# Patient Record
Sex: Female | Born: 1955 | Race: White | Hispanic: No | Smoking: Current every day smoker
Health system: Southern US, Community
[De-identification: ages and names within clinical notes are randomized; demographics above are authoritative.]

## PROBLEM LIST (undated history)

## (undated) DIAGNOSIS — R112 Nausea with vomiting, unspecified: Secondary | ICD-10-CM

## (undated) DIAGNOSIS — G2581 Restless legs syndrome: Secondary | ICD-10-CM

## (undated) DIAGNOSIS — I639 Cerebral infarction, unspecified: Secondary | ICD-10-CM

## (undated) DIAGNOSIS — I10 Essential (primary) hypertension: Secondary | ICD-10-CM

## (undated) DIAGNOSIS — Z992 Dependence on renal dialysis: Secondary | ICD-10-CM

## (undated) DIAGNOSIS — J9611 Chronic respiratory failure with hypoxia: Secondary | ICD-10-CM

## (undated) DIAGNOSIS — Z9889 Other specified postprocedural states: Secondary | ICD-10-CM

## (undated) DIAGNOSIS — D649 Anemia, unspecified: Secondary | ICD-10-CM

## (undated) DIAGNOSIS — E119 Type 2 diabetes mellitus without complications: Secondary | ICD-10-CM

## (undated) DIAGNOSIS — I739 Peripheral vascular disease, unspecified: Secondary | ICD-10-CM

## (undated) DIAGNOSIS — N189 Chronic kidney disease, unspecified: Secondary | ICD-10-CM

## (undated) DIAGNOSIS — I2510 Atherosclerotic heart disease of native coronary artery without angina pectoris: Secondary | ICD-10-CM

## (undated) DIAGNOSIS — N186 End stage renal disease: Secondary | ICD-10-CM

## (undated) DIAGNOSIS — E785 Hyperlipidemia, unspecified: Secondary | ICD-10-CM

## (undated) HISTORY — PX: WRIST SURGERY: SHX841

## (undated) HISTORY — PX: TONSILLECTOMY: SUR1361

## (undated) HISTORY — DX: Chronic kidney disease, unspecified: N18.9

## (undated) HISTORY — PX: UPPER GI ENDOSCOPY: SHX6162

---

## 1997-11-04 ENCOUNTER — Other Ambulatory Visit: Admission: RE | Admit: 1997-11-04 | Discharge: 1997-11-04 | Payer: Self-pay | Admitting: Obstetrics and Gynecology

## 1997-11-10 ENCOUNTER — Ambulatory Visit (HOSPITAL_COMMUNITY): Admission: RE | Admit: 1997-11-10 | Discharge: 1997-11-10 | Payer: Self-pay | Admitting: Obstetrics and Gynecology

## 2001-11-07 ENCOUNTER — Encounter: Payer: Self-pay | Admitting: Family Medicine

## 2001-11-07 ENCOUNTER — Ambulatory Visit (HOSPITAL_COMMUNITY): Admission: RE | Admit: 2001-11-07 | Discharge: 2001-11-07 | Payer: Self-pay | Admitting: Family Medicine

## 2009-04-28 ENCOUNTER — Emergency Department (HOSPITAL_COMMUNITY): Admission: EM | Admit: 2009-04-28 | Discharge: 2009-04-29 | Payer: Self-pay | Admitting: Emergency Medicine

## 2009-04-29 ENCOUNTER — Encounter (INDEPENDENT_AMBULATORY_CARE_PROVIDER_SITE_OTHER): Payer: Self-pay | Admitting: Emergency Medicine

## 2009-04-29 ENCOUNTER — Ambulatory Visit: Payer: Self-pay | Admitting: Vascular Surgery

## 2009-04-29 ENCOUNTER — Ambulatory Visit (HOSPITAL_COMMUNITY): Admission: RE | Admit: 2009-04-29 | Discharge: 2009-04-29 | Payer: Self-pay | Admitting: Emergency Medicine

## 2010-04-21 ENCOUNTER — Emergency Department (HOSPITAL_COMMUNITY): Admission: EM | Admit: 2010-04-21 | Discharge: 2010-04-21 | Payer: Self-pay | Admitting: Emergency Medicine

## 2010-05-25 ENCOUNTER — Ambulatory Visit (HOSPITAL_COMMUNITY): Admission: RE | Admit: 2010-05-25 | Discharge: 2010-05-26 | Payer: Self-pay | Admitting: Otolaryngology

## 2010-05-25 ENCOUNTER — Encounter (INDEPENDENT_AMBULATORY_CARE_PROVIDER_SITE_OTHER): Payer: Self-pay | Admitting: Otolaryngology

## 2010-11-22 LAB — COMPREHENSIVE METABOLIC PANEL WITH GFR
ALT: 17 U/L (ref 0–35)
AST: 19 U/L (ref 0–37)
Albumin: 3.8 g/dL (ref 3.5–5.2)
Alkaline Phosphatase: 125 U/L — ABNORMAL HIGH (ref 39–117)
BUN: 15 mg/dL (ref 6–23)
CO2: 26 meq/L (ref 19–32)
Calcium: 9.1 mg/dL (ref 8.4–10.5)
Chloride: 100 meq/L (ref 96–112)
Creatinine, Ser: 0.85 mg/dL (ref 0.4–1.2)
GFR calc non Af Amer: >60 mL/min
Glucose, Bld: 195 mg/dL — ABNORMAL HIGH (ref 70–99)
Potassium: 3.6 meq/L (ref 3.5–5.1)
Sodium: 135 meq/L (ref 135–145)
Total Bilirubin: 0.4 mg/dL (ref 0.3–1.2)
Total Protein: 7.1 g/dL (ref 6.0–8.3)

## 2010-11-22 LAB — URINALYSIS, ROUTINE W REFLEX MICROSCOPIC
Bilirubin Urine: NEGATIVE
Glucose, UA: NEGATIVE mg/dL
Ketones, ur: NEGATIVE mg/dL
Nitrite: NEGATIVE
Protein, ur: NEGATIVE mg/dL
Specific Gravity, Urine: 1.025 (ref 1.005–1.030)
Urobilinogen, UA: 0.2 mg/dL (ref 0.0–1.0)
pH: 5.5 (ref 5.0–8.0)

## 2010-11-22 LAB — CBC
HCT: 42.8 % (ref 36.0–46.0)
Hemoglobin: 14.7 g/dL (ref 12.0–15.0)
MCH: 29.4 pg (ref 26.0–34.0)
MCHC: 34.3 g/dL (ref 30.0–36.0)
MCV: 85.6 fL (ref 78.0–100.0)
Platelets: 262 10*3/uL (ref 150–400)
RBC: 5 MIL/uL (ref 3.87–5.11)
RDW: 14.1 % (ref 11.5–15.5)
WBC: 14.3 10*3/uL — ABNORMAL HIGH (ref 4.0–10.5)

## 2010-11-22 LAB — SURGICAL PCR SCREEN
MRSA, PCR: NEGATIVE
Staphylococcus aureus: NEGATIVE

## 2010-11-22 LAB — URINE MICROSCOPIC-ADD ON

## 2010-12-15 LAB — BASIC METABOLIC PANEL
GFR calc Af Amer: >60 mL/min (ref >60)
GFR calc non Af Amer: >60 mL/min (ref >60)
Glucose, Bld: 180 mg/dL — ABNORMAL HIGH (ref 70–99)
Potassium: 3.7 mEq/L (ref 3.5–5.1)
Sodium: 137 mEq/L (ref 135–145)

## 2010-12-15 LAB — POCT CARDIAC MARKERS
Myoglobin, poc: 38.5 ng/mL (ref 12–200)
Troponin i, poc: <0.05 ng/mL (ref 0.00–0.09)

## 2010-12-15 LAB — DIFFERENTIAL
Eosinophils Relative: 2 % (ref 0–5)
Lymphocytes Relative: 22 % (ref 12–46)
Lymphs Abs: 2.4 10*3/uL (ref 0.7–4.0)
Monocytes Absolute: 0.6 10*3/uL (ref 0.1–1.0)

## 2010-12-15 LAB — CBC
HCT: 39.2 % (ref 36.0–46.0)
Hemoglobin: 13.3 g/dL (ref 12.0–15.0)
RBC: 4.47 MIL/uL (ref 3.87–5.11)
WBC: 11.2 10*3/uL — ABNORMAL HIGH (ref 4.0–10.5)

## 2010-12-15 LAB — APTT: aPTT: 26 seconds (ref 24–37)

## 2010-12-19 ENCOUNTER — Inpatient Hospital Stay (INDEPENDENT_AMBULATORY_CARE_PROVIDER_SITE_OTHER)
Admission: RE | Admit: 2010-12-19 | Discharge: 2010-12-19 | Disposition: A | Discharge status: Home / Self Care | Payer: Self-pay | Source: Ambulatory Visit | Attending: Emergency Medicine | Admitting: Emergency Medicine

## 2010-12-19 DIAGNOSIS — I10 Essential (primary) hypertension: Secondary | ICD-10-CM

## 2013-10-22 ENCOUNTER — Encounter (HOSPITAL_COMMUNITY): Payer: Self-pay | Admitting: Emergency Medicine

## 2013-10-22 ENCOUNTER — Inpatient Hospital Stay (HOSPITAL_COMMUNITY)
Admission: EM | Admit: 2013-10-22 | Discharge: 2013-10-25 | DRG: 062 | Disposition: A | Discharge status: Home / Self Care | Payer: Self-pay | Attending: Neurology | Admitting: Neurology

## 2013-10-22 ENCOUNTER — Emergency Department (HOSPITAL_COMMUNITY): Payer: Self-pay

## 2013-10-22 ENCOUNTER — Inpatient Hospital Stay (HOSPITAL_COMMUNITY): Payer: Self-pay

## 2013-10-22 DIAGNOSIS — I6789 Other cerebrovascular disease: Secondary | ICD-10-CM | POA: Diagnosis present

## 2013-10-22 DIAGNOSIS — E119 Type 2 diabetes mellitus without complications: Secondary | ICD-10-CM | POA: Diagnosis present

## 2013-10-22 DIAGNOSIS — R29810 Facial weakness: Secondary | ICD-10-CM | POA: Diagnosis present

## 2013-10-22 DIAGNOSIS — Z8249 Family history of ischemic heart disease and other diseases of the circulatory system: Secondary | ICD-10-CM

## 2013-10-22 DIAGNOSIS — I6350 Cerebral infarction due to unspecified occlusion or stenosis of unspecified cerebral artery: Secondary | ICD-10-CM

## 2013-10-22 DIAGNOSIS — Z823 Family history of stroke: Secondary | ICD-10-CM

## 2013-10-22 DIAGNOSIS — G8190 Hemiplegia, unspecified affecting unspecified side: Secondary | ICD-10-CM | POA: Diagnosis present

## 2013-10-22 DIAGNOSIS — R739 Hyperglycemia, unspecified: Secondary | ICD-10-CM

## 2013-10-22 DIAGNOSIS — E785 Hyperlipidemia, unspecified: Secondary | ICD-10-CM | POA: Diagnosis present

## 2013-10-22 DIAGNOSIS — I6330 Cerebral infarction due to thrombosis of unspecified cerebral artery: Principal | ICD-10-CM | POA: Diagnosis present

## 2013-10-22 DIAGNOSIS — Z8673 Personal history of transient ischemic attack (TIA), and cerebral infarction without residual deficits: Secondary | ICD-10-CM | POA: Diagnosis present

## 2013-10-22 DIAGNOSIS — R471 Dysarthria and anarthria: Secondary | ICD-10-CM | POA: Diagnosis present

## 2013-10-22 DIAGNOSIS — I639 Cerebral infarction, unspecified: Secondary | ICD-10-CM

## 2013-10-22 DIAGNOSIS — F17200 Nicotine dependence, unspecified, uncomplicated: Secondary | ICD-10-CM | POA: Diagnosis present

## 2013-10-22 DIAGNOSIS — I10 Essential (primary) hypertension: Secondary | ICD-10-CM

## 2013-10-22 HISTORY — DX: Essential (primary) hypertension: I10

## 2013-10-22 LAB — COMPREHENSIVE METABOLIC PANEL
ALBUMIN: 3.6 g/dL (ref 3.5–5.2)
ALK PHOS: 133 U/L — AB (ref 39–117)
ALT: 13 U/L (ref 0–35)
AST: 14 U/L (ref 0–37)
BUN: 17 mg/dL (ref 6–23)
CHLORIDE: 98 meq/L (ref 96–112)
CO2: 22 mEq/L (ref 19–32)
Calcium: 9.3 mg/dL (ref 8.4–10.5)
Creatinine, Ser: 0.95 mg/dL (ref 0.50–1.10)
GFR calc Af Amer: 76 mL/min — ABNORMAL LOW (ref >90)
GFR calc non Af Amer: 66 mL/min — ABNORMAL LOW (ref >90)
GLUCOSE: 216 mg/dL — AB (ref 70–99)
POTASSIUM: 3.9 meq/L (ref 3.7–5.3)
SODIUM: 138 meq/L (ref 137–147)
TOTAL PROTEIN: 7.8 g/dL (ref 6.0–8.3)
Total Bilirubin: 0.3 mg/dL (ref 0.3–1.2)

## 2013-10-22 LAB — URINALYSIS, ROUTINE W REFLEX MICROSCOPIC
BILIRUBIN URINE: NEGATIVE
Glucose, UA: NEGATIVE mg/dL
KETONES UR: NEGATIVE mg/dL
NITRITE: NEGATIVE
Protein, ur: 100 mg/dL — AB
SPECIFIC GRAVITY, URINE: 1.013 (ref 1.005–1.030)
UROBILINOGEN UA: 1 mg/dL (ref 0.0–1.0)
pH: 7 (ref 5.0–8.0)

## 2013-10-22 LAB — ETHANOL: Alcohol, Ethyl (B): <11 mg/dL (ref 0–11)

## 2013-10-22 LAB — CBC
HCT: 41.6 % (ref 36.0–46.0)
Hemoglobin: 14.5 g/dL (ref 12.0–15.0)
MCH: 29.7 pg (ref 26.0–34.0)
MCHC: 34.9 g/dL (ref 30.0–36.0)
MCV: 85.1 fL (ref 78.0–100.0)
PLATELETS: 280 10*3/uL (ref 150–400)
RBC: 4.89 MIL/uL (ref 3.87–5.11)
RDW: 13.3 % (ref 11.5–15.5)
WBC: 13.1 10*3/uL — ABNORMAL HIGH (ref 4.0–10.5)

## 2013-10-22 LAB — GLUCOSE, CAPILLARY: Glucose-Capillary: 213 mg/dL — ABNORMAL HIGH (ref 70–99)

## 2013-10-22 LAB — DIFFERENTIAL
BASOS PCT: 0 % (ref 0–1)
Basophils Absolute: 0 10*3/uL (ref 0.0–0.1)
EOS ABS: 0.1 10*3/uL (ref 0.0–0.7)
Eosinophils Relative: 1 % (ref 0–5)
LYMPHS ABS: 2.4 10*3/uL (ref 0.7–4.0)
Lymphocytes Relative: 18 % (ref 12–46)
Monocytes Absolute: 0.7 10*3/uL (ref 0.1–1.0)
Monocytes Relative: 5 % (ref 3–12)
NEUTROS ABS: 9.8 10*3/uL — AB (ref 1.7–7.7)
NEUTROS PCT: 75 % (ref 43–77)

## 2013-10-22 LAB — POCT I-STAT TROPONIN I: Troponin i, poc: 0 ng/mL (ref 0.00–0.08)

## 2013-10-22 LAB — POCT I-STAT, CHEM 8
BUN: 20 mg/dL (ref 6–23)
CREATININE: 1 mg/dL (ref 0.50–1.10)
Calcium, Ion: 1.13 mmol/L (ref 1.12–1.23)
Chloride: 102 mEq/L (ref 96–112)
GLUCOSE: 214 mg/dL — AB (ref 70–99)
HCT: 44 % (ref 36.0–46.0)
HEMOGLOBIN: 15 g/dL (ref 12.0–15.0)
Potassium: 3.8 mEq/L (ref 3.7–5.3)
SODIUM: 140 meq/L (ref 137–147)
TCO2: 26 mmol/L (ref 0–100)

## 2013-10-22 LAB — URINE MICROSCOPIC-ADD ON

## 2013-10-22 LAB — PROTIME-INR
INR: 0.92 (ref 0.00–1.49)
PROTHROMBIN TIME: 12.2 s (ref 11.6–15.2)

## 2013-10-22 LAB — RAPID URINE DRUG SCREEN, HOSP PERFORMED
Amphetamines: NOT DETECTED
Barbiturates: NOT DETECTED
Benzodiazepines: NOT DETECTED
COCAINE: NOT DETECTED
Opiates: NOT DETECTED
Tetrahydrocannabinol: POSITIVE — AB

## 2013-10-22 LAB — MRSA PCR SCREENING: MRSA BY PCR: NEGATIVE

## 2013-10-22 LAB — APTT: APTT: 29 s (ref 24–37)

## 2013-10-22 LAB — TROPONIN I: Troponin I: <0.3 ng/mL (ref <0.30)

## 2013-10-22 MED ORDER — LABETALOL HCL 5 MG/ML IV SOLN
10.0000 mg | INTRAVENOUS | Status: DC | PRN
  Administered 2013-10-22 – 2013-10-25 (×5): 10 mg via INTRAVENOUS
  Filled 2013-10-22 (×5): qty 4

## 2013-10-22 MED ORDER — PANTOPRAZOLE SODIUM 40 MG IV SOLR
40.0000 mg | Freq: Every day | INTRAVENOUS | Status: DC
Start: 2013-10-22 — End: 2013-10-24
  Administered 2013-10-22 – 2013-10-23 (×2): 40 mg via INTRAVENOUS
  Filled 2013-10-22 (×3): qty 40

## 2013-10-22 MED ORDER — ACETAMINOPHEN 650 MG RE SUPP: 650.0000 mg | RECTAL | Status: DC | PRN

## 2013-10-22 MED ORDER — ACETAMINOPHEN 325 MG PO TABS
650.0000 mg | ORAL_TABLET | ORAL | Status: DC | PRN
  Administered 2013-10-23: 650 mg via ORAL
  Filled 2013-10-22: qty 2

## 2013-10-22 MED ORDER — ALTEPLASE (STROKE) FULL DOSE INFUSION
90.0000 mg | Freq: Once | INTRAVENOUS | Status: AC
  Administered 2013-10-22: 90 mg via INTRAVENOUS
  Filled 2013-10-22: qty 90

## 2013-10-22 MED ORDER — SODIUM CHLORIDE 0.9 % IV SOLN
INTRAVENOUS | Status: DC
  Administered 2013-10-22 – 2013-10-23 (×2): via INTRAVENOUS

## 2013-10-22 MED ORDER — DIPHENHYDRAMINE HCL 50 MG/ML IJ SOLN: 12.5000 mg | Freq: Four times a day (QID) | INTRAMUSCULAR | Status: DC | PRN

## 2013-10-22 MED ORDER — SENNOSIDES-DOCUSATE SODIUM 8.6-50 MG PO TABS
1.0000 | ORAL_TABLET | Freq: Every evening | ORAL | Status: DC | PRN
  Filled 2013-10-22: qty 1

## 2013-10-22 MED ORDER — PNEUMOCOCCAL VAC POLYVALENT 25 MCG/0.5ML IJ INJ
0.5000 mL | INJECTION | INTRAMUSCULAR | Status: AC
  Administered 2013-10-23: 0.5 mL via INTRAMUSCULAR
  Filled 2013-10-22: qty 0.5

## 2013-10-22 MED ORDER — LABETALOL HCL 5 MG/ML IV SOLN
20.0000 mg | Freq: Once | INTRAVENOUS | Status: AC
  Administered 2013-10-22: 20 mg via INTRAVENOUS
  Filled 2013-10-22: qty 4

## 2013-10-22 MED ORDER — DIPHENHYDRAMINE HCL 50 MG/ML IJ SOLN
12.5000 mg | Freq: Four times a day (QID) | INTRAMUSCULAR | Status: DC | PRN
  Administered 2013-10-22: 12.5 mg via INTRAVENOUS
  Filled 2013-10-22: qty 1

## 2013-10-22 MED ORDER — NICARDIPINE HCL IN NACL 20-0.86 MG/200ML-% IV SOLN
3.0000 mg/h | INTRAVENOUS | Status: DC
  Administered 2013-10-23: 3 mg/h via INTRAVENOUS
  Administered 2013-10-23: 5 mg/h via INTRAVENOUS
  Administered 2013-10-24: 2 mg/h via INTRAVENOUS
  Filled 2013-10-22 (×3): qty 200

## 2013-10-22 NOTE — ED Notes (Signed)
Per ems, pt BP was 240/120. No HX of stroke or TIA

## 2013-10-22 NOTE — H&P (Signed)
Referring Physician: Ward    Chief Complaint: Code Stroke  HPI:                                                                                                                                         Tina Gomez is an 58 y.o. female who has not seen a medical MD for over one year.  Today she was sitting in her living room when she tries to write something down. She noted her writing was scribbled and then noted a fullness sensation in her right face, blurred vision and abnormal speech.  Family members arrived at the house and noted her speech was abnormal and she had a facial droop.  EMS was called and code stroke was called.  En route her BP was 240/119, BG 210.  On arrival BP remained elevated at 214/109, she continued to have dysarthria, right arm drift , decreased sensation in right arm and face.   Date last known well: Date: 10/22/2013 Time last known well: Time: 13:00 tPA Given: Yes NIHSS 6  Past Medical History  Diagnosis Date  . Hypertension     Past Surgical History  Procedure Laterality Date  . Cesarean section      Family History  Problem Relation Age of Onset  . Stroke Mother   . Hypertension Mother   . Hypertension Father   . Hyperlipidemia Mother   . Hyperlipidemia Father    Social History:  reports that she has been smoking Cigarettes.  She has a 10 pack-year smoking history. She does not have any smokeless tobacco history on file. She reports that she does not drink alcohol. Her drug history is not on file.  Allergies:  Allergies  Allergen Reactions  . Codeine     Medications:                                                                                                                          none  ROS:  History obtained from the patient  General ROS: negative for - chills, fatigue, fever, night sweats, weight gain or  weight loss Psychological ROS: negative for - behavioral disorder, hallucinations, memory difficulties, mood swings or suicidal ideation Ophthalmic ROS: negative for - blurry vision, double vision, eye pain or loss of vision ENT ROS: negative for - epistaxis, nasal discharge, oral lesions, sore throat, tinnitus or vertigo Allergy and Immunology ROS: negative for - hives or itchy/watery eyes Hematological and Lymphatic ROS: negative for - bleeding problems, bruising or swollen lymph nodes Endocrine ROS: negative for - galactorrhea, hair pattern changes, polydipsia/polyuria or temperature intolerance Respiratory ROS: negative for - cough, hemoptysis, shortness of breath or wheezing Cardiovascular ROS: negative for - chest pain, dyspnea on exertion, edema or irregular heartbeat Gastrointestinal ROS: negative for - abdominal pain, diarrhea, hematemesis, nausea/vomiting or stool incontinence Genito-Urinary ROS: negative for - dysuria, hematuria, incontinence or urinary frequency/urgency Musculoskeletal ROS: negative for - joint swelling or muscular weakness Neurological ROS: as noted in HPI Dermatological ROS: negative for rash and skin lesion changes  General Exam: CV-RRR S1,S2 Skin: WDI ABD: BSX4 quadrants Lungs: CTA  Neurologic Examination:                                                                                                      Blood pressure 177/86, pulse 68, resp. rate 17, height 5\' 3"  (1.6 m), weight 106.595 kg (235 lb), SpO2 94.00%.   Mental Status: Alert, oriented, thought content appropriate.  Speech dysarthric without evidence of aphasia.  Able to follow 3 step commands without difficulty. Cranial Nerves: II: Discs flat bilaterally; Visual fields grossly normal, pupils equal, round, reactive to light and accommodation III,IV, VI: ptosis present in right eye, extra-ocular motions intact but not conjugate V,VII: mild right facial droop, facial light touch sensation  decreased on the right VIII: hearing normal bilaterally IX,X: gag reflex present XI: bilateral shoulder shrug XII: midline tongue extension without atrophy or fasciculations Motor: Right : Upper extremity   4/5    Left:     Upper extremity   5/5  Lower extremity   4/5     Lower extremity   5/5 --drift in right arm and leg Tone and bulk:normal tone throughout; no atrophy noted Sensory:  Decreased in right leg and arm Deep Tendon Reflexes:  Right: Upper Extremity   Left: Upper extremity   biceps (C-5 to C-6) 2/4   biceps (C-5 to C-6) 2/4 tricep (C7) 2/4    triceps (C7) 2/4 Brachioradialis (C6) 2/4  Brachioradialis (C6) 2/4  Lower Extremity Lower Extremity  quadriceps (L-2 to L-4) 2/4   quadriceps (L-2 to L-4) 2/4 Achilles (S1) 1/4   Achilles (S1) 1/4 Plantars: Right: downgoing   Left: downgoing Cerebellar: normal finger-to-nose,  normal heel-to-shin test Gait: not tested CV: pulses palpable throughout    Lab Results: Basic Metabolic Panel:  Recent Labs Lab 10/22/13 1614  NA 140  K 3.8  CL 102  GLUCOSE 214*  BUN 20  CREATININE 1.00    Liver Function Tests: No results found for this basename: AST, ALT, ALKPHOS,  BILITOT, PROT, ALBUMIN,  in the last 168 hours No results found for this basename: LIPASE, AMYLASE,  in the last 168 hours No results found for this basename: AMMONIA,  in the last 168 hours  CBC:  Recent Labs Lab 10/22/13 1554 10/22/13 1614  WBC 13.1*  --   NEUTROABS 9.8*  --   HGB 14.5 15.0  HCT 41.6 44.0  MCV 85.1  --   PLT 280  --     Cardiac Enzymes: No results found for this basename: CKTOTAL, CKMB, CKMBINDEX, TROPONINI,  in the last 168 hours  Lipid Panel: No results found for this basename: CHOL, TRIG, HDL, CHOLHDL, VLDL, LDLCALC,  in the last 168 hours  CBG:  Recent Labs Lab 10/22/13 [place]*    Microbiology: Results for orders placed during the hospital encounter of 05/25/10  SURGICAL PCR SCREEN     Status: None    Collection Time    05/23/10  3:30 PM      Result Value Ref Range Status   MRSA, PCR NEGATIVE  NEGATIVE Final   Staphylococcus aureus    NEGATIVE Final   Value: NEGATIVE            The Xpert SA Assay (FDA     approved for NASAL specimens     only), is one component of     a comprehensive surveillance     program.  It is not intended     to diagnose infection nor to     guide or monitor treatment.    Coagulation Studies:  Recent Labs  10/22/13 1554  LABPROT 12.2  INR 0.92    Imaging: Ct Head Wo Contrast  10/22/2013   CLINICAL DATA:  Code stroke, sudden onset of right-sided facial droop and arm dressed  EXAM: CT HEAD WITHOUT CONTRAST  TECHNIQUE: Contiguous axial images were obtained from the base of the skull through the vertex without intravenous contrast.  COMPARISON:  None.  FINDINGS: Scattered very minimal periventricular hypodensities suggestive of microvascular ischemic disease. The gray-white differentiation is otherwise well maintained without CT evidence of acute large territory infarct. No intraparenchymal or extra-axial mass or hemorrhage. Normal size and configuration of the ventricles and basilar cisterns. No midline shift. Minimal intracranial atherosclerosis. Limited visualization of the paranasal sinuses and mastoid air cells are normal. Regional soft tissues are normal. No displaced sternal fracture.  IMPRESSION: Minimal microvascular ischemic disease without acute intracranial process.  Critical Value/emergent results were called by telephone at the time of interpretation on 10/22/2013 at 3:46 PM to Dr. Doy Mince, who verbally acknowledged these results.   Electronically Signed   By: Sandi Mariscal M.D.   On: 10/22/2013 15:53    Etta Quill PA-C [facility] [phone]  10/22/2013, 4:44 PM   Patient seen and examined.  Clinical course and management discussed.  Necessary edits performed.  I agree with the above.  Assessment and plan of care developed and  discussed below.     Assessment: 58 y.o. female presenting with complaints of slurred speech and right hemiparesis.  Patient with a history of hypertension on no antihypertensives.  BP elevated on presentation.  Head CT reviewed and shows no evidence of acute changes.  Patient with an NIHSS of 6.  Symptoms affect right and patient is right handed.  Option of tPA discussed along with possible risks and benefits.  Verbal consent obtained.  tPA administered after control of BP.    Stroke Risk Factors - hypertension and smoking  1. HgbA1c,  fasting lipid panel 2. MRI, MRA  of the brain without contrast 3. PT consult, OT consult, Speech consult 4. Echocardiogram 5. Carotid dopplers 6. Prophylactic therapy-None 7. BP control 8. Telemetry monitoring 9. Frequent neuro checks 10. Repeat head CT in 24 hours 11. Admission to 15M 12. Smoking cessation counseling  This patient is critically ill and at significant risk of neurological worsening, death and care requires constant monitoring of vital signs, hemodynamics,respiratory and cardiac monitoring, neurological assessment, discussion with family, other specialists and medical decision making of high complexity. I spent 75 minutes of neurocritical care time  in the care of  this patient.  Alexis Goodell, MD [facility] [phone] 10/22/2013  4:51 PM    Alexis Goodell, MD [facility] [phone]  10/22/2013  4:45 PM

## 2013-10-22 NOTE — ED Notes (Signed)
Pt has reddened rash all over left hand. Pt denies pain or itchiness to area.

## 2013-10-22 NOTE — Consult Note (Deleted)
Referring Physician: Ward    Chief Complaint: Code Stroke  HPI:                                                                                                                                         Tina Gomez is an 58 y.o. female who has not seen a medical MD for over one year.  Today she was sitting in her living room when she tries to write something down. She noted her writing was scribbled and then noted a fullness sensation in her right face, blurred vision and abnormal speech.  Family members arrived at the house and noted her speech was abnormal and she had a facial droop.  EMS was called and code stroke was called.  En route her BP was 240/119, BG 210.  On arrival BP remained elevated at 214/109, she continued to have dysarthria, right arm drift , decreased sensation in right arm and face.   Date last known well: Date: 10/22/2013 Time last known well: Time: 13:00 tPA Given: Yes NIHSS 6  Past Medical History  Diagnosis Date  . Hypertension     Past Surgical History  Procedure Laterality Date  . Cesarean section      Family History  Problem Relation Age of Onset  . Stroke Mother   . Hypertension Mother   . Hypertension Father   . Hyperlipidemia Mother   . Hyperlipidemia Father    Social History:  reports that she has been smoking Cigarettes.  She has a 10 pack-year smoking history. She does not have any smokeless tobacco history on file. She reports that she does not drink alcohol. Her drug history is not on file.  Allergies:  Allergies  Allergen Reactions  . Codeine     Medications:                                                                                                                          none  ROS:  History obtained from the patient  General ROS: negative for - chills, fatigue, fever, night sweats, weight gain or  weight loss Psychological ROS: negative for - behavioral disorder, hallucinations, memory difficulties, mood swings or suicidal ideation Ophthalmic ROS: negative for - blurry vision, double vision, eye pain or loss of vision ENT ROS: negative for - epistaxis, nasal discharge, oral lesions, sore throat, tinnitus or vertigo Allergy and Immunology ROS: negative for - hives or itchy/watery eyes Hematological and Lymphatic ROS: negative for - bleeding problems, bruising or swollen lymph nodes Endocrine ROS: negative for - galactorrhea, hair pattern changes, polydipsia/polyuria or temperature intolerance Respiratory ROS: negative for - cough, hemoptysis, shortness of breath or wheezing Cardiovascular ROS: negative for - chest pain, dyspnea on exertion, edema or irregular heartbeat Gastrointestinal ROS: negative for - abdominal pain, diarrhea, hematemesis, nausea/vomiting or stool incontinence Genito-Urinary ROS: negative for - dysuria, hematuria, incontinence or urinary frequency/urgency Musculoskeletal ROS: negative for - joint swelling or muscular weakness Neurological ROS: as noted in HPI Dermatological ROS: negative for rash and skin lesion changes  Neurologic Examination:                                                                                                      Blood pressure 214/115, pulse 70, resp. rate 18, height 5\' 3"  (1.6 m), weight 106.595 kg (235 lb), SpO2 100.00%.   Mental Status: Alert, oriented, thought content appropriate.  Speech dysarthric without evidence of aphasia.  Able to follow 3 step commands without difficulty. Cranial Nerves: II: Discs flat bilaterally; Visual fields grossly normal, pupils equal, round, reactive to light and accommodation III,IV, VI: ptosis present in right eye, extra-ocular motions intact but not conjugate V,VII: smile symmetric, facial light touch sensation normal bilaterally VIII: hearing normal bilaterally IX,X: gag reflex present XI:  bilateral shoulder shrug XII: midline tongue extension without atrophy or fasciculations  Motor: Right : Upper extremity   4/5    Left:     Upper extremity   5/5  Lower extremity   4/5     Lower extremity   5/5 --drift in right arm and leg Tone and bulk:normal tone throughout; no atrophy noted Sensory:  Decreased in right leg and arm Deep Tendon Reflexes:  Right: Upper Extremity   Left: Upper extremity   biceps (C-5 to C-6) 2/4   biceps (C-5 to C-6) 2/4 tricep (C7) 2/4    triceps (C7) 2/4 Brachioradialis (C6) 2/4  Brachioradialis (C6) 2/4  Lower Extremity Lower Extremity  quadriceps (L-2 to L-4) 2/4   quadriceps (L-2 to L-4) 2/4 Achilles (S1) 1/4   Achilles (S1) 1/4  Plantars: Right: downgoing   Left: downgoing Cerebellar: normal finger-to-nose,  normal heel-to-shin test Gait: not tested CV: pulses palpable throughout    Lab Results: Basic Metabolic Panel: No results found for this basename: NA, K, CL, CO2, GLUCOSE, BUN, CREATININE, CALCIUM, MG, PHOS,  in the last 168 hours  Liver Function Tests: No results found for this basename: AST, ALT, ALKPHOS, BILITOT, PROT, ALBUMIN,  in the last 168 hours No results found for this basename: LIPASE, AMYLASE,  in the last 168 hours No results found for this basename: AMMONIA,  in the last 168 hours  CBC:  Recent Labs Lab 10/22/13 1554  WBC 13.1*  NEUTROABS 9.8*  HGB 14.5  HCT 41.6  MCV 85.1  PLT 280    Cardiac Enzymes: No results found for this basename: CKTOTAL, CKMB, CKMBINDEX, TROPONINI,  in the last 168 hours  Lipid Panel: No results found for this basename: CHOL, TRIG, HDL, CHOLHDL, VLDL, LDLCALC,  in the last 168 hours  CBG: No results found for this basename: GLUCAP,  in the last 168 hours  Microbiology: Results for orders placed during the hospital encounter of 05/25/10  SURGICAL PCR SCREEN     Status: None   Collection Time    05/23/10  3:30 PM      Result Value Ref Range Status   MRSA, PCR NEGATIVE   NEGATIVE Final   Staphylococcus aureus    NEGATIVE Final   Value: NEGATIVE            The Xpert SA Assay (FDA     approved for NASAL specimens     only), is one component of     a comprehensive surveillance     program.  It is not intended     to diagnose infection nor to     guide or monitor treatment.    Coagulation Studies: No results found for this basename: LABPROT, INR,  in the last 72 hours  Imaging: Ct Head Wo Contrast  10/22/2013   CLINICAL DATA:  Code stroke, sudden onset of right-sided facial droop and arm dressed  EXAM: CT HEAD WITHOUT CONTRAST  TECHNIQUE: Contiguous axial images were obtained from the base of the skull through the vertex without intravenous contrast.  COMPARISON:  None.  FINDINGS: Scattered very minimal periventricular hypodensities suggestive of microvascular ischemic disease. The gray-white differentiation is otherwise well maintained without CT evidence of acute large territory infarct. No intraparenchymal or extra-axial mass or hemorrhage. Normal size and configuration of the ventricles and basilar cisterns. No midline shift. Minimal intracranial atherosclerosis. Limited visualization of the paranasal sinuses and mastoid air cells are normal. Regional soft tissues are normal. No displaced sternal fracture.  IMPRESSION: Minimal microvascular ischemic disease without acute intracranial process.  Critical Value/emergent results were called by telephone at the time of interpretation on 10/22/2013 at 3:46 PM to Dr. Doy Mince, who verbally acknowledged these results.   Electronically Signed   By: Sandi Mariscal M.D.   On: 10/22/2013 15:53       Assessment and plan discussed with with attending physician and they are in agreement.    Etta Quill PA-C [facility] [phone]  10/22/2013, 4:12 PM   Assessment: 58 y.o. female   Stroke Risk Factors - hypertension and smoking

## 2013-10-22 NOTE — ED Notes (Signed)
Pt. Getting ready to leave floor

## 2013-10-22 NOTE — ED Notes (Signed)
Per ems, pt was at home at 1300 LSN, started to feel dizzy, slurred speech, pt states she couldn't talk, couldn't write. Upon EMS arrival, pt was ambulatory, had right arm drift, right facial droop. Enroute, speech resolved and droop resolved.

## 2013-10-22 NOTE — Code Documentation (Signed)
58yo female arriving to [facility] via [place].  EMS reports that the patient had acute onset dizziness at 1300, then developed difficulty speaking and writing.  Patient took baby Aspirin x4.  Patient notified family of her symptoms who activated EMS.  Code stroke called by EMS for right facial droop, right arm drift and slurred speech.  Speech started to improve en route per EMS.  Patient has a h/o hypertension and is currently taking no medications.  Patient taken to CT on arrival.  NIHSS 6 on arrival, see documentation for details.  Patient with continued right facial droop, right arm and leg drift, right face and arm decreased sensation, and dysarthria.  Patient hypertensive on arrival requiring Labetalol IVP.  Patient treated with IV tPA which was started at 1618.  See code stroke log for documentation and times.  Patient frequently assessed, see documentation for details.  Report given to [place], RN on [place].   Patient developed new left hand petechial rash, but denies itching or pain.  Patient reports that she feels "sleepy."  Dr. Doy Mince made aware and to bedside to assess.  Order for Benadryl IVP.  Site marked.  Patient transported to 3M01.  Patient reassessed at bedside, BP 187/75.  RN to give Labetalol IVP per order.  Dr. Doy Mince aware.  Bedside handoff with Myriam Jacobson, RN on .

## 2013-10-22 NOTE — ED Provider Notes (Signed)
TIME SEEN: 3:45 PM  CHIEF COMPLAINT: Code Stroke  HPI: Patient is a 58 year-old female with a prior history of hypertension and diabetes, tobacco abuse who presents emergency department with onset of dizziness that started at 1 PM. Patient did notice that she was having right upper extremity weakness and was unable to write. She began having aphasia and dysarthria. Patient took 4 aspirin and a heart 3:00 called EMS. EMS reports her blood pressure was 240/120 and her glucose was 210. Patient reports she has been off medications for blood pressure for the past 3 years. She's not sure her blood pressure normally runs. Her PCP is Dr. Radford Pax with [facility].  She denies a prior history of stroke or TIA. She denies any chest pain or shortness of breath. She is not on anticoagulation.  ROS: See HPI Constitutional: no fever  Eyes: no drainage  ENT: no runny nose   Cardiovascular:  no chest pain  Resp: no SOB  GI: no vomiting GU: no dysuria Integumentary: no rash  Allergy: no hives  Musculoskeletal: no leg swelling  Neurological: She denies headache. She is having mild blurry vision. + slurred speech and right-sided weakness. ROS otherwise negative  PAST MEDICAL HISTORY/PAST SURGICAL HISTORY:  No past medical history on file.  MEDICATIONS:  Prior to Admission medications   Not on File    ALLERGIES:  Allergies not on file  SOCIAL HISTORY:  History  Substance Use Topics  . Smoking status: Not on file  . Smokeless tobacco: Not on file  . Alcohol Use: Not on file    FAMILY HISTORY: No family history on file.  EXAM: CONSTITUTIONAL: Alert and oriented and responds appropriately to questions. Well-appearing; well-nourished HEAD: Normocephalic EYES: Conjunctivae clear, PERRL ENT: normal nose; no rhinorrhea; moist mucous membranes; pharynx without lesions noted NECK: Supple, no meningismus, no LAD  CARD: RRR; S1 and S2 appreciated; no murmurs, no clicks, no rubs, no  gallops RESP: Normal chest excursion without splinting or tachypnea; breath sounds clear and equal bilaterally; no wheezes, no rhonchi, no rales,  ABD/GI: Normal bowel sounds; non-distended; soft, non-tender, no rebound, no guarding BACK:  The back appears normal and is non-tender to palpation, there is no CVA tenderness EXT: Normal ROM in all joints; non-tender to palpation; no edema; normal capillary refill; no cyanosis    SKIN: Normal color for age and race; warm NEURO: Patient has mild right-sided facial droop and right upper and lower extremity pronator   drift, sensation to light touch intact diffusely, no dysmetria finger-nose testing, normal heel-to-shin testing bilaterally, otherwise considers 2 through 12 intact; NIH stroke scale between 3 and 4 mild dysarthria but no aphasia PSYCH: The patient's mood and manner are appropriate. Grooming and personal hygiene are appropriate.  MEDICAL DECISION MAKING: patient here with possible stroke. Stroke workup pending. CT head shows no intracranial abnormality. Neurology at bedside. Patient is extremely hypertensive. Will give IV labetalol to control her blood pressure because at this time she is not a TPA candidate with her hypertension.  ED PROGRESS: Patient's blood pressure has improved with labetalol. She will receive TPA. Consented by neurology. Neurology to admit.   Patient's labs show leukocytosis which I feel is reactive. Chemistry within normal limits other than mild hyperglycemia. Troponin negative. Patient is stable.   Patient continues to be stable with mild improvement of her symptoms. Vitals stable.    CRITICAL CARE Performed by: Nyra Jabs   Total critical care time: 30 minutes  Critical care time was  exclusive of separately billable procedures and treating other patients.  Critical care was necessary to treat or prevent imminent or life-threatening deterioration.  Critical care was time spent personally by me on the  following activities: development of treatment plan with patient and/or surrogate as well as nursing, discussions with consultants, evaluation of patient's response to treatment, examination of patient, obtaining history from patient or surrogate, ordering and performing treatments and interventions, ordering and review of laboratory studies, ordering and review of radiographic studies, pulse oximetry and re-evaluation of patient's condition.   EKG Interpretation    Date/Time:  Friday October 22 2013 16:01:35 EST Ventricular Rate:  72 PR Interval:  247 QRS Duration: 97 QT Interval:  428 QTC Calculation: 468 R Axis:   54 Text Interpretation:  Sinus rhythm Prolonged PR interval Probable left atrial enlargement Confirmed by WARD  DO, KRISTEN [id number]) on 10/22/2013 4:04:56 PM             Gilliam, DO 10/22/13 1745

## 2013-10-22 NOTE — ED Notes (Signed)
Per Neurologist, pt BP must be under 180/105.

## 2013-10-22 NOTE — ED Notes (Signed)
TPA started, unable to scan barcode.

## 2013-10-23 ENCOUNTER — Inpatient Hospital Stay (HOSPITAL_COMMUNITY): Payer: Self-pay

## 2013-10-23 DIAGNOSIS — I517 Cardiomegaly: Secondary | ICD-10-CM

## 2013-10-23 LAB — LIPID PANEL
CHOLESTEROL: 204 mg/dL — AB (ref 0–200)
HDL: 36 mg/dL — ABNORMAL LOW (ref >39)
LDL Cholesterol: 126 mg/dL — ABNORMAL HIGH (ref 0–99)
Total CHOL/HDL Ratio: 5.7 RATIO
Triglycerides: 208 mg/dL — ABNORMAL HIGH (ref <150)
VLDL: 42 mg/dL — AB (ref 0–40)

## 2013-10-23 LAB — GLUCOSE, CAPILLARY
GLUCOSE-CAPILLARY: 219 mg/dL — AB (ref 70–99)
Glucose-Capillary: 128 mg/dL — ABNORMAL HIGH (ref 70–99)

## 2013-10-23 LAB — HEMOGLOBIN A1C
Hgb A1c MFr Bld: 9.4 % — ABNORMAL HIGH (ref <5.7)
MEAN PLASMA GLUCOSE: 223 mg/dL — AB (ref <117)

## 2013-10-23 MED ORDER — INSULIN ASPART 100 UNIT/ML ~~LOC~~ SOLN
4.0000 [IU] | Freq: Three times a day (TID) | SUBCUTANEOUS | Status: DC
  Administered 2013-10-23 – 2013-10-25 (×6): 4 [IU] via SUBCUTANEOUS

## 2013-10-23 MED ORDER — HYDRALAZINE HCL 20 MG/ML IJ SOLN
5.0000 mg | Freq: Four times a day (QID) | INTRAMUSCULAR | Status: DC | PRN
  Administered 2013-10-23 – 2013-10-24 (×3): 5 mg via INTRAVENOUS
  Filled 2013-10-23 (×3): qty 1

## 2013-10-23 MED ORDER — ASPIRIN 325 MG PO TABS
325.0000 mg | ORAL_TABLET | Freq: Every day | ORAL | Status: DC
  Administered 2013-10-23 – 2013-10-25 (×3): 325 mg via ORAL
  Filled 2013-10-23 (×3): qty 1

## 2013-10-23 MED ORDER — INSULIN ASPART 100 UNIT/ML ~~LOC~~ SOLN
0.0000 [IU] | Freq: Three times a day (TID) | SUBCUTANEOUS | Status: DC
  Administered 2013-10-23 – 2013-10-24 (×2): 5 [IU] via SUBCUTANEOUS
  Administered 2013-10-24: 2 [IU] via SUBCUTANEOUS
  Administered 2013-10-24 – 2013-10-25 (×3): 3 [IU] via SUBCUTANEOUS

## 2013-10-23 NOTE — Progress Notes (Signed)
PT Cancellation Note  Patient Details Name: Tina Gomez MRN: [id number] DOB: Oct 16, 1955   Cancelled Treatment:    Reason Eval/Treat Not Completed: Patient not medically ready;Medical issues which prohibited therapy. Pt on bedrest till 4pm. Will attempt to see this afternoon as time allows.   Elie Confer [place], [place] 10/23/2013, 8:45 AM

## 2013-10-23 NOTE — Progress Notes (Signed)
*  PRELIMINARY RESULTS* Vascular Ultrasound Carotid Duplex (Doppler) has been completed.  Preliminary findings: Bilateral:  1-39% ICA stenosis.  Vertebral artery flow is antegrade.      Landry Mellow, RDMS, RVT  10/23/2013, 12:28 PM

## 2013-10-23 NOTE — Progress Notes (Signed)
Dwaine Gale, PA-C Physician Assistant Signed Neurology Progress Notes Service date: 10/23/2013 7:51 AM  Stroke Team Progress Note    HISTORY Tina Gomez is a 58 y.o. female who has not seen a medical MD for over one year. On 10/22/2013 she was sitting in her living room when she tried to write something down. She noted her writing was scribbled and then noted a fullness sensation in her right face, blurred vision and abnormal speech. Family members arrived at the house and noted her speech was abnormal and she had a facial droop. EMS was called and code stroke was called. En route her BP was 240/119, BG 210. On arrival BP remained elevated at 214/109, she continued to have dysarthria, right arm drift , decreased sensation in right arm and face.    Date last known well: Date: 10/22/2013   Time last known well: Time: 13:00   tPA Given: Yes   NIHSS 6   SUBJECTIVE  She reports having some choking this morning. This apparently has been a long-standing issue even before her stroke. She has had evaluation by Dr. Erik Obey for this problem. 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 She continues to have left upper extremity weakness and difficulty writing.   OBJECTIVE Most recent Vital Signs: Filed Vitals:     10/23/13 0400  10/23/13 0500  10/23/13 0600  10/23/13 0700   BP:  192/75  176/69  156/55  156/61   Pulse:  59  62  47  55   Temp:           TempSrc:           Resp:  14  15  14  12    Height:           Weight:           SpO2:  91%  95%  94%  94%    CBG (last 3)   Recent Labs   10/22/13 1611   GLUCAP  213*      IV Fluid Intake:    .  sodium chloride  75 mL/hr at 10/23/13 0452   .  niCARDipine  Stopped (10/22/13 2000)      MEDICATIONS  .  pantoprazole (PROTONIX) IV   40 mg  Intravenous  QHS   .  pneumococcal 23 valent vaccine   0.5 mL  Intramuscular  Tomorrow-1000    PRN:  acetaminophen,  acetaminophen, diphenhydrAMINE, labetalol, senna-docusate  Diet:  Cardiac thin liquids Activity:  Bedrest DVT Prophylaxis:  SCDs   CLINICALLY SIGNIFICANT STUDIES Basic Metabolic Panel:  Recent Labs Lab  10/22/13 1554  10/22/13 1614   NA  138  140   K  3.9  3.8   CL  98  102   CO2  22   --    GLUCOSE  216*  214*   BUN  17  20   CREATININE  0.95  1.00   CALCIUM  9.3   --     Liver Function Tests:  Recent Labs Lab  10/22/13 1554   AST  14   ALT  13   ALKPHOS  133*   BILITOT  0.3   PROT  7.8   ALBUMIN  3.6    CBC:  Recent Labs Lab  10/22/13 1554  10/22/13 1614   WBC  13.1*   --    NEUTROABS  9.8*   --  HGB  14.5  15.0   HCT  41.6  44.0   MCV  85.1   --    PLT  280   --     Coagulation:  Recent Labs Lab  10/22/13 1554   LABPROT  12.2   INR  0.92    Cardiac Enzymes:  Recent Labs Lab  10/22/13 1554   TROPONINI  <0.30    Urinalysis:  Recent Labs Lab  10/22/13 1626   COLORURINE  YELLOW   LABSPEC  1.013   PHURINE  7.0   GLUCOSEU  NEGATIVE   HGBUR  LARGE*   BILIRUBINUR  NEGATIVE   KETONESUR  NEGATIVE   PROTEINUR  100*   UROBILINOGEN  1.0   NITRITE  NEGATIVE   LEUKOCYTESUR  MODERATE*    Lipid Panel    Component  Value  Date/Time     CHOL  204*  10/23/2013 0250     TRIG  208*  10/23/2013 0250     HDL  36*  10/23/2013 0250     CHOLHDL  5.7  10/23/2013 0250     VLDL  42*  10/23/2013 0250     LDLCALC  126*  10/23/2013 0250    HgbA1C   No results found for this basename: HGBA1C       Urine Drug Screen:     Component  Value  Date/Time     LABOPIA  NONE DETECTED  10/22/2013 1626     COCAINSCRNUR  NONE DETECTED  10/22/2013 1626     LABBENZ  NONE DETECTED  10/22/2013 1626     AMPHETMU  NONE DETECTED  10/22/2013 1626     THCU  POSITIVE*  10/22/2013 1626     LABBARB  NONE DETECTED  10/22/2013 1626      Alcohol Level:  Recent Labs Lab  10/22/13 1554   ETH  <11      Ct Head Wo Contrast 10/22/2013     Minimal microvascular ischemic disease  without acute intracranial process.         Dg Chest Port 1 View 10/22/2013     COPD without acute disease.        MRI of the brain  pending   MRA of the brain  pending   2D Echocardiogram  pending   Carotid Doppler  pending     EKG  SR rate 65 BPM. For complete results please see formal report.    Therapy Recommendations pending   Physical Exam     Mental Status:   Alert, oriented, thought content appropriate. Speech dysarthric without evidence of aphasia. Able to follow 3 step commands without difficulty.   Cranial Nerves:   II: Discs flat bilaterally; Visual fields grossly normal, pupils equal, round, reactive to light and accommodation   III,IV, VI: ptosis present in right eye, extra-ocular motions intact but not conjugate   V,VII: mild right facial droop, facial light touch sensation decreased on the right   VIII: hearing normal bilaterally   IX,X: gag reflex present   XI: bilateral shoulder shrug   XII: midline tongue extension without atrophy or fasciculations   Motor:   Right : Upper extremity 4-/5 R; Left: Upper extremity 5/5   Lower extremity R4+/5; Lower extremity 5/5   --drift in right arm and leg   Tone and bulk:normal tone throughout; no atrophy noted   Sensory: Decreased in right leg and arm   Deep Tendon Reflexes:   Right: Upper Extremity Left: Upper extremity   biceps (  C-5 to C-6) 2/4 biceps (C-5 to C-6) 2/4   tricep (C7) 2/4 triceps (C7) 2/4   Brachioradialis (C6) 2/4 Brachioradialis (C6) 2/4   Lower Extremity Lower Extremity   quadriceps (L-2 to L-4) 2/4 quadriceps (L-2 to L-4) 2/4   Achilles (S1) 1/4 Achilles (S1) 1/4   Plantars:   Right: downgoing Left: downgoing   Cerebellar:   normal finger-to-nose, normal heel-to-shin test   Gait: not tested   CV: pulses palpable throughout       ASSESSMENT Tina Gomez is a 58 y.o. female presenting with dysarthria and RUE weakness.. Status post IV t-PA 10/22/2013 at 4:18 pm. CT showed  minimal microvascular ischemic disease without acute intracranial process.  MRI pending.  She relates to me that she was not taking any type of antiplatelet agents for admission. Now on no antiplatlet therapy due to TPA administration for now but need later for secondary stroke prevention. Patient with resultant dysarthria and RUE weakness.  Work up underway.    Chol 204 LDL126 Htn Leukocytosis UDS [facility]     Hospital day # 1   TREATMENT/PLAN Continue aspirin 325 mg orally every day for secondary stroke prevention after 24 hrs. Therapy evals. pending Await MRI / MRA Await Echo and doppler Treat htn. Immediate goal is to keep the blood pressure less than 180/95 status post TPA.   Use IV meds prn  Repeat head CT scan status post TPA to evaluate for hemorrhage.   Mikey Bussing PA-C [facility] Pager [phone] 10/23/2013, 7:52 AM   I have personally obtained a history, examined the patient, evaluated imaging results, and formulated the assessment and plan of care. I agree with the above.

## 2013-10-23 NOTE — Progress Notes (Deleted)
Stroke Team Progress Note  HISTORY Tina Gomez is a 58 y.o. female who has not seen a medical MD for over one year. On 10/22/2013 she was sitting in her living room when she tried to write something down. She noted her writing was scribbled and then noted a fullness sensation in her right face, blurred vision and abnormal speech. Family members arrived at the house and noted her speech was abnormal and she had a facial droop. EMS was called and code stroke was called. En route her BP was 240/119, BG 210. On arrival BP remained elevated at 214/109, she continued to have dysarthria, right arm drift , decreased sensation in right arm and face.   Date last known well: Date: 10/22/2013  Time last known well: Time: 13:00  tPA Given: Yes  NIHSS 6    SUBJECTIVE   OBJECTIVE Most recent Vital Signs: Filed Vitals:   10/23/13 0400 10/23/13 0500 10/23/13 0600 10/23/13 0700  BP: 192/75 176/69 156/55 156/61  Pulse: 59 62 47 55  Temp:      TempSrc:      Resp: 14 15 14 12   Height:      Weight:      SpO2: 91% 95% 94% 94%   CBG (last 3)   Recent Labs  10/22/13 1611  GLUCAP 213*    IV Fluid Intake:   . sodium chloride 75 mL/hr at 10/23/13 0452  . niCARDipine Stopped (10/22/13 2000)    MEDICATIONS  . pantoprazole (PROTONIX) IV  40 mg Intravenous QHS  . pneumococcal 23 valent vaccine  0.5 mL Intramuscular Tomorrow-1000   PRN:  acetaminophen, acetaminophen, diphenhydrAMINE, labetalol, senna-docusate  Diet:  Cardiac thin liquids Activity:  Bedrest DVT Prophylaxis:  SCDs  CLINICALLY SIGNIFICANT STUDIES Basic Metabolic Panel:   Recent Labs Lab 10/22/13 1554 10/22/13 1614  NA 138 140  K 3.9 3.8  CL 98 102  CO2 22  --   GLUCOSE 216* 214*  BUN 17 20  CREATININE 0.95 1.00  CALCIUM 9.3  --    Liver Function Tests:   Recent Labs Lab 10/22/13 1554  AST 14  ALT 13  ALKPHOS 133*  BILITOT 0.3  PROT 7.8  ALBUMIN 3.6   CBC:   Recent Labs Lab 10/22/13 1554 10/22/13 1614   WBC 13.1*  --   NEUTROABS 9.8*  --   HGB 14.5 15.0  HCT 41.6 44.0  MCV 85.1  --   PLT 280  --    Coagulation:   Recent Labs Lab 10/22/13 1554  LABPROT 12.2  INR 0.92   Cardiac Enzymes:   Recent Labs Lab 10/22/13 1554  TROPONINI <0.30   Urinalysis:   Recent Labs Lab 10/22/13 1626  COLORURINE YELLOW  LABSPEC 1.013  PHURINE 7.0  GLUCOSEU NEGATIVE  HGBUR LARGE*  BILIRUBINUR NEGATIVE  KETONESUR NEGATIVE  PROTEINUR 100*  UROBILINOGEN 1.0  NITRITE NEGATIVE  LEUKOCYTESUR MODERATE*   Lipid Panel    Component Value Date/Time   CHOL 204* 10/23/2013 0250   TRIG 208* 10/23/2013 0250   HDL 36* 10/23/2013 0250   CHOLHDL 5.7 10/23/2013 0250   VLDL 42* 10/23/2013 0250   LDLCALC 126* 10/23/2013 0250   HgbA1C  No results found for this basename: HGBA1C    Urine Drug Screen:     Component Value Date/Time   LABOPIA NONE DETECTED 10/22/2013 1626   COCAINSCRNUR NONE DETECTED 10/22/2013 1626   LABBENZ NONE DETECTED 10/22/2013 1626   AMPHETMU NONE DETECTED 10/22/2013 1626   THCU POSITIVE* 10/22/2013 1626  LABBARB NONE DETECTED 10/22/2013 1626    Alcohol Level:   Recent Labs Lab 10/22/13 1554  ETH <11    Ct Head Wo Contrast 10/22/2013    Minimal microvascular ischemic disease without acute intracranial process.       Dg Chest Port 1 View 10/22/2013    COPD without acute disease.      MRI of the brain  pending  MRA of the brain  pending  2D Echocardiogram  pending  Carotid Doppler  pending   EKG  SR rate 65 BPM. For complete results please see formal report.   Therapy Recommendations pending  Physical Exam    Mental Status:  Alert, oriented, thought content appropriate. Speech dysarthric without evidence of aphasia. Able to follow 3 step commands without difficulty.  Cranial Nerves:  II: Discs flat bilaterally; Visual fields grossly normal, pupils equal, round, reactive to light and accommodation  III,IV, VI: ptosis present in right eye, extra-ocular  motions intact but not conjugate  V,VII: mild right facial droop, facial light touch sensation decreased on the right  VIII: hearing normal bilaterally  IX,X: gag reflex present  XI: bilateral shoulder shrug  XII: midline tongue extension without atrophy or fasciculations  Motor:  Right : Upper extremity 4/5 Left: Upper extremity 5/5  Lower extremity 4/5 Lower extremity 5/5  --drift in right arm and leg  Tone and bulk:normal tone throughout; no atrophy noted  Sensory: Decreased in right leg and arm  Deep Tendon Reflexes:  Right: Upper Extremity Left: Upper extremity  biceps (C-5 to C-6) 2/4 biceps (C-5 to C-6) 2/4  tricep (C7) 2/4 triceps (C7) 2/4  Brachioradialis (C6) 2/4 Brachioradialis (C6) 2/4  Lower Extremity Lower Extremity  quadriceps (L-2 to L-4) 2/4 quadriceps (L-2 to L-4) 2/4  Achilles (S1) 1/4 Achilles (S1) 1/4  Plantars:  Right: downgoing Left: downgoing  Cerebellar:  normal finger-to-nose, normal heel-to-shin test  Gait: not tested  CV: pulses palpable throughout    ASSESSMENT [name] is a 58 y.o. female presenting with dysarthria and RUE weakness.. Status post IV t-PA 10/22/2013 at 4:18 pm. CT showed minimal microvascular ischemic disease without acute intracranial process.  MRI pending.  On aspirin 81 mg orally every day prior to admission. Now on no antiplatlet therapy due to TPA administration. for secondary stroke prevention. Patient with resultant dysarthria and RUE weakness.  Work up underway.   Chol 204 LDL126  Htn  Leukocytosis  UDS [facility]   Hospital day # 1  TREATMENT/PLAN  Continue aspirin 325 mg orally every day for secondary stroke prevention after 24 hrs.  Therapy evals. pending  Await MRI / MRA  Await Echo and doppler  Treat htn  Mikey Bussing PA-C [facility] Pager [phone] 10/23/2013, 7:52 AM  I have personally obtained a history, examined the patient, evaluated imaging results, and formulated the  assessment and plan of care. I agree with the above.

## 2013-10-23 NOTE — Progress Notes (Signed)
  Echocardiogram 2D Echocardiogram has been performed.  Tina Gomez 10/23/2013, 11:16 AM

## 2013-10-23 NOTE — Progress Notes (Signed)
PT Cancellation Note  Patient Details Name: Tina Gomez MRN: [id number] DOB: 16-Jun-1956   Cancelled Treatment:    Reason Eval/Treat Not Completed: Patient at procedure or test/unavailable. Pt off floor at MRI at this time. Will re-attempt evaluation at next available time.    Elie Confer [place], [place] 10/23/2013, 4:35 PM

## 2013-10-24 LAB — GLUCOSE, CAPILLARY
GLUCOSE-CAPILLARY: 212 mg/dL — AB (ref 70–99)
GLUCOSE-CAPILLARY: 162 mg/dL — AB (ref 70–99)
Glucose-Capillary: 147 mg/dL — ABNORMAL HIGH (ref 70–99)
Glucose-Capillary: 183 mg/dL — ABNORMAL HIGH (ref 70–99)

## 2013-10-24 MED ORDER — PANTOPRAZOLE SODIUM 40 MG PO TBEC
40.0000 mg | DELAYED_RELEASE_TABLET | Freq: Every day | ORAL | Status: DC
  Administered 2013-10-25: 40 mg via ORAL
  Filled 2013-10-24: qty 1

## 2013-10-24 MED ORDER — LISINOPRIL 10 MG PO TABS
10.0000 mg | ORAL_TABLET | Freq: Every day | ORAL | Status: DC
  Administered 2013-10-24 – 2013-10-25 (×2): 10 mg via ORAL
  Filled 2013-10-24 (×2): qty 1

## 2013-10-24 MED ORDER — ATORVASTATIN CALCIUM 20 MG PO TABS
20.0000 mg | ORAL_TABLET | Freq: Every day | ORAL | Status: DC
  Filled 2013-10-24 (×2): qty 1

## 2013-10-24 MED ORDER — METFORMIN HCL 500 MG PO TABS
500.0000 mg | ORAL_TABLET | Freq: Two times a day (BID) | ORAL | Status: DC
  Administered 2013-10-24 – 2013-10-25 (×2): 500 mg via ORAL
  Filled 2013-10-24 (×4): qty 1

## 2013-10-24 NOTE — Evaluation (Signed)
Physical Therapy Evaluation Patient Details Name: Tina Gomez MRN: [id number] DOB: Mar 26, 1956 Today's Date: 10/24/2013 Time: [id number] PT Time Calculation (min): 14 min  PT Assessment / Plan / Recommendation History of Present Illness  Tina Gomez is a 58 y.o. female who has not seen a medical MD for over one year. On 10/22/2013 she was sitting in her living room when she tried to write something down. She noted her writing was scribbled and then noted a fullness sensation in her right face, blurred vision and abnormal speech. Family members arrived at the house and noted her speech was abnormal and she had a facial droop. EMS was called and code stroke was called. MRI revealed posterior   Clinical Impression  Pt adm due to the above. Pt ambulating at baseline level at this time. No focal weakness in LEs noted. Pt to benefit from OT evaluation to assess Rt hand fine motor deficits. Pt educated on signs and symptoms of stroke and given handout to increase awareness. Pt safe from mobility standpoint to D/C at this time. Encouraged pt to ambulate as tolerated 2-3x's daily. Will sign off.     PT Assessment  Patent does not need any further PT services    Follow Up Recommendations  No PT follow up;Supervision - Intermittent    Does the patient have the potential to tolerate intense rehabilitation      Barriers to Discharge        Equipment Recommendations  None recommended by PT    Recommendations for Other Services OT consult   Frequency      Precautions / Restrictions Precautions Precautions: None Restrictions Weight Bearing Restrictions: No   Pertinent Vitals/Pain C/o Rt hip pain; pt had hip pain PTA. Did not rate.       Mobility  Bed Mobility General bed mobility comments: pt sitting in chair; reported no problems with getting in/out of bed Transfers Overall transfer level: Modified independent Equipment used: None General transfer comment: uses handrails of chair; no  sway noted Ambulation/Gait Ambulation/Gait assistance: Supervision Ambulation Distance (Feet): 400 Feet Assistive device: None Gait Pattern/deviations: WFL(Within Functional Limits) Gait velocity: guarded; able to increase with cues Gait velocity interpretation: at or above normal speed for age/gender General Gait Details: challenged with high level balance activites; no LOB noted; pt at supervision level only for safey, progressing to mod I Stairs: Yes Stairs assistance: Supervision Stair Management: One rail Right;Step to pattern;Forwards Number of Stairs: 3 General stair comments: pt with Rt hip pain, which she has had prior to admission; requires incr time on steps but no LOB noted; min cues for sequencing  Modified Rankin (Stroke Patients Only) Pre-Morbid Rankin Score: No symptoms Modified Rankin: No significant disability    Exercises Other Exercises Other Exercises: pt given handout on stroke education; reviewed signs and symptoms thoroughly with pt and daughter; verbalized understanding    PT Diagnosis:    PT Problem List:   PT Treatment Interventions:       PT Goals(Current goals can be found in the care plan section) Acute Rehab PT Goals Patient Stated Goal: to get my hand working right PT Goal Formulation: No goals set, d/c therapy  Visit Information  Last PT Received On: 10/24/13 Assistance Needed: +1 History of Present Illness: Tina Gomez is a 58 y.o. female who has not seen a medical MD for over one year. On 10/22/2013 she was sitting in her living room when she tried to write something down. She noted her writing was  scribbled and then noted a fullness sensation in her right face, blurred vision and abnormal speech. Family members arrived at the house and noted her speech was abnormal and she had a facial droop. EMS was called and code stroke was called. MRI revealed posterior        Prior [place] expects to be discharged to::  Private residence Living Arrangements: Spouse/significant other;Children Available Help at Discharge: Family;Available 24 hours/day Type of Home: House Home Access: Stairs to enter [employer] of Steps: 3 Entrance Stairs-Rails: Can reach both;Left;Right Home Layout: One level Home Equipment: None Prior Function Level of Independence: Independent Comments: pt keeps grandchildren; still drives Communication Communication: No difficulties Dominant Hand: Right    Cognition  Cognition Arousal/Alertness: Awake/alert Behavior During Therapy: WFL for tasks assessed/performed Overall Cognitive Status: Within Functional Limits for tasks assessed    Extremity/Trunk Assessment Upper Extremity Assessment Upper Extremity Assessment: Defer to OT evaluation Lower Extremity Assessment Lower Extremity Assessment: Overall WFL for tasks assessed Cervical / Trunk Assessment Cervical / Trunk Assessment: Normal   Balance Balance Overall balance assessment: Modified Independent High level balance activites: Direction changes;Backward walking;Sudden stops;Head turns High Level Balance Comments: able to pick items up off ground; no LOB or sway noted  End of Session PT - End of Session Equipment Utilized During Treatment: Gait belt Activity Tolerance: Patient tolerated treatment well Patient left: in chair;with call bell/phone within reach;with family/visitor present Nurse Communication: Mobility status;Precautions  GP     Gustavus Bryant , [place]  10/24/2013, 10:54 AM

## 2013-10-24 NOTE — Progress Notes (Signed)
Stroke Team Progress Note  HISTORY HISTORY  Tina Gomez is a 58 y.o. female who has not seen a medical MD for over one year. On 10/22/2013 she was sitting in her living room when she tried to write something down. She noted her writing was scribbled and then noted a fullness sensation in her right face, blurred vision and abnormal speech. Family members arrived at the house and noted her speech was abnormal and she had a facial droop. EMS was called and code stroke was called. En route her BP was 240/119, BG 210. On arrival BP remained elevated at 214/109, she continued to have dysarthria, right arm drift , decreased sensation in right arm and face.   Date last known well: Date: 10/22/2013  Time last known well: Time: 13:00  tPA Given: Yes  NIHSS 6  SUBJECTIVE  She reports feeling better today. The weakness of the right upper extremity seems better. She reports that she can write better today compared to after she had her stroke. Hand writing reviewed and confirms the patient's improved handwriting.The particular hemorrhage which she complained about on yesterday involving the left hand is also better.  OBJECTIVE Most recent Vital Signs: Filed Vitals:   10/24/13 0400 10/24/13 0600 10/24/13 0700 10/24/13 0800  BP: 151/47 168/66 167/60 170/60  Pulse:      Temp: 97.5 F (36.4 C)     TempSrc: Oral     Resp: 15 16 17 17   Height:      Weight:      SpO2:       CBG (last 3)   Recent Labs  10/23/13 1712 10/23/13 2141 10/24/13 0854  GLUCAP 219* 128* 212*    IV Fluid Intake:   . sodium chloride Stopped (10/23/13 2000)  . niCARDipine 3 mg/hr (10/24/13 0800)    MEDICATIONS  . aspirin  325 mg Oral Daily  . insulin aspart  0-15 Units Subcutaneous TID WC  . insulin aspart  4 Units Subcutaneous TID WC  . pantoprazole (PROTONIX) IV  40 mg Intravenous QHS   PRN:  acetaminophen, acetaminophen, diphenhydrAMINE, hydrALAZINE, labetalol, senna-docusate  Diet:  Carb Control thin  liquids Activity:  Up with assistance DVT Prophylaxis:  SCDs  CLINICALLY SIGNIFICANT STUDIES Basic Metabolic Panel:  Recent Labs Lab 10/22/13 1554 10/22/13 1614  NA 138 140  K 3.9 3.8  CL 98 102  CO2 22  --   GLUCOSE 216* 214*  BUN 17 20  CREATININE 0.95 1.00  CALCIUM 9.3  --    Liver Function Tests:  Recent Labs Lab 10/22/13 1554  AST 14  ALT 13  ALKPHOS 133*  BILITOT 0.3  PROT 7.8  ALBUMIN 3.6   CBC:  Recent Labs Lab 10/22/13 1554 10/22/13 1614  WBC 13.1*  --   NEUTROABS 9.8*  --   HGB 14.5 15.0  HCT 41.6 44.0  MCV 85.1  --   PLT 280  --    Coagulation:  Recent Labs Lab 10/22/13 1554  LABPROT 12.2  INR 0.92   Cardiac Enzymes:  Recent Labs Lab 10/22/13 1554  TROPONINI <0.30   Urinalysis:  Recent Labs Lab 10/22/13 1626  COLORURINE YELLOW  LABSPEC 1.013  PHURINE 7.0  GLUCOSEU NEGATIVE  HGBUR LARGE*  BILIRUBINUR NEGATIVE  KETONESUR NEGATIVE  PROTEINUR 100*  UROBILINOGEN 1.0  NITRITE NEGATIVE  LEUKOCYTESUR MODERATE*   Lipid Panel    Component Value Date/Time   CHOL 204* 10/23/2013 0250   TRIG 208* 10/23/2013 0250   HDL 36* 10/23/2013 0250  CHOLHDL 5.7 10/23/2013 0250   VLDL 42* 10/23/2013 0250   LDLCALC 126* 10/23/2013 0250   HgbA1C  Lab Results  Component Value Date   HGBA1C 9.4* 10/23/2013    Urine Drug Screen:     Component Value Date/Time   LABOPIA NONE DETECTED 10/22/2013 1626   COCAINSCRNUR NONE DETECTED 10/22/2013 1626   LABBENZ NONE DETECTED 10/22/2013 1626   AMPHETMU NONE DETECTED 10/22/2013 1626   THCU POSITIVE* 10/22/2013 1626   LABBARB NONE DETECTED 10/22/2013 1626    Alcohol Level:  Recent Labs Lab 10/22/13 1554  ETH <11    Ct Head Wo Contrast 10/22/2013    Minimal microvascular ischemic disease without acute intracranial process.     Mr Brain Wo Contrast 10/23/2013     Acute 1 cm sized area of acute infarction affecting the posterior limb internal capsule on the left without hemorrhage. This is not visible on  prior CT.  Mild chronic microvascular ischemic change.  Motion degraded MRA examination demonstrating minor bilateral cavernous atheromatous change in the internal carotid arteries but no flow reducing lesion.      Dg Chest Port 1 View 10/22/2013    COPD without acute disease.       2D Echocardiogram  ejection fraction 60-65%. No cardiac source of emboli identified  Carotid Doppler   Preliminary findings: Bilateral: 1-39% ICA stenosis. Vertebral artery flow is antegrade.   EKG sinus rhythm rate 65 beats per minute  For complete results please see formal report.   Therapy Recommendations pending  Physical Exam    Mental Status:  Alert, oriented, thought content appropriate. Speech dysarthric without evidence of aphasia. Able to follow 3 step commands without difficulty.  Cranial Nerves:  II: Discs flat bilaterally; Visual fields grossly normal, pupils equal, round, reactive to light and accommodation  III,IV, VI: ptosis present in right eye, extra-ocular motions intact but not conjugate  V,VII: mild right facial droop, facial light touch sensation decreased on the right  VIII: hearing normal bilaterally  IX,X: gag reflex present  XI: bilateral shoulder shrug  XII: midline tongue extension without atrophy or fasciculations  Motor:  Right : Upper extremity 4- to 5/5 R, She does have a mild right pronator drift, fine finger movements are mildly impaired; Left: Upper extremity 5/5  Lower extremity R4+/5; Lower extremity 5/5  --drift in right arm and leg  Tone and bulk:normal tone throughout; no atrophy noted  Sensory: Decreased in right leg and arm  Deep Tendon Reflexes:  Right: Upper Extremity Left: Upper extremity  biceps (C-5 to C-6) 2/4 biceps (C-5 to C-6) 2/4  tricep (C7) 2/4 triceps (C7) 2/4  Brachioradialis (C6) 2/4 Brachioradialis (C6) 2/4  Lower Extremity Lower Extremity  quadriceps (L-2 to L-4) 2/4 quadriceps (L-2 to L-4) 2/4  Achilles (S1) 1/4 Achilles (S1) 1/4   Plantars:  Right: downgoing Left: downgoing  Cerebellar:  normal finger-to-nose, normal heel-to-shin test  Gait: not tested  CV: pulses palpable throughout    ASSESSMENT [name] is a 58 y.o. female presenting with dysarthria and right upper extremity weakness. Status post IV t-PA 10/22/2013 at 4:18 PM. CT scan showed minimal microvascular ischemic disease without acute intracranial process. MRI - acute 1 cm sized area of acute infarction affecting the posterior limb internal capsule on the left without hemorrhage.  Infarct felt to be thrombotic. On no antithrombotics prior to admission. Now on aspirin 325 mg orally every day for secondary stroke prevention. Patient with resultant dysarthria and right upper extremity weakness. Work up  underway.   Hyperlipidemia - cholesterol 204 LDL 126  Hypertension  New-onset diabetes hemoglobin A1c 9.4  Urine drug screen - [facility] day # 2  TREATMENT/PLAN  Continue aspirin 325 mg orally every day for secondary stroke prevention.  Await therapy evaluations  Start statin for hyperlipidemia - Lipitor 20 mg daily added  Treat diabetes - start metformin  Treat htn. Immediate goal is to keep the blood pressure less than 180/95 status post TPA. Use IV meds prn - head lisinopril  Repeat head CT scan status post TPA to evaluate for hemorrhage.  Transfer out of the unit tomorrow.   Mikey Bussing PA-C [facility] Pager [phone] 10/24/2013, 9:17 AM   I have personally obtained a history, examined the patient, evaluated imaging results, and formulated the assessment and plan of care. I agree with the above.

## 2013-10-25 LAB — GLUCOSE, CAPILLARY
GLUCOSE-CAPILLARY: 155 mg/dL — AB (ref 70–99)
Glucose-Capillary: 182 mg/dL — ABNORMAL HIGH (ref 70–99)
Glucose-Capillary: 149 mg/dL — ABNORMAL HIGH (ref 70–99)

## 2013-10-25 MED ORDER — METFORMIN HCL 500 MG PO TABS: 500.0000 mg | ORAL_TABLET | Freq: Two times a day (BID) | ORAL | Status: DC

## 2013-10-25 MED ORDER — ASPIRIN 325 MG PO TABS: 325.0000 mg | ORAL_TABLET | Freq: Every day | ORAL | Status: DC

## 2013-10-25 MED ORDER — ATORVASTATIN CALCIUM 20 MG PO TABS: 20.0000 mg | ORAL_TABLET | Freq: Every day | ORAL | Status: DC

## 2013-10-25 MED ORDER — LISINOPRIL 10 MG PO TABS: 10.0000 mg | ORAL_TABLET | Freq: Every day | ORAL | Status: DC

## 2013-10-25 MED ORDER — ATORVASTATIN CALCIUM 20 MG PO TABS
20.0000 mg | ORAL_TABLET | Freq: Every day | ORAL | Status: DC
Start: 2013-10-25 — End: 2013-10-25

## 2013-10-25 NOTE — Progress Notes (Signed)
Stroke Team Progress Note  HISTORY Tina Gomez is a 58 y.o. female who has not seen a medical MD for over one year. On 10/22/2013 she was sitting in her living room when she tried to write something down. She noted her writing was scribbled and then noted a fullness sensation in her right face, blurred vision and abnormal speech. Family members arrived at the house and noted her speech was abnormal and she had a facial droop. EMS was called and code stroke was called. En route her BP was 240/119, BG 210. On arrival BP remained elevated at 214/109, she continued to have dysarthria, right arm drift , decreased sensation in right arm and face.   Date last known well: Date: 10/22/2013  Time last known well: Time: 13:00  tPA Given: Yes  NIHSS 6  SUBJECTIVE Her daughter is at the bedside.  OBJECTIVE Most recent Vital Signs: Filed Vitals:   10/24/13 2300 10/25/13 0148 10/25/13 0945 10/25/13 1057  BP: 202/71 162/64 192/76 179/77  Pulse: 62 63 62   Temp: 97.7 F (36.5 C) 98.9 F (37.2 C) 97.8 F (36.6 C)   TempSrc: Oral Oral Oral   Resp: 16 18 18    Height: 5\' 3"  (1.6 m)     Weight: 82.4 kg (181 lb 10.5 oz)     SpO2: 98% 93% 95%    CBG (last 3)   Recent Labs  10/24/13 2156 10/25/13 0655 10/25/13 1146  GLUCAP 183* 182* 155*    IV Fluid Intake:      MEDICATIONS  . aspirin  325 mg Oral Daily  . atorvastatin  20 mg Oral q1800  . insulin aspart  0-15 Units Subcutaneous TID WC  . insulin aspart  4 Units Subcutaneous TID WC  . lisinopril  10 mg Oral Daily  . metFORMIN  500 mg Oral BID WC  . pantoprazole  40 mg Oral QHS   PRN:  acetaminophen, acetaminophen, diphenhydrAMINE, hydrALAZINE, labetalol, senna-docusate  Diet:  Carb Control thin liquids Activity:  Up with assistance DVT Prophylaxis:  SCDs  CLINICALLY SIGNIFICANT STUDIES Basic Metabolic Panel:   Recent Labs Lab 10/22/13 1554 10/22/13 1614  NA 138 140  K 3.9 3.8  CL 98 102  CO2 22  --   GLUCOSE 216* 214*  BUN  17 20  CREATININE 0.95 1.00  CALCIUM 9.3  --    Liver Function Tests:   Recent Labs Lab 10/22/13 1554  AST 14  ALT 13  ALKPHOS 133*  BILITOT 0.3  PROT 7.8  ALBUMIN 3.6   CBC:   Recent Labs Lab 10/22/13 1554 10/22/13 1614  WBC 13.1*  --   NEUTROABS 9.8*  --   HGB 14.5 15.0  HCT 41.6 44.0  MCV 85.1  --   PLT 280  --    Coagulation:   Recent Labs Lab 10/22/13 1554  LABPROT 12.2  INR 0.92   Cardiac Enzymes:   Recent Labs Lab 10/22/13 1554  TROPONINI <0.30   Urinalysis:   Recent Labs Lab 10/22/13 1626  COLORURINE YELLOW  LABSPEC 1.013  PHURINE 7.0  GLUCOSEU NEGATIVE  HGBUR LARGE*  BILIRUBINUR NEGATIVE  KETONESUR NEGATIVE  PROTEINUR 100*  UROBILINOGEN 1.0  NITRITE NEGATIVE  LEUKOCYTESUR MODERATE*   Lipid Panel    Component Value Date/Time   CHOL 204* 10/23/2013 0250   TRIG 208* 10/23/2013 0250   HDL 36* 10/23/2013 0250   CHOLHDL 5.7 10/23/2013 0250   VLDL 42* 10/23/2013 0250   LDLCALC 126* 10/23/2013 0250  HgbA1C  Lab Results  Component Value Date   HGBA1C 9.4* 10/23/2013    Urine Drug Screen:     Component Value Date/Time   LABOPIA NONE DETECTED 10/22/2013 1626   COCAINSCRNUR NONE DETECTED 10/22/2013 1626   LABBENZ NONE DETECTED 10/22/2013 1626   AMPHETMU NONE DETECTED 10/22/2013 1626   THCU POSITIVE* 10/22/2013 1626   LABBARB NONE DETECTED 10/22/2013 1626    Alcohol Level:   Recent Labs Lab 10/22/13 1554  ETH <11    Ct Head Wo Contrast 10/22/2013    Minimal microvascular ischemic disease without acute intracranial process.    MRI/MRA Brain Wo Contrast 10/23/2013     Acute 1 cm sized area of acute infarction affecting the posterior limb internal capsule on the left without hemorrhage. This is not visible on prior CT.  Mild chronic microvascular ischemic change.  Motion degraded MRA examination demonstrating minor bilateral cavernous atheromatous change in the internal carotid arteries but no flow reducing lesion.     Dg Chest Port 1  View 10/22/2013    COPD without acute disease.      2D Echocardiogram  ejection fraction 60-65%. No cardiac source of emboli identified  Carotid Doppler   Preliminary findings: Bilateral: 1-39% ICA stenosis. Vertebral artery flow is antegrade.   EKG sinus rhythm rate 65 beats per minute  For complete results please see formal report.   Therapy Recommendations no PT or OT  Physical Exam   Mental Status:  Alert, oriented, thought content appropriate. Speech dysarthric without evidence of aphasia. Able to follow 3 step commands without difficulty.  Cranial Nerves:  II: Discs flat bilaterally; Visual fields grossly normal, pupils equal, round, reactive to light and accommodation  III,IV, VI: ptosis present in right eye, extra-ocular motions intact but not conjugate  V,VII: mild right facial droop, facial light touch sensation decreased on the right  VIII: hearing normal bilaterally  IX,X: gag reflex present  XI: bilateral shoulder shrug  XII: midline tongue extension without atrophy or fasciculations  Motor:  Right : Upper extremity 4- to 5/5 R, She does have a mild right pronator drift, fine finger movements are mildly impaired; Left: Upper extremity 5/5  Lower extremity R4+/5; Lower extremity 5/5  --drift in right arm and leg  Tone and bulk:normal tone throughout; no atrophy noted  Sensory: Decreased in right leg and arm  Deep Tendon Reflexes:  Right: Upper Extremity Left: Upper extremity  biceps (C-5 to C-6) 2/4 biceps (C-5 to C-6) 2/4  tricep (C7) 2/4 triceps (C7) 2/4  Brachioradialis (C6) 2/4 Brachioradialis (C6) 2/4  Lower Extremity Lower Extremity  quadriceps (L-2 to L-4) 2/4 quadriceps (L-2 to L-4) 2/4  Achilles (S1) 1/4 Achilles (S1) 1/4  Plantars:  Right: downgoing Left: downgoing  Cerebellar:  normal finger-to-nose, normal heel-to-shin test  Gait: not tested  CV: pulses palpable throughout    ASSESSMENT Tina Gomez is a 58 y.o. female presenting with  dysarthria and right upper extremity weakness. Status post IV t-PA 10/22/2013 at 4:18 PM. CT scan showed minimal microvascular ischemic disease without acute intracranial process. MRI - acute 1 cm sized area of acute infarction affecting the posterior limb internal capsule on the left without hemorrhage.  Infarct felt to be thrombotic secondary to small vessel disease. On no antithrombotics prior to admission. Now on aspirin 325 mg orally every day for secondary stroke prevention. Patient with resultant dysarthria and right upper extremity weakness. Work up completed.   Hyperlipidemia - cholesterol 204 LDL 126, started on lipitor 20  mg daily  Hypertension  New-onset diabetes hemoglobin A1c 9.4, metformin added  Urine drug screen - [facility] positive   Hospital day # 3  TREATMENT/PLAN  Continue aspirin 325 mg orally every day for secondary stroke prevention.  No driving for a week or two, Will reassess at time of follow-up No further stroke workup indicated. Patient has a 10-15% risk of having another stroke over the next year, the highest risk is within 2 weeks of the most recent stroke/TIA (risk of having a stroke following a stroke or TIA is the same). Ongoing risk factor control by Primary Care Physician Discharge home Follow up with Dr. Leonie Man, [facility], in 2 months.  Burnetta Sabin, MSN, RN, ANVP-BC, ANP-BC, [facility] Pager: [phone] 10/25/2013 2:59 PM  I have personally obtained a history, examined the patient, evaluated imaging results, and formulated the assessment and plan of care. I agree with the above. Antony Contras, MD

## 2013-10-25 NOTE — Evaluation (Signed)
Occupational Therapy Evaluation Patient Details Name: Tina Gomez MRN: [id number] DOB: [birth year] Today's Date: 10/25/2013 Time: [id number] OT Time Calculation (min): 25 min  OT Assessment / Plan / Recommendation History of present illness Tina Gomez is a 58 y.o. female who has not seen a medical MD for over one year. On 10/22/2013 she was sitting in her living room when she tried to write something down. She noted her writing was scribbled and then noted a fullness sensation in her right face, blurred vision and abnormal speech. Family members arrived at the house and noted her speech was abnormal and she had a facial droop. EMS was called and code stroke was called. MRI revealed posterior    Clinical Impression   Pt moving well during session. Pt with RUE deficits-OT educated on activities pt can be doing and had her perform some during session. Told pt to ask MD about driving.     OT Assessment  Patient does not need any further OT services    Follow Up Recommendations  No OT follow up;Supervision - Intermittent    Barriers to Discharge      Equipment Recommendations  3 in 1 bedside comode    Recommendations for Other Services    Frequency       Precautions / Restrictions Precautions Precautions: None Restrictions Weight Bearing Restrictions: No   Pertinent Vitals/Pain No pain reported.     ADL  Lower Body Dressing: Supervision/safety Where Assessed - Lower Body Dressing: Unsupported sit to stand Toilet Transfer: Modified independent Toilet Transfer Method: Sit to stand Toilet Transfer Equipment: Regular height toilet Tub/Shower Transfer: Associate Professor Method: Therapist, art: Other (comment);Walk in shower (practiced stepping over) Equipment Used: Gait belt;Other (comment) (theraputty) Transfers/Ambulation Related to ADLs: Supervision for ambulatiion and Min guard for tub transfer and supervision for  shower transfer. ADL Comments: Pt feels like balance is slightly off compared to normal. Recommended sitting on chair for bathing and having someone with her for transfer. Recommended sitting for dressing. Encouraged pt to be using right hand during activities. Gave pt handout and theraputty and reviewed exercises/activities for her to be doing with right hand. Also, spoke briefly about gross motor coordination activities. Told pt to ask MD about driving. Educated on signs/symptoms of stroke and importance of getting help right away. Advised pt to quit smoking and to avoid canned foods as it increases chance for stroke. Recommended avoiding hot surfaces and sharp objects due to decreased sensation in RUE.   OT Diagnosis:    OT Problem List:   OT Treatment Interventions:     OT Goals(Current goals can be found in the care plan section)    Visit Information  Last OT Received On: 10/25/13 Assistance Needed: +1 History of Present Illness: Tina Gomez is a 58 y.o. female who has not seen a medical MD for over one year. On 10/22/2013 she was sitting in her living room when she tried to write something down. She noted her writing was scribbled and then noted a fullness sensation in her right face, blurred vision and abnormal speech. Family members arrived at the house and noted her speech was abnormal and she had a facial droop. EMS was called and code stroke was called. MRI revealed posterior        Prior [place] expects to be discharged to:: Private residence Living Arrangements: Spouse/significant other;Children Available Help at Discharge: Family;Available 24 hours/day Type of Home: [place]  Access: Stairs to enter [employer] of Steps: 3 Entrance Stairs-Rails: Can reach both;Left;Right Home Layout: One level Home Equipment: None (squeeze ball) Prior Function Level of Independence: Independent Comments: pt keeps grandchildren; still  drives Communication Communication: No difficulties Dominant Hand: Right         Vision/Perception Vision - History Baseline Vision: Wears glasses only for reading Visual History:  (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) Patient Visual Report: Other (comment) (blurry in right eye; increased drooping of  right eyelid) Vision - Assessment Vision Assessment: Vision tested Tracking/Visual Pursuits: Other (comment) (difficulty tracking on left side at times/pt would lose it) Visual Fields: No apparent deficits   Cognition  Cognition Arousal/Alertness: Awake/alert Behavior During Therapy: WFL for tasks assessed/performed Overall Cognitive Status: Within Functional Limits for tasks assessed    Extremity/Trunk Assessment Upper Extremity Assessment Upper Extremity Assessment: RUE deficits/detail RUE Deficits / Details: RUE weaker when compared to left; grip strength and finger strength weaker RUE Sensation: decreased light touch RUE Coordination: decreased fine motor;decreased gross motor     Mobility Transfers Overall transfer level: Modified independent Equipment used: None     Exercise     Balance     End of Session OT - End of Session Equipment Utilized During Treatment: Gait belt Activity Tolerance: Patient tolerated treatment well Patient left: in bed;with bed alarm set;with call bell/phone within reach  [employer] OTR/L [id number] 10/25/2013, 10:18 AM

## 2013-10-25 NOTE — Progress Notes (Signed)
Talked to patient about follow up hospital care; patient does not have a pcp; patient is agreeable to go to the [facility]- talked to [facility] at the Clinic and they will call the patient at home with an apt date and time for follow up care/ also CM gave the clinic the daughter's name and number [name] [phone]); Patient goes to [place] to get her prescriptions filled; Pt stated that she does not have any problems getting her medications; Aneta Mins [phone]

## 2013-10-25 NOTE — Progress Notes (Signed)
Patient is discharged from room 4N18 at this time. Alert and in stable condition. IV site d/c'd as well as tele. instructions read to patient and understanding verbalized. Left unit via wheelchair.

## 2013-10-25 NOTE — Evaluation (Signed)
Speech Language Pathology Evaluation Patient Details Name: Tina Gomez MRN: [id number] DOB: 1956-03-09 Today's Date: 10/25/2013 Time: [id number] SLP Time Calculation (min): 15 min  Problem List:  Patient Active Problem List   Diagnosis Date Noted  . CVA (cerebral infarction) 10/22/2013  . 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 Hypertension    Past Surgical History:  Past Surgical History  Procedure Laterality Date  . Cesarean section     HPI:  58 y.o. female who has not seen a medical MD for over one year. Today she was sitting in her living room when she tries to write something down. She noted her writing was scribbled and then noted a fullness sensation in her right face, blurred vision and abnormal speech. Family members arrived at the house and noted her speech was abnormal and she had a facial droop.   MRI Acute 1 cm sized area of acute infarction affecting the posterior limb internal capsule on the left without hemorrhage.   Assessment / Plan / Recommendation Clinical Impression  Pt. exhibits intermittent minimal dysarthria during increased rate of speech and decreased labial/lingual ROM.  Speech is intelligible, however noisy settings or using phone may decrease pt.'s intelligibility.  SLP educated pt. and daughter on strategies to implement to increase clarity in these environments.  Cognition and language (verbal/written) are WFL's.  No ST recommended.        SLP Assessment  Patient does not need any further Speech Lanaguage Pathology Services    Follow Up Recommendations  None    Frequency and Duration        Pertinent Vitals/Pain WDL   SLP Goals     SLP Evaluation Prior Functioning  Cognitive/Linguistic Baseline: Within functional limits Type of Home: House  Lives With: Alone ("cottage behind daughter) Available Help at Discharge: Family;Available 24 hours/day Vocation: Unemployed    Cognition  Overall Cognitive Status: Within Functional Limits for tasks assessed Orientation Level: Oriented X4    Comprehension  Auditory Comprehension Overall Auditory Comprehension: Appears within functional limits for tasks assessed Visual Recognition/Discrimination Discrimination: Not tested Reading Comprehension Reading Status: Within funtional limits    Expression Expression Primary Mode of Expression: Verbal Verbal Expression Overall Verbal Expression: Appears within functional limits for tasks assessed Written Expression Dominant Hand: Right Written Expression: Within Functional Limits   Oral / Motor Oral Motor/Sensory Function Overall Oral Motor/Sensory Function: Impaired Labial ROM: Reduced right Labial Symmetry: Abnormal symmetry right Labial Strength: Reduced Labial Sensation: Reduced Lingual ROM: Reduced right Lingual Symmetry: Abnormal symmetry right Lingual Strength: Reduced Facial ROM: Reduced right Facial Symmetry: Right droop Mandible: Within Functional Limits Motor Speech Overall Motor Speech: Impaired (minimal dysarthria) Respiration: Within functional limits Phonation: Normal Resonance: Within functional limits Articulation: Within functional limitis Intelligibility: Intelligible Motor Planning: Witnin functional limits   GO     Orbie Pyo [employer] [phone]  10/25/2013

## 2013-10-25 NOTE — Discharge Summary (Signed)
Stroke Discharge Summary  Patient ID: Tina Gomez   MRN: [id number]      DOB: [birth year]  Date of Admission: 10/22/2013 Date of Discharge: 10/25/2013  Attending Physician:  Suzzanne Cloud, MD, Stroke MD  Patient's PCP:  No primary provider on file.  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  Past Medical History  Diagnosis Date  . Hypertension    Past Surgical History  Procedure Laterality Date  . Cesarean section        Medication List    STOP taking these medications       aspirin EC 81 MG tablet  Replaced by:  aspirin 325 MG tablet      TAKE these medications       aspirin 325 MG tablet  Take 1 tablet (325 mg total) by mouth daily.     atorvastatin 20 MG tablet  Commonly known as:  LIPITOR  Take 1 tablet (20 mg total) by mouth daily at 6 PM.     lisinopril 10 MG tablet  Commonly known as:  PRINIVIL,ZESTRIL  Take 1 tablet (10 mg total) by mouth daily.     metFORMIN 500 MG tablet  Commonly known as:  GLUCOPHAGE  Take 1 tablet (500 mg total) by mouth 2 (two) times daily with a meal.        LABORATORY STUDIES CBC    Component Value Date/Time   WBC 13.1* 10/22/2013 1554   RBC 4.89 10/22/2013 1554   HGB 15.0 10/22/2013 1614   HCT 44.0 10/22/2013 1614   PLT 280 10/22/2013 1554   MCV 85.1 10/22/2013 1554   MCH 29.7 10/22/2013 1554   MCHC 34.9 10/22/2013 1554   RDW 13.3 10/22/2013 1554   LYMPHSABS 2.4 10/22/2013 1554   MONOABS 0.7 10/22/2013 1554   EOSABS 0.1 10/22/2013 1554   BASOSABS 0.0 10/22/2013 1554   CMP    Component Value Date/Time   NA 140 10/22/2013 1614   K 3.8 10/22/2013 1614   CL 102 10/22/2013 1614   CO2 22 10/22/2013 1554   GLUCOSE 214* 10/22/2013 1614   BUN 20 10/22/2013 1614   CREATININE 1.00 10/22/2013 1614   CALCIUM 9.3 10/22/2013 1554   PROT 7.8  10/22/2013 1554   ALBUMIN 3.6 10/22/2013 1554   AST 14 10/22/2013 1554   ALT 13 10/22/2013 1554   ALKPHOS 133* 10/22/2013 1554   BILITOT 0.3 10/22/2013 1554   GFRNONAA 66* 10/22/2013 1554   GFRAA 76* 10/22/2013 1554   COAGS Lab Results  Component Value Date   INR 0.92 10/22/2013   INR 0.9 04/29/2009   Lipid Panel    Component Value Date/Time   CHOL 204* 10/23/2013 0250   TRIG 208* 10/23/2013 0250   HDL 36* 10/23/2013 0250   CHOLHDL 5.7 10/23/2013 0250   VLDL 42* 10/23/2013 0250   LDLCALC 126* 10/23/2013 0250   HgbA1C  Lab Results  Component Value Date   HGBA1C 9.4* 10/23/2013   Cardiac Panel (last 3 results) No results found for this basename: CKTOTAL, CKMB, TROPONINI, RELINDX,  in the last 72 hours Urinalysis    Component Value Date/Time   COLORURINE YELLOW 10/22/2013 1626   APPEARANCEUR HAZY* 10/22/2013 1626   LABSPEC 1.013 10/22/2013 1626   PHURINE 7.0 10/22/2013 1626   GLUCOSEU  NEGATIVE 10/22/2013 1626   HGBUR LARGE* 10/22/2013 1626   BILIRUBINUR NEGATIVE 10/22/2013 1626   KETONESUR NEGATIVE 10/22/2013 1626   PROTEINUR 100* 10/22/2013 1626   UROBILINOGEN 1.0 10/22/2013 1626   NITRITE NEGATIVE 10/22/2013 1626   LEUKOCYTESUR MODERATE* 10/22/2013 1626   Urine Drug Screen     Component Value Date/Time   LABOPIA NONE DETECTED 10/22/2013 1626   COCAINSCRNUR NONE DETECTED 10/22/2013 1626   LABBENZ NONE DETECTED 10/22/2013 1626   AMPHETMU NONE DETECTED 10/22/2013 1626   THCU POSITIVE* 10/22/2013 1626   LABBARB NONE DETECTED 10/22/2013 1626    Alcohol Level    Component Value Date/Time   ETH <11 10/22/2013 1554     SIGNIFICANT DIAGNOSTIC STUDIES Ct Head Wo Contrast 10/22/2013 Minimal microvascular ischemic disease without acute intracranial process.  MRI/MRA Brain Wo Contrast 10/23/2013 Acute 1 cm sized area of acute infarction affecting the posterior limb internal capsule on the left without hemorrhage. This is not visible on prior CT. Mild chronic microvascular ischemic change. Motion  degraded MRA examination demonstrating minor bilateral cavernous atheromatous change in the internal carotid arteries but no flow reducing lesion.  Dg Chest Port 1 View 10/22/2013 COPD without acute disease.  2D Echocardiogram ejection fraction 60-65%. No cardiac source of emboli identified  Carotid Doppler Preliminary findings: Bilateral: 1-39% ICA stenosis. Vertebral artery flow is antegrade.  EKG sinus rhythm rate 65 beats per minute For complete results please see formal report.      History of Present Illness   Tina Gomez is a 58 y.o. female who has not seen a medical MD for over one year. On 10/22/2013 she was sitting in her living room when she tried to write something down. She noted her writing was scribbled and then noted a fullness sensation in her right face, blurred vision and abnormal speech. Family members arrived at the house and noted her speech was abnormal and she had a facial droop. EMS was called and code stroke was called. En route her BP was 240/119, BG 210. On arrival BP remained elevated at 214/109, she continued to have dysarthria, right arm drift , decreased sensation in right arm and face.  Date last known well: Date: 10/22/2013  Time last known well: Time: 13:00  tPA Given: Yes  NIHSS 6   Hospital Course Patient tolerated tPA without significant complication. Of note, she did have some tiny hand/arm petechial hemorrhages that resolved. Imaging at 24 hours shows no hemorrhage. MRI confirmed ischemic infarct in the acute infarction affecting the posterior limb internal capsule on the left without hemorrhage. . Stroke was found to be thrombotic secondary to small vessel disease. She was on no antithrombotics prior to admission. She was started on/changed to aspirin 325 mg orally every day for secondary stroke prevention. Patient with resultant dysarthria and right upper extremity weakness, which improved/resolved. Physical therapy, occupational therapy and speech therapy  evaluated patient. They recommend no follow up therapies.  Patient with vascular risk factors of:  Hyperlipidemia - cholesterol 204 LDL 126, started on lipitor 20 mg daily  Hypertension  New-onset diabetes hemoglobin A1c 9.4, metformin added  Urine drug screen - THC positive  Discharge Exam  Blood pressure 138/61, pulse 79, temperature 98.1 F (36.7 C), temperature source Oral, resp. rate 18, height 5\' 3"  (1.6 m), weight 82.4 kg (181 lb 10.5 oz), SpO2 97.00%.  Physical Exam  Mental Status:  Alert, oriented, thought content appropriate. Speech dysarthric without evidence of aphasia. Able to follow 3 step commands without difficulty.  Cranial  Nerves:  II: Discs flat bilaterally; Visual fields grossly normal, pupils equal, round, reactive to light and accommodation  III,IV, VI: ptosis present in right eye, extra-ocular motions intact but not conjugate  V,VII: mild right facial droop, facial light touch sensation decreased on the right  VIII: hearing normal bilaterally  IX,X: gag reflex present  XI: bilateral shoulder shrug  XII: midline tongue extension without atrophy or fasciculations  Motor:  Right : Upper extremity 4- to 5/5 R, She does have a mild right pronator drift, fine finger movements are mildly impaired; Left: Upper extremity 5/5  Lower extremity R4+/5; Lower extremity 5/5  --drift in right arm and leg  Tone and bulk:normal tone throughout; no atrophy noted  Sensory: Decreased in right leg and arm  Deep Tendon Reflexes:  Right: Upper Extremity Left: Upper extremity  biceps (C-5 to C-6) 2/4 biceps (C-5 to C-6) 2/4  tricep (C7) 2/4 triceps (C7) 2/4  Brachioradialis (C6) 2/4 Brachioradialis (C6) 2/4  Lower Extremity Lower Extremity  quadriceps (L-2 to L-4) 2/4 quadriceps (L-2 to L-4) 2/4  Achilles (S1) 1/4 Achilles (S1) 1/4  Plantars:  Right: downgoing Left: downgoing  Cerebellar:  normal finger-to-nose, normal heel-to-shin test  Gait: not tested  CV: pulses palpable  throughout    Discharge Diet   Carb Control thin liquids  Discharge Plan    Disposition:  Home with family   aspirin 325 mg orally every day for secondary stroke prevention.  No driving for a week or two, Will reassess at time of follow-up  Stop marijuana use  Ongoing risk factor control by Primary Care Physician.  Follow-up primary care MD in 1 month. Get one if you do not have.  Follow-up with Dr. Antony Contras, Stroke Clinic in 2 months.  30 minutes were spent preparing discharge.  Signed Burnetta Sabin, AVNP-BC, ANP-BC, [facility] Nurse Practitioner 10/25/2013, 3:58 PM  I have personally examined this patient, reviewed pertinent data and developed the plan of care. I agree with above. Antony Contras, MD

## 2013-10-30 ENCOUNTER — Ambulatory Visit: Payer: Self-pay | Attending: Internal Medicine | Admitting: Internal Medicine

## 2013-10-30 ENCOUNTER — Emergency Department (HOSPITAL_COMMUNITY): Payer: Self-pay

## 2013-10-30 ENCOUNTER — Emergency Department (HOSPITAL_COMMUNITY)
Admission: EM | Admit: 2013-10-30 | Discharge: 2013-10-30 | Disposition: A | Discharge status: Home / Self Care | Payer: Self-pay | Attending: Emergency Medicine | Admitting: Emergency Medicine

## 2013-10-30 ENCOUNTER — Encounter (HOSPITAL_COMMUNITY): Payer: Self-pay | Admitting: Emergency Medicine

## 2013-10-30 ENCOUNTER — Encounter: Payer: Self-pay | Admitting: Internal Medicine

## 2013-10-30 VITALS — BP 204/84 | HR 66 | Temp 98.1°F | Wt 213.8 lb

## 2013-10-30 DIAGNOSIS — F17200 Nicotine dependence, unspecified, uncomplicated: Secondary | ICD-10-CM | POA: Insufficient documentation

## 2013-10-30 DIAGNOSIS — Z79899 Other long term (current) drug therapy: Secondary | ICD-10-CM | POA: Insufficient documentation

## 2013-10-30 DIAGNOSIS — Z8673 Personal history of transient ischemic attack (TIA), and cerebral infarction without residual deficits: Secondary | ICD-10-CM | POA: Insufficient documentation

## 2013-10-30 DIAGNOSIS — R5383 Other fatigue: Secondary | ICD-10-CM

## 2013-10-30 DIAGNOSIS — Z7982 Long term (current) use of aspirin: Secondary | ICD-10-CM | POA: Insufficient documentation

## 2013-10-30 DIAGNOSIS — I10 Essential (primary) hypertension: Secondary | ICD-10-CM | POA: Insufficient documentation

## 2013-10-30 DIAGNOSIS — M25559 Pain in unspecified hip: Secondary | ICD-10-CM | POA: Insufficient documentation

## 2013-10-30 DIAGNOSIS — R5381 Other malaise: Secondary | ICD-10-CM | POA: Insufficient documentation

## 2013-10-30 DIAGNOSIS — M25551 Pain in right hip: Secondary | ICD-10-CM

## 2013-10-30 DIAGNOSIS — I6350 Cerebral infarction due to unspecified occlusion or stenosis of unspecified cerebral artery: Secondary | ICD-10-CM

## 2013-10-30 DIAGNOSIS — I639 Cerebral infarction, unspecified: Secondary | ICD-10-CM

## 2013-10-30 HISTORY — DX: Cerebral infarction, unspecified: I63.9

## 2013-10-30 LAB — BASIC METABOLIC PANEL
BUN: 28 mg/dL — ABNORMAL HIGH (ref 6–23)
CO2: 25 mEq/L (ref 19–32)
CREATININE: 0.77 mg/dL (ref 0.50–1.10)
Calcium: 9.3 mg/dL (ref 8.4–10.5)
Chloride: 102 mEq/L (ref 96–112)
Glucose, Bld: 190 mg/dL — ABNORMAL HIGH (ref 70–99)
Potassium: 4.1 mEq/L (ref 3.7–5.3)
Sodium: 140 mEq/L (ref 137–147)

## 2013-10-30 LAB — CBC WITH DIFFERENTIAL/PLATELET
BASOS ABS: 0 10*3/uL (ref 0.0–0.1)
Basophils Relative: 0 % (ref 0–1)
EOS ABS: 0.1 10*3/uL (ref 0.0–0.7)
Eosinophils Relative: 1 % (ref 0–5)
HCT: 38.4 % (ref 36.0–46.0)
HEMOGLOBIN: 13.1 g/dL (ref 12.0–15.0)
LYMPHS PCT: 20 % (ref 12–46)
Lymphs Abs: 1.8 10*3/uL (ref 0.7–4.0)
MCH: 29.3 pg (ref 26.0–34.0)
MCHC: 34.1 g/dL (ref 30.0–36.0)
MCV: 85.9 fL (ref 78.0–100.0)
MONOS PCT: 9 % (ref 3–12)
Monocytes Absolute: 0.8 10*3/uL (ref 0.1–1.0)
NEUTROS PCT: 70 % (ref 43–77)
Neutro Abs: 6.1 10*3/uL (ref 1.7–7.7)
Platelets: 250 10*3/uL (ref 150–400)
RBC: 4.47 MIL/uL (ref 3.87–5.11)
RDW: 13.3 % (ref 11.5–15.5)
WBC: 8.8 10*3/uL (ref 4.0–10.5)

## 2013-10-30 MED ORDER — ATORVASTATIN CALCIUM 20 MG PO TABS: 20.0000 mg | ORAL_TABLET | Freq: Every day | ORAL | Status: DC

## 2013-10-30 MED ORDER — LISINOPRIL 20 MG PO TABS
30.0000 mg | ORAL_TABLET | Freq: Once | ORAL | Status: AC
  Administered 2013-10-30: 30 mg via ORAL
  Filled 2013-10-30 (×2): qty 1

## 2013-10-30 MED ORDER — HYDROCHLOROTHIAZIDE 25 MG PO TABS: 25.0000 mg | ORAL_TABLET | Freq: Every day | ORAL | Status: DC

## 2013-10-30 MED ORDER — METFORMIN HCL 500 MG PO TABS: 500.0000 mg | ORAL_TABLET | Freq: Two times a day (BID) | ORAL | Status: DC

## 2013-10-30 MED ORDER — CYCLOBENZAPRINE HCL 5 MG PO TABS: 5.0000 mg | ORAL_TABLET | Freq: Three times a day (TID) | ORAL | Status: DC | PRN

## 2013-10-30 MED ORDER — LISINOPRIL 40 MG PO TABS: 40.0000 mg | ORAL_TABLET | Freq: Every day | ORAL | Status: DC

## 2013-10-30 MED ORDER — HYDROCHLOROTHIAZIDE 25 MG PO TABS
25.0000 mg | ORAL_TABLET | Freq: Every day | ORAL | Status: DC
  Administered 2013-10-30: 25 mg via ORAL
  Filled 2013-10-30: qty 1

## 2013-10-30 NOTE — Addendum Note (Signed)
Addended by: Allyson Sabal MD, Ascencion Dike on: 10/30/2013 11:05 AM   Modules accepted: Orders

## 2013-10-30 NOTE — ED Notes (Signed)
10/22/2013 pt suffered a CVA.  Pt went to [facility] this morning for a check up.  Pt sent here for further eval of hypertension.  210/90.

## 2013-10-30 NOTE — ED Provider Notes (Signed)
CSN: [id number]     Arrival date & time 10/30/13  1119 History   First MD Initiated Contact with Patient 10/30/13 1126     Chief Complaint  Patient presents with  . Hypertension     (Consider location/radiation/quality/duration/timing/severity/associated sxs/prior Treatment) HPI Comments: Tina Gomez is a 58 y.o. Female with a prior history of hypertension, new onset diabetes and is now 8 days out from an ischemic stroke who was treated with tpa and has had progression of improvement in her right upper extremity weakness and writing skills.  She was seen at her clinic this am and her blood pressure was found to be 210/90.  She also related worsening of not only the pain in her right hip (which started while hospitalized in the ICU) but also weakness in the extremity,  Describing having to drag the right foot with ambulation for the past 2 days.  She denies injury or falls and does not have a history of injury in the hip joint.  She denied having headache, chest pain, shortness of breath, nausea, vomiting, fevers or any other weakness.  Her pcp increased her lisinopril to 40 mg (from 10 mg which was taken this am) and also added hctz 25 mg PO daily today, writing her scripts for these medicines just prior to arrival.  She was given clonidine 0.3 mg po prior to transport here.     The history is provided by the patient and a relative.    Past Medical History  Diagnosis Date  . Hypertension   . Stroke    Past Surgical History  Procedure Laterality Date  . Cesarean section     Family History  Problem Relation Age of Onset  . Stroke Mother   . Hypertension Mother   . Hypertension Father   . Hyperlipidemia Mother   . Hyperlipidemia Father    History  Substance Use Topics  . Smoking status: Current Every Day Smoker -- 1.00 packs/day for 10 years    Types: Cigarettes  . Smokeless tobacco: Not on file  . Alcohol Use: No   OB History   Grav Para Term Preterm Abortions TAB SAB Ect  Mult Living                 Review of Systems  Constitutional: Negative for fever.  HENT: Negative for congestion and sore throat.   Eyes: Negative.   Respiratory: Negative for chest tightness and shortness of breath.   Cardiovascular: Negative for chest pain.  Gastrointestinal: Negative for nausea and abdominal pain.  Genitourinary: Negative.   Musculoskeletal: Positive for arthralgias. Negative for joint swelling and neck pain.  Skin: Negative.  Negative for rash and wound.  Neurological: Positive for weakness. Negative for dizziness, light-headedness, numbness and headaches.  Psychiatric/Behavioral: Negative.       Allergies  Codeine  Home Medications   Current Outpatient Rx  Name  Route  Sig  Dispense  Refill  . aspirin 325 MG tablet   Oral   Take 1 tablet (325 mg total) by mouth daily.   30 tablet   2   . atorvastatin (LIPITOR) 20 MG tablet   Oral   Take 1 tablet (20 mg total) by mouth daily at 6 PM.   30 tablet   2   . esomeprazole (NEXIUM) 20 MG capsule   Oral   Take 20 mg by mouth daily at 12 noon.         . hydrochlorothiazide (HYDRODIURIL) 25 MG tablet   Oral  Take 1 tablet (25 mg total) by mouth daily.   90 tablet   3   . lisinopril (PRINIVIL,ZESTRIL) 40 MG tablet   Oral   Take 1 tablet (40 mg total) by mouth daily.   30 tablet   2   . metFORMIN (GLUCOPHAGE) 500 MG tablet   Oral   Take 1 tablet (500 mg total) by mouth 2 (two) times daily with a meal.   30 tablet   2   . cyclobenzaprine (FLEXERIL) 5 MG tablet   Oral   Take 1 tablet (5 mg total) by mouth 3 (three) times daily as needed for muscle spasms.   15 tablet   0    BP 169/64  Pulse 62  Temp(Src) 97.8 F (36.6 C) (Oral)  Resp 12  SpO2 93% Physical Exam  Nursing note and vitals reviewed. Constitutional: She is oriented to person, place, and time. She appears well-developed and well-nourished.  HENT:  Head: Normocephalic and atraumatic.  Mouth/Throat: Oropharynx is clear  and moist.  Eyes: EOM are normal. Pupils are equal, round, and reactive to light.  Neck: Normal range of motion. Neck supple.  Cardiovascular: Normal rate and normal heart sounds.   Pulmonary/Chest: Effort normal.  Abdominal: Soft. There is no tenderness.  Musculoskeletal: Normal range of motion. She exhibits tenderness.  Pain in right groin with passive external rotation of the right hip.  Lymphadenopathy:    She has no cervical adenopathy.  Neurological: She is alert and oriented to person, place, and time. She has normal strength. No cranial nerve deficit or sensory deficit. Gait normal. GCS eye subscore is 4. GCS verbal subscore is 5. GCS motor subscore is 6.  Right eyelid droop (improving from recent cva)  Cranial nerves III-XII otherwise intact. intact.  No pronator drift.  Equal grip strength.  Weakness in her right hip flexors.  Pt unable to lift the right leg without using her hands.  Planter flexion equal, 5/5 ankle flexors and extensors.   Skin: Skin is warm and dry. No rash noted.  Psychiatric: She has a normal mood and affect. Her speech is normal and behavior is normal. Thought content normal. Cognition and memory are normal.    ED Course  Procedures (including critical care time) Labs Review Labs Reviewed  BASIC METABOLIC PANEL - Abnormal; Notable for the following:    Glucose, Bld 190 (*)    BUN 28 (*)    All other components within normal limits  CBC WITH DIFFERENTIAL   Imaging Review Ct Abdomen Pelvis Wo Contrast  10/30/2013   CLINICAL DATA:  Right groin pain  EXAM: CT ABDOMEN AND PELVIS WITHOUT CONTRAST  TECHNIQUE: Multidetector CT imaging of the abdomen and pelvis was performed following the standard protocol without IV contrast.  COMPARISON:  None.  FINDINGS: No hydronephrosis. Vascular calcifications within the left renal sinus. No definite renal calculi.  Bladder is decompressed.  The liver, gallbladder, spleen, pancreas, adrenal glands are within normal limits.   Normal appendix. Diverticulosis of the descending and sigmoid colon. Mild stranding in the fat adjacent to the sigmoid colon and haziness of the wall of the colon. See images 67-73. No abscess. No extraluminal bowel gas.  No acute bony deformity. L5-S1 facet arthropathy. Shallow L4-5 disc protrusion.  Atherosclerotic changes of the aorta and iliac arteries.  Prominent degenerative changes of the right hip joint are present. Small joint effusion is noted as well.  IMPRESSION: Findings are worrisome for mild acute sigmoid diverticulitis. Alternatively, this may be a chronic  finding. Prior studies are not available for comparison. Correlate clinically.  No evidence of urinary obstruction.  Degenerative change over the right hip joint with a small joint effusion   Electronically Signed   By: Maryclare Bean M.D.   On: 10/30/2013 14:57   Dg Hip Complete Right  10/30/2013   CLINICAL DATA:  Right hip pain  EXAM: RIGHT HIP - COMPLETE 2+ VIEW  COMPARISON:  None.  FINDINGS: Degenerative osteoarthritis of the right hip. No acute fracture or malalignment. Hips appear symmetric. Bony pelvis intact. No diastases. Mild SI joint arthropathy bilaterally.  IMPRESSION: Left hip osteoarthritis.  No acute osseous finding   Electronically Signed   By: Daryll Brod M.D.   On: 10/30/2013 13:31    EKG Interpretation   None       MDM   Final diagnoses:  Hip pain, right  Hypertension    Patients labs and/or radiological studies were viewed and considered during the medical decision making and disposition process. Pt was seen by Dr Ashok Cordia, results discussed.  Advised pt of f/u with pcp, neurology as previously scheduled.  She was advised of CT findings and referral given to ortho to f/u this week.  She has had no fevers, no injury or falls, the hip can be internally rotated without pain, so doubt septic joint.  She prefers non narcotic tx,  States took a flexeril of her daughters which helped her sleep last night,  Prescribed  same,  Also suggested tylenol and heating pad.  Continue with new htn meds (dg has already picked these up).    Evalee Jefferson, PA-C 10/30/13 [phone]

## 2013-10-30 NOTE — Progress Notes (Signed)
Patient ID: Tina Gomez, female   DOB: 13-Feb-1956, 58 y.o.   MRN: [id number]   CC:  HPI: 58 year old female here to establish care.On 10/22/2013 she was sitting in her living room when she tried to write something down. She noted her writing was scribbled and then noted a fullness sensation in her right face, blurred vision and abnormal speech. Family members arrived at the house and noted her speech was abnormal and she had a facial droop. EMS was called and code stroke was called. En route her BP was 240/119, BG 210. On arrival BP remained elevated at 214/109, she continued to have dysarthria, right arm drift , decreased sensation in right arm and face.  MRI confirmed ischemic infarct in the acute infarction affecting the posterior limb internal capsule on the left without hemorrhage. She received TPA, and was placed in the ICU on IV drips to lower her blood pressure. Started on aspirin 325 mg a day for secondary stroke prevention. Apparently the patient's dysarthria and right upper and right lower extremity weakness did improve during the hospitalization however since discharge the patient has not had any physical therapy. She feels that her right lower extremity weakness has progressed. Her blood pressure has been running in the 180s to 190s today her blood pressure is 0000000 systolic. She was also diagnosed with diabetes with an A1c of 9.4. LDL was 126 Urine drug screen positive for marijuana   Social history she quit smoking after the stroke, does not drink     Allergies  Allergen Reactions  . Codeine Nausea And Vomiting   Past Medical History  Diagnosis Date  . Hypertension    Current Outpatient Prescriptions on File Prior to Visit  Medication Sig Dispense Refill  . aspirin 325 MG tablet Take 1 tablet (325 mg total) by mouth daily.  30 tablet  2  . metFORMIN (GLUCOPHAGE) 500 MG tablet Take 1 tablet (500 mg total) by mouth 2 (two) times daily with a meal.  30 tablet  2   No current  facility-administered medications on file prior to visit.   Family History  Problem Relation Age of Onset  . Stroke Mother   . Hypertension Mother   . Hypertension Father   . Hyperlipidemia Mother   . Hyperlipidemia Father    History   Social History  . Marital Status: Divorced    Spouse Name: N/A    Number of Children: N/A  . Years of Education: N/A   Occupational History  . Not on file.   Social History Main Topics  . Smoking status: Current Every Day Smoker -- 1.00 packs/day for 10 years    Types: Cigarettes  . Smokeless tobacco: Not on file  . Alcohol Use: No  . Drug Use: Not on file  . Sexual Activity: Not on file   Other Topics Concern  . Not on file   Social History Narrative  . No narrative on file    Review of Systems  Constitutional: As in history of present illness  HENT: Negative for ear pain, nosebleeds, congestion, facial swelling, rhinorrhea, neck pain, neck stiffness and ear discharge.   Eyes: Negative for pain, discharge, redness, itching and visual disturbance.  Respiratory: Negative for cough, choking, chest tightness, shortness of breath, wheezing and stridor.   Cardiovascular: Negative for chest pain, palpitations and leg swelling.  Gastrointestinal: Negative for abdominal distention.  Genitourinary: Negative for dysuria, urgency, frequency, hematuria, flank pain, decreased urine volume, difficulty urinating and dyspareunia.  Musculoskeletal: Negative for  back pain, joint swelling, arthralgias and gait problem.  Neurological: As in history of present illness Hematological: Negative for adenopathy. Does not bruise/bleed easily.  Psychiatric/Behavioral: Negative for hallucinations, behavioral problems, confusion, dysphoric mood, decreased concentration and agitation.    Objective:   Filed Vitals:   10/30/13 1033  BP: 204/84  Pulse: 66  Temp: 98.1 F (36.7 C)    Physical Exam  Constitutional: Appears well-developed and well-nourished. No  distress.  HENT: Normocephalic. External right and left ear normal. Oropharynx is clear and moist.  Eyes: Conjunctivae and EOM are normal. PERRLA, no scleral icterus.  Neck: Normal ROM. Neck supple. No JVD. No tracheal deviation. No thyromegaly.  CVS: RRR, S1/S2 +, no murmurs, no gallops, no carotid bruit.  Pulmonary: Effort and breath sounds normal, no stridor, rhonchi, wheezes, rales.  Abdominal: Soft. BS +,  no distension, tenderness, rebound or guarding.  Musculoskeletal: Normal range of motion. No edema and no tenderness.  Lymphadenopathy: No lymphadenopathy noted, cervical, inguinal. Neuro: Upper extremity 4- to 5/5 R, She does have a mild right pronator drift, fine finger movements are mildly impaired; Left: Upper extremity 5/5  Lower extremity R4+/5; Lower extremity 5/5   Skin: Skin is warm and dry. No rash noted. Not diaphoretic. No erythema. No pallor.  Psychiatric: Normal mood and affect. Behavior, judgment, thought content normal.   Lab Results  Component Value Date   WBC 13.1* 10/22/2013   HGB 15.0 10/22/2013   HCT 44.0 10/22/2013   MCV 85.1 10/22/2013   PLT 280 10/22/2013   Lab Results  Component Value Date   CREATININE 1.00 10/22/2013   BUN 20 10/22/2013   NA 140 10/22/2013   K 3.8 10/22/2013   CL 102 10/22/2013   CO2 22 10/22/2013    Lab Results  Component Value Date   HGBA1C 9.4* 10/23/2013   Lipid Panel     Component Value Date/Time   CHOL 204* 10/23/2013 0250   TRIG 208* 10/23/2013 0250   HDL 36* 10/23/2013 0250   CHOLHDL 5.7 10/23/2013 0250   VLDL 42* 10/23/2013 0250   LDLCALC 126* 10/23/2013 0250       Assessment and plan:   Patient Active Problem List   Diagnosis Date Noted  . CVA (cerebral infarction) 10/22/2013  . Malignant hypertension 10/22/2013   posterior limb internal capsule on the left s/p IV tPA Right eye visual field defect Ophthalmology consultation referral will be provided  Concerned about progressive right leg weakness Concerned about  progression of her stroke in the setting of uncontrolled blood pressure which is 204 today She needs a stat repeat CT of the head Possible neurology consultation   Accelerated hypertension She was started on lisinopril 10 mg Increase it to 40 mg a day 25 mg Hydrochlorothiazide will also be added   Diabetes A1c of 9.4 Continue metformin  Patient will be followed closely in one to 2 weeks        The patient was given clear instructions to go to ER or return to medical center if symptoms don't improve, worsen or new problems develop. The patient verbalized understanding. The patient was told to call to get any lab results if not heard anything in the next week.

## 2013-10-30 NOTE — ED Notes (Addendum)
Pt states she was given BP meds at [facility].  I cannot find any documentation of this, but her BP is coming down.  Pt states she thinks they gave her 0.3 or 0.5 mg of Clonidine.

## 2013-10-30 NOTE — Discharge Instructions (Signed)
Hip Pain The hips join the upper legs to the lower pelvis. The bones, cartilage, tendons, and muscles of the hip joint perform a lot of work each day holding your body weight and allowing you to move around. Hip pain is a common symptom. It can range from a minor ache to severe pain on 1 or both hips. Pain may be felt on the inside of the hip joint near the groin, or the outside near the buttocks and upper thigh. There may be swelling or stiffness as well. It occurs more often when a person walks or performs activity. There are many reasons hip pain can develop. CAUSES  It is important to work with your caregiver to identify the cause since many conditions can impact the bones, cartilage, muscles, and tendons of the hips. Causes for hip pain include:  Broken (fractured) bones.  Separation of the thighbone from the hip socket (dislocation).  Torn cartilage of the hip joint.  Swelling (inflammation) of a tendon (tendonitis), the sac within the hip joint (bursitis), or a joint.  A weakening in the abdominal wall (hernia), affecting the nerves to the hip.  Arthritis in the hip joint or lining of the hip joint.  Pinched nerves in the back, hip, or upper thigh.  A bulging disc in the spine (herniated disc).  Rarely, bone infection or cancer. DIAGNOSIS  The location of your hip pain will help your caregiver understand what may be causing the pain. A diagnosis is based on your medical history, your symptoms, results from your physical exam, and results from diagnostic tests. Diagnostic tests may include X-ray exams, a computerized magnetic scan (magnetic resonance imaging, MRI), or bone scan. TREATMENT  Treatment will depend on the cause of your hip pain. Treatment may include:  Limiting activities and resting until symptoms improve.  Crutches or other walking supports (a cane or brace).  Ice, elevation, and compression.  Physical therapy or home exercises.  Shoe inserts or special  shoes.  Losing weight.  Medications to reduce pain.  Undergoing surgery. HOME CARE INSTRUCTIONS   Only take over-the-counter or prescription medicines for pain, discomfort, or fever as directed by your caregiver.  Put ice on the injured area:  Put ice in a plastic bag.  Place a towel between your skin and the bag.  Leave the ice on for 15-20 minutes at a time, 03-04 times a day.  Keep your leg raised (elevated) when possible to lessen swelling.  Avoid activities that cause pain.  Follow specific exercises as directed by your caregiver.  Sleep with a pillow between your legs on your most comfortable side.  Record how often you have hip pain, the location of the pain, and what it feels like. This information may be helpful to you and your caregiver.  Ask your caregiver about returning to work or sports and whether you should drive.  Follow up with your caregiver for further exams, therapy, or testing as directed. SEEK MEDICAL CARE IF:   Your pain or swelling continues or worsens after 1 week.  You are feeling unwell or have chills.  You have increasing difficulty with walking.  You have a loss of sensation or other new symptoms.  You have questions or concerns. SEEK IMMEDIATE MEDICAL CARE IF:   You cannot put weight on the affected hip.  You have fallen.  You have a sudden increase in pain and swelling in your hip.  You have a fever. MAKE SURE YOU:   Understand these instructions.  Will watch your condition.  Will get help right away if you are not doing well or get worse. Document Released: 02/13/2010 Document Revised: 11/18/2011 Document Reviewed: 02/13/2010 [facility] Patient Information 2014 [place].   Continue taking your increased doses of your blood pressure medicines as prescribed by your doctor this morning.  Use the Flexeril prescribed for your hip pain, in addition I suggest a trial of Tylenol for pain relief.  Call Dr. Ninfa Linden for  recheck of your hip this week.  In the interim, return here for any worsened symptoms.

## 2013-10-31 NOTE — ED Provider Notes (Signed)
Medical screening examination/treatment/procedure(s) were conducted as a shared visit with non-physician practitioner(s) and myself.  I personally evaluated the patient during the encounter.  EKG Interpretation   None       Pt with recent admission for cva.  Had her first, routine follow up at [facility] today.  During that visit was found to be hypertensive, and was sent to ED.  Pt states her dysarthria and RUE/RLE weakness has improved since recent cva admission, but states the right hip pain she developed while in hospital has persisted.  States hip pain is worse w standing/wt bearing. Denies back or radicular pain. No new numbness/weakness. No fever or chills. No swelling or redness to area.  On exam, good passive rom right hip and knee without pain, pt afeb, no findings on exam c/w septic joint.  Spine nt. abd soft nt. Distal pulses palp. Pt ambulatory, although favors right hip. r deltoid, tricep, bicep, wrist flex/ext 5/5.  Right hip flex limited due to pain. Flex/ext at knee, dorsi/plant flex at ankle and great toe 5/5. Speech clear/fluent.   Hip xr w deg changes. Given symptoms starting when in hospital, had recd tpa, will get ct pelvis r/o retroperitoneal hem, iliopsoas hem, etc.   Mirna Mires, MD 10/31/13 [phone]

## 2013-11-01 ENCOUNTER — Other Ambulatory Visit: Payer: Self-pay | Admitting: Emergency Medicine

## 2013-11-01 MED ORDER — GLUCOSE BLOOD VI STRP: ORAL_STRIP | Status: DC

## 2013-11-01 MED ORDER — FREESTYLE LANCETS MISC: Status: DC

## 2013-11-01 MED ORDER — BLOOD GLUCOSE METER KIT: PACK | Status: DC

## 2013-11-18 ENCOUNTER — Encounter: Payer: Self-pay | Admitting: Internal Medicine

## 2013-11-18 ENCOUNTER — Ambulatory Visit: Payer: Self-pay | Attending: Internal Medicine | Admitting: Internal Medicine

## 2013-11-18 VITALS — BP 161/80 | HR 71 | Temp 97.8°F | Resp 16 | Ht 63.0 in | Wt 205.0 lb

## 2013-11-18 DIAGNOSIS — Z09 Encounter for follow-up examination after completed treatment for conditions other than malignant neoplasm: Secondary | ICD-10-CM | POA: Insufficient documentation

## 2013-11-18 DIAGNOSIS — E119 Type 2 diabetes mellitus without complications: Secondary | ICD-10-CM | POA: Insufficient documentation

## 2013-11-18 DIAGNOSIS — I10 Essential (primary) hypertension: Secondary | ICD-10-CM | POA: Insufficient documentation

## 2013-11-18 DIAGNOSIS — Z8673 Personal history of transient ischemic attack (TIA), and cerebral infarction without residual deficits: Secondary | ICD-10-CM | POA: Insufficient documentation

## 2013-11-18 MED ORDER — ASPIRIN 325 MG PO TABS
325.0000 mg | ORAL_TABLET | Freq: Every day | ORAL | Status: DC
Start: 2013-11-18 — End: 2018-11-25

## 2013-11-18 MED ORDER — METFORMIN HCL 500 MG PO TABS: 500.0000 mg | ORAL_TABLET | Freq: Two times a day (BID) | ORAL | Status: DC

## 2013-11-18 MED ORDER — CYCLOBENZAPRINE HCL 5 MG PO TABS: 5.0000 mg | ORAL_TABLET | Freq: Three times a day (TID) | ORAL | Status: DC | PRN

## 2013-11-18 MED ORDER — ATORVASTATIN CALCIUM 20 MG PO TABS: 20.0000 mg | ORAL_TABLET | Freq: Every day | ORAL | Status: DC

## 2013-11-18 MED ORDER — LISINOPRIL 40 MG PO TABS: 40.0000 mg | ORAL_TABLET | Freq: Every day | ORAL | Status: DC

## 2013-11-18 MED ORDER — HYDROCHLOROTHIAZIDE 25 MG PO TABS: 25.0000 mg | ORAL_TABLET | Freq: Every day | ORAL | Status: DC

## 2013-11-18 NOTE — Progress Notes (Signed)
Pt here f/u HTN crisis with bp recheck Pt is taking medications daily Denies cp or h/a Home readings 140-150's/60-70's C/o right hip pain radiating to thigh s/p stroke

## 2013-11-18 NOTE — Progress Notes (Signed)
Patient ID: Tina Gomez, female   DOB: 1956/01/28, 58 y.o.   MRN: [id number]   CC:  Followup  HPI: Patient is 58 year old female who presents to clinic for followup. She was recently hospitalized for hypertensive urgency and stroke. Patient explains she has been taking medicines for diabetes and hypertension as prescribed. She also explains that she has been checking blood pressure regularly and the numbers are usually less than 140/90. She denies chest pain or shortness of breath, no specific abdominal or urinary concerns. She still has right side weakness but explains it is getting better with physical therapy.  Allergies  Allergen Reactions  . Codeine Nausea And Vomiting   Past Medical History  Diagnosis Date  . Hypertension   . Stroke    Current Outpatient Prescriptions on File Prior to Visit  Medication Sig Dispense Refill  . esomeprazole (NEXIUM) 20 MG capsule Take 20 mg by mouth daily at 12 noon.      . Blood Glucose Monitoring Suppl (BLOOD GLUCOSE METER) kit Use as instructed  1 each  0  . glucose blood (TRUETEST TEST) test strip Use as instructed  100 each  12  . Lancets (FREESTYLE) lancets Use as instructed  100 each  12   No current facility-administered medications on file prior to visit.   Family History  Problem Relation Age of Onset  . Stroke Mother   . Hypertension Mother   . Hypertension Father   . Hyperlipidemia Mother   . Hyperlipidemia Father    History   Social History  . Marital Status: Divorced    Spouse Name: N/A    Number of Children: N/A  . Years of Education: N/A   Occupational History  . Not on file.   Social History Main Topics  . Smoking status: Current Every Day Smoker -- 1.00 packs/day for 10 years    Types: Cigarettes  . Smokeless tobacco: Not on file  . Alcohol Use: No  . Drug Use: Not on file  . Sexual Activity: Not on file   Other Topics Concern  . Not on file   Social History Narrative  . No narrative on file    Review  of Systems  Constitutional: Negative for fever, chills, diaphoresis, activity change, appetite change and fatigue.  HENT: Negative for ear pain, nosebleeds, congestion, facial swelling, rhinorrhea, neck pain, neck stiffness and ear discharge.   Eyes: Negative for pain, discharge, redness, itching and visual disturbance.  Respiratory: Negative for cough, choking, chest tightness, shortness of breath, wheezing and stridor.   Cardiovascular: Negative for chest pain, palpitations and leg swelling.  Gastrointestinal: Negative for abdominal distention.  Genitourinary: Negative for dysuria, urgency, frequency, hematuria, flank pain, decreased urine volume, difficulty urinating and dyspareunia.  Musculoskeletal: Negative for back pain, joint swelling, arthralgias and gait problem.  Neurological: Negative for dizziness, tremors, seizures, syncope, facial asymmetry, speech difficulty, weakness, light-headedness, numbness and headaches.  Hematological: Negative for adenopathy. Does not bruise/bleed easily.  Psychiatric/Behavioral: Negative for hallucinations, behavioral problems, confusion, dysphoric mood, decreased concentration and agitation.    Objective:   Filed Vitals:   11/18/13 0912  BP: 161/80  Pulse: 71  Temp: 97.8 F (36.6 C)  Resp: 16    Physical Exam  Constitutional: Appears well-developed and well-nourished. No distress.  HENT: Normocephalic. External right and left ear normal. Oropharynx is clear and moist.  Eyes: Conjunctivae and EOM are normal. PERRLA, no scleral icterus.  Neck: Normal ROM. Neck supple. No JVD. No tracheal deviation. No thyromegaly.  CVS:  RRR, S1/S2 +, no murmurs, no gallops, no carotid bruit.  Pulmonary: Effort and breath sounds normal, no stridor, rhonchi, wheezes, rales.  Abdominal: Soft. BS +,  no distension, tenderness, rebound or guarding.  Musculoskeletal: Normal range of motion. No edema and no tenderness.  Lymphadenopathy: No lymphadenopathy noted,  cervical, inguinal. Neuro: Alert. Normal reflexes, muscle tone coordination. No cranial nerve deficit. Skin: Skin is warm and dry. No rash noted. Not diaphoretic. No erythema. No pallor.  Psychiatric: Normal mood and affect. Behavior, judgment, thought content normal.   Lab Results  Component Value Date   WBC 8.8 10/30/2013   HGB 13.1 10/30/2013   HCT 38.4 10/30/2013   MCV 85.9 10/30/2013   PLT 250 10/30/2013   Lab Results  Component Value Date   CREATININE 0.77 10/30/2013   BUN 28* 10/30/2013   NA 140 10/30/2013   K 4.1 10/30/2013   CL 102 10/30/2013   CO2 25 10/30/2013    Lab Results  Component Value Date   HGBA1C 9.4* 10/23/2013   Lipid Panel     Component Value Date/Time   CHOL 204* 10/23/2013 0250   TRIG 208* 10/23/2013 0250   HDL 36* 10/23/2013 0250   CHOLHDL 5.7 10/23/2013 0250   VLDL 42* 10/23/2013 0250   LDLCALC 126* 10/23/2013 0250       Assessment and plan:   Patient Active Problem List   Diagnosis Date Noted  . CVA (cerebral infarction) 10/22/2013  . Malignant hypertension 10/22/2013   - Blood pressure better controlled and, patient advised to continue taking blood pressure medicines as prescribed and to continue monitoring blood pressure regularly - Will provide additional refills on blood pressure medications - Continue physical therapy - Diabetes diet discussed, foot care discussed in detail, patient advised to continue checking sugar levels regularly, continue metformin as originally prescribed

## 2013-11-18 NOTE — Patient Instructions (Signed)
Diabetes Meal Planning Guide The diabetes meal planning guide is a tool to help you plan your meals and snacks. It is important for people with diabetes to manage their blood glucose (sugar) levels. Choosing the right foods and the right amounts throughout your day will help control your blood glucose. Eating right can even help you improve your blood pressure and reach or maintain a healthy weight. CARBOHYDRATE COUNTING MADE EASY When you eat carbohydrates, they turn to sugar. This raises your blood glucose level. Counting carbohydrates can help you control this level so you feel better. When you plan your meals by counting carbohydrates, you can have more flexibility in what you eat and balance your medicine with your food intake. Carbohydrate counting simply means adding up the total amount of carbohydrate grams in your meals and snacks. Try to eat about the same amount at each meal. Foods with carbohydrates are listed below. Each portion below is 1 carbohydrate serving or 15 grams of carbohydrates. Ask your dietician how many grams of carbohydrates you should eat at each meal or snack. Grains and Starches  1 slice bread.   English muffin or hotdog/hamburger bun.   cup cold cereal (unsweetened).   cup cooked pasta or rice.   cup starchy vegetables (corn, potatoes, peas, beans, winter squash).  1 tortilla (6 inches).   bagel.  1 waffle or pancake (size of a CD).   cup cooked cereal.  4 to 6 small crackers. *Whole grain is recommended. Fruit  1 cup fresh unsweetened berries, melon, papaya, pineapple.  1 small fresh fruit.   banana or mango.   cup fruit juice (4 oz unsweetened).   cup canned fruit in natural juice or water.  2 tbs dried fruit.  12 to 15 grapes or cherries. Milk and Yogurt  1 cup fat-free or 1% milk.  1 cup soy milk.  6 oz light yogurt with sugar-free sweetener.  6 oz low-fat soy yogurt.  6 oz plain yogurt. Vegetables  1 cup raw or  cup  cooked is counted as 0 carbohydrates or a "free" food.  If you eat 3 or more servings at 1 meal, count them as 1 carbohydrate serving. Other Carbohydrates   oz chips or pretzels.   cup ice cream or frozen yogurt.   cup sherbet or sorbet.  2 inch square cake, no frosting.  1 tbs honey, sugar, jam, jelly, or syrup.  2 small cookies.  3 squares of graham crackers.  3 cups popcorn.  6 crackers.  1 cup broth-based soup.  Count 1 cup casserole or other mixed foods as 2 carbohydrate servings.  Foods with less than 20 calories in a serving may be counted as 0 carbohydrates or a "free" food. You may want to purchase a book or computer software that lists the carbohydrate gram counts of different foods. In addition, the nutrition facts panel on the labels of the foods you eat are a good source of this information. The label will tell you how big the serving size is and the total number of carbohydrate grams you will be eating per serving. Divide this number by 15 to obtain the number of carbohydrate servings in a portion. Remember, 1 carbohydrate serving equals 15 grams of carbohydrate. SERVING SIZES Measuring foods and serving sizes helps you make sure you are getting the right amount of food. The list below tells how big or small some common serving sizes are.  1 oz.........4 stacked dice.  3 oz.........Deck of cards.  1 tsp........Tip   of little finger.  1 tbs......Marland KitchenMarland KitchenThumb.  2 tbs.......Marland KitchenGolf ball.   cup......Marland KitchenHalf of a fist.  1 cup.......Marland KitchenA fist. SAMPLE DIABETES MEAL PLAN Below is a sample meal plan that includes foods from the grain and starches, dairy, vegetable, fruit, and meat groups. A dietician can individualize a meal plan to fit your calorie needs and tell you the number of servings needed from each food group. However, controlling the total amount of carbohydrates in your meal or snack is more important than making sure you include all of the food groups at every  meal. You may interchange carbohydrate containing foods (dairy, starches, and fruits). The meal plan below is an example of a 2000 calorie diet using carbohydrate counting. This meal plan has 17 carbohydrate servings. Breakfast  1 cup oatmeal (2 carb servings).   cup light yogurt (1 carb serving).  1 cup blueberries (1 carb serving).   cup almonds. Snack  1 large apple (2 carb servings).  1 low-fat string cheese stick. Lunch  Chicken breast salad.  1 cup spinach.   cup chopped tomatoes.  2 oz chicken breast, sliced.  2 tbs low-fat [place] dressing.  12 whole-wheat crackers (2 carb servings).  12 to 15 grapes (1 carb serving).  1 cup low-fat milk (1 carb serving). Snack  1 cup carrots.   cup hummus (1 carb serving). Dinner  3 oz broiled salmon.  1 cup brown rice (3 carb servings). Snack  1  cups steamed broccoli (1 carb serving) drizzled with 1 tsp olive oil and lemon juice.  1 cup light pudding (2 carb servings). DIABETES MEAL PLANNING WORKSHEET Your dietician can use this worksheet to help you decide how many servings of foods and what types of foods are right for you.  BREAKFAST Food Group and Servings / Carb Servings Grain/Starches __________________________________ Dairy __________________________________________ Vegetable ______________________________________ Fruit ___________________________________________ Meat __________________________________________ Fat ____________________________________________ LUNCH Food Group and Servings / Carb Servings Grain/Starches ___________________________________ Dairy ___________________________________________ Fruit ____________________________________________ Meat ___________________________________________ Fat _____________________________________________ Tina Gomez Food Group and Servings / Carb Servings Grain/Starches ___________________________________ Dairy  ___________________________________________ Fruit ____________________________________________ Meat ___________________________________________ Fat _____________________________________________ SNACKS Food Group and Servings / Carb Servings Grain/Starches ___________________________________ Dairy ___________________________________________ Vegetable _______________________________________ Fruit ____________________________________________ Meat ___________________________________________ Fat _____________________________________________ DAILY TOTALS Starches _________________________ Vegetable ________________________ Fruit ____________________________ Dairy ____________________________ Meat ____________________________ Fat ______________________________ Document Released: 05/23/2005 Document Revised: 11/18/2011 Document Reviewed: 04/03/2009 [employer] Patient Information 2014 [employer]. Diabetes and Foot Care Diabetes may cause you to have problems because of poor blood supply (circulation) to your feet and legs. This may cause the skin on your feet to become thinner, break easier, and heal more slowly. Your skin may become dry, and the skin may peel and crack. You may also have nerve damage in your legs and feet causing decreased feeling in them. You may not notice minor injuries to your feet that could lead to infections or more serious problems. Taking care of your feet is one of the most important things you can do for yourself.  HOME CARE INSTRUCTIONS  Wear shoes at all times, even in the house. Do not go barefoot. Bare feet are easily injured.  Check your feet daily for blisters, cuts, and redness. If you cannot see the bottom of your feet, use a mirror or ask someone for help.  Wash your feet with warm water (do not use hot water) and mild soap. Then pat your feet and the areas between your toes until they are completely dry. Do not soak your feet as this can dry your  skin.  Apply a moisturizing lotion or petroleum jelly (that does not contain alcohol and is unscented) to the skin on your feet and to dry, brittle toenails. Do not apply lotion between your toes.  Trim your toenails straight across. Do not dig under them or around the cuticle. File the edges of your nails with an emery board or nail file.  Do not cut corns or calluses or try to remove them with medicine.  Wear clean socks or stockings every day. Make sure they are not too tight. Do not wear knee-high stockings since they may decrease blood flow to your legs.  Wear shoes that fit properly and have enough cushioning. To break in new shoes, wear them for just a few hours a day. This prevents you from injuring your feet. Always look in your shoes before you put them on to be sure there are no objects inside.  Do not cross your legs. This may decrease the blood flow to your feet.  If you find a minor scrape, cut, or break in the skin on your feet, keep it and the skin around it clean and dry. These areas may be cleansed with mild soap and water. Do not cleanse the area with peroxide, alcohol, or iodine.  When you remove an adhesive bandage, be sure not to damage the skin around it.  If you have a wound, look at it several times a day to make sure it is healing.  Do not use heating pads or hot water bottles. They may burn your skin. If you have lost feeling in your feet or legs, you may not know it is happening until it is too late.  Make sure your health care provider performs a complete foot exam at least annually or more often if you have foot problems. Report any cuts, sores, or bruises to your health care provider immediately. SEEK MEDICAL CARE IF:   You have an injury that is not healing.  You have cuts or breaks in the skin.  You have an ingrown nail.  You notice redness on your legs or feet.  You feel burning or tingling in your legs or feet.  You have pain or cramps in your  legs and feet.  Your legs or feet are numb.  Your feet always feel cold. SEEK IMMEDIATE MEDICAL CARE IF:   There is increasing redness, swelling, or pain in or around a wound.  There is a red line that goes up your leg.  Pus is coming from a wound.  You develop a fever or as directed by your health care provider.  You notice a bad smell coming from an ulcer or wound. Document Released: 08/23/2000 Document Revised: 04/28/2013 Document Reviewed: 02/02/2013 [facility] Patient Information 2014 [place]. Diabetes and Exercise Exercising regularly is important. It is not just about losing weight. It has many health benefits, such as:  Improving your overall fitness, flexibility, and endurance.  Increasing your bone density.  Helping with weight control.  Decreasing your body fat.  Increasing your muscle strength.  Reducing stress and tension.  Improving your overall health. People with diabetes who exercise gain additional benefits because exercise:  Reduces appetite.  Improves the body's use of blood sugar (glucose).  Helps lower or control blood glucose.  Decreases blood pressure.  Helps control blood lipids (such as cholesterol and triglycerides).  Improves the body's use of the hormone insulin by:  Increasing the body's insulin sensitivity.  Reducing the body's insulin needs.  Decreases  the risk for heart disease because exercising:  Lowers cholesterol and triglycerides levels.  Increases the levels of good cholesterol (such as high-density lipoproteins [HDL]) in the body.  Lowers blood glucose levels. YOUR ACTIVITY PLAN  Choose an activity that you enjoy and set realistic goals. Your health care provider or diabetes educator can help you make an activity plan that works for you. You can break activities into 2 or 3 sessions throughout the day. Doing so is as good as one long session. Exercise ideas include:  Taking the dog for a walk.  Taking the  stairs instead of the elevator.  Dancing to your favorite song.  Doing your favorite exercise with a friend. RECOMMENDATIONS FOR EXERCISING WITH TYPE 1 OR TYPE 2 DIABETES   Check your blood glucose before exercising. If blood glucose levels are greater than 240 mg/dL, check for urine ketones. Do not exercise if ketones are present.  Avoid injecting insulin into areas of the body that are going to be exercised. For example, avoid injecting insulin into:  The arms when playing tennis.  The legs when jogging.  Keep a record of:  Food intake before and after you exercise.  Expected peak times of insulin action.  Blood glucose levels before and after you exercise.  The type and amount of exercise you have done.  Review your records with your health care provider. Your health care provider will help you to develop guidelines for adjusting food intake and insulin amounts before and after exercising.  If you take insulin or oral hypoglycemic agents, watch for signs and symptoms of hypoglycemia. They include:  Dizziness.  Shaking.  Sweating.  Chills.  Confusion.  Drink plenty of water while you exercise to prevent dehydration or heat stroke. Body water is lost during exercise and must be replaced.  Talk to your health care provider before starting an exercise program to make sure it is safe for you. Remember, almost any type of activity is better than none. Document Released: 11/16/2003 Document Revised: 04/28/2013 Document Reviewed: 02/02/2013 [facility] Patient Information 2014 [place].

## 2014-01-18 ENCOUNTER — Encounter: Payer: Self-pay | Admitting: Internal Medicine

## 2014-01-18 ENCOUNTER — Ambulatory Visit: Payer: Self-pay | Attending: Internal Medicine | Admitting: Internal Medicine

## 2014-01-18 VITALS — BP 178/77 | HR 66 | Temp 97.8°F | Resp 16 | Wt 195.6 lb

## 2014-01-18 DIAGNOSIS — K219 Gastro-esophageal reflux disease without esophagitis: Secondary | ICD-10-CM | POA: Insufficient documentation

## 2014-01-18 DIAGNOSIS — E785 Hyperlipidemia, unspecified: Secondary | ICD-10-CM | POA: Insufficient documentation

## 2014-01-18 DIAGNOSIS — E1140 Type 2 diabetes mellitus with diabetic neuropathy, unspecified: Secondary | ICD-10-CM | POA: Insufficient documentation

## 2014-01-18 DIAGNOSIS — Z1211 Encounter for screening for malignant neoplasm of colon: Secondary | ICD-10-CM

## 2014-01-18 DIAGNOSIS — Z8673 Personal history of transient ischemic attack (TIA), and cerebral infarction without residual deficits: Secondary | ICD-10-CM | POA: Insufficient documentation

## 2014-01-18 DIAGNOSIS — Z139 Encounter for screening, unspecified: Secondary | ICD-10-CM

## 2014-01-18 DIAGNOSIS — I10 Essential (primary) hypertension: Secondary | ICD-10-CM | POA: Insufficient documentation

## 2014-01-18 DIAGNOSIS — E119 Type 2 diabetes mellitus without complications: Secondary | ICD-10-CM | POA: Insufficient documentation

## 2014-01-18 LAB — GLUCOSE, POCT (MANUAL RESULT ENTRY): POC GLUCOSE: 158 mg/dL — AB (ref 70–99)

## 2014-01-18 LAB — POCT GLYCOSYLATED HEMOGLOBIN (HGB A1C): Hemoglobin A1C: 6.9

## 2014-01-18 MED ORDER — AMLODIPINE BESYLATE 2.5 MG PO TABS: 2.5000 mg | ORAL_TABLET | Freq: Every day | ORAL | Status: DC

## 2014-01-18 NOTE — Progress Notes (Signed)
MRN: [id number] Name: Tina Gomez  Sex: female Age: 58 y.o. DOB: [birth year]  Allergies: Codeine  Chief Complaint  Patient presents with  . Diabetes    HPI: Patient is 58 y.o. female who has is she of diabetes, stroke, hyperlipidemia, uncontrolled hypertension comes today for followup her today also her blood pressure is elevated she denies any dizziness headache chest pain shortness of breath, as per patient she is compliant in taking medications, she denies any hypoglycemic symptoms and is taking metformin 500 mg twice a day, her hemoglobin A1c trend  is improving, she is also following up with the neurologist and is on aspirin and statins, history of GERD taking Nexium. As per patient she never had colonoscopy done and her for several years she has not had mammogram done. Patient denies any acute symptoms.  Past Medical History  Diagnosis Date  . Hypertension   . Stroke     Past Surgical History  Procedure Laterality Date  . Cesarean section        Medication List       This list is accurate as of: 01/18/14  9:45 AM.  Always use your most recent med list.               amLODipine 2.5 MG tablet  Commonly known as:  NORVASC  Take 1 tablet (2.5 mg total) by mouth daily.     aspirin 325 MG tablet  Take 1 tablet (325 mg total) by mouth daily.     atorvastatin 20 MG tablet  Commonly known as:  LIPITOR  Take 1 tablet (20 mg total) by mouth daily at 6 PM.     Blood Glucose Meter kit  Use as instructed     cyclobenzaprine 5 MG tablet  Commonly known as:  FLEXERIL  Take 1 tablet (5 mg total) by mouth 3 (three) times daily as needed for muscle spasms.     esomeprazole 20 MG capsule  Commonly known as:  NEXIUM  Take 20 mg by mouth daily at 12 noon.     freestyle lancets  Use as instructed     glucose blood test strip  Commonly known as:  TRUETEST TEST  Use as instructed     hydrochlorothiazide 25 MG tablet  Commonly known as:  HYDRODIURIL  Take 1 tablet  (25 mg total) by mouth daily.     lisinopril 40 MG tablet  Commonly known as:  PRINIVIL,ZESTRIL  Take 1 tablet (40 mg total) by mouth daily.     metFORMIN 500 MG tablet  Commonly known as:  GLUCOPHAGE  Take 1 tablet (500 mg total) by mouth 2 (two) times daily with a meal.        Meds ordered this encounter  Medications  . amLODipine (NORVASC) 2.5 MG tablet    Sig: Take 1 tablet (2.5 mg total) by mouth daily.    Dispense:  90 tablet    Refill:  3    Immunization History  Administered Date(s) Administered  . Pneumococcal Polysaccharide-23 10/23/2013    Family History  Problem Relation Age of Onset  . Stroke Mother   . Hypertension Mother   . Hypertension Father   . Hyperlipidemia Mother   . Hyperlipidemia Father     History  Substance Use Topics  . Smoking status: Current Every Day Smoker -- 1.00 packs/day for 10 years    Types: Cigarettes  . Smokeless tobacco: Not on file  . Alcohol Use: No    Review of  Systems  HENT: Negative for congestion, nosebleeds and sinus pressure.   Respiratory: Negative for cough and chest tightness.   Neurological: Negative for dizziness, speech difficulty and headaches.     As noted in HPI  Filed Vitals:   01/18/14 0925  BP: 178/77  Pulse: 66  Temp:   Resp:     Physical Exam  Physical Exam  Constitutional:  Obese female sitting comfortably not in acute distress  Eyes: EOM are normal. Pupils are equal, round, and reactive to light.  Neck: Neck supple.  Cardiovascular: Normal rate and regular rhythm.   Pulmonary/Chest: Breath sounds normal. No respiratory distress. She has no wheezes. She has no rales.  Abdominal: Soft. There is no tenderness. There is no rebound.  Musculoskeletal: She exhibits no edema.    CBC    Component Value Date/Time   WBC 8.8 10/30/2013 1202   RBC 4.47 10/30/2013 1202   HGB 13.1 10/30/2013 1202   HCT 38.4 10/30/2013 1202   PLT 250 10/30/2013 1202   MCV 85.9 10/30/2013 1202   LYMPHSABS 1.8  10/30/2013 1202   MONOABS 0.8 10/30/2013 1202   EOSABS 0.1 10/30/2013 1202   BASOSABS 0.0 10/30/2013 1202    CMP     Component Value Date/Time   NA 140 10/30/2013 1202   K 4.1 10/30/2013 1202   CL 102 10/30/2013 1202   CO2 25 10/30/2013 1202   GLUCOSE 190* 10/30/2013 1202   BUN 28* 10/30/2013 1202   CREATININE 0.77 10/30/2013 1202   CALCIUM 9.3 10/30/2013 1202   PROT 7.8 10/22/2013 1554   ALBUMIN 3.6 10/22/2013 1554   AST 14 10/22/2013 1554   ALT 13 10/22/2013 1554   ALKPHOS 133* 10/22/2013 1554   BILITOT 0.3 10/22/2013 1554   GFRNONAA >90 10/30/2013 1202   GFRAA >90 10/30/2013 1202    Lab Results  Component Value Date/Time   CHOL 204* 10/23/2013  2:50 AM    No components found with this basename: hga1c    Lab Results  Component Value Date/Time   AST 14 10/22/2013  3:54 PM    Assessment and Plan  DM (diabetes mellitus) - Plan:  Results for orders placed in visit on 01/18/14  GLUCOSE, POCT (MANUAL RESULT ENTRY)      Result Value Ref Range   POC Glucose 158 (*) 70 - 99 mg/dl  POCT GLYCOSYLATED HEMOGLOBIN (HGB A1C)      Result Value Ref Range   Hemoglobin A1C 6.9     Diabetes is improving, hemoglobin A1c has trended down to 6.9%, continue with metformin 500 mg 3 times a day, also advise for diabetes meal planning.  Essential hypertension, benign/uncontrolled - Plan: I have advised patient for DASH diet, continue with lisinopril 40 mg, hydrochlorothiazide 25 mg, I have added low-dose amLODipine (NORVASC) 2.5 MG tablet, she'll come back in 2 weeks for blood pressure check.  Other and unspecified hyperlipidemia Continue statins, will repeat fasting lipid panel on next visit  Special screening for malignant neoplasms, colon - Plan: Ambulatory referral to Gastroenterology  Screening - Plan: MM DIGITAL SCREENING BILATERAL  GERD (gastroesophageal reflux disease) Continue with Nexium, lifestyle modification.   Health Maintenance -Colonoscopy: Referred to GI  -Mammogram: Ordered     Return in about 3 months (around 04/20/2014) for diabetes, hypertension, hyperipidemia, stroke, BP check in 2 weeks/Nurse Visit.  Lorayne Marek, MD

## 2014-01-18 NOTE — Progress Notes (Signed)
Patient here for follow up on her DM and hypertension History of stroke this past February Since stroke has lost almost forty pounds

## 2014-01-18 NOTE — Patient Instructions (Signed)
Diabetes Meal Planning Guide The diabetes meal planning guide is a tool to help you plan your meals and snacks. It is important for people with diabetes to manage their blood glucose (sugar) levels. Choosing the right foods and the right amounts throughout your day will help control your blood glucose. Eating right can even help you improve your blood pressure and reach or maintain a healthy weight. CARBOHYDRATE COUNTING MADE EASY When you eat carbohydrates, they turn to sugar. This raises your blood glucose level. Counting carbohydrates can help you control this level so you feel better. When you plan your meals by counting carbohydrates, you can have more flexibility in what you eat and balance your medicine with your food intake. Carbohydrate counting simply means adding up the total amount of carbohydrate grams in your meals and snacks. Try to eat about the same amount at each meal. Foods with carbohydrates are listed below. Each portion below is 1 carbohydrate serving or 15 grams of carbohydrates. Ask your dietician how many grams of carbohydrates you should eat at each meal or snack. Grains and Starches  1 slice bread.   English muffin or hotdog/hamburger bun.   cup cold cereal (unsweetened).   cup cooked pasta or rice.   cup starchy vegetables (corn, potatoes, peas, beans, winter squash).  1 tortilla (6 inches).   bagel.  1 waffle or pancake (size of a CD).   cup cooked cereal.  4 to 6 small crackers. *Whole grain is recommended. Fruit  1 cup fresh unsweetened berries, melon, papaya, pineapple.  1 small fresh fruit.   banana or mango.   cup fruit juice (4 oz unsweetened).   cup canned fruit in natural juice or water.  2 tbs dried fruit.  12 to 15 grapes or cherries. Milk and Yogurt  1 cup fat-free or 1% milk.  1 cup soy milk.  6 oz light yogurt with sugar-free sweetener.  6 oz low-fat soy yogurt.  6 oz plain yogurt. Vegetables  1 cup raw or  cup  cooked is counted as 0 carbohydrates or a "free" food.  If you eat 3 or more servings at 1 meal, count them as 1 carbohydrate serving. Other Carbohydrates   oz chips or pretzels.   cup ice cream or frozen yogurt.   cup sherbet or sorbet.  2 inch square cake, no frosting.  1 tbs honey, sugar, jam, jelly, or syrup.  2 small cookies.  3 squares of graham crackers.  3 cups popcorn.  6 crackers.  1 cup broth-based soup.  Count 1 cup casserole or other mixed foods as 2 carbohydrate servings.  Foods with less than 20 calories in a serving may be counted as 0 carbohydrates or a "free" food. You may want to purchase a book or computer software that lists the carbohydrate gram counts of different foods. In addition, the nutrition facts panel on the labels of the foods you eat are a good source of this information. The label will tell you how big the serving size is and the total number of carbohydrate grams you will be eating per serving. Divide this number by 15 to obtain the number of carbohydrate servings in a portion. Remember, 1 carbohydrate serving equals 15 grams of carbohydrate. SERVING SIZES Measuring foods and serving sizes helps you make sure you are getting the right amount of food. The list below tells how big or small some common serving sizes are.  1 oz.........4 stacked dice.  3 oz.........Deck of cards.  1 tsp........Tip   of little finger.  1 tbs........Thumb.  2 tbs........Golf ball.   cup.......Half of a fist.  1 cup........A fist. SAMPLE DIABETES MEAL PLAN Below is a sample meal plan that includes foods from the grain and starches, dairy, vegetable, fruit, and meat groups. A dietician can individualize a meal plan to fit your calorie needs and tell you the number of servings needed from each food group. However, controlling the total amount of carbohydrates in your meal or snack is more important than making sure you include all of the food groups at every  meal. You may interchange carbohydrate containing foods (dairy, starches, and fruits). The meal plan below is an example of a 2000 calorie diet using carbohydrate counting. This meal plan has 17 carbohydrate servings. Breakfast  1 cup oatmeal (2 carb servings).   cup light yogurt (1 carb serving).  1 cup blueberries (1 carb serving).   cup almonds. Snack  1 large apple (2 carb servings).  1 low-fat string cheese stick. Lunch  Chicken breast salad.  1 cup spinach.   cup chopped tomatoes.  2 oz chicken breast, sliced.  2 tbs low-fat Italian dressing.  12 whole-wheat crackers (2 carb servings).  12 to 15 grapes (1 carb serving).  1 cup low-fat milk (1 carb serving). Snack  1 cup carrots.   cup hummus (1 carb serving). Dinner  3 oz broiled salmon.  1 cup brown rice (3 carb servings). Snack  1  cups steamed broccoli (1 carb serving) drizzled with 1 tsp olive oil and lemon juice.  1 cup light pudding (2 carb servings). DIABETES MEAL PLANNING WORKSHEET Your dietician can use this worksheet to help you decide how many servings of foods and what types of foods are right for you.  BREAKFAST Food Group and Servings / Carb Servings Grain/Starches __________________________________ Dairy __________________________________________ Vegetable ______________________________________ Fruit ___________________________________________ Meat __________________________________________ Fat ____________________________________________ LUNCH Food Group and Servings / Carb Servings Grain/Starches ___________________________________ Dairy ___________________________________________ Fruit ____________________________________________ Meat ___________________________________________ Fat _____________________________________________ DINNER Food Group and Servings / Carb Servings Grain/Starches ___________________________________ Dairy  ___________________________________________ Fruit ____________________________________________ Meat ___________________________________________ Fat _____________________________________________ SNACKS Food Group and Servings / Carb Servings Grain/Starches ___________________________________ Dairy ___________________________________________ Vegetable _______________________________________ Fruit ____________________________________________ Meat ___________________________________________ Fat _____________________________________________ DAILY TOTALS Starches _________________________ Vegetable ________________________ Fruit ____________________________ Dairy ____________________________ Meat ____________________________ Fat ______________________________ Document Released: 05/23/2005 Document Revised: 11/18/2011 Document Reviewed: 04/03/2009 Exit[employer] Patient Information 2014 [employer]. DASH Diet The DASH diet stands for "Dietary Approaches to Stop Hypertension." It is a healthy eating plan that has been shown to reduce high blood pressure (hypertension) in as little as 14 days, while also possibly providing other significant health benefits. These other health benefits include reducing the risk of breast cancer after menopause and reducing the risk of type 2 diabetes, heart disease, colon cancer, and stroke. Health benefits also include weight loss and slowing kidney failure in patients with chronic kidney disease.  DIET GUIDELINES  Limit salt (sodium). Your diet should contain less than 1500 mg of sodium daily.  Limit refined or processed carbohydrates. Your diet should include mostly whole grains. Desserts and added sugars should be used sparingly.  Include small amounts of heart-healthy fats. These types of fats include nuts, oils, and tub margarine. Limit saturated and trans fats. These fats have been shown to be harmful in the body. CHOOSING FOODS  The following food groups  are based on a 2000 calorie diet. See your Registered Dietitian for individual calorie needs. Grains and Grain Products (6 to 8 servings daily)  Eat More Often:   Whole-wheat bread, brown rice, whole-grain or wheat pasta, quinoa, popcorn without added fat or salt (air popped).  Eat Less Often: White bread, white pasta, white rice, cornbread. Vegetables (4 to 5 servings daily)  Eat More Often: Fresh, frozen, and canned vegetables. Vegetables may be raw, steamed, roasted, or grilled with a minimal amount of fat.  Eat Less Often/Avoid: Creamed or fried vegetables. Vegetables in a cheese sauce. Fruit (4 to 5 servings daily)  Eat More Often: All fresh, canned (in natural juice), or frozen fruits. Dried fruits without added sugar. One hundred percent fruit juice ( cup [237 mL] daily).  Eat Less Often: Dried fruits with added sugar. Canned fruit in light or heavy syrup. Lean Meats, Fish, and Poultry (2 servings or less daily. One serving is 3 to 4 oz [85-114 g]).  Eat More Often: Ninety percent or leaner ground beef, tenderloin, sirloin. Round cuts of beef, chicken breast, turkey breast. All fish. Grill, bake, or broil your meat. Nothing should be fried.  Eat Less Often/Avoid: Fatty cuts of meat, turkey, or chicken leg, thigh, or wing. Fried cuts of meat or fish. Dairy (2 to 3 servings)  Eat More Often: Low-fat or fat-free milk, low-fat plain or light yogurt, reduced-fat or part-skim cheese.  Eat Less Often/Avoid: Milk (whole, 2%).Whole milk yogurt. Full-fat cheeses. Nuts, Seeds, and Legumes (4 to 5 servings per week)  Eat More Often: All without added salt.  Eat Less Often/Avoid: Salted nuts and seeds, canned beans with added salt. Fats and Sweets (limited)  Eat More Often: Vegetable oils, tub margarines without trans fats, sugar-free gelatin. Mayonnaise and salad dressings.  Eat Less Often/Avoid: Coconut oils, palm oils, butter, stick margarine, cream, half and half, cookies, candy,  pie. FOR MORE INFORMATION The Dash Diet Eating Plan: www.dashdiet.org Document Released: 08/15/2011 Document Revised: 11/18/2011 Document Reviewed: 08/15/2011 Exit[employer] Patient Information 2014 [employer].  

## 2014-02-03 ENCOUNTER — Ambulatory Visit: Payer: Self-pay | Attending: Internal Medicine | Admitting: *Deleted

## 2014-02-03 VITALS — BP 147/79 | HR 69 | Resp 16

## 2014-02-03 DIAGNOSIS — I10 Essential (primary) hypertension: Secondary | ICD-10-CM

## 2014-02-03 NOTE — Progress Notes (Signed)
Patient here for a Blood Pressure check. Patient has verbalized how she has made diet modifications by cutting out soda's which has cut out a lot of calories.

## 2014-02-21 ENCOUNTER — Encounter: Payer: Self-pay | Admitting: Internal Medicine

## 2014-02-21 ENCOUNTER — Ambulatory Visit: Payer: Self-pay | Admitting: Nurse Practitioner

## 2014-02-21 ENCOUNTER — Telehealth: Payer: Self-pay | Admitting: Nurse Practitioner

## 2014-02-21 NOTE — Telephone Encounter (Signed)
Patient called same day for cancellation.

## 2014-04-12 ENCOUNTER — Ambulatory Visit: Payer: Self-pay | Admitting: Nurse Practitioner

## 2014-04-12 ENCOUNTER — Telehealth: Payer: Self-pay | Admitting: Nurse Practitioner

## 2014-04-12 NOTE — Telephone Encounter (Signed)
Patient was no show for today's office appointment.  

## 2014-04-22 ENCOUNTER — Encounter: Payer: Self-pay | Admitting: Internal Medicine

## 2014-04-22 ENCOUNTER — Ambulatory Visit: Payer: Self-pay | Attending: Internal Medicine | Admitting: Internal Medicine

## 2014-04-22 VITALS — BP 171/84 | HR 89 | Temp 98.0°F | Resp 16 | Ht 63.0 in | Wt 189.0 lb

## 2014-04-22 DIAGNOSIS — N898 Other specified noninflammatory disorders of vagina: Secondary | ICD-10-CM

## 2014-04-22 DIAGNOSIS — L293 Anogenital pruritus, unspecified: Secondary | ICD-10-CM

## 2014-04-22 DIAGNOSIS — E785 Hyperlipidemia, unspecified: Secondary | ICD-10-CM

## 2014-04-22 DIAGNOSIS — I10 Essential (primary) hypertension: Secondary | ICD-10-CM

## 2014-04-22 DIAGNOSIS — K219 Gastro-esophageal reflux disease without esophagitis: Secondary | ICD-10-CM

## 2014-04-22 DIAGNOSIS — E119 Type 2 diabetes mellitus without complications: Secondary | ICD-10-CM

## 2014-04-22 LAB — POCT GLYCOSYLATED HEMOGLOBIN (HGB A1C): Hemoglobin A1C: 5.7

## 2014-04-22 LAB — GLUCOSE, POCT (MANUAL RESULT ENTRY): POC Glucose: 180 mg/dl — AB (ref 70–99)

## 2014-04-22 MED ORDER — AMLODIPINE BESYLATE 5 MG PO TABS: 5.0000 mg | ORAL_TABLET | Freq: Every day | ORAL | Status: DC

## 2014-04-22 MED ORDER — PANTOPRAZOLE SODIUM 40 MG PO TBEC: 40.0000 mg | DELAYED_RELEASE_TABLET | Freq: Every day | ORAL | Status: DC

## 2014-04-22 MED ORDER — AMLODIPINE BESYLATE 5 MG PO TABS: 2.5000 mg | ORAL_TABLET | Freq: Every day | ORAL | Status: DC

## 2014-04-22 MED ORDER — FLUCONAZOLE 150 MG PO TABS: 150.0000 mg | ORAL_TABLET | Freq: Once | ORAL | Status: DC

## 2014-04-22 NOTE — Patient Instructions (Signed)
DASH Eating Plan °DASH stands for "Dietary Approaches to Stop Hypertension." The DASH eating plan is a healthy eating plan that has been shown to reduce high blood pressure (hypertension). Additional health benefits may include reducing the risk of type 2 diabetes mellitus, heart disease, and stroke. The DASH eating plan may also help with weight loss. °WHAT DO I NEED TO KNOW ABOUT THE DASH EATING PLAN? °For the DASH eating plan, you will follow these general guidelines: °· Choose foods with a percent daily value for sodium of less than 5% (as listed on the food label). °· Use salt-free seasonings or herbs instead of table salt or sea salt. °· Check with your health care provider or pharmacist before using salt substitutes. °· Eat lower-sodium products, often labeled as "lower sodium" or "no salt added." °· Eat fresh foods. °· Eat more vegetables, fruits, and low-fat dairy products. °· Choose whole grains. Look for the word "whole" as the first word in the ingredient list. °· Choose fish and skinless chicken or turkey more often than red meat. Limit fish, poultry, and meat to 6 oz (170 g) each day. °· Limit sweets, desserts, sugars, and sugary drinks. °· Choose heart-healthy fats. °· Limit cheese to 1 oz (28 g) per day. °· Eat more home-cooked food and less restaurant, buffet, and fast food. °· Limit fried foods. °· Cook foods using methods other than frying. °· Limit canned vegetables. If you do use them, rinse them well to decrease the sodium. °· When eating at a restaurant, ask that your food be prepared with less salt, or no salt if possible. °WHAT FOODS CAN I EAT? °Seek help from a dietitian for individual calorie needs. °Grains °Whole grain or whole wheat bread. Brown rice. Whole grain or whole wheat pasta. Quinoa, bulgur, and whole grain cereals. Low-sodium cereals. Corn or whole wheat flour tortillas. Whole grain cornbread. Whole grain crackers. Low-sodium crackers. °Vegetables °Fresh or frozen vegetables  (raw, steamed, roasted, or grilled). Low-sodium or reduced-sodium tomato and vegetable juices. Low-sodium or reduced-sodium tomato sauce and paste. Low-sodium or reduced-sodium canned vegetables.  °Fruits °All fresh, canned (in natural juice), or frozen fruits. °Meat and Other Protein Products °Ground beef (85% or leaner), grass-fed beef, or beef trimmed of fat. Skinless chicken or turkey. Ground chicken or turkey. Pork trimmed of fat. All fish and seafood. Eggs. Dried beans, peas, or lentils. Unsalted nuts and seeds. Unsalted canned beans. °Dairy °Low-fat dairy products, such as skim or 1% milk, 2% or reduced-fat cheeses, low-fat ricotta or cottage cheese, or plain low-fat yogurt. Low-sodium or reduced-sodium cheeses. °Fats and Oils °Tub margarines without trans fats. Light or reduced-fat mayonnaise and salad dressings (reduced sodium). Avocado. Safflower, olive, or canola oils. Natural peanut or almond butter. °Other °Unsalted popcorn and pretzels. °The items listed above may not be a complete list of recommended foods or beverages. Contact your dietitian for more options. °WHAT FOODS ARE NOT RECOMMENDED? °Grains °White bread. White pasta. White rice. Refined cornbread. Bagels and croissants. Crackers that contain trans fat. °Vegetables °Creamed or fried vegetables. Vegetables in a cheese sauce. Regular canned vegetables. Regular canned tomato sauce and paste. Regular tomato and vegetable juices. °Fruits °Dried fruits. Canned fruit in light or heavy syrup. Fruit juice. °Meat and Other Protein Products °Fatty cuts of meat. Ribs, chicken wings, bacon, sausage, bologna, salami, chitterlings, fatback, hot dogs, bratwurst, and packaged luncheon meats. Salted nuts and seeds. Canned beans with salt. °Dairy °Whole or 2% milk, cream, half-and-half, and cream cheese. Whole-fat or sweetened yogurt. Full-fat   cheeses or blue cheese. Nondairy creamers and whipped toppings. Processed cheese, cheese spreads, or cheese  curds. °Condiments °Onion and garlic salt, seasoned salt, table salt, and sea salt. Canned and packaged gravies. Worcestershire sauce. Tartar sauce. Barbecue sauce. Teriyaki sauce. Soy sauce, including reduced sodium. Steak sauce. Fish sauce. Oyster sauce. Cocktail sauce. Horseradish. Ketchup and mustard. Meat flavorings and tenderizers. Bouillon cubes. Hot sauce. Tabasco sauce. Marinades. Taco seasonings. Relishes. °Fats and Oils °Butter, stick margarine, lard, shortening, ghee, and bacon fat. Coconut, palm kernel, or palm oils. Regular salad dressings. °Other °Pickles and olives. Salted popcorn and pretzels. °The items listed above may not be a complete list of foods and beverages to avoid. Contact your dietitian for more information. °WHERE CAN I FIND MORE INFORMATION? °[employer], [employer], [employer]: www.nhlbi.nih.gov/health/health-topics/topics/dash/ °Document Released: 08/15/2011 Document Revised: 01/10/2014 Document Reviewed: 06/30/2013 °Exit[employer]® Patient Information ©2015 [employer]. This information is not intended to replace advice given to you by your health care provider. Make sure you discuss any questions you have with your health care provider. ° °

## 2014-04-22 NOTE — Progress Notes (Signed)
Patient here to follow up on her hypertension and diabetes.  Her blood pressure is elevated this morning she thinks due to a near miss on the drive here.  She is also having trouble with yeast infections.  She has tried OTC but its not helping.

## 2014-04-22 NOTE — Progress Notes (Signed)
MRN: [id number] Name: Tina Gomez  Sex: female Age: 58 y.o. DOB: [birth year]  Allergies: Codeine  Chief Complaint  Patient presents with  . Diabetes  . Hypertension  . Vaginitis    HPI: Patient is 58 y.o. female who has history of diabetes, hypertension, hypokalemia comes today for followup, her blood pressure is elevated she denies any headache dizziness or shortness of breath as per patient she is taking lisinopril hydrochlorothiazide and Norvasc, her diabetes is well controlled A1c is improved her, she denies any hypoglycemic symptoms. Her patient denies smoking cigarettes. Patient reported to have vaginal itching and thinks she has yeast infection, and is requesting prescription for Diflucan which helps him with the symptoms. Patient also reported to have lot of reflux symptoms as per patient Nexium Is not helping with the symptoms she wants to try different medication.  Past Medical History  Diagnosis Date  . Hypertension   . Stroke     Past Surgical History  Procedure Laterality Date  . Cesarean section        Medication List       This list is accurate as of: 04/22/14  9:37 AM.  Always use your most recent med list.               amLODipine 5 MG tablet  Commonly known as:  NORVASC  Take 1 tablet (5 mg total) by mouth daily.     aspirin 325 MG tablet  Take 1 tablet (325 mg total) by mouth daily.     atorvastatin 20 MG tablet  Commonly known as:  LIPITOR  Take 1 tablet (20 mg total) by mouth daily at 6 PM.     blood glucose meter kit and supplies  Use as instructed     cyclobenzaprine 5 MG tablet  Commonly known as:  FLEXERIL  Take 1 tablet (5 mg total) by mouth 3 (three) times daily as needed for muscle spasms.     fluconazole 150 MG tablet  Commonly known as:  DIFLUCAN  Take 1 tablet (150 mg total) by mouth once.     freestyle lancets  Use as instructed     glucose blood test strip  Commonly known as:  TRUETEST TEST  Use as instructed     hydrochlorothiazide 25 MG tablet  Commonly known as:  HYDRODIURIL  Take 1 tablet (25 mg total) by mouth daily.     lisinopril 40 MG tablet  Commonly known as:  PRINIVIL,ZESTRIL  Take 1 tablet (40 mg total) by mouth daily.     metFORMIN 500 MG tablet  Commonly known as:  GLUCOPHAGE  Take 1 tablet (500 mg total) by mouth 2 (two) times daily with a meal.     pantoprazole 40 MG tablet  Commonly known as:  PROTONIX  Take 1 tablet (40 mg total) by mouth daily.        Meds ordered this encounter  Medications  . fluconazole (DIFLUCAN) 150 MG tablet    Sig: Take 1 tablet (150 mg total) by mouth once.    Dispense:  1 tablet    Refill:  0  . pantoprazole (PROTONIX) 40 MG tablet    Sig: Take 1 tablet (40 mg total) by mouth daily.    Dispense:  30 tablet    Refill:  3  . DISCONTD: amLODipine (NORVASC) 5 MG tablet    Sig: Take 0.5 tablets (2.5 mg total) by mouth daily.    Dispense:  90 tablet    Refill:  3  . amLODipine (NORVASC) 5 MG tablet    Sig: Take 1 tablet (5 mg total) by mouth daily.    Dispense:  90 tablet    Refill:  3    Immunization History  Administered Date(s) Administered  . Pneumococcal Polysaccharide-23 10/23/2013    Family History  Problem Relation Age of Onset  . Stroke Mother   . Hypertension Mother   . Hypertension Father   . Hyperlipidemia Mother   . Hyperlipidemia Father     History  Substance Use Topics  . Smoking status: Current Every Day Smoker -- 1.00 packs/day for 10 years    Types: Cigarettes  . Smokeless tobacco: Not on file  . Alcohol Use: No    Review of Systems   As noted in HPI  Filed Vitals:   04/22/14 0917  BP: 171/84  Pulse:   Temp:   Resp:     Physical Exam  Physical Exam  Constitutional: No distress.  Eyes: EOM are normal. Pupils are equal, round, and reactive to light.  Cardiovascular: Normal rate and regular rhythm.   Pulmonary/Chest: Breath sounds normal. No respiratory distress. She has no wheezes. She has no  rales.    CBC    Component Value Date/Time   WBC 8.8 10/30/2013 1202   RBC 4.47 10/30/2013 1202   HGB 13.1 10/30/2013 1202   HCT 38.4 10/30/2013 1202   PLT 250 10/30/2013 1202   MCV 85.9 10/30/2013 1202   LYMPHSABS 1.8 10/30/2013 1202   MONOABS 0.8 10/30/2013 1202   EOSABS 0.1 10/30/2013 1202   BASOSABS 0.0 10/30/2013 1202    CMP     Component Value Date/Time   NA 140 10/30/2013 1202   K 4.1 10/30/2013 1202   CL 102 10/30/2013 1202   CO2 25 10/30/2013 1202   GLUCOSE 190* 10/30/2013 1202   BUN 28* 10/30/2013 1202   CREATININE 0.77 10/30/2013 1202   CALCIUM 9.3 10/30/2013 1202   PROT 7.8 10/22/2013 1554   ALBUMIN 3.6 10/22/2013 1554   AST 14 10/22/2013 1554   ALT 13 10/22/2013 1554   ALKPHOS 133* 10/22/2013 1554   BILITOT 0.3 10/22/2013 1554   GFRNONAA >90 10/30/2013 1202   GFRAA >90 10/30/2013 1202    Lab Results  Component Value Date/Time   CHOL 204* 10/23/2013  2:50 AM    No components found with this basename: hga1c    Lab Results  Component Value Date/Time   AST 14 10/22/2013  3:54 PM    Assessment and Plan  Type II or unspecified type diabetes mellitus without mention of complication, not stated as uncontrolled - Plan:  Results for orders placed in visit on 04/22/14  POCT GLYCOSYLATED HEMOGLOBIN (HGB A1C)      Result Value Ref Range   Hemoglobin A1C 5.7    GLUCOSE, POCT (MANUAL RESULT ENTRY)      Result Value Ref Range   POC Glucose 180 (*) 70 - 99 mg/dl   Diabetes is well controlled A1c has trended down, currently she is on metformin, I have advised patient to keep the fingerstick log, for blood sugar drops frequently less than 80 she will reduce the dose of metformin.  Essential hypertension, benign - Plan: Blood pressure is uncontrolled, she will continue with lisinopril as well as hydrochlorothiazide, I have increased the dose of amlodipine 5 mg daily and advise patient for DASH diet her  Will repeat fasting COMPLETE METABOLIC PANEL WITH GFR, amLODipine (NORVASC) 5 MG  tablet,   Other and unspecified  hyperlipidemia - Plan: Patient is currently on Lipitor, will repeat fasting Lipid panel  Gastroesophageal reflux disease, esophagitis presence not specified - Plan: Advised for diet modification I have prescribed pantoprazole (PROTONIX) 40 MG tablet  Vaginal itching - Plan: fluconazole (DIFLUCAN) 150 MG tablet   Health Maintenance -Colonoscopy: already referred to GI : -Mammogram: already ordered    Return in about 3 months (around 07/23/2014) for hypertension, diabetes.  Lorayne Marek, MD

## 2014-04-27 ENCOUNTER — Encounter: Payer: Self-pay | Admitting: Nurse Practitioner

## 2014-08-09 ENCOUNTER — Other Ambulatory Visit: Payer: Self-pay | Admitting: Internal Medicine

## 2014-08-11 ENCOUNTER — Telehealth: Payer: Self-pay | Admitting: Internal Medicine

## 2014-08-11 NOTE — Telephone Encounter (Signed)
Pt. Came into facility to request a refill on all of her blood pressure medications, and diabetes medication. Pt. Would like enough medication to last her until her next appointment. Please f/u with pt.

## 2014-08-22 ENCOUNTER — Ambulatory Visit (HOSPITAL_BASED_OUTPATIENT_CLINIC_OR_DEPARTMENT_OTHER): Payer: Self-pay

## 2014-08-22 ENCOUNTER — Ambulatory Visit: Payer: Self-pay | Attending: Internal Medicine | Admitting: Internal Medicine

## 2014-08-22 ENCOUNTER — Encounter: Payer: Self-pay | Admitting: Internal Medicine

## 2014-08-22 VITALS — BP 170/80 | HR 84 | Temp 98.0°F | Resp 17

## 2014-08-22 DIAGNOSIS — Z23 Encounter for immunization: Secondary | ICD-10-CM

## 2014-08-22 DIAGNOSIS — Z8673 Personal history of transient ischemic attack (TIA), and cerebral infarction without residual deficits: Secondary | ICD-10-CM

## 2014-08-22 DIAGNOSIS — I10 Essential (primary) hypertension: Secondary | ICD-10-CM

## 2014-08-22 DIAGNOSIS — E139 Other specified diabetes mellitus without complications: Secondary | ICD-10-CM

## 2014-08-22 DIAGNOSIS — F17210 Nicotine dependence, cigarettes, uncomplicated: Secondary | ICD-10-CM | POA: Insufficient documentation

## 2014-08-22 DIAGNOSIS — K219 Gastro-esophageal reflux disease without esophagitis: Secondary | ICD-10-CM

## 2014-08-22 DIAGNOSIS — Z7982 Long term (current) use of aspirin: Secondary | ICD-10-CM | POA: Insufficient documentation

## 2014-08-22 DIAGNOSIS — Z79899 Other long term (current) drug therapy: Secondary | ICD-10-CM | POA: Insufficient documentation

## 2014-08-22 DIAGNOSIS — E785 Hyperlipidemia, unspecified: Secondary | ICD-10-CM

## 2014-08-22 LAB — GLUCOSE, POCT (MANUAL RESULT ENTRY): POC Glucose: 190 mg/dl — AB (ref 70–99)

## 2014-08-22 LAB — POCT GLYCOSYLATED HEMOGLOBIN (HGB A1C): HEMOGLOBIN A1C: 7.3

## 2014-08-22 MED ORDER — METFORMIN HCL 500 MG PO TABS: 500.0000 mg | ORAL_TABLET | Freq: Two times a day (BID) | ORAL | Status: DC

## 2014-08-22 MED ORDER — HYDROCHLOROTHIAZIDE 25 MG PO TABS: 25.0000 mg | ORAL_TABLET | Freq: Every day | ORAL | Status: DC

## 2014-08-22 MED ORDER — CLONIDINE HCL 0.1 MG PO TABS
0.1000 mg | ORAL_TABLET | Freq: Once | ORAL | Status: AC
  Administered 2014-08-22: 0.1 mg via ORAL

## 2014-08-22 MED ORDER — PANTOPRAZOLE SODIUM 40 MG PO TBEC: 40.0000 mg | DELAYED_RELEASE_TABLET | Freq: Every day | ORAL | Status: DC

## 2014-08-22 MED ORDER — CYCLOBENZAPRINE HCL 5 MG PO TABS: 5.0000 mg | ORAL_TABLET | Freq: Three times a day (TID) | ORAL | Status: DC | PRN

## 2014-08-22 MED ORDER — LISINOPRIL 40 MG PO TABS: 40.0000 mg | ORAL_TABLET | Freq: Every day | ORAL | Status: DC

## 2014-08-22 MED ORDER — ATORVASTATIN CALCIUM 20 MG PO TABS: 20.0000 mg | ORAL_TABLET | Freq: Every day | ORAL | Status: DC

## 2014-08-22 MED ORDER — AMLODIPINE BESYLATE 10 MG PO TABS: 10.0000 mg | ORAL_TABLET | Freq: Every day | ORAL | Status: DC

## 2014-08-22 NOTE — Progress Notes (Signed)
MRN: [id number] Name: Tina Gomez  Sex: female Age: 58 y.o. DOB: 1955/10/20  Allergies: Codeine  Chief Complaint  Patient presents with  . Follow-up    HPI: Patient is 58 y.o. female who has history of diabetes, hypertension,GERD, hyperlipidemia, stroke comes today for followup today her blood pressure is elevated her as per patient she is compliant in taking her medications but has not been compliant with low salt low carbohydrate diet, currently denies any headache dizziness chest and shortness of breath, today her hemoglobin A1c noticed to have trended up compared to last visit. Currently patient is taking metformin 500 mg 2 times a day for hypertension she has been on Norvasc 5 mg hydrochlorothiazide 25 mg, lisinopril 40 mg daily,patient is requesting refill on her medications. Patient is up-to-date with a pneumonia shot and would like to get a flu shot today.  Past Medical History  Diagnosis Date  . Hypertension   . Stroke     Past Surgical History  Procedure Laterality Date  . Cesarean section        Medication List       This list is accurate as of: 08/22/14 12:37 PM.  Always use your most recent med list.               amLODipine 10 MG tablet  Commonly known as:  NORVASC  Take 1 tablet (10 mg total) by mouth daily.     aspirin 325 MG tablet  Take 1 tablet (325 mg total) by mouth daily.     atorvastatin 20 MG tablet  Commonly known as:  LIPITOR  Take 1 tablet (20 mg total) by mouth daily at 6 PM.     blood glucose meter kit and supplies  Use as instructed     cyclobenzaprine 5 MG tablet  Commonly known as:  FLEXERIL  Take 1 tablet (5 mg total) by mouth 3 (three) times daily as needed for muscle spasms.     fluconazole 150 MG tablet  Commonly known as:  DIFLUCAN  Take 1 tablet (150 mg total) by mouth once.     freestyle lancets  Use as instructed     glucose blood test strip  Commonly known as:  TRUETEST TEST  Use as instructed     hydrochlorothiazide 25 MG tablet  Commonly known as:  HYDRODIURIL  Take 1 tablet (25 mg total) by mouth daily.     lisinopril 40 MG tablet  Commonly known as:  PRINIVIL,ZESTRIL  Take 1 tablet (40 mg total) by mouth daily.     metFORMIN 500 MG tablet  Commonly known as:  GLUCOPHAGE  Take 1 tablet (500 mg total) by mouth 2 (two) times daily with a meal.     pantoprazole 40 MG tablet  Commonly known as:  PROTONIX  Take 1 tablet (40 mg total) by mouth daily.        Meds ordered this encounter  Medications  . amLODipine (NORVASC) 10 MG tablet    Sig: Take 1 tablet (10 mg total) by mouth daily.    Dispense:  30 tablet    Refill:  3  . atorvastatin (LIPITOR) 20 MG tablet    Sig: Take 1 tablet (20 mg total) by mouth daily at 6 PM.    Dispense:  30 tablet    Refill:  7  . cyclobenzaprine (FLEXERIL) 5 MG tablet    Sig: Take 1 tablet (5 mg total) by mouth 3 (three) times daily as needed for muscle  spasms.    Dispense:  90 tablet    Refill:  5  . hydrochlorothiazide (HYDRODIURIL) 25 MG tablet    Sig: Take 1 tablet (25 mg total) by mouth daily.    Dispense:  90 tablet    Refill:  7  . lisinopril (PRINIVIL,ZESTRIL) 40 MG tablet    Sig: Take 1 tablet (40 mg total) by mouth daily.    Dispense:  30 tablet    Refill:  7  . metFORMIN (GLUCOPHAGE) 500 MG tablet    Sig: Take 1 tablet (500 mg total) by mouth 2 (two) times daily with a meal.    Dispense:  60 tablet    Refill:  7  . pantoprazole (PROTONIX) 40 MG tablet    Sig: Take 1 tablet (40 mg total) by mouth daily.    Dispense:  30 tablet    Refill:  3  . cloNIDine (CATAPRES) tablet 0.1 mg    Sig:     Immunization History  Administered Date(s) Administered  . Influenza,inj,Quad PF,36+ Mos 08/22/2014  . Pneumococcal Polysaccharide-23 10/23/2013    Family History  Problem Relation Age of Onset  . Stroke Mother   . Hypertension Mother   . Hypertension Father   . Hyperlipidemia Mother   . Hyperlipidemia Father     History   Substance Use Topics  . Smoking status: Current Every Day Smoker -- 1.00 packs/day for 10 years    Types: Cigarettes  . Smokeless tobacco: Not on file  . Alcohol Use: No    Review of Systems   As noted in HPI  Filed Vitals:   08/22/14 1227  BP: 170/80  Pulse:   Temp:   Resp:     Physical Exam  Physical Exam  Constitutional: No distress.  Eyes: EOM are normal. Pupils are equal, round, and reactive to light.  Cardiovascular: Normal rate and regular rhythm.   Pulmonary/Chest: Breath sounds normal. No respiratory distress. She has no wheezes. She has no rales.  Abdominal: Soft. There is no tenderness.  Musculoskeletal: She exhibits no edema.    CBC    Component Value Date/Time   WBC 8.8 10/30/2013 1202   RBC 4.47 10/30/2013 1202   HGB 13.1 10/30/2013 1202   HCT 38.4 10/30/2013 1202   PLT 250 10/30/2013 1202   MCV 85.9 10/30/2013 1202   LYMPHSABS 1.8 10/30/2013 1202   MONOABS 0.8 10/30/2013 1202   EOSABS 0.1 10/30/2013 1202   BASOSABS 0.0 10/30/2013 1202    CMP     Component Value Date/Time   NA 140 10/30/2013 1202   K 4.1 10/30/2013 1202   CL 102 10/30/2013 1202   CO2 25 10/30/2013 1202   GLUCOSE 190* 10/30/2013 1202   BUN 28* 10/30/2013 1202   CREATININE 0.77 10/30/2013 1202   CALCIUM 9.3 10/30/2013 1202   PROT 7.8 10/22/2013 1554   ALBUMIN 3.6 10/22/2013 1554   AST 14 10/22/2013 1554   ALT 13 10/22/2013 1554   ALKPHOS 133* 10/22/2013 1554   BILITOT 0.3 10/22/2013 1554   GFRNONAA >90 10/30/2013 1202   GFRAA >90 10/30/2013 1202    Lab Results  Component Value Date/Time   CHOL 204* 10/23/2013 02:50 AM    No components found for: HGA1C  Lab Results  Component Value Date/Time   AST 14 10/22/2013 03:54 PM    Assessment and Plan  Other specified diabetes mellitus without complications - Plan: Results for orders placed or performed in visit on 08/22/14  Glucose (CBG)  Result Value Ref  Range   POC Glucose 190 (A) 70 - 99 mg/dl  HgB A1c    Result Value Ref Range   Hemoglobin A1C 7.3    , HgB A1c Has trended up, advise patient for diabetes meal planning, continue with her metFORMIN (GLUCOPHAGE) 500 MG tablet, COMPLETE METABOLIC PANEL WITH GFR, will repeat A1c in 3 months  Essential hypertension, benign - Plan: blood pressure is still uncontrolled, have increased the dose of amlodipine to 10 mg, continue with hydrochlorothiazide, lisinopril , she was given clonidine and her repeat manual blood pressure is is improved, she'll come back in 2 weeks for BP check nurses Visit amLODipine (NORVASC) 10 MG tablet, hydrochlorothiazide (HYDRODIURIL) 25 MG tablet, lisinopril (PRINIVIL,ZESTRIL) 40 MG tablet, cloNIDine (CATAPRES) tablet 0.1 mg, COMPLETE METABOLIC PANEL WITH GFR  Gastroesophageal reflux disease, esophagitis presence not specified - Plan:Lifestyle modification, continue with pantoprazole (PROTONIX) 40 MG tablet  History of stroke Currently on aspirin, statins.  Needs flu shot Flu shot given today.  Hyperlipidemia - Plan: Currently on atorvastatin (LIPITOR) 20 MG tablet, we'll check fasting Lipid panel   Health Maintenance  -Vaccinations: Up-to-date with Pneumovax  Flu shot today   Return in about 3 months (around 11/21/2014) for diabetes, hypertension, hyperipidemia, BP check and fasting blood work in 2 weeks/Nurse Visit.  Lorayne Marek, MD

## 2014-08-22 NOTE — Patient Instructions (Signed)
Diabetes Mellitus and Food It is important for you to manage your blood sugar (glucose) level. Your blood glucose level can be greatly affected by what you eat. Eating healthier foods in the appropriate amounts throughout the day at about the same time each day will help you control your blood glucose level. It can also help slow or prevent worsening of your diabetes mellitus. Healthy eating may even help you improve the level of your blood pressure and reach or maintain a healthy weight.  HOW CAN FOOD AFFECT ME? Carbohydrates Carbohydrates affect your blood glucose level more than any other type of food. Your dietitian will help you determine how many carbohydrates to eat at each meal and teach you how to count carbohydrates. Counting carbohydrates is important to keep your blood glucose at a healthy level, especially if you are using insulin or taking certain medicines for diabetes mellitus. Alcohol Alcohol can cause sudden decreases in blood glucose (hypoglycemia), especially if you use insulin or take certain medicines for diabetes mellitus. Hypoglycemia can be a life-threatening condition. Symptoms of hypoglycemia (sleepiness, dizziness, and disorientation) are similar to symptoms of having too much alcohol.  If your health care provider has given you approval to drink alcohol, do so in moderation and use the following guidelines:  Women should not have more than one drink per day, and men should not have more than two drinks per day. One drink is equal to:  12 oz of beer.  5 oz of wine.  1 oz of hard liquor.  Do not drink on an empty stomach.  Keep yourself hydrated. Have water, diet soda, or unsweetened iced tea.  Regular soda, juice, and other mixers might contain a lot of carbohydrates and should be counted. WHAT FOODS ARE NOT RECOMMENDED? As you make food choices, it is important to remember that all foods are not the same. Some foods have fewer nutrients per serving than other  foods, even though they might have the same number of calories or carbohydrates. It is difficult to get your body what it needs when you eat foods with fewer nutrients. Examples of foods that you should avoid that are high in calories and carbohydrates but low in nutrients include:  Trans fats (most processed foods list trans fats on the Nutrition Facts label).  Regular soda.  Juice.  Candy.  Sweets, such as cake, pie, doughnuts, and cookies.  Fried foods. WHAT FOODS CAN I EAT? Have nutrient-rich foods, which will nourish your body and keep you healthy. The food you should eat also will depend on several factors, including:  The calories you need.  The medicines you take.  Your weight.  Your blood glucose level.  Your blood pressure level.  Your cholesterol level. You also should eat a variety of foods, including:  Protein, such as meat, poultry, fish, tofu, nuts, and seeds (lean animal proteins are best).  Fruits.  Vegetables.  Dairy products, such as milk, cheese, and yogurt (low fat is best).  Breads, grains, pasta, cereal, rice, and beans.  Fats such as olive oil, trans fat-free margarine, canola oil, avocado, and olives. DOES EVERYONE WITH DIABETES MELLITUS HAVE THE SAME MEAL PLAN? Because every person with diabetes mellitus is different, there is not one meal plan that works for everyone. It is very important that you meet with a dietitian who will help you create a meal plan that is just right for you. Document Released: 05/23/2005 Document Revised: 08/31/2013 Document Reviewed: 07/23/2013 Exit[employer] Patient Information 2015 [employer]. This   information is not intended to replace advice given to you by your health care provider. Make sure you discuss any questions you have with your health care provider. DASH Eating Plan DASH stands for "Dietary Approaches to Stop Hypertension." The DASH eating plan is a healthy eating plan that has been shown to reduce high  blood pressure (hypertension). Additional health benefits may include reducing the risk of type 2 diabetes mellitus, heart disease, and stroke. The DASH eating plan may also help with weight loss. WHAT DO I NEED TO KNOW ABOUT THE DASH EATING PLAN? For the DASH eating plan, you will follow these general guidelines:  Choose foods with a percent daily value for sodium of less than 5% (as listed on the food label).  Use salt-free seasonings or herbs instead of table salt or sea salt.  Check with your health care provider or pharmacist before using salt substitutes.  Eat lower-sodium products, often labeled as "lower sodium" or "no salt added."  Eat fresh foods.  Eat more vegetables, fruits, and low-fat dairy products.  Choose whole grains. Look for the word "whole" as the first word in the ingredient list.  Choose fish and skinless chicken or turkey more often than red meat. Limit fish, poultry, and meat to 6 oz (170 g) each day.  Limit sweets, desserts, sugars, and sugary drinks.  Choose heart-healthy fats.  Limit cheese to 1 oz (28 g) per day.  Eat more home-cooked food and less restaurant, buffet, and fast food.  Limit fried foods.  Cook foods using methods other than frying.  Limit canned vegetables. If you do use them, rinse them well to decrease the sodium.  When eating at a restaurant, ask that your food be prepared with less salt, or no salt if possible. WHAT FOODS CAN I EAT? Seek help from a dietitian for individual calorie needs. Grains Whole grain or whole wheat bread. Brown rice. Whole grain or whole wheat pasta. Quinoa, bulgur, and whole grain cereals. Low-sodium cereals. Corn or whole wheat flour tortillas. Whole grain cornbread. Whole grain crackers. Low-sodium crackers. Vegetables Fresh or frozen vegetables (raw, steamed, roasted, or grilled). Low-sodium or reduced-sodium tomato and vegetable juices. Low-sodium or reduced-sodium tomato sauce and paste. Low-sodium  or reduced-sodium canned vegetables.  Fruits All fresh, canned (in natural juice), or frozen fruits. Meat and Other Protein Products Ground beef (85% or leaner), grass-fed beef, or beef trimmed of fat. Skinless chicken or turkey. Ground chicken or turkey. Pork trimmed of fat. All fish and seafood. Eggs. Dried beans, peas, or lentils. Unsalted nuts and seeds. Unsalted canned beans. Dairy Low-fat dairy products, such as skim or 1% milk, 2% or reduced-fat cheeses, low-fat ricotta or cottage cheese, or plain low-fat yogurt. Low-sodium or reduced-sodium cheeses. Fats and Oils Tub margarines without trans fats. Light or reduced-fat mayonnaise and salad dressings (reduced sodium). Avocado. Safflower, olive, or canola oils. Natural peanut or almond butter. Other Unsalted popcorn and pretzels. The items listed above may not be a complete list of recommended foods or beverages. Contact your dietitian for more options. WHAT FOODS ARE NOT RECOMMENDED? Grains White bread. White pasta. White rice. Refined cornbread. Bagels and croissants. Crackers that contain trans fat. Vegetables Creamed or fried vegetables. Vegetables in a cheese sauce. Regular canned vegetables. Regular canned tomato sauce and paste. Regular tomato and vegetable juices. Fruits Dried fruits. Canned fruit in light or heavy syrup. Fruit juice. Meat and Other Protein Products Fatty cuts of meat. Ribs, chicken wings, bacon, sausage, bologna, salami, chitterlings, fatback, hot   dogs, bratwurst, and packaged luncheon meats. Salted nuts and seeds. Canned beans with salt. Dairy Whole or 2% milk, cream, half-and-half, and cream cheese. Whole-fat or sweetened yogurt. Full-fat cheeses or blue cheese. Nondairy creamers and whipped toppings. Processed cheese, cheese spreads, or cheese curds. Condiments Onion and garlic salt, seasoned salt, table salt, and sea salt. Canned and packaged gravies. Worcestershire sauce. Tartar sauce. Barbecue sauce.  Teriyaki sauce. Soy sauce, including reduced sodium. Steak sauce. Fish sauce. Oyster sauce. Cocktail sauce. Horseradish. Ketchup and mustard. Meat flavorings and tenderizers. Bouillon cubes. Hot sauce. Tabasco sauce. Marinades. Taco seasonings. Relishes. Fats and Oils Butter, stick margarine, lard, shortening, ghee, and bacon fat. Coconut, palm kernel, or palm oils. Regular salad dressings. Other Pickles and olives. Salted popcorn and pretzels. The items listed above may not be a complete list of foods and beverages to avoid. Contact your dietitian for more information. WHERE CAN I FIND MORE INFORMATION? National [employer], [employer], [employer]: www.nhlbi.nih.gov/health/health-topics/topics/dash/ Document Released: 08/15/2011 Document Revised: 01/10/2014 Document Reviewed: 06/30/2013 Exit[employer] Patient Information 2015 [employer]. This information is not intended to replace advice given to you by your health care provider. Make sure you discuss any questions you have with your health care provider.  

## 2014-08-22 NOTE — Progress Notes (Signed)
Patient here for follow oh her diabetes and medication refills

## 2014-09-06 ENCOUNTER — Ambulatory Visit: Payer: Self-pay | Attending: Internal Medicine

## 2014-09-06 DIAGNOSIS — E139 Other specified diabetes mellitus without complications: Secondary | ICD-10-CM

## 2014-09-06 DIAGNOSIS — I10 Essential (primary) hypertension: Secondary | ICD-10-CM

## 2014-09-06 DIAGNOSIS — E785 Hyperlipidemia, unspecified: Secondary | ICD-10-CM

## 2014-09-06 LAB — COMPLETE METABOLIC PANEL WITH GFR
ALT: 8 U/L (ref 0–35)
AST: 10 U/L (ref 0–37)
Albumin: 4 g/dL (ref 3.5–5.2)
Alkaline Phosphatase: 92 U/L (ref 39–117)
BUN: 38 mg/dL — AB (ref 6–23)
CHLORIDE: 104 meq/L (ref 96–112)
CO2: 26 meq/L (ref 19–32)
CREATININE: 1.33 mg/dL — AB (ref 0.50–1.10)
Calcium: 9.2 mg/dL (ref 8.4–10.5)
GFR, EST AFRICAN AMERICAN: 51 mL/min — AB
GFR, Est Non African American: 44 mL/min — ABNORMAL LOW
GLUCOSE: 148 mg/dL — AB (ref 70–99)
POTASSIUM: 4.9 meq/L (ref 3.5–5.3)
Sodium: 138 mEq/L (ref 135–145)
Total Bilirubin: 0.3 mg/dL (ref 0.2–1.2)
Total Protein: 7 g/dL (ref 6.0–8.3)

## 2014-09-06 LAB — LIPID PANEL
Cholesterol: 172 mg/dL (ref 0–200)
HDL: 37 mg/dL — AB (ref >39)
LDL CALC: 103 mg/dL — AB (ref 0–99)
Total CHOL/HDL Ratio: 4.6 Ratio
Triglycerides: 158 mg/dL — ABNORMAL HIGH (ref <150)
VLDL: 32 mg/dL (ref 0–40)

## 2014-09-13 ENCOUNTER — Telehealth: Payer: Self-pay | Admitting: *Deleted

## 2014-09-13 NOTE — Telephone Encounter (Signed)
-----   Message from Lorayne Marek, MD sent at 09/07/2014 12:59 PM EST ----- Blood work reviewed call and let the patient know that her triglycerides are improved, continue with low-fat diet. Noticed increase in BUN and creatinine has some worsening renal function, advise patient for more diabetes diet control since her A1c has trended up

## 2014-09-13 NOTE — Telephone Encounter (Signed)
Pt aware of lab results, advised to maintain a low carbohydrate, low fat diet and exercise

## 2014-10-10 ENCOUNTER — Other Ambulatory Visit: Payer: Self-pay | Admitting: Internal Medicine

## 2014-10-14 ENCOUNTER — Telehealth: Payer: Self-pay | Admitting: Emergency Medicine

## 2014-10-14 NOTE — Telephone Encounter (Signed)
Medication issues resolved

## 2015-01-09 ENCOUNTER — Other Ambulatory Visit: Payer: Self-pay | Admitting: Internal Medicine

## 2015-02-10 ENCOUNTER — Other Ambulatory Visit: Payer: Self-pay | Admitting: Internal Medicine

## 2015-02-14 ENCOUNTER — Encounter: Payer: Self-pay | Admitting: Internal Medicine

## 2015-02-14 ENCOUNTER — Ambulatory Visit: Payer: Self-pay | Attending: Internal Medicine | Admitting: Internal Medicine

## 2015-02-14 VITALS — BP 150/70 | HR 71 | Temp 98.0°F | Resp 16 | Wt 207.0 lb

## 2015-02-14 DIAGNOSIS — Z720 Tobacco use: Secondary | ICD-10-CM | POA: Insufficient documentation

## 2015-02-14 DIAGNOSIS — E139 Other specified diabetes mellitus without complications: Secondary | ICD-10-CM | POA: Insufficient documentation

## 2015-02-14 DIAGNOSIS — E785 Hyperlipidemia, unspecified: Secondary | ICD-10-CM | POA: Insufficient documentation

## 2015-02-14 DIAGNOSIS — Z8673 Personal history of transient ischemic attack (TIA), and cerebral infarction without residual deficits: Secondary | ICD-10-CM | POA: Insufficient documentation

## 2015-02-14 DIAGNOSIS — I10 Essential (primary) hypertension: Secondary | ICD-10-CM | POA: Insufficient documentation

## 2015-02-14 DIAGNOSIS — K219 Gastro-esophageal reflux disease without esophagitis: Secondary | ICD-10-CM | POA: Insufficient documentation

## 2015-02-14 LAB — POCT GLYCOSYLATED HEMOGLOBIN (HGB A1C): HEMOGLOBIN A1C: 7.6

## 2015-02-14 LAB — GLUCOSE, POCT (MANUAL RESULT ENTRY): POC Glucose: 199 mg/dl — AB (ref 70–99)

## 2015-02-14 MED ORDER — HYDROCHLOROTHIAZIDE 25 MG PO TABS: 25.0000 mg | ORAL_TABLET | Freq: Every day | ORAL | Status: DC

## 2015-02-14 MED ORDER — AMLODIPINE BESYLATE 10 MG PO TABS: 10.0000 mg | ORAL_TABLET | Freq: Every day | ORAL | Status: DC

## 2015-02-14 MED ORDER — ATORVASTATIN CALCIUM 20 MG PO TABS: 20.0000 mg | ORAL_TABLET | Freq: Every day | ORAL | Status: DC

## 2015-02-14 MED ORDER — LISINOPRIL 40 MG PO TABS: 40.0000 mg | ORAL_TABLET | Freq: Every day | ORAL | Status: DC

## 2015-02-14 MED ORDER — PANTOPRAZOLE SODIUM 40 MG PO TBEC: 40.0000 mg | DELAYED_RELEASE_TABLET | Freq: Every day | ORAL | Status: DC

## 2015-02-14 MED ORDER — METFORMIN HCL 500 MG PO TABS: 500.0000 mg | ORAL_TABLET | Freq: Two times a day (BID) | ORAL | Status: DC

## 2015-02-14 NOTE — Patient Instructions (Signed)
Diabetes Mellitus and Food It is important for you to manage your blood sugar (glucose) level. Your blood glucose level can be greatly affected by what you eat. Eating healthier foods in the appropriate amounts throughout the day at about the same time each day will help you control your blood glucose level. It can also help slow or prevent worsening of your diabetes mellitus. Healthy eating may even help you improve the level of your blood pressure and reach or maintain a healthy weight.  HOW CAN FOOD AFFECT ME? Carbohydrates Carbohydrates affect your blood glucose level more than any other type of food. Your dietitian will help you determine how many carbohydrates to eat at each meal and teach you how to count carbohydrates. Counting carbohydrates is important to keep your blood glucose at a healthy level, especially if you are using insulin or taking certain medicines for diabetes mellitus. Alcohol Alcohol can cause sudden decreases in blood glucose (hypoglycemia), especially if you use insulin or take certain medicines for diabetes mellitus. Hypoglycemia can be a life-threatening condition. Symptoms of hypoglycemia (sleepiness, dizziness, and disorientation) are similar to symptoms of having too much alcohol.  If your health care provider has given you approval to drink alcohol, do so in moderation and use the following guidelines:  Women should not have more than one drink per day, and men should not have more than two drinks per day. One drink is equal to:  12 oz of beer.  5 oz of wine.  1 oz of hard liquor.  Do not drink on an empty stomach.  Keep yourself hydrated. Have water, diet soda, or unsweetened iced tea.  Regular soda, juice, and other mixers might contain a lot of carbohydrates and should be counted. WHAT FOODS ARE NOT RECOMMENDED? As you make food choices, it is important to remember that all foods are not the same. Some foods have fewer nutrients per serving than other  foods, even though they might have the same number of calories or carbohydrates. It is difficult to get your body what it needs when you eat foods with fewer nutrients. Examples of foods that you should avoid that are high in calories and carbohydrates but low in nutrients include:  Trans fats (most processed foods list trans fats on the Nutrition Facts label).  Regular soda.  Juice.  Candy.  Sweets, such as cake, pie, doughnuts, and cookies.  Fried foods. WHAT FOODS CAN I EAT? Have nutrient-rich foods, which will nourish your body and keep you healthy. The food you should eat also will depend on several factors, including:  The calories you need.  The medicines you take.  Your weight.  Your blood glucose level.  Your blood pressure level.  Your cholesterol level. You also should eat a variety of foods, including:  Protein, such as meat, poultry, fish, tofu, nuts, and seeds (lean animal proteins are best).  Fruits.  Vegetables.  Dairy products, such as milk, cheese, and yogurt (low fat is best).  Breads, grains, pasta, cereal, rice, and beans.  Fats such as olive oil, trans fat-free margarine, canola oil, avocado, and olives. DOES EVERYONE WITH DIABETES MELLITUS HAVE THE SAME MEAL PLAN? Because every person with diabetes mellitus is different, there is not one meal plan that works for everyone. It is very important that you meet with a dietitian who will help you create a meal plan that is just right for you. Document Released: 05/23/2005 Document Revised: 08/31/2013 Document Reviewed: 07/23/2013 Exit[employer] Patient Information 2015 [employer]. This   information is not intended to replace advice given to you by your health care provider. Make sure you discuss any questions you have with your health care provider. DASH Eating Plan DASH stands for "Dietary Approaches to Stop Hypertension." The DASH eating plan is a healthy eating plan that has been shown to reduce high  blood pressure (hypertension). Additional health benefits may include reducing the risk of type 2 diabetes mellitus, heart disease, and stroke. The DASH eating plan may also help with weight loss. WHAT DO I NEED TO KNOW ABOUT THE DASH EATING PLAN? For the DASH eating plan, you will follow these general guidelines:  Choose foods with a percent daily value for sodium of less than 5% (as listed on the food label).  Use salt-free seasonings or herbs instead of table salt or sea salt.  Check with your health care provider or pharmacist before using salt substitutes.  Eat lower-sodium products, often labeled as "lower sodium" or "no salt added."  Eat fresh foods.  Eat more vegetables, fruits, and low-fat dairy products.  Choose whole grains. Look for the word "whole" as the first word in the ingredient list.  Choose fish and skinless chicken or turkey more often than red meat. Limit fish, poultry, and meat to 6 oz (170 g) each day.  Limit sweets, desserts, sugars, and sugary drinks.  Choose heart-healthy fats.  Limit cheese to 1 oz (28 g) per day.  Eat more home-cooked food and less restaurant, buffet, and fast food.  Limit fried foods.  Cook foods using methods other than frying.  Limit canned vegetables. If you do use them, rinse them well to decrease the sodium.  When eating at a restaurant, ask that your food be prepared with less salt, or no salt if possible. WHAT FOODS CAN I EAT? Seek help from a dietitian for individual calorie needs. Grains Whole grain or whole wheat bread. Brown rice. Whole grain or whole wheat pasta. Quinoa, bulgur, and whole grain cereals. Low-sodium cereals. Corn or whole wheat flour tortillas. Whole grain cornbread. Whole grain crackers. Low-sodium crackers. Vegetables Fresh or frozen vegetables (raw, steamed, roasted, or grilled). Low-sodium or reduced-sodium tomato and vegetable juices. Low-sodium or reduced-sodium tomato sauce and paste. Low-sodium  or reduced-sodium canned vegetables.  Fruits All fresh, canned (in natural juice), or frozen fruits. Meat and Other Protein Products Ground beef (85% or leaner), grass-fed beef, or beef trimmed of fat. Skinless chicken or turkey. Ground chicken or turkey. Pork trimmed of fat. All fish and seafood. Eggs. Dried beans, peas, or lentils. Unsalted nuts and seeds. Unsalted canned beans. Dairy Low-fat dairy products, such as skim or 1% milk, 2% or reduced-fat cheeses, low-fat ricotta or cottage cheese, or plain low-fat yogurt. Low-sodium or reduced-sodium cheeses. Fats and Oils Tub margarines without trans fats. Light or reduced-fat mayonnaise and salad dressings (reduced sodium). Avocado. Safflower, olive, or canola oils. Natural peanut or almond butter. Other Unsalted popcorn and pretzels. The items listed above may not be a complete list of recommended foods or beverages. Contact your dietitian for more options. WHAT FOODS ARE NOT RECOMMENDED? Grains White bread. White pasta. White rice. Refined cornbread. Bagels and croissants. Crackers that contain trans fat. Vegetables Creamed or fried vegetables. Vegetables in a cheese sauce. Regular canned vegetables. Regular canned tomato sauce and paste. Regular tomato and vegetable juices. Fruits Dried fruits. Canned fruit in light or heavy syrup. Fruit juice. Meat and Other Protein Products Fatty cuts of meat. Ribs, chicken wings, bacon, sausage, bologna, salami, chitterlings, fatback, hot   dogs, bratwurst, and packaged luncheon meats. Salted nuts and seeds. Canned beans with salt. Dairy Whole or 2% milk, cream, half-and-half, and cream cheese. Whole-fat or sweetened yogurt. Full-fat cheeses or blue cheese. Nondairy creamers and whipped toppings. Processed cheese, cheese spreads, or cheese curds. Condiments Onion and garlic salt, seasoned salt, table salt, and sea salt. Canned and packaged gravies. Worcestershire sauce. Tartar sauce. Barbecue sauce.  Teriyaki sauce. Soy sauce, including reduced sodium. Steak sauce. Fish sauce. Oyster sauce. Cocktail sauce. Horseradish. Ketchup and mustard. Meat flavorings and tenderizers. Bouillon cubes. Hot sauce. Tabasco sauce. Marinades. Taco seasonings. Relishes. Fats and Oils Butter, stick margarine, lard, shortening, ghee, and bacon fat. Coconut, palm kernel, or palm oils. Regular salad dressings. Other Pickles and olives. Salted popcorn and pretzels. The items listed above may not be a complete list of foods and beverages to avoid. Contact your dietitian for more information. WHERE CAN I FIND MORE INFORMATION? National [employer], [employer], [employer]: www.nhlbi.nih.gov/health/health-topics/topics/dash/ Document Released: 08/15/2011 Document Revised: 01/10/2014 Document Reviewed: 06/30/2013 Exit[employer] Patient Information 2015 [employer]. This information is not intended to replace advice given to you by your health care provider. Make sure you discuss any questions you have with your health care provider.  

## 2015-02-14 NOTE — Progress Notes (Signed)
MRN: [id number] Name: Tina Gomez  Sex: female Age: 59 y.o. DOB: 18-Oct-1955  Allergies: Codeine  Chief Complaint  Patient presents with  . Follow-up    HPI: Patient is 59 y.o. female who has history of hypertension, diabetes, hyperlipidemia comes today for followup as per patient she ran out of her medications for the last few days, today her blood pressure is borderline elevated, denies any headache dizziness chest and shortness of breath, she is requesting refill on her medications, also noted her hemoglobin A1c has slightly trended up.  Past Medical History  Diagnosis Date  . Hypertension   . Stroke     Past Surgical History  Procedure Laterality Date  . Cesarean section        Medication List       This list is accurate as of: 02/14/15 11:42 AM.  Always use your most recent med list.               amLODipine 10 MG tablet  Commonly known as:  NORVASC  Take 1 tablet (10 mg total) by mouth daily.     aspirin 325 MG tablet  Take 1 tablet (325 mg total) by mouth daily.     atorvastatin 20 MG tablet  Commonly known as:  LIPITOR  Take 1 tablet (20 mg total) by mouth daily at 6 PM.     blood glucose meter kit and supplies  Use as instructed     cyclobenzaprine 5 MG tablet  Commonly known as:  FLEXERIL  Take 1 tablet (5 mg total) by mouth 3 (three) times daily as needed for muscle spasms.     fluconazole 150 MG tablet  Commonly known as:  DIFLUCAN  Take 1 tablet (150 mg total) by mouth once.     freestyle lancets  Use as instructed     glucose blood test strip  Commonly known as:  TRUETEST TEST  Use as instructed     hydrochlorothiazide 25 MG tablet  Commonly known as:  HYDRODIURIL  Take 1 tablet (25 mg total) by mouth daily.     lisinopril 40 MG tablet  Commonly known as:  PRINIVIL,ZESTRIL  Take 1 tablet (40 mg total) by mouth daily.     metFORMIN 500 MG tablet  Commonly known as:  GLUCOPHAGE  Take 1 tablet (500 mg total) by mouth 2 (two)  times daily with a meal.     pantoprazole 40 MG tablet  Commonly known as:  PROTONIX  Take 1 tablet (40 mg total) by mouth daily.        Meds ordered this encounter  Medications  . amLODipine (NORVASC) 10 MG tablet    Sig: Take 1 tablet (10 mg total) by mouth daily.    Dispense:  30 tablet    Refill:  3  . atorvastatin (LIPITOR) 20 MG tablet    Sig: Take 1 tablet (20 mg total) by mouth daily at 6 PM.    Dispense:  30 tablet    Refill:  7  . hydrochlorothiazide (HYDRODIURIL) 25 MG tablet    Sig: Take 1 tablet (25 mg total) by mouth daily.    Dispense:  90 tablet    Refill:  7  . lisinopril (PRINIVIL,ZESTRIL) 40 MG tablet    Sig: Take 1 tablet (40 mg total) by mouth daily.    Dispense:  30 tablet    Refill:  7  . metFORMIN (GLUCOPHAGE) 500 MG tablet    Sig: Take 1 tablet (500  mg total) by mouth 2 (two) times daily with a meal.    Dispense:  60 tablet    Refill:  7  . pantoprazole (PROTONIX) 40 MG tablet    Sig: Take 1 tablet (40 mg total) by mouth daily.    Dispense:  30 tablet    Refill:  3    Immunization History  Administered Date(s) Administered  . Influenza,inj,Quad PF,36+ Mos 08/22/2014  . Pneumococcal Polysaccharide-23 10/23/2013    Family History  Problem Relation Age of Onset  . Stroke Mother   . Hypertension Mother   . Hypertension Father   . Hyperlipidemia Mother   . Hyperlipidemia Father     History  Substance Use Topics  . Smoking status: Current Every Day Smoker -- 1.00 packs/day for 10 years    Types: Cigarettes  . Smokeless tobacco: Not on file  . Alcohol Use: No    Review of Systems   As noted in HPI  Filed Vitals:   02/14/15 0937  BP: 150/70  Pulse:   Temp:   Resp:     Physical Exam  Physical Exam  Constitutional: No distress.  Cardiovascular: Normal rate and regular rhythm.   Pulmonary/Chest: Breath sounds normal. No respiratory distress. She has no wheezes. She has no rales.  Musculoskeletal: She exhibits no edema.     CBC    Component Value Date/Time   WBC 8.8 10/30/2013 1202   RBC 4.47 10/30/2013 1202   HGB 13.1 10/30/2013 1202   HCT 38.4 10/30/2013 1202   PLT 250 10/30/2013 1202   MCV 85.9 10/30/2013 1202   LYMPHSABS 1.8 10/30/2013 1202   MONOABS 0.8 10/30/2013 1202   EOSABS 0.1 10/30/2013 1202   BASOSABS 0.0 10/30/2013 1202    CMP     Component Value Date/Time   NA 138 09/06/2014 0858   K 4.9 09/06/2014 0858   CL 104 09/06/2014 0858   CO2 26 09/06/2014 0858   GLUCOSE 148* 09/06/2014 0858   BUN 38* 09/06/2014 0858   CREATININE 1.33* 09/06/2014 0858   CREATININE 0.77 10/30/2013 1202   CALCIUM 9.2 09/06/2014 0858   PROT 7.0 09/06/2014 0858   ALBUMIN 4.0 09/06/2014 0858   AST 10 09/06/2014 0858   ALT 8 09/06/2014 0858   ALKPHOS 92 09/06/2014 0858   BILITOT 0.3 09/06/2014 0858   GFRNONAA 44* 09/06/2014 0858   GFRNONAA >90 10/30/2013 1202   GFRAA 51* 09/06/2014 0858   GFRAA >90 10/30/2013 1202    Lab Results  Component Value Date/Time   CHOL 172 09/06/2014 08:58 AM    Lab Results  Component Value Date/Time   HGBA1C 7.60 02/14/2015 09:09 AM   HGBA1C 9.4* 10/23/2013 02:50 AM    Lab Results  Component Value Date/Time   AST 10 09/06/2014 08:58 AM    Assessment and Plan  Other specified diabetes mellitus without complications - Plan:  Results for orders placed or performed in visit on 02/14/15  Glucose (CBG)  Result Value Ref Range   POC Glucose 199.0 (A) 70 - 99 mg/dl  HgB A1c  Result Value Ref Range   Hemoglobin A1C 7.60    HgB A1c has trended up compared to last visit, patient is advised for diabetes meal planning, resume back on  metFORMIN (GLUCOPHAGE) 500 MG tablet, repeat A1c in 3 months  Essential hypertension, benign - Plan:advised patient for DASH diet, resume back on  amLODipine (NORVASC) 10 MG tablet, hydrochlorothiazide (HYDRODIURIL) 25 MG tablet, lisinopril (PRINIVIL,ZESTRIL) 40 MG tablet  Hyperlipidemia - Plan: continue  with atorvastatin (LIPITOR)  20 MG tablet, we'll check fasting lipid panel on the following visit  History of stroke Continue with aspirin and statins  Gastroesophageal reflux disease, esophagitis presence not specified - Plan: lifestyle modification, continue with pantoprazole (PROTONIX) 40 MG tablet     Return in about 3 months (around 05/17/2015), or if symptoms worsen or fail to improve.   This note has been created with Surveyor, quantity. Any transcriptional errors are unintentional.    Lorayne Marek, MD

## 2015-02-14 NOTE — Progress Notes (Signed)
Patient here for follow up on her diabetes and hypertension Patient has been out of her blood pressure medications since last Friday Requesting refills on her medications as well

## 2015-06-26 ENCOUNTER — Other Ambulatory Visit: Payer: Self-pay | Admitting: Internal Medicine

## 2015-07-06 ENCOUNTER — Other Ambulatory Visit: Payer: Self-pay

## 2015-07-06 DIAGNOSIS — I10 Essential (primary) hypertension: Secondary | ICD-10-CM

## 2015-07-06 MED ORDER — AMLODIPINE BESYLATE 10 MG PO TABS: 10.0000 mg | ORAL_TABLET | Freq: Every day | ORAL | Status: DC

## 2015-07-06 NOTE — Telephone Encounter (Signed)
Patient is checking on the status of her medication refill for amLODipine (NORVASC) 10 MG tablet. Please follow up with pt. Thank you.

## 2015-09-29 MED FILL — HYDROCHLOROTHIAZIDE 25 MG T: 25 | 30 days supply | Qty: 30 | Fill #6

## 2015-09-29 MED FILL — LISINOPRIL 40 MG TABLET: 40 | 30 days supply | Qty: 30 | Fill #2

## 2015-09-29 MED FILL — ?METFORMIN HCL 500MG TABLET: 500 | 30 days supply | Qty: 60 | Fill #1

## 2015-09-29 MED FILL — ?ATORVASTATIN 20 MG TABLET: 20 | 30 days supply | Qty: 30 | Fill #3

## 2015-09-29 MED FILL — AMLODIPINE BESYLATE 10 MG T: 10 | 30 days supply | Qty: 30 | Fill #2

## 2015-09-29 MED FILL — PANTOPRAZOLE SOD DR 40 MG T: 40 | 30 days supply | Qty: 30 | Fill #3

## 2015-11-10 ENCOUNTER — Other Ambulatory Visit: Payer: Self-pay | Admitting: Internal Medicine

## 2015-11-10 MED FILL — LISINOPRIL 40 MG TABLET: 40 | 30 days supply | Qty: 30 | Fill #3

## 2015-11-10 MED FILL — HYDROCHLOROTHIAZIDE 25 MG T: 25 | 30 days supply | Qty: 30 | Fill #7

## 2015-11-10 MED FILL — ATORVASTATIN 20 MG TABLET: 20 | 30 days supply | Qty: 30 | Fill #4

## 2015-11-10 MED FILL — ?METFORMIN HCL 500MG TABLET: 500 | 30 days supply | Qty: 60 | Fill #2

## 2015-11-10 MED FILL — AMLODIPINE BESYLATE 10 MG T: 10 | 30 days supply | Qty: 30 | Fill #3

## 2015-11-13 MED FILL — PANTOPRAZOLE SOD DR 40 MG T: 40 | 30 days supply | Qty: 30 | Fill #0

## 2015-12-27 ENCOUNTER — Other Ambulatory Visit: Payer: Self-pay | Admitting: Internal Medicine

## 2015-12-27 MED FILL — ?ATORVASTATIN 20 MG TABLET: 20 | 30 days supply | Qty: 30 | Fill #5

## 2015-12-27 MED FILL — LISINOPRIL 40 MG TABLET: 40 | 30 days supply | Qty: 30 | Fill #4

## 2015-12-27 MED FILL — ?HYDROCHLOROTHIAZIDE 25 MG: 25 MG | 30 days supply | Qty: 30 | Fill #8

## 2015-12-27 MED FILL — ?AMLODIPINE BESYLATE 10 MG: 10 | 30 days supply | Qty: 30 | Fill #0

## 2015-12-27 MED FILL — ?PANTOPRAZOLE SOD DR 40MG: 40 MG | 30 days supply | Qty: 30 | Fill #0

## 2016-01-24 ENCOUNTER — Other Ambulatory Visit: Payer: Self-pay | Admitting: Internal Medicine

## 2016-01-24 MED FILL — ?METFORMIN HCL 500MG TABLET: 500 | 30 days supply | Qty: 60 | Fill #3

## 2016-01-24 MED FILL — ?PANTOPRAZOLE SOD DR 40MG: 40 MG | 30 days supply | Qty: 30 | Fill #1

## 2016-01-24 MED FILL — ATORVASTATIN 20 MG TABLET: 20 | 30 days supply | Qty: 30 | Fill #6

## 2016-01-24 MED FILL — ?HYDROCHLOROTHIAZIDE 25 MG: 25 MG | 30 days supply | Qty: 30 | Fill #9

## 2016-01-24 MED FILL — LISINOPRIL 40 MG TABLET: 40 | 30 days supply | Qty: 30 | Fill #5

## 2016-02-14 ENCOUNTER — Ambulatory Visit: Payer: Self-pay | Attending: Family Medicine | Admitting: Family Medicine

## 2016-02-14 ENCOUNTER — Encounter: Payer: Self-pay | Admitting: Family Medicine

## 2016-02-14 VITALS — BP 163/82 | HR 70 | Temp 97.9°F | Resp 16 | Ht 62.0 in | Wt 213.8 lb

## 2016-02-14 DIAGNOSIS — E139 Other specified diabetes mellitus without complications: Secondary | ICD-10-CM

## 2016-02-14 DIAGNOSIS — I10 Essential (primary) hypertension: Secondary | ICD-10-CM | POA: Insufficient documentation

## 2016-02-14 DIAGNOSIS — Z23 Encounter for immunization: Secondary | ICD-10-CM

## 2016-02-14 DIAGNOSIS — F17210 Nicotine dependence, cigarettes, uncomplicated: Secondary | ICD-10-CM | POA: Insufficient documentation

## 2016-02-14 DIAGNOSIS — L409 Psoriasis, unspecified: Secondary | ICD-10-CM | POA: Insufficient documentation

## 2016-02-14 DIAGNOSIS — E119 Type 2 diabetes mellitus without complications: Secondary | ICD-10-CM | POA: Insufficient documentation

## 2016-02-14 DIAGNOSIS — Z79899 Other long term (current) drug therapy: Secondary | ICD-10-CM | POA: Insufficient documentation

## 2016-02-14 DIAGNOSIS — E785 Hyperlipidemia, unspecified: Secondary | ICD-10-CM | POA: Insufficient documentation

## 2016-02-14 DIAGNOSIS — Z7982 Long term (current) use of aspirin: Secondary | ICD-10-CM | POA: Insufficient documentation

## 2016-02-14 DIAGNOSIS — K219 Gastro-esophageal reflux disease without esophagitis: Secondary | ICD-10-CM | POA: Insufficient documentation

## 2016-02-14 DIAGNOSIS — Z8673 Personal history of transient ischemic attack (TIA), and cerebral infarction without residual deficits: Secondary | ICD-10-CM | POA: Insufficient documentation

## 2016-02-14 DIAGNOSIS — Z7984 Long term (current) use of oral hypoglycemic drugs: Secondary | ICD-10-CM | POA: Insufficient documentation

## 2016-02-14 LAB — GLUCOSE, POCT (MANUAL RESULT ENTRY): POC GLUCOSE: 301 mg/dL — AB (ref 70–99)

## 2016-02-14 LAB — POCT GLYCOSYLATED HEMOGLOBIN (HGB A1C): HEMOGLOBIN A1C: 10.6

## 2016-02-14 MED ORDER — LISINOPRIL 40 MG PO TABS: 40.0000 mg | ORAL_TABLET | Freq: Every day | ORAL | Status: DC

## 2016-02-14 MED ORDER — METFORMIN HCL 500 MG PO TABS: 1000.0000 mg | ORAL_TABLET | Freq: Two times a day (BID) | ORAL | Status: DC

## 2016-02-14 MED ORDER — PANTOPRAZOLE SODIUM 40 MG PO TBEC: DELAYED_RELEASE_TABLET | ORAL | Status: DC

## 2016-02-14 MED ORDER — AMLODIPINE BESYLATE 10 MG PO TABS: 10.0000 mg | ORAL_TABLET | Freq: Every day | ORAL | Status: DC

## 2016-02-14 MED ORDER — TRIAMCINOLONE ACETONIDE 0.1 % EX CREA: 1.0000 "application " | TOPICAL_CREAM | Freq: Two times a day (BID) | CUTANEOUS | Status: DC

## 2016-02-14 MED ORDER — ATORVASTATIN CALCIUM 20 MG PO TABS: 20.0000 mg | ORAL_TABLET | Freq: Every day | ORAL | Status: DC

## 2016-02-14 MED ORDER — HYDROCHLOROTHIAZIDE 25 MG PO TABS: 25.0000 mg | ORAL_TABLET | Freq: Every day | ORAL | Status: DC

## 2016-02-14 MED FILL — AMLODIPINE BESYLATE 10 MG T: 10 | 30 days supply | Qty: 30 | Fill #0

## 2016-02-14 MED FILL — ?ATORVASTATIN 20 MG TABLET: 20 | 30 days supply | Qty: 30 | Fill #0

## 2016-02-14 MED FILL — ?PANTOPRAZOLE SOD DR 40MG: 40 MG | 30 days supply | Qty: 30 | Fill #0

## 2016-02-14 MED FILL — TRIAMCINOLONE 0.1% CREAM: 0.1 | 15 days supply | Qty: 30 | Fill #0

## 2016-02-14 MED FILL — HYDROCHLOROTHIAZIDE 25 MG T: 25 | 30 days supply | Qty: 30 | Fill #0

## 2016-02-14 MED FILL — metFORMIN HCL 500 MG TABS: 500 | 30 days supply | Qty: 120 | Fill #0

## 2016-02-14 MED FILL — LISINOPRIL 40 MG TABLET: 40 | 30 days supply | Qty: 30 | Fill #0

## 2016-02-14 NOTE — Progress Notes (Signed)
Pt here for med refill and ear issue. Pt reports pain in left ear that has been present for a few weeks, but denies a fever.  Pt reports she is going to [place] soon for birth of grandchild and Dr requests everyone have booster for pertussis. Pt needs a refill on amlodipine and has been out of it for 3 weeks.  Pt is going to be gone all summer so she would like to have refills that last her on all her medications. Pt CBG is 301 and A1C is 10.6. Pt ate a ham biscuit this morning.

## 2016-02-14 NOTE — Patient Instructions (Addendum)
Diabetes Mellitus and Food It is important for you to manage your blood sugar (glucose) level. Your blood glucose level can be greatly affected by what you eat. Eating healthier foods in the appropriate amounts throughout the day at about the same time each day will help you control your blood glucose level. It can also help slow or prevent worsening of your diabetes mellitus. Healthy eating may even help you improve the level of your blood pressure and reach or maintain a healthy weight.  General recommendations for healthful eating and cooking habits include:  Eating meals and snacks regularly. Avoid going long periods of time without eating to lose weight.  Eating a diet that consists mainly of plant-based foods, such as fruits, vegetables, nuts, legumes, and whole grains.  Using low-heat cooking methods, such as baking, instead of high-heat cooking methods, such as deep frying. Work with your dietitian to make sure you understand how to use the Nutrition Facts information on food labels. HOW CAN FOOD AFFECT ME? Carbohydrates Carbohydrates affect your blood glucose level more than any other type of food. Your dietitian will help you determine how many carbohydrates to eat at each meal and teach you how to count carbohydrates. Counting carbohydrates is important to keep your blood glucose at a healthy level, especially if you are using insulin or taking certain medicines for diabetes mellitus. Alcohol Alcohol can cause sudden decreases in blood glucose (hypoglycemia), especially if you use insulin or take certain medicines for diabetes mellitus. Hypoglycemia can be a life-threatening condition. Symptoms of hypoglycemia (sleepiness, dizziness, and disorientation) are similar to symptoms of having too much alcohol.  If your health care provider has given you approval to drink alcohol, do so in moderation and use the following guidelines:  Women should not have more than one drink per day, and men  should not have more than two drinks per day. One drink is equal to:  12 oz of beer.  5 oz of wine.  1 oz of hard liquor.  Do not drink on an empty stomach.  Keep yourself hydrated. Have water, diet soda, or unsweetened iced tea.  Regular soda, juice, and other mixers might contain a lot of carbohydrates and should be counted. WHAT FOODS ARE NOT RECOMMENDED? As you make food choices, it is important to remember that all foods are not the same. Some foods have fewer nutrients per serving than other foods, even though they might have the same number of calories or carbohydrates. It is difficult to get your body what it needs when you eat foods with fewer nutrients. Examples of foods that you should avoid that are high in calories and carbohydrates but low in nutrients include:  Trans fats (most processed foods list trans fats on the Nutrition Facts label).  Regular soda.  Juice.  Candy.  Sweets, such as cake, pie, doughnuts, and cookies.  Fried foods. WHAT FOODS CAN I EAT? Eat nutrient-rich foods, which will nourish your body and keep you healthy. The food you should eat also will depend on several factors, including:  The calories you need.  The medicines you take.  Your weight.  Your blood glucose level.  Your blood pressure level.  Your cholesterol level. You should eat a variety of foods, including:  Protein.  Lean cuts of meat.  Proteins low in saturated fats, such as fish, egg whites, and beans. Avoid processed meats.  Fruits and vegetables.  Fruits and vegetables that may help control blood glucose levels, such as apples, mangoes, and   yams.  Dairy products.  Choose fat-free or low-fat dairy products, such as milk, yogurt, and cheese.  Grains, bread, pasta, and rice.  Choose whole grain products, such as multigrain bread, whole oats, and brown rice. These foods may help control blood pressure.  Fats.  Foods containing healthful fats, such as nuts,  avocado, olive oil, canola oil, and fish. DOES EVERYONE WITH DIABETES MELLITUS HAVE THE SAME MEAL PLAN? Because every person with diabetes mellitus is different, there is not one meal plan that works for everyone. It is very important that you meet with a dietitian who will help you create a meal plan that is just right for you.   This information is not intended to replace advice given to you by your health care provider. Make sure you discuss any questions you have with your health care provider.   Document Released: 05/23/2005 Document Revised: 09/16/2014 Document Reviewed: 07/23/2013 Elsevier Interactive Patient Education 2016 [employer].    Tdap Vaccine (Tetanus, Diphtheria and Pertussis): What You Need to Know 1. Why get vaccinated? Tetanus, diphtheria and pertussis are very serious diseases. Tdap vaccine can protect [place] from these diseases. And, Tdap vaccine given to pregnant women can protect newborn babies against pertussis. TETANUS (Lockjaw) is rare in the [place] today. It causes painful muscle tightening and stiffness, usually all over the body.  It can lead to tightening of muscles in the head and neck so you can't open your mouth, swallow, or sometimes even breathe. Tetanus kills about 1 out of 10 people who are infected even after receiving the best medical care. DIPHTHERIA is also rare in the [place] today. It can cause a thick coating to form in the back of the throat.  It can lead to breathing problems, heart failure, paralysis, and death. PERTUSSIS (Whooping Cough) causes severe coughing spells, which can cause difficulty breathing, vomiting and disturbed sleep.  It can also lead to weight loss, incontinence, and rib fractures. Up to 2 in 100 adolescents and 5 in 100 adults with pertussis are hospitalized or have complications, which could include pneumonia or death. These diseases are caused by bacteria. Diphtheria and pertussis are spread from person to  person through secretions from coughing or sneezing. Tetanus enters the body through cuts, scratches, or wounds. Before vaccines, as many as 200,000 cases of diphtheria, 200,000 cases of pertussis, and hundreds of cases of tetanus, were reported in the [place] each year. Since vaccination began, reports of cases for tetanus and diphtheria have dropped by about 99% and for pertussis by about 80%. 2. Tdap vaccine Tdap vaccine can protect adolescents and adults from tetanus, diphtheria, and pertussis. One dose of Tdap is routinely given at age 64 or 70. People who did not get Tdap at that age should get it as soon as possible. Tdap is especially important for healthcare professionals and anyone having close contact with a baby younger than 12 months. Pregnant women should get a dose of Tdap during every pregnancy, to protect the newborn from pertussis. Infants are most at risk for severe, life-threatening complications from pertussis. Another vaccine, called Td, protects against tetanus and diphtheria, but not pertussis. A Td booster should be given every 10 years. Tdap may be given as one of these boosters if you have never gotten Tdap before. Tdap may also be given after a severe cut or burn to prevent tetanus infection. Your doctor or the person giving you the vaccine can give you more information. Tdap may safely be given  at the same time as other vaccines. 3. Some people should not get this vaccine  A person who has ever had a life-threatening allergic reaction after a previous dose of any diphtheria, tetanus or pertussis containing vaccine, OR has a severe allergy to any part of this vaccine, should not get Tdap vaccine. Tell the person giving the vaccine about any severe allergies.  Anyone who had coma or long repeated seizures within 7 days after a childhood dose of DTP or DTaP, or a previous dose of Tdap, should not get Tdap, unless a cause other than the vaccine was found. They can still  get Td.  Talk to your doctor if you:  have seizures or another nervous system problem,  had severe pain or swelling after any vaccine containing diphtheria, tetanus or pertussis,  ever had a condition called Guillain-Barr Syndrome (GBS),  aren't feeling well on the day the shot is scheduled. 4. Risks With any medicine, including vaccines, there is a chance of side effects. These are usually mild and go away on their own. Serious reactions are also possible but are rare. Most people who get Tdap vaccine do not have any problems with it. Mild problems following Tdap (Did not interfere with activities)  Pain where the shot was given (about 3 in 4 adolescents or 2 in 3 adults)  Redness or swelling where the shot was given (about 1 person in 5)  Mild fever of at least 100.53F (up to about 1 in 25 adolescents or 1 in 100 adults)  Headache (about 3 or 4 people in 10)  Tiredness (about 1 person in 3 or 4)  Nausea, vomiting, diarrhea, stomach ache (up to 1 in 4 adolescents or 1 in 10 adults)  Chills, sore joints (about 1 person in 10)  Body aches (about 1 person in 3 or 4)  Rash, swollen glands (uncommon) Moderate problems following Tdap (Interfered with activities, but did not require medical attention)  Pain where the shot was given (up to 1 in 5 or 6)  Redness or swelling where the shot was given (up to about 1 in 16 adolescents or 1 in 12 adults)  Fever over 102F (about 1 in 100 adolescents or 1 in 250 adults)  Headache (about 1 in 7 adolescents or 1 in 10 adults)  Nausea, vomiting, diarrhea, stomach ache (up to 1 or 3 people in 100)  Swelling of the entire arm where the shot was given (up to about 1 in 500). Severe problems following Tdap (Unable to perform usual activities; required medical attention)  Swelling, severe pain, bleeding and redness in the arm where the shot was given (rare). Problems that could happen after any vaccine:  People sometimes faint after  a medical procedure, including vaccination. Sitting or lying down for about 15 minutes can help prevent fainting, and injuries caused by a fall. Tell your doctor if you feel dizzy, or have vision changes or ringing in the ears.  Some people get severe pain in the shoulder and have difficulty moving the arm where a shot was given. This happens very rarely.  Any medication can cause a severe allergic reaction. Such reactions from a vaccine are very rare, estimated at fewer than 1 in a million doses, and would happen within a few minutes to a few hours after the vaccination. As with any medicine, there is a very remote chance of a vaccine causing a serious injury or death. The safety of vaccines is always being monitored. For more information,  visit: http://www.aguilar.org/ 5. What if there is a serious problem? What should I look for?  Look for anything that concerns you, such as signs of a severe allergic reaction, very high fever, or unusual behavior.  Signs of a severe allergic reaction can include hives, swelling of the face and throat, difficulty breathing, a fast heartbeat, dizziness, and weakness. These would usually start a few minutes to a few hours after the vaccination. What should I do?  If you think it is a severe allergic reaction or other emergency that can't wait, call 9-1-1 or get the person to the nearest hospital. Otherwise, call your doctor.  Afterward, the reaction should be reported to the Vaccine Adverse Event Reporting System (VAERS). Your doctor might file this report, or you can do it yourself through the VAERS web site at www.vaers.SamedayNews.es, or by calling 828-434-5644. VAERS does not give medical advice.  6. The National Vaccine Injury Compensation Program The [employer] Vaccine Injury Compensation Program (VICP) is a federal program that was created to compensate people who may have been injured by certain vaccines. Persons who believe they may have been injured by a  vaccine can learn about the program and about filing a claim by calling 514 753 0706 or visiting the [employer] website at GoldCloset.com.ee. There is a time limit to file a claim for compensation. 7. How can I learn more?  Ask your doctor. He or she can give you the vaccine package insert or suggest other sources of information.  Call your local or state health department.  Contact the Centers for Disease Control and Prevention (CDC):  Call 604-005-2400 (1-800-CDC-INFO) or  Visit CDC's website at http://hunter.com/ CDC Tdap Vaccine VIS (11/02/13)   This information is not intended to replace advice given to you by your health care provider. Make sure you discuss any questions you have with your health care provider.   Document Released: 02/25/2012 Document Revised: 09/16/2014 Document Reviewed: 12/08/2013 Elsevier Interactive Patient Education [employer].

## 2016-02-15 ENCOUNTER — Ambulatory Visit: Payer: Self-pay | Attending: Family Medicine

## 2016-02-15 DIAGNOSIS — E119 Type 2 diabetes mellitus without complications: Secondary | ICD-10-CM | POA: Insufficient documentation

## 2016-02-15 LAB — COMPLETE METABOLIC PANEL WITH GFR
ALT: 9 U/L (ref 6–29)
AST: 10 U/L (ref 10–35)
Albumin: 3.8 g/dL (ref 3.6–5.1)
Alkaline Phosphatase: 133 U/L — ABNORMAL HIGH (ref 33–130)
BUN: 28 mg/dL — ABNORMAL HIGH (ref 7–25)
CO2: 26 mmol/L (ref 20–31)
Calcium: 8.8 mg/dL (ref 8.6–10.4)
Chloride: 101 mmol/L (ref 98–110)
Creat: 1.21 mg/dL — ABNORMAL HIGH (ref 0.50–1.05)
GFR, EST NON AFRICAN AMERICAN: 49 mL/min — AB (ref >=60)
GFR, Est African American: 57 mL/min — ABNORMAL LOW (ref >=60)
GLUCOSE: 232 mg/dL — AB (ref 65–99)
POTASSIUM: 4.3 mmol/L (ref 3.5–5.3)
SODIUM: 135 mmol/L (ref 135–146)
TOTAL PROTEIN: 7.1 g/dL (ref 6.1–8.1)
Total Bilirubin: 0.3 mg/dL (ref 0.2–1.2)

## 2016-02-15 LAB — LIPID PANEL
CHOL/HDL RATIO: 4.5 ratio (ref <=5.0)
Cholesterol: 175 mg/dL (ref 125–200)
HDL: 39 mg/dL — AB (ref >=46)
LDL CALC: 96 mg/dL (ref <130)
Triglycerides: 199 mg/dL — ABNORMAL HIGH (ref <150)
VLDL: 40 mg/dL — AB (ref <30)

## 2016-02-15 NOTE — Patient Instructions (Signed)
Patient is aware of receiving a FU regarding results.

## 2016-02-15 NOTE — Progress Notes (Signed)
Subjective:  Patient ID: Tina Gomez, female    DOB: 11/25/1955  Age: 60 y.o. MRN: [id number]  CC: Medication Refill   HPI KIRSTIN KUGLER is a 60 year old female with history of hypertension, hyperlipidemia, type 2 diabetes mellitus (A1c 10.6 from today), GERD who comes into the clinic to establish care with me.  She has been out of her medications hence elevated blood pressure. Admits to some dietary indiscretions and has been drinking a lot of soda; she has not been compliant checking her blood sugars.  Complains of pain in her left ear lobe with some peeling of the skin which is typical of her psoriatic lesions. Denies hearing loss ,discharge from the ears or tinnitus. She is expecting a new grandchild and is interested in getting a TDap shot she will be traveling to [place].  She denies chest pains, shortness of breath; reflux symptoms have been controlled on her PPI.  Past Medical History  Diagnosis Date  . Hypertension   . Stroke [facility])     Past Surgical History  Procedure Laterality Date  . Cesarean section      Social History   Social History  . Marital Status: Divorced    Spouse Name: N/A  . Number of Children: N/A  . Years of Education: N/A   Occupational History  . Not on file.   Social History Main Topics  . Smoking status: Current Every Day Smoker -- 0.25 packs/day for 10 years    Types: Cigarettes  . Smokeless tobacco: Not on file  . Alcohol Use: No  . Drug Use: Not on file  . Sexual Activity: Not on file   Other Topics Concern  . Not on file   Social History Narrative     Outpatient Prescriptions Prior to Visit  Medication Sig Dispense Refill  . aspirin 325 MG tablet Take 1 tablet (325 mg total) by mouth daily. 30 tablet 7  . Blood Glucose Monitoring Suppl (BLOOD GLUCOSE METER) kit Use as instructed 1 each 0  . glucose blood (TRUETEST TEST) test strip Use as instructed 100 each 12  . Lancets (FREESTYLE) lancets Use as instructed 100  each 12  . amLODipine (NORVASC) 10 MG tablet Take 1 tablet (10 mg total) by mouth daily. Must have office visit for refills 30 tablet 0  . atorvastatin (LIPITOR) 20 MG tablet Take 1 tablet (20 mg total) by mouth daily at 6 PM. 30 tablet 7  . cyclobenzaprine (FLEXERIL) 5 MG tablet Take 1 tablet (5 mg total) by mouth 3 (three) times daily as needed for muscle spasms. 90 tablet 5  . fluconazole (DIFLUCAN) 150 MG tablet Take 1 tablet (150 mg total) by mouth once. 1 tablet 0  . hydrochlorothiazide (HYDRODIURIL) 25 MG tablet Take 1 tablet (25 mg total) by mouth daily. 90 tablet 7  . lisinopril (PRINIVIL,ZESTRIL) 40 MG tablet Take 1 tablet (40 mg total) by mouth daily. 30 tablet 7  . metFORMIN (GLUCOPHAGE) 500 MG tablet Take 1 tablet (500 mg total) by mouth 2 (two) times daily with a meal. 60 tablet 7  . pantoprazole (PROTONIX) 40 MG tablet TAKE 1 TABLET BY MOUTH DAILY. PATIENT NEEDS OFFICE VISIT 30 tablet 0   No facility-administered medications prior to visit.    ROS Review of Systems  Constitutional: Negative for activity change, appetite change and fatigue.  HENT: Positive for ear pain. Negative for congestion, sinus pressure and sore throat.   Eyes: Negative for visual disturbance.  Respiratory: Negative for cough, chest  tightness, shortness of breath and wheezing.   Cardiovascular: Negative for chest pain and palpitations.  Gastrointestinal: Negative for abdominal pain, constipation and abdominal distention.  Endocrine: Negative for polydipsia.  Genitourinary: Negative for dysuria and frequency.  Musculoskeletal: Negative for back pain and arthralgias.  Skin: Negative for rash.  Neurological: Negative for tremors, light-headedness and numbness.  Hematological: Does not bruise/bleed easily.  Psychiatric/Behavioral: Negative for behavioral problems and agitation.    Objective:  BP 163/82 mmHg  Pulse 70  Temp(Src) 97.9 F (36.6 C) (Oral)  Resp 16  Ht 5' 2"  (1.575 m)  Wt 213 lb 12.8  oz (96.979 kg)  BMI 39.09 kg/m2  SpO2 93%  BP/Weight 02/14/2016 02/14/2015 16/06/9603  Systolic BP 540 981 191  Diastolic BP 82 70 80  Wt. (Lbs) 213.8 207 -  BMI 39.09 36.68 -      Physical Exam  Constitutional: She is oriented to person, place, and time. She appears well-developed and well-nourished.  HENT:  Right Ear: External ear normal.  Mouth/Throat: Oropharynx is clear and moist.  Left external ear with peeling of Pinna, psoriatic plaques. TM: No evidence of infection, no erythema.  Cardiovascular: Normal rate, normal heart sounds and intact distal pulses.   No murmur heard. Pulmonary/Chest: Effort normal and breath sounds normal. She has no wheezes. She has no rales. She exhibits no tenderness.  Abdominal: Soft. Bowel sounds are normal. She exhibits no distension and no mass. There is no tenderness.  Musculoskeletal: Normal range of motion.  Neurological: She is alert and oriented to person, place, and time.   Lab Results  Component Value Date   HGBA1C 10.6 02/14/2016     Assessment & Plan:   1. Essential hypertension, benign Uncontrolled due to being out of her antihypertensives. Emphasized compliance Low-sodium, DASH diet - lisinopril (PRINIVIL,ZESTRIL) 40 MG tablet; Take 1 tablet (40 mg total) by mouth daily.  Dispense: 30 tablet; Refill: 3 - hydrochlorothiazide (HYDRODIURIL) 25 MG tablet; Take 1 tablet (25 mg total) by mouth daily.  Dispense: 90 tablet; Refill: 3 - amLODipine (NORVASC) 10 MG tablet; Take 1 tablet (10 mg total) by mouth daily. Must have office visit for refills  Dispense: 30 tablet; Refill: 3  2. Type 2 diabetes mellitus without complication, without long-term current use of insulin (HCC) Uncontrolled with A1c of 10.6 Increase metformin to 1000 mg twice a day If blood sugar remains uncontrolled so I will place her on glipizide Keep blood sugar logs with fasting goals of 80-120 mg/dl, random of less than 180 and in the event of sugars less than 60  mg/dl or greater than 400 mg/dl please notify the clinic ASAP. It is recommended that you undergo annual eye exams and annual foot exams. Pneumovax is recommended every 5 years before the age of 29 and once for a lifetime at or after the age of 24. - COMPLETE METABOLIC PANEL WITH GFR; Future - Lipid panel; Future - Microalbumin / creatinine urine ratio; Future - Glucose (CBG) - HgB A1c  3. Gastroesophageal reflux disease without esophagitis Controlled - pantoprazole (PROTONIX) 40 MG tablet; TAKE 1 TABLET BY MOUTH DAILY.  Dispense: 30 tablet; Refill: 3  4. Need for Tdap vaccination - Tdap vaccine greater than or equal to 7yo IM  5. Other specified diabetes mellitus without complications ([place]) - metFORMIN (GLUCOPHAGE) 500 MG tablet; Take 2 tablets (1,000 mg total) by mouth 2 (two) times daily with a meal.  Dispense: 120 tablet; Refill: 3  6. Hyperlipidemia - atorvastatin (LIPITOR) 20 MG tablet; Take  1 tablet (20 mg total) by mouth daily at 6 PM.  Dispense: 30 tablet; Refill: 3  7. Psoriasis Placed on Triamcinolone   Meds ordered this encounter  Medications  . triamcinolone cream (KENALOG) 0.1 %    Sig: Apply 1 application topically 2 (two) times daily.    Dispense:  30 g    Refill:  0  . pantoprazole (PROTONIX) 40 MG tablet    Sig: TAKE 1 TABLET BY MOUTH DAILY.    Dispense:  30 tablet    Refill:  3  . metFORMIN (GLUCOPHAGE) 500 MG tablet    Sig: Take 2 tablets (1,000 mg total) by mouth 2 (two) times daily with a meal.    Dispense:  120 tablet    Refill:  3    Discontinue previous dose  . lisinopril (PRINIVIL,ZESTRIL) 40 MG tablet    Sig: Take 1 tablet (40 mg total) by mouth daily.    Dispense:  30 tablet    Refill:  3  . hydrochlorothiazide (HYDRODIURIL) 25 MG tablet    Sig: Take 1 tablet (25 mg total) by mouth daily.    Dispense:  90 tablet    Refill:  3  . atorvastatin (LIPITOR) 20 MG tablet    Sig: Take 1 tablet (20 mg total) by mouth daily at 6 PM.    Dispense:   30 tablet    Refill:  3  . amLODipine (NORVASC) 10 MG tablet    Sig: Take 1 tablet (10 mg total) by mouth daily. Must have office visit for refills    Dispense:  30 tablet    Refill:  3    Follow-up: Return in about 3 months (around 05/16/2016) for For follow-up of diabetes mellitus.   Arnoldo Morale MD

## 2016-02-16 LAB — MICROALBUMIN / CREATININE URINE RATIO
CREATININE, URINE: 74 mg/dL (ref 20–320)
Microalb Creat Ratio: 151 mcg/mg creat — ABNORMAL HIGH (ref <30)
Microalb, Ur: 11.2 mg/dL

## 2016-02-19 ENCOUNTER — Telehealth: Payer: Self-pay

## 2016-02-19 NOTE — Telephone Encounter (Signed)
Writer called patient to inform her of most recent blood test results per Dr. Jarold Song-  Notes Recorded by Arnoldo Morale, MD on 02/16/2016 at 9:09 AM Cholesterol is normal except for mildy elevated triglycerides which can be improved by taking OTC fish oil caps. Renal function shows a mild decline which could be secondary to uncontrolled Diabetes. Recommend compliance with ADA diet and meds.   Patient stated understanding and will start taking fish oil as well as keeping an eye on her blood sugars and staying on a regimented diet for her diabetes.

## 2016-03-15 MED FILL — ?ATORVASTATIN 20 MG TABLET: 20 | 30 days supply | Qty: 30 | Fill #1

## 2016-03-15 MED FILL — ?METFORMIN HCL 500MG TABLET: 500 | 30 days supply | Qty: 120 | Fill #1

## 2016-03-15 MED FILL — LISINOPRIL 40 MG TABLET: 40 | 30 days supply | Qty: 30 | Fill #1

## 2016-03-15 MED FILL — ?AMLODIPINE BESYLATE 10 MG: 10 | 30 days supply | Qty: 30 | Fill #1

## 2016-03-15 MED FILL — HYDROCHLOROTHIAZIDE 25 MG T: 25 | 30 days supply | Qty: 30 | Fill #1

## 2016-04-16 MED FILL — HYDROCHLOROTHIAZIDE 25 MG T: 25 | 30 days supply | Qty: 30 | Fill #2

## 2016-04-16 MED FILL — ?PANTOPRAZOLE SOD DR 40MG: 40 MG | 30 days supply | Qty: 30 | Fill #1

## 2016-04-16 MED FILL — metFORMIN HCL 500 MG TABS: 500 | 30 days supply | Qty: 120 | Fill #2

## 2016-04-16 MED FILL — ?AMLODIPINE BESYLATE 10 MG: 10 | 30 days supply | Qty: 30 | Fill #2

## 2016-04-16 MED FILL — ?ATORVASTATIN 20 MG TABLET: 20 | 30 days supply | Qty: 30 | Fill #2

## 2016-04-16 MED FILL — LISINOPRIL 40 MG TABLET: 40 | 30 days supply | Qty: 30 | Fill #2

## 2016-05-21 MED FILL — HYDROCHLOROTHIAZIDE 25 MG T: 25 | 30 days supply | Qty: 30 | Fill #3

## 2016-05-21 MED FILL — AMLODIPINE BESYLATE 10 MG T: 10 | 30 days supply | Qty: 30 | Fill #3

## 2016-05-21 MED FILL — ?PANTOPRAZOLE SOD DR 40MG: 40 MG | 30 days supply | Qty: 30 | Fill #2

## 2016-05-21 MED FILL — LISINOPRIL 40 MG TABLET: 40 | 30 days supply | Qty: 30 | Fill #3

## 2016-05-21 MED FILL — ?ATORVASTATIN 20 MG TABLET: 20 | 30 days supply | Qty: 30 | Fill #3

## 2016-06-26 ENCOUNTER — Other Ambulatory Visit: Payer: Self-pay | Admitting: Family Medicine

## 2016-06-26 DIAGNOSIS — E785 Hyperlipidemia, unspecified: Secondary | ICD-10-CM

## 2016-06-26 DIAGNOSIS — I10 Essential (primary) hypertension: Secondary | ICD-10-CM

## 2016-06-26 MED FILL — ATORVASTATIN 20 MG TABLET: 20 | 30 days supply | Qty: 30 | Fill #0

## 2016-06-26 MED FILL — LISINOPRIL 40 MG TABLET: 40 | 30 days supply | Qty: 30 | Fill #0

## 2016-06-26 MED FILL — PANTOPRAZOLE SOD DR 40 MG T: 40 | 30 days supply | Qty: 30 | Fill #3

## 2016-06-26 MED FILL — ?AMLODIPINE BESYLATE 10 MG: 10 | 30 days supply | Qty: 30 | Fill #0

## 2016-06-26 MED FILL — HYDROCHLOROTHIAZIDE 25 MG T: 25 | 30 days supply | Qty: 30 | Fill #4

## 2016-08-05 ENCOUNTER — Other Ambulatory Visit: Payer: Self-pay | Admitting: Family Medicine

## 2016-08-05 DIAGNOSIS — K219 Gastro-esophageal reflux disease without esophagitis: Secondary | ICD-10-CM

## 2016-08-05 DIAGNOSIS — I10 Essential (primary) hypertension: Secondary | ICD-10-CM

## 2016-08-05 MED FILL — HYDROCHLOROTHIAZIDE 25 MG T: 25 | 30 days supply | Qty: 30 | Fill #5

## 2016-08-05 MED FILL — ?LISINOPRIL 40 MG TABLET: 40 MG | 30 days supply | Qty: 30 | Fill #0

## 2016-08-05 MED FILL — ATORVASTATIN 20 MG TABLET: 20 | 30 days supply | Qty: 30 | Fill #1

## 2016-08-05 MED FILL — ?PANTOPRAZOLE SOD DR 40MG: 40 MG | 30 days supply | Qty: 30 | Fill #0

## 2016-08-05 MED FILL — ?AMLODIPINE BESYLATE 10 MG: 10 | 30 days supply | Qty: 30 | Fill #0

## 2016-08-05 MED FILL — ?METFORMIN HCL 500MG TABLET: 500 | 30 days supply | Qty: 120 | Fill #3

## 2016-09-17 ENCOUNTER — Other Ambulatory Visit: Payer: Self-pay | Admitting: Family Medicine

## 2016-09-17 DIAGNOSIS — I10 Essential (primary) hypertension: Secondary | ICD-10-CM

## 2016-09-17 MED FILL — HYDROCHLOROTHIAZIDE 25 MG T: 25 | 30 days supply | Qty: 30 | Fill #6

## 2016-09-17 MED FILL — ?PANTOPRAZOLE SOD DR 40MG: 40 MG | 30 days supply | Qty: 30 | Fill #1

## 2016-09-17 MED FILL — ATORVASTATIN 20 MG TABLET: 20 | 30 days supply | Qty: 30 | Fill #2

## 2016-09-20 ENCOUNTER — Other Ambulatory Visit: Payer: Self-pay | Admitting: Family Medicine

## 2016-09-20 DIAGNOSIS — I10 Essential (primary) hypertension: Secondary | ICD-10-CM

## 2016-09-20 MED FILL — ?AMLODIPINE BESYLATE 10 MG: 10 | 30 days supply | Qty: 30 | Fill #0

## 2016-09-20 MED FILL — metFORMIN HCL 500 MG TABS: 500 | 30 days supply | Qty: 120 | Fill #0

## 2016-09-20 MED FILL — LISINOPRIL 40 MG TABLET: 40 | 30 days supply | Qty: 30 | Fill #0

## 2016-11-04 ENCOUNTER — Other Ambulatory Visit: Payer: Self-pay | Admitting: Family Medicine

## 2016-11-04 DIAGNOSIS — I10 Essential (primary) hypertension: Secondary | ICD-10-CM

## 2016-11-08 ENCOUNTER — Other Ambulatory Visit: Payer: Self-pay | Admitting: Family Medicine

## 2016-11-08 DIAGNOSIS — I10 Essential (primary) hypertension: Secondary | ICD-10-CM

## 2016-11-08 NOTE — Telephone Encounter (Signed)
I have attempted to contact the patient but she has a voicemail box that is full. She must have an office visit for refills per Dr. Jarold Song.

## 2016-11-11 ENCOUNTER — Telehealth: Payer: Self-pay | Admitting: Family Medicine

## 2016-11-11 NOTE — Telephone Encounter (Signed)
I spoke with patient and scheduled her an appointment for this week to see PCP regarding her medication.

## 2016-11-11 NOTE — Telephone Encounter (Signed)
Patient called the office to request medication refill for amLODipine (NORVASC) 10 MG tablet, metFORMIN (GLUCOPHAGE) 500 MG tablet and lisinopril (PRINIVIL,ZESTRIL) 40 MG tablet.  Thank you.

## 2016-11-11 NOTE — Telephone Encounter (Signed)
I have been trying to reach patient - refill requests denied per Dr. Jarold Song. Attempted to call patient back and unable to leave a message. Patient needs office visit.

## 2016-11-13 ENCOUNTER — Encounter: Payer: Self-pay | Admitting: Family Medicine

## 2016-11-13 ENCOUNTER — Ambulatory Visit: Payer: Self-pay | Attending: Family Medicine | Admitting: Family Medicine

## 2016-11-13 VITALS — BP 181/77 | HR 71 | Temp 97.5°F | Ht 62.0 in | Wt 213.4 lb

## 2016-11-13 DIAGNOSIS — Z8673 Personal history of transient ischemic attack (TIA), and cerebral infarction without residual deficits: Secondary | ICD-10-CM | POA: Insufficient documentation

## 2016-11-13 DIAGNOSIS — E1140 Type 2 diabetes mellitus with diabetic neuropathy, unspecified: Secondary | ICD-10-CM | POA: Insufficient documentation

## 2016-11-13 DIAGNOSIS — J302 Other seasonal allergic rhinitis: Secondary | ICD-10-CM

## 2016-11-13 DIAGNOSIS — J309 Allergic rhinitis, unspecified: Secondary | ICD-10-CM | POA: Insufficient documentation

## 2016-11-13 DIAGNOSIS — L84 Corns and callosities: Secondary | ICD-10-CM

## 2016-11-13 DIAGNOSIS — Z885 Allergy status to narcotic agent status: Secondary | ICD-10-CM | POA: Insufficient documentation

## 2016-11-13 DIAGNOSIS — Z9119 Patient's noncompliance with other medical treatment and regimen: Secondary | ICD-10-CM | POA: Insufficient documentation

## 2016-11-13 DIAGNOSIS — Z23 Encounter for immunization: Secondary | ICD-10-CM

## 2016-11-13 DIAGNOSIS — K219 Gastro-esophageal reflux disease without esophagitis: Secondary | ICD-10-CM

## 2016-11-13 DIAGNOSIS — E7800 Pure hypercholesterolemia, unspecified: Secondary | ICD-10-CM

## 2016-11-13 DIAGNOSIS — I10 Essential (primary) hypertension: Secondary | ICD-10-CM

## 2016-11-13 DIAGNOSIS — Z7984 Long term (current) use of oral hypoglycemic drugs: Secondary | ICD-10-CM | POA: Insufficient documentation

## 2016-11-13 DIAGNOSIS — Z7982 Long term (current) use of aspirin: Secondary | ICD-10-CM | POA: Insufficient documentation

## 2016-11-13 DIAGNOSIS — R200 Anesthesia of skin: Secondary | ICD-10-CM | POA: Insufficient documentation

## 2016-11-13 DIAGNOSIS — Z1159 Encounter for screening for other viral diseases: Secondary | ICD-10-CM

## 2016-11-13 DIAGNOSIS — E870 Hyperosmolality and hypernatremia: Secondary | ICD-10-CM | POA: Insufficient documentation

## 2016-11-13 DIAGNOSIS — E0800 Diabetes mellitus due to underlying condition with hyperosmolarity without nonketotic hyperglycemic-hyperosmolar coma (NKHHC): Secondary | ICD-10-CM

## 2016-11-13 LAB — LIPID PANEL W/REFLEX DIRECT LDL
Cholesterol: 187 mg/dL (ref <200)
HDL: 39 mg/dL — ABNORMAL LOW (ref >50)
LDL-CHOLESTEROL: 116 mg/dL — AB
NON-HDL CHOLESTEROL (CALC): 148 mg/dL — AB (ref <130)
TRIGLYCERIDES: 207 mg/dL — AB (ref <150)
Total CHOL/HDL Ratio: 4.8 Ratio (ref <5.0)

## 2016-11-13 LAB — COMPLETE METABOLIC PANEL WITH GFR
ALT: 9 U/L (ref 6–29)
AST: 8 U/L — AB (ref 10–35)
Albumin: 3.7 g/dL (ref 3.6–5.1)
Alkaline Phosphatase: 124 U/L (ref 33–130)
BUN: 31 mg/dL — ABNORMAL HIGH (ref 7–25)
CHLORIDE: 103 mmol/L (ref 98–110)
CO2: 23 mmol/L (ref 20–31)
Calcium: 9.1 mg/dL (ref 8.6–10.4)
Creat: 1.25 mg/dL — ABNORMAL HIGH (ref 0.50–1.05)
GFR, EST AFRICAN AMERICAN: 54 mL/min — AB (ref >=60)
GFR, Est Non African American: 47 mL/min — ABNORMAL LOW (ref >=60)
GLUCOSE: 230 mg/dL — AB (ref 65–99)
Potassium: 4.6 mmol/L (ref 3.5–5.3)
SODIUM: 138 mmol/L (ref 135–146)
Total Bilirubin: 0.3 mg/dL (ref 0.2–1.2)
Total Protein: 7 g/dL (ref 6.1–8.1)

## 2016-11-13 LAB — POCT GLYCOSYLATED HEMOGLOBIN (HGB A1C): HEMOGLOBIN A1C: 10.6

## 2016-11-13 LAB — GLUCOSE, POCT (MANUAL RESULT ENTRY): POC Glucose: 244 mg/dL — AB (ref 70–99)

## 2016-11-13 MED ORDER — LISINOPRIL 40 MG PO TABS: 40.0000 mg | ORAL_TABLET | Freq: Every day | ORAL | 5 refills | Status: DC

## 2016-11-13 MED ORDER — CETIRIZINE HCL 10 MG PO TABS: 10.0000 mg | ORAL_TABLET | Freq: Every day | ORAL | 1 refills | Status: DC

## 2016-11-13 MED ORDER — GABAPENTIN 300 MG PO CAPS: 300.0000 mg | ORAL_CAPSULE | Freq: Two times a day (BID) | ORAL | 5 refills | Status: DC

## 2016-11-13 MED ORDER — ATORVASTATIN CALCIUM 20 MG PO TABS: ORAL_TABLET | ORAL | 5 refills | Status: DC

## 2016-11-13 MED ORDER — PANTOPRAZOLE SODIUM 40 MG PO TBEC: 40.0000 mg | DELAYED_RELEASE_TABLET | Freq: Every day | ORAL | 5 refills | Status: DC

## 2016-11-13 MED ORDER — AMLODIPINE BESYLATE 10 MG PO TABS: 10.0000 mg | ORAL_TABLET | Freq: Every day | ORAL | 5 refills | Status: DC

## 2016-11-13 MED ORDER — METFORMIN HCL 500 MG PO TABS: ORAL_TABLET | ORAL | 5 refills | Status: DC

## 2016-11-13 MED ORDER — HYDROCHLOROTHIAZIDE 25 MG PO TABS: 25.0000 mg | ORAL_TABLET | Freq: Every day | ORAL | 5 refills | Status: DC

## 2016-11-13 MED ORDER — CLONIDINE HCL 0.1 MG PO TABS
0.1000 mg | ORAL_TABLET | Freq: Once | ORAL | Status: AC
  Administered 2016-11-13: 0.1 mg via ORAL

## 2016-11-13 MED ORDER — OLOPATADINE HCL 0.2 % OP SOLN: 1.0000 [drp] | Freq: Every day | OPHTHALMIC | 1 refills | Status: DC

## 2016-11-13 MED ORDER — GLIPIZIDE 5 MG PO TABS: 5.0000 mg | ORAL_TABLET | Freq: Two times a day (BID) | ORAL | 5 refills | Status: DC

## 2016-11-13 MED FILL — GABAPENTIN 300 MG CAPSULE: 300 | 30 days supply | Qty: 60 | Fill #0

## 2016-11-13 MED FILL — AMLODIPINE BESYLATE 10 MG T: 10 | 30 days supply | Qty: 30 | Fill #0

## 2016-11-13 MED FILL — ?PANTOPRAZOLE SOD DR 40MG: 40 MG | 30 days supply | Qty: 30 | Fill #0

## 2016-11-13 MED FILL — HYDROCHLOROTHIAZIDE 25 MG T: 25 | 30 days supply | Qty: 30 | Fill #0

## 2016-11-13 MED FILL — glipiZIDE 5 MG TABS: 5 | 30 days supply | Qty: 60 | Fill #0

## 2016-11-13 MED FILL — ATORVASTATIN 20 MG TABLET: 20 | 30 days supply | Qty: 30 | Fill #0

## 2016-11-13 MED FILL — LISINOPRIL 40 MG TABLET: 40 | 30 days supply | Qty: 30 | Fill #0

## 2016-11-13 MED FILL — ?METFORMIN HCL 500MG TABLET: 500 | 30 days supply | Qty: 120 | Fill #0

## 2016-11-13 MED FILL — PATADAY 0.2% EYE DROPS: 0.2 | 30 days supply | Qty: 3 | Fill #0

## 2016-11-13 NOTE — Progress Notes (Signed)
Subjective:  Patient ID: Tina Gomez, female    DOB: [birth year]  Age: 61 y.o. MRN: [id number]  CC: Diabetes and Hypertension   HPI Tina Gomez is a 61 year old female with history of hypertension, hyperlipidemia, type 2 diabetes mellitus (A1c 10.6 from today), GERD who comes into the clinic to establish care with me.  She has been out of her medications hence elevated blood pressure. Admits to some dietary indiscretions and has been drinking a lot of soda; she has not been compliant checking her blood sugars. Endorses numbness in her extremities but denies hypoglycemia.  She complains of tearing of her eyes and some rhinorrhea but denies visual disturbance..  Reflux is controlled on current medication. She would like to receive his flu shot today.  Denies chest pains or shortness of breath and has no other complaints today.  Past Medical History:  Diagnosis Date  . Hypertension   . Stroke [facility])     Past Surgical History:  Procedure Laterality Date  . CESAREAN SECTION      Allergies  Allergen Reactions  . Codeine Nausea And Vomiting     Outpatient Medications Prior to Visit  Medication Sig Dispense Refill  . aspirin 325 MG tablet Take 1 tablet (325 mg total) by mouth daily. 30 tablet 7  . Blood Glucose Monitoring Suppl (BLOOD GLUCOSE METER) kit Use as instructed 1 each 0  . glucose blood (TRUETEST TEST) test strip Use as instructed 100 each 12  . Lancets (FREESTYLE) lancets Use as instructed 100 each 12  . triamcinolone cream (KENALOG) 0.1 % Apply 1 application topically 2 (two) times daily. 30 g 0  . atorvastatin (LIPITOR) 20 MG tablet TAKE 1 TABLET BY MOUTH DAILY AT 6 PM. 30 tablet 3  . pantoprazole (PROTONIX) 40 MG tablet TAKE 1 TABLET BY MOUTH DAILY. 30 tablet 3  . amLODipine (NORVASC) 10 MG tablet TAKE 1 TABLET BY MOUTH DAILY. (Patient not taking: Reported on 11/13/2016) 30 tablet 0  . hydrochlorothiazide (HYDRODIURIL) 25 MG tablet Take 1 tablet (25 mg total)  by mouth daily. (Patient not taking: Reported on 11/13/2016) 90 tablet 3  . lisinopril (PRINIVIL,ZESTRIL) 40 MG tablet TAKE 1 TABLET BY MOUTH DAILY. (Patient not taking: Reported on 11/13/2016) 30 tablet 0  . metFORMIN (GLUCOPHAGE) 500 MG tablet TAKE 2 TABLETS BY MOUTH 2 TIMES DAILY WITH A MEAL. (Patient not taking: Reported on 11/13/2016) 120 tablet 0   No facility-administered medications prior to visit.     ROS Review of Systems  Constitutional: Negative for activity change, appetite change and fatigue.  HENT: Negative for congestion, sinus pressure and sore throat.   Eyes: Negative for visual disturbance.  Respiratory: Negative for cough, chest tightness, shortness of breath and wheezing.   Cardiovascular: Negative for chest pain and palpitations.  Gastrointestinal: Negative for abdominal distention, abdominal pain and constipation.  Endocrine: Negative for polydipsia.  Genitourinary: Negative for dysuria and frequency.  Musculoskeletal: Negative for arthralgias and back pain.  Skin: Negative for rash.  Neurological: Negative for tremors, light-headedness and numbness.  Hematological: Does not bruise/bleed easily.  Psychiatric/Behavioral: Negative for agitation and behavioral problems.    Objective:  BP (!) 181/77 (BP Location: Right Arm, Patient Position: Sitting, Cuff Size: Small)   Pulse 71   Temp 97.5 F (36.4 C) (Oral)   Ht 5' 2"  (1.575 m)   Wt 213 lb 6.4 oz (96.8 kg)   SpO2 95%   BMI 39.03 kg/m   BP/Weight 11/13/2016 11/12/4654 04/09/2750  Systolic BP 700  419 622  Diastolic BP 77 82 70  Wt. (Lbs) 213.4 213.8 207  BMI 39.03 39.09 36.68      Physical Exam  Constitutional: She is oriented to person, place, and time. She appears well-developed and well-nourished.  Cardiovascular: Normal rate, normal heart sounds and intact distal pulses.   No murmur heard. Pulmonary/Chest: Effort normal and breath sounds normal. She has no wheezes. She has no rales. She exhibits no  tenderness.  Abdominal: Soft. Bowel sounds are normal. She exhibits no distension and no mass. There is no tenderness.  Musculoskeletal: Normal range of motion.  Neurological: She is alert and oriented to person, place, and time.  Skin: Skin is warm and dry.  Psychiatric: She has a normal mood and affect.     Lab Results  Component Value Date   HGBA1C 10.6 11/13/2016    CMP Latest Ref Rng & Units 11/13/2016 02/15/2016 09/06/2014  Glucose 65 - 99 mg/dL 230(H) 232(H) 148(H)  BUN 7 - 25 mg/dL 31(H) 28(H) 38(H)  Creatinine 0.50 - 1.05 mg/dL 1.25(H) 1.21(H) 1.33(H)  Sodium 135 - 146 mmol/L 138 135 138  Potassium 3.5 - 5.3 mmol/L 4.6 4.3 4.9  Chloride 98 - 110 mmol/L 103 101 104  CO2 20 - 31 mmol/L 23 26 26   Calcium 8.6 - 10.4 mg/dL 9.1 8.8 9.2  Total Protein 6.1 - 8.1 g/dL 7.0 7.1 7.0  Total Bilirubin 0.2 - 1.2 mg/dL 0.3 0.3 0.3  Alkaline Phos 33 - 130 U/L 124 133(H) 92  AST 10 - 35 U/L 8(L) 10 10  ALT 6 - 29 U/L 9 9 8      Assessment & Plan:   1. Diabetes mellitus due to underlying condition with hyperosmolarity without coma, without long-term current use of insulin (HCC) Uncontrolled with A1c of 10.6 Glipizide added to her regimen Diabetic neuropathy- Commencing gabapentin - Glucose (CBG) - HgB A1c - metFORMIN (GLUCOPHAGE) 500 MG tablet; TAKE 2 TABLETS BY MOUTH 2 TIMES DAILY WITH A MEAL.  Dispense: 120 tablet; Refill: 5 - gabapentin (NEURONTIN) 300 MG capsule; Take 1 capsule (300 mg total) by mouth 2 (two) times daily.  Dispense: 60 capsule; Refill: 5 - glipiZIDE (GLUCOTROL) 5 MG tablet; Take 1 tablet (5 mg total) by mouth 2 (two) times daily before a meal.  Dispense: 60 tablet; Refill: 5 - Ambulatory referral to Podiatry - COMPLETE METABOLIC PANEL WITH GFR - Lipid Panel w/reflex Direct LDL - Microalbumin / creatinine urine ratio  2. Essential hypertension, benign Uncontrolled due to running out of medications Clonidine 0.1 administered - lisinopril (PRINIVIL,ZESTRIL) 40 MG  tablet; Take 1 tablet (40 mg total) by mouth daily.  Dispense: 30 tablet; Refill: 5 - amLODipine (NORVASC) 10 MG tablet; Take 1 tablet (10 mg total) by mouth daily.  Dispense: 30 tablet; Refill: 5 - hydrochlorothiazide (HYDRODIURIL) 25 MG tablet; Take 1 tablet (25 mg total) by mouth daily.  Dispense: 30 tablet; Refill: 5  3. Pure hypercholesterolemia Low cholesterol diet We'll send a lipid panel Continue statin - atorvastatin (LIPITOR) 20 MG tablet; TAKE 1 TABLET BY MOUTH DAILY AT 6 PM.  Dispense: 30 tablet; Refill: 5  4. Gastroesophageal reflux disease without esophagitis Stable - pantoprazole (PROTONIX) 40 MG tablet; Take 1 tablet (40 mg total) by mouth daily.  Dispense: 30 tablet; Refill: 5  5. Corn Refer to podiatry  6. Need for hepatitis C screening test - Hepatitis C antibody, reflex  7. 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  Dispense: 2.5 mL; Refill: 1 - cetirizine (ZYRTEC) 10 MG tablet; Take 1 tablet (10 mg total) by mouth daily.  Dispense: 30 tablet; Refill: 1   Meds ordered this encounter  Medications  . lisinopril (PRINIVIL,ZESTRIL) 40 MG tablet    Sig: Take 1 tablet (40 mg total) by mouth daily.    Dispense:  30 tablet    Refill:  5  . atorvastatin (LIPITOR) 20 MG tablet    Sig: TAKE 1 TABLET BY MOUTH DAILY AT 6 PM.    Dispense:  30 tablet    Refill:  5  . metFORMIN (GLUCOPHAGE) 500 MG tablet    Sig: TAKE 2 TABLETS BY MOUTH 2 TIMES DAILY WITH A MEAL.    Dispense:  120 tablet    Refill:  5    Must have office visit for refills  . pantoprazole (PROTONIX) 40 MG tablet    Sig: Take 1 tablet (40 mg total) by mouth daily.    Dispense:  30 tablet    Refill:  5  . amLODipine (NORVASC) 10 MG tablet    Sig: Take 1 tablet (10 mg total) by mouth daily.    Dispense:  30 tablet    Refill:  5  . hydrochlorothiazide (HYDRODIURIL) 25 MG tablet    Sig: Take 1 tablet (25 mg total) by mouth  daily.    Dispense:  30 tablet    Refill:  5  . Olopatadine HCl (PATADAY) 0.2 % SOLN    Sig: Apply 1 drop to eye daily.    Dispense:  2.5 mL    Refill:  1  . cetirizine (ZYRTEC) 10 MG tablet    Sig: Take 1 tablet (10 mg total) by mouth daily.    Dispense:  30 tablet    Refill:  1  . gabapentin (NEURONTIN) 300 MG capsule    Sig: Take 1 capsule (300 mg total) by mouth 2 (two) times daily.    Dispense:  60 capsule    Refill:  5  . glipiZIDE (GLUCOTROL) 5 MG tablet    Sig: Take 1 tablet (5 mg total) by mouth 2 (two) times daily before a meal.    Dispense:  60 tablet    Refill:  5    Follow-up: Return in about 3 weeks (around 12/04/2016) for Follow-up on hypertension and diabetes.   Arnoldo Morale MD

## 2016-11-13 NOTE — Progress Notes (Signed)
Med refills- everything Has been out of BP meds for 6 days and metformin also

## 2016-11-14 LAB — MICROALBUMIN / CREATININE URINE RATIO
CREATININE, URINE: 69 mg/dL (ref 20–320)
MICROALB/CREAT RATIO: 397 ug/mg{creat} — AB (ref <30)
Microalb, Ur: 27.4 mg/dL

## 2016-11-14 LAB — HEPATITIS C ANTIBODY: HCV AB: NEGATIVE

## 2016-11-15 ENCOUNTER — Telehealth: Payer: Self-pay

## 2016-11-15 NOTE — Telephone Encounter (Signed)
Writer spoke with patient this morning and discussed her lab results.  Patient stated understandng.

## 2016-11-15 NOTE — Telephone Encounter (Signed)
-----   Message from Arnoldo Morale, MD sent at 11/14/2016 12:37 PM EST ----- Cholesterol has trended up slightly from last office visit; advised to work on weight loss, low cholesterol diet and if levels remain uncontrolled we will need to increase the dose of atorvastatin.

## 2016-12-18 MED FILL — glipiZIDE 5 MG TABS: 5 | 30 days supply | Qty: 60 | Fill #1

## 2016-12-18 MED FILL — metFORMIN HCL 500 MG TABS: 500 | 30 days supply | Qty: 120 | Fill #1

## 2016-12-18 MED FILL — HYDROCHLOROTHIAZIDE 25 MG T: 25 | 30 days supply | Qty: 30 | Fill #1

## 2016-12-18 MED FILL — PANTOPRAZOLE SOD DR 40 MG T: 40 | 30 days supply | Qty: 30 | Fill #1

## 2016-12-18 MED FILL — GABAPENTIN 300 MG CAPSULE: 300 | 30 days supply | Qty: 60 | Fill #1

## 2016-12-18 MED FILL — AMLODIPINE BESYLATE 10 MG T: 10 | 30 days supply | Qty: 30 | Fill #1

## 2016-12-18 MED FILL — PATADAY 0.2% EYE DROPS: 0.2 | 30 days supply | Qty: 3 | Fill #1

## 2016-12-18 MED FILL — ?ATORVASTATIN 20 MG TABLET: 20 | 30 days supply | Qty: 30 | Fill #1

## 2016-12-18 MED FILL — LISINOPRIL 40 MG TABLET: 40 | 30 days supply | Qty: 30 | Fill #1

## 2017-03-25 MED FILL — HYDROCHLOROTHIAZIDE 25 MG T: 25 | 30 days supply | Qty: 30 | Fill #2

## 2017-03-25 MED FILL — LISINOPRIL 40 MG TABLET: 40 | 30 days supply | Qty: 30 | Fill #2

## 2017-03-25 MED FILL — ATORVASTATIN 20 MG TABLET: 20 | 30 days supply | Qty: 30 | Fill #2

## 2017-03-25 MED FILL — glipiZIDE 5 MG TABS: 5 | 30 days supply | Qty: 60 | Fill #2

## 2017-03-25 MED FILL — ?PANTOPRAZOLE SOD DR 40MG: 40 MG | 30 days supply | Qty: 30 | Fill #2

## 2017-03-25 MED FILL — ?AMLODIPINE BESYLATE 10 MG: 10 | 30 days supply | Qty: 30 | Fill #2

## 2017-05-16 MED FILL — LISINOPRIL 40 MG TABLET: 40 | 30 days supply | Qty: 30 | Fill #3

## 2017-05-16 MED FILL — AMLODIPINE BESYLATE 10 MG T: 10 | 30 days supply | Qty: 30 | Fill #3

## 2017-07-22 MED FILL — ?ATORVASTATIN 20 MG TABLET: 20 | 30 days supply | Qty: 30 | Fill #3

## 2017-07-22 MED FILL — LISINOPRIL 40 MG TABS: 40 | 30 days supply | Qty: 30 | Fill #4

## 2017-07-22 MED FILL — ?GLIPIZIDE 5MG TABLET: 5 | 30 days supply | Qty: 60 | Fill #3

## 2017-07-22 MED FILL — ?PANTOPRAZOLE SOD DR 40MG: 40 MG | 30 days supply | Qty: 30 | Fill #3

## 2017-07-22 MED FILL — AMLODIPINE BESYLATE 10 MG T: 10 | 30 days supply | Qty: 30 | Fill #4

## 2017-09-24 MED FILL — ?ATORVASTATIN 20MG TABLET: 20 | 30 days supply | Qty: 30 | Fill #4

## 2017-09-24 MED FILL — HYDROCHLOROTHIAZIDE 25 MG T: 25 | 30 days supply | Qty: 30 | Fill #3

## 2017-09-24 MED FILL — LISINOPRIL 40 MG TAB: 40 | 30 days supply | Qty: 30 | Fill #5

## 2017-09-24 MED FILL — ?GLIPIZIDE 5MG TABLET: 5 | 30 days supply | Qty: 60 | Fill #4

## 2017-09-24 MED FILL — AMLODIPINE BESYLATE 10 MG T: 10 | 30 days supply | Qty: 30 | Fill #5

## 2017-09-24 MED FILL — ?PANTOPRAZOLE SOD DR 40MG: 40 MG | 30 days supply | Qty: 30 | Fill #4

## 2017-10-20 ENCOUNTER — Ambulatory Visit: Payer: Self-pay | Attending: Family Medicine

## 2017-10-31 ENCOUNTER — Other Ambulatory Visit: Payer: Self-pay | Admitting: Family Medicine

## 2017-10-31 DIAGNOSIS — I10 Essential (primary) hypertension: Secondary | ICD-10-CM

## 2017-10-31 MED FILL — metFORMIN HCL 500 MG TABS: 500 | 30 days supply | Qty: 120 | Fill #2

## 2017-10-31 MED FILL — ATORVASTATIN 20 MG TABLET: 20 | 30 days supply | Qty: 30 | Fill #5

## 2017-10-31 MED FILL — ?PANTOPRAZOLE SOD DR 40MG T: 40 | 30 days supply | Qty: 30 | Fill #5

## 2017-10-31 MED FILL — HYDROCHLOROTHIAZIDE 25 MG T: 25 | 30 days supply | Qty: 30 | Fill #4

## 2017-11-03 ENCOUNTER — Ambulatory Visit: Payer: Self-pay | Attending: Family Medicine | Admitting: Family Medicine

## 2017-11-03 ENCOUNTER — Encounter: Payer: Self-pay | Admitting: Family Medicine

## 2017-11-03 VITALS — BP 200/74 | HR 69 | Temp 97.9°F | Ht 62.0 in | Wt 218.0 lb

## 2017-11-03 DIAGNOSIS — I10 Essential (primary) hypertension: Secondary | ICD-10-CM | POA: Insufficient documentation

## 2017-11-03 DIAGNOSIS — Z7982 Long term (current) use of aspirin: Secondary | ICD-10-CM | POA: Insufficient documentation

## 2017-11-03 DIAGNOSIS — E1100 Type 2 diabetes mellitus with hyperosmolarity without nonketotic hyperglycemic-hyperosmolar coma (NKHHC): Secondary | ICD-10-CM | POA: Insufficient documentation

## 2017-11-03 DIAGNOSIS — R42 Dizziness and giddiness: Secondary | ICD-10-CM | POA: Insufficient documentation

## 2017-11-03 DIAGNOSIS — K219 Gastro-esophageal reflux disease without esophagitis: Secondary | ICD-10-CM | POA: Insufficient documentation

## 2017-11-03 DIAGNOSIS — E7800 Pure hypercholesterolemia, unspecified: Secondary | ICD-10-CM | POA: Insufficient documentation

## 2017-11-03 DIAGNOSIS — E1140 Type 2 diabetes mellitus with diabetic neuropathy, unspecified: Secondary | ICD-10-CM | POA: Insufficient documentation

## 2017-11-03 DIAGNOSIS — Z8673 Personal history of transient ischemic attack (TIA), and cerebral infarction without residual deficits: Secondary | ICD-10-CM | POA: Insufficient documentation

## 2017-11-03 DIAGNOSIS — B373 Candidiasis of vulva and vagina: Secondary | ICD-10-CM | POA: Insufficient documentation

## 2017-11-03 DIAGNOSIS — Z9114 Patient's other noncompliance with medication regimen: Secondary | ICD-10-CM | POA: Insufficient documentation

## 2017-11-03 DIAGNOSIS — B3731 Acute candidiasis of vulva and vagina: Secondary | ICD-10-CM

## 2017-11-03 DIAGNOSIS — Z7984 Long term (current) use of oral hypoglycemic drugs: Secondary | ICD-10-CM | POA: Insufficient documentation

## 2017-11-03 DIAGNOSIS — Z79899 Other long term (current) drug therapy: Secondary | ICD-10-CM | POA: Insufficient documentation

## 2017-11-03 DIAGNOSIS — Z885 Allergy status to narcotic agent status: Secondary | ICD-10-CM | POA: Insufficient documentation

## 2017-11-03 LAB — GLUCOSE, POCT (MANUAL RESULT ENTRY): POC Glucose: 227 mg/dl — AB (ref 70–99)

## 2017-11-03 LAB — POCT GLYCOSYLATED HEMOGLOBIN (HGB A1C): Hemoglobin A1C: 9.7

## 2017-11-03 MED ORDER — METFORMIN HCL 500 MG PO TABS: ORAL_TABLET | ORAL | 5 refills | Status: DC

## 2017-11-03 MED ORDER — GLIPIZIDE 10 MG PO TABS
10.0000 mg | ORAL_TABLET | Freq: Two times a day (BID) | ORAL | 5 refills | Status: DC
Start: 2017-11-03 — End: 2018-03-10

## 2017-11-03 MED ORDER — FLUCONAZOLE 150 MG PO TABS: 150.0000 mg | ORAL_TABLET | Freq: Once | ORAL | 0 refills | Status: AC

## 2017-11-03 MED ORDER — AMLODIPINE BESYLATE 10 MG PO TABS
10.0000 mg | ORAL_TABLET | Freq: Every day | ORAL | 5 refills | Status: DC
Start: 2017-11-03 — End: 2018-03-10

## 2017-11-03 MED ORDER — PANTOPRAZOLE SODIUM 40 MG PO TBEC: 40.0000 mg | DELAYED_RELEASE_TABLET | Freq: Every day | ORAL | 5 refills | Status: DC

## 2017-11-03 MED ORDER — ATORVASTATIN CALCIUM 20 MG PO TABS: ORAL_TABLET | ORAL | 5 refills | Status: DC

## 2017-11-03 MED ORDER — LISINOPRIL 40 MG PO TABS: 40.0000 mg | ORAL_TABLET | Freq: Every day | ORAL | 5 refills | Status: DC

## 2017-11-03 MED ORDER — HYDROCHLOROTHIAZIDE 25 MG PO TABS: 25.0000 mg | ORAL_TABLET | Freq: Every day | ORAL | 5 refills | Status: DC

## 2017-11-03 MED FILL — glipiZIDE 10 MG TABS: 10 | 30 days supply | Qty: 60 | Fill #0

## 2017-11-03 MED FILL — FLUCONAZOLE 150 MG TABLET: 150 | 1 days supply | Qty: 1 | Fill #0

## 2017-11-03 MED FILL — LISINOPRIL 40 MG TAB: 40 | 30 days supply | Qty: 30 | Fill #0

## 2017-11-03 MED FILL — AMLODIPINE BESYLATE 10 MG T: 10 | 30 days supply | Qty: 30 | Fill #0

## 2017-11-03 NOTE — Progress Notes (Signed)
Subjective:  Patient ID: Tina Gomez, female    DOB: 04-06-1956  Age: 62 y.o. MRN: [id number]  CC: Hypertension; Medication Refill; and Diabetes   HPI Tina Gomez is a 62 year old female with history of hypertension, hyperlipidemia, type 2 diabetes mellitus (A1c 9.7 from today), GERD who comes into the clinic for follow-up visit.  She has been out of her medication for 5 days but up until then had been compliant with her medications. She has not been checking her blood sugars regularly and denies hypoglycemia, blurry vision but does have neuropathy in her extremities.  States she has been unable to tolerate gabapentin because it makes her feel dizzy. Admits to noncompliance with a diabetic diet and lifestyle modifications.  Her blood pressure is severely elevated as a result of being out of her antihypertensives Her reflux symptoms  are controlled on her PPI. She does not exercise regularly.   Past Medical History:  Diagnosis Date  . Hypertension   . Stroke [facility])     Past Surgical History:  Procedure Laterality Date  . CESAREAN SECTION      Allergies  Allergen Reactions  . Codeine Nausea And Vomiting     Outpatient Medications Prior to Visit  Medication Sig Dispense Refill  . aspirin 325 MG tablet Take 1 tablet (325 mg total) by mouth daily. 30 tablet 7  . Blood Glucose Monitoring Suppl (BLOOD GLUCOSE METER) kit Use as instructed 1 each 0  . cetirizine (ZYRTEC) 10 MG tablet Take 1 tablet (10 mg total) by mouth daily. 30 tablet 1  . glucose blood (TRUETEST TEST) test strip Use as instructed 100 each 12  . Lancets (FREESTYLE) lancets Use as instructed 100 each 12  . Olopatadine HCl (PATADAY) 0.2 % SOLN Apply 1 drop to eye daily. 2.5 mL 1  . triamcinolone cream (KENALOG) 0.1 % Apply 1 application topically 2 (two) times daily. 30 g 0  . amLODipine (NORVASC) 10 MG tablet Take 1 tablet (10 mg total) by mouth daily. 30 tablet 5  . atorvastatin (LIPITOR) 20 MG tablet  TAKE 1 TABLET BY MOUTH DAILY AT 6 PM. 30 tablet 5  . gabapentin (NEURONTIN) 300 MG capsule Take 1 capsule (300 mg total) by mouth 2 (two) times daily. 60 capsule 5  . glipiZIDE (GLUCOTROL) 5 MG tablet Take 1 tablet (5 mg total) by mouth 2 (two) times daily before a meal. 60 tablet 5  . hydrochlorothiazide (HYDRODIURIL) 25 MG tablet Take 1 tablet (25 mg total) by mouth daily. 30 tablet 5  . lisinopril (PRINIVIL,ZESTRIL) 40 MG tablet Take 1 tablet (40 mg total) by mouth daily. 30 tablet 5  . metFORMIN (GLUCOPHAGE) 500 MG tablet TAKE 2 TABLETS BY MOUTH 2 TIMES DAILY WITH A MEAL. 120 tablet 5  . pantoprazole (PROTONIX) 40 MG tablet Take 1 tablet (40 mg total) by mouth daily. 30 tablet 5   No facility-administered medications prior to visit.     ROS Review of Systems  Constitutional: Negative for activity change, appetite change and fatigue.  HENT: Negative for congestion, sinus pressure and sore throat.   Eyes: Negative for visual disturbance.  Respiratory: Negative for cough, chest tightness, shortness of breath and wheezing.   Cardiovascular: Negative for chest pain and palpitations.  Gastrointestinal: Negative for abdominal distention, abdominal pain and constipation.  Endocrine: Negative for polydipsia.  Genitourinary: Negative for dysuria and frequency.  Musculoskeletal: Negative for arthralgias and back pain.  Skin: Negative for rash.  Neurological: Positive for numbness. Negative for tremors  and light-headedness.  Hematological: Does not bruise/bleed easily.  Psychiatric/Behavioral: Negative for agitation and behavioral problems.    Objective:  BP (!) 200/74   Pulse 69   Temp 97.9 F (36.6 C) (Oral)   Ht 5' 2" (1.575 m)   Wt 218 lb (98.9 kg)   SpO2 95%   BMI 39.87 kg/m   BP/Weight 11/03/2017 11/13/2016 02/14/2016  Systolic BP 200 181 163  Diastolic BP 74 77 82  Wt. (Lbs) 218 213.4 213.8  BMI 39.87 39.03 39.09      Physical Exam  Constitutional: She is oriented to  person, place, and time. She appears well-developed and well-nourished.  Cardiovascular: Normal rate, normal heart sounds and intact distal pulses.  No murmur heard. Pulmonary/Chest: Effort normal and breath sounds normal. She has no wheezes. She has no rales. She exhibits no tenderness.  Abdominal: Soft. Bowel sounds are normal. She exhibits no distension and no mass. There is no tenderness.  Musculoskeletal: Normal range of motion.  Neurological: She is alert and oriented to person, place, and time.  Skin: Skin is warm and dry.  Psychiatric: She has a normal mood and affect.     CMP Latest Ref Rng & Units 11/13/2016 02/15/2016 09/06/2014  Glucose 65 - 99 mg/dL 230(H) 232(H) 148(H)  BUN 7 - 25 mg/dL 31(H) 28(H) 38(H)  Creatinine 0.50 - 1.05 mg/dL 1.25(H) 1.21(H) 1.33(H)  Sodium 135 - 146 mmol/L 138 135 138  Potassium 3.5 - 5.3 mmol/L 4.6 4.3 4.9  Chloride 98 - 110 mmol/L 103 101 104  CO2 20 - 31 mmol/L 23 26 26  Calcium 8.6 - 10.4 mg/dL 9.1 8.8 9.2  Total Protein 6.1 - 8.1 g/dL 7.0 7.1 7.0  Total Bilirubin 0.2 - 1.2 mg/dL 0.3 0.3 0.3  Alkaline Phos 33 - 130 U/L 124 133(H) 92  AST 10 - 35 U/L 8(L) 10 10  ALT 6 - 29 U/L 9 9 8    Lipid Panel     Component Value Date/Time   CHOL 187 11/13/2016 1018   TRIG 207 (H) 11/13/2016 1018   HDL 39 (L) 11/13/2016 1018   CHOLHDL 4.8 11/13/2016 1018   VLDL 40 (H) 02/15/2016 0907   LDLCALC 96 02/15/2016 0907     Lab Results  Component Value Date   HGBA1C 9.7 11/03/2017    Assessment & Plan:   1. Type 2 diabetes mellitus with diabetic neuropathy, without long-term current use of insulin (HCC) Uncontrolled with A1c of 9.7 She has been out of medications for few days but this will not be sufficient to explain uncontrolled A1c of 9.7 Increase dose of glipizide Discontinue gabapentin due to complaints of dizziness Counseled on Diabetic diet, my plate method, 150 minutes of moderate intensity exercise/week Keep blood sugar logs with fasting  goals of 80-120 mg/dl, random of less than 180 and in the event of sugars less than 60 mg/dl or greater than 400 mg/dl please notify the clinic ASAP. It is recommended that you undergo annual eye exams and annual foot exams. Pneumonia vaccine is recommended. - POCT glucose (manual entry) - POCT glycosylated hemoglobin (Hb A1C) - CMP14+EGFR; Future - Lipid panel; Future - Microalbumin/Creatinine Ratio, Urine; Future  2. Type 2 diabetes mellitus with hyperosmolarity without coma, without long-term current use of insulin (HCC) - glipiZIDE (GLUCOTROL) 10 MG tablet; Take 1 tablet (10 mg total) by mouth 2 (two) times daily before a meal.  Dispense: 60 tablet; Refill: 5 - metFORMIN (GLUCOPHAGE) 500 MG tablet; TAKE 2 TABLETS BY MOUTH   2 TIMES DAILY WITH A MEAL.  Dispense: 120 tablet; Refill: 5  3. Essential hypertension, benign Uncontrolled due to running out of medications Unable to give clonidine due to low heart rate - amLODipine (NORVASC) 10 MG tablet; Take 1 tablet (10 mg total) by mouth daily.  Dispense: 30 tablet; Refill: 5 - hydrochlorothiazide (HYDRODIURIL) 25 MG tablet; Take 1 tablet (25 mg total) by mouth daily.  Dispense: 30 tablet; Refill: 5 - lisinopril (PRINIVIL,ZESTRIL) 40 MG tablet; Take 1 tablet (40 mg total) by mouth daily.  Dispense: 30 tablet; Refill: 5  4. Pure hypercholesterolemia Controlled Low-cholesterol diet - atorvastatin (LIPITOR) 20 MG tablet; TAKE 1 TABLET BY MOUTH DAILY AT 6 PM.  Dispense: 30 tablet; Refill: 5  5. Gastroesophageal reflux disease without esophagitis Stable - pantoprazole (PROTONIX) 40 MG tablet; Take 1 tablet (40 mg total) by mouth daily.  Dispense: 30 tablet; Refill: 5  6. Vaginal candidiasis Due to hyperglycemia she is at risk of vaginal candidiasis - fluconazole (DIFLUCAN) 150 MG tablet; Take 1 tablet (150 mg total) by mouth once for 1 dose.  Dispense: 1 tablet; Refill: 0   Meds ordered this encounter  Medications  . fluconazole (DIFLUCAN)  150 MG tablet    Sig: Take 1 tablet (150 mg total) by mouth once for 1 dose.    Dispense:  1 tablet    Refill:  0  . glipiZIDE (GLUCOTROL) 10 MG tablet    Sig: Take 1 tablet (10 mg total) by mouth 2 (two) times daily before a meal.    Dispense:  60 tablet    Refill:  5  . amLODipine (NORVASC) 10 MG tablet    Sig: Take 1 tablet (10 mg total) by mouth daily.    Dispense:  30 tablet    Refill:  5  . atorvastatin (LIPITOR) 20 MG tablet    Sig: TAKE 1 TABLET BY MOUTH DAILY AT 6 PM.    Dispense:  30 tablet    Refill:  5  . hydrochlorothiazide (HYDRODIURIL) 25 MG tablet    Sig: Take 1 tablet (25 mg total) by mouth daily.    Dispense:  30 tablet    Refill:  5  . lisinopril (PRINIVIL,ZESTRIL) 40 MG tablet    Sig: Take 1 tablet (40 mg total) by mouth daily.    Dispense:  30 tablet    Refill:  5  . metFORMIN (GLUCOPHAGE) 500 MG tablet    Sig: TAKE 2 TABLETS BY MOUTH 2 TIMES DAILY WITH A MEAL.    Dispense:  120 tablet    Refill:  5  . pantoprazole (PROTONIX) 40 MG tablet    Sig: Take 1 tablet (40 mg total) by mouth daily.    Dispense:  30 tablet    Refill:  5    Follow-up: Return in about 1 month (around 12/01/2017) for complete physical exam.   Charlott Rakes MD

## 2017-11-03 NOTE — Patient Instructions (Signed)
Diabetes Mellitus and Nutrition When you have diabetes (diabetes mellitus), it is very important to have healthy eating habits because your blood sugar (glucose) levels are greatly affected by what you eat and drink. Eating healthy foods in the appropriate amounts, at about the same times every day, can help you:  Control your blood glucose.  Lower your risk of heart disease.  Improve your blood pressure.  Reach or maintain a healthy weight.  Every person with diabetes is different, and each person has different needs for a meal plan. Your health care provider may recommend that you work with a diet and nutrition specialist (dietitian) to make a meal plan that is best for you. Your meal plan may vary depending on factors such as:  The calories you need.  The medicines you take.  Your weight.  Your blood glucose, blood pressure, and cholesterol levels.  Your activity level.  Other health conditions you have, such as heart or kidney disease.  How do carbohydrates affect me? Carbohydrates affect your blood glucose level more than any other type of food. Eating carbohydrates naturally increases the amount of glucose in your blood. Carbohydrate counting is a method for keeping track of how many carbohydrates you eat. Counting carbohydrates is important to keep your blood glucose at a healthy level, especially if you use insulin or take certain oral diabetes medicines. It is important to know how many carbohydrates you can safely have in each meal. This is different for every person. Your dietitian can help you calculate how many carbohydrates you should have at each meal and for snack. Foods that contain carbohydrates include:  Bread, cereal, rice, pasta, and crackers.  Potatoes and corn.  Peas, beans, and lentils.  Milk and yogurt.  Fruit and juice.  Desserts, such as cakes, cookies, ice cream, and candy.  How does alcohol affect me? Alcohol can cause a sudden decrease in blood  glucose (hypoglycemia), especially if you use insulin or take certain oral diabetes medicines. Hypoglycemia can be a life-threatening condition. Symptoms of hypoglycemia (sleepiness, dizziness, and confusion) are similar to symptoms of having too much alcohol. If your health care provider says that alcohol is safe for you, follow these guidelines:  Limit alcohol intake to no more than 1 drink per day for nonpregnant women and 2 drinks per day for men. One drink equals 12 oz of beer, 5 oz of wine, or 1 oz of hard liquor.  Do not drink on an empty stomach.  Keep yourself hydrated with water, diet soda, or unsweetened iced tea.  Keep in mind that regular soda, juice, and other mixers may contain a lot of sugar and must be counted as carbohydrates.  What are tips for following this plan? Reading food labels  Start by checking the serving size on the label. The amount of calories, carbohydrates, fats, and other nutrients listed on the label are based on one serving of the food. Many foods contain more than one serving per package.  Check the total grams (g) of carbohydrates in one serving. You can calculate the number of servings of carbohydrates in one serving by dividing the total carbohydrates by 15. For example, if a food has 30 g of total carbohydrates, it would be equal to 2 servings of carbohydrates.  Check the number of grams (g) of saturated and trans fats in one serving. Choose foods that have low or no amount of these fats.  Check the number of milligrams (mg) of sodium in one serving. Most people   should limit total sodium intake to less than 2,300 mg per day.  Always check the nutrition information of foods labeled as "low-fat" or "nonfat". These foods may be higher in added sugar or refined carbohydrates and should be avoided.  Talk to your dietitian to identify your daily goals for nutrients listed on the label. Shopping  Avoid buying canned, premade, or processed foods. These  foods tend to be high in fat, sodium, and added sugar.  Shop around the outside edge of the grocery store. This includes fresh fruits and vegetables, bulk grains, fresh meats, and fresh dairy. Cooking  Use low-heat cooking methods, such as baking, instead of high-heat cooking methods like deep frying.  Cook using healthy oils, such as olive, canola, or sunflower oil.  Avoid cooking with butter, cream, or high-fat meats. Meal planning  Eat meals and snacks regularly, preferably at the same times every day. Avoid going long periods of time without eating.  Eat foods high in fiber, such as fresh fruits, vegetables, beans, and whole grains. Talk to your dietitian about how many servings of carbohydrates you can eat at each meal.  Eat 4-6 ounces of lean protein each day, such as lean meat, chicken, fish, eggs, or tofu. 1 ounce is equal to 1 ounce of meat, chicken, or fish, 1 egg, or 1/4 cup of tofu.  Eat some foods each day that contain healthy fats, such as avocado, nuts, seeds, and fish. Lifestyle   Check your blood glucose regularly.  Exercise at least 30 minutes 5 or more days each week, or as told by your health care provider.  Take medicines as told by your health care provider.  Do not use any products that contain nicotine or tobacco, such as cigarettes and e-cigarettes. If you need help quitting, ask your health care provider.  Work with a counselor or diabetes educator to identify strategies to manage stress and any emotional and social challenges. What are some questions to ask my health care provider?  Do I need to meet with a diabetes educator?  Do I need to meet with a dietitian?  What number can I call if I have questions?  When are the best times to check my blood glucose? Where to find more information:  American Diabetes Association: diabetes.org/food-and-fitness/food  Academy of Nutrition and Dietetics:  www.eatright.org/resources/health/diseases-and-conditions/diabetes  National Institute of Diabetes and Digestive and Kidney Diseases (NIH): www.niddk.nih.gov/health-information/diabetes/overview/diet-eating-physical-activity Summary  A healthy meal plan will help you control your blood glucose and maintain a healthy lifestyle.  Working with a diet and nutrition specialist (dietitian) can help you make a meal plan that is best for you.  Keep in mind that carbohydrates and alcohol have immediate effects on your blood glucose levels. It is important to count carbohydrates and to use alcohol carefully. This information is not intended to replace advice given to you by your health care provider. Make sure you discuss any questions you have with your health care provider. Document Released: 05/23/2005 Document Revised: 09/30/2016 Document Reviewed: 09/30/2016 Elsevier Interactive Patient Education  2018 Elsevier Inc.  

## 2017-11-04 ENCOUNTER — Ambulatory Visit: Payer: Self-pay | Attending: Family Medicine

## 2017-11-04 DIAGNOSIS — E1140 Type 2 diabetes mellitus with diabetic neuropathy, unspecified: Secondary | ICD-10-CM | POA: Insufficient documentation

## 2017-11-04 NOTE — Progress Notes (Signed)
Patient here for lab visit only 

## 2017-11-05 ENCOUNTER — Other Ambulatory Visit: Payer: Self-pay | Admitting: Family Medicine

## 2017-11-05 LAB — MICROALBUMIN / CREATININE URINE RATIO
Creatinine, Urine: 85.4 mg/dL
Microalb/Creat Ratio: 2042.2 mg/g creat — ABNORMAL HIGH (ref 0.0–30.0)
Microalbumin, Urine: 1744 ug/mL

## 2017-11-05 LAB — CMP14+EGFR
A/G RATIO: 1.3 (ref 1.2–2.2)
ALK PHOS: 134 IU/L — AB (ref 39–117)
ALT: 12 IU/L (ref 0–32)
AST: 12 IU/L (ref 0–40)
Albumin: 4 g/dL (ref 3.6–4.8)
BILIRUBIN TOTAL: 0.2 mg/dL (ref 0.0–1.2)
BUN/Creatinine Ratio: 19 (ref 12–28)
BUN: 27 mg/dL (ref 8–27)
CHLORIDE: 99 mmol/L (ref 96–106)
CO2: 23 mmol/L (ref 20–29)
Calcium: 9.5 mg/dL (ref 8.7–10.3)
Creatinine, Ser: 1.4 mg/dL — ABNORMAL HIGH (ref 0.57–1.00)
GFR calc non Af Amer: 41 mL/min/{1.73_m2} — ABNORMAL LOW (ref >59)
GFR, EST AFRICAN AMERICAN: 47 mL/min/{1.73_m2} — AB (ref >59)
Globulin, Total: 3.1 g/dL (ref 1.5–4.5)
Glucose: 220 mg/dL — ABNORMAL HIGH (ref 65–99)
POTASSIUM: 4.9 mmol/L (ref 3.5–5.2)
Sodium: 140 mmol/L (ref 134–144)
Total Protein: 7.1 g/dL (ref 6.0–8.5)

## 2017-11-05 LAB — LIPID PANEL
Chol/HDL Ratio: 5.3 ratio — ABNORMAL HIGH (ref 0.0–4.4)
Cholesterol, Total: 221 mg/dL — ABNORMAL HIGH (ref 100–199)
HDL: 42 mg/dL (ref >39)
LDL CALC: 124 mg/dL — AB (ref 0–99)
TRIGLYCERIDES: 275 mg/dL — AB (ref 0–149)
VLDL CHOLESTEROL CAL: 55 mg/dL — AB (ref 5–40)

## 2017-11-06 ENCOUNTER — Other Ambulatory Visit: Payer: Self-pay | Admitting: Family Medicine

## 2017-11-06 DIAGNOSIS — E7800 Pure hypercholesterolemia, unspecified: Secondary | ICD-10-CM

## 2017-11-06 MED ORDER — ATORVASTATIN CALCIUM 40 MG PO TABS: ORAL_TABLET | ORAL | 3 refills | Status: DC

## 2017-11-07 ENCOUNTER — Telehealth: Payer: Self-pay

## 2017-11-07 NOTE — Telephone Encounter (Signed)
Patient was called and informed of lab results. 

## 2017-12-03 ENCOUNTER — Ambulatory Visit: Payer: Self-pay | Attending: Family Medicine | Admitting: Family Medicine

## 2017-12-03 ENCOUNTER — Encounter: Payer: Self-pay | Admitting: Family Medicine

## 2017-12-03 VITALS — BP 145/72 | HR 70 | Temp 97.4°F | Ht 62.0 in | Wt 219.0 lb

## 2017-12-03 DIAGNOSIS — Z1211 Encounter for screening for malignant neoplasm of colon: Secondary | ICD-10-CM

## 2017-12-03 DIAGNOSIS — A5901 Trichomonal vulvovaginitis: Secondary | ICD-10-CM | POA: Insufficient documentation

## 2017-12-03 DIAGNOSIS — B9689 Other specified bacterial agents as the cause of diseases classified elsewhere: Secondary | ICD-10-CM

## 2017-12-03 DIAGNOSIS — Z23 Encounter for immunization: Secondary | ICD-10-CM

## 2017-12-03 DIAGNOSIS — Z7982 Long term (current) use of aspirin: Secondary | ICD-10-CM | POA: Insufficient documentation

## 2017-12-03 DIAGNOSIS — Z1231 Encounter for screening mammogram for malignant neoplasm of breast: Secondary | ICD-10-CM

## 2017-12-03 DIAGNOSIS — N760 Acute vaginitis: Secondary | ICD-10-CM | POA: Insufficient documentation

## 2017-12-03 DIAGNOSIS — Z01419 Encounter for gynecological examination (general) (routine) without abnormal findings: Secondary | ICD-10-CM | POA: Insufficient documentation

## 2017-12-03 DIAGNOSIS — Z7984 Long term (current) use of oral hypoglycemic drugs: Secondary | ICD-10-CM | POA: Insufficient documentation

## 2017-12-03 DIAGNOSIS — Z9889 Other specified postprocedural states: Secondary | ICD-10-CM | POA: Insufficient documentation

## 2017-12-03 DIAGNOSIS — I10 Essential (primary) hypertension: Secondary | ICD-10-CM | POA: Insufficient documentation

## 2017-12-03 DIAGNOSIS — Z124 Encounter for screening for malignant neoplasm of cervix: Secondary | ICD-10-CM

## 2017-12-03 DIAGNOSIS — Z79899 Other long term (current) drug therapy: Secondary | ICD-10-CM | POA: Insufficient documentation

## 2017-12-03 DIAGNOSIS — Z885 Allergy status to narcotic agent status: Secondary | ICD-10-CM | POA: Insufficient documentation

## 2017-12-03 DIAGNOSIS — M7989 Other specified soft tissue disorders: Secondary | ICD-10-CM | POA: Insufficient documentation

## 2017-12-03 DIAGNOSIS — Z1239 Encounter for other screening for malignant neoplasm of breast: Secondary | ICD-10-CM

## 2017-12-03 DIAGNOSIS — E1140 Type 2 diabetes mellitus with diabetic neuropathy, unspecified: Secondary | ICD-10-CM | POA: Insufficient documentation

## 2017-12-03 DIAGNOSIS — Z8673 Personal history of transient ischemic attack (TIA), and cerebral infarction without residual deficits: Secondary | ICD-10-CM | POA: Insufficient documentation

## 2017-12-03 LAB — GLUCOSE, POCT (MANUAL RESULT ENTRY): POC GLUCOSE: 235 mg/dL — AB (ref 70–99)

## 2017-12-03 MED ORDER — METRONIDAZOLE 0.75 % VA GEL: 1.0000 | Freq: Every day | VAGINAL | 0 refills | Status: DC

## 2017-12-03 MED FILL — VANDAZOLE VAGINAL 0.75% GEL: 0.75 | 30 days supply | Qty: 70 | Fill #0

## 2017-12-03 NOTE — Progress Notes (Signed)
 Subjective:  Patient ID: Tina Gomez, female    DOB: 12/05/1955  Age: 61 y.o. MRN: [id number]  CC: Annual Exam and Gynecologic Exam   HPI Tina Gomez is a 61 year old woman who presents today for a well woman exam and has not had a Pap smear or mammogram in a while. She has noticed right leg swelling which increases as the day progresses but resolves when she wakes up in the morning; denies shortness of breath or chest pains.  Past Medical History:  Diagnosis Date  . Hypertension   . Stroke (HCC)     Past Surgical History:  Procedure Laterality Date  . CESAREAN SECTION      Allergies  Allergen Reactions  . Codeine Nausea And Vomiting     Outpatient Medications Prior to Visit  Medication Sig Dispense Refill  . amLODipine (NORVASC) 10 MG tablet Take 1 tablet (10 mg total) by mouth daily. 30 tablet 5  . aspirin 325 MG tablet Take 1 tablet (325 mg total) by mouth daily. 30 tablet 7  . atorvastatin (LIPITOR) 40 MG tablet TAKE 1 TABLET BY MOUTH DAILY AT 6 PM. 30 tablet 3  . Blood Glucose Monitoring Suppl (BLOOD GLUCOSE METER) kit Use as instructed 1 each 0  . cetirizine (ZYRTEC) 10 MG tablet Take 1 tablet (10 mg total) by mouth daily. 30 tablet 1  . glipiZIDE (GLUCOTROL) 10 MG tablet Take 1 tablet (10 mg total) by mouth 2 (two) times daily before a meal. 60 tablet 5  . glucose blood (TRUETEST TEST) test strip Use as instructed 100 each 12  . hydrochlorothiazide (HYDRODIURIL) 25 MG tablet Take 1 tablet (25 mg total) by mouth daily. 30 tablet 5  . Lancets (FREESTYLE) lancets Use as instructed 100 each 12  . lisinopril (PRINIVIL,ZESTRIL) 40 MG tablet Take 1 tablet (40 mg total) by mouth daily. 30 tablet 5  . metFORMIN (GLUCOPHAGE) 500 MG tablet TAKE 2 TABLETS BY MOUTH 2 TIMES DAILY WITH A MEAL. 120 tablet 5  . pantoprazole (PROTONIX) 40 MG tablet Take 1 tablet (40 mg total) by mouth daily. 30 tablet 5  . triamcinolone cream (KENALOG) 0.1 % Apply 1 application topically 2 (two)  times daily. 30 g 0  . Olopatadine HCl (PATADAY) 0.2 % SOLN Apply 1 drop to eye daily. (Patient not taking: Reported on 12/03/2017) 2.5 mL 1   No facility-administered medications prior to visit.     ROS Review of Systems  Constitutional: Negative for activity change, appetite change and fatigue.  HENT: Negative for congestion, sinus pressure and sore throat.   Eyes: Negative for visual disturbance.  Respiratory: Negative for cough, chest tightness, shortness of breath and wheezing.   Cardiovascular: Positive for leg swelling. Negative for chest pain and palpitations.  Gastrointestinal: Negative for abdominal distention, abdominal pain and constipation.  Endocrine: Negative for polydipsia.  Genitourinary: Negative for dysuria and frequency.  Musculoskeletal: Negative for arthralgias and back pain.  Skin: Negative for rash.  Neurological: Negative for tremors, light-headedness and numbness.  Hematological: Does not bruise/bleed easily.  Psychiatric/Behavioral: Negative for agitation and behavioral problems.    Objective:  BP (!) 145/72   Pulse 70   Temp (!) 97.4 F (36.3 C) (Oral)   Ht 5' 2" (1.575 m)   Wt 219 lb (99.3 kg)   SpO2 97%   BMI 40.06 kg/m   BP/Weight 12/03/2017 11/03/2017 11/13/2016  Systolic BP 145 200 181  Diastolic BP 72 74 77  Wt. (Lbs) 219 218 213.4    BMI 40.06 39.87 39.03      Physical Exam  Constitutional: She is oriented to person, place, and time. She appears well-developed and well-nourished. No distress.  HENT:  Head: Normocephalic.  Right Ear: External ear normal.  Left Ear: External ear normal.  Nose: Nose normal.  Mouth/Throat: Oropharynx is clear and moist.  Eyes: Pupils are equal, round, and reactive to light. Conjunctivae and EOM are normal.  Neck: Normal range of motion. No JVD present.  Cardiovascular: Normal rate, regular rhythm, normal heart sounds and intact distal pulses. Exam reveals no gallop.  No murmur heard. Pulmonary/Chest:  Effort normal and breath sounds normal. No respiratory distress. She has no wheezes. She has no rales. She exhibits no tenderness. Right breast exhibits no mass and no tenderness. Left breast exhibits no mass and no tenderness.  Abdominal: Soft. Bowel sounds are normal. She exhibits no distension and no mass. There is no tenderness.  Genitourinary:  Genitourinary Comments: Normal external genitalia, whitish discharge in vagina, normal cervix, normal adnexa, no CMT  Musculoskeletal: Normal range of motion. She exhibits edema (1+ R leg edema). She exhibits no tenderness.  Neurological: She is alert and oriented to person, place, and time. She has normal reflexes.  Skin: Skin is warm and dry. She is not diaphoretic.  Psychiatric: She has a normal mood and affect.    Lab Results  Component Value Date   HGBA1C 9.7 11/03/2017    Assessment & Plan:   1. Type 2 diabetes mellitus with diabetic neuropathy, without long-term current use of insulin (HCC) Uncontrolled with A1c of 9.0  Regimen had been adjusted at last visit Advised on low-sodium diet, compression stockings, elevate feet to help with leg swelling - POCT glucose (manual entry)  2. Well woman exam Counseled on 150 minutes of exercise per week, healthy eating (including decreased daily intake of saturated fats, cholesterol, added sugars, sodium), STI prevention, routine healthcare maintenance.   3. Bacterial vaginosis - metroNIDAZOLE (METROGEL VAGINAL) 0.75 % vaginal gel; Place 1 Applicatorful vaginally at bedtime.  Dispense: 70 g; Refill: 0  4. Screening for breast cancer - MM Digital Screening; Future  5. Screening for colon cancer - Ambulatory referral to Gastroenterology  6. Screening for cervical cancer - Cytology - PAP([place])   Meds ordered this encounter  Medications  . metroNIDAZOLE (METROGEL VAGINAL) 0.75 % vaginal gel    Sig: Place 1 Applicatorful vaginally at bedtime.    Dispense:  70 g    Refill:  0     Follow-up: Return in about 3 months (around 03/05/2018) for follow up of chronic medical conditions.   Enobong Newlin MD   

## 2017-12-03 NOTE — Patient Instructions (Signed)

## 2017-12-05 LAB — CYTOLOGY - PAP
Diagnosis: NEGATIVE
HPV (WINDOPATH): NOT DETECTED

## 2017-12-08 ENCOUNTER — Telehealth: Payer: Self-pay

## 2017-12-08 MED FILL — HYDROCHLOROTHIAZIDE 25 MG T: 25 | 30 days supply | Qty: 30 | Fill #0

## 2017-12-08 MED FILL — ?PANTOPRAZOLE SO DR 40MG TA: 40 | 30 days supply | Qty: 30 | Fill #0

## 2017-12-08 MED FILL — LISINOPRIL 40 MG TAB: 40 | 30 days supply | Qty: 30 | Fill #1

## 2017-12-08 MED FILL — AMLODIPINE BESYLATE 10 MG T: 10 | 30 days supply | Qty: 30 | Fill #1

## 2017-12-08 NOTE — Telephone Encounter (Signed)
Patient was called and informed of lab results. 

## 2018-02-05 MED FILL — LISINOPRIL 40 MG TABLET: 40 | 30 days supply | Qty: 30 | Fill #2

## 2018-02-05 MED FILL — ATORVASTATIN 20 MG TABLET: 20 | 30 days supply | Qty: 30 | Fill #0

## 2018-02-05 MED FILL — HYDROCHLOROTHIAZIDE 25 MG T: 25 | 30 days supply | Qty: 30 | Fill #1

## 2018-02-05 MED FILL — ?PANTOPRAZOLE SO DR 40MG TA: 40 | 30 days supply | Qty: 30 | Fill #1

## 2018-02-05 MED FILL — AMLODIPINE BESYLATE 10 MG T: 10 | 30 days supply | Qty: 30 | Fill #2

## 2018-03-05 ENCOUNTER — Ambulatory Visit: Payer: Self-pay | Admitting: Family Medicine

## 2018-03-10 ENCOUNTER — Encounter: Payer: Self-pay | Admitting: Family Medicine

## 2018-03-10 ENCOUNTER — Ambulatory Visit: Payer: Self-pay | Attending: Family Medicine | Admitting: Family Medicine

## 2018-03-10 ENCOUNTER — Encounter: Payer: Self-pay | Admitting: Pharmacist

## 2018-03-10 ENCOUNTER — Ambulatory Visit (HOSPITAL_BASED_OUTPATIENT_CLINIC_OR_DEPARTMENT_OTHER): Payer: Self-pay | Admitting: Pharmacist

## 2018-03-10 VITALS — BP 185/79 | HR 67 | Temp 97.4°F | Ht 62.0 in | Wt 221.0 lb

## 2018-03-10 DIAGNOSIS — Z7982 Long term (current) use of aspirin: Secondary | ICD-10-CM | POA: Insufficient documentation

## 2018-03-10 DIAGNOSIS — Z79899 Other long term (current) drug therapy: Secondary | ICD-10-CM | POA: Insufficient documentation

## 2018-03-10 DIAGNOSIS — Z8673 Personal history of transient ischemic attack (TIA), and cerebral infarction without residual deficits: Secondary | ICD-10-CM | POA: Insufficient documentation

## 2018-03-10 DIAGNOSIS — E1169 Type 2 diabetes mellitus with other specified complication: Secondary | ICD-10-CM | POA: Insufficient documentation

## 2018-03-10 DIAGNOSIS — E1140 Type 2 diabetes mellitus with diabetic neuropathy, unspecified: Secondary | ICD-10-CM | POA: Insufficient documentation

## 2018-03-10 DIAGNOSIS — E7800 Pure hypercholesterolemia, unspecified: Secondary | ICD-10-CM | POA: Insufficient documentation

## 2018-03-10 DIAGNOSIS — K219 Gastro-esophageal reflux disease without esophagitis: Secondary | ICD-10-CM | POA: Insufficient documentation

## 2018-03-10 DIAGNOSIS — E1129 Type 2 diabetes mellitus with other diabetic kidney complication: Secondary | ICD-10-CM

## 2018-03-10 DIAGNOSIS — Z794 Long term (current) use of insulin: Secondary | ICD-10-CM | POA: Insufficient documentation

## 2018-03-10 DIAGNOSIS — I10 Essential (primary) hypertension: Secondary | ICD-10-CM | POA: Insufficient documentation

## 2018-03-10 DIAGNOSIS — R809 Proteinuria, unspecified: Secondary | ICD-10-CM | POA: Insufficient documentation

## 2018-03-10 LAB — GLUCOSE, POCT (MANUAL RESULT ENTRY): POC Glucose: 263 mg/dl — AB (ref 70–99)

## 2018-03-10 LAB — POCT GLYCOSYLATED HEMOGLOBIN (HGB A1C): HBA1C, POC (CONTROLLED DIABETIC RANGE): 9.6 % — AB (ref 0.0–7.0)

## 2018-03-10 MED ORDER — HYDROCHLOROTHIAZIDE 25 MG PO TABS: 25.0000 mg | ORAL_TABLET | Freq: Every day | ORAL | 5 refills | Status: DC

## 2018-03-10 MED ORDER — TRUEPLUS LANCETS 28G MISC: 1.0000 | Freq: Three times a day (TID) | 12 refills | Status: DC

## 2018-03-10 MED ORDER — AMLODIPINE BESYLATE 10 MG PO TABS: 10.0000 mg | ORAL_TABLET | Freq: Every day | ORAL | 5 refills | Status: DC

## 2018-03-10 MED ORDER — LIRAGLUTIDE 18 MG/3ML ~~LOC~~ SOPN: PEN_INJECTOR | SUBCUTANEOUS | 3 refills | Status: DC

## 2018-03-10 MED ORDER — TRUE METRIX METER DEVI: 1.0000 | Freq: Three times a day (TID) | 0 refills | Status: DC

## 2018-03-10 MED ORDER — PANTOPRAZOLE SODIUM 40 MG PO TBEC: 40.0000 mg | DELAYED_RELEASE_TABLET | Freq: Every day | ORAL | 5 refills | Status: DC

## 2018-03-10 MED ORDER — GLUCOSE BLOOD VI STRP: ORAL_STRIP | 12 refills | Status: DC

## 2018-03-10 MED ORDER — METFORMIN HCL 500 MG PO TABS: ORAL_TABLET | ORAL | 5 refills | Status: DC

## 2018-03-10 MED ORDER — ATORVASTATIN CALCIUM 40 MG PO TABS: ORAL_TABLET | ORAL | 3 refills | Status: DC

## 2018-03-10 MED ORDER — GLIPIZIDE 10 MG PO TABS: 10.0000 mg | ORAL_TABLET | Freq: Two times a day (BID) | ORAL | 5 refills | Status: DC

## 2018-03-10 MED ORDER — LISINOPRIL 40 MG PO TABS: 40.0000 mg | ORAL_TABLET | Freq: Every day | ORAL | 5 refills | Status: DC

## 2018-03-10 MED FILL — AMLODIPINE BESYLATE 10 MG T: 10 | 30 days supply | Qty: 30 | Fill #0

## 2018-03-10 MED FILL — !TRUE METRIX BLOOD GLUCOSE: 365 days supply | Qty: 1 | Fill #0

## 2018-03-10 MED FILL — metFORMIN HCL 500 MG TABS: 500 | 30 days supply | Qty: 120 | Fill #0

## 2018-03-10 MED FILL — TRUEplus LANCETS 28G MISC: 30 days supply | Qty: 100 | Fill #0

## 2018-03-10 MED FILL — ?GLIPIZIDE 10 MG TABLET: 10 | 30 days supply | Qty: 60 | Fill #0

## 2018-03-10 MED FILL — TRUE METRIX TEST STRIP: 25 days supply | Qty: 100 | Fill #0

## 2018-03-10 MED FILL — ?ATORVASTATIN 40MG TABLET: 40 | 30 days supply | Qty: 30 | Fill #0

## 2018-03-10 MED FILL — ?PANTOPRAZOLE SO DR 40MG TA: 40 | 30 days supply | Qty: 30 | Fill #0

## 2018-03-10 MED FILL — HYDROCHLOROTHIAZIDE 25 MG T: 25 | 30 days supply | Qty: 30 | Fill #0

## 2018-03-10 MED FILL — LISINOPRIL 40 MG TABLET: 40 | 30 days supply | Qty: 30 | Fill #0

## 2018-03-10 MED FILL — **VICTOZA 18 MG/3 ML INJECT: 18 | 17 days supply | Qty: 3 | Fill #0

## 2018-03-10 NOTE — Patient Instructions (Signed)
Diabetes Mellitus and Nutrition When you have diabetes (diabetes mellitus), it is very important to have healthy eating habits because your blood sugar (glucose) levels are greatly affected by what you eat and drink. Eating healthy foods in the appropriate amounts, at about the same times every day, can help you:  Control your blood glucose.  Lower your risk of heart disease.  Improve your blood pressure.  Reach or maintain a healthy weight.  Every person with diabetes is different, and each person has different needs for a meal plan. Your health care provider may recommend that you work with a diet and nutrition specialist (dietitian) to make a meal plan that is best for you. Your meal plan may vary depending on factors such as:  The calories you need.  The medicines you take.  Your weight.  Your blood glucose, blood pressure, and cholesterol levels.  Your activity level.  Other health conditions you have, such as heart or kidney disease.  How do carbohydrates affect me? Carbohydrates affect your blood glucose level more than any other type of food. Eating carbohydrates naturally increases the amount of glucose in your blood. Carbohydrate counting is a method for keeping track of how many carbohydrates you eat. Counting carbohydrates is important to keep your blood glucose at a healthy level, especially if you use insulin or take certain oral diabetes medicines. It is important to know how many carbohydrates you can safely have in each meal. This is different for every person. Your dietitian can help you calculate how many carbohydrates you should have at each meal and for snack. Foods that contain carbohydrates include:  Bread, cereal, rice, pasta, and crackers.  Potatoes and corn.  Peas, beans, and lentils.  Milk and yogurt.  Fruit and juice.  Desserts, such as cakes, cookies, ice cream, and candy.  How does alcohol affect me? Alcohol can cause a sudden decrease in blood  glucose (hypoglycemia), especially if you use insulin or take certain oral diabetes medicines. Hypoglycemia can be a life-threatening condition. Symptoms of hypoglycemia (sleepiness, dizziness, and confusion) are similar to symptoms of having too much alcohol. If your health care provider says that alcohol is safe for you, follow these guidelines:  Limit alcohol intake to no more than 1 drink per day for nonpregnant women and 2 drinks per day for men. One drink equals 12 oz of beer, 5 oz of wine, or 1 oz of hard liquor.  Do not drink on an empty stomach.  Keep yourself hydrated with water, diet soda, or unsweetened iced tea.  Keep in mind that regular soda, juice, and other mixers may contain a lot of sugar and must be counted as carbohydrates.  What are tips for following this plan? Reading food labels  Start by checking the serving size on the label. The amount of calories, carbohydrates, fats, and other nutrients listed on the label are based on one serving of the food. Many foods contain more than one serving per package.  Check the total grams (g) of carbohydrates in one serving. You can calculate the number of servings of carbohydrates in one serving by dividing the total carbohydrates by 15. For example, if a food has 30 g of total carbohydrates, it would be equal to 2 servings of carbohydrates.  Check the number of grams (g) of saturated and trans fats in one serving. Choose foods that have low or no amount of these fats.  Check the number of milligrams (mg) of sodium in one serving. Most people   should limit total sodium intake to less than 2,300 mg per day.  Always check the nutrition information of foods labeled as "low-fat" or "nonfat". These foods may be higher in added sugar or refined carbohydrates and should be avoided.  Talk to your dietitian to identify your daily goals for nutrients listed on the label. Shopping  Avoid buying canned, premade, or processed foods. These  foods tend to be high in fat, sodium, and added sugar.  Shop around the outside edge of the grocery store. This includes fresh fruits and vegetables, bulk grains, fresh meats, and fresh dairy. Cooking  Use low-heat cooking methods, such as baking, instead of high-heat cooking methods like deep frying.  Cook using healthy oils, such as olive, canola, or sunflower oil.  Avoid cooking with butter, cream, or high-fat meats. Meal planning  Eat meals and snacks regularly, preferably at the same times every day. Avoid going long periods of time without eating.  Eat foods high in fiber, such as fresh fruits, vegetables, beans, and whole grains. Talk to your dietitian about how many servings of carbohydrates you can eat at each meal.  Eat 4-6 ounces of lean protein each day, such as lean meat, chicken, fish, eggs, or tofu. 1 ounce is equal to 1 ounce of meat, chicken, or fish, 1 egg, or 1/4 cup of tofu.  Eat some foods each day that contain healthy fats, such as avocado, nuts, seeds, and fish. Lifestyle   Check your blood glucose regularly.  Exercise at least 30 minutes 5 or more days each week, or as told by your health care provider.  Take medicines as told by your health care provider.  Do not use any products that contain nicotine or tobacco, such as cigarettes and e-cigarettes. If you need help quitting, ask your health care provider.  Work with a counselor or diabetes educator to identify strategies to manage stress and any emotional and social challenges. What are some questions to ask my health care provider?  Do I need to meet with a diabetes educator?  Do I need to meet with a dietitian?  What number can I call if I have questions?  When are the best times to check my blood glucose? Where to find more information:  American Diabetes Association: diabetes.org/food-and-fitness/food  Academy of Nutrition and Dietetics:  www.eatright.org/resources/health/diseases-and-conditions/diabetes  National Institute of Diabetes and Digestive and Kidney Diseases (NIH): www.niddk.nih.gov/health-information/diabetes/overview/diet-eating-physical-activity Summary  A healthy meal plan will help you control your blood glucose and maintain a healthy lifestyle.  Working with a diet and nutrition specialist (dietitian) can help you make a meal plan that is best for you.  Keep in mind that carbohydrates and alcohol have immediate effects on your blood glucose levels. It is important to count carbohydrates and to use alcohol carefully. This information is not intended to replace advice given to you by your health care provider. Make sure you discuss any questions you have with your health care provider. Document Released: 05/23/2005 Document Revised: 09/30/2016 Document Reviewed: 09/30/2016 Elsevier Interactive Patient Education  2018 Elsevier Inc.  

## 2018-03-10 NOTE — Progress Notes (Signed)
    S:    No chief complaint on file.  Patient referred by Dr. Margarita Rana to cover Victoza titration and injection technique. She was seen by Dr. Margarita Rana in the office today.  Patient states that she knows why Dr. Margarita Rana prescribed Victoza.    Patient reports adherence with medications.   Current diabetes medications include:  - glipizide 10 mg BID   - metformin 500 mg 2 tablets BID  Patient denies hypoglycemic events.  O:  No objective measures taken  A/P: Diabetes longstanding currently uncontrolled. Patient is able to verbalize appropriate hypoglycemia management plan. She is adherent with medication. Dr. Margarita Rana is adding Victoza today.  -Started Victoza (liraglutide) at 0.6 mg daily x 1 week, 1.2 mg daily x 1 week, and then 1.8 mg daily thereafter -Answered all questions concerning Victoza and role in therapy. Covered common side effects and warnings. Discussed injection technique and had patient demonstrate using teach-back.  -Extensively discussed pathophysiology of DM, recommended lifestyle interventions, dietary effects on glycemic control -Counseled on s/sx of and management of hypoglycemia -Next A1C anticipated 06/2018.   Written patient instructions provided.  Total time in face to face counseling 15 minutes.   Follow up PCP Clinic Visit in 3 months.   Benard Halsted, PharmD, [place] [phone]

## 2018-03-10 NOTE — Progress Notes (Signed)
Subjective:  Patient ID: Tina Gomez, female    DOB: Oct 19, 1955  Age: 62 y.o. MRN: [id number]  CC: Diabetes   HPI Tina Gomez is a 62 year old female with history of hypertension, hyperlipidemia, type 2 diabetes mellitus (A1c 9.6), GERD who comes into the clinic for follow-up visit. She was away in [place] visiting family and plans to return next week again. Endorses compliance with her insulin but her A1c has remained persistently elevated; she has not been compliant with exercise due to problems in her knees and previous history of stroke which causes her to take frequent breaks while walking - she has also not been compliant with a diabetic diet. Her blood pressure is elevated and she endorses not taking her morning dose of antihypertensives today as she is fasting in anticipation of labs. Doing well on his statin with no complaints of myalgias and her reflux symptoms are controlled Denies chest pain, shortness of breath.  Past Medical History:  Diagnosis Date  . Hypertension   . Stroke [facility])     Past Surgical History:  Procedure Laterality Date  . CESAREAN SECTION      Allergies  Allergen Reactions  . Codeine Nausea And Vomiting     Outpatient Medications Prior to Visit  Medication Sig Dispense Refill  . aspirin 325 MG tablet Take 1 tablet (325 mg total) by mouth daily. 30 tablet 7  . Blood Glucose Monitoring Suppl (BLOOD GLUCOSE METER) kit Use as instructed 1 each 0  . cetirizine (ZYRTEC) 10 MG tablet Take 1 tablet (10 mg total) by mouth daily. 30 tablet 1  . glucose blood (TRUETEST TEST) test strip Use as instructed 100 each 12  . Lancets (FREESTYLE) lancets Use as instructed 100 each 12  . triamcinolone cream (KENALOG) 0.1 % Apply 1 application topically 2 (two) times daily. 30 g 0  . amLODipine (NORVASC) 10 MG tablet Take 1 tablet (10 mg total) by mouth daily. 30 tablet 5  . atorvastatin (LIPITOR) 40 MG tablet TAKE 1 TABLET BY MOUTH DAILY AT 6 PM. 30 tablet 3    . glipiZIDE (GLUCOTROL) 10 MG tablet Take 1 tablet (10 mg total) by mouth 2 (two) times daily before a meal. 60 tablet 5  . hydrochlorothiazide (HYDRODIURIL) 25 MG tablet Take 1 tablet (25 mg total) by mouth daily. 30 tablet 5  . lisinopril (PRINIVIL,ZESTRIL) 40 MG tablet Take 1 tablet (40 mg total) by mouth daily. 30 tablet 5  . metFORMIN (GLUCOPHAGE) 500 MG tablet TAKE 2 TABLETS BY MOUTH 2 TIMES DAILY WITH A MEAL. 120 tablet 5  . pantoprazole (PROTONIX) 40 MG tablet Take 1 tablet (40 mg total) by mouth daily. 30 tablet 5  . metroNIDAZOLE (METROGEL VAGINAL) 0.75 % vaginal gel Place 1 Applicatorful vaginally at bedtime. (Patient not taking: Reported on 03/10/2018) 70 g 0  . Olopatadine HCl (PATADAY) 0.2 % SOLN Apply 1 drop to eye daily. (Patient not taking: Reported on 12/03/2017) 2.5 mL 1   No facility-administered medications prior to visit.     ROS Review of Systems  Constitutional: Negative for activity change, appetite change and fatigue.  HENT: Negative for congestion, sinus pressure and sore throat.   Eyes: Negative for visual disturbance.  Respiratory: Negative for cough, chest tightness, shortness of breath and wheezing.   Cardiovascular: Negative for chest pain and palpitations.  Gastrointestinal: Negative for abdominal distention, abdominal pain and constipation.  Endocrine: Negative for polydipsia.  Genitourinary: Negative for dysuria and frequency.  Musculoskeletal: Negative for arthralgias and  back pain.  Skin: Negative for rash.  Neurological: Negative for tremors, light-headedness and numbness.  Hematological: Does not bruise/bleed easily.  Psychiatric/Behavioral: Negative for agitation and behavioral problems.    Objective:  BP (!) 185/79   Pulse 67   Temp (!) 97.4 F (36.3 C) (Oral)   Ht 5' 2"  (1.575 m)   Wt 221 lb (100.2 kg)   SpO2 97%   BMI 40.42 kg/m   BP/Weight 03/10/2018 12/03/2017 0/76/8088  Systolic BP 110 315 945  Diastolic BP 79 72 74  Wt. (Lbs) 221  219 218  BMI 40.42 40.06 39.87      Physical Exam  Constitutional: She is oriented to person, place, and time. She appears well-developed and well-nourished.  Cardiovascular: Normal rate, normal heart sounds and intact distal pulses.  No murmur heard. Pulmonary/Chest: Effort normal and breath sounds normal. She has no wheezes. She has no rales. She exhibits no tenderness.  Abdominal: Soft. Bowel sounds are normal. She exhibits no distension and no mass. There is no tenderness.  Musculoskeletal: Normal range of motion.  Neurological: She is alert and oriented to person, place, and time.  Skin: Skin is warm and dry.  Psychiatric: She has a normal mood and affect.      CMP Latest Ref Rng & Units 11/04/2017 11/13/2016 02/15/2016  Glucose 65 - 99 mg/dL 220(H) 230(H) 232(H)  BUN 8 - 27 mg/dL 27 31(H) 28(H)  Creatinine 0.57 - 1.00 mg/dL 1.40(H) 1.25(H) 1.21(H)  Sodium 134 - 144 mmol/L 140 138 135  Potassium 3.5 - 5.2 mmol/L 4.9 4.6 4.3  Chloride 96 - 106 mmol/L 99 103 101  CO2 20 - 29 mmol/L 23 23 26   Calcium 8.7 - 10.3 mg/dL 9.5 9.1 8.8  Total Protein 6.0 - 8.5 g/dL 7.1 7.0 7.1  Total Bilirubin 0.0 - 1.2 mg/dL 0.2 0.3 0.3  Alkaline Phos 39 - 117 IU/L 134(H) 124 133(H)  AST 0 - 40 IU/L 12 8(L) 10  ALT 0 - 32 IU/L 12 9 9     Lipid Panel     Component Value Date/Time   CHOL 221 (H) 11/04/2017 0931   TRIG 275 (H) 11/04/2017 0931   HDL 42 11/04/2017 0931   CHOLHDL 5.3 (H) 11/04/2017 0931   CHOLHDL 4.8 11/13/2016 1018   VLDL 40 (H) 02/15/2016 0907   LDLCALC 124 (H) 11/04/2017 0931    Lab Results  Component Value Date   HGBA1C 9.6 (A) 03/10/2018    Assessment & Plan:   1. Type 2 diabetes mellitus with diabetic neuropathy, without long-term current use of insulin (HCC) Uncontrolled with A1c of 9.6 Comments Victoza Counseled on Diabetic diet, my plate method, 859 minutes of moderate intensity exercise/week Keep blood sugar logs with fasting goals of 80-120 mg/dl, random of less  than 180 and in the event of sugars less than 60 mg/dl or greater than 400 mg/dl please notify the clinic ASAP. It is recommended that you undergo annual eye exams and annual foot exams. Pneumonia vaccine is recommended. - POCT glucose (manual entry) - POCT glycosylated hemoglobin (Hb A1C) - Lipid panel - Comprehensive metabolic panel - Microalbumin/Creatinine Ratio, Urine - liraglutide (VICTOZA) 18 MG/3ML SOPN; Inject subcutaneously daily 0.6 mg for 1 week then 1.2 mg for 1 week then1.8 mg thereafter.  Dispense: 30 mL; Refill: 3  2. Essential hypertension, benign Uncontrolled due to not taking medications this morning No regimen change-compliance emphasized Counseled on blood pressure goal of less than 130/80, low-sodium, DASH diet, medication compliance, 150 minutes of  moderate intensity exercise per week. Discussed medication compliance, adverse effects. - amLODipine (NORVASC) 10 MG tablet; Take 1 tablet (10 mg total) by mouth daily.  Dispense: 30 tablet; Refill: 5 - hydrochlorothiazide (HYDRODIURIL) 25 MG tablet; Take 1 tablet (25 mg total) by mouth daily.  Dispense: 30 tablet; Refill: 5 - lisinopril (PRINIVIL,ZESTRIL) 40 MG tablet; Take 1 tablet (40 mg total) by mouth daily.  Dispense: 30 tablet; Refill: 5  3. Pure hypercholesterolemia Uncontrolled We will check lipids today Low-cholesterol diet - atorvastatin (LIPITOR) 40 MG tablet; TAKE 1 TABLET BY MOUTH DAILY AT 6 PM.  Dispense: 30 tablet; Refill: 3  4. Type 2 diabetes mellitus with microalbuminuria, with long-term current use of insulin (HCC) - glipiZIDE (GLUCOTROL) 10 MG tablet; Take 1 tablet (10 mg total) by mouth 2 (two) times daily before a meal.  Dispense: 60 tablet; Refill: 5 - metFORMIN (GLUCOPHAGE) 500 MG tablet; TAKE 2 TABLETS BY MOUTH 2 TIMES DAILY WITH A MEAL.  Dispense: 120 tablet; Refill: 5  5. Gastroesophageal reflux disease without esophagitis Stable - pantoprazole (PROTONIX) 40 MG tablet; Take 1 tablet (40 mg  total) by mouth daily.  Dispense: 30 tablet; Refill: 5   Meds ordered this encounter  Medications  . liraglutide (VICTOZA) 18 MG/3ML SOPN    Sig: Inject subcutaneously daily 0.6 mg for 1 week then 1.2 mg for 1 week then1.8 mg thereafter.    Dispense:  30 mL    Refill:  3  . amLODipine (NORVASC) 10 MG tablet    Sig: Take 1 tablet (10 mg total) by mouth daily.    Dispense:  30 tablet    Refill:  5  . atorvastatin (LIPITOR) 40 MG tablet    Sig: TAKE 1 TABLET BY MOUTH DAILY AT 6 PM.    Dispense:  30 tablet    Refill:  3    Discontinue previous dose  . glipiZIDE (GLUCOTROL) 10 MG tablet    Sig: Take 1 tablet (10 mg total) by mouth 2 (two) times daily before a meal.    Dispense:  60 tablet    Refill:  5  . hydrochlorothiazide (HYDRODIURIL) 25 MG tablet    Sig: Take 1 tablet (25 mg total) by mouth daily.    Dispense:  30 tablet    Refill:  5  . lisinopril (PRINIVIL,ZESTRIL) 40 MG tablet    Sig: Take 1 tablet (40 mg total) by mouth daily.    Dispense:  30 tablet    Refill:  5  . metFORMIN (GLUCOPHAGE) 500 MG tablet    Sig: TAKE 2 TABLETS BY MOUTH 2 TIMES DAILY WITH A MEAL.    Dispense:  120 tablet    Refill:  5  . pantoprazole (PROTONIX) 40 MG tablet    Sig: Take 1 tablet (40 mg total) by mouth daily.    Dispense:  30 tablet    Refill:  5    Follow-up: Return in about 3 months (around 06/10/2018) for Follow-up of diabetes mellitus.   Charlott Rakes MD

## 2018-03-11 LAB — COMPREHENSIVE METABOLIC PANEL
A/G RATIO: 1.4 (ref 1.2–2.2)
ALBUMIN: 3.8 g/dL (ref 3.6–4.8)
ALT: 6 IU/L (ref 0–32)
AST: 7 IU/L (ref 0–40)
Alkaline Phosphatase: 122 IU/L — ABNORMAL HIGH (ref 39–117)
BUN / CREAT RATIO: 25 (ref 12–28)
BUN: 31 mg/dL — AB (ref 8–27)
CALCIUM: 9.2 mg/dL (ref 8.7–10.3)
CHLORIDE: 103 mmol/L (ref 96–106)
CO2: 20 mmol/L (ref 20–29)
Creatinine, Ser: 1.24 mg/dL — ABNORMAL HIGH (ref 0.57–1.00)
GFR calc Af Amer: 55 mL/min/{1.73_m2} — ABNORMAL LOW (ref >59)
GFR, EST NON AFRICAN AMERICAN: 47 mL/min/{1.73_m2} — AB (ref >59)
GLUCOSE: 236 mg/dL — AB (ref 65–99)
Globulin, Total: 2.7 g/dL (ref 1.5–4.5)
Potassium: 4.7 mmol/L (ref 3.5–5.2)
Sodium: 138 mmol/L (ref 134–144)
TOTAL PROTEIN: 6.5 g/dL (ref 6.0–8.5)

## 2018-03-11 LAB — MICROALBUMIN / CREATININE URINE RATIO
Creatinine, Urine: 46.4 mg/dL
MICROALB/CREAT RATIO: 3079.7 mg/g{creat} — AB (ref 0.0–30.0)
Microalbumin, Urine: 1429 ug/mL

## 2018-03-11 LAB — LIPID PANEL
Chol/HDL Ratio: 5.1 ratio — ABNORMAL HIGH (ref 0.0–4.4)
Cholesterol, Total: 190 mg/dL (ref 100–199)
HDL: 37 mg/dL — AB (ref >39)
LDL Calculated: 88 mg/dL (ref 0–99)
TRIGLYCERIDES: 327 mg/dL — AB (ref 0–149)
VLDL Cholesterol Cal: 65 mg/dL — ABNORMAL HIGH (ref 5–40)

## 2018-03-13 ENCOUNTER — Telehealth: Payer: Self-pay

## 2018-03-13 NOTE — Telephone Encounter (Signed)
Patient was called and informed of lab results. 

## 2018-04-24 MED FILL — ?PANTOPRAZOLE SO DR 40MG TA: 40 | 30 days supply | Qty: 30 | Fill #1

## 2018-04-24 MED FILL — ?ATORVASTATIN 40MG TABLET: 40 | 30 days supply | Qty: 30 | Fill #1

## 2018-04-24 MED FILL — HYDROCHLOROTHIAZIDE 25 MG T: 25 | 30 days supply | Qty: 30 | Fill #1

## 2018-04-24 MED FILL — !VICTOZA 18MG/3ML INJECT: 18 | 28 days supply | Qty: 9 | Fill #1

## 2018-04-24 MED FILL — LISINOPRIL 40 MG TABLET: 40 | 30 days supply | Qty: 30 | Fill #1

## 2018-04-24 MED FILL — AMLODIPINE BESYLATE 10 MG T: 10 | 30 days supply | Qty: 30 | Fill #1

## 2018-04-24 MED FILL — ?GLIPIZIDE 10 MG TABLET: 10 | 30 days supply | Qty: 60 | Fill #1

## 2018-05-15 ENCOUNTER — Ambulatory Visit: Payer: Self-pay | Attending: Family Medicine

## 2018-06-11 ENCOUNTER — Ambulatory Visit: Payer: Self-pay | Admitting: Family Medicine

## 2018-06-24 MED FILL — LISINOPRIL 40 MG TABLET: 40 | 30 days supply | Qty: 30 | Fill #2

## 2018-06-24 MED FILL — AMLODIPINE BESYLATE 10 MG T: 10 | 30 days supply | Qty: 30 | Fill #2

## 2018-06-24 MED FILL — PANTOPRAZOLE SOD DR 40 MG T: 40 | 30 days supply | Qty: 30 | Fill #2

## 2018-07-27 MED FILL — AMLODIPINE BESYLATE 10 MG T: 10 | 30 days supply | Qty: 30 | Fill #3

## 2018-07-27 MED FILL — LISINOPRIL 40 MG TABLET: 40 | 30 days supply | Qty: 30 | Fill #3

## 2018-07-27 MED FILL — PANTOPRAZOLE SOD DR 40 MG T: 40 | 30 days supply | Qty: 30 | Fill #3

## 2018-08-27 MED FILL — HYDROCHLOROTHIAZIDE 25 MG T: 25 | 30 days supply | Qty: 30 | Fill #2

## 2018-09-22 MED FILL — ?PANTOPRAZOLE SO DR 40MG TA: 40 | 30 days supply | Qty: 30 | Fill #4

## 2018-09-22 MED FILL — HYDROCHLOROTHIAZIDE 25 MG T: 25 | 30 days supply | Qty: 30 | Fill #3

## 2018-09-22 MED FILL — ?ATORVASTATIN 20 MG TABLET: 20 | 30 days supply | Qty: 30 | Fill #1

## 2018-09-22 MED FILL — ?METFORMIN HCL 500MG TABLET: 500 | 30 days supply | Qty: 120 | Fill #1

## 2018-09-22 MED FILL — ?GLIPIZIDE 10 MG TABLET: 10 | 30 days supply | Qty: 60 | Fill #2

## 2018-09-22 MED FILL — LISINOPRIL 40 MG TABLET: 40 | 30 days supply | Qty: 30 | Fill #4

## 2018-09-28 MED FILL — AMLODIPINE BESYLATE 10 MG T: 10 | 30 days supply | Qty: 30 | Fill #4

## 2018-10-26 MED FILL — AMLODIPINE BESYLATE 10 MG T: 10 | 30 days supply | Qty: 30 | Fill #5

## 2018-10-26 MED FILL — ?ATORVASTATIN 20 MG TABLET: 20 | 30 days supply | Qty: 30 | Fill #2

## 2018-10-26 MED FILL — LISINOPRIL 40 MG TABLET: 40 | 30 days supply | Qty: 30 | Fill #5

## 2018-10-26 MED FILL — ?PANTOPRAZOLE SOD DR 40MG T: 40 | 30 days supply | Qty: 30 | Fill #5

## 2018-10-26 MED FILL — ?GLIPIZIDE 10 MG TABLET: 10 | 30 days supply | Qty: 60 | Fill #3

## 2018-10-26 MED FILL — HYDROCHLOROTHIAZIDE 25 MG T: 25 | 30 days supply | Qty: 30 | Fill #4

## 2018-11-17 ENCOUNTER — Other Ambulatory Visit: Payer: Self-pay | Admitting: Family Medicine

## 2018-11-17 DIAGNOSIS — I10 Essential (primary) hypertension: Secondary | ICD-10-CM

## 2018-11-17 DIAGNOSIS — K219 Gastro-esophageal reflux disease without esophagitis: Secondary | ICD-10-CM

## 2018-11-17 DIAGNOSIS — E7800 Pure hypercholesterolemia, unspecified: Secondary | ICD-10-CM

## 2018-11-19 ENCOUNTER — Other Ambulatory Visit: Payer: Self-pay | Admitting: Family Medicine

## 2018-11-19 DIAGNOSIS — E7800 Pure hypercholesterolemia, unspecified: Secondary | ICD-10-CM

## 2018-11-19 DIAGNOSIS — I10 Essential (primary) hypertension: Secondary | ICD-10-CM

## 2018-11-19 DIAGNOSIS — K219 Gastro-esophageal reflux disease without esophagitis: Secondary | ICD-10-CM

## 2018-11-25 ENCOUNTER — Other Ambulatory Visit: Payer: Self-pay

## 2018-11-25 ENCOUNTER — Ambulatory Visit: Payer: Self-pay | Attending: Family Medicine | Admitting: Physician Assistant

## 2018-11-25 VITALS — BP 163/79 | HR 75 | Temp 97.6°F | Ht 62.0 in | Wt 215.0 lb

## 2018-11-25 DIAGNOSIS — I10 Essential (primary) hypertension: Secondary | ICD-10-CM

## 2018-11-25 DIAGNOSIS — E1140 Type 2 diabetes mellitus with diabetic neuropathy, unspecified: Secondary | ICD-10-CM

## 2018-11-25 DIAGNOSIS — E7800 Pure hypercholesterolemia, unspecified: Secondary | ICD-10-CM

## 2018-11-25 DIAGNOSIS — K219 Gastro-esophageal reflux disease without esophagitis: Secondary | ICD-10-CM

## 2018-11-25 LAB — GLUCOSE, POCT (MANUAL RESULT ENTRY): POC GLUCOSE: 204 mg/dL — AB (ref 70–99)

## 2018-11-25 MED ORDER — METFORMIN HCL 500 MG PO TABS: ORAL_TABLET | ORAL | 1 refills | Status: DC

## 2018-11-25 MED ORDER — ASPIRIN 325 MG PO TABS: 325.0000 mg | ORAL_TABLET | Freq: Every day | ORAL | 1 refills | Status: DC

## 2018-11-25 MED ORDER — HYDROCHLOROTHIAZIDE 25 MG PO TABS: 25.0000 mg | ORAL_TABLET | Freq: Every day | ORAL | 1 refills | Status: DC

## 2018-11-25 MED ORDER — AMLODIPINE BESYLATE 10 MG PO TABS: 10.0000 mg | ORAL_TABLET | Freq: Every day | ORAL | 1 refills | Status: DC

## 2018-11-25 MED ORDER — PANTOPRAZOLE SODIUM 40 MG PO TBEC: 40.0000 mg | DELAYED_RELEASE_TABLET | Freq: Every day | ORAL | 1 refills | Status: DC

## 2018-11-25 MED ORDER — ATORVASTATIN CALCIUM 40 MG PO TABS: ORAL_TABLET | ORAL | 1 refills | Status: DC

## 2018-11-25 MED ORDER — GLIPIZIDE 10 MG PO TABS: 10.0000 mg | ORAL_TABLET | Freq: Two times a day (BID) | ORAL | 1 refills | Status: DC

## 2018-11-25 MED ORDER — LISINOPRIL 40 MG PO TABS: 40.0000 mg | ORAL_TABLET | Freq: Every day | ORAL | 1 refills | Status: DC

## 2018-11-25 MED FILL — AMLODIPINE BESYLATE 10 MG T: 10 | 90 days supply | Qty: 90 | Fill #0

## 2018-11-25 MED FILL — LISINOPRIL 40 MG TABLET: 40 | 90 days supply | Qty: 90 | Fill #0

## 2018-11-25 MED FILL — ?GLIPIZIDE 10 MG TABLET: 10 | 90 days supply | Qty: 180 | Fill #0

## 2018-11-25 MED FILL — ?ATORVASTATIN 40MG TABLET: 40 | 90 days supply | Qty: 90 | Fill #0

## 2018-11-25 MED FILL — PANTOPRAZOLE SOD DR 40 MG T: 40 | 90 days supply | Qty: 90 | Fill #0

## 2018-11-25 MED FILL — HYDROCHLOROTHIAZIDE 25 MG T: 25 | 90 days supply | Qty: 90 | Fill #0

## 2018-11-25 MED FILL — ?METFORMIN HCL 500MG TABL: 500 | 90 days supply | Qty: 360 | Fill #0

## 2018-11-25 NOTE — Progress Notes (Signed)
Patient ID: Tina Gomez, female   DOB: [birth year], 63 y.o.   MRN: [id number]   Anya Murphey, is a 63 y.o. female  WVP:[id number]  NIO:[id number]  DOB - [birth year]  Subjective:  Chief Complaint and HPI: Tina Gomez is a 63 y.o. female here today for med RF.  Blood sugars run from 130-250.  She has been out of all her meds for about one week.  Admits to poor compliance with 2nd dose daily of diabetes meds.  Says she only takes diabetes meds for morning dose and forgets evening dose.    Takes BP meds as directed but out for 1 week.  Denies CP/HA/Dizziness.    Wants 90 day supply of meds due to Covid concerns.    ROS:   Constitutional:  No f/c, No night sweats, No unexplained weight loss. EENT:  No vision changes, No blurry vision, No hearing changes. No mouth, throat, or ear problems.  Respiratory: No cough, No SOB Cardiac: No CP, no palpitations GI:  No abd pain, No N/V/D. GU: No Urinary s/sx Musculoskeletal: No joint pain Neuro: No headache, no dizziness, no motor weakness.  Skin: No rash Endocrine:  No polydipsia. No polyuria.  Psych: Denies SI/HI  No problems updated.  ALLERGIES: Allergies  Allergen Reactions  . Codeine Nausea And Vomiting    PAST MEDICAL HISTORY: Past Medical History:  Diagnosis Date  . Hypertension   . Stroke [facility])     MEDICATIONS AT HOME: Prior to Admission medications   Medication Sig Start Date End Date Taking? Authorizing Provider  amLODipine (NORVASC) 10 MG tablet Take 1 tablet (10 mg total) by mouth daily. 11/25/18  Yes Argentina Donovan, PA-C  aspirin 325 MG tablet Take 1 tablet (325 mg total) by mouth daily. 11/25/18  Yes Freeman Caldron M, PA-C  atorvastatin (LIPITOR) 40 MG tablet TAKE 1 TABLET BY MOUTH DAILY AT 6 PM. 11/25/18  Yes Myreon Wimer M, PA-C  Blood Glucose Monitoring Suppl (BLOOD GLUCOSE METER) kit Use as instructed 11/01/13  Yes Reyne Dumas, MD  Blood Glucose Monitoring Suppl (TRUE METRIX METER) DEVI 1 each by Does not  apply route 3 (three) times daily before meals. 03/10/18  Yes Charlott Rakes, MD  cetirizine (ZYRTEC) 10 MG tablet Take 1 tablet (10 mg total) by mouth daily. 11/13/16  Yes Charlott Rakes, MD  glipiZIDE (GLUCOTROL) 10 MG tablet Take 1 tablet (10 mg total) by mouth 2 (two) times daily before a meal. 11/25/18  Yes Gerado Nabers M, PA-C  glucose blood (TRUETEST TEST) test strip Use as instructed 03/10/18  Yes Newlin, Enobong, MD  hydrochlorothiazide (HYDRODIURIL) 25 MG tablet Take 1 tablet (25 mg total) by mouth daily. 11/25/18  Yes Freeman Caldron M, PA-C  Lancets (FREESTYLE) lancets Use as instructed 11/01/13  Yes Reyne Dumas, MD  lisinopril (PRINIVIL,ZESTRIL) 40 MG tablet Take 1 tablet (40 mg total) by mouth daily. 11/25/18  Yes Akili Corsetti M, PA-C  metFORMIN (GLUCOPHAGE) 500 MG tablet TAKE 2 TABLETS BY MOUTH 2 TIMES DAILY WITH A MEAL. 11/25/18  Yes Freeman Caldron M, PA-C  pantoprazole (PROTONIX) 40 MG tablet Take 1 tablet (40 mg total) by mouth daily. 11/25/18  Yes Natascha Edmonds, Dionne Bucy, PA-C  TRUEPLUS LANCETS 28G MISC 1 each by Does not apply route 3 (three) times daily before meals. 03/10/18  Yes Charlott Rakes, MD  Olopatadine HCl (PATADAY) 0.2 % SOLN Apply 1 drop to eye daily. Patient not taking: Reported on 12/03/2017 11/13/16   Charlott Rakes, MD  triamcinolone cream (KENALOG) 0.1 %  Apply 1 application topically 2 (two) times daily. Patient not taking: Reported on 11/25/2018 02/14/16   Charlott Rakes, MD     Objective:  EXAM:   Vitals:   11/25/18 1331  BP: (!) 163/79  Pulse: 75  Temp: 97.6 F (36.4 C)  TempSrc: Oral  SpO2: 95%  Weight: 215 lb (97.5 kg)  Height: 5' 2"  (1.575 m)    General appearance : A&OX3. NAD. Non-toxic-appearing HEENT: Atraumatic and Normocephalic.  PERRLA. EOM intact.  Neck: supple, no JVD. No cervical lymphadenopathy. No thyromegaly Chest/Lungs:  Breathing-non-labored, Good air entry bilaterally, breath sounds normal without rales, rhonchi, or wheezing  CVS: S1  S2 regular, no murmurs, gallops, rubs  Extremities: Bilateral Lower Ext shows no edema, both legs are warm to touch with = pulse throughout Neurology:  CN II-XII grossly intact, Non focal.   Psych:  TP linear. J/I WNL. Normal speech. Appropriate eye contact and affect.  Skin:  No Rash  Data Review Lab Results  Component Value Date   HGBA1C 9.6 (A) 03/10/2018   HGBA1C 9.7 11/03/2017   HGBA1C 10.6 11/13/2016     Assessment & Plan   1. Type 2 diabetes mellitus with diabetic neuropathy, without long-term current use of insulin (HCC) Uncontrolled-recommended timer/alarm/pill reminder/divider container to assist with remembering second dose.  Diet and exercise - Glucose (CBG) - metFORMIN (GLUCOPHAGE) 500 MG tablet; TAKE 2 TABLETS BY MOUTH 2 TIMES DAILY WITH A MEAL.  Dispense: 360 tablet; Refill: 1 - glipiZIDE (GLUCOTROL) 10 MG tablet; Take 1 tablet (10 mg total) by mouth 2 (two) times daily before a meal.  Dispense: 180 tablet; Refill: 1 - Hemoglobin A1c - Comprehensive metabolic panel  2. Gastroesophageal reflux disease without esophagitis stable - pantoprazole (PROTONIX) 40 MG tablet; Take 1 tablet (40 mg total) by mouth daily.  Dispense: 90 tablet; Refill: 1   3. Essential hypertension, benign Uncontrolled-resume - lisinopril (PRINIVIL,ZESTRIL) 40 MG tablet; Take 1 tablet (40 mg total) by mouth daily.  Dispense: 90 tablet; Refill: 1 - hydrochlorothiazide (HYDRODIURIL) 25 MG tablet; Take 1 tablet (25 mg total) by mouth daily.  Dispense: 90 tablet; Refill: 1 - amLODipine (NORVASC) 10 MG tablet; Take 1 tablet (10 mg total) by mouth daily.  Dispense: 90 tablet; Refill: 1 - aspirin 325 MG tablet; Take 1 tablet (325 mg total) by mouth daily.  Dispense: 90 tablet; Refill: 1 - Comprehensive metabolic panel  4. Pure hypercholesterolemia - atorvastatin (LIPITOR) 40 MG tablet; TAKE 1 TABLET BY MOUTH DAILY AT 6 PM.  Dispense: 90 tablet; Refill: 1 - Lipid panel     Patient have been  counseled extensively about nutrition and exercise  Return in about 3 months (around 02/25/2019) for Dr Johnette Abraham and htn.  The patient was given clear instructions to go to ER or return to medical center if symptoms don't improve, worsen or new problems develop. The patient verbalized understanding. The patient was told to call to get lab results if they haven't heard anything in the next week.     Freeman Caldron, PA-C [facility] [place], [place]   11/25/2018, 1:48 PM

## 2018-11-25 NOTE — Patient Instructions (Signed)
Check blood sugars fasting and at bedtime and record and bring to next visit.   Check blood pressure OOO and record and bring to next visit.  Take ALL meds as directed.

## 2018-11-26 LAB — COMPREHENSIVE METABOLIC PANEL
ALK PHOS: 140 IU/L — AB (ref 39–117)
ALT: 12 IU/L (ref 0–32)
AST: 13 IU/L (ref 0–40)
Albumin/Globulin Ratio: 1.2 (ref 1.2–2.2)
Albumin: 3.4 g/dL — ABNORMAL LOW (ref 3.8–4.8)
BUN/Creatinine Ratio: 20 (ref 12–28)
BUN: 36 mg/dL — ABNORMAL HIGH (ref 8–27)
Bilirubin Total: <0.2 mg/dL (ref 0.0–1.2)
CO2: 23 mmol/L (ref 20–29)
Calcium: 9.2 mg/dL (ref 8.7–10.3)
Chloride: 102 mmol/L (ref 96–106)
Creatinine, Ser: 1.79 mg/dL — ABNORMAL HIGH (ref 0.57–1.00)
GFR calc Af Amer: 35 mL/min/{1.73_m2} — ABNORMAL LOW (ref >59)
GFR calc non Af Amer: 30 mL/min/{1.73_m2} — ABNORMAL LOW (ref >59)
Globulin, Total: 2.9 g/dL (ref 1.5–4.5)
Glucose: 198 mg/dL — ABNORMAL HIGH (ref 65–99)
Potassium: 4.8 mmol/L (ref 3.5–5.2)
Sodium: 141 mmol/L (ref 134–144)
Total Protein: 6.3 g/dL (ref 6.0–8.5)

## 2018-11-26 LAB — LIPID PANEL
Chol/HDL Ratio: 5.7 ratio — ABNORMAL HIGH (ref 0.0–4.4)
Cholesterol, Total: 251 mg/dL — ABNORMAL HIGH (ref 100–199)
HDL: 44 mg/dL (ref >39)
LDL Calculated: 153 mg/dL — ABNORMAL HIGH (ref 0–99)
Triglycerides: 272 mg/dL — ABNORMAL HIGH (ref 0–149)
VLDL Cholesterol Cal: 54 mg/dL — ABNORMAL HIGH (ref 5–40)

## 2018-11-26 LAB — HEMOGLOBIN A1C
Est. average glucose Bld gHb Est-mCnc: 206 mg/dL
Hgb A1c MFr Bld: 8.8 % — ABNORMAL HIGH (ref 4.8–5.6)

## 2018-11-27 ENCOUNTER — Telehealth: Payer: Self-pay | Admitting: *Deleted

## 2018-11-27 NOTE — Telephone Encounter (Signed)
Medical Assistant left message on patient's home and cell voicemail. Voicemail states to give a call back to Nubia with [facility] at [phone].  

## 2018-11-27 NOTE — Telephone Encounter (Signed)
-----   Message from Argentina Donovan, [place] sent at 11/26/2018  8:25 AM EDT ----- Please call patient. Her diabetes, kidney function, and cholesterol are all out of control.  Remind her to take all meds as directed esp her evening doses so this will be better controlled.  Follow-up as planned.  Thanks, Freeman Caldron, PA-C

## 2019-03-02 ENCOUNTER — Ambulatory Visit: Payer: Self-pay | Admitting: Family Medicine

## 2019-03-09 ENCOUNTER — Encounter: Payer: Self-pay | Admitting: Family Medicine

## 2019-03-09 ENCOUNTER — Other Ambulatory Visit: Payer: Self-pay

## 2019-03-09 ENCOUNTER — Ambulatory Visit: Payer: Self-pay | Attending: Family Medicine | Admitting: Family Medicine

## 2019-03-09 DIAGNOSIS — I10 Essential (primary) hypertension: Secondary | ICD-10-CM

## 2019-03-09 DIAGNOSIS — E7800 Pure hypercholesterolemia, unspecified: Secondary | ICD-10-CM

## 2019-03-09 DIAGNOSIS — K219 Gastro-esophageal reflux disease without esophagitis: Secondary | ICD-10-CM

## 2019-03-09 DIAGNOSIS — E1140 Type 2 diabetes mellitus with diabetic neuropathy, unspecified: Secondary | ICD-10-CM

## 2019-03-09 MED ORDER — AMLODIPINE BESYLATE 10 MG PO TABS: 10.0000 mg | ORAL_TABLET | Freq: Every day | ORAL | 1 refills | Status: DC

## 2019-03-09 MED ORDER — PANTOPRAZOLE SODIUM 40 MG PO TBEC: 40.0000 mg | DELAYED_RELEASE_TABLET | Freq: Every day | ORAL | 1 refills | Status: DC

## 2019-03-09 MED ORDER — METFORMIN HCL 500 MG PO TABS: ORAL_TABLET | ORAL | 1 refills | Status: DC

## 2019-03-09 MED ORDER — ATORVASTATIN CALCIUM 40 MG PO TABS: ORAL_TABLET | ORAL | 1 refills | Status: DC

## 2019-03-09 MED ORDER — GLIPIZIDE 10 MG PO TABS: 10.0000 mg | ORAL_TABLET | Freq: Two times a day (BID) | ORAL | 1 refills | Status: DC

## 2019-03-09 MED ORDER — HYDROCHLOROTHIAZIDE 25 MG PO TABS: 25.0000 mg | ORAL_TABLET | Freq: Every day | ORAL | 1 refills | Status: DC

## 2019-03-09 MED ORDER — LISINOPRIL 40 MG PO TABS: 40.0000 mg | ORAL_TABLET | Freq: Every day | ORAL | 1 refills | Status: DC

## 2019-03-09 MED FILL — ?GLIPIZIDE 10 MG TABLET: 10 | 90 days supply | Qty: 180 | Fill #0

## 2019-03-09 MED FILL — LISINOPRIL 40 MG TAB: 40 | 90 days supply | Qty: 90 | Fill #0

## 2019-03-09 MED FILL — ?HYDROCHLOROTHIAZIDE 25MG T: 25 | 90 days supply | Qty: 90 | Fill #0

## 2019-03-09 MED FILL — ?PANTOPRAZOLE SO DR 40MG TA: 40 | 90 days supply | Qty: 90 | Fill #0

## 2019-03-09 MED FILL — ?METFORMIN HCL 500MG TABLET: 500 | 90 days supply | Qty: 360 | Fill #0

## 2019-03-09 MED FILL — ?ATORVASTATIN 40MG TABLET: 40 | 90 days supply | Qty: 90 | Fill #0

## 2019-03-09 MED FILL — ?AMLODIPINE BESYLATE 10 MG: 10 | 90 days supply | Qty: 90 | Fill #0

## 2019-03-09 NOTE — Progress Notes (Signed)
Patient has been called and DOB has been verified. Patient has been screened and transferred to PCP to start phone visit.     

## 2019-03-09 NOTE — Progress Notes (Signed)
Virtual Visit via Telephone Note  I connected with Tina Gomez, on 03/09/2019 at 2:40 PM by telephone due to the COVID-19 pandemic and verified that I am speaking with the correct person using two identifiers.   Consent: I discussed the limitations, risks, security and privacy concerns of performing an evaluation and management service by telephone and the availability of in person appointments. I also discussed with the patient that there may be a patient responsible charge related to this service. The patient expressed understanding and agreed to proceed.   Location of Patient: Home  Location of Provider: Clinic   Persons participating in Telemedicine visit: Sara J Cree Napoli Farrington-CMA Dr. Felecia Shelling     History of Present Illness: Tina Gomez is a 63 year old female with a history of hypertension, hyperlipidemia, type 2 diabetes mellitus (A1c 8.8), GERD who comes into the clinic for follow-up visit. She is eating more healthy meals as her daughter is currently working from home and cooking for her.  Her fasting sugars have ranged between 101 115 and her highest random sugar was 170.  She denies hypoglycemia, numbness in extremities or visual concerns.  She does not exercise much. Due for an eye exam but due to lack of medical coverage is yet to follow through with this.  Reflux symptoms are controlled on her PPI; she endorses compliance with her antihypertensive and her statin and has no adverse effects of her medications. With regards to healthcare maintenance she is due for mammogram and colonoscopy but would like to put this off as she is trying to avoid going to doctors offices but promises to revisit this at her next in person office visit.  Past Medical History:  Diagnosis Date  . Hypertension   . Stroke [facility])    Allergies  Allergen Reactions  . Codeine Nausea And Vomiting    Current Outpatient Medications on File Prior to Visit  Medication Sig  Dispense Refill  . amLODipine (NORVASC) 10 MG tablet Take 1 tablet (10 mg total) by mouth daily. 90 tablet 1  . aspirin 325 MG tablet Take 1 tablet (325 mg total) by mouth daily. 90 tablet 1  . atorvastatin (LIPITOR) 40 MG tablet TAKE 1 TABLET BY MOUTH DAILY AT 6 PM. 90 tablet 1  . Blood Glucose Monitoring Suppl (BLOOD GLUCOSE METER) kit Use as instructed 1 each 0  . Blood Glucose Monitoring Suppl (TRUE METRIX METER) DEVI 1 each by Does not apply route 3 (three) times daily before meals. 1 Device 0  . cetirizine (ZYRTEC) 10 MG tablet Take 1 tablet (10 mg total) by mouth daily. 30 tablet 1  . glipiZIDE (GLUCOTROL) 10 MG tablet Take 1 tablet (10 mg total) by mouth 2 (two) times daily before a meal. 180 tablet 1  . glucose blood (TRUETEST TEST) test strip Use as instructed 100 each 12  . hydrochlorothiazide (HYDRODIURIL) 25 MG tablet Take 1 tablet (25 mg total) by mouth daily. 90 tablet 1  . Lancets (FREESTYLE) lancets Use as instructed 100 each 12  . lisinopril (PRINIVIL,ZESTRIL) 40 MG tablet Take 1 tablet (40 mg total) by mouth daily. 90 tablet 1  . metFORMIN (GLUCOPHAGE) 500 MG tablet TAKE 2 TABLETS BY MOUTH 2 TIMES DAILY WITH A MEAL. 360 tablet 1  . pantoprazole (PROTONIX) 40 MG tablet Take 1 tablet (40 mg total) by mouth daily. 90 tablet 1  . TRUEPLUS LANCETS 28G MISC 1 each by Does not apply route 3 (three) times daily before meals. 100 each 12  .  Olopatadine HCl (PATADAY) 0.2 % SOLN Apply 1 drop to eye daily. (Patient not taking: Reported on 12/03/2017) 2.5 mL 1  . triamcinolone cream (KENALOG) 0.1 % Apply 1 application topically 2 (two) times daily. (Patient not taking: Reported on 11/25/2018) 30 g 0   No current facility-administered medications on file prior to visit.     Observations/Objective: Awake, alert, oriented x3 Not in acute distress  CMP Latest Ref Rng & Units 11/25/2018 03/10/2018 11/04/2017  Glucose 65 - 99 mg/dL 198(H) 236(H) 220(H)  BUN 8 - 27 mg/dL 36(H) 31(H) 27   Creatinine 0.57 - 1.00 mg/dL 1.79(H) 1.24(H) 1.40(H)  Sodium 134 - 144 mmol/L 141 138 140  Potassium 3.5 - 5.2 mmol/L 4.8 4.7 4.9  Chloride 96 - 106 mmol/L 102 103 99  CO2 20 - 29 mmol/L 23 20 23   Calcium 8.7 - 10.3 mg/dL 9.2 9.2 9.5  Total Protein 6.0 - 8.5 g/dL 6.3 6.5 7.1  Total Bilirubin 0.0 - 1.2 mg/dL <0.2 <0.2 0.2  Alkaline Phos 39 - 117 IU/L 140(H) 122(H) 134(H)  AST 0 - 40 IU/L 13 7 12   ALT 0 - 32 IU/L 12 6 12     Lipid Panel     Component Value Date/Time   CHOL 251 (H) 11/25/2018 1506   TRIG 272 (H) 11/25/2018 1506   HDL 44 11/25/2018 1506   CHOLHDL 5.7 (H) 11/25/2018 1506   CHOLHDL 4.8 11/13/2016 1018   VLDL 40 (H) 02/15/2016 0907   LDLCALC 153 (H) 11/25/2018 1506     Lab Results  Component Value Date   HGBA1C 8.8 (H) 11/25/2018    Assessment and Plan: 1. Pure hypercholesterolemia Uncontrolled  Low-cholesterol diet - atorvastatin (LIPITOR) 40 MG tablet; TAKE 1 TABLET BY MOUTH DAILY AT 6 PM.  Dispense: 90 tablet; Refill: 1  2. Type 2 diabetes mellitus with diabetic neuropathy, without long-term current use of insulin (HCC) Uncontrolled with A1c of 8.8 Recent blood sugar logs reveal improvement A1c due at next visit - metFORMIN (GLUCOPHAGE) 500 MG tablet; TAKE 2 TABLETS BY MOUTH 2 TIMES DAILY WITH A MEAL.  Dispense: 360 tablet; Refill: 1 - glipiZIDE (GLUCOTROL) 10 MG tablet; Take 1 tablet (10 mg total) by mouth 2 (two) times daily before a meal.  Dispense: 180 tablet; Refill: 1  3. Essential hypertension, benign Stable Counseled on blood pressure goal of less than 130/80, low-sodium, DASH diet, medication compliance, 150 minutes of moderate intensity exercise per week. Discussed medication compliance, adverse effects. - amLODipine (NORVASC) 10 MG tablet; Take 1 tablet (10 mg total) by mouth daily.  Dispense: 90 tablet; Refill: 1 - hydrochlorothiazide (HYDRODIURIL) 25 MG tablet; Take 1 tablet (25 mg total) by mouth daily.  Dispense: 90 tablet; Refill: 1 -  lisinopril (ZESTRIL) 40 MG tablet; Take 1 tablet (40 mg total) by mouth daily.  Dispense: 90 tablet; Refill: 1  4. Gastroesophageal reflux disease without esophagitis Stable - pantoprazole (PROTONIX) 40 MG tablet; Take 1 tablet (40 mg total) by mouth daily.  Dispense: 90 tablet; Refill: 1   Follow Up Instructions: 3 months   I discussed the assessment and treatment plan with the patient. The patient was provided an opportunity to ask questions and all were answered. The patient agreed with the plan and demonstrated an understanding of the instructions.   The patient was advised to call back or seek an in-person evaluation if the symptoms worsen or if the condition fails to improve as anticipated.     I provided 25 minutes total of non-face-to-face  time during this encounter including median intraservice time, reviewing previous notes, labs, imaging, medications, management and patient verbalized understanding.     Charlott Rakes, MD, FAAFP. [facility] and [facility], [place]   03/09/2019, 2:40 PM

## 2019-05-11 DIAGNOSIS — J449 Chronic obstructive pulmonary disease, unspecified: Secondary | ICD-10-CM

## 2019-05-11 HISTORY — DX: Chronic obstructive pulmonary disease, unspecified: J44.9

## 2019-06-15 ENCOUNTER — Other Ambulatory Visit: Payer: Self-pay

## 2019-06-15 ENCOUNTER — Ambulatory Visit: Payer: Self-pay | Attending: Family Medicine | Admitting: Family Medicine

## 2019-06-15 DIAGNOSIS — E1140 Type 2 diabetes mellitus with diabetic neuropathy, unspecified: Secondary | ICD-10-CM

## 2019-06-15 DIAGNOSIS — I10 Essential (primary) hypertension: Secondary | ICD-10-CM

## 2019-06-15 DIAGNOSIS — E7800 Pure hypercholesterolemia, unspecified: Secondary | ICD-10-CM

## 2019-06-15 MED ORDER — DULOXETINE HCL 60 MG PO CPEP: 60.0000 mg | ORAL_CAPSULE | Freq: Every day | ORAL | 1 refills | Status: DC

## 2019-06-15 NOTE — Progress Notes (Signed)
Virtual Visit via Telephone Note  I connected with Tina Gomez, on 06/15/2019 at 2:31 PM by telephone due to the COVID-19 pandemic and verified that I am speaking with the correct person using two identifiers.   Consent: I discussed the limitations, risks, security and privacy concerns of performing an evaluation and management service by telephone and the availability of in person appointments. I also discussed with the patient that there may be a patient responsible charge related to this service. The patient expressed understanding and agreed to proceed.   Location of Patient: Home  Location of Provider: Clinic   Persons participating in Telemedicine visit: Jailee J Ethyl Vila Farrington-CMA Dr. Felecia Shelling     History of Present Illness: Tina Gomez is a 63 year old female with a history of hypertension, hyperlipidemia, type 2 diabetes mellitus (A1c 8.8), GERD seen for follow-up visit. Fasting sugar was 121 today and she denies hypoglycemia.She has numbness in her feet and in her left fifth finger but it is not worsening. Previously on Gabapentin which she was unable to tolerate as it made her feel weird. Blood pressure was 161/79 today but this was taken a few minutes after she took her antihypertensive. Compliant with her antihypertensive and compliant with her statin.  She does not get much exercise as she cares for her adult child who is autistic. Denies chest pain, dyspnea, paroxysmal nocturnal dyspnea.  Past Medical History:  Diagnosis Date  . Hypertension   . Stroke [facility])    Allergies  Allergen Reactions  . Codeine Nausea And Vomiting    Current Outpatient Medications on File Prior to Visit  Medication Sig Dispense Refill  . amLODipine (NORVASC) 10 MG tablet Take 1 tablet (10 mg total) by mouth daily. 90 tablet 1  . aspirin 325 MG tablet Take 1 tablet (325 mg total) by mouth daily. 90 tablet 1  . atorvastatin (LIPITOR) 40 MG tablet TAKE 1 TABLET BY MOUTH  DAILY AT 6 PM. 90 tablet 1  . Blood Glucose Monitoring Suppl (BLOOD GLUCOSE METER) kit Use as instructed 1 each 0  . Blood Glucose Monitoring Suppl (TRUE METRIX METER) DEVI 1 each by Does not apply route 3 (three) times daily before meals. 1 Device 0  . cetirizine (ZYRTEC) 10 MG tablet Take 1 tablet (10 mg total) by mouth daily. 30 tablet 1  . glipiZIDE (GLUCOTROL) 10 MG tablet Take 1 tablet (10 mg total) by mouth 2 (two) times daily before a meal. 180 tablet 1  . glucose blood (TRUETEST TEST) test strip Use as instructed 100 each 12  . hydrochlorothiazide (HYDRODIURIL) 25 MG tablet Take 1 tablet (25 mg total) by mouth daily. 90 tablet 1  . Lancets (FREESTYLE) lancets Use as instructed 100 each 12  . lisinopril (ZESTRIL) 40 MG tablet Take 1 tablet (40 mg total) by mouth daily. 90 tablet 1  . metFORMIN (GLUCOPHAGE) 500 MG tablet TAKE 2 TABLETS BY MOUTH 2 TIMES DAILY WITH A MEAL. 360 tablet 1  . pantoprazole (PROTONIX) 40 MG tablet Take 1 tablet (40 mg total) by mouth daily. 90 tablet 1  . TRUEPLUS LANCETS 28G MISC 1 each by Does not apply route 3 (three) times daily before meals. 100 each 12  . Olopatadine HCl (PATADAY) 0.2 % SOLN Apply 1 drop to eye daily. (Patient not taking: Reported on 12/03/2017) 2.5 mL 1  . triamcinolone cream (KENALOG) 0.1 % Apply 1 application topically 2 (two) times daily. (Patient not taking: Reported on 11/25/2018) 30 g 0   No  current facility-administered medications on file prior to visit.     Observations/Objective: Awake, alert, oriented x3 Not in acute distress  CMP Latest Ref Rng & Units 11/25/2018 03/10/2018 11/04/2017  Glucose 65 - 99 mg/dL 198(H) 236(H) 220(H)  BUN 8 - 27 mg/dL 36(H) 31(H) 27  Creatinine 0.57 - 1.00 mg/dL 1.79(H) 1.24(H) 1.40(H)  Sodium 134 - 144 mmol/L 141 138 140  Potassium 3.5 - 5.2 mmol/L 4.8 4.7 4.9  Chloride 96 - 106 mmol/L 102 103 99  CO2 20 - 29 mmol/L 23 20 23   Calcium 8.7 - 10.3 mg/dL 9.2 9.2 9.5  Total Protein 6.0 - 8.5 g/dL 6.3  6.5 7.1  Total Bilirubin 0.0 - 1.2 mg/dL <0.2 <0.2 0.2  Alkaline Phos 39 - 117 IU/L 140(H) 122(H) 134(H)  AST 0 - 40 IU/L 13 7 12   ALT 0 - 32 IU/L 12 6 12     Lipid Panel     Component Value Date/Time   CHOL 251 (H) 11/25/2018 1506   TRIG 272 (H) 11/25/2018 1506   HDL 44 11/25/2018 1506   CHOLHDL 5.7 (H) 11/25/2018 1506   CHOLHDL 4.8 11/13/2016 1018   VLDL 40 (H) 02/15/2016 0907   LDLCALC 153 (H) 11/25/2018 1506   LABVLDL 54 (H) 11/25/2018 1506    Lab Results  Component Value Date   HGBA1C 8.8 (H) 11/25/2018    Assessment and Plan: 1. Type 2 diabetes mellitus with diabetic neuropathy, without long-term current use of insulin (HCC) Uncontrolled with A1c of 8.8 She will need an A1c and this has been ordered and regimen will be adjusted accordingly Blood sugars reveal improvement Previously unable to tolerate gabapentin for neuropathy Commenced Cymbalta Counseled on Diabetic diet, my plate method, 614 minutes of moderate intensity exercise/week Blood sugar logs with fasting goals of 80-120 mg/dl, random of less than 180 and in the event of sugars less than 60 mg/dl or greater than 400 mg/dl encouraged to notify the clinic. Advised on the need for annual eye exams, annual foot exams, Pneumonia vaccine. - CMP14+EGFR; Future - Lipid panel; Future - Microalbumin / creatinine urine ratio; Future - Hemoglobin A1c; Future - DULoxetine (CYMBALTA) 60 MG capsule; Take 1 capsule (60 mg total) by mouth daily.  Dispense: 90 capsule; Refill: 1  2. Essential hypertension, benign Uncontrolled She insists blood pressure has been controlled previously We will repeat blood pressure in person visit meanwhile continue current regimen Counseled on blood pressure goal of less than 130/80, low-sodium, DASH diet, medication compliance, 150 minutes of moderate intensity exercise per week. Discussed medication compliance, adverse effects.   3. Pure hypercholesterolemia Uncontrolled Lipid panel  ordered and will adjust regimen accordingly Continue atorvastatin Low-cholesterol diet   Follow Up Instructions: 3 months   I discussed the assessment and treatment plan with the patient. The patient was provided an opportunity to ask questions and all were answered. The patient agreed with the plan and demonstrated an understanding of the instructions.   The patient was advised to call back or seek an in-person evaluation if the symptoms worsen or if the condition fails to improve as anticipated.     I provided 15 minutes total of non-face-to-face time during this encounter including median intraservice time, reviewing previous notes, labs, imaging, medications, management and patient verbalized understanding.     Charlott Rakes, MD, FAAFP. [facility] and [place], [place]   06/15/2019, 2:31 PM

## 2019-06-15 NOTE — Progress Notes (Signed)
Patient has been called and DOB has been verified. Patient has been screened and transferred to PCP to start phone visit.     

## 2019-06-22 ENCOUNTER — Other Ambulatory Visit: Payer: Self-pay

## 2019-06-22 MED FILL — PANTOPRAZOLE SOD DR 40 MG T: 40 | 30 days supply | Qty: 30 | Fill #1

## 2019-06-22 MED FILL — AMLODIPINE BESYLATE 10 MG T: 10 | 30 days supply | Qty: 30 | Fill #1

## 2019-06-22 MED FILL — HYDROCHLOROTHIAZIDE 25 MG T: 25 | 30 days supply | Qty: 30 | Fill #1

## 2019-06-22 MED FILL — metFORMIN HCL 500 MG TABS: 500 | 30 days supply | Qty: 120 | Fill #1

## 2019-06-22 MED FILL — ATORVASTATIN CALCIUM 40 MG: 40 | 30 days supply | Qty: 30 | Fill #1

## 2019-06-22 MED FILL — glipiZIDE 10 MG TABS: 10 | 30 days supply | Qty: 60 | Fill #1

## 2019-06-22 MED FILL — LISINOPRIL 40 MG TABLET: 40 | 30 days supply | Qty: 30 | Fill #1

## 2019-11-08 MED FILL — LISINOPRIL 40 MG TABLET: 40 | 30 days supply | Qty: 30 | Fill #2

## 2019-11-08 MED FILL — PANTOPRAZOLE SOD DR 40 MG T: 40 | 30 days supply | Qty: 30 | Fill #2

## 2019-11-08 MED FILL — HYDROCHLOROTHIAZIDE 25 MG T: 25 | 30 days supply | Qty: 30 | Fill #2

## 2019-11-08 MED FILL — AMLODIPINE BESYLATE 10 MG T: 10 | 30 days supply | Qty: 30 | Fill #2

## 2020-01-05 MED FILL — PANTOPRAZOLE SOD DR 40 MG T: 40 | 30 days supply | Qty: 30 | Fill #3

## 2020-01-05 MED FILL — AMLODIPINE BESYLATE 10 MG T: 10 | 30 days supply | Qty: 30 | Fill #3

## 2020-01-05 MED FILL — HYDROCHLOROTHIAZIDE 25 MG T: 25 | 30 days supply | Qty: 30 | Fill #3

## 2020-01-05 MED FILL — LISINOPRIL 40 MG TABLET: 40 | 30 days supply | Qty: 30 | Fill #3

## 2020-01-05 MED FILL — ATORVASTATIN CALCIUM 40 MG: 40 | 30 days supply | Qty: 30 | Fill #2

## 2020-05-23 ENCOUNTER — Encounter: Payer: Self-pay | Admitting: Family Medicine

## 2020-05-23 ENCOUNTER — Other Ambulatory Visit: Payer: Self-pay

## 2020-05-23 ENCOUNTER — Ambulatory Visit: Payer: Medicaid Other | Attending: Family Medicine | Admitting: Family Medicine

## 2020-05-23 VITALS — BP 200/71 | HR 82 | Ht 62.0 in | Wt 192.0 lb

## 2020-05-23 DIAGNOSIS — Z1231 Encounter for screening mammogram for malignant neoplasm of breast: Secondary | ICD-10-CM

## 2020-05-23 DIAGNOSIS — E7800 Pure hypercholesterolemia, unspecified: Secondary | ICD-10-CM

## 2020-05-23 DIAGNOSIS — R112 Nausea with vomiting, unspecified: Secondary | ICD-10-CM | POA: Diagnosis not present

## 2020-05-23 DIAGNOSIS — E1140 Type 2 diabetes mellitus with diabetic neuropathy, unspecified: Secondary | ICD-10-CM

## 2020-05-23 DIAGNOSIS — Z1211 Encounter for screening for malignant neoplasm of colon: Secondary | ICD-10-CM

## 2020-05-23 DIAGNOSIS — I10 Essential (primary) hypertension: Secondary | ICD-10-CM | POA: Diagnosis not present

## 2020-05-23 DIAGNOSIS — Z1159 Encounter for screening for other viral diseases: Secondary | ICD-10-CM

## 2020-05-23 LAB — POCT GLYCOSYLATED HEMOGLOBIN (HGB A1C): HbA1c, POC (controlled diabetic range): 7.6 % — AB (ref 0.0–7.0)

## 2020-05-23 LAB — GLUCOSE, POCT (MANUAL RESULT ENTRY): POC Glucose: 196 mg/dL — AB (ref 70–99)

## 2020-05-23 MED ORDER — DULOXETINE HCL 60 MG PO CPEP: 60.0000 mg | ORAL_CAPSULE | Freq: Every day | ORAL | 1 refills | Status: DC

## 2020-05-23 MED ORDER — METFORMIN HCL 500 MG PO TABS: ORAL_TABLET | ORAL | 1 refills | Status: DC

## 2020-05-23 MED ORDER — CARVEDILOL 3.125 MG PO TABS: 3.1250 mg | ORAL_TABLET | Freq: Two times a day (BID) | ORAL | 1 refills | Status: DC

## 2020-05-23 MED ORDER — LISINOPRIL-HYDROCHLOROTHIAZIDE 20-12.5 MG PO TABS: 2.0000 | ORAL_TABLET | Freq: Every day | ORAL | 1 refills | Status: DC

## 2020-05-23 MED ORDER — PROMETHAZINE HCL 25 MG RE SUPP: 25.0000 mg | Freq: Three times a day (TID) | RECTAL | 0 refills | Status: DC | PRN

## 2020-05-23 MED ORDER — AMLODIPINE BESYLATE 10 MG PO TABS: 10.0000 mg | ORAL_TABLET | Freq: Every day | ORAL | 1 refills | Status: DC

## 2020-05-23 MED ORDER — GLIPIZIDE 10 MG PO TABS: 10.0000 mg | ORAL_TABLET | Freq: Two times a day (BID) | ORAL | 1 refills | Status: DC

## 2020-05-23 MED ORDER — ATORVASTATIN CALCIUM 40 MG PO TABS: ORAL_TABLET | ORAL | 1 refills | Status: DC

## 2020-05-23 MED FILL — PROMETHAZINE HCL 25 MG SUPP: 25 | 6 days supply | Qty: 20 | Fill #0

## 2020-05-23 MED FILL — AMLODIPINE BESYLATE 10 MG T: 10 | 30 days supply | Qty: 30 | Fill #0

## 2020-05-23 MED FILL — glipiZIDE 10 MG TABS: 10 | 30 days supply | Qty: 60 | Fill #0

## 2020-05-23 MED FILL — ATORVASTATIN CALCIUM 40 MG: 40 | 30 days supply | Qty: 30 | Fill #0

## 2020-05-23 MED FILL — CARVEDILOL 3.125 MG TABLET: 3.125 | 30 days supply | Qty: 60 | Fill #0

## 2020-05-23 MED FILL — LISINOPRIL-HCTZ 20-12.5 MG: 20-12.5 | 30 days supply | Qty: 60 | Fill #0

## 2020-05-23 MED FILL — DULoxetine HCL 60 MG CPEP: 60 | 30 days supply | Qty: 30 | Fill #0

## 2020-05-23 MED FILL — METFORMIN HCL 500 MG TABS: 500 | 30 days supply | Qty: 120 | Fill #0

## 2020-05-23 NOTE — Progress Notes (Signed)
Patient states that she has been having nausea and throwing up.  She is not able to keep her medications down.

## 2020-05-23 NOTE — Progress Notes (Signed)
Subjective:  Patient ID: Tina Gomez, female    DOB: 07-May-1956  Age: 64 y.o. MRN: [id number]  CC: Diabetes   HPI Tina Gomez is a 64 year-old female withahistory of hypertension, hyperlipidemia, type 2 diabetes mellitus (A1c7.6), GERD seen for follow-up visit. Her last visit was a telehealth visit in 06/2019 but she endorses compliance with her medications.  She has been nauseous every morning and throws up within 15 minutes of waking up. She has bile in her vomit. Has been taking Protonix for excessive belching but discontinued it after she read about the long-term side effects of Protonix. She feels awful in the mornings due to her symptoms and she sometimes throws her medications back up. She usually cannot eat till about 2pm. Sometimes she wakes up in the middle of the night and throws up. This has been present for the last 3 to 4 months but she denies presence of headache, blurry vision, neck pain or stiffness She has no abdominal pain, no constipation, no excessive flatulence or burping.  Her blood pressure is significantly elevated and she endorses compliance with anxiety but again has been throwing up.  Unable to administer clonidine she will be driving back home and this could make her sedated. Diabetes been stable with no complaints of headaches or visual concerns  Past Medical History:  Diagnosis Date  . Hypertension   . Stroke [facility])     Past Surgical History:  Procedure Laterality Date  . CESAREAN SECTION      Family History  Problem Relation Age of Onset  . Stroke Mother   . Hypertension Mother   . Hyperlipidemia Mother   . Hypertension Father   . Hyperlipidemia Father     Allergies  Allergen Reactions  . Codeine Nausea And Vomiting    Outpatient Medications Prior to Visit  Medication Sig Dispense Refill  . aspirin 325 MG tablet Take 1 tablet (325 mg total) by mouth daily. 90 tablet 1  . Blood Glucose Monitoring Suppl (BLOOD GLUCOSE METER) kit Use  as instructed 1 each 0  . Blood Glucose Monitoring Suppl (TRUE METRIX METER) DEVI 1 each by Does not apply route 3 (three) times daily before meals. 1 Device 0  . cetirizine (ZYRTEC) 10 MG tablet Take 1 tablet (10 mg total) by mouth daily. 30 tablet 1  . glucose blood (TRUETEST TEST) test strip Use as instructed 100 each 12  . Lancets (FREESTYLE) lancets Use as instructed 100 each 12  . Olopatadine HCl (PATADAY) 0.2 % SOLN Apply 1 drop to eye daily. (Patient not taking: Reported on 12/03/2017) 2.5 mL 1  . pantoprazole (PROTONIX) 40 MG tablet Take 1 tablet (40 mg total) by mouth daily. 90 tablet 1  . triamcinolone cream (KENALOG) 0.1 % Apply 1 application topically 2 (two) times daily. (Patient not taking: Reported on 11/25/2018) 30 g 0  . TRUEPLUS LANCETS 28G MISC 1 each by Does not apply route 3 (three) times daily before meals. 100 each 12  . amLODipine (NORVASC) 10 MG tablet Take 1 tablet (10 mg total) by mouth daily. 90 tablet 1  . atorvastatin (LIPITOR) 40 MG tablet TAKE 1 TABLET BY MOUTH DAILY AT 6 PM. 90 tablet 1  . DULoxetine (CYMBALTA) 60 MG capsule Take 1 capsule (60 mg total) by mouth daily. 90 capsule 1  . glipiZIDE (GLUCOTROL) 10 MG tablet Take 1 tablet (10 mg total) by mouth 2 (two) times daily before a meal. 180 tablet 1  . hydrochlorothiazide (HYDRODIURIL) 25  MG tablet Take 1 tablet (25 mg total) by mouth daily. 90 tablet 1  . lisinopril (ZESTRIL) 40 MG tablet Take 1 tablet (40 mg total) by mouth daily. 90 tablet 1  . metFORMIN (GLUCOPHAGE) 500 MG tablet TAKE 2 TABLETS BY MOUTH 2 TIMES DAILY WITH A MEAL. 360 tablet 1   No facility-administered medications prior to visit.     ROS Review of Systems  Constitutional: Negative for activity change, appetite change and fatigue.  HENT: Negative for congestion, sinus pressure and sore throat.   Eyes: Negative for visual disturbance.  Respiratory: Negative for cough, chest tightness, shortness of breath and wheezing.   Cardiovascular:  Negative for chest pain and palpitations.  Gastrointestinal: Positive for nausea and vomiting. Negative for abdominal distention, abdominal pain and constipation.  Endocrine: Negative for polydipsia.  Genitourinary: Negative for dysuria and frequency.  Musculoskeletal: Negative for arthralgias and back pain.  Skin: Negative for rash.  Neurological: Negative for tremors, light-headedness and numbness.  Hematological: Does not bruise/bleed easily.  Psychiatric/Behavioral: Negative for agitation and behavioral problems.    Objective:  BP (!) 200/71   Pulse 82   Ht 5' 2"  (1.575 m)   Wt 192 lb (87.1 kg)   SpO2 97%   BMI 35.12 kg/m   BP/Weight 05/23/2020 4/82/5003 7/0/4888  Systolic BP 916 945 038  Diastolic BP 71 79 79  Wt. (Lbs) 192 215 221  BMI 35.12 39.32 40.42      Physical Exam Constitutional:      Appearance: She is well-developed.  Neck:     Vascular: No JVD.  Cardiovascular:     Rate and Rhythm: Normal rate.     Heart sounds: Normal heart sounds. No murmur heard.   Pulmonary:     Effort: Pulmonary effort is normal.     Breath sounds: Normal breath sounds. No wheezing or rales.  Chest:     Chest wall: No tenderness.  Abdominal:     General: Bowel sounds are normal. There is no distension.     Palpations: Abdomen is soft. There is no mass.     Tenderness: There is no abdominal tenderness.  Musculoskeletal:        General: Normal range of motion.     Right lower leg: No edema.     Left lower leg: No edema.  Neurological:     Mental Status: She is alert and oriented to person, place, and time.  Psychiatric:        Mood and Affect: Mood normal.     CMP Latest Ref Rng & Units 11/25/2018 03/10/2018 11/04/2017  Glucose 65 - 99 mg/dL 198(H) 236(H) 220(H)  BUN 8 - 27 mg/dL 36(H) 31(H) 27  Creatinine 0.57 - 1.00 mg/dL 1.79(H) 1.24(H) 1.40(H)  Sodium 134 - 144 mmol/L 141 138 140  Potassium 3.5 - 5.2 mmol/L 4.8 4.7 4.9  Chloride 96 - 106 mmol/L 102 103 99  CO2 20 -  29 mmol/L 23 20 23   Calcium 8.7 - 10.3 mg/dL 9.2 9.2 9.5  Total Protein 6.0 - 8.5 g/dL 6.3 6.5 7.1  Total Bilirubin 0.0 - 1.2 mg/dL <0.2 <0.2 0.2  Alkaline Phos 39 - 117 IU/L 140(H) 122(H) 134(H)  AST 0 - 40 IU/L 13 7 12   ALT 0 - 32 IU/L 12 6 12     Lipid Panel     Component Value Date/Time   CHOL 251 (H) 11/25/2018 1506   TRIG 272 (H) 11/25/2018 1506   HDL 44 11/25/2018 1506   CHOLHDL 5.7 (  H) 11/25/2018 1506   CHOLHDL 4.8 11/13/2016 1018   VLDL 40 (H) 02/15/2016 0907   LDLCALC 153 (H) 11/25/2018 1506    CBC    Component Value Date/Time   WBC 8.8 10/30/2013 1202   RBC 4.47 10/30/2013 1202   HGB 13.1 10/30/2013 1202   HCT 38.4 10/30/2013 1202   PLT 250 10/30/2013 1202   MCV 85.9 10/30/2013 1202   MCH 29.3 10/30/2013 1202   MCHC 34.1 10/30/2013 1202   RDW 13.3 10/30/2013 1202   LYMPHSABS 1.8 10/30/2013 1202   MONOABS 0.8 10/30/2013 1202   EOSABS 0.1 10/30/2013 1202   BASOSABS 0.0 10/30/2013 1202    Lab Results  Component Value Date   HGBA1C 7.6 (A) 05/23/2020    Assessment & Plan:  1. Type 2 diabetes mellitus with diabetic neuropathy, without long-term current use of insulin (HCC) A1c of 7.6., goal is <7.0 No regimen change today Counseled on Diabetic diet, my plate method, 808 minutes of moderate intensity exercise/week Blood sugar logs with fasting goals of 80-120 mg/dl, random of less than 180 and in the event of sugars less than 60 mg/dl or greater than 400 mg/dl encouraged to notify the clinic. Advised on the need for annual eye exams, annual foot exams, Pneumonia vaccine. - POCT glucose (manual entry) - POCT glycosylated hemoglobin (Hb A1C) - Microalbumin / creatinine urine ratio - DULoxetine (CYMBALTA) 60 MG capsule; Take 1 capsule (60 mg total) by mouth daily.  Dispense: 90 capsule; Refill: 1 - glipiZIDE (GLUCOTROL) 10 MG tablet; Take 1 tablet (10 mg total) by mouth 2 (two) times daily before a meal.  Dispense: 180 tablet; Refill: 1 - metFORMIN  (GLUCOPHAGE) 500 MG tablet; TAKE 2 TABLETS BY MOUTH 2 TIMES DAILY WITH A MEAL.  Dispense: 360 tablet; Refill: 1  2. Screening for colon cancer  - Fecal occult blood, imunochemical([facility])  3. Intractable vomiting with nausea, unspecified vomiting type Evaluate for Helicobacter Pylori If negative she will need an upper endoscopy;  will also need to exclude gastric outlet obstruction - H. pylori breath test - promethazine (PHENERGAN) 25 MG suppository; Place 1 suppository (25 mg total) rectally every 8 (eight) hours as needed for nausea or vomiting.  Dispense: 20 each; Refill: 0 - Ambulatory referral to Gastroenterology  4. Essential hypertension, benign Uncontrolled Hold off on administering clonidine due to the fact that she will be driving home and clonidine can be sedating. Carvedilol added to regimen, consider increasing dose if blood pressure remains located at next visit Counseled on blood pressure goal of less than 130/80, low-sodium, DASH diet, medication compliance, 150 minutes of moderate intensity exercise per week. Discussed medication compliance, adverse effects. - lisinopril-hydrochlorothiazide (ZESTORETIC) 20-12.5 MG tablet; Take 2 tablets by mouth daily.  Dispense: 180 tablet; Refill: 1 - amLODipine (NORVASC) 10 MG tablet; Take 1 tablet (10 mg total) by mouth daily.  Dispense: 90 tablet; Refill: 1 - CMP14+EGFR - carvedilol (COREG) 3.125 MG tablet; Take 1 tablet (3.125 mg total) by mouth 2 (two) times daily with a meal.  Dispense: 180 tablet; Refill: 1  5. Pure hypercholesterolemia Uncontrolled She is due for lipid panel which I will order at her next visit - atorvastatin (LIPITOR) 40 MG tablet; TAKE 1 TABLET BY MOUTH DAILY AT 6 PM.  Dispense: 90 tablet; Refill: 1  6. Screening for viral disease - HIV Antibody (routine testing w rflx)  7. Encounter for screening mammogram for malignant neoplasm of breast - MM 3D SCREEN BREAST BILATERAL; Future   Health Care  Maintenance: Flu vaccine administered today Meds ordered this encounter  Medications  . promethazine (PHENERGAN) 25 MG suppository    Sig: Place 1 suppository (25 mg total) rectally every 8 (eight) hours as needed for nausea or vomiting.    Dispense:  20 each    Refill:  0  . lisinopril-hydrochlorothiazide (ZESTORETIC) 20-12.5 MG tablet    Sig: Take 2 tablets by mouth daily.    Dispense:  180 tablet    Refill:  1    Discontinue lisinopril, discontinue the HCTZ  . amLODipine (NORVASC) 10 MG tablet    Sig: Take 1 tablet (10 mg total) by mouth daily.    Dispense:  90 tablet    Refill:  1  . atorvastatin (LIPITOR) 40 MG tablet    Sig: TAKE 1 TABLET BY MOUTH DAILY AT 6 PM.    Dispense:  90 tablet    Refill:  1    Discontinue previous dose  . DULoxetine (CYMBALTA) 60 MG capsule    Sig: Take 1 capsule (60 mg total) by mouth daily.    Dispense:  90 capsule    Refill:  1  . glipiZIDE (GLUCOTROL) 10 MG tablet    Sig: Take 1 tablet (10 mg total) by mouth 2 (two) times daily before a meal.    Dispense:  180 tablet    Refill:  1  . metFORMIN (GLUCOPHAGE) 500 MG tablet    Sig: TAKE 2 TABLETS BY MOUTH 2 TIMES DAILY WITH A MEAL.    Dispense:  360 tablet    Refill:  1  . carvedilol (COREG) 3.125 MG tablet    Sig: Take 1 tablet (3.125 mg total) by mouth 2 (two) times daily with a meal.    Dispense:  180 tablet    Refill:  1    Follow-up: Return in about 1 month (around 06/22/2020) for for GI symptoms.       Charlott Rakes, MD, FAAFP. [facility] and [place], [place]   05/23/2020, 5:00 PM

## 2020-05-24 ENCOUNTER — Inpatient Hospital Stay (HOSPITAL_COMMUNITY): Payer: Medicaid Other

## 2020-05-24 ENCOUNTER — Emergency Department (HOSPITAL_COMMUNITY): Payer: Medicaid Other

## 2020-05-24 ENCOUNTER — Inpatient Hospital Stay (HOSPITAL_COMMUNITY)
Admission: EM | Admit: 2020-05-24 | Discharge: 2020-06-01 | DRG: 391 | Disposition: A | Discharge status: Home / Self Care | Payer: Medicaid Other | Attending: Internal Medicine | Admitting: Internal Medicine

## 2020-05-24 ENCOUNTER — Other Ambulatory Visit: Payer: Self-pay

## 2020-05-24 DIAGNOSIS — R933 Abnormal findings on diagnostic imaging of other parts of digestive tract: Secondary | ICD-10-CM | POA: Diagnosis not present

## 2020-05-24 DIAGNOSIS — Z83438 Family history of other disorder of lipoprotein metabolism and other lipidemia: Secondary | ICD-10-CM | POA: Diagnosis not present

## 2020-05-24 DIAGNOSIS — N179 Acute kidney failure, unspecified: Secondary | ICD-10-CM | POA: Diagnosis present

## 2020-05-24 DIAGNOSIS — E860 Dehydration: Secondary | ICD-10-CM | POA: Diagnosis present

## 2020-05-24 DIAGNOSIS — K5732 Diverticulitis of large intestine without perforation or abscess without bleeding: Secondary | ICD-10-CM | POA: Diagnosis present

## 2020-05-24 DIAGNOSIS — I129 Hypertensive chronic kidney disease with stage 1 through stage 4 chronic kidney disease, or unspecified chronic kidney disease: Secondary | ICD-10-CM | POA: Diagnosis present

## 2020-05-24 DIAGNOSIS — E43 Unspecified severe protein-calorie malnutrition: Secondary | ICD-10-CM | POA: Diagnosis present

## 2020-05-24 DIAGNOSIS — I69351 Hemiplegia and hemiparesis following cerebral infarction affecting right dominant side: Secondary | ICD-10-CM | POA: Diagnosis not present

## 2020-05-24 DIAGNOSIS — D509 Iron deficiency anemia, unspecified: Secondary | ICD-10-CM | POA: Diagnosis present

## 2020-05-24 DIAGNOSIS — R7989 Other specified abnormal findings of blood chemistry: Secondary | ICD-10-CM | POA: Diagnosis not present

## 2020-05-24 DIAGNOSIS — A048 Other specified bacterial intestinal infections: Secondary | ICD-10-CM | POA: Diagnosis not present

## 2020-05-24 DIAGNOSIS — E1165 Type 2 diabetes mellitus with hyperglycemia: Secondary | ICD-10-CM | POA: Diagnosis not present

## 2020-05-24 DIAGNOSIS — R160 Hepatomegaly, not elsewhere classified: Secondary | ICD-10-CM | POA: Diagnosis present

## 2020-05-24 DIAGNOSIS — F1021 Alcohol dependence, in remission: Secondary | ICD-10-CM | POA: Diagnosis present

## 2020-05-24 DIAGNOSIS — R197 Diarrhea, unspecified: Secondary | ICD-10-CM | POA: Diagnosis not present

## 2020-05-24 DIAGNOSIS — N170 Acute kidney failure with tubular necrosis: Secondary | ICD-10-CM | POA: Diagnosis not present

## 2020-05-24 DIAGNOSIS — Z7984 Long term (current) use of oral hypoglycemic drugs: Secondary | ICD-10-CM

## 2020-05-24 DIAGNOSIS — E876 Hypokalemia: Secondary | ICD-10-CM | POA: Diagnosis present

## 2020-05-24 DIAGNOSIS — E785 Hyperlipidemia, unspecified: Secondary | ICD-10-CM | POA: Diagnosis not present

## 2020-05-24 DIAGNOSIS — K5792 Diverticulitis of intestine, part unspecified, without perforation or abscess without bleeding: Secondary | ICD-10-CM | POA: Diagnosis not present

## 2020-05-24 DIAGNOSIS — K219 Gastro-esophageal reflux disease without esophagitis: Secondary | ICD-10-CM | POA: Diagnosis present

## 2020-05-24 DIAGNOSIS — R112 Nausea with vomiting, unspecified: Secondary | ICD-10-CM | POA: Diagnosis present

## 2020-05-24 DIAGNOSIS — Z20822 Contact with and (suspected) exposure to covid-19: Secondary | ICD-10-CM | POA: Diagnosis present

## 2020-05-24 DIAGNOSIS — E1140 Type 2 diabetes mellitus with diabetic neuropathy, unspecified: Secondary | ICD-10-CM

## 2020-05-24 DIAGNOSIS — Z823 Family history of stroke: Secondary | ICD-10-CM

## 2020-05-24 DIAGNOSIS — D649 Anemia, unspecified: Secondary | ICD-10-CM | POA: Diagnosis present

## 2020-05-24 DIAGNOSIS — Z91048 Other nonmedicinal substance allergy status: Secondary | ICD-10-CM | POA: Diagnosis not present

## 2020-05-24 DIAGNOSIS — R1114 Bilious vomiting: Secondary | ICD-10-CM | POA: Diagnosis present

## 2020-05-24 DIAGNOSIS — B9681 Helicobacter pylori [H. pylori] as the cause of diseases classified elsewhere: Secondary | ICD-10-CM | POA: Diagnosis present

## 2020-05-24 DIAGNOSIS — Z801 Family history of malignant neoplasm of trachea, bronchus and lung: Secondary | ICD-10-CM

## 2020-05-24 DIAGNOSIS — Z885 Allergy status to narcotic agent status: Secondary | ICD-10-CM | POA: Diagnosis not present

## 2020-05-24 DIAGNOSIS — N184 Chronic kidney disease, stage 4 (severe): Secondary | ICD-10-CM | POA: Diagnosis present

## 2020-05-24 DIAGNOSIS — F17210 Nicotine dependence, cigarettes, uncomplicated: Secondary | ICD-10-CM | POA: Diagnosis present

## 2020-05-24 DIAGNOSIS — K3184 Gastroparesis: Secondary | ICD-10-CM | POA: Diagnosis not present

## 2020-05-24 DIAGNOSIS — J439 Emphysema, unspecified: Secondary | ICD-10-CM | POA: Diagnosis present

## 2020-05-24 DIAGNOSIS — Z79899 Other long term (current) drug therapy: Secondary | ICD-10-CM

## 2020-05-24 DIAGNOSIS — Z8673 Personal history of transient ischemic attack (TIA), and cerebral infarction without residual deficits: Secondary | ICD-10-CM | POA: Diagnosis not present

## 2020-05-24 DIAGNOSIS — Z720 Tobacco use: Secondary | ICD-10-CM | POA: Diagnosis not present

## 2020-05-24 DIAGNOSIS — K222 Esophageal obstruction: Secondary | ICD-10-CM | POA: Diagnosis present

## 2020-05-24 DIAGNOSIS — E1143 Type 2 diabetes mellitus with diabetic autonomic (poly)neuropathy: Secondary | ICD-10-CM | POA: Diagnosis not present

## 2020-05-24 DIAGNOSIS — I10 Essential (primary) hypertension: Secondary | ICD-10-CM | POA: Diagnosis present

## 2020-05-24 DIAGNOSIS — L409 Psoriasis, unspecified: Secondary | ICD-10-CM | POA: Diagnosis present

## 2020-05-24 DIAGNOSIS — K449 Diaphragmatic hernia without obstruction or gangrene: Secondary | ICD-10-CM | POA: Diagnosis present

## 2020-05-24 DIAGNOSIS — R1110 Vomiting, unspecified: Secondary | ICD-10-CM | POA: Diagnosis present

## 2020-05-24 DIAGNOSIS — Z7982 Long term (current) use of aspirin: Secondary | ICD-10-CM | POA: Diagnosis not present

## 2020-05-24 DIAGNOSIS — R609 Edema, unspecified: Secondary | ICD-10-CM | POA: Diagnosis not present

## 2020-05-24 DIAGNOSIS — Z8249 Family history of ischemic heart disease and other diseases of the circulatory system: Secondary | ICD-10-CM

## 2020-05-24 LAB — URINALYSIS, ROUTINE W REFLEX MICROSCOPIC
Bilirubin Urine: NEGATIVE
Glucose, UA: 50 mg/dL — AB
Hgb urine dipstick: NEGATIVE
Ketones, ur: NEGATIVE mg/dL
Leukocytes,Ua: NEGATIVE
Nitrite: NEGATIVE
Protein, ur: >=300 mg/dL — AB
Specific Gravity, Urine: 1.012 (ref 1.005–1.030)
pH: 6 (ref 5.0–8.0)

## 2020-05-24 LAB — CMP14+EGFR
ALT: 4 IU/L (ref 0–32)
AST: 4 IU/L (ref 0–40)
Albumin/Globulin Ratio: 1.1 — ABNORMAL LOW (ref 1.2–2.2)
Albumin: 3 g/dL — ABNORMAL LOW (ref 3.8–4.8)
Alkaline Phosphatase: 118 IU/L (ref 44–121)
BUN/Creatinine Ratio: 13 (ref 12–28)
BUN: 53 mg/dL — ABNORMAL HIGH (ref 8–27)
Bilirubin Total: <0.2 mg/dL (ref 0.0–1.2)
CO2: 25 mmol/L (ref 20–29)
Calcium: 7 mg/dL — ABNORMAL LOW (ref 8.7–10.3)
Chloride: 102 mmol/L (ref 96–106)
Creatinine, Ser: 4.01 mg/dL — ABNORMAL HIGH (ref 0.57–1.00)
GFR calc Af Amer: 13 mL/min/{1.73_m2} — ABNORMAL LOW (ref >59)
GFR calc non Af Amer: 11 mL/min/{1.73_m2} — ABNORMAL LOW (ref >59)
Globulin, Total: 2.8 g/dL (ref 1.5–4.5)
Glucose: 179 mg/dL — ABNORMAL HIGH (ref 65–99)
Potassium: 2.6 mmol/L — ABNORMAL LOW (ref 3.5–5.2)
Sodium: 144 mmol/L (ref 134–144)
Total Protein: 5.8 g/dL — ABNORMAL LOW (ref 6.0–8.5)

## 2020-05-24 LAB — CBC
HCT: 32.8 % — ABNORMAL LOW (ref 36.0–46.0)
Hemoglobin: 10.4 g/dL — ABNORMAL LOW (ref 12.0–15.0)
MCH: 27.5 pg (ref 26.0–34.0)
MCHC: 31.7 g/dL (ref 30.0–36.0)
MCV: 86.8 fL (ref 80.0–100.0)
Platelets: 440 10*3/uL — ABNORMAL HIGH (ref 150–400)
RBC: 3.78 MIL/uL — ABNORMAL LOW (ref 3.87–5.11)
RDW: 13.9 % (ref 11.5–15.5)
WBC: 16.5 10*3/uL — ABNORMAL HIGH (ref 4.0–10.5)
nRBC: 0 % (ref 0.0–0.2)

## 2020-05-24 LAB — COMPREHENSIVE METABOLIC PANEL
ALT: 8 U/L (ref 0–44)
AST: 11 U/L — ABNORMAL LOW (ref 15–41)
Albumin: 2.2 g/dL — ABNORMAL LOW (ref 3.5–5.0)
Alkaline Phosphatase: 91 U/L (ref 38–126)
Anion gap: 18 — ABNORMAL HIGH (ref 5–15)
BUN: 51 mg/dL — ABNORMAL HIGH (ref 8–23)
CO2: 22 mmol/L (ref 22–32)
Calcium: 6.7 mg/dL — ABNORMAL LOW (ref 8.9–10.3)
Chloride: 100 mmol/L (ref 98–111)
Creatinine, Ser: 4.2 mg/dL — ABNORMAL HIGH (ref 0.44–1.00)
GFR calc Af Amer: 12 mL/min — ABNORMAL LOW (ref >60)
GFR calc non Af Amer: 11 mL/min — ABNORMAL LOW (ref >60)
Glucose, Bld: 280 mg/dL — ABNORMAL HIGH (ref 70–99)
Potassium: 2.5 mmol/L — CL (ref 3.5–5.1)
Sodium: 140 mmol/L (ref 135–145)
Total Bilirubin: 0.4 mg/dL (ref 0.3–1.2)
Total Protein: 6.1 g/dL — ABNORMAL LOW (ref 6.5–8.1)

## 2020-05-24 LAB — CBG MONITORING, ED
Glucose-Capillary: 115 mg/dL — ABNORMAL HIGH (ref 70–99)
Glucose-Capillary: 130 mg/dL — ABNORMAL HIGH (ref 70–99)

## 2020-05-24 LAB — SODIUM, URINE, RANDOM: Sodium, Ur: 89 mmol/L

## 2020-05-24 LAB — SARS CORONAVIRUS 2 BY RT PCR (HOSPITAL ORDER, PERFORMED IN ~~LOC~~ HOSPITAL LAB): SARS Coronavirus 2: NEGATIVE

## 2020-05-24 LAB — LIPASE, BLOOD: Lipase: 26 U/L (ref 11–51)

## 2020-05-24 LAB — CREATININE, URINE, RANDOM: Creatinine, Urine: 23.86 mg/dL

## 2020-05-24 LAB — MAGNESIUM: Magnesium: 1.4 mg/dL — ABNORMAL LOW (ref 1.7–2.4)

## 2020-05-24 LAB — HIV ANTIBODY (ROUTINE TESTING W REFLEX): HIV Screen 4th Generation wRfx: NONREACTIVE

## 2020-05-24 MED ORDER — ATORVASTATIN CALCIUM 40 MG PO TABS
40.0000 mg | ORAL_TABLET | Freq: Every evening | ORAL | Status: DC
  Administered 2020-05-24 – 2020-05-31 (×7): 40 mg via ORAL
  Filled 2020-05-24 (×7): qty 1

## 2020-05-24 MED ORDER — METRONIDAZOLE IN NACL 5-0.79 MG/ML-% IV SOLN
500.0000 mg | Freq: Three times a day (TID) | INTRAVENOUS | Status: DC
  Administered 2020-05-25 – 2020-05-27 (×7): 500 mg via INTRAVENOUS
  Filled 2020-05-24 (×7): qty 100

## 2020-05-24 MED ORDER — INSULIN ASPART 100 UNIT/ML ~~LOC~~ SOLN
0.0000 [IU] | SUBCUTANEOUS | Status: DC
  Administered 2020-05-24: 1 [IU] via SUBCUTANEOUS
  Administered 2020-05-25: 2 [IU] via SUBCUTANEOUS
  Administered 2020-05-25 (×2): 1 [IU] via SUBCUTANEOUS
  Administered 2020-05-25 (×2): 2 [IU] via SUBCUTANEOUS
  Administered 2020-05-25 – 2020-05-26 (×3): 1 [IU] via SUBCUTANEOUS
  Administered 2020-05-26: 2 [IU] via SUBCUTANEOUS
  Administered 2020-05-27 (×2): 1 [IU] via SUBCUTANEOUS
  Administered 2020-05-27 – 2020-05-28 (×2): 2 [IU] via SUBCUTANEOUS
  Administered 2020-05-28: 1 [IU] via SUBCUTANEOUS
  Administered 2020-05-28: 2 [IU] via SUBCUTANEOUS
  Administered 2020-05-29 – 2020-06-01 (×5): 1 [IU] via SUBCUTANEOUS

## 2020-05-24 MED ORDER — DOCUSATE SODIUM 100 MG PO CAPS
100.0000 mg | ORAL_CAPSULE | Freq: Two times a day (BID) | ORAL | Status: DC
  Filled 2020-05-24: qty 1

## 2020-05-24 MED ORDER — METRONIDAZOLE IN NACL 5-0.79 MG/ML-% IV SOLN
500.0000 mg | Freq: Once | INTRAVENOUS | Status: AC
  Administered 2020-05-24: 500 mg via INTRAVENOUS
  Filled 2020-05-24: qty 100

## 2020-05-24 MED ORDER — DULOXETINE HCL 60 MG PO CPEP
60.0000 mg | ORAL_CAPSULE | Freq: Every day | ORAL | Status: DC
  Administered 2020-05-24 – 2020-06-01 (×9): 60 mg via ORAL
  Filled 2020-05-24 (×10): qty 1

## 2020-05-24 MED ORDER — MAGNESIUM SULFATE 2 GM/50ML IV SOLN
2.0000 g | Freq: Once | INTRAVENOUS | Status: AC
  Administered 2020-05-24: 2 g via INTRAVENOUS
  Filled 2020-05-24: qty 50

## 2020-05-24 MED ORDER — SODIUM CHLORIDE 0.9 % IV SOLN
2.0000 g | INTRAVENOUS | Status: DC
  Administered 2020-05-25 – 2020-05-28 (×4): 2 g via INTRAVENOUS
  Filled 2020-05-24 (×4): qty 20

## 2020-05-24 MED ORDER — SODIUM CHLORIDE 0.9% FLUSH
3.0000 mL | Freq: Two times a day (BID) | INTRAVENOUS | Status: DC
  Administered 2020-05-25 – 2020-05-31 (×13): 3 mL via INTRAVENOUS

## 2020-05-24 MED ORDER — ACETAMINOPHEN 650 MG RE SUPP: 650.0000 mg | Freq: Four times a day (QID) | RECTAL | Status: DC | PRN

## 2020-05-24 MED ORDER — POTASSIUM CHLORIDE 10 MEQ/100ML IV SOLN
10.0000 meq | INTRAVENOUS | Status: AC
  Administered 2020-05-24 – 2020-05-25 (×3): 10 meq via INTRAVENOUS
  Filled 2020-05-24 (×3): qty 100

## 2020-05-24 MED ORDER — ONDANSETRON HCL 4 MG PO TABS
4.0000 mg | ORAL_TABLET | Freq: Four times a day (QID) | ORAL | Status: DC | PRN
  Administered 2020-05-27: 4 mg via ORAL
  Filled 2020-05-24: qty 1

## 2020-05-24 MED ORDER — SODIUM CHLORIDE 0.9 % IV BOLUS
1000.0000 mL | Freq: Once | INTRAVENOUS | Status: AC
  Administered 2020-05-24: 1000 mL via INTRAVENOUS

## 2020-05-24 MED ORDER — CARVEDILOL 3.125 MG PO TABS
3.1250 mg | ORAL_TABLET | Freq: Two times a day (BID) | ORAL | Status: DC
  Administered 2020-05-25 – 2020-06-01 (×14): 3.125 mg via ORAL
  Filled 2020-05-24 (×16): qty 1

## 2020-05-24 MED ORDER — SODIUM CHLORIDE 0.9 % IV SOLN
2.0000 g | Freq: Once | INTRAVENOUS | Status: AC
  Administered 2020-05-24: 2 g via INTRAVENOUS
  Filled 2020-05-24: qty 20

## 2020-05-24 MED ORDER — ONDANSETRON HCL 4 MG/2ML IJ SOLN
4.0000 mg | Freq: Once | INTRAMUSCULAR | Status: AC
  Administered 2020-05-24: 4 mg via INTRAVENOUS
  Filled 2020-05-24: qty 2

## 2020-05-24 MED ORDER — SODIUM CHLORIDE 0.9 % IV SOLN: INTRAVENOUS | Status: AC

## 2020-05-24 MED ORDER — AMLODIPINE BESYLATE 10 MG PO TABS
10.0000 mg | ORAL_TABLET | Freq: Every day | ORAL | Status: DC
  Administered 2020-05-24 – 2020-06-01 (×9): 10 mg via ORAL
  Filled 2020-05-24 (×5): qty 1
  Filled 2020-05-24 (×2): qty 2
  Filled 2020-05-24 (×2): qty 1

## 2020-05-24 MED ORDER — SODIUM CHLORIDE 0.9 % IV SOLN: Freq: Once | INTRAVENOUS | Status: AC

## 2020-05-24 MED ORDER — ACETAMINOPHEN 325 MG PO TABS: 650.0000 mg | ORAL_TABLET | Freq: Four times a day (QID) | ORAL | Status: DC | PRN

## 2020-05-24 MED ORDER — HYDROMORPHONE HCL 1 MG/ML IJ SOLN: 0.5000 mg | INTRAMUSCULAR | Status: DC | PRN

## 2020-05-24 MED ORDER — ONDANSETRON HCL 4 MG/2ML IJ SOLN
4.0000 mg | Freq: Four times a day (QID) | INTRAMUSCULAR | Status: DC | PRN
  Administered 2020-05-25 – 2020-05-26 (×4): 4 mg via INTRAVENOUS
  Filled 2020-05-24 (×4): qty 2

## 2020-05-24 NOTE — ED Triage Notes (Signed)
Pt here from PCP, sent for further workup and tx of acute kidney injury and potassium 2.6 found in blood work yesterday. Pt has been vomiting x 3 months, worsening over the last month.

## 2020-05-24 NOTE — ED Provider Notes (Signed)
[place] EMERGENCY DEPARTMENT Provider Note   CSN: [id number] Arrival date & time: 05/24/20  1316     History Chief Complaint  Patient presents with  . Abnormal Lab  . Emesis    Jalaina COILA WARDELL is a 64 y.o. female with past medical history significant for hypertension, CVA who presents for evaluation of abnormal labs.  Patient states she has had emesis over the last 3 months.  Patient states in the beginning this started out with approximately 1 episode of emesis in the morning.  Patient states she thought this was due to the reflux.  Patient states over the last 3 months this has increased in severity and she has been unable to keep down little foods, p.o. liquids or medication.  Throws up as soon as 15 minutes of waking up.  Has been taking Protonix for excessive belching however discontinued it.  States she cannot normally eat until 2:58 PM in the afternoon due to emesis.  States she will wake up in the middle the night and throw up.  Occasionally has bilious emesis.  Will occasionally have abdominal pain however none currently.  Denies headache, lightheadedness, dizziness, chest pain, shortness of breath, dysuria, rashes, lesions.  No melena or bright red blood per rectum in stool.  Does state that her bowel movements have changed in consistency and they are "loose."  Does state that she sometimes throws up mucus.  Will intermittently have mucus in her stool. She has noted a 30-40 lbs weight loss over the last 3-4 months.  Patient had PCP visit yesterday.  They noted abnormalities in her lab work and she was sent here for further evaluation.  History obtained from patient, family in room and past medical records.  No interpreter is used.   On Lisinopril and Metformin at home.  HPI     Past Medical History:  Diagnosis Date  . Hypertension   . Stroke [facility])     Patient Active Problem List   Diagnosis Date Noted  . Psoriasis 02/14/2016  . Type 2 diabetes  mellitus with diabetic neuropathy, unspecified ([place]) 01/18/2014  . Essential hypertension, benign 01/18/2014  . GERD (gastroesophageal reflux disease) 01/18/2014  . Other and unspecified hyperlipidemia 01/18/2014  . CVA (cerebral infarction) 10/22/2013  . 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 CESAREAN SECTION       OB History   No obstetric history on file.     Family History  Problem Relation Age of Onset  . Stroke Mother   . Hypertension Mother   . Hyperlipidemia Mother   . Hypertension Father   . Hyperlipidemia Father     Social History   Tobacco Use  . Smoking status: Current Every Day Smoker    Packs/day: 0.25    Years: 10.00    Pack years: 2.50    Types: Cigarettes  . Smokeless tobacco: Never Used  . Tobacco comment: 5 cigs daily  Substance Use Topics  . Alcohol use: No  . Drug use: No    Home Medications Prior to Admission medications   Medication Sig Start Date End Date Taking? Authorizing Provider  amLODipine (NORVASC) 10 MG tablet Take 1 tablet (10 mg total) by mouth daily. 05/23/20  Yes Charlott Rakes, MD  aspirin 325 MG tablet Take 1 tablet (325 mg total) by mouth daily. 11/25/18  Yes McClung, Angela M, PA-C  atorvastatin (LIPITOR) 40 MG tablet TAKE 1 TABLET BY MOUTH DAILY AT 6  PM. Patient taking differently: Take 40 mg by mouth every evening.  05/23/20  Yes Charlott Rakes, MD  cetirizine (ZYRTEC) 10 MG tablet Take 1 tablet (10 mg total) by mouth daily. Patient taking differently: Take 10 mg by mouth daily as needed for allergies or rhinitis.  11/13/16  Yes Newlin, Charlane Ferretti, MD  metFORMIN (GLUCOPHAGE) 500 MG tablet TAKE 2 TABLETS BY MOUTH 2 TIMES DAILY WITH A MEAL. Patient taking differently: Take 1,000 mg by mouth 2 (two) times daily with a meal.  05/23/20  Yes Newlin, Enobong, MD  Blood Glucose Monitoring Suppl (BLOOD GLUCOSE METER) kit Use as instructed 11/01/13   Reyne Dumas, MD  Blood Glucose  Monitoring Suppl (TRUE METRIX METER) DEVI 1 each by Does not apply route 3 (three) times daily before meals. 03/10/18   Charlott Rakes, MD  carvedilol (COREG) 3.125 MG tablet Take 1 tablet (3.125 mg total) by mouth 2 (two) times daily with a meal. 05/23/20   Charlott Rakes, MD  DULoxetine (CYMBALTA) 60 MG capsule Take 1 capsule (60 mg total) by mouth daily. 05/23/20   Charlott Rakes, MD  glipiZIDE (GLUCOTROL) 10 MG tablet Take 1 tablet (10 mg total) by mouth 2 (two) times daily before a meal. 05/23/20   Charlott Rakes, MD  glucose blood (TRUETEST TEST) test strip Use as instructed 03/10/18   Charlott Rakes, MD  Lancets (FREESTYLE) lancets Use as instructed 11/01/13   Reyne Dumas, MD  lisinopril-hydrochlorothiazide (ZESTORETIC) 20-12.5 MG tablet Take 2 tablets by mouth daily. 05/23/20   Charlott Rakes, MD  Olopatadine HCl (PATADAY) 0.2 % SOLN Apply 1 drop to eye daily. Patient not taking: Reported on 05/24/2020 11/13/16   Charlott Rakes, MD  pantoprazole (PROTONIX) 40 MG tablet Take 1 tablet (40 mg total) by mouth daily. Patient not taking: Reported on 05/24/2020 03/09/19   Charlott Rakes, MD  promethazine (PHENERGAN) 25 MG suppository Place 1 suppository (25 mg total) rectally every 8 (eight) hours as needed for nausea or vomiting. 05/23/20   Charlott Rakes, MD  triamcinolone cream (KENALOG) 0.1 % Apply 1 application topically 2 (two) times daily. Patient not taking: Reported on 05/24/2020 02/14/16   Charlott Rakes, MD  TRUEPLUS LANCETS 28G MISC 1 each by Does not apply route 3 (three) times daily before meals. 03/10/18   Charlott Rakes, MD    Allergies    Codeine and Adhesive [tape]  Review of Systems   Review of Systems  Constitutional: Negative.   HENT: Negative.   Respiratory: Negative.   Cardiovascular: Negative.   Gastrointestinal: Positive for abdominal pain (Intermittent abd pain), nausea and vomiting. Negative for abdominal distention, anal bleeding, blood in stool, constipation, diarrhea  and rectal pain.  Genitourinary: Negative.   Musculoskeletal: Negative.   Skin: Negative.   Neurological: Negative.   All other systems reviewed and are negative.   Physical Exam Updated Vital Signs BP (!) 188/74   Pulse 73   Temp 99.6 F (37.6 C) (Oral)   Resp 17   Ht _0  (1.575 m)   SpO2 100%   BMI 35.12 kg/m   Physical Exam Vitals and nursing note reviewed.  Constitutional:      General: She is not in acute distress.    Appearance: She is well-developed. She is not ill-appearing, toxic-appearing or diaphoretic.  HENT:     Head: Normocephalic and atraumatic.     Nose: Nose normal.     Mouth/Throat:     Mouth: Mucous membranes are moist.  Eyes:     Pupils: Pupils are  equal, round, and reactive to light.  Cardiovascular:     Rate and Rhythm: Normal rate.     Pulses: Normal pulses.     Heart sounds: Normal heart sounds.  Pulmonary:     Effort: No respiratory distress.     Breath sounds: Normal breath sounds.  Abdominal:     General: Bowel sounds are normal. There is no distension.     Palpations: There is no mass.     Tenderness: There is no abdominal tenderness. There is no right CVA tenderness, left CVA tenderness, guarding or rebound.     Hernia: No hernia is present.  Musculoskeletal:        General: No swelling, tenderness, deformity or signs of injury. Normal range of motion.     Cervical back: Normal range of motion.     Comments: Trace pitting edema to mid shin.  Compartments soft.  No bony tenderness.  Moves all 4 extremities at difficulty.  Skin:    General: Skin is warm and dry.     Capillary Refill: Capillary refill takes less than 2 seconds.     Comments: No edema, erythema or warmth.  No fluctuance induration.  Neurological:     General: No focal deficit present.     Mental Status: She is alert and oriented to person, place, and time.     Comments: Cranial nerves II through XII grossly intact.  Ambulatory that ataxic gait.  Negative Romberg,  heel-to-shin, finger-nose     ED Results / Procedures / Treatments   Labs (all labs ordered are listed, but only abnormal results are displayed) Labs Reviewed  COMPREHENSIVE METABOLIC PANEL - Abnormal; Notable for the following components:      Result Value   Potassium 2.5 (*)    Glucose, Bld 280 (*)    BUN 51 (*)    Creatinine, Ser 4.20 (*)    Calcium 6.7 (*)    Total Protein 6.1 (*)    Albumin 2.2 (*)    AST 11 (*)    GFR calc non Af Amer 11 (*)    GFR calc Af Amer 12 (*)    Anion gap 18 (*)    All other components within normal limits  CBC - Abnormal; Notable for the following components:   WBC 16.5 (*)    RBC 3.78 (*)    Hemoglobin 10.4 (*)    HCT 32.8 (*)    Platelets 440 (*)    All other components within normal limits  URINALYSIS, ROUTINE W REFLEX MICROSCOPIC - Abnormal; Notable for the following components:   APPearance HAZY (*)    Glucose, UA 50 (*)    Protein, ur >=300 (*)    Bacteria, UA RARE (*)    All other components within normal limits  MAGNESIUM - Abnormal; Notable for the following components:   Magnesium 1.4 (*)    All other components within normal limits  CBG MONITORING, ED - Abnormal; Notable for the following components:   Glucose-Capillary 115 (*)    All other components within normal limits  SARS CORONAVIRUS 2 BY RT PCR (HOSPITAL ORDER, [place] [facility])  GASTROINTESTINAL PANEL BY PCR, STOOL (REPLACES STOOL CULTURE)  C DIFFICILE QUICK SCREEN W PCR REFLEX  LIPASE, BLOOD  BLOOD GAS, VENOUS  PHOSPHORUS  CK    EKG EKG Interpretation  Date/Time:  Wednesday May 24 2020 18:38:28 EDT Ventricular Rate:  66 PR Interval:    QRS Duration: 103 QT Interval:  454 QTC Calculation:  476 R Axis:   56 Text Interpretation: Sinus rhythm Prolonged PR interval Borderline T wave abnormalities No STEMI Confirmed by Octaviano Glow [phone]) on 05/24/2020 6:42:47 PM   Radiology CT Abdomen Pelvis Wo Contrast  Result Date:  05/24/2020 CLINICAL DATA:  Nausea, vomiting, greater than 40 pound weight loss, acute renal injury. EXAM: CT ABDOMEN AND PELVIS WITHOUT CONTRAST TECHNIQUE: Multidetector CT imaging of the abdomen and pelvis was performed following the standard protocol without IV contrast. COMPARISON:  None. FINDINGS: Lower chest: Lingular linear atelectasis. Paraseptal cystic changes of bilateral lower lobes. At least mild 2 vessel coronary artery calcifications. Hepatobiliary: The liver is enlarged measuring up to 20 cm. No focal liver abnormality is seen. No gallstones, gallbladder wall thickening, or biliary dilatation. Pancreas: Unremarkable. No pancreatic ductal dilatation or surrounding inflammatory changes. Spleen: Normal in size without focal abnormality. Adrenals/Urinary Tract: No adrenal nodule bilaterally. Bilateral kidneys enhance symmetrically. No hydronephrosis. No hydroureter. The urinary bladder is unremarkable. Stomach/Bowel: Stomach is within normal limits. Appendix appears normal. No evidence of small bowel wall thickening, distention, or inflammatory changes. There is mid sigmoid wall thickening with associated perisigmoid fat stranding and suggestion of been inflated diverticula open (7:91, 3:67). Scattered colonic diverticulosis that is more prominent within the sigmoid colon. The rectum is underdistended with suggestion of mid rectal wall thickening. No bowel obstruction. Vascular/Lymphatic: No abdominal aorta or iliac aneurysm. Moderate to severe atherosclerotic plaque of the aorta and its branches. No abdominal, pelvic, or inguinal lymphadenopathy. Reproductive: Uterus and bilateral adnexa are unremarkable. Other: No intraperitoneal free fluid. No intraperitoneal free gas. No organized fluid collection. Musculoskeletal: No abdominal wall hernia or abnormality No suspicious lytic or blastic osseous lesions. No acute displaced fracture. Multilevel degenerative changes of the spine. Degenerative changes of the  right hip. IMPRESSION: 1. Uncomplicated sigmoid diverticulosis with acute diverticulitis. No associated bowel perforation or abscess formation. Post antibiotic treatment and after resolution of inflammatory changes, recommend colonoscopy to evaluate for underlying malignancy given patient's symptoms. 2. Other imaging findings of potential clinical significance: Paraseptal cystic changes of bilateral lower lobes which may represent emphysema versus pulmonary fibrosis. Hepatomegaly. Electronically Signed   By: Iven Finn M.D.   On: 05/24/2020 18:49   DG Chest 2 View  Result Date: 05/24/2020 CLINICAL DATA:  Hypertension, emesis, loss of appetite with nausea EXAM: CHEST - 2 VIEW COMPARISON:  CT 04/29/2009, radiograph 10/22/2013 FINDINGS: Chronically coarsened interstitial changes and subpleural reticular opacities compatible with the extensive paraseptal and centrilobular emphysematous changes seen on comparison CT. These findings are overall quite similar to the comparison radiograph in 2015 with some persistent mild hyperinflation and increased AP diameter of the chest. No focal consolidative opacity, convincing features of edema, pneumothorax or visible effusion. The aorta is calcified. The remaining cardiomediastinal contours are unremarkable. No acute osseous or soft tissue abnormality. Telemetry leads overlie the chest. IMPRESSION: 1. Chronic hyperinflation, coarsened interstitial changes and subpleural reticular opacities compatible with the extensive paraseptal and centrilobular emphysematous changes seen on comparison studies and not significantly changed since 2015. 2. No acute cardiopulmonary disease. 3.  Aortic Atherosclerosis (ICD10-I70.0). Electronically Signed   By: Lovena Le M.D.   On: 05/24/2020 18:05    Procedures .Critical Care Performed by: Nettie Elm, PA-C Authorized by: Nettie Elm, PA-C   Critical care provider statement:    Critical care time (minutes):  45    Critical care was necessary to treat or prevent imminent or life-threatening deterioration of the following conditions:  Renal failure and metabolic crisis  Critical care was time spent personally by me on the following activities:  Discussions with consultants, evaluation of patient's response to treatment, examination of patient, ordering and performing treatments and interventions, ordering and review of laboratory studies, ordering and review of radiographic studies, pulse oximetry, re-evaluation of patient's condition, obtaining history from patient or surrogate and review of old charts   (including critical care time)  Medications Ordered in ED Medications  potassium chloride 10 mEq in 100 mL IVPB (10 mEq Intravenous New Bag/Given 05/24/20 1858)  cefTRIAXone (ROCEPHIN) 2 g in sodium chloride 0.9 % 100 mL IVPB (has no administration in time range)    And  metroNIDAZOLE (FLAGYL) IVPB 500 mg (has no administration in time range)  amLODipine (NORVASC) tablet 10 mg (has no administration in time range)  carvedilol (COREG) tablet 3.125 mg (has no administration in time range)  ondansetron (ZOFRAN) injection 4 mg (has no administration in time range)  0.9 %  sodium chloride infusion (has no administration in time range)  sodium chloride 0.9 % bolus 1,000 mL (1,000 mLs Intravenous New Bag/Given 05/24/20 1757)  magnesium sulfate IVPB 2 g 50 mL (0 g Intravenous Stopped 05/24/20 1845)    ED Course  I have reviewed the triage vital signs and the nursing notes.  Pertinent labs & imaging results that were available during my care of the patient were reviewed by me and considered in my medical decision making (see chart for details).  64 year old presents for evaluation of abnormal lab.  She is afebrile, nonseptic, non-ill-appearing.  Patient with persistent emesis over the last 3 months, worsening over the last few weeks.  She has been unable to keep down even liquids at home.  Has had some soft stools  over the last few weeks.  Has lost approximately 30 pounds over the last 3 months.  Her heart and lungs are clear.  Her abdomen is soft, nontender.  She does look dehydrated.  Seen by PCP or had labs taken was noted to have acute renal failure, hypokalemia, hypomagnesemia.  Patient sent to the emergency department for further evaluation.  She is on lisinopril and Metformin as well.  Labs and imaging personally reviewed and interpreted: CBC with leukocytosis at 16.5, hemoglobin 98.3 Metabolic panel with hypokalemia at 2.5, hyperglycemia to 80, creatinine 4.2, hypocalcemia at 6.7, GFR 11, anion gap 18  Lipase 26 Magnesium 1.4 VBG pending UA negative for infection, No ketonuria COVID negative EKG without STEMI, QtC 476 DG chest with emphysema  CT AP with acute diverticulitis without abscess or perforation.  Radiology does recommend follow-up colonoscopy to ensure no underlying malignancy.  Patient reassessed.  States she does not need anything for nausea at this time.  She is hypertensive however I have low suspicion for hypertensive urgency or emergency.  Will give some of her home blood pressure medication however will hold on lisinopril given her new AKI.  She does have elevated blood glucose at 280 however is not been able to take her diabetes medications.  We will continue to hold on Metformin as this can cause renal injury as well.  She was given IV fluids.  I have low suspicion for DKA.  Patient will need admission for acute renal failure as well as electrolyte abnormalities.  CONSULT with Dr. Roel Cluck with [facility] who will evaluate patient for admission.  Recommends adding GI panel, C. difficile panel, CT head, phosphorus.  The patient appears reasonably stabilized for admission considering the current resources, flow, and capabilities available in the ED at  this time, and I doubt any other [facility] requiring further screening and/or treatment in the ED prior to admission.  Clinical Course as of May 24 2006  Wed May 25, 4695  [id number] 64 year old female present emergency department with poor p.o. intake, persistent vomiting for several weeks.  She was referred in by her doctor that outpatient labs were concerned about an AKI and electrolyte derangement.  Here she is found of a creatinine level of 4.2, K 2.5, Mg 1.4, Ca 6.7, Alb 2.2, Anion gap of 18, Glucose 280.  She is receiving IV fluids, as well as IV potassium and IV magnesium.  CT scan of the abdomen showed sigmoid colon thickening concerning for any acute diverticulitis..  Plan for admission for electrolyte derangements and IV antibiotics.  Patient and her daughter updated and agreeable to staying.   [MT]    Clinical Course User Index [MT] Wyvonnia Dusky, MD   MDM Rules/Calculators/A&P                          Final Clinical Impression(s) / ED Diagnoses Final diagnoses:  AKI (acute kidney injury) ([place])  Nausea and vomiting, intractability of vomiting not specified, unspecified vomiting type  Diverticulitis  Hypokalemia  Hypomagnesemia  Hypocalcemia    Rx / DC Orders ED Discharge Orders    None       Laroy Mustard A, PA-C 05/24/20 2007    Wyvonnia Dusky, MD 05/24/20 2044

## 2020-05-24 NOTE — H&P (Signed)
Tina Gomez FFM:[id number] DOB: [birth year] DOA: 05/24/2020    PCP: Charlott Rakes, MD   Outpatient Specialists:   NONE   Patient arrived to ER on 05/24/20 at 1316 Referred by Attending Trifan, Carola Rhine, MD   Patient coming from: home Lives   With family   Chief Complaint:   Chief Complaint  Patient presents with  . Abnormal Lab  . Emesis    HPI: Tina Gomez is a 64 y.o. female with medical history significant of HTn, hx of CVA with right side weakness, psoriasis, DM type II, GERD, Tobacco abuse    Presented with   6-3 m hx of N/v sometimes loose stools, decreased Po intake She was seen by her PCP noted to have abnormal labs suh as Cr up to 40, low K and Mg and was told to come to ER. Patient was so worried about Covid she did not seek care earlier Patient has history of GERD and has been taking Protonix for this in the past but stopped because she read it could cause cancer.  She has been waking up in the middle of the night vomiting sometimes movements in the morning.  Occasionally vomiting is bilious.  No associated shortness of breath or chest pain no blood in stools or melena.  She has been having intermittent mucus in her stool.  She has lost up to 40 pounds in the past 4 months. Patient reports that she has been nauseous every morning and vomits as soon as she wakes up.  Family reports leg swelling left worse than right No hx of surgery on legs  Have been very cold  Infectious risk factors:  Reports  N/V/Diarrhea    severe fatigue    Has  been vaccinated against COVID    Initial COVID TEST  NEGATIVE   Lab Results  Component Value Date   [place] NEGATIVE 05/24/2020    Regarding pertinent Chronic problems:     Hyperlipidemia -  on statins Lipitor Lipid Panel     Component Value Date/Time   CHOL 251 (H) 11/25/2018 1506   TRIG 272 (H) 11/25/2018 1506   HDL 44 11/25/2018 1506   CHOLHDL 5.7 (H) 11/25/2018 1506   CHOLHDL 4.8 11/13/2016 1018   VLDL  40 (H) 02/15/2016 0907   LDLCALC 153 (H) 11/25/2018 1506   LABVLDL 54 (H) 11/25/2018 1506     HTN on Norvasc, Coreg    DM 2 -  Lab Results  Component Value Date   HGBA1C 7.6 (A) 05/23/2020    on   PO meds only, diet controlled    Hx of CVA -  With  residual deficits on Aspirin  375m   CKD stage III - baseline Cr 1.8    Lab Results  Component Value Date   CREATININE 4.20 (H) 05/24/2020   CREATININE 4.01 (H) 05/23/2020   CREATININE 1.79 (H) 11/25/2018      Chronic anemia - baseline hg Hemoglobin & Hematocrit  Recent Labs    05/24/20 1326  HGB 10.4*     While in ER: Noted to be in acute renal failure with creatinine up to 4.2 Low potassium down to 2.5 was replaced Low magnesium was replaced CT was worrisome for diverticulitis patient does have elevated white blood cell count start Rocephin and Flagyl Need to have outpatient follow-up and possible colonoscopies rule out malignancy   Hospitalist was called for admission for AKI dehydration  The following Work up has been ordered so far:  Orders Placed This Encounter  Procedures  . Critical Care  . SARS Coronavirus 2 by RT PCR (hospital order, performed in [facility] hospital lab) Nasopharyngeal Nasopharyngeal Swab  . Gastrointestinal Panel by PCR , Stool  . C Difficile Quick Screen w PCR reflex  . CT Abdomen Pelvis Wo Contrast  . DG Chest 2 View  . CT Head Wo Contrast  . Lipase, blood  . Comprehensive metabolic panel  . CBC  . Urinalysis, Routine w reflex microscopic  . Magnesium  . Blood gas, venous (at [facility] and [facility], not at [facility])  . Phosphorus  . CK  . Diet NPO time specified  . Consult for [place] Admission  ALL PATIENTS BEING ADMITTED/HAVING PROCEDURES NEED COVID-19 SCREENING  . Enteric precautions (UV disinfection)  . POC CBG, ED  . ED EKG  . EKG 12-Lead     Following Medications were ordered in ER: Medications  potassium chloride 10 mEq in 100 mL IVPB (10 mEq Intravenous New Bag/Given  05/24/20 1858)  cefTRIAXone (ROCEPHIN) 2 g in sodium chloride 0.9 % 100 mL IVPB (2 g Intravenous New Bag/Given 05/24/20 2016)    And  metroNIDAZOLE (FLAGYL) IVPB 500 mg (500 mg Intravenous New Bag/Given 05/24/20 2017)  amLODipine (NORVASC) tablet 10 mg (has no administration in time range)  carvedilol (COREG) tablet 3.125 mg (has no administration in time range)  0.9 %  sodium chloride infusion (has no administration in time range)  sodium chloride 0.9 % bolus 1,000 mL (1,000 mLs Intravenous New Bag/Given 05/24/20 1757)  magnesium sulfate IVPB 2 g 50 mL (0 g Intravenous Stopped 05/24/20 1845)  ondansetron (ZOFRAN) injection 4 mg (4 mg Intravenous Given 05/24/20 2014)     Significant initial  Findings: Abnormal Labs Reviewed  COMPREHENSIVE METABOLIC PANEL - Abnormal; Notable for the following components:      Result Value   Potassium 2.5 (*)    Glucose, Bld 280 (*)    BUN 51 (*)    Creatinine, Ser 4.20 (*)    Calcium 6.7 (*)    Total Protein 6.1 (*)    Albumin 2.2 (*)    AST 11 (*)    GFR calc non Af Amer 11 (*)    GFR calc Af Amer 12 (*)    Anion gap 18 (*)    All other components within normal limits  CBC - Abnormal; Notable for the following components:   WBC 16.5 (*)    RBC 3.78 (*)    Hemoglobin 10.4 (*)    HCT 32.8 (*)    Platelets 440 (*)    All other components within normal limits  URINALYSIS, ROUTINE W REFLEX MICROSCOPIC - Abnormal; Notable for the following components:   APPearance HAZY (*)    Glucose, UA 50 (*)    Protein, ur >=300 (*)    Bacteria, UA RARE (*)    All other components within normal limits  MAGNESIUM - Abnormal; Notable for the following components:   Magnesium 1.4 (*)    All other components within normal limits  CBG MONITORING, ED - Abnormal; Notable for the following components:   Glucose-Capillary 115 (*)    All other components within normal limits   Otherwise labs showing:    Recent Labs  Lab 05/23/20 1656 05/24/20 1326 05/24/20 1400  NA  144 140  --   K 2.6* 2.5*  --   CO2 25 22  --   GLUCOSE 179* 280*  --   BUN 53* 51*  --  CREATININE 4.01* 4.20*  --   CALCIUM 7.0* 6.7*  --   MG  --   --  1.4*    Cr  Up from baseline see below Lab Results  Component Value Date   CREATININE 4.20 (H) 05/24/2020   CREATININE 4.01 (H) 05/23/2020   CREATININE 1.79 (H) 11/25/2018    Recent Labs  Lab 05/23/20 1656 05/24/20 1326  AST 4 11*  ALT 4 8  ALKPHOS 118 91  BILITOT <0.2 0.4  PROT 5.8* 6.1*  ALBUMIN 3.0* 2.2*   Lab Results  Component Value Date   CALCIUM 6.7 (L) 05/24/2020     WBC       Component Value Date/Time   WBC 16.5 (H) 05/24/2020 1326   LYMPHSABS 1.8 10/30/2013 1202   MONOABS 0.8 10/30/2013 1202   EOSABS 0.1 10/30/2013 1202   BASOSABS 0.0 10/30/2013 1202    Plt: Lab Results  Component Value Date   PLT 440 (H) 05/24/2020    COVID-19 Labs  No results for input(s): DDIMER, FERRITIN, LDH, CRP in the last 72 hours.  Lab Results  Component Value Date   SARSCOV2NAA NEGATIVE 05/24/2020       HG/HCT  Down     Component Value Date/Time   HGB 10.4 (L) 05/24/2020 1326   HCT 32.8 (L) 05/24/2020 1326   MCV 86.8 05/24/2020 1326    Recent Labs  Lab 05/24/20 1326  LIPASE 26   No results for input(s): AMMONIA in the last 168 hours.     ECG: Ordered Personally reviewed by me showing: HR : 66 Rhythm:  NSR,    no evidence of ischemic changes QTC 476   BNP (last 3 results) No results for input(s): BNP in the last 8760 hours.    DM  labs:  HbA1C: Recent Labs    05/23/20 1613  HGBA1C 7.6*     CBG (last 3)  Recent Labs    05/24/20 1945  GLUCAP 115*       UA   no evidence of UTI   Urine analysis:    Component Value Date/Time   COLORURINE YELLOW 05/24/2020 1324   APPEARANCEUR HAZY (A) 05/24/2020 1324   LABSPEC 1.012 05/24/2020 1324   PHURINE 6.0 05/24/2020 1324   GLUCOSEU 50 (A) 05/24/2020 1324   HGBUR NEGATIVE 05/24/2020 1324   BILIRUBINUR NEGATIVE 05/24/2020 1324    KETONESUR NEGATIVE 05/24/2020 1324   PROTEINUR >=300 (A) 05/24/2020 1324   UROBILINOGEN 1.0 10/22/2013 1626   NITRITE NEGATIVE 05/24/2020 1324   LEUKOCYTESUR NEGATIVE 05/24/2020 1324      Ordered  CT HEAD  NON acute  CXR -consistent with emphysema  CTabd/pelvis -possible diverticulitis no perforation or abscess Lung changes worrisome for emphysema     ED Triage Vitals  Enc Vitals Group     BP 05/24/20 1322 (!) 208/83     Pulse Rate 05/24/20 1322 85     Resp 05/24/20 1322 18     Temp 05/24/20 1322 99.6 F (37.6 C)     Temp Source 05/24/20 1322 Oral     SpO2 05/24/20 1322 99 %     Weight --      Height 05/24/20 1322 5' 2"  (1.575 m)     Head Circumference --      Peak Flow --      Pain Score 05/24/20 1323 0     Pain Loc --      Pain Edu? --      Excl. in [place]? --  ASNK(53)@       Latest  Blood pressure (!) 188/74, pulse 73, temperature 99.6 F (37.6 C), temperature source Oral, resp. rate 17, height 5' 2"  (1.575 m), SpO2 100 %.   Review of Systems:    Pertinent positives include:   Fatigue, indigestion, abdominal pain, nausea, vomiting, diarrhea,  Constitutional:  No weight loss, night sweats, Fevers, chills, weight loss  HEENT:  No headaches, Difficulty swallowing,Tooth/dental problems,Sore throat,  No sneezing, itching, ear ache, nasal congestion, post nasal drip,  Cardio-vascular:  No chest pain, Orthopnea, PND, anasarca, dizziness, palpitations.no Bilateral lower extremity swelling  GI:  No heartburn,  change in bowel habits, loss of appetite, melena, blood in stool, hematemesis Resp:  no shortness of breath at rest. No dyspnea on exertion, No excess mucus, no productive cough, No non-productive cough, No coughing up of blood.No change in color of mucus.No wheezing. Skin:  no rash or lesions. No jaundice GU:  no dysuria, change in color of urine, no urgency or frequency. No straining to urinate.  No flank pain.  Musculoskeletal:  No joint pain or no joint  swelling. No decreased range of motion. No back pain.  Psych:  No change in mood or affect. No depression or anxiety. No memory loss.  Neuro: no localizing neurological complaints, no tingling, no weakness, no double vision, no gait abnormality, no slurred speech, no confusion  All systems reviewed and apart from [place] all are negative  Past Medical History:   Past Medical History:  Diagnosis Date  . Hypertension   . Stroke [facility])       Past Surgical History:  Procedure Laterality Date  . CESAREAN SECTION      Social History:  Ambulatory  independently      reports that she has been smoking cigarettes. She has a 2.50 pack-year smoking history. She has never used smokeless tobacco. She reports that she does not drink alcohol and does not use drugs.   Family History:   Family History  Problem Relation Age of Onset  . Stroke Mother   . Hypertension Mother   . Hyperlipidemia Mother   . Hypertension Father   . Hyperlipidemia Father     Allergies: Allergies  Allergen Reactions  . Codeine Nausea And Vomiting  . Adhesive [Tape] Other (See Comments)    Tape breaks out the skin if it is left on for a lengthy period of time     Prior to Admission medications   Medication Sig Start Date End Date Taking? Authorizing Provider  amLODipine (NORVASC) 10 MG tablet Take 1 tablet (10 mg total) by mouth daily. 05/23/20  Yes Charlott Rakes, MD  aspirin 325 MG tablet Take 1 tablet (325 mg total) by mouth daily. 11/25/18  Yes McClung, Angela M, PA-C  atorvastatin (LIPITOR) 40 MG tablet TAKE 1 TABLET BY MOUTH DAILY AT 6 PM. Patient taking differently: Take 40 mg by mouth every evening.  05/23/20  Yes Charlott Rakes, MD  cetirizine (ZYRTEC) 10 MG tablet Take 1 tablet (10 mg total) by mouth daily. Patient taking differently: Take 10 mg by mouth daily as needed for allergies or rhinitis.  11/13/16  Yes Newlin, Charlane Ferretti, MD  metFORMIN (GLUCOPHAGE) 500 MG tablet TAKE 2 TABLETS BY MOUTH 2 TIMES  DAILY WITH A MEAL. Patient taking differently: Take 1,000 mg by mouth 2 (two) times daily with a meal.  05/23/20  Yes Newlin, Enobong, MD  Blood Glucose Monitoring Suppl (BLOOD GLUCOSE METER) kit Use as instructed 11/01/13   Reyne Dumas, MD  Blood Glucose Monitoring Suppl (TRUE METRIX METER) DEVI 1 each by Does not apply route 3 (three) times daily before meals. 03/10/18   Charlott Rakes, MD  carvedilol (COREG) 3.125 MG tablet Take 1 tablet (3.125 mg total) by mouth 2 (two) times daily with a meal. 05/23/20   Charlott Rakes, MD  DULoxetine (CYMBALTA) 60 MG capsule Take 1 capsule (60 mg total) by mouth daily. 05/23/20   Charlott Rakes, MD  glipiZIDE (GLUCOTROL) 10 MG tablet Take 1 tablet (10 mg total) by mouth 2 (two) times daily before a meal. 05/23/20   Charlott Rakes, MD  glucose blood (TRUETEST TEST) test strip Use as instructed 03/10/18   Charlott Rakes, MD  Lancets (FREESTYLE) lancets Use as instructed 11/01/13   Reyne Dumas, MD  lisinopril-hydrochlorothiazide (ZESTORETIC) 20-12.5 MG tablet Take 2 tablets by mouth daily. 05/23/20   Charlott Rakes, MD  Olopatadine HCl (PATADAY) 0.2 % SOLN Apply 1 drop to eye daily. Patient not taking: Reported on 05/24/2020 11/13/16   Charlott Rakes, MD  pantoprazole (PROTONIX) 40 MG tablet Take 1 tablet (40 mg total) by mouth daily. Patient not taking: Reported on 05/24/2020 03/09/19   Charlott Rakes, MD  promethazine (PHENERGAN) 25 MG suppository Place 1 suppository (25 mg total) rectally every 8 (eight) hours as needed for nausea or vomiting. 05/23/20   Charlott Rakes, MD  triamcinolone cream (KENALOG) 0.1 % Apply 1 application topically 2 (two) times daily. Patient not taking: Reported on 05/24/2020 02/14/16   Charlott Rakes, MD  TRUEPLUS LANCETS 28G MISC 1 each by Does not apply route 3 (three) times daily before meals. 03/10/18   Charlott Rakes, MD   Physical Exam: Vitals with BMI 05/24/2020 05/24/2020 05/24/2020  Height - - -  Weight - - -  BMI - - -   Systolic [phone]  Diastolic 74 83 61  Pulse 73 71 67     1. General:  in No Acute distress   Chronically ill -appearing 2. Psychological: Alert and  Oriented 3. Head/ENT:    Dry Mucous Membranes                          Head Non traumatic, neck supple                            Poor Dentition 4. SKIN:   decreased Skin turgor,  Skin clean Dry and intact no rash 5. Heart: Regular rate and rhythm no  Murmur, no Rub or gallop 6. Lungs: Clear to auscultation bilaterally, no wheezes or crackles   7. Abdomen: Soft,  non-tender, Non distended   obese  bowel sounds present 8. Lower extremities: no clubbing, cyanosis, trace edema 9. Neurologically Grossly intact, moving all 4 extremities equally  10. MSK: Normal range of motion   All other LABS:     Recent Labs  Lab 05/24/20 1326  WBC 16.5*  HGB 10.4*  HCT 32.8*  MCV 86.8  PLT 440*     Recent Labs  Lab 05/23/20 1656 05/24/20 1326 05/24/20 1400  NA 144 140  --   K 2.6* 2.5*  --   CL 102 100  --   CO2 25 22  --   GLUCOSE 179* 280*  --   BUN 53* 51*  --   CREATININE 4.01* 4.20*  --   CALCIUM 7.0* 6.7*  --   MG  --   --  1.4*  Recent Labs  Lab 05/23/20 1656 05/24/20 1326  AST 4 11*  ALT 4 8  ALKPHOS 118 91  BILITOT <0.2 0.4  PROT 5.8* 6.1*  ALBUMIN 3.0* 2.2*       Cultures: No results found for: SDES, SPECREQUEST, CULT, REPTSTATUS   Radiological Exams on Admission: CT Abdomen Pelvis Wo Contrast  Result Date: 05/24/2020 CLINICAL DATA:  Nausea, vomiting, greater than 40 pound weight loss, acute renal injury. EXAM: CT ABDOMEN AND PELVIS WITHOUT CONTRAST TECHNIQUE: Multidetector CT imaging of the abdomen and pelvis was performed following the standard protocol without IV contrast. COMPARISON:  None. FINDINGS: Lower chest: Lingular linear atelectasis. Paraseptal cystic changes of bilateral lower lobes. At least mild 2 vessel coronary artery calcifications. Hepatobiliary: The liver is enlarged measuring up to  20 cm. No focal liver abnormality is seen. No gallstones, gallbladder wall thickening, or biliary dilatation. Pancreas: Unremarkable. No pancreatic ductal dilatation or surrounding inflammatory changes. Spleen: Normal in size without focal abnormality. Adrenals/Urinary Tract: No adrenal nodule bilaterally. Bilateral kidneys enhance symmetrically. No hydronephrosis. No hydroureter. The urinary bladder is unremarkable. Stomach/Bowel: Stomach is within normal limits. Appendix appears normal. No evidence of small bowel wall thickening, distention, or inflammatory changes. There is mid sigmoid wall thickening with associated perisigmoid fat stranding and suggestion of been inflated diverticula open (7:91, 3:67). Scattered colonic diverticulosis that is more prominent within the sigmoid colon. The rectum is underdistended with suggestion of mid rectal wall thickening. No bowel obstruction. Vascular/Lymphatic: No abdominal aorta or iliac aneurysm. Moderate to severe atherosclerotic plaque of the aorta and its branches. No abdominal, pelvic, or inguinal lymphadenopathy. Reproductive: Uterus and bilateral adnexa are unremarkable. Other: No intraperitoneal free fluid. No intraperitoneal free gas. No organized fluid collection. Musculoskeletal: No abdominal wall hernia or abnormality No suspicious lytic or blastic osseous lesions. No acute displaced fracture. Multilevel degenerative changes of the spine. Degenerative changes of the right hip. IMPRESSION: 1. Uncomplicated sigmoid diverticulosis with acute diverticulitis. No associated bowel perforation or abscess formation. Post antibiotic treatment and after resolution of inflammatory changes, recommend colonoscopy to evaluate for underlying malignancy given patient's symptoms. 2. Other imaging findings of potential clinical significance: Paraseptal cystic changes of bilateral lower lobes which may represent emphysema versus pulmonary fibrosis. Hepatomegaly. Electronically  Signed   By: Iven Finn M.D.   On: 05/24/2020 18:49   DG Chest 2 View  Result Date: 05/24/2020 CLINICAL DATA:  Hypertension, emesis, loss of appetite with nausea EXAM: CHEST - 2 VIEW COMPARISON:  CT 04/29/2009, radiograph 10/22/2013 FINDINGS: Chronically coarsened interstitial changes and subpleural reticular opacities compatible with the extensive paraseptal and centrilobular emphysematous changes seen on comparison CT. These findings are overall quite similar to the comparison radiograph in 2015 with some persistent mild hyperinflation and increased AP diameter of the chest. No focal consolidative opacity, convincing features of edema, pneumothorax or visible effusion. The aorta is calcified. The remaining cardiomediastinal contours are unremarkable. No acute osseous or soft tissue abnormality. Telemetry leads overlie the chest. IMPRESSION: 1. Chronic hyperinflation, coarsened interstitial changes and subpleural reticular opacities compatible with the extensive paraseptal and centrilobular emphysematous changes seen on comparison studies and not significantly changed since 2015. 2. No acute cardiopulmonary disease. 3.  Aortic Atherosclerosis (ICD10-I70.0). Electronically Signed   By: Lovena Le M.D.   On: 05/24/2020 18:05    Chart has been reviewed    Assessment/Plan  64 y.o. female with medical history significant of HTn, hx of CVA with right side weakness, psoriasis, DM type II, GERD, Tobacco abuse  Admitted for  chronic nausea vomiting diarrhea fever acute diverticulitis dehydration and AKI as well as electrolyte abnormalities  Present on Admission: . Diverticulitis - continue antibiotics Rocephin, falgyl, bowel rest, may need further GI work-up notify GI the patient has been admitted  . Essential hypertension, benign -hold lisinopril given AKI continue Norvasc if leg edema persists may need to hold off on more Norvasc as well  . GERD (gastroesophageal reflux disease) -hold off on  Protonix   . Hyperlipidemia stable continue home medications  . Anemia obtain anemia panel evaluate for any evidence of iron deficiency anemia obtain Hemoccult stool will likely need further GI work-up to rule out malignancy  . Nausea vomiting and diarrhea chronic has been going on for past 6 months but progressive with weight loss and mucus patient would likely benefit from GI evaluation. Obtain gastric panel . AKI (acute kidney injury) ([place]) most likely secondary to dehydration obtain urine electrolytes  . Dehydration rehydrate and follow fluid status  . Hypomagnesemia replace and recheck in a.m.  Marland Kitchen Hypokalemia - - will replace and repeat in AM,  check magnesium level and replace as needed  . Tobacco abuse -  - Spoke about importance of quitting spent 5 minutes discussing options for treatment, prior attempts at quitting, and dangers of smoking  -At this point patient is    interested in quitting but have had failure in the past Feels uncertain that she can  - order nicotine patch   - nursing tobacco cessation protocol  . Emphysema of lung ([place])  -spoke about importance of quitting smoking at this point no wheezing or shortness of breath  Asymmetrical leg edema will obtain Dopplers  Other plan as per orders.  DVT prophylaxis:  SCD     Code Status:    Code Status: Full Code FULL CODE  as per patient  And family  I had personally discussed CODE STATUS with patient and family     Family Communication:   Family   at  Bedside  plan of care was discussed on the phone with   Daughter,   Disposition Plan:   To home once workup is complete and patient is stable   Following barriers for discharge:                            Electrolytes corrected                               Anemia work up                              Will need to be able to tolerate PO                                          Will need consultants to evaluate patient prior to discharge                        Would benefit from PT/OT eval prior to DC  Ordered                                     Nutrition    consulted  Consults called: [facility] notified through epic send a message to Dr. Loletha Carrow  Admission status:  ED Disposition    ED Disposition Condition [place]: [place] [100100]  Level of Care: Telemetry Medical [104]  May admit patient to [facility] or [facility] if equivalent level of care is available:: No  Covid Evaluation: Confirmed COVID Negative  Diagnosis: Diverticulitis [938182]  Admitting Physician: Toy Baker [[id number]]  Attending Physician: Toy Baker [[id number]]  Estimated length of stay: past midnight tomorrow  Certification:: I certify this patient will need inpatient services for at least 2 midnights        inpatient     I Expect 2 midnight stay secondary to severity of patient's current illness need for inpatient interventions justified by the following:   Severe lab/radiological/exam abnormalities including:    diverticultiis and extensive comorbidities including:  DM2    COPD/asthma   CKD   That are currently affecting medical management.   I expect  patient to be hospitalized for 2 midnights requiring inpatient medical care.  Patient is at high risk for adverse outcome (such as loss of life or disability) if not treated.  Indication for inpatient stay as follows:      Need for operative/procedural  intervention    Need for IV antibiotics, IV fluids,     Level of care    tele  For 12H      Lab Results  Component Value Date   [place] NEGATIVE 05/24/2020     Precautions: admitted as  Covid Negative    PPE: Used by the provider:   P100  eye Goggles,  Gloves    Jorja Empie 05/24/2020, 10:00 PM    [facility]     after 2 AM please page floor coverage PA If 7AM-7PM, please contact the day team taking care of the patient using [email or web address]   Patient was  evaluated in the context of the global COVID-19 pandemic, which necessitated consideration that the patient might be at risk for infection with the SARS-CoV-2 virus that causes COVID-19. Institutional protocols and algorithms that pertain to the evaluation of patients at risk for COVID-19 are in a state of rapid change based on information released by regulatory bodies including the CDC and federal and state organizations. These policies and algorithms were followed during the patient's care.

## 2020-05-25 ENCOUNTER — Encounter (HOSPITAL_COMMUNITY): Payer: Self-pay | Admitting: Internal Medicine

## 2020-05-25 ENCOUNTER — Inpatient Hospital Stay (HOSPITAL_COMMUNITY): Payer: Medicaid Other

## 2020-05-25 DIAGNOSIS — A048 Other specified bacterial intestinal infections: Secondary | ICD-10-CM | POA: Insufficient documentation

## 2020-05-25 DIAGNOSIS — R609 Edema, unspecified: Secondary | ICD-10-CM

## 2020-05-25 DIAGNOSIS — R933 Abnormal findings on diagnostic imaging of other parts of digestive tract: Secondary | ICD-10-CM

## 2020-05-25 LAB — BASIC METABOLIC PANEL
Anion gap: 12 (ref 5–15)
Anion gap: 14 (ref 5–15)
BUN: 44 mg/dL — ABNORMAL HIGH (ref 8–23)
BUN: 41 mg/dL — ABNORMAL HIGH (ref 8–23)
CO2: 24 mmol/L (ref 22–32)
CO2: 22 mmol/L (ref 22–32)
Calcium: 6.2 mg/dL — CL (ref 8.9–10.3)
Calcium: 6.6 mg/dL — ABNORMAL LOW (ref 8.9–10.3)
Chloride: 107 mmol/L (ref 98–111)
Chloride: 106 mmol/L (ref 98–111)
Creatinine, Ser: 3.56 mg/dL — ABNORMAL HIGH (ref 0.44–1.00)
Creatinine, Ser: 3.39 mg/dL — ABNORMAL HIGH (ref 0.44–1.00)
GFR calc Af Amer: 15 mL/min — ABNORMAL LOW (ref >60)
GFR calc Af Amer: 16 mL/min — ABNORMAL LOW (ref >60)
GFR calc non Af Amer: 13 mL/min — ABNORMAL LOW (ref >60)
GFR calc non Af Amer: 14 mL/min — ABNORMAL LOW (ref >60)
Glucose, Bld: 176 mg/dL — ABNORMAL HIGH (ref 70–99)
Glucose, Bld: 132 mg/dL — ABNORMAL HIGH (ref 70–99)
Potassium: 2.3 mmol/L — CL (ref 3.5–5.1)
Potassium: 2.5 mmol/L — CL (ref 3.5–5.1)
Sodium: 143 mmol/L (ref 135–145)
Sodium: 142 mmol/L (ref 135–145)

## 2020-05-25 LAB — I-STAT VENOUS BLOOD GAS, ED
Acid-Base Excess: 2 mmol/L (ref 0.0–2.0)
Bicarbonate: 26.1 mmol/L (ref 20.0–28.0)
Calcium, Ion: 0.75 mmol/L — CL (ref 1.15–1.40)
HCT: 32 % — ABNORMAL LOW (ref 36.0–46.0)
Hemoglobin: 10.9 g/dL — ABNORMAL LOW (ref 12.0–15.0)
O2 Saturation: 99 %
Potassium: 2.3 mmol/L — CL (ref 3.5–5.1)
Sodium: 143 mmol/L (ref 135–145)
TCO2: 27 mmol/L (ref 22–32)
pCO2, Ven: 36.1 mmHg — ABNORMAL LOW (ref 44.0–60.0)
pH, Ven: 7.467 — ABNORMAL HIGH (ref 7.250–7.430)
pO2, Ven: 137 mmHg — ABNORMAL HIGH (ref 32.0–45.0)

## 2020-05-25 LAB — HEMOGLOBIN A1C
Hgb A1c MFr Bld: 7.8 % — ABNORMAL HIGH (ref 4.8–5.6)
Hgb A1c MFr Bld: 7.9 % — ABNORMAL HIGH (ref 4.8–5.6)
Mean Plasma Glucose: 177.16 mg/dL
Mean Plasma Glucose: 180.03 mg/dL

## 2020-05-25 LAB — PHOSPHORUS
Phosphorus: 5.6 mg/dL — ABNORMAL HIGH (ref 2.5–4.6)
Phosphorus: 5.2 mg/dL — ABNORMAL HIGH (ref 2.5–4.6)

## 2020-05-25 LAB — CBC WITH DIFFERENTIAL/PLATELET
Abs Immature Granulocytes: 0.26 10*3/uL — ABNORMAL HIGH (ref 0.00–0.07)
Basophils Absolute: 0.1 10*3/uL (ref 0.0–0.1)
Basophils Relative: 0 %
Eosinophils Absolute: 0.2 10*3/uL (ref 0.0–0.5)
Eosinophils Relative: 1 %
HCT: 31.5 % — ABNORMAL LOW (ref 36.0–46.0)
Hemoglobin: 9.8 g/dL — ABNORMAL LOW (ref 12.0–15.0)
Immature Granulocytes: 2 %
Lymphocytes Relative: 10 %
Lymphs Abs: 1.3 10*3/uL (ref 0.7–4.0)
MCH: 28.1 pg (ref 26.0–34.0)
MCHC: 31.1 g/dL (ref 30.0–36.0)
MCV: 90.3 fL (ref 80.0–100.0)
Monocytes Absolute: 0.9 10*3/uL (ref 0.1–1.0)
Monocytes Relative: 7 %
Neutro Abs: 10.8 10*3/uL — ABNORMAL HIGH (ref 1.7–7.7)
Neutrophils Relative %: 80 %
Platelets: 377 10*3/uL (ref 150–400)
RBC: 3.49 MIL/uL — ABNORMAL LOW (ref 3.87–5.11)
RDW: 14.2 % (ref 11.5–15.5)
WBC: 13.5 10*3/uL — ABNORMAL HIGH (ref 4.0–10.5)
nRBC: 0 % (ref 0.0–0.2)

## 2020-05-25 LAB — COMPREHENSIVE METABOLIC PANEL
ALT: 9 U/L (ref 0–44)
AST: 10 U/L — ABNORMAL LOW (ref 15–41)
Albumin: 1.9 g/dL — ABNORMAL LOW (ref 3.5–5.0)
Alkaline Phosphatase: 75 U/L (ref 38–126)
Anion gap: 14 (ref 5–15)
BUN: 44 mg/dL — ABNORMAL HIGH (ref 8–23)
CO2: 21 mmol/L — ABNORMAL LOW (ref 22–32)
Calcium: 6.4 mg/dL — CL (ref 8.9–10.3)
Chloride: 106 mmol/L (ref 98–111)
Creatinine, Ser: 3.47 mg/dL — ABNORMAL HIGH (ref 0.44–1.00)
GFR calc Af Amer: 15 mL/min — ABNORMAL LOW (ref >60)
GFR calc non Af Amer: 13 mL/min — ABNORMAL LOW (ref >60)
Glucose, Bld: 205 mg/dL — ABNORMAL HIGH (ref 70–99)
Potassium: 2.4 mmol/L — CL (ref 3.5–5.1)
Sodium: 141 mmol/L (ref 135–145)
Total Bilirubin: 0.4 mg/dL (ref 0.3–1.2)
Total Protein: 5.4 g/dL — ABNORMAL LOW (ref 6.5–8.1)

## 2020-05-25 LAB — I-STAT CHEM 8, ED
BUN: 38 mg/dL — ABNORMAL HIGH (ref 8–23)
Calcium, Ion: 0.88 mmol/L — CL (ref 1.15–1.40)
Chloride: 106 mmol/L (ref 98–111)
Creatinine, Ser: 3.6 mg/dL — ABNORMAL HIGH (ref 0.44–1.00)
Glucose, Bld: 128 mg/dL — ABNORMAL HIGH (ref 70–99)
HCT: 28 % — ABNORMAL LOW (ref 36.0–46.0)
Hemoglobin: 9.5 g/dL — ABNORMAL LOW (ref 12.0–15.0)
Potassium: 2.5 mmol/L — CL (ref 3.5–5.1)
Sodium: 143 mmol/L (ref 135–145)
TCO2: 23 mmol/L (ref 22–32)

## 2020-05-25 LAB — BRAIN NATRIURETIC PEPTIDE: B Natriuretic Peptide: 209.4 pg/mL — ABNORMAL HIGH (ref 0.0–100.0)

## 2020-05-25 LAB — GASTROINTESTINAL PANEL BY PCR, STOOL (REPLACES STOOL CULTURE)

## 2020-05-25 LAB — H. PYLORI BREATH TEST: H pylori Breath Test: POSITIVE — AB

## 2020-05-25 LAB — FOLATE: Folate: 9.1 ng/mL (ref >5.9)

## 2020-05-25 LAB — FERRITIN: Ferritin: 147 ng/mL (ref 11–307)

## 2020-05-25 LAB — IRON AND TIBC
Iron: 22 ug/dL — ABNORMAL LOW (ref 28–170)
Saturation Ratios: 13 % (ref 10.4–31.8)
TIBC: 165 ug/dL — ABNORMAL LOW (ref 250–450)
UIBC: 143 ug/dL

## 2020-05-25 LAB — CK: Total CK: 69 U/L (ref 38–234)

## 2020-05-25 LAB — C DIFFICILE QUICK SCREEN W PCR REFLEX
C Diff antigen: NEGATIVE
C Diff interpretation: NOT DETECTED
C Diff toxin: NEGATIVE

## 2020-05-25 LAB — CBG MONITORING, ED
Glucose-Capillary: 121 mg/dL — ABNORMAL HIGH (ref 70–99)
Glucose-Capillary: 123 mg/dL — ABNORMAL HIGH (ref 70–99)
Glucose-Capillary: 143 mg/dL — ABNORMAL HIGH (ref 70–99)
Glucose-Capillary: 181 mg/dL — ABNORMAL HIGH (ref 70–99)
Glucose-Capillary: 184 mg/dL — ABNORMAL HIGH (ref 70–99)
Glucose-Capillary: 189 mg/dL — ABNORMAL HIGH (ref 70–99)

## 2020-05-25 LAB — VITAMIN B12: Vitamin B-12: 218 pg/mL (ref 180–914)

## 2020-05-25 LAB — MAGNESIUM: Magnesium: 1.9 mg/dL (ref 1.7–2.4)

## 2020-05-25 LAB — RETICULOCYTES
Immature Retic Fract: 21.5 % — ABNORMAL HIGH (ref 2.3–15.9)
RBC.: 3.36 MIL/uL — ABNORMAL LOW (ref 3.87–5.11)
Retic Count, Absolute: 56.8 10*3/uL (ref 19.0–186.0)
Retic Ct Pct: 1.7 % (ref 0.4–3.1)

## 2020-05-25 LAB — LACTIC ACID, PLASMA: Lactic Acid, Venous: 0.6 mmol/L (ref 0.5–1.9)

## 2020-05-25 LAB — TSH: TSH: 3.049 u[IU]/mL (ref 0.350–4.500)

## 2020-05-25 LAB — GLUCOSE, CAPILLARY: Glucose-Capillary: 162 mg/dL — ABNORMAL HIGH (ref 70–99)

## 2020-05-25 MED ORDER — POTASSIUM CHLORIDE 10 MEQ/100ML IV SOLN
10.0000 meq | INTRAVENOUS | Status: AC
  Administered 2020-05-25 (×4): 10 meq via INTRAVENOUS
  Filled 2020-05-25 (×4): qty 100

## 2020-05-25 MED ORDER — PANTOPRAZOLE SODIUM 40 MG IV SOLR
40.0000 mg | Freq: Two times a day (BID) | INTRAVENOUS | Status: DC
  Administered 2020-05-25 – 2020-05-29 (×9): 40 mg via INTRAVENOUS
  Filled 2020-05-25 (×9): qty 40

## 2020-05-25 MED ORDER — METOCLOPRAMIDE HCL 5 MG/ML IJ SOLN
10.0000 mg | Freq: Four times a day (QID) | INTRAMUSCULAR | Status: AC
  Administered 2020-05-25 – 2020-05-26 (×2): 10 mg via INTRAVENOUS
  Filled 2020-05-25 (×2): qty 2

## 2020-05-25 MED ORDER — CALCIUM GLUCONATE-NACL 1-0.675 GM/50ML-% IV SOLN
1.0000 g | Freq: Once | INTRAVENOUS | Status: AC
  Administered 2020-05-25: 1000 mg via INTRAVENOUS
  Filled 2020-05-25: qty 50

## 2020-05-25 MED ORDER — LOPERAMIDE HCL 2 MG PO CAPS
2.0000 mg | ORAL_CAPSULE | Freq: Two times a day (BID) | ORAL | Status: DC | PRN
  Administered 2020-05-27: 2 mg via ORAL
  Filled 2020-05-25 (×3): qty 1

## 2020-05-25 MED ORDER — METOCLOPRAMIDE HCL 5 MG/ML IJ SOLN
5.0000 mg | Freq: Four times a day (QID) | INTRAMUSCULAR | Status: DC | PRN
  Administered 2020-05-25: 5 mg via INTRAVENOUS
  Filled 2020-05-25: qty 2

## 2020-05-25 MED ORDER — PROMETHAZINE HCL 25 MG/ML IJ SOLN
25.0000 mg | Freq: Four times a day (QID) | INTRAMUSCULAR | Status: DC | PRN
  Administered 2020-05-25 (×2): 25 mg via INTRAVENOUS
  Filled 2020-05-25 (×2): qty 1

## 2020-05-25 MED ORDER — POTASSIUM CHLORIDE IN NACL 40-0.9 MEQ/L-% IV SOLN
INTRAVENOUS | Status: DC
  Filled 2020-05-25 (×4): qty 1000

## 2020-05-25 NOTE — Progress Notes (Signed)
Lower extremity venous has been completed.   Preliminary results in CV Proc.   Abram Sander 05/25/2020 2:25 PM

## 2020-05-25 NOTE — ED Notes (Signed)
Checked patient cbg  was 123 notified RN of blood sugar patient is resting with family at bedside

## 2020-05-25 NOTE — Consult Note (Addendum)
[facility] Consult: 8:35 AM 05/25/2020  LOS: 1 day    Referring Provider: Dr Fanny Bien  Primary Care Physician:  Charlott Rakes, MD Primary Gastroenterologist:  unassigned  Dtr Chapman Moss [phone].     Reason for Consultation:    Chronic N/V, diarrhea   HPI: Tina Gomez is a 64 y.o. female.  PMH CVA treated w TPA in 10/2013, on ASA 325 no anti-platelet meds.  T2DM on oral agents.  Htn.  HLD.  Benign inflamatory, hyperplastic changes on Laryngeal bxs with removal of growth in 2011.    Referred to ED by PMD.  Concern for AKI in setting of progressive nausea, non-bloody/bilious emesis over 3 months.  Initially ovcurring in AM, now multiple episodes daily and nocturnal hit or miss as to tolerating po.  Carried a diagnosis of GERD though she has never had any radiologic or endoscopic studies to confirm this.  She been taking omeprazole and then pantoprazole.  When the symptoms became persistent, she started reading on the Internet and read about PPI not being something you should be on long-term so she stopped her Protonix about 4 weeks ago.  This made no difference as to the improvement or progression of the nausea and vomiting.  Denies dysphagia.  About a month ago she developed frequent, small volume squirts of watery, nonbloody stool.  She occasionally has some mid to upper abdominal discomfort but this is after she has been retching.  No significant abdominal pain and no pain in the lower abdomen.  In all she has lost close to 40#.   Labs confirm AKI, hypokalemia, hypocalemia, hypomagnesemia, hyperglycemia to 280, anion gap. Low albumin, O/w normal LFTs and normal Lipase.   Hgb 9.8, normal MCV.  WBC 16.5. And H. pylori breath test has been obtained and is positive.  CTAP w/o contrast:  Sigmoid  diverticulitis w/o abscess or perf.  rec repeat CT after abx to assure no malignancy.  Cystic changes in LL of lung bil, empysema vs fibrosis.  Hepatomegaly.    No antibiotic exposure in the last 6 months.  No exposure to toddlers or kids in diapers. Patient does not use NSAIDs other than the full dose aspirin. No current or past history of alcohol use.  Smokes 1 pack/day.  No family history of ulcer disease, colon cancer, colon polyps.  Her father had lung cancer which metastasized to his liver.  Past Medical History:  Diagnosis Date  . Hypertension   . Stroke [facility])     Past Surgical History:  Procedure Laterality Date  . CESAREAN SECTION      Prior to Admission medications   Medication Sig Start Date End Date Taking? Authorizing Provider  amLODipine (NORVASC) 10 MG tablet Take 1 tablet (10 mg total) by mouth daily. 05/23/20  Yes Charlott Rakes, MD  aspirin 325 MG tablet Take 1 tablet (325 mg total) by mouth daily. 11/25/18  Yes McClung, Angela M, PA-C  atorvastatin (LIPITOR) 40 MG tablet TAKE 1 TABLET BY MOUTH DAILY AT 6 PM. Patient taking differently: Take 40 mg  by mouth every evening.  05/23/20  Yes Charlott Rakes, MD  cetirizine (ZYRTEC) 10 MG tablet Take 1 tablet (10 mg total) by mouth daily. Patient taking differently: Take 10 mg by mouth daily as needed for allergies or rhinitis.  11/13/16  Yes Newlin, Charlane Ferretti, MD  metFORMIN (GLUCOPHAGE) 500 MG tablet TAKE 2 TABLETS BY MOUTH 2 TIMES DAILY WITH A MEAL. Patient taking differently: Take 1,000 mg by mouth 2 (two) times daily with a meal.  05/23/20  Yes Newlin, Enobong, MD  Blood Glucose Monitoring Suppl (BLOOD GLUCOSE METER) kit Use as instructed 11/01/13   Reyne Dumas, MD  Blood Glucose Monitoring Suppl (TRUE METRIX METER) DEVI 1 each by Does not apply route 3 (three) times daily before meals. 03/10/18   Charlott Rakes, MD  carvedilol (COREG) 3.125 MG tablet Take 1 tablet (3.125 mg total) by mouth 2 (two) times daily with a meal.  05/23/20   Charlott Rakes, MD  DULoxetine (CYMBALTA) 60 MG capsule Take 1 capsule (60 mg total) by mouth daily. 05/23/20   Charlott Rakes, MD  glipiZIDE (GLUCOTROL) 10 MG tablet Take 1 tablet (10 mg total) by mouth 2 (two) times daily before a meal. 05/23/20   Charlott Rakes, MD  glucose blood (TRUETEST TEST) test strip Use as instructed 03/10/18   Charlott Rakes, MD  Lancets (FREESTYLE) lancets Use as instructed 11/01/13   Reyne Dumas, MD  lisinopril-hydrochlorothiazide (ZESTORETIC) 20-12.5 MG tablet Take 2 tablets by mouth daily. 05/23/20   Charlott Rakes, MD  Olopatadine HCl (PATADAY) 0.2 % SOLN Apply 1 drop to eye daily. Patient not taking: Reported on 05/24/2020 11/13/16   Charlott Rakes, MD  pantoprazole (PROTONIX) 40 MG tablet Take 1 tablet (40 mg total) by mouth daily. Patient not taking: Reported on 05/24/2020 03/09/19   Charlott Rakes, MD  promethazine (PHENERGAN) 25 MG suppository Place 1 suppository (25 mg total) rectally every 8 (eight) hours as needed for nausea or vomiting. 05/23/20   Charlott Rakes, MD  triamcinolone cream (KENALOG) 0.1 % Apply 1 application topically 2 (two) times daily. Patient not taking: Reported on 05/24/2020 02/14/16   Charlott Rakes, MD  TRUEPLUS LANCETS 28G MISC 1 each by Does not apply route 3 (three) times daily before meals. 03/10/18   Charlott Rakes, MD    Scheduled Meds: . amLODipine  10 mg Oral Daily  . atorvastatin  40 mg Oral QPM  . carvedilol  3.125 mg Oral BID WC  . DULoxetine  60 mg Oral Daily  . insulin aspart  0-9 Units Subcutaneous Q4H  . sodium chloride flush  3 mL Intravenous Q12H   Infusions: . 0.9 % NaCl with KCl 40 mEq / L    . cefTRIAXone (ROCEPHIN)  IV    . metronidazole Stopped (05/25/20 0507)   PRN Meds: acetaminophen **OR** acetaminophen, HYDROmorphone (DILAUDID) injection, ondansetron **OR** ondansetron (ZOFRAN) IV, promethazine   Allergies as of 05/24/2020 - Review Complete 05/24/2020  Allergen Reaction Noted  . Codeine  Nausea And Vomiting 10/22/2013  . Adhesive [tape] Other (See Comments) 05/24/2020    Family History  Problem Relation Age of Onset  . Stroke Mother   . Hypertension Mother   . Hyperlipidemia Mother   . Hypertension Father   . Hyperlipidemia Father     Social History   Socioeconomic History  . Marital status: Divorced    Spouse name: Not on file  . Number of children: Not on file  . Years of education: Not on file  . Highest education level: Not on file  Occupational History  . Not on file  Tobacco Use  . Smoking status: Current Every Day Smoker    Packs/day: 0.25    Years: 10.00    Pack years: 2.50    Types: Cigarettes  . Smokeless tobacco: Never Used  . Tobacco comment: 5 cigs daily  Substance and Sexual Activity  . Alcohol use: No  . Drug use: No  . Sexual activity: Not on file  Other Topics Concern  . Not on file  Social History Narrative  . Not on file   Social Determinants of Health   Financial Resource Strain:   . Difficulty of Paying Living Expenses: Not on file  Food Insecurity:   . Worried About Charity fundraiser in the Last Year: Not on file  . Ran Out of Food in the Last Year: Not on file  Transportation Needs:   . Lack of Transportation (Medical): Not on file  . Lack of Transportation (Non-Medical): Not on file  Physical Activity:   . Days of Exercise per Week: Not on file  . Minutes of Exercise per Session: Not on file  Stress:   . Feeling of Stress : Not on file  Social Connections:   . Frequency of Communication with Friends and Family: Not on file  . Frequency of Social Gatherings with Friends and Family: Not on file  . Attends Religious Services: Not on file  . Active Member of Clubs or Organizations: Not on file  . Attends Archivist Meetings: Not on file  . Marital Status: Not on file  Intimate Partner Violence:   . Fear of Current or Ex-Partner: Not on file  . Emotionally Abused: Not on file  . Physically Abused: Not on  file  . Sexually Abused: Not on file    REVIEW OF SYSTEMS: Constitutional: Feels tired and worn out. ENT:  No nose bleeds Pulm: No shortness of breath.  Occasional nonpurulent cough. CV:  No palpitations, no chest pressure or angina.  In the last few weeks has developed right greater than left lower leg swelling. GU:  No hematuria, no frequency GI: See HPI. Heme: Develops purpura easily with minor trauma on her arms but no excessive bleeding or significant bruising. Transfusions: None. Neuro: Slight residual right lower extremity weakness after her stroke no headaches, no peripheral tingling or numbness.  No syncope, no seizures. ID: No fevers. Derm:  No itching, no rash or sores.  Endocrine: At home she checks her blood sugars and they run anywhere from 1 15-1 90s.  No sweats or chills.  No polyuria or dysuria Immunization: Has been vaccinated for COVID-19. Travel:  None beyond local counties in last few months.    PHYSICAL EXAM: Vital signs in last 24 hours: Vitals:   05/25/20 0500 05/25/20 0600  BP: (!) 153/63 136/64  Pulse: 72 72  Resp:    Temp:    SpO2: 94% 95%   Wt Readings from Last 3 Encounters:  05/23/20 87.1 kg  11/25/18 97.5 kg  03/10/18 100.2 kg    General: Patient looks chronically unwell.  Looks slightly older than stated age. Head: No facial asymmetry or swelling. Eyes: No scleral icterus.  No conjunctival pallor.  EOMI Ears: Not hard of hearing Nose: No discharge.  Sounds a bit congested. Mouth: Upper denture in place.  Mucosa is pink, moist, clear.  Tongue midline. Neck: No JVD, no masses, no thyromegaly. Lungs: Clear bilaterally with reduced breath sounds on the right side.  No rhonchi, no crackles.  No labored breathing.  Vocal quality is hoarse/raspy Heart: RRR.  No MRG.  S1, S2 present. Abdomen: Nontender, nondistended.  Active bowel sounds.  No HSM, masses, bruits, hernias.   Rectal: Deferred Musc/Skeltl: No joint redness, swelling or gross  deformity. Extremities: Slight, nonpitting swelling in the feet bilaterally. Neurologic: Alert.  Oriented x3.  Good historian.  No tremors.  Moves all 4 limbs, strength not tested Skin: A few purpura on her forearms.  No telangiectasia on the trunk. Tattoos: None observed Nodes: No cervical adenopathy Psych: Affect slightly blunted but pleasant, cooperative and calm.  Speech fluid.  Intake/Output from previous day: No intake/output data recorded. Intake/Output this shift: No intake/output data recorded.  LAB RESULTS: Recent Labs    05/24/20 1326 05/25/20 0245  WBC 16.5* 13.5*  HGB 10.4* 9.8*  HCT 32.8* 31.5*  PLT 440* 377   BMET Lab Results  Component Value Date   NA 141 05/25/2020   NA 143 05/25/2020   NA 140 05/24/2020   K 2.4 (LL) 05/25/2020   K 2.3 (LL) 05/25/2020   K 2.5 (LL) 05/24/2020   CL 106 05/25/2020   CL 107 05/25/2020   CL 100 05/24/2020   CO2 21 (L) 05/25/2020   CO2 24 05/25/2020   CO2 22 05/24/2020   GLUCOSE 205 (H) 05/25/2020   GLUCOSE 176 (H) 05/25/2020   GLUCOSE 280 (H) 05/24/2020   BUN 44 (H) 05/25/2020   BUN 44 (H) 05/25/2020   BUN 51 (H) 05/24/2020   CREATININE 3.47 (H) 05/25/2020   CREATININE 3.56 (H) 05/25/2020   CREATININE 4.20 (H) 05/24/2020   CALCIUM 6.4 (LL) 05/25/2020   CALCIUM 6.2 (LL) 05/25/2020   CALCIUM 6.7 (L) 05/24/2020   LFT Recent Labs    05/23/20 1656 05/24/20 1326 05/25/20 0245  PROT 5.8* 6.1* 5.4*  ALBUMIN 3.0* 2.2* 1.9*  AST 4 11* 10*  ALT _0 ALKPHOS 118 91 75  BILITOT <0.2 0.4 0.4   PT/INR Lab Results  Component Value Date   INR 0.92 10/22/2013   INR 0.9 04/29/2009   Hepatitis Panel No results for input(s): HEPBSAG, HCVAB, HEPAIGM, HEPBIGM in the last 72 hours. C-Diff No components found for: CDIFF Lipase     Component Value Date/Time   LIPASE 26 05/24/2020 1326    RADIOLOGY STUDIES: CT Abdomen Pelvis Wo Contrast  Result Date: 05/24/2020 CLINICAL DATA:  Nausea, vomiting, greater than 40  pound weight loss, acute renal injury. EXAM: CT ABDOMEN AND PELVIS WITHOUT CONTRAST TECHNIQUE: Multidetector CT imaging of the abdomen and pelvis was performed following the standard protocol without IV contrast. COMPARISON:  None. FINDINGS: Lower chest: Lingular linear atelectasis. Paraseptal cystic changes of bilateral lower lobes. At least mild 2 vessel coronary artery calcifications. Hepatobiliary: The liver is enlarged measuring up to 20 cm. No focal liver abnormality is seen. No gallstones, gallbladder wall thickening, or biliary dilatation. Pancreas: Unremarkable. No pancreatic ductal dilatation or surrounding inflammatory changes. Spleen: Normal in size without focal abnormality. Adrenals/Urinary Tract: No adrenal nodule bilaterally. Bilateral kidneys enhance symmetrically. No hydronephrosis. No hydroureter. The urinary bladder is unremarkable. Stomach/Bowel: Stomach is within normal limits. Appendix appears normal. No evidence of small bowel wall thickening, distention, or inflammatory changes. There is mid sigmoid wall thickening with associated perisigmoid fat stranding and suggestion of been inflated diverticula open (7:91, 3:67). Scattered colonic diverticulosis that is more prominent within the sigmoid colon. The rectum is underdistended with suggestion of mid rectal wall thickening. No bowel obstruction. Vascular/Lymphatic: No abdominal aorta or iliac  aneurysm. Moderate to severe atherosclerotic plaque of the aorta and its branches. No abdominal, pelvic, or inguinal lymphadenopathy. Reproductive: Uterus and bilateral adnexa are unremarkable. Other: No intraperitoneal free fluid. No intraperitoneal free gas. No organized fluid collection. Musculoskeletal: No abdominal wall hernia or abnormality No suspicious lytic or blastic osseous lesions. No acute displaced fracture. Multilevel degenerative changes of the spine. Degenerative changes of the right hip. IMPRESSION: 1. Uncomplicated sigmoid  diverticulosis with acute diverticulitis. No associated bowel perforation or abscess formation. Post antibiotic treatment and after resolution of inflammatory changes, recommend colonoscopy to evaluate for underlying malignancy given patient's symptoms. 2. Other imaging findings of potential clinical significance: Paraseptal cystic changes of bilateral lower lobes which may represent emphysema versus pulmonary fibrosis. Hepatomegaly. Electronically Signed   By: Iven Finn M.D.   On: 05/24/2020 18:49   DG Chest 2 View  Result Date: 05/24/2020 CLINICAL DATA:  Hypertension, emesis, loss of appetite with nausea EXAM: CHEST - 2 VIEW COMPARISON:  CT 04/29/2009, radiograph 10/22/2013 FINDINGS: Chronically coarsened interstitial changes and subpleural reticular opacities compatible with the extensive paraseptal and centrilobular emphysematous changes seen on comparison CT. These findings are overall quite similar to the comparison radiograph in 2015 with some persistent mild hyperinflation and increased AP diameter of the chest. No focal consolidative opacity, convincing features of edema, pneumothorax or visible effusion. The aorta is calcified. The remaining cardiomediastinal contours are unremarkable. No acute osseous or soft tissue abnormality. Telemetry leads overlie the chest. IMPRESSION: 1. Chronic hyperinflation, coarsened interstitial changes and subpleural reticular opacities compatible with the extensive paraseptal and centrilobular emphysematous changes seen on comparison studies and not significantly changed since 2015. 2. No acute cardiopulmonary disease. 3.  Aortic Atherosclerosis (ICD10-I70.0). Electronically Signed   By: Lovena Le M.D.   On: 05/24/2020 18:05   CT Head Wo Contrast  Result Date: 05/24/2020 CLINICAL DATA:  Nausea and vomiting EXAM: CT HEAD WITHOUT CONTRAST TECHNIQUE: Contiguous axial images were obtained from the base of the skull through the vertex without intravenous  contrast. COMPARISON:  MRI 10/23/2013 FINDINGS: Brain: No acute territorial infarction, hemorrhage or intracranial mass. The ventricles are nonenlarged. Minimal white matter hypodensity likely chronic small vessel ischemic change. Stable ventricle size. Vascular: No hyperdense vessels.  Carotid vascular calcification. Skull: Normal. Negative for fracture or focal lesion. Sinuses/Orbits: No acute finding. Other: None IMPRESSION: 1. No CT evidence for acute intracranial abnormality. 2. Minimal small vessel ischemic changes of the white matter. Electronically Signed   By: Donavan Foil M.D.   On: 05/24/2020 20:45     IMPRESSION:    *  3 months of bilious emesis, progressive inablity to tolerate po later loose stools.  CT showing Sigmoid diverticulitis.  Serum H. pylori breath test is positive.  Although her white counts are elevated, she has no significant abdominal pain, no fever so not clear if she actually has diverticulitis.  R/o PUD versus uncontrolled GERD versus underlying malignancy.   Day 2 Rocephin, Flagyl  *  Normocytic anemia.  Low iron 22, low TIBC, ferritin 147, low need normal iron sat.  Folate and B12 ok.    *   AKI, improved w IVF  *   T2 DM.  avg blood glucose 180 per labs.     *   Hypokalemia.   Hypomagnesemia, corrected.   Hypocalcemia.   *   Wt loss 40#, PCM of undefined severity per labs w albumin 1.9  *   CVA 2015.  On full dose ASA  PLAN:     *  EGD planned for later this morning. She does need colonoscopy but with the question of diverticulitis, and her significant nausea vomiting.  It may not be safe to pursue colonoscopy right now and I doubt she would tolerate the bowel prep anyway.  *   Not currently receiving PPI and will hold off on ordering until she has had her EGD to determine the dosing of this med.  Azucena Freed  05/25/2020, 8:35 AM Phone [phone]  Addendum 1115 AM Due to low potassium, the EGD is deferred til tmrw AM Starting on Protonix 40  mg IV bid and reglan 10 mg IV q 6 hours x 3 doses.  Fults for clears, npo after MN.

## 2020-05-25 NOTE — ED Notes (Signed)
Gave patient some ice water patient is resting with call bell in reach and family at bedside

## 2020-05-25 NOTE — Progress Notes (Signed)
PROGRESS NOTE    Tina Gomez  NFA:[id number] DOB: 1956-03-27 DOA: 05/24/2020 PCP: Charlott Rakes, MD  Brief Narrative: 64 year old female with history of COPD, tobacco abuse, CVA with right hemiplegia, type 2 diabetes mellitus, hypertension sent to the ED by PCP due to acute kidney injury along with ongoing nausea, vomiting, very poor oral intake for 3 to 6 months.  Vomiting worse in the morning, also reports diarrhea for the last several weeks, denies any abdominal pain.  Also history of 40 pound weight loss -Work-up in the ED noted acute kidney injury of creatinine greater than 4, severe hypokalemia, hypocalcemia, hypomagnesemia, albumin of 1.9, normal LFTs, hemoglobin of 9.8. -CT abdomen pelvis without contrast noted sigmoid diverticulitis, emphysema and hepatomegaly.   Assessment & Plan:   Nausea vomiting and diarrhea 40 pound weight loss -Clinically do not suspect acute diverticulitis, in the absence of abdominal pain or tenderness, CT was without contrast -For now on empiric ceftriaxone and Flagyl, continue this today -GI consulting, plan for endoscopy today -Patient also has never had a colonoscopy  Acute kidney injury on chronic kidney disease stage IIIa -Baseline creatinine was 1.79 in 11/2018, this was greater than 4 on admission yesterday -Likely secondary to ongoing GI losses, worsened by HCTZ and lisinopril use -ACE inhibitor on hold, CT without hydronephrosis -Continue IV fluid today, monitor urine output -BMP in a.m.  Severe hypokalemia Hypomagnesemia -Due to GI losses, replace and recheck  Normocytic anemia -Follow-up anemia panel  History of CVA  Tobacco abuse COPD  Diabetes mellitus -Hold glipizide, CBGs are stable, continue sliding scale insulin -Hemoglobin A1c is 7.9  Hypertension -Continue Coreg, hold lisinopril  Severe protein calorie malnutrition -40 pound weight loss, albumin of 1.9 -RD consult  DVT prophylaxis: Add Lovenox after  endoscopy Code Status: Full code Family Communication: Daughter at bedside Disposition Plan:  Status is: Inpatient  Remains inpatient appropriate because:Inpatient level of care appropriate due to severity of illness   Dispo: The patient is from: Home              Anticipated d/c is to: Home              Anticipated d/c date is: > 3 days              Patient currently is not medically stable to d/c.  Consultants:   Gastroenterology   Procedures:   Antimicrobials:    Subjective: -Vomited numerous times last night, feels sick to her stomach, this is worse in the mornings  Objective: Vitals:   05/25/20 0500 05/25/20 0600 05/25/20 0930 05/25/20 0937  BP: (!) 153/63 136/64 (!) 179/79 (!) 179/79  Pulse: 72 72 67 64  Resp:    18  Temp:      TempSrc:      SpO2: 94% 95%  98%  Height:       No intake or output data in the 24 hours ending 05/25/20 1103 There were no vitals filed for this visit.  Examination:  General exam: Pleasant female appears much older than stated age, awake alert oriented x3 Respiratory system: Clear to auscultation. Respiratory effort normal. Cardiovascular system: S1 & S2 heard, RRR  Gastrointestinal system: Abdomen is nondistended, soft and nontender.Normal bowel sounds heard. Central nervous system: Alert and oriented. No focal neurological deficits. Extremities: Symmetric 5 x 5 power. Skin: No rashes, lesions or ulcers Psychiatry: Judgement and insight appear normal. Mood & affect appropriate.     Data Reviewed:   CBC: Recent Labs  Lab 05/24/20  1326 05/25/20 0245 05/25/20 1053  WBC 16.5* 13.5*  --   NEUTROABS  --  10.8*  --   HGB 10.4* 9.8* 9.5*  HCT 32.8* 31.5* 28.0*  MCV 86.8 90.3  --   PLT 440* 377  --    Basic Metabolic Panel: Recent Labs  Lab 05/23/20 1656 05/24/20 1326 05/24/20 1400 05/25/20 0032 05/25/20 0245 05/25/20 1053  NA 144 140  --  143 141 143  K 2.6* 2.5*  --  2.3* 2.4* 2.5*  CL 102 100  --  107 106 106   CO2 25 22  --  24 21*  --   GLUCOSE 179* 280*  --  176* 205* 128*  BUN 53* 51*  --  44* 44* 38*  CREATININE 4.01* 4.20*  --  3.56* 3.47* 3.60*  CALCIUM 7.0* 6.7*  --  6.2* 6.4*  --   MG  --   --  1.4*  --  1.9  --   PHOS  --   --   --  5.6* 5.2*  --    GFR: Estimated Creatinine Clearance: 16.4 mL/min (A) (by C-G formula based on SCr of 3.6 mg/dL (H)). Liver Function Tests: Recent Labs  Lab 05/23/20 1656 05/24/20 1326 05/25/20 0245  AST 4 11* 10*  ALT 4 8 9   ALKPHOS 118 91 75  BILITOT <0.2 0.4 0.4  PROT 5.8* 6.1* 5.4*  ALBUMIN 3.0* 2.2* 1.9*   Recent Labs  Lab 05/24/20 1326  LIPASE 26   No results for input(s): AMMONIA in the last 168 hours. Coagulation Profile: No results for input(s): INR, PROTIME in the last 168 hours. Cardiac Enzymes: Recent Labs  Lab 05/25/20 0032  CKTOTAL 69   BNP (last 3 results) No results for input(s): PROBNP in the last 8760 hours. HbA1C: Recent Labs    05/25/20 0032 05/25/20 0245  HGBA1C 7.8* 7.9*   CBG: Recent Labs  Lab 05/24/20 2228 05/25/20 0040 05/25/20 0044 05/25/20 0408 05/25/20 0908  GLUCAP 130* 181* 184* 189* 123*   Lipid Profile: No results for input(s): CHOL, HDL, LDLCALC, TRIG, CHOLHDL, LDLDIRECT in the last 72 hours. Thyroid Function Tests: Recent Labs    05/25/20 0245  TSH 3.049   Anemia Panel: Recent Labs    05/25/20 0245  VITAMINB12 218  FOLATE 9.1  FERRITIN 147  TIBC 165*  IRON 22*  RETICCTPCT 1.7   Urine analysis:    Component Value Date/Time   COLORURINE YELLOW 05/24/2020 1324   APPEARANCEUR HAZY (A) 05/24/2020 1324   LABSPEC 1.012 05/24/2020 1324   PHURINE 6.0 05/24/2020 1324   GLUCOSEU 50 (A) 05/24/2020 1324   HGBUR NEGATIVE 05/24/2020 1324   BILIRUBINUR NEGATIVE 05/24/2020 1324   KETONESUR NEGATIVE 05/24/2020 1324   PROTEINUR >=300 (A) 05/24/2020 1324   UROBILINOGEN 1.0 10/22/2013 1626   NITRITE NEGATIVE 05/24/2020 1324   LEUKOCYTESUR NEGATIVE 05/24/2020 1324   Sepsis  Labs: @LABRCNTIP (procalcitonin:4,lacticidven:4)  ) Recent Results (from the past 240 hour(s))  SARS Coronavirus 2 by RT PCR (hospital order, performed in [facility] hospital lab) Nasopharyngeal Nasopharyngeal Swab     Status: None   Collection Time: 05/24/20  5:00 PM   Specimen: Nasopharyngeal Swab  Result Value Ref Range Status   SARS Coronavirus 2 NEGATIVE NEGATIVE Final    Comment: (NOTE) SARS-CoV-2 target nucleic acids are NOT DETECTED.  The SARS-CoV-2 RNA is generally detectable in upper and lower respiratory specimens during the acute phase of infection. The lowest concentration of SARS-CoV-2 viral copies this assay can detect  is 250 copies / mL. A negative result does not preclude SARS-CoV-2 infection and should not be used as the sole basis for treatment or other patient management decisions.  A negative result may occur with improper specimen collection / handling, submission of specimen other than nasopharyngeal swab, presence of viral mutation(s) within the areas targeted by this assay, and inadequate number of viral copies (<250 copies / mL). A negative result must be combined with clinical observations, patient history, and epidemiological information.  Fact Sheet for Patients:   StrictlyIdeas.no  Fact Sheet for Healthcare Providers: BankingDealers.co.za  This test is not yet approved or  cleared by the [place] FDA and has been authorized for detection and/or diagnosis of SARS-CoV-2 by FDA under an Emergency Use Authorization (EUA).  This EUA will remain in effect (meaning this test can be used) for the duration of the COVID-19 declaration under Section 564(b)(1) of the Act, 21 U.S.C. section 360bbb-3(b)(1), unless the authorization is terminated or revoked sooner.  Performed at [facility], [place] [address], [place], [place] [address]   C Difficile Quick Screen w PCR reflex     Status: None   Collection  Time: 05/25/20 12:32 AM   Specimen: Stool  Result Value Ref Range Status   C Diff antigen NEGATIVE NEGATIVE Final   C Diff toxin NEGATIVE NEGATIVE Final   C Diff interpretation No C. difficile detected.  Final    Comment: Performed at [facility], [place] [address], [place],  [address]         Radiology Studies: CT Abdomen Pelvis Wo Contrast  Result Date: 05/24/2020 CLINICAL DATA:  Nausea, vomiting, greater than 40 pound weight loss, acute renal injury. EXAM: CT ABDOMEN AND PELVIS WITHOUT CONTRAST TECHNIQUE: Multidetector CT imaging of the abdomen and pelvis was performed following the standard protocol without IV contrast. COMPARISON:  None. FINDINGS: Lower chest: Lingular linear atelectasis. Paraseptal cystic changes of bilateral lower lobes. At least mild 2 vessel coronary artery calcifications. Hepatobiliary: The liver is enlarged measuring up to 20 cm. No focal liver abnormality is seen. No gallstones, gallbladder wall thickening, or biliary dilatation. Pancreas: Unremarkable. No pancreatic ductal dilatation or surrounding inflammatory changes. Spleen: Normal in size without focal abnormality. Adrenals/Urinary Tract: No adrenal nodule bilaterally. Bilateral kidneys enhance symmetrically. No hydronephrosis. No hydroureter. The urinary bladder is unremarkable. Stomach/Bowel: Stomach is within normal limits. Appendix appears normal. No evidence of small bowel wall thickening, distention, or inflammatory changes. There is mid sigmoid wall thickening with associated perisigmoid fat stranding and suggestion of been inflated diverticula open (7:91, 3:67). Scattered colonic diverticulosis that is more prominent within the sigmoid colon. The rectum is underdistended with suggestion of mid rectal wall thickening. No bowel obstruction. Vascular/Lymphatic: No abdominal aorta or iliac aneurysm. Moderate to severe atherosclerotic plaque of the aorta and its branches. No abdominal, pelvic, or  inguinal lymphadenopathy. Reproductive: Uterus and bilateral adnexa are unremarkable. Other: No intraperitoneal free fluid. No intraperitoneal free gas. No organized fluid collection. Musculoskeletal: No abdominal wall hernia or abnormality No suspicious lytic or blastic osseous lesions. No acute displaced fracture. Multilevel degenerative changes of the spine. Degenerative changes of the right hip. IMPRESSION: 1. Uncomplicated sigmoid diverticulosis with acute diverticulitis. No associated bowel perforation or abscess formation. Post antibiotic treatment and after resolution of inflammatory changes, recommend colonoscopy to evaluate for underlying malignancy given patient's symptoms. 2. Other imaging findings of potential clinical significance: Paraseptal cystic changes of bilateral lower lobes which may represent emphysema versus pulmonary fibrosis. Hepatomegaly. Electronically  Signed   By: Iven Finn M.D.   On: 05/24/2020 18:49   DG Chest 2 View  Result Date: 05/24/2020 CLINICAL DATA:  Hypertension, emesis, loss of appetite with nausea EXAM: CHEST - 2 VIEW COMPARISON:  CT 04/29/2009, radiograph 10/22/2013 FINDINGS: Chronically coarsened interstitial changes and subpleural reticular opacities compatible with the extensive paraseptal and centrilobular emphysematous changes seen on comparison CT. These findings are overall quite similar to the comparison radiograph in 2015 with some persistent mild hyperinflation and increased AP diameter of the chest. No focal consolidative opacity, convincing features of edema, pneumothorax or visible effusion. The aorta is calcified. The remaining cardiomediastinal contours are unremarkable. No acute osseous or soft tissue abnormality. Telemetry leads overlie the chest. IMPRESSION: 1. Chronic hyperinflation, coarsened interstitial changes and subpleural reticular opacities compatible with the extensive paraseptal and centrilobular emphysematous changes seen on comparison  studies and not significantly changed since 2015. 2. No acute cardiopulmonary disease. 3.  Aortic Atherosclerosis (ICD10-I70.0). Electronically Signed   By: Lovena Le M.D.   On: 05/24/2020 18:05   CT Head Wo Contrast  Result Date: 05/24/2020 CLINICAL DATA:  Nausea and vomiting EXAM: CT HEAD WITHOUT CONTRAST TECHNIQUE: Contiguous axial images were obtained from the base of the skull through the vertex without intravenous contrast. COMPARISON:  MRI 10/23/2013 FINDINGS: Brain: No acute territorial infarction, hemorrhage or intracranial mass. The ventricles are nonenlarged. Minimal white matter hypodensity likely chronic small vessel ischemic change. Stable ventricle size. Vascular: No hyperdense vessels.  Carotid vascular calcification. Skull: Normal. Negative for fracture or focal lesion. Sinuses/Orbits: No acute finding. Other: None IMPRESSION: 1. No CT evidence for acute intracranial abnormality. 2. Minimal small vessel ischemic changes of the white matter. Electronically Signed   By: Donavan Foil M.D.   On: 05/24/2020 20:45        Scheduled Meds: . amLODipine  10 mg Oral Daily  . atorvastatin  40 mg Oral QPM  . carvedilol  3.125 mg Oral BID WC  . DULoxetine  60 mg Oral Daily  . insulin aspart  0-9 Units Subcutaneous Q4H  . sodium chloride flush  3 mL Intravenous Q12H   Continuous Infusions: . 0.9 % NaCl with KCl 40 mEq / L 100 mL/hr at 05/25/20 0937  . cefTRIAXone (ROCEPHIN)  IV    . metronidazole Stopped (05/25/20 0507)     LOS: 1 day    Time spent: 54min    Domenic Polite, MD [facility]   05/25/2020, 11:03 AM

## 2020-05-25 NOTE — ED Notes (Signed)
K and iCa criticals given to Roel Cluck, MD

## 2020-05-25 NOTE — H&P (View-Only) (Signed)
[facility] Consult: 8:35 AM 05/25/2020  LOS: 1 day    Referring Provider: Dr Fanny Bien  Primary Care Physician:  Charlott Rakes, MD Primary Gastroenterologist:  unassigned  Dtr Chapman Moss [phone].     Reason for Consultation:    Chronic N/V, diarrhea   HPI: Tina Gomez is a 64 y.o. female.  PMH CVA treated w TPA in 10/2013, on ASA 325 no anti-platelet meds.  T2DM on oral agents.  Htn.  HLD.  Benign inflamatory, hyperplastic changes on Laryngeal bxs with removal of growth in 2011.    Referred to ED by PMD.  Concern for AKI in setting of progressive nausea, non-bloody/bilious emesis over 3 months.  Initially ovcurring in AM, now multiple episodes daily and nocturnal hit or miss as to tolerating po.  Carried a diagnosis of GERD though she has never had any radiologic or endoscopic studies to confirm this.  She been taking omeprazole and then pantoprazole.  When the symptoms became persistent, she started reading on the Internet and read about PPI not being something you should be on long-term so she stopped her Protonix about 4 weeks ago.  This made no difference as to the improvement or progression of the nausea and vomiting.  Denies dysphagia.  About a month ago she developed frequent, small volume squirts of watery, nonbloody stool.  She occasionally has some mid to upper abdominal discomfort but this is after she has been retching.  No significant abdominal pain and no pain in the lower abdomen.  In all she has lost close to 40#.   Labs confirm AKI, hypokalemia, hypocalemia, hypomagnesemia, hyperglycemia to 280, anion gap. Low albumin, O/w normal LFTs and normal Lipase.   Hgb 9.8, normal MCV.  WBC 16.5. And H. pylori breath test has been obtained and is positive.  CTAP w/o contrast:  Sigmoid  diverticulitis w/o abscess or perf.  rec repeat CT after abx to assure no malignancy.  Cystic changes in LL of lung bil, empysema vs fibrosis.  Hepatomegaly.    No antibiotic exposure in the last 6 months.  No exposure to toddlers or kids in diapers. Patient does not use NSAIDs other than the full dose aspirin. No current or past history of alcohol use.  Smokes 1 pack/day.  No family history of ulcer disease, colon cancer, colon polyps.  Her father had lung cancer which metastasized to his liver.  Past Medical History:  Diagnosis Date  . Hypertension   . Stroke [facility])     Past Surgical History:  Procedure Laterality Date  . CESAREAN SECTION      Prior to Admission medications   Medication Sig Start Date End Date Taking? Authorizing Provider  amLODipine (NORVASC) 10 MG tablet Take 1 tablet (10 mg total) by mouth daily. 05/23/20  Yes Charlott Rakes, MD  aspirin 325 MG tablet Take 1 tablet (325 mg total) by mouth daily. 11/25/18  Yes McClung, Angela M, PA-C  atorvastatin (LIPITOR) 40 MG tablet TAKE 1 TABLET BY MOUTH DAILY AT 6 PM. Patient taking differently: Take 40 mg  by mouth every evening.  05/23/20  Yes Charlott Rakes, MD  cetirizine (ZYRTEC) 10 MG tablet Take 1 tablet (10 mg total) by mouth daily. Patient taking differently: Take 10 mg by mouth daily as needed for allergies or rhinitis.  11/13/16  Yes Newlin, Charlane Ferretti, MD  metFORMIN (GLUCOPHAGE) 500 MG tablet TAKE 2 TABLETS BY MOUTH 2 TIMES DAILY WITH A MEAL. Patient taking differently: Take 1,000 mg by mouth 2 (two) times daily with a meal.  05/23/20  Yes Newlin, Enobong, MD  Blood Glucose Monitoring Suppl (BLOOD GLUCOSE METER) kit Use as instructed 11/01/13   Reyne Dumas, MD  Blood Glucose Monitoring Suppl (TRUE METRIX METER) DEVI 1 each by Does not apply route 3 (three) times daily before meals. 03/10/18   Charlott Rakes, MD  carvedilol (COREG) 3.125 MG tablet Take 1 tablet (3.125 mg total) by mouth 2 (two) times daily with a meal.  05/23/20   Charlott Rakes, MD  DULoxetine (CYMBALTA) 60 MG capsule Take 1 capsule (60 mg total) by mouth daily. 05/23/20   Charlott Rakes, MD  glipiZIDE (GLUCOTROL) 10 MG tablet Take 1 tablet (10 mg total) by mouth 2 (two) times daily before a meal. 05/23/20   Charlott Rakes, MD  glucose blood (TRUETEST TEST) test strip Use as instructed 03/10/18   Charlott Rakes, MD  Lancets (FREESTYLE) lancets Use as instructed 11/01/13   Reyne Dumas, MD  lisinopril-hydrochlorothiazide (ZESTORETIC) 20-12.5 MG tablet Take 2 tablets by mouth daily. 05/23/20   Charlott Rakes, MD  Olopatadine HCl (PATADAY) 0.2 % SOLN Apply 1 drop to eye daily. Patient not taking: Reported on 05/24/2020 11/13/16   Charlott Rakes, MD  pantoprazole (PROTONIX) 40 MG tablet Take 1 tablet (40 mg total) by mouth daily. Patient not taking: Reported on 05/24/2020 03/09/19   Charlott Rakes, MD  promethazine (PHENERGAN) 25 MG suppository Place 1 suppository (25 mg total) rectally every 8 (eight) hours as needed for nausea or vomiting. 05/23/20   Charlott Rakes, MD  triamcinolone cream (KENALOG) 0.1 % Apply 1 application topically 2 (two) times daily. Patient not taking: Reported on 05/24/2020 02/14/16   Charlott Rakes, MD  TRUEPLUS LANCETS 28G MISC 1 each by Does not apply route 3 (three) times daily before meals. 03/10/18   Charlott Rakes, MD    Scheduled Meds: . amLODipine  10 mg Oral Daily  . atorvastatin  40 mg Oral QPM  . carvedilol  3.125 mg Oral BID WC  . DULoxetine  60 mg Oral Daily  . insulin aspart  0-9 Units Subcutaneous Q4H  . sodium chloride flush  3 mL Intravenous Q12H   Infusions: . 0.9 % NaCl with KCl 40 mEq / L    . cefTRIAXone (ROCEPHIN)  IV    . metronidazole Stopped (05/25/20 0507)   PRN Meds: acetaminophen **OR** acetaminophen, HYDROmorphone (DILAUDID) injection, ondansetron **OR** ondansetron (ZOFRAN) IV, promethazine   Allergies as of 05/24/2020 - Review Complete 05/24/2020  Allergen Reaction Noted  . Codeine  Nausea And Vomiting 10/22/2013  . Adhesive [tape] Other (See Comments) 05/24/2020    Family History  Problem Relation Age of Onset  . Stroke Mother   . Hypertension Mother   . Hyperlipidemia Mother   . Hypertension Father   . Hyperlipidemia Father     Social History   Socioeconomic History  . Marital status: Divorced    Spouse name: Not on file  . Number of children: Not on file  . Years of education: Not on file  . Highest education level: Not on file  Occupational History  . Not on file  Tobacco Use  . Smoking status: Current Every Day Smoker    Packs/day: 0.25    Years: 10.00    Pack years: 2.50    Types: Cigarettes  . Smokeless tobacco: Never Used  . Tobacco comment: 5 cigs daily  Substance and Sexual Activity  . Alcohol use: No  . Drug use: No  . Sexual activity: Not on file  Other Topics Concern  . Not on file  Social History Narrative  . Not on file   Social Determinants of Health   Financial Resource Strain:   . Difficulty of Paying Living Expenses: Not on file  Food Insecurity:   . Worried About Charity fundraiser in the Last Year: Not on file  . Ran Out of Food in the Last Year: Not on file  Transportation Needs:   . Lack of Transportation (Medical): Not on file  . Lack of Transportation (Non-Medical): Not on file  Physical Activity:   . Days of Exercise per Week: Not on file  . Minutes of Exercise per Session: Not on file  Stress:   . Feeling of Stress : Not on file  Social Connections:   . Frequency of Communication with Friends and Family: Not on file  . Frequency of Social Gatherings with Friends and Family: Not on file  . Attends Religious Services: Not on file  . Active Member of Clubs or Organizations: Not on file  . Attends Archivist Meetings: Not on file  . Marital Status: Not on file  Intimate Partner Violence:   . Fear of Current or Ex-Partner: Not on file  . Emotionally Abused: Not on file  . Physically Abused: Not on  file  . Sexually Abused: Not on file    REVIEW OF SYSTEMS: Constitutional: Feels tired and worn out. ENT:  No nose bleeds Pulm: No shortness of breath.  Occasional nonpurulent cough. CV:  No palpitations, no chest pressure or angina.  In the last few weeks has developed right greater than left lower leg swelling. GU:  No hematuria, no frequency GI: See HPI. Heme: Develops purpura easily with minor trauma on her arms but no excessive bleeding or significant bruising. Transfusions: None. Neuro: Slight residual right lower extremity weakness after her stroke no headaches, no peripheral tingling or numbness.  No syncope, no seizures. ID: No fevers. Derm:  No itching, no rash or sores.  Endocrine: At home she checks her blood sugars and they run anywhere from 1 15-1 90s.  No sweats or chills.  No polyuria or dysuria Immunization: Has been vaccinated for COVID-19. Travel:  None beyond local counties in last few months.    PHYSICAL EXAM: Vital signs in last 24 hours: Vitals:   05/25/20 0500 05/25/20 0600  BP: (!) 153/63 136/64  Pulse: 72 72  Resp:    Temp:    SpO2: 94% 95%   Wt Readings from Last 3 Encounters:  05/23/20 87.1 kg  11/25/18 97.5 kg  03/10/18 100.2 kg    General: Patient looks chronically unwell.  Looks slightly older than stated age. Head: No facial asymmetry or swelling. Eyes: No scleral icterus.  No conjunctival pallor.  EOMI Ears: Not hard of hearing Nose: No discharge.  Sounds a bit congested. Mouth: Upper denture in place.  Mucosa is pink, moist, clear.  Tongue midline. Neck: No JVD, no masses, no thyromegaly. Lungs: Clear bilaterally with reduced breath sounds on the right side.  No rhonchi, no crackles.  No labored breathing.  Vocal quality is hoarse/raspy Heart: RRR.  No MRG.  S1, S2 present. Abdomen: Nontender, nondistended.  Active bowel sounds.  No HSM, masses, bruits, hernias.   Rectal: Deferred Musc/Skeltl: No joint redness, swelling or gross  deformity. Extremities: Slight, nonpitting swelling in the feet bilaterally. Neurologic: Alert.  Oriented x3.  Good historian.  No tremors.  Moves all 4 limbs, strength not tested Skin: A few purpura on her forearms.  No telangiectasia on the trunk. Tattoos: None observed Nodes: No cervical adenopathy Psych: Affect slightly blunted but pleasant, cooperative and calm.  Speech fluid.  Intake/Output from previous day: No intake/output data recorded. Intake/Output this shift: No intake/output data recorded.  LAB RESULTS: Recent Labs    05/24/20 1326 05/25/20 0245  WBC 16.5* 13.5*  HGB 10.4* 9.8*  HCT 32.8* 31.5*  PLT 440* 377   BMET Lab Results  Component Value Date   NA 141 05/25/2020   NA 143 05/25/2020   NA 140 05/24/2020   K 2.4 (LL) 05/25/2020   K 2.3 (LL) 05/25/2020   K 2.5 (LL) 05/24/2020   CL 106 05/25/2020   CL 107 05/25/2020   CL 100 05/24/2020   CO2 21 (L) 05/25/2020   CO2 24 05/25/2020   CO2 22 05/24/2020   GLUCOSE 205 (H) 05/25/2020   GLUCOSE 176 (H) 05/25/2020   GLUCOSE 280 (H) 05/24/2020   BUN 44 (H) 05/25/2020   BUN 44 (H) 05/25/2020   BUN 51 (H) 05/24/2020   CREATININE 3.47 (H) 05/25/2020   CREATININE 3.56 (H) 05/25/2020   CREATININE 4.20 (H) 05/24/2020   CALCIUM 6.4 (LL) 05/25/2020   CALCIUM 6.2 (LL) 05/25/2020   CALCIUM 6.7 (L) 05/24/2020   LFT Recent Labs    05/23/20 1656 05/24/20 1326 05/25/20 0245  PROT 5.8* 6.1* 5.4*  ALBUMIN 3.0* 2.2* 1.9*  AST 4 11* 10*  ALT _0 ALKPHOS 118 91 75  BILITOT <0.2 0.4 0.4   PT/INR Lab Results  Component Value Date   INR 0.92 10/22/2013   INR 0.9 04/29/2009   Hepatitis Panel No results for input(s): HEPBSAG, HCVAB, HEPAIGM, HEPBIGM in the last 72 hours. C-Diff No components found for: CDIFF Lipase     Component Value Date/Time   LIPASE 26 05/24/2020 1326    RADIOLOGY STUDIES: CT Abdomen Pelvis Wo Contrast  Result Date: 05/24/2020 CLINICAL DATA:  Nausea, vomiting, greater than 40  pound weight loss, acute renal injury. EXAM: CT ABDOMEN AND PELVIS WITHOUT CONTRAST TECHNIQUE: Multidetector CT imaging of the abdomen and pelvis was performed following the standard protocol without IV contrast. COMPARISON:  None. FINDINGS: Lower chest: Lingular linear atelectasis. Paraseptal cystic changes of bilateral lower lobes. At least mild 2 vessel coronary artery calcifications. Hepatobiliary: The liver is enlarged measuring up to 20 cm. No focal liver abnormality is seen. No gallstones, gallbladder wall thickening, or biliary dilatation. Pancreas: Unremarkable. No pancreatic ductal dilatation or surrounding inflammatory changes. Spleen: Normal in size without focal abnormality. Adrenals/Urinary Tract: No adrenal nodule bilaterally. Bilateral kidneys enhance symmetrically. No hydronephrosis. No hydroureter. The urinary bladder is unremarkable. Stomach/Bowel: Stomach is within normal limits. Appendix appears normal. No evidence of small bowel wall thickening, distention, or inflammatory changes. There is mid sigmoid wall thickening with associated perisigmoid fat stranding and suggestion of been inflated diverticula open (7:91, 3:67). Scattered colonic diverticulosis that is more prominent within the sigmoid colon. The rectum is underdistended with suggestion of mid rectal wall thickening. No bowel obstruction. Vascular/Lymphatic: No abdominal aorta or iliac  aneurysm. Moderate to severe atherosclerotic plaque of the aorta and its branches. No abdominal, pelvic, or inguinal lymphadenopathy. Reproductive: Uterus and bilateral adnexa are unremarkable. Other: No intraperitoneal free fluid. No intraperitoneal free gas. No organized fluid collection. Musculoskeletal: No abdominal wall hernia or abnormality No suspicious lytic or blastic osseous lesions. No acute displaced fracture. Multilevel degenerative changes of the spine. Degenerative changes of the right hip. IMPRESSION: 1. Uncomplicated sigmoid  diverticulosis with acute diverticulitis. No associated bowel perforation or abscess formation. Post antibiotic treatment and after resolution of inflammatory changes, recommend colonoscopy to evaluate for underlying malignancy given patient's symptoms. 2. Other imaging findings of potential clinical significance: Paraseptal cystic changes of bilateral lower lobes which may represent emphysema versus pulmonary fibrosis. Hepatomegaly. Electronically Signed   By: Iven Finn M.D.   On: 05/24/2020 18:49   DG Chest 2 View  Result Date: 05/24/2020 CLINICAL DATA:  Hypertension, emesis, loss of appetite with nausea EXAM: CHEST - 2 VIEW COMPARISON:  CT 04/29/2009, radiograph 10/22/2013 FINDINGS: Chronically coarsened interstitial changes and subpleural reticular opacities compatible with the extensive paraseptal and centrilobular emphysematous changes seen on comparison CT. These findings are overall quite similar to the comparison radiograph in 2015 with some persistent mild hyperinflation and increased AP diameter of the chest. No focal consolidative opacity, convincing features of edema, pneumothorax or visible effusion. The aorta is calcified. The remaining cardiomediastinal contours are unremarkable. No acute osseous or soft tissue abnormality. Telemetry leads overlie the chest. IMPRESSION: 1. Chronic hyperinflation, coarsened interstitial changes and subpleural reticular opacities compatible with the extensive paraseptal and centrilobular emphysematous changes seen on comparison studies and not significantly changed since 2015. 2. No acute cardiopulmonary disease. 3.  Aortic Atherosclerosis (ICD10-I70.0). Electronically Signed   By: Lovena Le M.D.   On: 05/24/2020 18:05   CT Head Wo Contrast  Result Date: 05/24/2020 CLINICAL DATA:  Nausea and vomiting EXAM: CT HEAD WITHOUT CONTRAST TECHNIQUE: Contiguous axial images were obtained from the base of the skull through the vertex without intravenous  contrast. COMPARISON:  MRI 10/23/2013 FINDINGS: Brain: No acute territorial infarction, hemorrhage or intracranial mass. The ventricles are nonenlarged. Minimal white matter hypodensity likely chronic small vessel ischemic change. Stable ventricle size. Vascular: No hyperdense vessels.  Carotid vascular calcification. Skull: Normal. Negative for fracture or focal lesion. Sinuses/Orbits: No acute finding. Other: None IMPRESSION: 1. No CT evidence for acute intracranial abnormality. 2. Minimal small vessel ischemic changes of the white matter. Electronically Signed   By: Donavan Foil M.D.   On: 05/24/2020 20:45     IMPRESSION:    *  3 months of bilious emesis, progressive inablity to tolerate po later loose stools.  CT showing Sigmoid diverticulitis.  Serum H. pylori breath test is positive.  Although her white counts are elevated, she has no significant abdominal pain, no fever so not clear if she actually has diverticulitis.  R/o PUD versus uncontrolled GERD versus underlying malignancy.   Day 2 Rocephin, Flagyl  *  Normocytic anemia.  Low iron 22, low TIBC, ferritin 147, low need normal iron sat.  Folate and B12 ok.    *   AKI, improved w IVF  *   T2 DM.  avg blood glucose 180 per labs.     *   Hypokalemia.   Hypomagnesemia, corrected.   Hypocalcemia.   *   Wt loss 40#, PCM of undefined severity per labs w albumin 1.9  *   CVA 2015.  On full dose ASA  PLAN:     *  EGD planned for later this morning. She does need colonoscopy but with the question of diverticulitis, and her significant nausea vomiting.  It may not be safe to pursue colonoscopy right now and I doubt she would tolerate the bowel prep anyway.  *   Not currently receiving PPI and will hold off on ordering until she has had her EGD to determine the dosing of this med.  Azucena Freed  05/25/2020, 8:35 AM Phone [phone]  Addendum 1115 AM Due to low potassium, the EGD is deferred til tmrw AM Starting on Protonix 40  mg IV bid and reglan 10 mg IV q 6 hours x 3 doses.  Fults for clears, npo after MN.

## 2020-05-25 NOTE — Anesthesia Preprocedure Evaluation (Addendum)
Anesthesia Evaluation  Patient identified by MRN, date of birth, ID band Patient awake    Reviewed: Allergy & Precautions, NPO status , Patient's Chart, lab work & pertinent test results, reviewed documented beta blocker date and time   History of Anesthesia Complications (+) MALIGNANT HYPERTHERMIA  Airway Mallampati: II  TM Distance: >3 FB Neck ROM: Full    Dental  (+) Edentulous Upper, Edentulous Lower   Pulmonary COPD, Current Smoker and Patient abstained from smoking.,    Pulmonary exam normal breath sounds clear to auscultation       Cardiovascular hypertension, Pt. on medications and Pt. on home beta blockers +CHF (grade 1 diastolic dysfunction)  Normal cardiovascular exam Rhythm:Regular Rate:Normal  Echo 2015: - Procedure narrative: Transthoracic echocardiography. Image  quality was suboptimal, due to poor sound transmission.  - Left ventricle: Mild to moderate concentric left  ventricular hypertrophy. The cavity size was at the upper  limits of normal. Systolic function was normal. The  estimated ejection fraction was in the range of 60% to  65%. Wall motion was normal; there were no regional wall  motion abnormalities. Doppler parameters are consistent  with abnormal left ventricular relaxation (grade 1  diastolic dysfunction). Doppler parameters are consistentwith high ventricular filling pressure.  - Left atrium: The atrium was mildly dilated.  - Atrial septum: No defect or patent foramen ovale was  identified.  - Systemic veins: IVC dilated with normal respirophasic  variation, estimated CVP 8 mmHg.    Very hypertensive in preop- states she hasnt been getting her BP meds because of her vomiting   Neuro/Psych CVA (R hemiplegia), Residual Symptoms negative psych ROS   GI/Hepatic Neg liver ROS, GERD  Medicated and Controlled,Nonbloody, bilious nausea and vomiting for 3 months   Endo/Other   diabetes, Poorly Controlled, Type 2, Oral Hypoglycemic AgentsObesity BMI 35 a1c 7.9  Renal/GU Renal Insufficiency, CRF and ARFRenal diseaseCr 3.6 Acute kidney injury on chronic kidney disease stage IIIa -Baseline creatinine was 1.79 in 11/2018, this was greater than 4 on admission yesterday -Likely secondary to ongoing GI losses, worsened by HCTZ and lisinopril use  negative genitourinary   Musculoskeletal negative musculoskeletal ROS (+)   Abdominal (+) + obese,   Peds  Hematology  (+) Blood dyscrasia, anemia , hct 28   Anesthesia Other Findings sent to the ED by PCP due to acute kidney injury along with ongoing nausea, vomiting, very poor oral intake for 3 to 6 months.  Vomiting worse in the morning, also reports diarrhea for the last several weeks, denies any abdominal pain.  Also history of 40 pound weight loss -Work-up in the ED noted acute kidney injury of creatinine greater than 4, severe hypokalemia, hypocalcemia, hypomagnesemia, albumin of 1.9, normal LFTs, hemoglobin of 9.8.   Severe hypoK/mag likely d/t GI losses- being repleted  Reproductive/Obstetrics negative OB ROS                            Anesthesia Physical Anesthesia Plan  ASA: IV  Anesthesia Plan: General   Post-op Pain Management:    Induction: Intravenous, Rapid sequence and Cricoid pressure planned  PONV Risk Score and Plan: Ondansetron, Dexamethasone and Treatment may vary due to age or medical condition  Airway Management Planned: Oral ETT  Additional Equipment: None  Intra-op Plan:   Post-operative Plan: Extubation in OR  Informed Consent: I have reviewed the patients History and Physical, chart, labs and discussed the procedure including the risks, benefits and alternatives  for the proposed anesthesia with the patient or authorized representative who has indicated his/her understanding and acceptance.     Dental advisory given  Plan Discussed with:  CRNA  Anesthesia Plan Comments: (K up to 3.5 on istat in preop (from 2.9 this AM). Will replete calcium intraop D/w pt GA/ETT/RSI given that she is still having bilious vomiting this morning. )       Anesthesia Quick Evaluation

## 2020-05-25 NOTE — ED Notes (Signed)
Report called. Pt to be admitted to 3E-14.

## 2020-05-26 ENCOUNTER — Inpatient Hospital Stay (HOSPITAL_COMMUNITY): Payer: Medicaid Other | Admitting: Certified Registered Nurse Anesthetist

## 2020-05-26 ENCOUNTER — Encounter (HOSPITAL_COMMUNITY): Payer: Self-pay | Admitting: Internal Medicine

## 2020-05-26 ENCOUNTER — Encounter (HOSPITAL_COMMUNITY)
Admission: EM | Disposition: A | Discharge status: Home / Self Care | Payer: Self-pay | Source: Home / Self Care | Attending: Internal Medicine

## 2020-05-26 HISTORY — PX: ESOPHAGOGASTRODUODENOSCOPY (EGD) WITH PROPOFOL: SHX5813

## 2020-05-26 LAB — COMPREHENSIVE METABOLIC PANEL
ALT: 9 U/L (ref 0–44)
AST: 12 U/L — ABNORMAL LOW (ref 15–41)
Albumin: 1.9 g/dL — ABNORMAL LOW (ref 3.5–5.0)
Alkaline Phosphatase: 85 U/L (ref 38–126)
Anion gap: 11 (ref 5–15)
BUN: 34 mg/dL — ABNORMAL HIGH (ref 8–23)
CO2: 21 mmol/L — ABNORMAL LOW (ref 22–32)
Calcium: 6.5 mg/dL — ABNORMAL LOW (ref 8.9–10.3)
Chloride: 111 mmol/L (ref 98–111)
Creatinine, Ser: 3.25 mg/dL — ABNORMAL HIGH (ref 0.44–1.00)
GFR calc Af Amer: 17 mL/min — ABNORMAL LOW (ref >60)
GFR calc non Af Amer: 14 mL/min — ABNORMAL LOW (ref >60)
Glucose, Bld: 128 mg/dL — ABNORMAL HIGH (ref 70–99)
Potassium: 2.9 mmol/L — ABNORMAL LOW (ref 3.5–5.1)
Sodium: 143 mmol/L (ref 135–145)
Total Bilirubin: 0.3 mg/dL (ref 0.3–1.2)
Total Protein: 5.6 g/dL — ABNORMAL LOW (ref 6.5–8.1)

## 2020-05-26 LAB — CBC
HCT: 31.6 % — ABNORMAL LOW (ref 36.0–46.0)
Hemoglobin: 10 g/dL — ABNORMAL LOW (ref 12.0–15.0)
MCH: 27.9 pg (ref 26.0–34.0)
MCHC: 31.6 g/dL (ref 30.0–36.0)
MCV: 88 fL (ref 80.0–100.0)
Platelets: 407 10*3/uL — ABNORMAL HIGH (ref 150–400)
RBC: 3.59 MIL/uL — ABNORMAL LOW (ref 3.87–5.11)
RDW: 13.9 % (ref 11.5–15.5)
WBC: 13 10*3/uL — ABNORMAL HIGH (ref 4.0–10.5)
nRBC: 0 % (ref 0.0–0.2)

## 2020-05-26 LAB — POCT I-STAT, CHEM 8
BUN: 30 mg/dL — ABNORMAL HIGH (ref 8–23)
Calcium, Ion: 0.88 mmol/L — CL (ref 1.15–1.40)
Chloride: 113 mmol/L — ABNORMAL HIGH (ref 98–111)
Creatinine, Ser: 3.3 mg/dL — ABNORMAL HIGH (ref 0.44–1.00)
Glucose, Bld: 123 mg/dL — ABNORMAL HIGH (ref 70–99)
HCT: 30 % — ABNORMAL LOW (ref 36.0–46.0)
Hemoglobin: 10.2 g/dL — ABNORMAL LOW (ref 12.0–15.0)
Potassium: 3.5 mmol/L (ref 3.5–5.1)
Sodium: 145 mmol/L (ref 135–145)
TCO2: 19 mmol/L — ABNORMAL LOW (ref 22–32)

## 2020-05-26 LAB — GLUCOSE, CAPILLARY
Glucose-Capillary: 117 mg/dL — ABNORMAL HIGH (ref 70–99)
Glucose-Capillary: 118 mg/dL — ABNORMAL HIGH (ref 70–99)
Glucose-Capillary: 124 mg/dL — ABNORMAL HIGH (ref 70–99)
Glucose-Capillary: 133 mg/dL — ABNORMAL HIGH (ref 70–99)
Glucose-Capillary: 148 mg/dL — ABNORMAL HIGH (ref 70–99)
Glucose-Capillary: 159 mg/dL — ABNORMAL HIGH (ref 70–99)
Glucose-Capillary: 162 mg/dL — ABNORMAL HIGH (ref 70–99)

## 2020-05-26 SURGERY — ESOPHAGOGASTRODUODENOSCOPY (EGD) WITH PROPOFOL
Anesthesia: General

## 2020-05-26 MED ORDER — CALCIUM CHLORIDE 10 % IV SOLN
INTRAVENOUS | Status: DC | PRN
  Administered 2020-05-26 (×2): 300 mg via INTRAVENOUS

## 2020-05-26 MED ORDER — PROSOURCE PLUS PO LIQD
30.0000 mL | Freq: Two times a day (BID) | ORAL | Status: DC
  Administered 2020-05-27: 30 mL via ORAL
  Filled 2020-05-26 (×2): qty 30

## 2020-05-26 MED ORDER — POTASSIUM CHLORIDE IN NACL 40-0.9 MEQ/L-% IV SOLN
INTRAVENOUS | Status: AC
  Filled 2020-05-26 (×4): qty 1000

## 2020-05-26 MED ORDER — BOOST / RESOURCE BREEZE PO LIQD CUSTOM
1.0000 | Freq: Three times a day (TID) | ORAL | Status: DC
  Administered 2020-05-26 – 2020-05-28 (×4): 1 via ORAL

## 2020-05-26 MED ORDER — ONDANSETRON HCL 4 MG/2ML IJ SOLN
INTRAMUSCULAR | Status: DC | PRN
  Administered 2020-05-26: 4 mg via INTRAVENOUS

## 2020-05-26 MED ORDER — SODIUM CHLORIDE 0.9 % IV SOLN: INTRAVENOUS | Status: DC | PRN

## 2020-05-26 MED ORDER — POTASSIUM CHLORIDE 10 MEQ/100ML IV SOLN
10.0000 meq | INTRAVENOUS | Status: AC
  Administered 2020-05-26 (×4): 10 meq via INTRAVENOUS
  Filled 2020-05-26 (×4): qty 100

## 2020-05-26 MED ORDER — HYDRALAZINE HCL 20 MG/ML IJ SOLN: 10.0000 mg | Freq: Four times a day (QID) | INTRAMUSCULAR | Status: DC | PRN

## 2020-05-26 MED ORDER — PROPOFOL 10 MG/ML IV BOLUS
INTRAVENOUS | Status: DC | PRN
  Administered 2020-05-26: 130 mg via INTRAVENOUS

## 2020-05-26 MED ORDER — FENTANYL CITRATE (PF) 100 MCG/2ML IJ SOLN
INTRAMUSCULAR | Status: DC | PRN
Start: 2020-05-26 — End: 2020-05-26
  Administered 2020-05-26: 50 ug via INTRAVENOUS

## 2020-05-26 MED ORDER — ADULT MULTIVITAMIN W/MINERALS CH
1.0000 | ORAL_TABLET | Freq: Every day | ORAL | Status: DC
  Administered 2020-05-26 – 2020-06-01 (×7): 1 via ORAL
  Filled 2020-05-26 (×7): qty 1

## 2020-05-26 MED ORDER — ENOXAPARIN SODIUM 30 MG/0.3ML ~~LOC~~ SOLN
30.0000 mg | SUBCUTANEOUS | Status: DC
  Administered 2020-05-26 – 2020-05-31 (×6): 30 mg via SUBCUTANEOUS
  Filled 2020-05-26 (×6): qty 0.3

## 2020-05-26 MED ORDER — SUCCINYLCHOLINE CHLORIDE 200 MG/10ML IV SOSY
PREFILLED_SYRINGE | INTRAVENOUS | Status: DC | PRN
  Administered 2020-05-26: 100 mg via INTRAVENOUS

## 2020-05-26 MED ORDER — LIDOCAINE 2% (20 MG/ML) 5 ML SYRINGE
INTRAMUSCULAR | Status: DC | PRN
  Administered 2020-05-26: 60 mg via INTRAVENOUS

## 2020-05-26 MED ORDER — PROMETHAZINE HCL 25 MG/ML IJ SOLN
12.5000 mg | Freq: Four times a day (QID) | INTRAMUSCULAR | Status: DC | PRN
  Administered 2020-05-26 – 2020-05-30 (×6): 12.5 mg via INTRAVENOUS
  Filled 2020-05-26 (×6): qty 1

## 2020-05-26 SURGICAL SUPPLY — 15 items

## 2020-05-26 NOTE — Anesthesia Postprocedure Evaluation (Signed)
Anesthesia Post Note  Patient: Tina Gomez  Procedure(s) Performed: ESOPHAGOGASTRODUODENOSCOPY (EGD) WITH PROPOFOL (N/A )     Patient location during evaluation: PACU Anesthesia Type: General Level of consciousness: awake and alert, oriented and patient cooperative Pain management: pain level controlled Vital Signs Assessment: post-procedure vital signs reviewed and stable Respiratory status: spontaneous breathing, nonlabored ventilation and respiratory function stable Cardiovascular status: blood pressure returned to baseline and stable Postop Assessment: no apparent nausea or vomiting Anesthetic complications: no   No complications documented.  Last Vitals:  Vitals:   05/26/20 1240 05/26/20 1302  BP: (!) 199/74 (!) 192/86  Pulse: 76 76  Resp: 18 18  Temp:    SpO2: 96% 97%    Last Pain:  Vitals:   05/26/20 1302  TempSrc: Oral  PainSc:                  [name]

## 2020-05-26 NOTE — Op Note (Signed)
[facility] Patient Name: Tina Gomez Procedure Date : 05/26/2020 MRN: [id number] Attending MD: Docia Chuck. Henrene Pastor , MD Date of Birth: 09-12-1955 CSN: [id number] Age: 64 Admit Type: Inpatient Procedure:                Upper GI endoscopy Indications:              Nausea with vomiting Providers:                Docia Chuck. Henrene Pastor, MD, Doristine Johns, RN, William Dalton, Technician Referring MD:             [facility] Medicines:                Monitored Anesthesia Care Complications:            No immediate complications. Estimated Blood Loss:     Estimated blood loss: none. Procedure:                Pre-Anesthesia Assessment:                           - Prior to the procedure, a History and Physical                            was performed, and patient medications and                            allergies were reviewed. The patient's tolerance of                            previous anesthesia was also reviewed. The risks                            and benefits of the procedure and the sedation                            options and risks were discussed with the patient.                            All questions were answered, and informed consent                            was obtained. Prior Anticoagulants: The patient has                            taken no previous anticoagulant or antiplatelet                            agents. ASA Grade Assessment: II - A patient with                            mild systemic disease. After reviewing the risks  and benefits, the patient was deemed in                            satisfactory condition to undergo the procedure.                           After obtaining informed consent, the endoscope was                            passed under direct vision. Throughout the                            procedure, the patient's blood pressure, pulse, and                            oxygen  saturations were monitored continuously. The                            GIF-H190 ([id number]) Olympus gastroscope was                            introduced through the mouth, and advanced to the                            second part of duodenum. The upper GI endoscopy was                            accomplished without difficulty. The patient                            tolerated the procedure well. Scope In: Scope Out: Findings:      The esophagus was normal save a benign large caliber distal esophageal       ring.      The stomach was normal, save a small hiatal hernia.      The examined duodenum was normal.      The cardia and gastric fundus were normal on retroflexion. Impression:               1. Incidental benign large caliber distal                            esophageal ring                           2. Otherwise normal EGD                           3. No cause for nausea and vomiting found on upper                            endoscopy Recommendation:           1. The patient will return to the general medical                            ward for ongoing  general medical care                           2. GI inpatient team will continue to follow as                            needed.                           The results of the endoscopic examination and plans                            were discussed with the patient's daughter [name]                            [phone]. Procedure Code(s):        --- Professional ---                           [phone], Esophagogastroduodenoscopy, flexible,                            transoral; diagnostic, including collection of                            specimen(s) by brushing or washing, when performed                            (separate procedure) Diagnosis Code(s):        --- Professional ---                           R11.2, Nausea with vomiting, unspecified CPT copyright 2019 American Medical Association. All rights reserved. The codes  documented in this report are preliminary and upon coder review may  be revised to meet current compliance requirements. Docia Chuck. Henrene Pastor, MD 05/26/2020 12:22:13 PM This report has been signed electronically. Number of Addenda: 0

## 2020-05-26 NOTE — Progress Notes (Signed)
Patient recently returned from Endo. Complains of nausea and threw up recent medications that were given; IV zofran given.

## 2020-05-26 NOTE — Progress Notes (Signed)
PROGRESS NOTE    Tina Gomez  KDX:[id number] DOB: Sep 28, 1955 DOA: 05/24/2020 PCP: Charlott Rakes, MD  Brief Narrative: 64 year old female with history of COPD, tobacco abuse, CVA with right hemiplegia, type 2 diabetes mellitus, hypertension sent to the ED by PCP due to acute kidney injury along with ongoing nausea, vomiting, very poor oral intake for 3 to 6 months.  Vomiting worse in the morning, also reports diarrhea for the last several weeks, denies any abdominal pain.  Also history of 40 pound weight loss -Work-up in the ED noted acute kidney injury of creatinine greater than 4, severe hypokalemia, hypocalcemia, hypomagnesemia, albumin of 1.9, normal LFTs, hemoglobin of 9.8. -CT abdomen pelvis without contrast noted sigmoid diverticulitis, emphysema and hepatomegaly.  Assessment & Plan:   Nausea vomiting and diarrhea 40 pound weight loss -CT abdomen pelvis without contrast raised concern for noncomplicated sigmoid diverticulitis, however exam not consistent with this -On empiric IV ceftriaxone and Flagyl day 2 -Continue IV fluids today -Gastroenterology following -Needs a colonoscopy, being planned as outpatient  -EGD today  Acute kidney injury on chronic kidney disease stage IIIa -Baseline creatinine was 1.79 in 11/2018, this was greater than 4 on admission  -Suspect has ATN now , from prerenal etiology with ongoing GI losses, worsened by HCTZ and lisinopril use -ACE inhibitor on hold, CT without hydronephrosis -Continue IV fluids for another day, creatinine appears to have plateaued at 3.3 today -Monitor urine output, BMP in a.m.  Hepatomegaly Hypoalbuminemia -Wonder if she has NASH, denies EtOH abuse -Also check hep C  Severe hypokalemia Hypomagnesemia -Due to GI losses, replace -BMP in a.m.  Iron deficiency anemia -Continue GI work-up as noted above, plan for outpatient colonoscopy  History of CVA  Tobacco abuse COPD  Diabetes mellitus -Hold glipizide, CBGs  are stable, continue sliding scale insulin -Hemoglobin A1c is 7.9  Hypertension -Continue Coreg, hold lisinopril  Severe protein calorie malnutrition -40 pound weight loss, albumin of 1.9 -RD consult  DVT prophylaxis: Add Lovenox after endoscopy Code Status: Full code Family Communication: Daughter at bedside 9/16 Disposition Plan:  Status is: Inpatient  Remains inpatient appropriate because:Inpatient level of care appropriate due to severity of illness   Dispo: The patient is from: Home              Anticipated d/c is to: Home              Anticipated d/c date is: > 3 days              Patient currently is not medically stable to d/c.  Consultants:   Gastroenterology   Procedures:   Antimicrobials:    Subjective: -Had some nausea and vomiting yesterday, none overnight, awaiting endoscopy today  Objective: Vitals:   05/25/20 1931 05/26/20 0030 05/26/20 0451 05/26/20 1116  BP: (!) 165/77 (!) 162/75 (!) 170/79 (!) 207/75  Pulse: 66 68 70   Resp: 20 19 19 15   Temp: 97.8 F (36.6 C) 97.8 F (36.6 C) 97.6 F (36.4 C)   TempSrc: Oral Oral Oral   SpO2: 97% 97% 93% 95%  Weight: 88 kg 88 kg    Height: 5\' 2"  (1.575 m)       Intake/Output Summary (Last 24 hours) at 05/26/2020 1211 Last data filed at 05/26/2020 0452 Gross per 24 hour  Intake 2720.83 ml  Output --  Net 2720.83 ml   Filed Weights   05/25/20 1931 05/26/20 0030  Weight: 88 kg 88 kg    Examination:  General exam: Pleasant  obese female, appears older than stated age, AAOx3, no distress HEENT: no JVD CVS: S1-S2, regular rate rhythm Lungs: Clear bilaterally Abdomen: Soft, nontender, bowel sounds present Extremities: No edema Skin: No rashes on exposed skin Psychiatry:  Mood & affect appropriate.     Data Reviewed:   CBC: Recent Labs  Lab 05/24/20 1326 05/24/20 1326 05/25/20 0107 05/25/20 0245 05/25/20 1053 05/26/20 0321 05/26/20 1121  WBC 16.5*  --   --  13.5*  --  13.0*  --    NEUTROABS  --   --   --  10.8*  --   --   --   HGB 10.4*   < > 10.9* 9.8* 9.5* 10.0* 10.2*  HCT 32.8*   < > 32.0* 31.5* 28.0* 31.6* 30.0*  MCV 86.8  --   --  90.3  --  88.0  --   PLT 440*  --   --  377  --  407*  --    < > = values in this interval not displayed.   Basic Metabolic Panel: Recent Labs  Lab 05/24/20 1326 05/24/20 1326 05/24/20 1400 05/25/20 0032 05/25/20 0107 05/25/20 0245 05/25/20 1042 05/25/20 1053 05/26/20 0321 05/26/20 1121  NA 140   < >  --  143   < > 141 142 143 143 145  K 2.5*   < >  --  2.3*   < > 2.4* 2.5* 2.5* 2.9* 3.5  CL 100   < >  --  107   < > 106 106 106 111 113*  CO2 22  --   --  24  --  21* 22  --  21*  --   GLUCOSE 280*   < >  --  176*   < > 205* 132* 128* 128* 123*  BUN 51*   < >  --  44*   < > 44* 41* 38* 34* 30*  CREATININE 4.20*   < >  --  3.56*   < > 3.47* 3.39* 3.60* 3.25* 3.30*  CALCIUM 6.7*  --   --  6.2*  --  6.4* 6.6*  --  6.5*  --   MG  --   --  1.4*  --   --  1.9  --   --   --   --   PHOS  --   --   --  5.6*  --  5.2*  --   --   --   --    < > = values in this interval not displayed.   GFR: Estimated Creatinine Clearance: 18 mL/min (A) (by C-G formula based on SCr of 3.3 mg/dL (H)). Liver Function Tests: Recent Labs  Lab 05/23/20 1656 05/24/20 1326 05/25/20 0245 05/26/20 0321  AST 4 11* 10* 12*  ALT 4 8 9 9   ALKPHOS 118 91 75 85  BILITOT <0.2 0.4 0.4 0.3  PROT 5.8* 6.1* 5.4* 5.6*  ALBUMIN 3.0* 2.2* 1.9* 1.9*   Recent Labs  Lab 05/24/20 1326  LIPASE 26   No results for input(s): AMMONIA in the last 168 hours. Coagulation Profile: No results for input(s): INR, PROTIME in the last 168 hours. Cardiac Enzymes: Recent Labs  Lab 05/25/20 0032  CKTOTAL 69   BNP (last 3 results) No results for input(s): PROBNP in the last 8760 hours. HbA1C: Recent Labs    05/25/20 0032 05/25/20 0245  HGBA1C 7.8* 7.9*   CBG: Recent Labs  Lab 05/25/20 1556 05/25/20 2039 05/26/20 0034 05/26/20 0443 05/26/20  0750  GLUCAP  143* 162* 117* 133* 118*   Lipid Profile: No results for input(s): CHOL, HDL, LDLCALC, TRIG, CHOLHDL, LDLDIRECT in the last 72 hours. Thyroid Function Tests: Recent Labs    05/25/20 0245  TSH 3.049   Anemia Panel: Recent Labs    05/25/20 0245  VITAMINB12 218  FOLATE 9.1  FERRITIN 147  TIBC 165*  IRON 22*  RETICCTPCT 1.7   Urine analysis:    Component Value Date/Time   COLORURINE YELLOW 05/24/2020 1324   APPEARANCEUR HAZY (A) 05/24/2020 1324   LABSPEC 1.012 05/24/2020 1324   PHURINE 6.0 05/24/2020 1324   GLUCOSEU 50 (A) 05/24/2020 1324   HGBUR NEGATIVE 05/24/2020 1324   BILIRUBINUR NEGATIVE 05/24/2020 1324   KETONESUR NEGATIVE 05/24/2020 1324   PROTEINUR >=300 (A) 05/24/2020 1324   UROBILINOGEN 1.0 10/22/2013 1626   NITRITE NEGATIVE 05/24/2020 1324   LEUKOCYTESUR NEGATIVE 05/24/2020 1324   Sepsis Labs: @LABRCNTIP (procalcitonin:4,lacticidven:4)  ) Recent Results (from the past 240 hour(s))  SARS Coronavirus 2 by RT PCR (hospital order, performed in [place] hospital lab) Nasopharyngeal Nasopharyngeal Swab     Status: None   Collection Time: 05/24/20  5:00 PM   Specimen: Nasopharyngeal Swab  Result Value Ref Range Status   SARS Coronavirus 2 NEGATIVE NEGATIVE Final    Comment: (NOTE) SARS-CoV-2 target nucleic acids are NOT DETECTED.  The SARS-CoV-2 RNA is generally detectable in upper and lower respiratory specimens during the acute phase of infection. The lowest concentration of SARS-CoV-2 viral copies this assay can detect is 250 copies / mL. A negative result does not preclude SARS-CoV-2 infection and should not be used as the sole basis for treatment or other patient management decisions.  A negative result may occur with improper specimen collection / handling, submission of specimen other than nasopharyngeal swab, presence of viral mutation(s) within the areas targeted by this assay, and inadequate number of viral copies (<250 copies / mL). A negative  result must be combined with clinical observations, patient history, and epidemiological information.  Fact Sheet for Patients:   StrictlyIdeas.no  Fact Sheet for Healthcare Providers: BankingDealers.co.za  This test is not yet approved or  cleared by the [place] FDA and has been authorized for detection and/or diagnosis of SARS-CoV-2 by FDA under an Emergency Use Authorization (EUA).  This EUA will remain in effect (meaning this test can be used) for the duration of the COVID-19 declaration under Section 564(b)(1) of the Act, 21 U.S.C. section 360bbb-3(b)(1), unless the authorization is terminated or revoked sooner.  Performed at [facility], [place] [address], [place], [place] [address]   Gastrointestinal Panel by PCR , Stool     Status: None   Collection Time: 05/25/20 12:32 AM   Specimen: Stool  Result Value Ref Range Status   Campylobacter species NOT DETECTED NOT DETECTED Final   Plesimonas shigelloides NOT DETECTED NOT DETECTED Final   Salmonella species NOT DETECTED NOT DETECTED Final   Yersinia enterocolitica NOT DETECTED NOT DETECTED Final   Vibrio species NOT DETECTED NOT DETECTED Final   Vibrio cholerae NOT DETECTED NOT DETECTED Final   Enteroaggregative E coli (EAEC) NOT DETECTED NOT DETECTED Final   Enteropathogenic E coli (EPEC) NOT DETECTED NOT DETECTED Final   Enterotoxigenic E coli (ETEC) NOT DETECTED NOT DETECTED Final   Shiga like toxin producing E coli (STEC) NOT DETECTED NOT DETECTED Final   Shigella/Enteroinvasive E coli (EIEC) NOT DETECTED NOT DETECTED Final   Cryptosporidium NOT DETECTED NOT DETECTED Final   Cyclospora  cayetanensis NOT DETECTED NOT DETECTED Final   Entamoeba histolytica NOT DETECTED NOT DETECTED Final   Giardia lamblia NOT DETECTED NOT DETECTED Final   Adenovirus F40/41 NOT DETECTED NOT DETECTED Final   Astrovirus NOT DETECTED NOT DETECTED Final   Norovirus GI/GII NOT DETECTED  NOT DETECTED Final   Rotavirus A NOT DETECTED NOT DETECTED Final   Sapovirus (I, II, IV, and V) NOT DETECTED NOT DETECTED Final    Comment: Performed at [facility], [address] [place], [place] [address]  C Difficile Quick Screen w PCR reflex     Status: None   Collection Time: 05/25/20 12:32 AM   Specimen: Stool  Result Value Ref Range Status   C Diff antigen NEGATIVE NEGATIVE Final   C Diff toxin NEGATIVE NEGATIVE Final   C Diff interpretation No C. difficile detected.  Final    Comment: Performed at [facility], [place] [address], [place], [place] [address]         Radiology Studies: CT Abdomen Pelvis Wo Contrast  Result Date: 05/24/2020 CLINICAL DATA:  Nausea, vomiting, greater than 40 pound weight loss, acute renal injury. EXAM: CT ABDOMEN AND PELVIS WITHOUT CONTRAST TECHNIQUE: Multidetector CT imaging of the abdomen and pelvis was performed following the standard protocol without IV contrast. COMPARISON:  None. FINDINGS: Lower chest: Lingular linear atelectasis. Paraseptal cystic changes of bilateral lower lobes. At least mild 2 vessel coronary artery calcifications. Hepatobiliary: The liver is enlarged measuring up to 20 cm. No focal liver abnormality is seen. No gallstones, gallbladder wall thickening, or biliary dilatation. Pancreas: Unremarkable. No pancreatic ductal dilatation or surrounding inflammatory changes. Spleen: Normal in size without focal abnormality. Adrenals/Urinary Tract: No adrenal nodule bilaterally. Bilateral kidneys enhance symmetrically. No hydronephrosis. No hydroureter. The urinary bladder is unremarkable. Stomach/Bowel: Stomach is within normal limits. Appendix appears normal. No evidence of small bowel wall thickening, distention, or inflammatory changes. There is mid sigmoid wall thickening with associated perisigmoid fat stranding and suggestion of been inflated diverticula open (7:91, 3:67). Scattered colonic diverticulosis that is more  prominent within the sigmoid colon. The rectum is underdistended with suggestion of mid rectal wall thickening. No bowel obstruction. Vascular/Lymphatic: No abdominal aorta or iliac aneurysm. Moderate to severe atherosclerotic plaque of the aorta and its branches. No abdominal, pelvic, or inguinal lymphadenopathy. Reproductive: Uterus and bilateral adnexa are unremarkable. Other: No intraperitoneal free fluid. No intraperitoneal free gas. No organized fluid collection. Musculoskeletal: No abdominal wall hernia or abnormality No suspicious lytic or blastic osseous lesions. No acute displaced fracture. Multilevel degenerative changes of the spine. Degenerative changes of the right hip. IMPRESSION: 1. Uncomplicated sigmoid diverticulosis with acute diverticulitis. No associated bowel perforation or abscess formation. Post antibiotic treatment and after resolution of inflammatory changes, recommend colonoscopy to evaluate for underlying malignancy given patient's symptoms. 2. Other imaging findings of potential clinical significance: Paraseptal cystic changes of bilateral lower lobes which may represent emphysema versus pulmonary fibrosis. Hepatomegaly. Electronically Signed   By: Iven Finn M.D.   On: 05/24/2020 18:49   DG Chest 2 View  Result Date: 05/24/2020 CLINICAL DATA:  Hypertension, emesis, loss of appetite with nausea EXAM: CHEST - 2 VIEW COMPARISON:  CT 04/29/2009, radiograph 10/22/2013 FINDINGS: Chronically coarsened interstitial changes and subpleural reticular opacities compatible with the extensive paraseptal and centrilobular emphysematous changes seen on comparison CT. These findings are overall quite similar to the comparison radiograph in 2015 with some persistent mild hyperinflation and increased AP diameter of the chest. No focal consolidative opacity, convincing features  of edema, pneumothorax or visible effusion. The aorta is calcified. The remaining cardiomediastinal contours are  unremarkable. No acute osseous or soft tissue abnormality. Telemetry leads overlie the chest. IMPRESSION: 1. Chronic hyperinflation, coarsened interstitial changes and subpleural reticular opacities compatible with the extensive paraseptal and centrilobular emphysematous changes seen on comparison studies and not significantly changed since 2015. 2. No acute cardiopulmonary disease. 3.  Aortic Atherosclerosis (ICD10-I70.0). Electronically Signed   By: Lovena Le M.D.   On: 05/24/2020 18:05   CT Head Wo Contrast  Result Date: 05/24/2020 CLINICAL DATA:  Nausea and vomiting EXAM: CT HEAD WITHOUT CONTRAST TECHNIQUE: Contiguous axial images were obtained from the base of the skull through the vertex without intravenous contrast. COMPARISON:  MRI 10/23/2013 FINDINGS: Brain: No acute territorial infarction, hemorrhage or intracranial mass. The ventricles are nonenlarged. Minimal white matter hypodensity likely chronic small vessel ischemic change. Stable ventricle size. Vascular: No hyperdense vessels.  Carotid vascular calcification. Skull: Normal. Negative for fracture or focal lesion. Sinuses/Orbits: No acute finding. Other: None IMPRESSION: 1. No CT evidence for acute intracranial abnormality. 2. Minimal small vessel ischemic changes of the white matter. Electronically Signed   By: Donavan Foil M.D.   On: 05/24/2020 20:45   VAS [place] LOWER EXTREMITY VENOUS (DVT)  Result Date: 05/25/2020  Lower Venous DVTStudy Indications: Edema.  Comparison Study: no prior Performing Technologist: Abram Sander RVS  Examination Guidelines: A complete evaluation includes B-mode imaging, spectral Doppler, color Doppler, and power Doppler as needed of all accessible portions of each vessel. Bilateral testing is considered an integral part of a complete examination. Limited examinations for reoccurring indications may be performed as noted. The reflux portion of the exam is performed with the patient in reverse Trendelenburg.   +---------+---------------+---------+-----------+----------+--------------+ RIGHT    CompressibilityPhasicitySpontaneityPropertiesThrombus Aging +---------+---------------+---------+-----------+----------+--------------+ CFV      Full           Yes      Yes                                 +---------+---------------+---------+-----------+----------+--------------+ SFJ      Full                                                        +---------+---------------+---------+-----------+----------+--------------+ FV Prox  Full                                                        +---------+---------------+---------+-----------+----------+--------------+ FV Mid   Full                                                        +---------+---------------+---------+-----------+----------+--------------+ FV DistalFull                                                        +---------+---------------+---------+-----------+----------+--------------+  PFV      Full                                                        +---------+---------------+---------+-----------+----------+--------------+ POP      Full           Yes      Yes                                 +---------+---------------+---------+-----------+----------+--------------+ PTV      Full                                                        +---------+---------------+---------+-----------+----------+--------------+ PERO     Full                                                        +---------+---------------+---------+-----------+----------+--------------+   +---------+---------------+---------+-----------+----------+--------------+ LEFT     CompressibilityPhasicitySpontaneityPropertiesThrombus Aging +---------+---------------+---------+-----------+----------+--------------+ CFV      Full           Yes      Yes                                  +---------+---------------+---------+-----------+----------+--------------+ SFJ      Full                                                        +---------+---------------+---------+-----------+----------+--------------+ FV Prox  Full                                                        +---------+---------------+---------+-----------+----------+--------------+ FV Mid   Full                                                        +---------+---------------+---------+-----------+----------+--------------+ FV DistalFull                                                        +---------+---------------+---------+-----------+----------+--------------+ PFV      Full                                                        +---------+---------------+---------+-----------+----------+--------------+   POP      Full           Yes      Yes                                 +---------+---------------+---------+-----------+----------+--------------+ PTV      Full                                                        +---------+---------------+---------+-----------+----------+--------------+ PERO                                                  Not visualized +---------+---------------+---------+-----------+----------+--------------+     Summary: BILATERAL: - No evidence of deep vein thrombosis seen in the lower extremities, bilaterally. - No evidence of superficial venous thrombosis in the lower extremities, bilaterally. -   *See table(s) above for measurements and observations. Electronically signed by Monica Martinez MD on 05/25/2020 at 4:15:44 PM.    Final         Scheduled Meds: . [MAR Hold] amLODipine  10 mg Oral Daily  . [MAR Hold] atorvastatin  40 mg Oral QPM  . [MAR Hold] carvedilol  3.125 mg Oral BID WC  . [MAR Hold] DULoxetine  60 mg Oral Daily  . [MAR Hold] insulin aspart  0-9 Units Subcutaneous Q4H  . [MAR Hold] pantoprazole (PROTONIX) IV  40 mg  Intravenous Q12H  . [MAR Hold] sodium chloride flush  3 mL Intravenous Q12H   Continuous Infusions: . 0.9 % NaCl with KCl 40 mEq / L 100 mL/hr at 05/26/20 0655  . [MAR Hold] cefTRIAXone (ROCEPHIN)  IV 2 g (05/25/20 2033)  . [MAR Hold] metronidazole 500 mg (05/26/20 0452)  . [MAR Hold] potassium chloride 10 mEq (05/26/20 1020)     LOS: 2 days    Time spent: 36min  Domenic Polite, MD [facility]   05/26/2020, 12:11 PM

## 2020-05-26 NOTE — Interval H&P Note (Signed)
History and Physical Interval Note:  05/26/2020 11:51 AM  Tina Gomez  has presented today for surgery, with the diagnosis of Nonbloody, bilious nausea and vomiting for 3 months..  The various methods of treatment have been discussed with the patient and family. After consideration of risks, benefits and other options for treatment, the patient has consented to  Procedure(s): ESOPHAGOGASTRODUODENOSCOPY (EGD) WITH PROPOFOL (N/A) as a surgical intervention.  The patient's history has been reviewed, patient examined, no change in status, stable for surgery.  I have reviewed the patient's chart and labs.  Questions were answered to the patient's satisfaction.     Scarlette Shorts

## 2020-05-26 NOTE — Progress Notes (Signed)
Initial Nutrition Assessment  DOCUMENTATION CODES:   Obesity unspecified  INTERVENTION:   -Boost Breeze po TID, each supplement provides 250 kcal and 9 grams of protein -30 ml Prosource Plus BID, each supplement provides 100 kcals and 15 grams protien -MVI with minerals daily -RD will follow for diet advancement and adjust supplement regimen as appropriate  NUTRITION DIAGNOSIS:   Inadequate oral intake related to altered GI function, nausea, vomiting as evidenced by per patient/family report.  GOAL:   Patient will meet greater than or equal to 90% of their needs  MONITOR:   PO intake, Supplement acceptance, Diet advancement, Labs, Weight trends, Skin, I & O's  REASON FOR ASSESSMENT:   Malnutrition Screening Tool    ASSESSMENT:   Tina Gomez is a 64 y.o. female with medical history significant of HTn, hx of CVA with right side weakness, psoriasis, DM type II, GERD, Tobacco abuse. Presented with   6-3 m hx of N/v sometimes loose stools, decreased Po intake.  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    Pt down in endo suit at time of visit. RD unable to obtain further nutrition-related history or complete nutrition-focused physical exam at this time.   Per chart review, pt reports a 40 pound weight loss, however, no weight history to confirm this finding.   Pt just advanced to clear liquids; will add clear liquid friendly supplements to help maximize intake.   Medications reviewed and include 0.9% NaCl with KCl 40 mEq/L infusion @ 75 ml/hr.   Lab Results  Component Value Date   HGBA1C 7.9 (H) 05/25/2020   PTA DM medications are 10 mg glipizide BID.   Labs reviewed: CBGS: 118-133 (inpatient orders for glycemic control are 0-9 units insulin aspart every 4 hours).   Diet  Order:   Diet Order            Diet clear liquid Room service appropriate? Yes; Fluid consistency: Thin  Diet effective now                 EDUCATION NEEDS:   No education needs have been identified at this time  Skin:  Skin Assessment: Reviewed RN Assessment  Last BM:  05/26/20  Height:   Ht Readings from Last 1 Encounters:  05/25/20 5\' 2"  (1.575 m)    Weight:   Wt Readings from Last 1 Encounters:  05/26/20 88 kg    Ideal Body Weight:  50 kg  BMI:  Body mass index is 35.5 kg/m.  Estimated Nutritional Needs:   Kcal:  1550-1750  Protein:  85-100 grams  Fluid:  > 1.5 L    Loistine Chance, RD, LDN, [place] Registered Dietitian II Certified Diabetes Care and Education Specialist Please refer to [facility] for RD and/or RD on-call/weekend/after hours pager

## 2020-05-26 NOTE — Transfer of Care (Signed)
Immediate Anesthesia Transfer of Care Note  Patient: Teleah J Musial  Procedure(s) Performed: ESOPHAGOGASTRODUODENOSCOPY (EGD) WITH PROPOFOL (N/A )  Patient Location: Endoscopy Unit  Anesthesia Type:General  Level of Consciousness: awake, alert  and oriented  Airway & Oxygen Therapy: Patient Spontanous Breathing and Patient connected to face mask oxygen  Post-op Assessment: Report given to RN and Post -op Vital signs reviewed and stable  Post vital signs: Reviewed and stable  Last Vitals:  Vitals Value Taken Time  BP 212/69 05/26/20 1221  Temp    Pulse 74 05/26/20 1221  Resp 14 05/26/20 1221  SpO2 98 % 05/26/20 1221    Last Pain:  Vitals:   05/26/20 1116  TempSrc:   PainSc: 0-No pain         Complications: No complications documented.

## 2020-05-26 NOTE — Anesthesia Procedure Notes (Signed)
Procedure Name: Intubation Date/Time: 05/26/2020 12:00 PM Performed by: Candis Shine, CRNA Pre-anesthesia Checklist: Patient identified, Emergency Drugs available, Suction available and Patient being monitored Patient Re-evaluated:Patient Re-evaluated prior to induction Oxygen Delivery Method: Circle System Utilized Preoxygenation: Pre-oxygenation with 100% oxygen Induction Type: IV induction, Rapid sequence and Cricoid Pressure applied Laryngoscope Size: Mac and 3 Grade View: Grade I Tube type: Oral Tube size: 7.0 mm Number of attempts: 1 Airway Equipment and Method: Stylet Placement Confirmation: ETT inserted through vocal cords under direct vision,  positive ETCO2 and breath sounds checked- equal and bilateral Secured at: 22 cm Tube secured with: Tape Dental Injury: Teeth and Oropharynx as per pre-operative assessment

## 2020-05-27 LAB — CBC
HCT: 33.6 % — ABNORMAL LOW (ref 36.0–46.0)
Hemoglobin: 10.2 g/dL — ABNORMAL LOW (ref 12.0–15.0)
MCH: 27.4 pg (ref 26.0–34.0)
MCHC: 30.4 g/dL (ref 30.0–36.0)
MCV: 90.3 fL (ref 80.0–100.0)
Platelets: 408 10*3/uL — ABNORMAL HIGH (ref 150–400)
RBC: 3.72 MIL/uL — ABNORMAL LOW (ref 3.87–5.11)
RDW: 13.9 % (ref 11.5–15.5)
WBC: 12.5 10*3/uL — ABNORMAL HIGH (ref 4.0–10.5)
nRBC: 0 % (ref 0.0–0.2)

## 2020-05-27 LAB — HEPATITIS C ANTIBODY: HCV Ab: NONREACTIVE

## 2020-05-27 LAB — COMPREHENSIVE METABOLIC PANEL
ALT: 10 U/L (ref 0–44)
AST: 11 U/L — ABNORMAL LOW (ref 15–41)
Albumin: 2 g/dL — ABNORMAL LOW (ref 3.5–5.0)
Alkaline Phosphatase: 79 U/L (ref 38–126)
Anion gap: 11 (ref 5–15)
BUN: 25 mg/dL — ABNORMAL HIGH (ref 8–23)
CO2: 17 mmol/L — ABNORMAL LOW (ref 22–32)
Calcium: 7.4 mg/dL — ABNORMAL LOW (ref 8.9–10.3)
Chloride: 114 mmol/L — ABNORMAL HIGH (ref 98–111)
Creatinine, Ser: 3.13 mg/dL — ABNORMAL HIGH (ref 0.44–1.00)
GFR calc Af Amer: 17 mL/min — ABNORMAL LOW (ref >60)
GFR calc non Af Amer: 15 mL/min — ABNORMAL LOW (ref >60)
Glucose, Bld: 133 mg/dL — ABNORMAL HIGH (ref 70–99)
Potassium: 3.5 mmol/L (ref 3.5–5.1)
Sodium: 142 mmol/L (ref 135–145)
Total Bilirubin: 0.6 mg/dL (ref 0.3–1.2)
Total Protein: 5.7 g/dL — ABNORMAL LOW (ref 6.5–8.1)

## 2020-05-27 LAB — GLUCOSE, CAPILLARY
Glucose-Capillary: 100 mg/dL — ABNORMAL HIGH (ref 70–99)
Glucose-Capillary: 119 mg/dL — ABNORMAL HIGH (ref 70–99)
Glucose-Capillary: 122 mg/dL — ABNORMAL HIGH (ref 70–99)
Glucose-Capillary: 135 mg/dL — ABNORMAL HIGH (ref 70–99)
Glucose-Capillary: 139 mg/dL — ABNORMAL HIGH (ref 70–99)
Glucose-Capillary: 168 mg/dL — ABNORMAL HIGH (ref 70–99)

## 2020-05-27 MED ORDER — METOCLOPRAMIDE HCL 5 MG/ML IJ SOLN
5.0000 mg | Freq: Three times a day (TID) | INTRAMUSCULAR | Status: DC
  Administered 2020-05-27 – 2020-05-28 (×5): 5 mg via INTRAVENOUS
  Filled 2020-05-27 (×5): qty 2

## 2020-05-27 MED ORDER — ONDANSETRON HCL 4 MG/2ML IJ SOLN
4.0000 mg | Freq: Three times a day (TID) | INTRAMUSCULAR | Status: DC
  Administered 2020-05-27 – 2020-05-28 (×3): 4 mg via INTRAVENOUS
  Filled 2020-05-27 (×3): qty 2

## 2020-05-27 NOTE — Plan of Care (Signed)
  Problem: Activity: Goal: Risk for activity intolerance will decrease Outcome: Progressing   Problem: Nutrition: Goal: Adequate nutrition will be maintained Outcome: Progressing   

## 2020-05-27 NOTE — Progress Notes (Signed)
PROGRESS NOTE    Tina Gomez  QIW:[id number] DOB: Feb 20, 1956 DOA: 05/24/2020 PCP: Charlott Rakes, MD  Brief Narrative: 64 year old female with history of COPD, tobacco abuse, CVA with right hemiplegia, type 2 diabetes mellitus, hypertension sent to the ED by PCP due to acute kidney injury along with ongoing nausea, vomiting, very poor oral intake for 3 to 6 months.  Vomiting worse in the morning, also reports diarrhea for the last several weeks, denies any abdominal pain.  Also history of 40 pound weight loss -Work-up in the ED noted acute kidney injury of creatinine greater than 4, severe hypokalemia, hypocalcemia, hypomagnesemia, albumin of 1.9, normal LFTs, hemoglobin of 9.8. -CT abdomen pelvis without contrast noted sigmoid diverticulitis, emphysema and hepatomegaly.  Assessment & Plan:   Chronic Nausea vomiting  recent diarrhea 40 pound weight loss -Symptoms found ongoing for about 3 months, CT abdomen pelvis without contrast raised concern for noncomplicated sigmoid diverticulitis, however exam not consistent with this -On empiric IV ceftriaxone and Flagyl day 3, due to worsening nausea will stop Flagyl today -Continue IV fluids, change Zofran to scheduled and add Phenergan as needed -Endoscopy yesterday was unremarkable except for distal esophageal ring -Gastroenterology following -Check barium esophagram to rule out motility issues  Acute kidney injury on chronic kidney disease stage IIIa -Baseline creatinine was 1.79 in 11/2018, this was greater than 4 on admission  -Suspect has ATN now , from prerenal etiology with ongoing GI losses, worsened by HCTZ and lisinopril use -ACE inhibitor on hold, CT without hydronephrosis -Continue IV fluids today, creatinine finally starting to improve -BMP in a.m., monitor urine output  Hepatomegaly Hypoalbuminemia -Wonder if she has NASH, denies EtOH abuse -Hep C antibody negative  Severe hypokalemia Hypomagnesemia -Due to GI losses,  replaced -BMP in a.m.  Iron deficiency anemia -Continue GI work-up as noted above, plan for outpatient colonoscopy  History of CVA  Tobacco abuse COPD  Diabetes mellitus -Hold glipizide, CBGs are stable, continue sliding scale insulin -Hemoglobin A1c is 7.9  Hypertension -Continue Coreg, hold lisinopril  Severe protein calorie malnutrition -40 pound weight loss, albumin of 1.9 -RD consult  DVT prophylaxis: Lovenox after endoscopy Code Status: Full code Family Communication: Daughter at bedside 9/16 Disposition Plan:  Status is: Inpatient  Remains inpatient appropriate because:Inpatient level of care appropriate due to severity of illness   Dispo: The patient is from: Home              Anticipated d/c is to: Home              Anticipated d/c date is: > 3 days              Patient currently is not medically stable to d/c.  Consultants:   Gastroenterology   Procedures:   Antimicrobials:    Subjective: -Threw up again multiple times last night, feels nauseated this morning, denies any abdominal pain  Objective: Vitals:   05/26/20 2019 05/27/20 0455 05/27/20 0502 05/27/20 0856  BP: (!) 164/77 (!) 182/64  (!) 187/73  Pulse: 70 72  72  Resp: 19 19  18   Temp: 97.6 F (36.4 C) 98 F (36.7 C)    TempSrc: Oral Oral    SpO2: 93% 93%  96%  Weight:   90.2 kg   Height:        Intake/Output Summary (Last 24 hours) at 05/27/2020 1129 Last data filed at 05/27/2020 0600 Gross per 24 hour  Intake 1987.16 ml  Output --  Net 1987.16 ml  Filed Weights   05/25/20 1931 05/26/20 0030 05/27/20 0502  Weight: 88 kg 88 kg 90.2 kg    Examination:  General exam: Pleasant obese female, appears much older than stated age, AAOx3, no distress HEENT: No JVD CVS: S1-S2, regular rate rhythm Lungs: Poor air movement bilaterally Abdomen: Soft, nontender, mildly distended, bowel sounds present Extremities: No edema Skin: No rashes on exposed skin Psychiatry:  Mood & affect  appropriate.     Data Reviewed:   CBC: Recent Labs  Lab 05/24/20 1326 05/25/20 0107 05/25/20 0245 05/25/20 1053 05/26/20 0321 05/26/20 1121 05/27/20 0615  WBC 16.5*  --  13.5*  --  13.0*  --  12.5*  NEUTROABS  --   --  10.8*  --   --   --   --   HGB 10.4*   < > 9.8* 9.5* 10.0* 10.2* 10.2*  HCT 32.8*   < > 31.5* 28.0* 31.6* 30.0* 33.6*  MCV 86.8  --  90.3  --  88.0  --  90.3  PLT 440*  --  377  --  407*  --  408*   < > = values in this interval not displayed.   Basic Metabolic Panel: Recent Labs  Lab 05/24/20 1326 05/24/20 1400 05/25/20 0032 05/25/20 0107 05/25/20 0245 05/25/20 0245 05/25/20 1042 05/25/20 1053 05/26/20 0321 05/26/20 1121 05/27/20 0615  NA   < >  --  143   < > 141   < > 142 143 143 145 142  K   < >  --  2.3*   < > 2.4*   < > 2.5* 2.5* 2.9* 3.5 3.5  CL   < >  --  107   < > 106   < > 106 106 111 113* 114*  CO2   < >  --  24  --  21*  --  22  --  21*  --  17*  GLUCOSE   < >  --  176*   < > 205*   < > 132* 128* 128* 123* 133*  BUN   < >  --  44*   < > 44*   < > 41* 38* 34* 30* 25*  CREATININE   < >  --  3.56*   < > 3.47*   < > 3.39* 3.60* 3.25* 3.30* 3.13*  CALCIUM   < >  --  6.2*  --  6.4*  --  6.6*  --  6.5*  --  7.4*  MG  --  1.4*  --   --  1.9  --   --   --   --   --   --   PHOS  --   --  5.6*  --  5.2*  --   --   --   --   --   --    < > = values in this interval not displayed.   GFR: Estimated Creatinine Clearance: 19.2 mL/min (A) (by C-G formula based on SCr of 3.13 mg/dL (H)). Liver Function Tests: Recent Labs  Lab 05/23/20 1656 05/24/20 1326 05/25/20 0245 05/26/20 0321 05/27/20 0615  AST 4 11* 10* 12* 11*  ALT 4 8 9 9 10   ALKPHOS 118 91 75 85 79  BILITOT <0.2 0.4 0.4 0.3 0.6  PROT 5.8* 6.1* 5.4* 5.6* 5.7*  ALBUMIN 3.0* 2.2* 1.9* 1.9* 2.0*   Recent Labs  Lab 05/24/20 1326  LIPASE 26   No results for input(s): AMMONIA in the  last 168 hours. Coagulation Profile: No results for input(s): INR, PROTIME in the last 168  hours. Cardiac Enzymes: Recent Labs  Lab 05/25/20 0032  CKTOTAL 69   BNP (last 3 results) No results for input(s): PROBNP in the last 8760 hours. HbA1C: Recent Labs    05/25/20 0032 05/25/20 0245  HGBA1C 7.8* 7.9*   CBG: Recent Labs  Lab 05/26/20 2016 05/26/20 2104 05/27/20 0040 05/27/20 0457 05/27/20 0826  GLUCAP 162* 159* 100* 135* 122*   Lipid Profile: No results for input(s): CHOL, HDL, LDLCALC, TRIG, CHOLHDL, LDLDIRECT in the last 72 hours. Thyroid Function Tests: Recent Labs    05/25/20 0245  TSH 3.049   Anemia Panel: Recent Labs    05/25/20 0245  VITAMINB12 218  FOLATE 9.1  FERRITIN 147  TIBC 165*  IRON 22*  RETICCTPCT 1.7   Urine analysis:    Component Value Date/Time   COLORURINE YELLOW 05/24/2020 1324   APPEARANCEUR HAZY (A) 05/24/2020 1324   LABSPEC 1.012 05/24/2020 1324   PHURINE 6.0 05/24/2020 1324   GLUCOSEU 50 (A) 05/24/2020 1324   HGBUR NEGATIVE 05/24/2020 1324   BILIRUBINUR NEGATIVE 05/24/2020 1324   KETONESUR NEGATIVE 05/24/2020 1324   PROTEINUR >=300 (A) 05/24/2020 1324   UROBILINOGEN 1.0 10/22/2013 1626   NITRITE NEGATIVE 05/24/2020 1324   LEUKOCYTESUR NEGATIVE 05/24/2020 1324   Sepsis Labs: @LABRCNTIP (procalcitonin:4,lacticidven:4)  ) Recent Results (from the past 240 hour(s))  SARS Coronavirus 2 by RT PCR (hospital order, performed in [facility] hospital lab) Nasopharyngeal Nasopharyngeal Swab     Status: None   Collection Time: 05/24/20  5:00 PM   Specimen: Nasopharyngeal Swab  Result Value Ref Range Status   SARS Coronavirus 2 NEGATIVE NEGATIVE Final    Comment: (NOTE) SARS-CoV-2 target nucleic acids are NOT DETECTED.  The SARS-CoV-2 RNA is generally detectable in upper and lower respiratory specimens during the acute phase of infection. The lowest concentration of SARS-CoV-2 viral copies this assay can detect is 250 copies / mL. A negative result does not preclude SARS-CoV-2 infection and should not be used as the  sole basis for treatment or other patient management decisions.  A negative result may occur with improper specimen collection / handling, submission of specimen other than nasopharyngeal swab, presence of viral mutation(s) within the areas targeted by this assay, and inadequate number of viral copies (<250 copies / mL). A negative result must be combined with clinical observations, patient history, and epidemiological information.  Fact Sheet for Patients:   StrictlyIdeas.no  Fact Sheet for Healthcare Providers: BankingDealers.co.za  This test is not yet approved or  cleared by the [place] FDA and has been authorized for detection and/or diagnosis of SARS-CoV-2 by FDA under an Emergency Use Authorization (EUA).  This EUA will remain in effect (meaning this test can be used) for the duration of the COVID-19 declaration under Section 564(b)(1) of the Act, 21 U.S.C. section 360bbb-3(b)(1), unless the authorization is terminated or revoked sooner.  Performed at [facility], [place] [address] [place], [place] [address]   Gastrointestinal Panel by PCR , Stool     Status: None   Collection Time: 05/25/20 12:32 AM   Specimen: Stool  Result Value Ref Range Status   Campylobacter species NOT DETECTED NOT DETECTED Final   Plesimonas shigelloides NOT DETECTED NOT DETECTED Final   Salmonella species NOT DETECTED NOT DETECTED Final   Yersinia enterocolitica NOT DETECTED NOT DETECTED Final   Vibrio species NOT DETECTED NOT DETECTED Final   Vibrio cholerae NOT  DETECTED NOT DETECTED Final   Enteroaggregative E coli (EAEC) NOT DETECTED NOT DETECTED Final   Enteropathogenic E coli (EPEC) NOT DETECTED NOT DETECTED Final   Enterotoxigenic E coli (ETEC) NOT DETECTED NOT DETECTED Final   Shiga like toxin producing E coli (STEC) NOT DETECTED NOT DETECTED Final   Shigella/Enteroinvasive E coli (EIEC) NOT DETECTED NOT DETECTED Final    Cryptosporidium NOT DETECTED NOT DETECTED Final   Cyclospora cayetanensis NOT DETECTED NOT DETECTED Final   Entamoeba histolytica NOT DETECTED NOT DETECTED Final   Giardia lamblia NOT DETECTED NOT DETECTED Final   Adenovirus F40/41 NOT DETECTED NOT DETECTED Final   Astrovirus NOT DETECTED NOT DETECTED Final   Norovirus GI/GII NOT DETECTED NOT DETECTED Final   Rotavirus A NOT DETECTED NOT DETECTED Final   Sapovirus (I, II, IV, and V) NOT DETECTED NOT DETECTED Final    Comment: Performed at [facility], [address] [place], [place] [address]  C Difficile Quick Screen w PCR reflex     Status: None   Collection Time: 05/25/20 12:32 AM   Specimen: Stool  Result Value Ref Range Status   C Diff antigen NEGATIVE NEGATIVE Final   C Diff toxin NEGATIVE NEGATIVE Final   C Diff interpretation No C. difficile detected.  Final    Comment: Performed at [facility], [place] [address] [place], [place] [address]         Radiology Studies: VAS [place] LOWER EXTREMITY VENOUS (DVT)  Result Date: 05/25/2020  Lower Venous DVTStudy Indications: Edema.  Comparison Study: no prior Performing Technologist: Abram Sander RVS  Examination Guidelines: A complete evaluation includes B-mode imaging, spectral Doppler, color Doppler, and power Doppler as needed of all accessible portions of each vessel. Bilateral testing is considered an integral part of a complete examination. Limited examinations for reoccurring indications may be performed as noted. The reflux portion of the exam is performed with the patient in reverse Trendelenburg.  +---------+---------------+---------+-----------+----------+--------------+ RIGHT    CompressibilityPhasicitySpontaneityPropertiesThrombus Aging +---------+---------------+---------+-----------+----------+--------------+ CFV      Full           Yes      Yes                                  +---------+---------------+---------+-----------+----------+--------------+ SFJ      Full                                                        +---------+---------------+---------+-----------+----------+--------------+ FV Prox  Full                                                        +---------+---------------+---------+-----------+----------+--------------+ FV Mid   Full                                                        +---------+---------------+---------+-----------+----------+--------------+ FV DistalFull                                                        +---------+---------------+---------+-----------+----------+--------------+  PFV      Full                                                        +---------+---------------+---------+-----------+----------+--------------+ POP      Full           Yes      Yes                                 +---------+---------------+---------+-----------+----------+--------------+ PTV      Full                                                        +---------+---------------+---------+-----------+----------+--------------+ PERO     Full                                                        +---------+---------------+---------+-----------+----------+--------------+   +---------+---------------+---------+-----------+----------+--------------+ LEFT     CompressibilityPhasicitySpontaneityPropertiesThrombus Aging +---------+---------------+---------+-----------+----------+--------------+ CFV      Full           Yes      Yes                                 +---------+---------------+---------+-----------+----------+--------------+ SFJ      Full                                                        +---------+---------------+---------+-----------+----------+--------------+ FV Prox  Full                                                         +---------+---------------+---------+-----------+----------+--------------+ FV Mid   Full                                                        +---------+---------------+---------+-----------+----------+--------------+ FV DistalFull                                                        +---------+---------------+---------+-----------+----------+--------------+ PFV      Full                                                        +---------+---------------+---------+-----------+----------+--------------+  POP      Full           Yes      Yes                                 +---------+---------------+---------+-----------+----------+--------------+ PTV      Full                                                        +---------+---------------+---------+-----------+----------+--------------+ PERO                                                  Not visualized +---------+---------------+---------+-----------+----------+--------------+     Summary: BILATERAL: - No evidence of deep vein thrombosis seen in the lower extremities, bilaterally. - No evidence of superficial venous thrombosis in the lower extremities, bilaterally. -   *See table(s) above for measurements and observations. Electronically signed by Monica Martinez MD on 05/25/2020 at 4:15:44 PM.    Final         Scheduled Meds: . (feeding supplement) PROSource Plus  30 mL Oral BID BM  . amLODipine  10 mg Oral Daily  . atorvastatin  40 mg Oral QPM  . carvedilol  3.125 mg Oral BID WC  . DULoxetine  60 mg Oral Daily  . enoxaparin (LOVENOX) injection  30 mg Subcutaneous Q24H  . feeding supplement  1 Container Oral TID BM  . insulin aspart  0-9 Units Subcutaneous Q4H  . metoCLOPramide (REGLAN) injection  5 mg Intravenous TID AC & HS  . multivitamin with minerals  1 tablet Oral Daily  . ondansetron (ZOFRAN) IV  4 mg Intravenous TID AC  . pantoprazole (PROTONIX) IV  40 mg Intravenous Q12H  . sodium chloride  flush  3 mL Intravenous Q12H   Continuous Infusions: . 0.9 % NaCl with KCl 40 mEq / L 75 mL/hr at 05/26/20 2245  . cefTRIAXone (ROCEPHIN)  IV 2 g (05/26/20 2022)     LOS: 3 days    Time spent: 70min  Domenic Polite, MD [facility]   05/27/2020, 11:29 AM

## 2020-05-27 NOTE — Progress Notes (Addendum)
Progress Note  Chief Complaint:    Nausea / vomiting / loose stools    Attending physician's note   I have taken an interval history, reviewed the chart and examined the patient. I agree with the Advanced Practitioner's note, impression and recommendations.   Persistent nausea, decreased appetite and diarrhea  H.pylori positive  GI pathogen panel negative for C.diff or any other infectious etiology  Unclear etiology, may be multifactorial IBS + diabetes with poor glycemic control + positive H.pylori + ?diverticulitis  She is unlikely to tolerate antibiotic regimen for H.pylori treatment at this point  Trial of low dose Reglan  Small frequent meals  Avoid Soda or drinks with high sugar content  Will schedule for outpatient follow up and colonoscopy in 4-6 weeks    I have spent 35 minutes of patient care (this includes precharting, chart review, review of results, face-to-face time used for counseling as well as treatment plan and follow-up. The patient was provided an opportunity to ask questions and all were answered. The patient agreed with the plan and demonstrated an understanding of the instructions.  Damaris Hippo , MD [phone]      ASSESSMENT / PLAN:    # Chronic nausea, vomiting ( 3 months) / loose stools over the last month / significant weight loss --Etiology not clear --Except for a distal esophageal ring her EGD was normal.   --Complains of ongoing nausea / heaving this am but looking forward to trying soft diet (doesn't vomit with every meal) --Patient says she has been getting Zofran and Reglan but I don't see Reglan on MAR. Continue scheduled Zofran, will add Reglan 5mg  ( renal failure) IV ac and hs.  --She has been having some loose stools which Reglan may worsen. C-diff and GI path panel negative. Further evaluation of loose stools at time of outpatient colonoscopy. --Continue PPI  # H.pylori infection without PUD or gastritis on EGD.  --I  don't think with N/V that she can tolerate oral antibiotics.   #  ? Diverticulitis / Leukocytosis.   --WBC improving 13 >> 12.5 --Clinically she doesn't appear to have diverticulitis, (hasn't been having any significant abdominal pain)  --Given weight loss and CT scan findings she will need a colonoscopy in 4-6 weeks.   # AKI on CKD     SUBJECTIVE:   Nausea and heaving this am. Still having loose stools    OBJECTIVE:    Scheduled inpatient medications:  . (feeding supplement) PROSource Plus  30 mL Oral BID BM  . amLODipine  10 mg Oral Daily  . atorvastatin  40 mg Oral QPM  . carvedilol  3.125 mg Oral BID WC  . DULoxetine  60 mg Oral Daily  . enoxaparin (LOVENOX) injection  30 mg Subcutaneous Q24H  . feeding supplement  1 Container Oral TID BM  . insulin aspart  0-9 Units Subcutaneous Q4H  . multivitamin with minerals  1 tablet Oral Daily  . ondansetron (ZOFRAN) IV  4 mg Intravenous TID AC  . pantoprazole (PROTONIX) IV  40 mg Intravenous Q12H  . sodium chloride flush  3 mL Intravenous Q12H   Continuous inpatient infusions:  . 0.9 % NaCl with KCl 40 mEq / L 75 mL/hr at 05/26/20 2245  . cefTRIAXone (ROCEPHIN)  IV 2 g (05/26/20 2022)   PRN inpatient medications: acetaminophen **OR** acetaminophen, hydrALAZINE, HYDROmorphone (DILAUDID) injection, loperamide, promethazine  Vital signs in last 24 hours: Temp:  [97.4 F (36.3 C)-98 F (36.7 C)] 98 F (  36.7 C) (09/18 0455) Pulse Rate:  [70-76] 72 (09/18 0856) Resp:  [14-19] 18 (09/18 0856) BP: (164-212)/(64-86) 187/73 (09/18 0856) SpO2:  [93 %-98 %] 96 % (09/18 0856) Weight:  [90.2 kg] 90.2 kg (09/18 0502) Last BM Date: 05/26/20  Intake/Output Summary (Last 24 hours) at 05/27/2020 0908 Last data filed at 05/27/2020 0600 Gross per 24 hour  Intake 1987.16 ml  Output --  Net 1987.16 ml     Physical Exam:  . General: Alert female in NAD . Heart:  Regular rate and rhythm.  . Pulmonary: Normal respiratory  effort . Abdomen: Soft, nondistended, Nontender. Normal bowel sounds. No masses felt. . Neurologic: Alert and oriented . Psych:  Cooperative.   Filed Weights   05/25/20 1931 05/26/20 0030 05/27/20 0502  Weight: 88 kg 88 kg 90.2 kg    Intake/Output from previous day: 09/17 0701 - 09/18 0700 In: 1987.2 [P.O.:480; I.V.:1207.2; IV Piggyback:300] Out: -  Intake/Output this shift: No intake/output data recorded.    Lab Results: Recent Labs    05/25/20 0245 05/25/20 1053 05/26/20 0321 05/26/20 1121 05/27/20 0615  WBC 13.5*  --  13.0*  --  12.5*  HGB 9.8*   < > 10.0* 10.2* 10.2*  HCT 31.5*   < > 31.6* 30.0* 33.6*  PLT 377  --  407*  --  408*   < > = values in this interval not displayed.   BMET Recent Labs    05/25/20 1042 05/25/20 1053 05/26/20 0321 05/26/20 1121 05/27/20 0615  NA 142   < > 143 145 142  K 2.5*   < > 2.9* 3.5 3.5  CL 106   < > 111 113* 114*  CO2 22  --  21*  --  17*  GLUCOSE 132*   < > 128* 123* 133*  BUN 41*   < > 34* 30* 25*  CREATININE 3.39*   < > 3.25* 3.30* 3.13*  CALCIUM 6.6*  --  6.5*  --  7.4*   < > = values in this interval not displayed.   LFT Recent Labs    05/27/20 0615  PROT 5.7*  ALBUMIN 2.0*  AST 11*  ALT 10  ALKPHOS 79  BILITOT 0.6   PT/INR No results for input(s): LABPROT, INR in the last 72 hours. Hepatitis Panel No results for input(s): HEPBSAG, HCVAB, HEPAIGM, HEPBIGM in the last 72 hours.  VAS [place] LOWER EXTREMITY VENOUS (DVT)  Result Date: 05/25/2020  Lower Venous DVTStudy Indications: Edema.  Comparison Study: no prior Performing Technologist: Abram Sander RVS  Examination Guidelines: A complete evaluation includes B-mode imaging, spectral Doppler, color Doppler, and power Doppler as needed of all accessible portions of each vessel. Bilateral testing is considered an integral part of a complete examination. Limited examinations for reoccurring indications may be performed as noted. The reflux portion of the exam is  performed with the patient in reverse Trendelenburg.  +---------+---------------+---------+-----------+----------+--------------+ RIGHT    CompressibilityPhasicitySpontaneityPropertiesThrombus Aging +---------+---------------+---------+-----------+----------+--------------+ CFV      Full           Yes      Yes                                 +---------+---------------+---------+-----------+----------+--------------+ SFJ      Full                                                        +---------+---------------+---------+-----------+----------+--------------+  FV Prox  Full                                                        +---------+---------------+---------+-----------+----------+--------------+ FV Mid   Full                                                        +---------+---------------+---------+-----------+----------+--------------+ FV DistalFull                                                        +---------+---------------+---------+-----------+----------+--------------+ PFV      Full                                                        +---------+---------------+---------+-----------+----------+--------------+ POP      Full           Yes      Yes                                 +---------+---------------+---------+-----------+----------+--------------+ PTV      Full                                                        +---------+---------------+---------+-----------+----------+--------------+ PERO     Full                                                        +---------+---------------+---------+-----------+----------+--------------+   +---------+---------------+---------+-----------+----------+--------------+ LEFT     CompressibilityPhasicitySpontaneityPropertiesThrombus Aging +---------+---------------+---------+-----------+----------+--------------+ CFV      Full           Yes      Yes                                  +---------+---------------+---------+-----------+----------+--------------+ SFJ      Full                                                        +---------+---------------+---------+-----------+----------+--------------+ FV Prox  Full                                                        +---------+---------------+---------+-----------+----------+--------------+  FV Mid   Full                                                        +---------+---------------+---------+-----------+----------+--------------+ FV DistalFull                                                        +---------+---------------+---------+-----------+----------+--------------+ PFV      Full                                                        +---------+---------------+---------+-----------+----------+--------------+ POP      Full           Yes      Yes                                 +---------+---------------+---------+-----------+----------+--------------+ PTV      Full                                                        +---------+---------------+---------+-----------+----------+--------------+ PERO                                                  Not visualized +---------+---------------+---------+-----------+----------+--------------+     Summary: BILATERAL: - No evidence of deep vein thrombosis seen in the lower extremities, bilaterally. - No evidence of superficial venous thrombosis in the lower extremities, bilaterally. -   *See table(s) above for measurements and observations. Electronically signed by Monica Martinez MD on 05/25/2020 at 4:15:44 PM.    Final       Active Problems:   Essential hypertension, benign   GERD (gastroesophageal reflux disease)   Hyperlipidemia   Diverticulitis   History of stroke   Anemia   Nausea and vomiting   AKI (acute kidney injury) ([place])   Dehydration   Hypomagnesemia   Hypokalemia   Tobacco abuse   Emphysema of lung  ([place])     LOS: 3 days   Tye Savoy ,NP 05/27/2020, 9:08 AM

## 2020-05-28 LAB — COMPREHENSIVE METABOLIC PANEL
ALT: 9 U/L (ref 0–44)
AST: 14 U/L — ABNORMAL LOW (ref 15–41)
Albumin: 2 g/dL — ABNORMAL LOW (ref 3.5–5.0)
Alkaline Phosphatase: 90 U/L (ref 38–126)
Anion gap: 14 (ref 5–15)
BUN: 23 mg/dL (ref 8–23)
CO2: 17 mmol/L — ABNORMAL LOW (ref 22–32)
Calcium: 7.6 mg/dL — ABNORMAL LOW (ref 8.9–10.3)
Chloride: 111 mmol/L (ref 98–111)
Creatinine, Ser: 3.19 mg/dL — ABNORMAL HIGH (ref 0.44–1.00)
GFR calc Af Amer: 17 mL/min — ABNORMAL LOW (ref >60)
GFR calc non Af Amer: 15 mL/min — ABNORMAL LOW (ref >60)
Glucose, Bld: 203 mg/dL — ABNORMAL HIGH (ref 70–99)
Potassium: 3.8 mmol/L (ref 3.5–5.1)
Sodium: 142 mmol/L (ref 135–145)
Total Bilirubin: 0.2 mg/dL — ABNORMAL LOW (ref 0.3–1.2)
Total Protein: 5.6 g/dL — ABNORMAL LOW (ref 6.5–8.1)

## 2020-05-28 LAB — CBC
HCT: 35.1 % — ABNORMAL LOW (ref 36.0–46.0)
Hemoglobin: 10.7 g/dL — ABNORMAL LOW (ref 12.0–15.0)
MCH: 27.3 pg (ref 26.0–34.0)
MCHC: 30.5 g/dL (ref 30.0–36.0)
MCV: 89.5 fL (ref 80.0–100.0)
Platelets: 472 10*3/uL — ABNORMAL HIGH (ref 150–400)
RBC: 3.92 MIL/uL (ref 3.87–5.11)
RDW: 14.2 % (ref 11.5–15.5)
WBC: 14.2 10*3/uL — ABNORMAL HIGH (ref 4.0–10.5)
nRBC: 0 % (ref 0.0–0.2)

## 2020-05-28 LAB — GLUCOSE, CAPILLARY
Glucose-Capillary: 102 mg/dL — ABNORMAL HIGH (ref 70–99)
Glucose-Capillary: 128 mg/dL — ABNORMAL HIGH (ref 70–99)
Glucose-Capillary: 147 mg/dL — ABNORMAL HIGH (ref 70–99)
Glucose-Capillary: 170 mg/dL — ABNORMAL HIGH (ref 70–99)
Glucose-Capillary: 177 mg/dL — ABNORMAL HIGH (ref 70–99)
Glucose-Capillary: 76 mg/dL (ref 70–99)
Glucose-Capillary: 97 mg/dL (ref 70–99)

## 2020-05-28 LAB — CORTISOL: Cortisol, Plasma: 18.3 ug/dL

## 2020-05-28 MED ORDER — ONDANSETRON HCL 4 MG/2ML IJ SOLN
4.0000 mg | Freq: Four times a day (QID) | INTRAMUSCULAR | Status: DC | PRN
  Administered 2020-05-29: 4 mg via INTRAVENOUS
  Filled 2020-05-28: qty 2

## 2020-05-28 MED ORDER — SODIUM CHLORIDE 0.9 % IV SOLN
INTRAVENOUS | Status: DC | PRN
  Administered 2020-05-28: 250 mL via INTRAVENOUS

## 2020-05-28 NOTE — Progress Notes (Signed)
RN rounded on pt. Pt states she does not need anything at this time. Pt's daughter at bedside.

## 2020-05-28 NOTE — Progress Notes (Addendum)
PROGRESS NOTE    Tina Gomez  CZY:[id number] DOB: 1956/02/07 DOA: 05/24/2020 PCP: Charlott Rakes, MD  Brief Narrative: 64 year old female with history of COPD, tobacco abuse, CVA with right hemiplegia, type 2 diabetes mellitus, hypertension sent to the ED by PCP due to acute kidney injury along with ongoing nausea, vomiting, very poor oral intake for 3 to 6 months.  Vomiting worse in the morning, also reports diarrhea for the last several weeks, denies any abdominal pain.  Also history of 40 pound weight loss -Work-up in the ED noted acute kidney injury of creatinine greater than 4, severe hypokalemia, hypocalcemia, hypomagnesemia, albumin of 1.9, normal LFTs, hemoglobin of 9.8. -CT abdomen pelvis without contrast noted sigmoid diverticulitis, emphysema and hepatomegaly.  Assessment & Plan:   Chronic Nausea vomiting  recent diarrhea 40 pound weight loss -Symptoms found ongoing for about 3 months, CT abdomen pelvis without contrast raised concern for noncomplicated sigmoid diverticulitis, however exam not consistent with this -On empiric IV ceftriaxone and Flagyl day 4 of ceftriaxone, continue for 1 more day, due to worsening nausea stopped flagyl -I started scheduled Zofran and prn Phenergan yesterday, GI added scheduled Reglan, will change zofran to PRN today , stop reglan and narcotics today for gastric emptying scan tomorrow -Endoscopy 9/18 was unremarkable except for distal esophageal ring -Gastroenterology following -ordered gastric emptying scan for gastroparesis, will cancel barium esophagram, per GI esophageal dysmotility is less likely -plan for colonoscopy in 4-6 weeks  Acute kidney injury on chronic kidney disease stage IIIa -Baseline creatinine was 1.79 in 11/2018, this was greater than 4 on admission  -Suspect has ATN now , from prerenal etiology with ongoing GI losses, worsened by HCTZ and lisinopril use -ACE inhibitor on hold, CT without hydronephrosis -Continue IV fluids  today, creatinine was finally starting to improve -Labs still pending this morning, will follow up  Hepatomegaly Hypoalbuminemia -Wonder if she has NASH, denies EtOH abuse -Hep C antibody negative  Severe hypokalemia Hypomagnesemia -Due to GI losses, replaced -BMP in a.m.  Iron deficiency anemia -Continue GI work-up as noted above, plan for outpatient colonoscopy -CBC pending  History of CVA  Tobacco abuse COPD  Diabetes mellitus -Hold glipizide, CBGs are stable, continue sliding scale insulin -Hemoglobin A1c is 7.9  Hypertension -Continue Coreg, hold lisinopril  Severe protein calorie malnutrition -40 pound weight loss, albumin of 1.9 -RD consult  DVT prophylaxis: Lovenox after endoscopy Code Status: Full code Family Communication: Daughter at bedside 9/16 Disposition Plan:  Status is: Inpatient  Remains inpatient appropriate because:Inpatient level of care appropriate due to severity of illness   Dispo: The patient is from: Home              Anticipated d/c is to: Home              Anticipated d/c date is: 1 to 2 days              Patient currently is not medically stable to d/c.  Consultants:   Gastroenterology   Procedures:   Antimicrobials:    Subjective: -Finally starting to feel little better, eating eggs this morning, did not vomit last night  Objective: Vitals:   05/27/20 2146 05/27/20 2148 05/28/20 0448 05/28/20 0837  BP: (!) 139/110 (!) 157/76 (!) 175/76 (!) 167/77  Pulse: 73 70 70 72  Resp:  17 18   Temp:  98.1 F (36.7 C) 98 F (36.7 C)   TempSrc:  Oral Oral   SpO2: (!) 88% (!) 88% 93%  Weight:   90.9 kg   Height:        Intake/Output Summary (Last 24 hours) at 05/28/2020 1047 Last data filed at 05/28/2020 0900 Gross per 24 hour  Intake 723 ml  Output --  Net 723 ml   Filed Weights   05/26/20 0030 05/27/20 0502 05/28/20 0448  Weight: 88 kg 90.2 kg 90.9 kg    Examination:  General exam: Pleasant obese female appears  older than stated age 68: No JVD CVS: S1-S2, regular rate rhythm Lungs: Poor air movement bilaterally Abdomen: Soft, nontender, bowel sounds present Remedies: No edema  Psychiatry:  Mood & affect appropriate.     Data Reviewed:   CBC: Recent Labs  Lab 05/24/20 1326 05/25/20 0107 05/25/20 0245 05/25/20 1053 05/26/20 0321 05/26/20 1121 05/27/20 0615  WBC 16.5*  --  13.5*  --  13.0*  --  12.5*  NEUTROABS  --   --  10.8*  --   --   --   --   HGB 10.4*   < > 9.8* 9.5* 10.0* 10.2* 10.2*  HCT 32.8*   < > 31.5* 28.0* 31.6* 30.0* 33.6*  MCV 86.8  --  90.3  --  88.0  --  90.3  PLT 440*  --  377  --  407*  --  408*   < > = values in this interval not displayed.   Basic Metabolic Panel: Recent Labs  Lab 05/24/20 1326 05/24/20 1400 05/25/20 0032 05/25/20 0107 05/25/20 0245 05/25/20 0245 05/25/20 1042 05/25/20 1053 05/26/20 0321 05/26/20 1121 05/27/20 0615  NA   < >  --  143   < > 141   < > 142 143 143 145 142  K   < >  --  2.3*   < > 2.4*   < > 2.5* 2.5* 2.9* 3.5 3.5  CL   < >  --  107   < > 106   < > 106 106 111 113* 114*  CO2   < >  --  24  --  21*  --  22  --  21*  --  17*  GLUCOSE   < >  --  176*   < > 205*   < > 132* 128* 128* 123* 133*  BUN   < >  --  44*   < > 44*   < > 41* 38* 34* 30* 25*  CREATININE   < >  --  3.56*   < > 3.47*   < > 3.39* 3.60* 3.25* 3.30* 3.13*  CALCIUM   < >  --  6.2*  --  6.4*  --  6.6*  --  6.5*  --  7.4*  MG  --  1.4*  --   --  1.9  --   --   --   --   --   --   PHOS  --   --  5.6*  --  5.2*  --   --   --   --   --   --    < > = values in this interval not displayed.   GFR: Estimated Creatinine Clearance: 19.3 mL/min (A) (by C-G formula based on SCr of 3.13 mg/dL (H)). Liver Function Tests: Recent Labs  Lab 05/23/20 1656 05/24/20 1326 05/25/20 0245 05/26/20 0321 05/27/20 0615  AST 4 11* 10* 12* 11*  ALT 4 8 9 9 10   ALKPHOS 118 91 75 85 79  BILITOT <0.2 0.4 0.4 0.3  0.6  PROT 5.8* 6.1* 5.4* 5.6* 5.7*  ALBUMIN 3.0* 2.2* 1.9*  1.9* 2.0*   Recent Labs  Lab 05/24/20 1326  LIPASE 26   No results for input(s): AMMONIA in the last 168 hours. Coagulation Profile: No results for input(s): INR, PROTIME in the last 168 hours. Cardiac Enzymes: Recent Labs  Lab 05/25/20 0032  CKTOTAL 69   BNP (last 3 results) No results for input(s): PROBNP in the last 8760 hours. HbA1C: No results for input(s): HGBA1C in the last 72 hours. CBG: Recent Labs  Lab 05/27/20 1725 05/27/20 2147 05/28/20 0026 05/28/20 0436 05/28/20 0746  GLUCAP 119* 139* 76 102* 128*   Lipid Profile: No results for input(s): CHOL, HDL, LDLCALC, TRIG, CHOLHDL, LDLDIRECT in the last 72 hours. Thyroid Function Tests: No results for input(s): TSH, T4TOTAL, FREET4, T3FREE, THYROIDAB in the last 72 hours. Anemia Panel: No results for input(s): VITAMINB12, FOLATE, FERRITIN, TIBC, IRON, RETICCTPCT in the last 72 hours. Urine analysis:    Component Value Date/Time   COLORURINE YELLOW 05/24/2020 1324   APPEARANCEUR HAZY (A) 05/24/2020 1324   LABSPEC 1.012 05/24/2020 1324   PHURINE 6.0 05/24/2020 1324   GLUCOSEU 50 (A) 05/24/2020 1324   HGBUR NEGATIVE 05/24/2020 1324   BILIRUBINUR NEGATIVE 05/24/2020 1324   KETONESUR NEGATIVE 05/24/2020 1324   PROTEINUR >=300 (A) 05/24/2020 1324   UROBILINOGEN 1.0 10/22/2013 1626   NITRITE NEGATIVE 05/24/2020 1324   LEUKOCYTESUR NEGATIVE 05/24/2020 1324   Sepsis Labs: @LABRCNTIP (procalcitonin:4,lacticidven:4)  ) Recent Results (from the past 240 hour(s))  SARS Coronavirus 2 by RT PCR (hospital order, performed in [facility] hospital lab) Nasopharyngeal Nasopharyngeal Swab     Status: None   Collection Time: 05/24/20  5:00 PM   Specimen: Nasopharyngeal Swab  Result Value Ref Range Status   SARS Coronavirus 2 NEGATIVE NEGATIVE Final    Comment: (NOTE) SARS-CoV-2 target nucleic acids are NOT DETECTED.  The SARS-CoV-2 RNA is generally detectable in upper and lower respiratory specimens during the acute  phase of infection. The lowest concentration of SARS-CoV-2 viral copies this assay can detect is 250 copies / mL. A negative result does not preclude SARS-CoV-2 infection and should not be used as the sole basis for treatment or other patient management decisions.  A negative result may occur with improper specimen collection / handling, submission of specimen other than nasopharyngeal swab, presence of viral mutation(s) within the areas targeted by this assay, and inadequate number of viral copies (<250 copies / mL). A negative result must be combined with clinical observations, patient history, and epidemiological information.  Fact Sheet for Patients:   StrictlyIdeas.no  Fact Sheet for Healthcare Providers: BankingDealers.co.za  This test is not yet approved or  cleared by the [place] FDA and has been authorized for detection and/or diagnosis of SARS-CoV-2 by FDA under an Emergency Use Authorization (EUA).  This EUA will remain in effect (meaning this test can be used) for the duration of the COVID-19 declaration under Section 564(b)(1) of the Act, 21 U.S.C. section 360bbb-3(b)(1), unless the authorization is terminated or revoked sooner.  Performed at [facility], [place] [address], [place], [place] [address]   Gastrointestinal Panel by PCR , Stool     Status: None   Collection Time: 05/25/20 12:32 AM   Specimen: Stool  Result Value Ref Range Status   Campylobacter species NOT DETECTED NOT DETECTED Final   Plesimonas shigelloides NOT DETECTED NOT DETECTED Final   Salmonella species NOT DETECTED NOT DETECTED Final   Yersinia  enterocolitica NOT DETECTED NOT DETECTED Final   Vibrio species NOT DETECTED NOT DETECTED Final   Vibrio cholerae NOT DETECTED NOT DETECTED Final   Enteroaggregative E coli (EAEC) NOT DETECTED NOT DETECTED Final   Enteropathogenic E coli (EPEC) NOT DETECTED NOT DETECTED Final   Enterotoxigenic E  coli (ETEC) NOT DETECTED NOT DETECTED Final   Shiga like toxin producing E coli (STEC) NOT DETECTED NOT DETECTED Final   Shigella/Enteroinvasive E coli (EIEC) NOT DETECTED NOT DETECTED Final   Cryptosporidium NOT DETECTED NOT DETECTED Final   Cyclospora cayetanensis NOT DETECTED NOT DETECTED Final   Entamoeba histolytica NOT DETECTED NOT DETECTED Final   Giardia lamblia NOT DETECTED NOT DETECTED Final   Adenovirus F40/41 NOT DETECTED NOT DETECTED Final   Astrovirus NOT DETECTED NOT DETECTED Final   Norovirus GI/GII NOT DETECTED NOT DETECTED Final   Rotavirus A NOT DETECTED NOT DETECTED Final   Sapovirus (I, II, IV, and V) NOT DETECTED NOT DETECTED Final    Comment: Performed at [facility], [address] [place], [place] [address]  C Difficile Quick Screen w PCR reflex     Status: None   Collection Time: 05/25/20 12:32 AM   Specimen: Stool  Result Value Ref Range Status   C Diff antigen NEGATIVE NEGATIVE Final   C Diff toxin NEGATIVE NEGATIVE Final   C Diff interpretation No C. difficile detected.  Final    Comment: Performed at [facility], [place] [address] [place],  [address]    Scheduled Meds: . (feeding supplement) PROSource Plus  30 mL Oral BID BM  . amLODipine  10 mg Oral Daily  . atorvastatin  40 mg Oral QPM  . carvedilol  3.125 mg Oral BID WC  . DULoxetine  60 mg Oral Daily  . enoxaparin (LOVENOX) injection  30 mg Subcutaneous Q24H  . feeding supplement  1 Container Oral TID BM  . insulin aspart  0-9 Units Subcutaneous Q4H  . metoCLOPramide (REGLAN) injection  5 mg Intravenous TID AC & HS  . multivitamin with minerals  1 tablet Oral Daily  . ondansetron (ZOFRAN) IV  4 mg Intravenous TID AC  . pantoprazole (PROTONIX) IV  40 mg Intravenous Q12H  . sodium chloride flush  3 mL Intravenous Q12H   Continuous Infusions: . cefTRIAXone (ROCEPHIN)  IV 2 g (05/27/20 1958)     LOS: 4 days    Time spent: 92min  Domenic Polite, MD [facility] 05/28/2020, 10:47 AM

## 2020-05-28 NOTE — Progress Notes (Addendum)
Progress Note  Chief Complaint:   Nausea and vomiting     Attending physician's note   I have taken an interval history, reviewed the chart and examined the patient. I agree with the Advanced Practitioner's note, impression and recommendations.   Overall feels significantly better, is tolerating diet.  She feels Reglan is helping with her symptoms. Okay to continue to use low-dose Reglan as needed Small frequent meals Advised patient to avoid soda including [employer] or fruit juices or anything with high sugar content including excessive use of artificial sweeteners  She is scheduled for gastric emptying scan tomorrow  On broad-spectrum antibiotics for ?diverticulitis, clinically stable and is asymptomatic  GI will continue to follow along as inpatient, follow-up in office on discharge  I have spent 25 minutes of patient care (this includes precharting, chart review, review of results, face-to-face time used for counseling as well as treatment plan and follow-up. The patient was provided an opportunity to ask questions and all were answered. The patient agreed with the plan and demonstrated an understanding of the instructions.  Damaris Hippo , MD [phone]     ASSESSMENT / PLAN:    # Chronic nausea, vomiting ( 3 months) / loose stools over the last month / significant weight loss --Etiology not clear.  --Except for a distal esophageal ring her EGD was normal.   --Continue scheduled Zofran, PPI and I added low dose Reglan yesterday. .  --Patient says she is so much better today after adding Reglan. Tolerated solids, no nausea / vomiting at all since yesterday pm. Loose stools have resolved as well. Suspect N/V secondary to diabetic gastroparesis.  --Discussed importance of good glycemic control.  --Would leave on low dose Reglan ac and hs for now. Okay to continue Zofran but changing it to prn.   --C-diff and GI path panel negative. Loose stools have resolved.    --I see that patient is scheduled for a barium swallow. I don't it will be low yield. EGD unremarkable. Will consider gastric emptying study as an outpatient.   ADDENDUM: I see now that patient is also scheduled for gastric emptying scan inpatient. Hospitalist has already stopped reglan and narcotics for the study.    # H.pylori infection without PUD or gastritis on EGD.  --I don't think with N/V that she can tolerate antibiotics right now. Defer treatment. Marland Kitchen   #  ? Diverticulitis / Leukocytosis.   --WBC improving 13 >> 12.5 --Clinically she doesn't appear to have diverticulitis, (hasn't been having any significant abdominal pain)  --Given weight loss and CT scan findings she will need a colonoscopy in 4-6 weeks.   # AKI on CKD       SUBJECTIVE:   She feels much better, no N/V. Tolerating solids. Feels like Reglan has helped.     OBJECTIVE:    Scheduled inpatient medications:  . (feeding supplement) PROSource Plus  30 mL Oral BID BM  . amLODipine  10 mg Oral Daily  . atorvastatin  40 mg Oral QPM  . carvedilol  3.125 mg Oral BID WC  . DULoxetine  60 mg Oral Daily  . enoxaparin (LOVENOX) injection  30 mg Subcutaneous Q24H  . feeding supplement  1 Container Oral TID BM  . insulin aspart  0-9 Units Subcutaneous Q4H  . metoCLOPramide (REGLAN) injection  5 mg Intravenous TID AC & HS  . multivitamin with minerals  1 tablet Oral Daily  . ondansetron (ZOFRAN) IV  4 mg Intravenous TID  AC  . pantoprazole (PROTONIX) IV  40 mg Intravenous Q12H  . sodium chloride flush  3 mL Intravenous Q12H   Continuous inpatient infusions:  . cefTRIAXone (ROCEPHIN)  IV 2 g (05/27/20 1958)   PRN inpatient medications: acetaminophen **OR** acetaminophen, hydrALAZINE, HYDROmorphone (DILAUDID) injection, loperamide, promethazine  Vital signs in last 24 hours: Temp:  [97.9 F (36.6 C)-98.1 F (36.7 C)] 98 F (36.7 C) (09/19 0448) Pulse Rate:  [70-73] 72 (09/19 0837) Resp:  [17-19] 18 (09/19  0448) BP: (139-175)/(62-110) 167/77 (09/19 0837) SpO2:  [88 %-95 %] 93 % (09/19 0448) Weight:  [90.9 kg] 90.9 kg (09/19 0448) Last BM Date: 05/27/20  Intake/Output Summary (Last 24 hours) at 05/28/2020 1048 Last data filed at 05/28/2020 0900 Gross per 24 hour  Intake 723 ml  Output --  Net 723 ml     Physical Exam:  . General: Alert female in NAD . Heart:  Regular rate and rhythm . Pulmonary: Normal respiratory effort . Abdomen: Soft, nondistended, nontender. Normal bowel sounds. No masses felt. . Neurologic: Alert and oriented . Psych: Pleasant. Cooperative.   Filed Weights   05/26/20 0030 05/27/20 0502 05/28/20 0448  Weight: 88 kg 90.2 kg 90.9 kg    Intake/Output from previous day: 09/18 0701 - 09/19 0700 In: 1200 [P.O.:1200] Out: -  Intake/Output this shift: Total I/O In: 243 [P.O.:240; I.V.:3] Out: -     Lab Results: Recent Labs    05/26/20 0321 05/26/20 1121 05/27/20 0615  WBC 13.0*  --  12.5*  HGB 10.0* 10.2* 10.2*  HCT 31.6* 30.0* 33.6*  PLT 407*  --  408*   BMET Recent Labs    05/26/20 0321 05/26/20 1121 05/27/20 0615  NA 143 145 142  K 2.9* 3.5 3.5  CL 111 113* 114*  CO2 21*  --  17*  GLUCOSE 128* 123* 133*  BUN 34* 30* 25*  CREATININE 3.25* 3.30* 3.13*  CALCIUM 6.5*  --  7.4*   LFT Recent Labs    05/27/20 0615  PROT 5.7*  ALBUMIN 2.0*  AST 11*  ALT 10  ALKPHOS 79  BILITOT 0.6   PT/INR No results for input(s): LABPROT, INR in the last 72 hours. Hepatitis Panel Recent Labs    05/27/20 0615  HCVAB NON REACTIVE    No results found.    Active Problems:   Essential hypertension, benign   GERD (gastroesophageal reflux disease)   Hyperlipidemia   Diverticulitis   History of stroke   Anemia   Nausea and vomiting   AKI (acute kidney injury) ([place])   Dehydration   Hypomagnesemia   Hypokalemia   Tobacco abuse   Emphysema of lung ([place])     LOS: 4 days   Tye Savoy ,NP 05/28/2020, 10:48 AM

## 2020-05-28 NOTE — Progress Notes (Signed)
   05/28/20 1545  Clinical Encounter Type  Visited With Patient and family together  Visit Type Spiritual support  Referral From Nurse  Consult/Referral To Chaplain  Visited patient. Daughter was also at bedside. Daughter requested prayer.This note was prepared by Jeanine Luz, M.Div..  For questions please contact by phone [phone].

## 2020-05-29 ENCOUNTER — Inpatient Hospital Stay (HOSPITAL_COMMUNITY): Payer: Medicaid Other

## 2020-05-29 LAB — CBC
HCT: 35 % — ABNORMAL LOW (ref 36.0–46.0)
Hemoglobin: 10.9 g/dL — ABNORMAL LOW (ref 12.0–15.0)
MCH: 28 pg (ref 26.0–34.0)
MCHC: 31.1 g/dL (ref 30.0–36.0)
MCV: 90 fL (ref 80.0–100.0)
Platelets: 456 10*3/uL — ABNORMAL HIGH (ref 150–400)
RBC: 3.89 MIL/uL (ref 3.87–5.11)
RDW: 14.3 % (ref 11.5–15.5)
WBC: 12.8 10*3/uL — ABNORMAL HIGH (ref 4.0–10.5)
nRBC: 0 % (ref 0.0–0.2)

## 2020-05-29 LAB — COMPREHENSIVE METABOLIC PANEL
ALT: 10 U/L (ref 0–44)
AST: 13 U/L — ABNORMAL LOW (ref 15–41)
Albumin: 1.9 g/dL — ABNORMAL LOW (ref 3.5–5.0)
Alkaline Phosphatase: 85 U/L (ref 38–126)
Anion gap: 9 (ref 5–15)
BUN: 23 mg/dL (ref 8–23)
CO2: 20 mmol/L — ABNORMAL LOW (ref 22–32)
Calcium: 7.6 mg/dL — ABNORMAL LOW (ref 8.9–10.3)
Chloride: 114 mmol/L — ABNORMAL HIGH (ref 98–111)
Creatinine, Ser: 3.39 mg/dL — ABNORMAL HIGH (ref 0.44–1.00)
GFR calc Af Amer: 16 mL/min — ABNORMAL LOW (ref >60)
GFR calc non Af Amer: 14 mL/min — ABNORMAL LOW (ref >60)
Glucose, Bld: 116 mg/dL — ABNORMAL HIGH (ref 70–99)
Potassium: 3.2 mmol/L — ABNORMAL LOW (ref 3.5–5.1)
Sodium: 143 mmol/L (ref 135–145)
Total Bilirubin: 0.4 mg/dL (ref 0.3–1.2)
Total Protein: 5.5 g/dL — ABNORMAL LOW (ref 6.5–8.1)

## 2020-05-29 LAB — GLUCOSE, CAPILLARY
Glucose-Capillary: 111 mg/dL — ABNORMAL HIGH (ref 70–99)
Glucose-Capillary: 130 mg/dL — ABNORMAL HIGH (ref 70–99)
Glucose-Capillary: 135 mg/dL — ABNORMAL HIGH (ref 70–99)
Glucose-Capillary: 116 mg/dL — ABNORMAL HIGH (ref 70–99)
Glucose-Capillary: 108 mg/dL — ABNORMAL HIGH (ref 70–99)
Glucose-Capillary: 108 mg/dL — ABNORMAL HIGH (ref 70–99)

## 2020-05-29 MED ORDER — GLUCERNA SHAKE PO LIQD: 237.0000 mL | Freq: Three times a day (TID) | ORAL | Status: DC

## 2020-05-29 MED ORDER — SODIUM CHLORIDE 0.9 % IV SOLN: INTRAVENOUS | Status: DC

## 2020-05-29 MED ORDER — ONDANSETRON HCL 4 MG/2ML IJ SOLN
4.0000 mg | Freq: Three times a day (TID) | INTRAMUSCULAR | Status: DC
  Administered 2020-05-29 – 2020-05-31 (×5): 4 mg via INTRAVENOUS
  Filled 2020-05-29 (×5): qty 2

## 2020-05-29 MED ORDER — SODIUM CHLORIDE 0.9 % IV SOLN
2.0000 g | INTRAVENOUS | Status: AC
  Administered 2020-05-29: 2 g via INTRAVENOUS
  Filled 2020-05-29: qty 20

## 2020-05-29 NOTE — Progress Notes (Signed)
Daily Rounding Note  05/29/2020, 8:23 AM  LOS: 5 days   SUBJECTIVE:   Chief complaint:     Last dose of IV reglan was 1118 AM yesterday.  N/V started back this AM.  Awaiting transport to nuc med for GES.  No abdominal pain.  No diarrhea  OBJECTIVE:         Vital signs in last 24 hours:    Temp:  [98.2 F (36.8 C)-98.8 F (37.1 C)] 98.2 F (36.8 C) (09/20 0745) Pulse Rate:  [66-73] 72 (09/20 0745) Resp:  [17-20] 18 (09/20 0745) BP: (150-177)/(60-80) 173/60 (09/20 0745) SpO2:  [93 %-95 %] 95 % (09/20 0745) Weight:  [90.9 kg] 90.9 kg (09/20 0405) Last BM Date: 05/27/20 Filed Weights   05/27/20 0502 05/28/20 0448 05/29/20 0405  Weight: 90.2 kg 90.9 kg 90.9 kg   General: looks unwell and uncomfortable   Heart: RRR Chest: clear bil Abdomen: soft, obese, NT.  BS present  Extremities: no CCE Neuro/Psych:  Oriented x 3.  Alert.  No tremor.    Intake/Output from previous day: 09/19 0701 - 09/20 0700 In: 1404.6 [P.O.:1200; I.V.:4.6; IV Piggyback:200] Out: 700 [Urine:700]  Intake/Output this shift: Total I/O In: -  Out: 700 [Urine:700]  Lab Results: Recent Labs    05/27/20 0615 05/28/20 1217 05/29/20 0419  WBC 12.5* 14.2* 12.8*  HGB 10.2* 10.7* 10.9*  HCT 33.6* 35.1* 35.0*  PLT 408* 472* 456*   BMET Recent Labs    05/27/20 0615 05/28/20 1217 05/29/20 0419  NA 142 142 143  K 3.5 3.8 3.2*  CL 114* 111 114*  CO2 17* 17* 20*  GLUCOSE 133* 203* 116*  BUN 25* 23 23  CREATININE 3.13* 3.19* 3.39*  CALCIUM 7.4* 7.6* 7.6*   LFT Recent Labs    05/27/20 0615 05/28/20 1217 05/29/20 0419  PROT 5.7* 5.6* 5.5*  ALBUMIN 2.0* 2.0* 1.9*  AST 11* 14* 13*  ALT 10 9 10   ALKPHOS 79 90 85  BILITOT 0.6 0.2* 0.4   PT/INR No results for input(s): LABPROT, INR in the last 72 hours. Hepatitis Panel Recent Labs    05/27/20 0615  HCVAB NON REACTIVE    Studies/Results: No results found.   Scheduled Meds: .  (feeding supplement) PROSource Plus  30 mL Oral BID BM  . amLODipine  10 mg Oral Daily  . atorvastatin  40 mg Oral QPM  . carvedilol  3.125 mg Oral BID WC  . DULoxetine  60 mg Oral Daily  . enoxaparin (LOVENOX) injection  30 mg Subcutaneous Q24H  . feeding supplement  1 Container Oral TID BM  . insulin aspart  0-9 Units Subcutaneous Q4H  . multivitamin with minerals  1 tablet Oral Daily  . pantoprazole (PROTONIX) IV  40 mg Intravenous Q12H  . sodium chloride flush  3 mL Intravenous Q12H   Continuous Infusions: . sodium chloride Stopped (05/28/20 2248)  . sodium chloride 75 mL/hr at 05/29/20 0806  . cefTRIAXone (ROCEPHIN)  IV Stopped (05/28/20 2235)   PRN Meds:.sodium chloride, acetaminophen **OR** acetaminophen, hydrALAZINE, loperamide, ondansetron (ZOFRAN) IV, promethazine   ASSESMENT:   *  N/V but no abdpain for many weeks.   CTAP w/o contrast: sigmoid diverticulosis w/o complication.  Hepatomegaly.  Cystic changes in bil LL lungs.  Clinical picture is not c/w diverticulitis.   H pylori breath test positive.  Holding abx for now given likely would not tolerated po abx.    CT head: minor small  vessel ischemia.  9/17 EGD: non-obsrtucting large distal esoph ring, O/w normal study.  Day 6 Rocephin.  Completed 3 d flagyl on 9/17.     sxs improved on scheduled, low dose, po Reglan.  Nuc med GES ordered   *     anemia.  Stable Low iron, low tibc.  Sats/ferritin/B12/folate ok.    *    Hypokalemia.    *     AKI.  Some but not dramatic improvement.    Looks to have CKD.    *     DM, not well controlled.  Now on insulin.         PLAN   *    Await findings of GES.    *   Stop IV Protonix.  Resume po Reglan 5 mg po AC/HS after GES completed.     *   outpt colonoscopy within 4 to 6 weeks, allow time for ? Diverticulitis to resolve.       Tina Gomez  05/29/2020, 8:23 AM Phone [phone]

## 2020-05-29 NOTE — Plan of Care (Signed)
  Problem: Nutrition: Goal: Adequate nutrition will be maintained Outcome: Progressing   Problem: Pain Managment: Goal: General experience of comfort will improve Outcome: Progressing   

## 2020-05-29 NOTE — Progress Notes (Signed)
PROGRESS NOTE    Tina Gomez  HYI:[id number] DOB: 1956-05-29 DOA: 05/24/2020 PCP: Charlott Rakes, MD  Brief Narrative: 64 year old female with history of COPD, tobacco abuse, CVA with right hemiplegia, type 2 diabetes mellitus, hypertension sent to the ED by PCP due to acute kidney injury along with ongoing nausea, vomiting, very poor oral intake for 3 to 6 months.  Vomiting worse in the morning, also reports diarrhea for the last several weeks, denies any abdominal pain.  Also history of 40 pound weight loss -Work-up in the ED noted acute kidney injury of creatinine greater than 4, severe hypokalemia, hypocalcemia, hypomagnesemia, albumin of 1.9, normal LFTs, hemoglobin of 9.8. -CT abdomen pelvis without contrast noted sigmoid diverticulitis, emphysema and hepatomegaly.  Assessment & Plan:   Chronic Nausea vomiting  recent diarrhea 40 pound weight loss -Symptoms found ongoing for about 3 months, CT abdomen pelvis without contrast raised concern for noncomplicated sigmoid diverticulitis, however exam not consistent with this -On empiric IV ceftriaxone and Flagyl,  day 6 of ceftriaxone, stop antibiotics after todays dose -Endoscopy 9/18 was unremarkable except for distal esophageal ring -Gastroenterology following -Starting to improve on scheduled Reglan, now on hold for gastric emptying scan, this now apparently has to be postponed as patient was receiving Protonix -Continue Phenergan as needed  Acute kidney injury on chronic kidney disease stage IIIa -Baseline creatinine was 1.79 in 11/2018, this was greater than 4 on admission  -Suspect has ATN now , from prerenal etiology with ongoing GI losses, worsened by HCTZ and lisinopril use -ACE inhibitor on hold, CT without hydronephrosis -Continue IV fluids today -BMP in am  Hepatomegaly Hypoalbuminemia -Wonder if she has NASH, denies EtOH abuse -Hep C antibody negative  Severe hypokalemia Hypomagnesemia -Due to GI losses,  replaced  Iron deficiency anemia -Continue GI work-up as noted above, plan for outpatient colonoscopy -hb stable now  History of CVA  Tobacco abuse COPD  Diabetes mellitus -Hold glipizide, CBGs are stable, continue sliding scale insulin -Hemoglobin A1c is 7.9  Hypertension -Continue Coreg, hold lisinopril  Severe protein calorie malnutrition -40 pound weight loss, albumin of 1.9 -RD consult  DVT prophylaxis: Lovenox after endoscopy Code Status: Full code Family Communication: no family at bedside Disposition Plan:  Status is: Inpatient  Remains inpatient appropriate because:Inpatient level of care appropriate due to severity of illness   Dispo: The patient is from: Home              Anticipated d/c is to: Home              Anticipated d/c date is: 2-3days              Patient currently is not medically stable to d/c.  Consultants:   Gastroenterology   Procedures: EGD 9/18  Antimicrobials:    Subjective: -Nauseated again this morning  Objective: Vitals:   05/28/20 1209 05/28/20 1940 05/29/20 0405 05/29/20 0745  BP: (!) 177/80 (!) 150/62 (!) 173/60 (!) 173/60  Pulse: 73 66 71 72  Resp: 18 17 20 18   Temp: 98.5 F (36.9 C) 98.8 F (37.1 C) 98.2 F (36.8 C) 98.2 F (36.8 C)  TempSrc: Oral Oral Oral Oral  SpO2: 94% 95% 93% 95%  Weight:   90.9 kg   Height:        Intake/Output Summary (Last 24 hours) at 05/29/2020 1128 Last data filed at 05/29/2020 0800 Gross per 24 hour  Intake 1161.64 ml  Output 1400 ml  Net -238.36 ml   [employer]  05/27/20 0502 05/28/20 0448 05/29/20 0405  Weight: 90.2 kg 90.9 kg 90.9 kg    Examination:  General exam: Pleasant obese female appears older than stated age, AAOx3, uncomfortable appearing HEENT: No JVD CVS: S1-S2, regular rate rhythm Lungs: Poor air movement bilaterally Abdomen: Soft, nontender, bowel sounds present Extremities: No edema  Psychiatry:  Mood & affect appropriate.     Data Reviewed:    CBC: Recent Labs  Lab 05/25/20 0245 05/25/20 1053 05/26/20 0321 05/26/20 1121 05/27/20 0615 05/28/20 1217 05/29/20 0419  WBC 13.5*  --  13.0*  --  12.5* 14.2* 12.8*  NEUTROABS 10.8*  --   --   --   --   --   --   HGB 9.8*   < > 10.0* 10.2* 10.2* 10.7* 10.9*  HCT 31.5*   < > 31.6* 30.0* 33.6* 35.1* 35.0*  MCV 90.3  --  88.0  --  90.3 89.5 90.0  PLT 377  --  407*  --  408* 472* 456*   < > = values in this interval not displayed.   Basic Metabolic Panel: Recent Labs  Lab 05/24/20 1326 05/24/20 1400 05/25/20 0032 05/25/20 0107 05/25/20 0245 05/25/20 0245 05/25/20 1042 05/25/20 1053 05/26/20 0321 05/26/20 1121 05/27/20 0615 05/28/20 1217 05/29/20 0419  NA   < >  --  143   < > 141   < > 142   < > 143 145 142 142 143  K   < >  --  2.3*   < > 2.4*   < > 2.5*   < > 2.9* 3.5 3.5 3.8 3.2*  CL   < >  --  107   < > 106   < > 106   < > 111 113* 114* 111 114*  CO2   < >  --  24   < > 21*   < > 22  --  21*  --  17* 17* 20*  GLUCOSE   < >  --  176*   < > 205*   < > 132*   < > 128* 123* 133* 203* 116*  BUN   < >  --  44*   < > 44*   < > 41*   < > 34* 30* 25* 23 23  CREATININE   < >  --  3.56*   < > 3.47*   < > 3.39*   < > 3.25* 3.30* 3.13* 3.19* 3.39*  CALCIUM   < >  --  6.2*   < > 6.4*   < > 6.6*  --  6.5*  --  7.4* 7.6* 7.6*  MG  --  1.4*  --   --  1.9  --   --   --   --   --   --   --   --   PHOS  --   --  5.6*  --  5.2*  --   --   --   --   --   --   --   --    < > = values in this interval not displayed.   GFR: Estimated Creatinine Clearance: 17.8 mL/min (A) (by C-G formula based on SCr of 3.39 mg/dL (H)). Liver Function Tests: Recent Labs  Lab 05/25/20 0245 05/26/20 0321 05/27/20 0615 05/28/20 1217 05/29/20 0419  AST 10* 12* 11* 14* 13*  ALT 9 9 10 9 10   ALKPHOS 75 85 79 90 85  BILITOT 0.4 0.3 0.6  0.2* 0.4  PROT 5.4* 5.6* 5.7* 5.6* 5.5*  ALBUMIN 1.9* 1.9* 2.0* 2.0* 1.9*   Recent Labs  Lab 05/24/20 1326  LIPASE 26   No results for input(s): AMMONIA in the  last 168 hours. Coagulation Profile: No results for input(s): INR, PROTIME in the last 168 hours. Cardiac Enzymes: Recent Labs  Lab 05/25/20 0032  CKTOTAL 69   BNP (last 3 results) No results for input(s): PROBNP in the last 8760 hours. HbA1C: No results for input(s): HGBA1C in the last 72 hours. CBG: Recent Labs  Lab 05/28/20 1632 05/28/20 1941 05/28/20 2329 05/29/20 0403 05/29/20 0746  GLUCAP 147* 177* 97 111* 130*   Lipid Profile: No results for input(s): CHOL, HDL, LDLCALC, TRIG, CHOLHDL, LDLDIRECT in the last 72 hours. Thyroid Function Tests: No results for input(s): TSH, T4TOTAL, FREET4, T3FREE, THYROIDAB in the last 72 hours. Anemia Panel: No results for input(s): VITAMINB12, FOLATE, FERRITIN, TIBC, IRON, RETICCTPCT in the last 72 hours. Urine analysis:    Component Value Date/Time   COLORURINE YELLOW 05/24/2020 1324   APPEARANCEUR HAZY (A) 05/24/2020 1324   LABSPEC 1.012 05/24/2020 1324   PHURINE 6.0 05/24/2020 1324   GLUCOSEU 50 (A) 05/24/2020 1324   HGBUR NEGATIVE 05/24/2020 1324   BILIRUBINUR NEGATIVE 05/24/2020 1324   KETONESUR NEGATIVE 05/24/2020 1324   PROTEINUR >=300 (A) 05/24/2020 1324   UROBILINOGEN 1.0 10/22/2013 1626   NITRITE NEGATIVE 05/24/2020 1324   LEUKOCYTESUR NEGATIVE 05/24/2020 1324   Sepsis Labs: @LABRCNTIP (procalcitonin:4,lacticidven:4)  ) Recent Results (from the past 240 hour(s))  SARS Coronavirus 2 by RT PCR (hospital order, performed in [place] hospital lab) Nasopharyngeal Nasopharyngeal Swab     Status: None   Collection Time: 05/24/20  5:00 PM   Specimen: Nasopharyngeal Swab  Result Value Ref Range Status   SARS Coronavirus 2 NEGATIVE NEGATIVE Final    Comment: (NOTE) SARS-CoV-2 target nucleic acids are NOT DETECTED.  The SARS-CoV-2 RNA is generally detectable in upper and lower respiratory specimens during the acute phase of infection. The lowest concentration of SARS-CoV-2 viral copies this assay can detect is  250 copies / mL. A negative result does not preclude SARS-CoV-2 infection and should not be used as the sole basis for treatment or other patient management decisions.  A negative result may occur with improper specimen collection / handling, submission of specimen other than nasopharyngeal swab, presence of viral mutation(s) within the areas targeted by this assay, and inadequate number of viral copies (<250 copies / mL). A negative result must be combined with clinical observations, patient history, and epidemiological information.  Fact Sheet for Patients:   StrictlyIdeas.no  Fact Sheet for Healthcare Providers: BankingDealers.co.za  This test is not yet approved or  cleared by the [place] FDA and has been authorized for detection and/or diagnosis of SARS-CoV-2 by FDA under an Emergency Use Authorization (EUA).  This EUA will remain in effect (meaning this test can be used) for the duration of the COVID-19 declaration under Section 564(b)(1) of the Act, 21 U.S.C. section 360bbb-3(b)(1), unless the authorization is terminated or revoked sooner.  Performed at [facility], [place] [address] [place], [place] [address]   Gastrointestinal Panel by PCR , Stool     Status: None   Collection Time: 05/25/20 12:32 AM   Specimen: Stool  Result Value Ref Range Status   Campylobacter species NOT DETECTED NOT DETECTED Final   Plesimonas shigelloides NOT DETECTED NOT DETECTED Final   Salmonella species NOT DETECTED NOT DETECTED Final  Yersinia enterocolitica NOT DETECTED NOT DETECTED Final   Vibrio species NOT DETECTED NOT DETECTED Final   Vibrio cholerae NOT DETECTED NOT DETECTED Final   Enteroaggregative E coli (EAEC) NOT DETECTED NOT DETECTED Final   Enteropathogenic E coli (EPEC) NOT DETECTED NOT DETECTED Final   Enterotoxigenic E coli (ETEC) NOT DETECTED NOT DETECTED Final   Shiga like toxin producing E coli (STEC) NOT DETECTED  NOT DETECTED Final   Shigella/Enteroinvasive E coli (EIEC) NOT DETECTED NOT DETECTED Final   Cryptosporidium NOT DETECTED NOT DETECTED Final   Cyclospora cayetanensis NOT DETECTED NOT DETECTED Final   Entamoeba histolytica NOT DETECTED NOT DETECTED Final   Giardia lamblia NOT DETECTED NOT DETECTED Final   Adenovirus F40/41 NOT DETECTED NOT DETECTED Final   Astrovirus NOT DETECTED NOT DETECTED Final   Norovirus GI/GII NOT DETECTED NOT DETECTED Final   Rotavirus A NOT DETECTED NOT DETECTED Final   Sapovirus (I, II, IV, and V) NOT DETECTED NOT DETECTED Final    Comment: Performed at [facility], [place] [place], [place] [address]  C Difficile Quick Screen w PCR reflex     Status: None   Collection Time: 05/25/20 12:32 AM   Specimen: Stool  Result Value Ref Range Status   C Diff antigen NEGATIVE NEGATIVE Final   C Diff toxin NEGATIVE NEGATIVE Final   C Diff interpretation No C. difficile detected.  Final    Comment: Performed at [facility], [place] [address], [place], [place] [address]    Scheduled Meds: . amLODipine  10 mg Oral Daily  . atorvastatin  40 mg Oral QPM  . carvedilol  3.125 mg Oral BID WC  . DULoxetine  60 mg Oral Daily  . enoxaparin (LOVENOX) injection  30 mg Subcutaneous Q24H  . insulin aspart  0-9 Units Subcutaneous Q4H  . multivitamin with minerals  1 tablet Oral Daily  . sodium chloride flush  3 mL Intravenous Q12H   Continuous Infusions: . sodium chloride Stopped (05/28/20 2248)  . sodium chloride 75 mL/hr at 05/29/20 0806  . cefTRIAXone (ROCEPHIN)  IV Stopped (05/28/20 2235)     LOS: 5 days   Time spent: 28min  Domenic Polite, MD [facility] 05/29/2020, 11:28 AM

## 2020-05-29 NOTE — Progress Notes (Signed)
Nutrition Follow-up  DOCUMENTATION CODES:   Obesity unspecified  INTERVENTION:   -Continue MVI with minerals daily -D/c Prosource Plus -D/c Boost Breeze po TID, each supplement provides 250 kcal and 9 grams of protein -Glucerna Shake po TID, each supplement provides 220 kcal and 10 grams of protein  NUTRITION DIAGNOSIS:   Inadequate oral intake related to altered GI function, nausea, vomiting as evidenced by per patient/family report.  Ongoing  GOAL:   Patient will meet greater than or equal to 90% of their needs  Progressing   MONITOR:   PO intake, Supplement acceptance, Diet advancement, Labs, Weight trends, Skin, I & O's  REASON FOR ASSESSMENT:   Malnutrition Screening Tool    ASSESSMENT:   Tina Gomez is a 64 y.o. female with medical history significant of HTn, hx of CVA with right side weakness, psoriasis, DM type II, GERD, Tobacco abuse. Presented with   6-3 m hx of N/v sometimes loose stools, decreased Po intake.  9/17- s/p EGD- revealed incidental benign large caliber distal esophageal ring  Reviewed I/O's: +705 ml x 24 hours and +6.6 L since admission  UOP: 700 mlx 24 hours  Pt out of room at time of visit. No family available to obtain further history at this time.   Per GI notes, pt for gastric emptying scan today.  Noted pt has been advanced to a soft diet; meal completion has improved (25-100%). Pt has been refusing Boost Breeze and Prosource Plus supplements.  Labs reviewed: K: 3.2. CBGS: 97-130 (inpatient orders for glycemic control are 0-9 units inuslin aspart every 4 hours).   Diet Order:   Diet Order            DIET SOFT Room service appropriate? Yes; Fluid consistency: Thin  Diet effective now                 EDUCATION NEEDS:   No education needs have been identified at this time  Skin:  Skin Assessment: Reviewed RN Assessment  Last BM:  05/27/20  Height:   Ht Readings from Last 1 Encounters:  05/25/20 5\' 2"  (1.575 m)     Weight:   Wt Readings from Last 1 Encounters:  05/29/20 90.9 kg    Ideal Body Weight:  50 kg  BMI:  Body mass index is 36.67 kg/m.  Estimated Nutritional Needs:   Kcal:  1550-1750  Protein:  85-100 grams  Fluid:  > 1.5 L    Loistine Chance, RD, LDN, [place] Registered Dietitian II Certified Diabetes Care and Education Specialist Please refer to [facility] for RD and/or RD on-call/weekend/after hours pager

## 2020-05-30 ENCOUNTER — Encounter (HOSPITAL_COMMUNITY): Payer: Self-pay | Admitting: Internal Medicine

## 2020-05-30 LAB — BASIC METABOLIC PANEL
Anion gap: 11 (ref 5–15)
BUN: 21 mg/dL (ref 8–23)
CO2: 19 mmol/L — ABNORMAL LOW (ref 22–32)
Calcium: 7.6 mg/dL — ABNORMAL LOW (ref 8.9–10.3)
Chloride: 112 mmol/L — ABNORMAL HIGH (ref 98–111)
Creatinine, Ser: 3.35 mg/dL — ABNORMAL HIGH (ref 0.44–1.00)
GFR calc Af Amer: 16 mL/min — ABNORMAL LOW (ref >60)
GFR calc non Af Amer: 14 mL/min — ABNORMAL LOW (ref >60)
Glucose, Bld: 120 mg/dL — ABNORMAL HIGH (ref 70–99)
Potassium: 3.2 mmol/L — ABNORMAL LOW (ref 3.5–5.1)
Sodium: 142 mmol/L (ref 135–145)

## 2020-05-30 LAB — CBC
HCT: 35.1 % — ABNORMAL LOW (ref 36.0–46.0)
Hemoglobin: 10.8 g/dL — ABNORMAL LOW (ref 12.0–15.0)
MCH: 27.4 pg (ref 26.0–34.0)
MCHC: 30.8 g/dL (ref 30.0–36.0)
MCV: 89.1 fL (ref 80.0–100.0)
Platelets: 452 10*3/uL — ABNORMAL HIGH (ref 150–400)
RBC: 3.94 MIL/uL (ref 3.87–5.11)
RDW: 14.2 % (ref 11.5–15.5)
WBC: 14.9 10*3/uL — ABNORMAL HIGH (ref 4.0–10.5)
nRBC: 0 % (ref 0.0–0.2)

## 2020-05-30 LAB — GLUCOSE, CAPILLARY
Glucose-Capillary: 118 mg/dL — ABNORMAL HIGH (ref 70–99)
Glucose-Capillary: 126 mg/dL — ABNORMAL HIGH (ref 70–99)
Glucose-Capillary: 127 mg/dL — ABNORMAL HIGH (ref 70–99)
Glucose-Capillary: 70 mg/dL (ref 70–99)
Glucose-Capillary: 82 mg/dL (ref 70–99)
Glucose-Capillary: 103 mg/dL — ABNORMAL HIGH (ref 70–99)

## 2020-05-30 MED ORDER — SODIUM CHLORIDE 0.9 % IV SOLN: INTRAVENOUS | Status: AC

## 2020-05-30 NOTE — Progress Notes (Signed)
Offered bath and linen change earlier this shift , pt. refused.

## 2020-05-30 NOTE — Progress Notes (Signed)
Daily Rounding Note  05/30/2020, 8:17 AM  LOS: 6 days   SUBJECTIVE:   Chief complaint: N/V.       OBJECTIVE:         Vital signs in last 24 hours:    Temp:  [97.7 F (36.5 C)-97.8 F (36.6 C)] 97.7 F (36.5 C) (09/21 0343) Pulse Rate:  [68-73] 73 (09/21 0343) Resp:  [15-18] 15 (09/21 0343) BP: (162-169)/(65-81) 169/69 (09/21 0343) SpO2:  [92 %-93 %] 93 % (09/21 0343) Weight:  [90.5 kg] 90.5 kg (09/21 0336) Last BM Date: 05/28/20 Filed Weights   05/28/20 0448 05/29/20 0405 05/30/20 0336  Weight: 90.9 kg 90.9 kg 90.5 kg   Did not re-examine.    Intake/Output from previous day: 09/20 0701 - 09/21 0700 In: 1506.4 [P.O.:580; I.V.:926.4] Out: 1100 [Urine:1100]  Intake/Output this shift: No intake/output data recorded.  Lab Results: Recent Labs    05/28/20 1217 05/29/20 0419 05/30/20 0345  WBC 14.2* 12.8* 14.9*  HGB 10.7* 10.9* 10.8*  HCT 35.1* 35.0* 35.1*  PLT 472* 456* 452*   BMET Recent Labs    05/28/20 1217 05/29/20 0419 05/30/20 0345  NA 142 143 142  K 3.8 3.2* 3.2*  CL 111 114* 112*  CO2 17* 20* 19*  GLUCOSE 203* 116* 120*  BUN 23 23 21   CREATININE 3.19* 3.39* 3.35*  CALCIUM 7.6* 7.6* 7.6*   LFT Recent Labs    05/28/20 1217 05/29/20 0419  PROT 5.6* 5.5*  ALBUMIN 2.0* 1.9*  AST 14* 13*  ALT 9 10  ALKPHOS 90 85  BILITOT 0.2* 0.4   PT/INR No results for input(s): LABPROT, INR in the last 72 hours. Hepatitis Panel No results for input(s): HEPBSAG, HCVAB, HEPAIGM, HEPBIGM in the last 72 hours.  Studies/Results: No results found.   Scheduled Meds: . amLODipine  10 mg Oral Daily  . atorvastatin  40 mg Oral QPM  . carvedilol  3.125 mg Oral BID WC  . DULoxetine  60 mg Oral Daily  . enoxaparin (LOVENOX) injection  30 mg Subcutaneous Q24H  . feeding supplement (GLUCERNA SHAKE)  237 mL Oral TID BM  . insulin aspart  0-9 Units Subcutaneous Q4H  . multivitamin with minerals  1 tablet  Oral Daily  . ondansetron (ZOFRAN) IV  4 mg Intravenous TID AC  . sodium chloride flush  3 mL Intravenous Q12H   Continuous Infusions: . sodium chloride Stopped (05/28/20 2248)  . sodium chloride     PRN Meds:.sodium chloride, acetaminophen **OR** acetaminophen, hydrALAZINE, loperamide, promethazine   ASSESMENT:   *  N/V but no abdpain for many weeks.   CTAP w/o contrast: sigmoid diverticulosis w/o complication.  Hepatomegaly.  Cystic changes in bil LL lungs.  Clinical picture is not c/w diverticulitis.   H pylori breath test positive.  Holding abx for now given likely would not tolerated po abx.    CT head: minor small vessel ischemia.  9/17 EGD: non-obsrtucting large distal esoph ring, O/w normal study.  For ? Diverticulitis, day 7 Rocephin.  Completed 3 d flagyl on 9/17.   WBCs 12.8 >> 14.9 in last 24 hours.Marland Kitchen    sxs improved on scheduled, low dose, po Reglan but this on hold for GES.  .  Nuc med GES ordered but delayed for 2 d due to receiving Protonix.  Will be done tmrw.  Same restrictions apply to Reglan as to PPI.    *    [place] anemia.  Stable Low  iron, low tibc.  Sats/ferritin/B12/folate ok.    *    Hypokalemia.  Persists.  *     AKI.  Some but not dramatic improvement.  Stable GFR c/w stage 4 CKD.       *     DM, not well controlled.  Now on insulin.         PLAN   *   nuc med study tmrw.  Hold Reglan and Protonix.      Tina Gomez  05/30/2020, 8:17 AM Phone [phone]

## 2020-05-30 NOTE — Progress Notes (Signed)
PROGRESS NOTE    Tina Gomez  WNI:[id number] DOB: [birth year] DOA: 05/24/2020 PCP: Charlott Rakes, MD  Brief Narrative: 64 year old female with history of COPD, tobacco abuse, CVA with right hemiplegia, type 2 diabetes mellitus, hypertension sent to the ED by PCP due to acute kidney injury along with ongoing nausea, vomiting, very poor oral intake for 3 to 6 months.  Vomiting worse in the morning, also reports diarrhea for the last several weeks, denies any abdominal pain.  Also history of 40 pound weight loss -Work-up in the ED noted acute kidney injury of creatinine greater than 4, severe hypokalemia, hypocalcemia, hypomagnesemia, albumin of 1.9, normal LFTs, hemoglobin of 9.8. -CT abdomen pelvis without contrast noted sigmoid diverticulitis, emphysema and hepatomegaly. -Hydrated with fluid, started on empiric antibiotics, underwent endoscopy which was largely unremarkable except for distal esophageal ring which was nonobstructive -Work-up ongoing, slowly improving with supportive care  Assessment & Plan:   Chronic Nausea vomiting  recent diarrhea 40 pound weight loss -Symptoms ongoing for about 3 months, CT abdomen pelvis non contrast raised concern for noncomplicated sigmoid diverticulitis, however exam not consistent with this -On empiric IV ceftriaxone and Flagyl,  day 7of ceftriaxone, stop antibiotics  -Endoscopy 9/18 was unremarkable except for distal esophageal ring -Gastroenterology following -Some improvement noted clinically on scheduled Reglan, now on hold for gastric emptying scan, was supposed to happen 9/20 but postponed till tomorrow as she was receiving IV Protonix -Continue Phenergan as needed -Supportive care  Acute kidney injury on chronic kidney disease stage IIIa -Baseline creatinine was 1.79 in 11/2018, this was greater than 4 on admission  -Suspect has ATN now , from prerenal etiology with ongoing GI losses, worsened by HCTZ and lisinopril use -ACE inhibitor on  hold, CT without hydronephrosis -Creatinine has plateaued at 3, expect this to improve once ATN resolves -Stop fluids after few hours today  Hepatomegaly Hypoalbuminemia -Suspect she may have NASH, denies EtOH abuse -Hep C antibody negative  Severe hypokalemia Hypomagnesemia -Due to GI losses, replaced  Iron deficiency anemia -plan for outpatient colonoscopy -hb stable now  History of CVA  Tobacco abuse COPD  Diabetes mellitus -Hold glipizide, CBGs are stable, continue sliding scale insulin -Hemoglobin A1c is 7.9  Hypertension -Continue Coreg, hold lisinopril  Severe protein calorie malnutrition -40 pound weight loss, albumin of 1.9 -RD consult  DVT prophylaxis: Lovenox  Code Status: Full code Family Communication: no family at bedside Disposition Plan:  Status is: Inpatient  Remains inpatient appropriate because:Inpatient level of care appropriate due to severity of illness   Dispo: The patient is from: Home              Anticipated d/c is to: Home              Anticipated d/c date is: Likely 48 hours              Patient currently is not medically stable to d/c.  Consultants:   Gastroenterology   Procedures: EGD 9/18-large nonobstructing distal esophageal ring otherwise normal  Antimicrobials:    Subjective: -Mild nausea this morning but otherwise overall feeling okay  Objective: Vitals:   05/29/20 1135 05/29/20 2012 05/30/20 0336 05/30/20 0343  BP: (!) 169/81 (!) 162/65  (!) 169/69  Pulse: 68 68  73  Resp: 18   15  Temp: 97.7 F (36.5 C) 97.8 F (36.6 C)  97.7 F (36.5 C)  TempSrc: Oral Oral  Oral  SpO2: 93% 92%  93%  Weight:   90.5 kg  Height:        Intake/Output Summary (Last 24 hours) at 05/30/2020 1013 Last data filed at 05/30/2020 0300 Gross per 24 hour  Intake 1506.38 ml  Output 400 ml  Net 1106.38 ml   Filed Weights   05/28/20 0448 05/29/20 0405 05/30/20 0336  Weight: 90.9 kg 90.9 kg 90.5 kg    Examination:  General  exam: Pleasant obese female sitting up in bed, mildly uncomfortable appearing, AAOx3 HEENT: No JVD CVS: S1-S2, regular rate rhythm Lungs: Poor air movement bilaterally Abdomen: Soft, nontender, bowel sounds present Extremities: No edema Psychiatry:  Mood & affect appropriate.     Data Reviewed:   CBC: Recent Labs  Lab 05/25/20 0245 05/25/20 1053 05/26/20 0321 05/26/20 0321 05/26/20 1121 05/27/20 0615 05/28/20 1217 05/29/20 0419 05/30/20 0345  WBC 13.5*  --  13.0*  --   --  12.5* 14.2* 12.8* 14.9*  NEUTROABS 10.8*  --   --   --   --   --   --   --   --   HGB 9.8*   < > 10.0*   < > 10.2* 10.2* 10.7* 10.9* 10.8*  HCT 31.5*   < > 31.6*   < > 30.0* 33.6* 35.1* 35.0* 35.1*  MCV 90.3  --  88.0  --   --  90.3 89.5 90.0 89.1  PLT 377  --  407*  --   --  408* 472* 456* 452*   < > = values in this interval not displayed.   Basic Metabolic Panel: Recent Labs  Lab 05/24/20 1326 05/24/20 1400 05/25/20 0032 05/25/20 0107 05/25/20 0245 05/25/20 1042 05/26/20 0321 05/26/20 0321 05/26/20 1121 05/27/20 0615 05/28/20 1217 05/29/20 0419 05/30/20 0345  NA   < >  --  143   < > 141   < > 143   < > 145 142 142 143 142  K   < >  --  2.3*   < > 2.4*   < > 2.9*   < > 3.5 3.5 3.8 3.2* 3.2*  CL   < >  --  107   < > 106   < > 111   < > 113* 114* 111 114* 112*  CO2   < >  --  24   < > 21*   < > 21*  --   --  17* 17* 20* 19*  GLUCOSE   < >  --  176*   < > 205*   < > 128*   < > 123* 133* 203* 116* 120*  BUN   < >  --  44*   < > 44*   < > 34*   < > 30* 25* 23 23 21   CREATININE   < >  --  3.56*   < > 3.47*   < > 3.25*   < > 3.30* 3.13* 3.19* 3.39* 3.35*  CALCIUM   < >  --  6.2*   < > 6.4*   < > 6.5*  --   --  7.4* 7.6* 7.6* 7.6*  MG  --  1.4*  --   --  1.9  --   --   --   --   --   --   --   --   PHOS  --   --  5.6*  --  5.2*  --   --   --   --   --   --   --   --    < > =  values in this interval not displayed.   GFR: Estimated Creatinine Clearance: 18 mL/min (A) (by C-G formula based on SCr  of 3.35 mg/dL (H)). Liver Function Tests: Recent Labs  Lab 05/25/20 0245 05/26/20 0321 05/27/20 0615 05/28/20 1217 05/29/20 0419  AST 10* 12* 11* 14* 13*  ALT 9 9 10 9 10   ALKPHOS 75 85 79 90 85  BILITOT 0.4 0.3 0.6 0.2* 0.4  PROT 5.4* 5.6* 5.7* 5.6* 5.5*  ALBUMIN 1.9* 1.9* 2.0* 2.0* 1.9*   Recent Labs  Lab 05/24/20 1326  LIPASE 26   No results for input(s): AMMONIA in the last 168 hours. Coagulation Profile: No results for input(s): INR, PROTIME in the last 168 hours. Cardiac Enzymes: Recent Labs  Lab 05/25/20 0032  CKTOTAL 69   BNP (last 3 results) No results for input(s): PROBNP in the last 8760 hours. HbA1C: No results for input(s): HGBA1C in the last 72 hours. CBG: Recent Labs  Lab 05/29/20 1605 05/29/20 2012 05/29/20 2304 05/30/20 0339 05/30/20 0817  GLUCAP 116* 108* 108* 118* 126*   Lipid Profile: No results for input(s): CHOL, HDL, LDLCALC, TRIG, CHOLHDL, LDLDIRECT in the last 72 hours. Thyroid Function Tests: No results for input(s): TSH, T4TOTAL, FREET4, T3FREE, THYROIDAB in the last 72 hours. Anemia Panel: No results for input(s): VITAMINB12, FOLATE, FERRITIN, TIBC, IRON, RETICCTPCT in the last 72 hours. Urine analysis:    Component Value Date/Time   COLORURINE YELLOW 05/24/2020 1324   APPEARANCEUR HAZY (A) 05/24/2020 1324   LABSPEC 1.012 05/24/2020 1324   PHURINE 6.0 05/24/2020 1324   GLUCOSEU 50 (A) 05/24/2020 1324   HGBUR NEGATIVE 05/24/2020 1324   BILIRUBINUR NEGATIVE 05/24/2020 1324   KETONESUR NEGATIVE 05/24/2020 1324   PROTEINUR >=300 (A) 05/24/2020 1324   UROBILINOGEN 1.0 10/22/2013 1626   NITRITE NEGATIVE 05/24/2020 1324   LEUKOCYTESUR NEGATIVE 05/24/2020 1324   Sepsis Labs: @LABRCNTIP (procalcitonin:4,lacticidven:4)  ) Recent Results (from the past 240 hour(s))  SARS Coronavirus 2 by RT PCR (hospital order, performed in [facility] hospital lab) Nasopharyngeal Nasopharyngeal Swab     Status: None   Collection Time: 05/24/20   5:00 PM   Specimen: Nasopharyngeal Swab  Result Value Ref Range Status   SARS Coronavirus 2 NEGATIVE NEGATIVE Final    Comment: (NOTE) SARS-CoV-2 target nucleic acids are NOT DETECTED.  The SARS-CoV-2 RNA is generally detectable in upper and lower respiratory specimens during the acute phase of infection. The lowest concentration of SARS-CoV-2 viral copies this assay can detect is 250 copies / mL. A negative result does not preclude SARS-CoV-2 infection and should not be used as the sole basis for treatment or other patient management decisions.  A negative result may occur with improper specimen collection / handling, submission of specimen other than nasopharyngeal swab, presence of viral mutation(s) within the areas targeted by this assay, and inadequate number of viral copies (<250 copies / mL). A negative result must be combined with clinical observations, patient history, and epidemiological information.  Fact Sheet for Patients:   StrictlyIdeas.no  Fact Sheet for Healthcare Providers: BankingDealers.co.za  This test is not yet approved or  cleared by the [place] FDA and has been authorized for detection and/or diagnosis of SARS-CoV-2 by FDA under an Emergency Use Authorization (EUA).  This EUA will remain in effect (meaning this test can be used) for the duration of the COVID-19 declaration under Section 564(b)(1) of the Act, 21 U.S.C. section 360bbb-3(b)(1), unless the authorization is terminated or revoked sooner.  Performed at [facility]  [facility], [address] [place], [place] [address]   Gastrointestinal Panel by PCR , Stool     Status: None   Collection Time: 05/25/20 12:32 AM   Specimen: Stool  Result Value Ref Range Status   Campylobacter species NOT DETECTED NOT DETECTED Final   Plesimonas shigelloides NOT DETECTED NOT DETECTED Final   Salmonella species NOT DETECTED NOT DETECTED Final   Yersinia  enterocolitica NOT DETECTED NOT DETECTED Final   Vibrio species NOT DETECTED NOT DETECTED Final   Vibrio cholerae NOT DETECTED NOT DETECTED Final   Enteroaggregative E coli (EAEC) NOT DETECTED NOT DETECTED Final   Enteropathogenic E coli (EPEC) NOT DETECTED NOT DETECTED Final   Enterotoxigenic E coli (ETEC) NOT DETECTED NOT DETECTED Final   Shiga like toxin producing E coli (STEC) NOT DETECTED NOT DETECTED Final   Shigella/Enteroinvasive E coli (EIEC) NOT DETECTED NOT DETECTED Final   Cryptosporidium NOT DETECTED NOT DETECTED Final   Cyclospora cayetanensis NOT DETECTED NOT DETECTED Final   Entamoeba histolytica NOT DETECTED NOT DETECTED Final   Giardia lamblia NOT DETECTED NOT DETECTED Final   Adenovirus F40/41 NOT DETECTED NOT DETECTED Final   Astrovirus NOT DETECTED NOT DETECTED Final   Norovirus GI/GII NOT DETECTED NOT DETECTED Final   Rotavirus A NOT DETECTED NOT DETECTED Final   Sapovirus (I, II, IV, and V) NOT DETECTED NOT DETECTED Final    Comment: Performed at [facility], [address] [place], [place] [address]  C Difficile Quick Screen w PCR reflex     Status: None   Collection Time: 05/25/20 12:32 AM   Specimen: Stool  Result Value Ref Range Status   C Diff antigen NEGATIVE NEGATIVE Final   C Diff toxin NEGATIVE NEGATIVE Final   C Diff interpretation No C. difficile detected.  Final    Comment: Performed at [facility], [place] [address], [place], [place] [address]    Scheduled Meds: . amLODipine  10 mg Oral Daily  . atorvastatin  40 mg Oral QPM  . carvedilol  3.125 mg Oral BID WC  . DULoxetine  60 mg Oral Daily  . enoxaparin (LOVENOX) injection  30 mg Subcutaneous Q24H  . feeding supplement (GLUCERNA SHAKE)  237 mL Oral TID BM  . insulin aspart  0-9 Units Subcutaneous Q4H  . multivitamin with minerals  1 tablet Oral Daily  . ondansetron (ZOFRAN) IV  4 mg Intravenous TID AC  . sodium chloride flush  3 mL Intravenous Q12H   Continuous Infusions: .  sodium chloride Stopped (05/28/20 2248)  . sodium chloride       LOS: 6 days   Time spent: 56min  Domenic Polite, MD [facility] 05/30/2020, 10:13 AM

## 2020-05-31 ENCOUNTER — Telehealth: Payer: Self-pay

## 2020-05-31 ENCOUNTER — Inpatient Hospital Stay (HOSPITAL_COMMUNITY): Payer: Medicaid Other

## 2020-05-31 DIAGNOSIS — K3184 Gastroparesis: Secondary | ICD-10-CM

## 2020-05-31 LAB — BASIC METABOLIC PANEL
Anion gap: 16 — ABNORMAL HIGH (ref 5–15)
BUN: 24 mg/dL — ABNORMAL HIGH (ref 8–23)
CO2: 16 mmol/L — ABNORMAL LOW (ref 22–32)
Calcium: 7.7 mg/dL — ABNORMAL LOW (ref 8.9–10.3)
Chloride: 108 mmol/L (ref 98–111)
Creatinine, Ser: 3.5 mg/dL — ABNORMAL HIGH (ref 0.44–1.00)
GFR calc Af Amer: 15 mL/min — ABNORMAL LOW (ref >60)
GFR calc non Af Amer: 13 mL/min — ABNORMAL LOW (ref >60)
Glucose, Bld: 112 mg/dL — ABNORMAL HIGH (ref 70–99)
Potassium: 3.5 mmol/L (ref 3.5–5.1)
Sodium: 140 mmol/L (ref 135–145)

## 2020-05-31 LAB — GLUCOSE, CAPILLARY
Glucose-Capillary: 100 mg/dL — ABNORMAL HIGH (ref 70–99)
Glucose-Capillary: 101 mg/dL — ABNORMAL HIGH (ref 70–99)
Glucose-Capillary: 103 mg/dL — ABNORMAL HIGH (ref 70–99)
Glucose-Capillary: 104 mg/dL — ABNORMAL HIGH (ref 70–99)
Glucose-Capillary: 112 mg/dL — ABNORMAL HIGH (ref 70–99)
Glucose-Capillary: 95 mg/dL (ref 70–99)

## 2020-05-31 LAB — CBC
HCT: 34.6 % — ABNORMAL LOW (ref 36.0–46.0)
Hemoglobin: 10.6 g/dL — ABNORMAL LOW (ref 12.0–15.0)
MCH: 27.5 pg (ref 26.0–34.0)
MCHC: 30.6 g/dL (ref 30.0–36.0)
MCV: 89.6 fL (ref 80.0–100.0)
Platelets: 463 10*3/uL — ABNORMAL HIGH (ref 150–400)
RBC: 3.86 MIL/uL — ABNORMAL LOW (ref 3.87–5.11)
RDW: 14.3 % (ref 11.5–15.5)
WBC: 15.1 10*3/uL — ABNORMAL HIGH (ref 4.0–10.5)
nRBC: 0 % (ref 0.0–0.2)

## 2020-05-31 MED ORDER — SODIUM CHLORIDE 0.9 % IV SOLN: INTRAVENOUS | Status: DC

## 2020-05-31 MED ORDER — METOCLOPRAMIDE HCL 5 MG/ML IJ SOLN
5.0000 mg | Freq: Four times a day (QID) | INTRAMUSCULAR | Status: DC
  Administered 2020-05-31 – 2020-06-01 (×4): 5 mg via INTRAVENOUS
  Filled 2020-05-31 (×3): qty 2

## 2020-05-31 MED ORDER — ONDANSETRON HCL 4 MG/2ML IJ SOLN
4.0000 mg | Freq: Four times a day (QID) | INTRAMUSCULAR | Status: DC | PRN
  Administered 2020-05-31: 4 mg via INTRAVENOUS
  Filled 2020-05-31 (×2): qty 2

## 2020-05-31 MED ORDER — PANTOPRAZOLE SODIUM 40 MG IV SOLR: 40.0000 mg | Freq: Two times a day (BID) | INTRAVENOUS | Status: DC

## 2020-05-31 MED ORDER — PANTOPRAZOLE SODIUM 40 MG IV SOLR
40.0000 mg | INTRAVENOUS | Status: DC
  Administered 2020-05-31: 40 mg via INTRAVENOUS
  Filled 2020-05-31: qty 40

## 2020-05-31 MED FILL — ATORVASTATIN CALCIUM 40 MG: 40 | 30 days supply | Qty: 30 | Fill #0

## 2020-05-31 MED FILL — METFORMIN HCL 500 MG TABS: 500 | 30 days supply | Qty: 120 | Fill #0

## 2020-05-31 MED FILL — AMLODIPINE BESYLATE 10 MG T: 10 | 30 days supply | Qty: 30 | Fill #0

## 2020-05-31 MED FILL — DULoxetine HCL 60 MG CPEP: 60 | 30 days supply | Qty: 30 | Fill #0

## 2020-05-31 MED FILL — LISINOPRIL-HCTZ 20-12.5 MG: 20-12.5 | 30 days supply | Qty: 60 | Fill #0

## 2020-05-31 MED FILL — glipiZIDE 10 MG TABS: 10 | 30 days supply | Qty: 60 | Fill #0

## 2020-05-31 MED FILL — CARVEDILOL 3.125 MG TABLET: 3.125 | 30 days supply | Qty: 60 | Fill #0

## 2020-05-31 NOTE — Progress Notes (Signed)
Daily Rounding Note  05/31/2020, 1:43 PM  LOS: 7 days   SUBJECTIVE:   Chief complaint: N/V     Staff unable to complete GES.  Pt immediately vomited up the material she was given to swallow in Nuc med, so GES aborted.   Stools, soft, greenish.     OBJECTIVE:         Vital signs in last 24 hours:    Temp:  [97.7 F (36.5 C)-98.6 F (37 C)] 97.8 F (36.6 C) (09/22 0833) Pulse Rate:  [66-74] 66 (09/22 0833) Resp:  [16-18] 18 (09/22 0833) BP: (156-179)/(59-77) 163/59 (09/22 0833) SpO2:  [90 %-97 %] 92 % (09/22 0833) Weight:  [90.9 kg] 90.9 kg (09/22 0119) Last BM Date: 05/30/20 Filed Weights   05/29/20 0405 05/30/20 0336 05/31/20 0119  Weight: 90.9 kg 90.5 kg 90.9 kg   General: looks chronically unwell.  comfortable   Heart: RRR Chest: clear Abdomen: soft, NT.  Active BS  Extremities: no CCE Neuro/Psych:  Oriented x 3.  Fluid speech.  Depressed affect.     Intake/Output from previous day: 09/21 0701 - 09/22 0700 In: 684.5 [P.O.:480; I.V.:204.5] Out: 2350 [Urine:2350]  Intake/Output this shift: No intake/output data recorded.  Lab Results: Recent Labs    05/29/20 0419 05/30/20 0345  WBC 12.8* 14.9*  HGB 10.9* 10.8*  HCT 35.0* 35.1*  PLT 456* 452*   BMET Recent Labs    05/29/20 0419 05/30/20 0345  NA 143 142  K 3.2* 3.2*  CL 114* 112*  CO2 20* 19*  GLUCOSE 116* 120*  BUN 23 21  CREATININE 3.39* 3.35*  CALCIUM 7.6* 7.6*   LFT Recent Labs    05/29/20 0419  PROT 5.5*  ALBUMIN 1.9*  AST 13*  ALT 10  ALKPHOS 85  BILITOT 0.4   PT/INR No results for input(s): LABPROT, INR in the last 72 hours. Hepatitis Panel No results for input(s): HEPBSAG, HCVAB, HEPAIGM, HEPBIGM in the last 72 hours.  Studies/Results: No results found.  ASSESMENT:   *N/V but no abdpain for many weeks.  CTAP w/o contrast: sigmoid diverticulosis w/o complication. Hepatomegaly. Cystic changes in bil LL lungs.   Clinical picture is not c/w diverticulitis.  H pylori breath test positive. Holding abx for now given likely would not tolerated po abx.  CT head: minor small vessel ischemia.  9/17 EGD: non-obsrtucting large distal esoph ring, O/w normal study.  For ? Diverticulitis, day 8 Rocephin. Completed 3 d flagyl on 9/17. WBCs 12.8 >> 14.9 in last 24 hours.Marland Kitchen   sxs improved on scheduled, low dose, po Reglan but this on hold for GES.  .  Nuc med GES ordered but delayed for 2 d due to receiving Protonix.  Will be done tmrw.  Same restrictions apply to Reglan as to PPI.    * [place] anemia. Stable Low iron, low tibc. Sats/ferritin/B12/folate ok.   * Hypokalemia. Persists.  * AKI. Some but not dramatic improvement.  Stable GFR c/w stage 4 CKD.      * DM, not well controlled. Now on insulin.     PLAN   *   Resume Protonix daily and Reglan q 6, the latter helped sxs over the weekend.    *   Not able to tolerate po so colonoscopy not going to be possible to complete prep.    *   Stop Rocephin.         Tina Gomez  05/31/2020, 1:43 PM Phone  [phone]

## 2020-05-31 NOTE — Progress Notes (Addendum)
PROGRESS NOTE  Tina Gomez WSF:[id number] DOB: 12-15-1955 DOA: 05/24/2020 PCP: Charlott Rakes, MD   LOS: 7 days   Brief narrative: As per HPI,  64 year old female with history of COPD, tobacco abuse, CVA with right hemiplegia, type 2 diabetes mellitus, hypertension was sent to the ED by her primary care physician due to acute kidney injury from ongoing nausea vomiting and poor oral intake for 3 to 6 months.  Had vomiting mostly in the morning and also reported diarrhea and 40 pound weight loss.  In the ED patient was noted to have creatinine more than 4 with severe hypokalemia hypocalcemia hypomagnesemia albumin at 1.9. CT abdomen pelvis without contrast noted sigmoid diverticulitis, emphysema and hepatomegaly.  Patient was hydrated with fluid, started on empiric antibiotics, underwent endoscopy which was largely unremarkable except for distal esophageal ring which was nonobstructive.  Assessment/Plan:  Active Problems:   Essential hypertension, benign   GERD (gastroesophageal reflux disease)   Hyperlipidemia   Diverticulitis   History of stroke   Anemia   Nausea and vomiting   AKI (acute kidney injury) ([facility])   Dehydration   Hypomagnesemia   Hypokalemia   Tobacco abuse   Emphysema of lung ([facility])   Chronic nausea vomiting with weight loss and recent diarrhea. GI on board and patient is status post EGD.  Patient received Rocephin and Flagyl for 7 days.  CT scan showed noncomplicated sigmoid diverticulitis but no other findings.  EGD on 05/27/2020 showed distal esophageal ring.  Patient persists to have nausea vomiting so is waiting for gastric emptying study today.  Patient was on Reglan with mild improvement.  Acute kidney injury on chronic kidney disease stage IIIa -Patient does have baseline creatinine of 1.7 on 11/2018 was more than 4 on presentation.  Today creatinine 3.3.  Likely has mild ATN.  Hold ACE inhibitor's HCTZ.  Patient without hydronephrosis.  Off IV fluids at this  time.    Hepatomegaly/Hypoalbuminemia Possibility of  NASH, history of alcohol abuse.  Hepatitis antibody negative.  Severe hypokalemia/hypomagnesemia On presentation.  Has been replenished.  BMP pending from today.  Iron deficiency anemia Plan for outpatient colonoscopy.  GI on board.  History of CVA right-sided weakness.  Continue supportive care.  Tobacco abuse/COPD.  Currently compensated.  Diabetes mellitus type II. Latest hemoglobin A1c of 7.9.  Hold glipizide.  Continue sliding scale insulin.  Monitor Accu-Cheks.  Latest blood glucose level of 70.  Essential hypertension -Continue Coreg, hold lisinopril.  Monitor blood pressure closely.  Severe protein calorie malnutrition Dietary on board.  Addendum:  05/31/2020 6:07 PM  Unable to do nuclear scan today because immediately threw up. Rising WBC count and reactive thrombocytosis. Creatinine slightly worse. We will put the patient on gentle IV fluid hydration overnight since she has poor oral intake. Reassess after fluid need in a.m.  DVT prophylaxis: enoxaparin (LOVENOX) injection 30 mg Start: 05/26/20 1300 SCDs Start: 05/24/20 2136  Code Status: Full code  Family Communication: None  Status is: Inpatient  Remains inpatient appropriate because:IV treatments appropriate due to intensity of illness or inability to take PO and Inpatient level of care appropriate due to severity of illness   Dispo: The patient is from: Home              Anticipated d/c is to: Home              Anticipated d/c date is: 2 days              Patient currently is  not medically stable to d/c.  Consultants:  GI  Procedures:  EGD on 9/18 showed large nonobstructive distal esophageal ring.  Antibiotics:   Completed Rocephin and metronidazole for 7 days.  Subjective: Today, patient was seen and examined at bedside.  Patient is n.p.o. for gastric emptying study.  Complains of nausea and spitting up some in the  bag  Objective: Vitals:   05/31/20 0448 05/31/20 0833  BP: (!) 179/73 (!) 163/59  Pulse: 74 66  Resp: 17 18  Temp: 97.7 F (36.5 C) 97.8 F (36.6 C)  SpO2: 97% 92%    Intake/Output Summary (Last 24 hours) at 05/31/2020 1313 Last data filed at 05/31/2020 0653 Gross per 24 hour  Intake 564.5 ml  Output 2350 ml  Net -1785.5 ml   Filed Weights   05/29/20 0405 05/30/20 0336 05/31/20 0119  Weight: 90.9 kg 90.5 kg 90.9 kg   Body mass index is 36.65 kg/m.   Physical Exam:  GENERAL: Patient is alert awake and oriented. Not in obvious distress.  Obese, lying in bed. HENT: No scleral pallor or icterus. Pupils equally reactive to light. Oral mucosa is dry. NECK: is supple, no gross swelling noted. CHEST: Clear to auscultation. No crackles or wheezes.  Diminished breath sounds bilaterally. CVS: S1 and S2 heard, no murmur. Regular rate and rhythm.  ABDOMEN: Soft, non-tender, bowel sounds are present. EXTREMITIES: No edema. CNS: Mild right-sided weakness. SKIN: warm and dry without rashes.  Data Review: I have personally reviewed the following laboratory data and studies,  CBC: Recent Labs  Lab 05/25/20 0245 05/25/20 1053 05/26/20 0321 05/26/20 0321 05/26/20 1121 05/27/20 0615 05/28/20 1217 05/29/20 0419 05/30/20 0345  WBC 13.5*  --  13.0*  --   --  12.5* 14.2* 12.8* 14.9*  NEUTROABS 10.8*  --   --   --   --   --   --   --   --   HGB 9.8*   < > 10.0*   < > 10.2* 10.2* 10.7* 10.9* 10.8*  HCT 31.5*   < > 31.6*   < > 30.0* 33.6* 35.1* 35.0* 35.1*  MCV 90.3  --  88.0  --   --  90.3 89.5 90.0 89.1  PLT 377  --  407*  --   --  408* 472* 456* 452*   < > = values in this interval not displayed.   Basic Metabolic Panel: Recent Labs  Lab 05/24/20 1326 05/24/20 1400 05/25/20 0032 05/25/20 0107 05/25/20 0245 05/25/20 1042 05/26/20 0321 05/26/20 0321 05/26/20 1121 05/27/20 0615 05/28/20 1217 05/29/20 0419 05/30/20 0345  NA   < >  --  143   < > 141   < > 143   < > 145  142 142 143 142  K   < >  --  2.3*   < > 2.4*   < > 2.9*   < > 3.5 3.5 3.8 3.2* 3.2*  CL   < >  --  107   < > 106   < > 111   < > 113* 114* 111 114* 112*  CO2   < >  --  24   < > 21*   < > 21*  --   --  17* 17* 20* 19*  GLUCOSE   < >  --  176*   < > 205*   < > 128*   < > 123* 133* 203* 116* 120*  BUN   < >  --  44*   < >  44*   < > 34*   < > 30* 25* 23 23 21   CREATININE   < >  --  3.56*   < > 3.47*   < > 3.25*   < > 3.30* 3.13* 3.19* 3.39* 3.35*  CALCIUM   < >  --  6.2*   < > 6.4*   < > 6.5*  --   --  7.4* 7.6* 7.6* 7.6*  MG  --  1.4*  --   --  1.9  --   --   --   --   --   --   --   --   PHOS  --   --  5.6*  --  5.2*  --   --   --   --   --   --   --   --    < > = values in this interval not displayed.   Liver Function Tests: Recent Labs  Lab 05/25/20 0245 05/26/20 0321 05/27/20 0615 05/28/20 1217 05/29/20 0419  AST 10* 12* 11* 14* 13*  ALT 9 9 10 9 10   ALKPHOS 75 85 79 90 85  BILITOT 0.4 0.3 0.6 0.2* 0.4  PROT 5.4* 5.6* 5.7* 5.6* 5.5*  ALBUMIN 1.9* 1.9* 2.0* 2.0* 1.9*   Recent Labs  Lab 05/24/20 1326  LIPASE 26   No results for input(s): AMMONIA in the last 168 hours. Cardiac Enzymes: Recent Labs  Lab 05/25/20 0032  CKTOTAL 69   BNP (last 3 results) Recent Labs    05/25/20 0032  BNP 209.4*    ProBNP (last 3 results) No results for input(s): PROBNP in the last 8760 hours.  CBG: Recent Labs  Lab 05/30/20 1742 05/30/20 2016 05/31/20 0034 05/31/20 0442 05/31/20 0830  GLUCAP 82 103* 104* 103* 112*   Recent Results (from the past 240 hour(s))  SARS Coronavirus 2 by RT PCR (hospital order, performed in [facility] hospital lab) Nasopharyngeal Nasopharyngeal Swab     Status: None   Collection Time: 05/24/20  5:00 PM   Specimen: Nasopharyngeal Swab  Result Value Ref Range Status   SARS Coronavirus 2 NEGATIVE NEGATIVE Final    Comment: (NOTE) SARS-CoV-2 target nucleic acids are NOT DETECTED.  The SARS-CoV-2 RNA is generally detectable in upper and  lower respiratory specimens during the acute phase of infection. The lowest concentration of SARS-CoV-2 viral copies this assay can detect is 250 copies / mL. A negative result does not preclude SARS-CoV-2 infection and should not be used as the sole basis for treatment or other patient management decisions.  A negative result may occur with improper specimen collection / handling, submission of specimen other than nasopharyngeal swab, presence of viral mutation(s) within the areas targeted by this assay, and inadequate number of viral copies (<250 copies / mL). A negative result must be combined with clinical observations, patient history, and epidemiological information.  Fact Sheet for Patients:   StrictlyIdeas.no  Fact Sheet for Healthcare Providers: BankingDealers.co.za  This test is not yet approved or  cleared by the [place] FDA and has been authorized for detection and/or diagnosis of SARS-CoV-2 by FDA under an Emergency Use Authorization (EUA).  This EUA will remain in effect (meaning this test can be used) for the duration of the COVID-19 declaration under Section 564(b)(1) of the Act, 21 U.S.C. section 360bbb-3(b)(1), unless the authorization is terminated or revoked sooner.  Performed at [facility], [place] [address], [place], [place] [address]   Gastrointestinal Panel  by PCR , Stool     Status: None   Collection Time: 05/25/20 12:32 AM   Specimen: Stool  Result Value Ref Range Status   Campylobacter species NOT DETECTED NOT DETECTED Final   Plesimonas shigelloides NOT DETECTED NOT DETECTED Final   Salmonella species NOT DETECTED NOT DETECTED Final   Yersinia enterocolitica NOT DETECTED NOT DETECTED Final   Vibrio species NOT DETECTED NOT DETECTED Final   Vibrio cholerae NOT DETECTED NOT DETECTED Final   Enteroaggregative E coli (EAEC) NOT DETECTED NOT DETECTED Final   Enteropathogenic E coli (EPEC) NOT  DETECTED NOT DETECTED Final   Enterotoxigenic E coli (ETEC) NOT DETECTED NOT DETECTED Final   Shiga like toxin producing E coli (STEC) NOT DETECTED NOT DETECTED Final   Shigella/Enteroinvasive E coli (EIEC) NOT DETECTED NOT DETECTED Final   Cryptosporidium NOT DETECTED NOT DETECTED Final   Cyclospora cayetanensis NOT DETECTED NOT DETECTED Final   Entamoeba histolytica NOT DETECTED NOT DETECTED Final   Giardia lamblia NOT DETECTED NOT DETECTED Final   Adenovirus F40/41 NOT DETECTED NOT DETECTED Final   Astrovirus NOT DETECTED NOT DETECTED Final   Norovirus GI/GII NOT DETECTED NOT DETECTED Final   Rotavirus A NOT DETECTED NOT DETECTED Final   Sapovirus (I, II, IV, and V) NOT DETECTED NOT DETECTED Final    Comment: Performed at [facility], [address] [place], [place] [address]  C Difficile Quick Screen w PCR reflex     Status: None   Collection Time: 05/25/20 12:32 AM   Specimen: Stool  Result Value Ref Range Status   C Diff antigen NEGATIVE NEGATIVE Final   C Diff toxin NEGATIVE NEGATIVE Final   C Diff interpretation No C. difficile detected.  Final    Comment: Performed at [facility], [place] [address], [place], [place] [address]     Studies: No results found.    Flora Lipps, MD  [facility] 05/31/2020

## 2020-05-31 NOTE — Progress Notes (Signed)
Patient refused her meds reports n/v despite receiving phenergan 1 hour ago,writer beeped MD check with him.

## 2020-05-31 NOTE — Progress Notes (Signed)
MD messaged to alert gastric emptying unable be comepleted due to pt having n/v,asked MD talk with daughter for update. PRN Zofran given. Messaged MD ask if she could have reglan daughter seemed to think this work great for her prior.

## 2020-05-31 NOTE — Telephone Encounter (Signed)
Pt already scheduled to see Tye Savoy NP 06/28/20@9am .

## 2020-05-31 NOTE — Telephone Encounter (Signed)
-----   Message from Mauri Pole, MD sent at 05/28/2020 12:11 PM EDT ----- Patient need follow up visit next available with APP or Dr Hilarie Fredrickson, will need to schedule colonoscopy during the office visit in 4-6 weeks.  Thanks VN

## 2020-06-01 ENCOUNTER — Ambulatory Visit: Payer: Self-pay

## 2020-06-01 DIAGNOSIS — K5792 Diverticulitis of intestine, part unspecified, without perforation or abscess without bleeding: Secondary | ICD-10-CM

## 2020-06-01 DIAGNOSIS — K219 Gastro-esophageal reflux disease without esophagitis: Secondary | ICD-10-CM

## 2020-06-01 DIAGNOSIS — D649 Anemia, unspecified: Secondary | ICD-10-CM

## 2020-06-01 DIAGNOSIS — J439 Emphysema, unspecified: Secondary | ICD-10-CM

## 2020-06-01 DIAGNOSIS — E785 Hyperlipidemia, unspecified: Secondary | ICD-10-CM

## 2020-06-01 DIAGNOSIS — E1143 Type 2 diabetes mellitus with diabetic autonomic (poly)neuropathy: Secondary | ICD-10-CM

## 2020-06-01 DIAGNOSIS — N179 Acute kidney failure, unspecified: Secondary | ICD-10-CM

## 2020-06-01 DIAGNOSIS — E860 Dehydration: Secondary | ICD-10-CM

## 2020-06-01 DIAGNOSIS — I10 Essential (primary) hypertension: Secondary | ICD-10-CM

## 2020-06-01 DIAGNOSIS — R112 Nausea with vomiting, unspecified: Secondary | ICD-10-CM

## 2020-06-01 DIAGNOSIS — E876 Hypokalemia: Secondary | ICD-10-CM

## 2020-06-01 DIAGNOSIS — K3184 Gastroparesis: Principal | ICD-10-CM

## 2020-06-01 LAB — CBC WITH DIFFERENTIAL/PLATELET
Abs Immature Granulocytes: 0.24 10*3/uL — ABNORMAL HIGH (ref 0.00–0.07)
Basophils Absolute: 0.1 10*3/uL (ref 0.0–0.1)
Basophils Relative: 0 %
Eosinophils Absolute: 0.1 10*3/uL (ref 0.0–0.5)
Eosinophils Relative: 1 %
HCT: 33.9 % — ABNORMAL LOW (ref 36.0–46.0)
Hemoglobin: 10.8 g/dL — ABNORMAL LOW (ref 12.0–15.0)
Immature Granulocytes: 2 %
Lymphocytes Relative: 13 %
Lymphs Abs: 1.8 10*3/uL (ref 0.7–4.0)
MCH: 28.3 pg (ref 26.0–34.0)
MCHC: 31.9 g/dL (ref 30.0–36.0)
MCV: 89 fL (ref 80.0–100.0)
Monocytes Absolute: 0.9 10*3/uL (ref 0.1–1.0)
Monocytes Relative: 6 %
Neutro Abs: 10.6 10*3/uL — ABNORMAL HIGH (ref 1.7–7.7)
Neutrophils Relative %: 78 %
Platelets: 479 10*3/uL — ABNORMAL HIGH (ref 150–400)
RBC: 3.81 MIL/uL — ABNORMAL LOW (ref 3.87–5.11)
RDW: 14.4 % (ref 11.5–15.5)
WBC: 13.7 10*3/uL — ABNORMAL HIGH (ref 4.0–10.5)
nRBC: 0 % (ref 0.0–0.2)

## 2020-06-01 LAB — BASIC METABOLIC PANEL
Anion gap: 10 (ref 5–15)
BUN: 23 mg/dL (ref 8–23)
CO2: 19 mmol/L — ABNORMAL LOW (ref 22–32)
Calcium: 7.6 mg/dL — ABNORMAL LOW (ref 8.9–10.3)
Chloride: 111 mmol/L (ref 98–111)
Creatinine, Ser: 3.43 mg/dL — ABNORMAL HIGH (ref 0.44–1.00)
GFR calc Af Amer: 16 mL/min — ABNORMAL LOW (ref >60)
GFR calc non Af Amer: 13 mL/min — ABNORMAL LOW (ref >60)
Glucose, Bld: 138 mg/dL — ABNORMAL HIGH (ref 70–99)
Potassium: 3.2 mmol/L — ABNORMAL LOW (ref 3.5–5.1)
Sodium: 140 mmol/L (ref 135–145)

## 2020-06-01 LAB — GLUCOSE, CAPILLARY
Glucose-Capillary: 99 mg/dL (ref 70–99)
Glucose-Capillary: 105 mg/dL — ABNORMAL HIGH (ref 70–99)
Glucose-Capillary: 127 mg/dL — ABNORMAL HIGH (ref 70–99)

## 2020-06-01 LAB — MAGNESIUM: Magnesium: 1.8 mg/dL (ref 1.7–2.4)

## 2020-06-01 MED ORDER — METOCLOPRAMIDE HCL 5 MG PO TABS
5.0000 mg | ORAL_TABLET | Freq: Three times a day (TID) | ORAL | Status: DC
Start: 2020-06-01 — End: 2020-06-01

## 2020-06-01 MED ORDER — METOCLOPRAMIDE HCL 5 MG PO TABS
5.0000 mg | ORAL_TABLET | Freq: Three times a day (TID) | ORAL | 0 refills | Status: DC
Start: 2020-06-01 — End: 2020-06-28

## 2020-06-01 MED ORDER — FLUCONAZOLE 150 MG PO TABS
150.0000 mg | ORAL_TABLET | Freq: Once | ORAL | Status: AC
  Administered 2020-06-01: 150 mg via ORAL
  Filled 2020-06-01: qty 1

## 2020-06-01 MED ORDER — METOCLOPRAMIDE HCL 5 MG PO TABS
5.0000 mg | ORAL_TABLET | Freq: Three times a day (TID) | ORAL | Status: DC
  Administered 2020-06-01: 5 mg via ORAL
  Filled 2020-06-01: qty 1

## 2020-06-01 MED FILL — METOCLOPRAMIDE 5 MG TABLET: 5 | 30 days supply | Qty: 120 | Fill #0

## 2020-06-01 NOTE — Plan of Care (Signed)

## 2020-06-01 NOTE — Discharge Summary (Signed)
Physician Discharge Summary  Tina Gomez LYY:[id number] DOB: April 05, 1956 DOA: 05/24/2020  PCP: Charlott Rakes, MD  Admit date: 05/24/2020 Discharge date: 06/01/2020  Admitted From: Home  Discharge disposition: Home   Recommendations for Outpatient Follow-Up:   . Follow up with your primary care provider as has been scheduled . Check CBC, BMP, magnesium in the next visit . Follow-up with GI as outpatient for outpatient colonoscopy in 4 to 6 weeks.  GI team to arrange.   Discharge Diagnosis:   Principal Problem:   Nausea and vomiting Active Problems:   Essential hypertension, benign   GERD (gastroesophageal reflux disease)   Hyperlipidemia   Diverticulitis   History of stroke   Anemia   AKI (acute kidney injury) ([place])   Dehydration   Hypomagnesemia   Hypokalemia   Tobacco abuse   Emphysema of lung ([place])   Diabetic gastroparesis ([place])  Discharge Condition: Improved.  Diet recommendation: Low sodium, heart healthy.  Carbohydrate-modified.  Small frequent low-fat low fiber meals.  Wound care: None.  Code status: Full.   History of Present Illness:   64 year old female with history of COPD, tobacco abuse, CVA with right hemiplegia, type 2 diabetes mellitus, hypertension was sent to the ED by her primary care physician due to acute kidney injury from ongoing nausea vomiting and poor oral intake for 3 to 6 months.  Had vomiting mostly in the morning and also reported diarrhea and 40 pound weight loss.  In the ED, patient was noted to have creatinine more than 4 with severe hypokalemia hypocalcemia, hypomagnesemia, albumin at 1.9. CT abdomen pelvis without contrast noted sigmoid diverticulitis, emphysema and hepatomegaly.  Patient was hydrated with fluid, started on empiric antibiotics. Patient underwent endoscopy which was largely unremarkable except for distal esophageal ring which was nonobstructive.  Hospital Course:   Following conditions were addressed during  hospitalization as listed below,  Chronic nausea vomiting with weight loss and recent diarrhea. Patient was seen by GI during hospitalization and received a course of antibiotic with Rocephin and metronidazole for possible diverticulitis for 7 days..  Patient also underwent endoscopy evaluation with findings of distal esophageal ring.  CT scan showed evidence of sigmoid diverticulitis but no other findings.    Patient persisted to have nausea vomiting so  gastric emptying study was attempted but due to patient's vomiting and intolerance could not be completed.  At this time patient has been recommended Reglan which has improved her symptoms.  She has tolerated solid foods and feels much better.  GI has recommended continuation of Reglan 4 times a day as outpatient.  Patient will need to follow-up with GI as outpatient for outpatient colonoscopy for iron deficiency anemia and follow-up for nausea vomiting.  Patient likely has diabetic gastroparesis.  Acute kidney injury on chronic kidney disease stage IIIa -Patient does have baseline creatinine of 1.7 on 11/2018 was more than 4.0 on presentation.  Today creatinine 3.4.    Patient was thought to have mild ATN since that she was on ACE inhibitor/ hydrochlorothiazide as outpatient.  Continue to hold ACE inhibitor and HCTZ as outpatient.  She did not have hydronephrosis.  Patient was encouraged to increase her oral fluid intake.  Will need outpatient monitoring of BMP.  Does have an appointment with in the community clinic  and will need to be checked with BMP and blood pressure needs to be monitored.  Hepatomegaly/Hypoalbuminemia Possibility of NASH, history of alcohol abuse.  Hepatitis antibody negative.  Encourage oral nutrition.  Severe hypokalemia/hypomagnesemia On presentation.  Has been replenished.    Need to check BMP as outpatient.  Iron deficiency anemia Plan for outpatient colonoscopy.  GI on board.  History of CVA right-sided weakness.   Continue supportive care.  Tobacco abuse/COPD.  Currently compensated.  Diabetes mellitus type II. Latest hemoglobin A1c of 7.9.  Oral hypoglycemic agents on discharge.  Essential hypertension -Continue Coreg, amlodipine hold lisinopril on discharge.  Monitor blood pressure closely.  Disposition.  At this time, patient is stable for disposition home.  I spoke with the patient daughter on the phone and updated her about the clinical condition of the patient and plan for disposition home.  I have asked her to hold lisinopril and HCTZ for the next 5 days before resuming again.  Medical Consultants:    GI  Procedures:    Upper GI endoscopy Subjective:   Today, patient feels better.  Denies any nausea, vomiting or abdominal pain.  Has tolerated p.o. diet.  Discharge Exam:   Vitals:   06/01/20 0851 06/01/20 0851  BP:  (!) 171/62  Pulse:  70  Resp:    Temp: 97.6 F (36.4 C)   SpO2:  93%   Vitals:   05/31/20 1632 06/01/20 0344 06/01/20 0851 06/01/20 0851  BP: (!) 164/64 (!) 159/64  (!) 171/62  Pulse: 69 67  70  Resp: 18 18    Temp: 98 F (36.7 C) 98 F (36.7 C) 97.6 F (36.4 C)   TempSrc: Oral Oral Oral   SpO2: 96% 92%  93%  Weight:  91 kg    Height:       General: Alert awake, not in obvious distress, obese HENT: pupils equally reacting to light,  No scleral pallor or icterus noted. Oral mucosa is moist.  Chest:  Clear breath sounds.  Diminished breath sounds bilaterally. No crackles or wheezes.  CVS: S1 &S2 heard. No murmur.  Regular rate and rhythm. Abdomen: Soft, nontender, nondistended.  Bowel sounds are heard.   Extremities: No cyanosis, clubbing or edema.  Peripheral pulses are palpable.  Right lower extremity mild weakness. Psych: Alert, awake and oriented, normal mood CNS:  No cranial nerve deficits.  Right lower extremity mild weakness. Skin: Warm and dry.  No rashes noted.  The results of significant diagnostics from this hospitalization (including  imaging, microbiology, ancillary and laboratory) are listed below for reference.     Diagnostic Studies:   CT Abdomen Pelvis Wo Contrast  Result Date: 05/24/2020 CLINICAL DATA:  Nausea, vomiting, greater than 40 pound weight loss, acute renal injury. EXAM: CT ABDOMEN AND PELVIS WITHOUT CONTRAST TECHNIQUE: Multidetector CT imaging of the abdomen and pelvis was performed following the standard protocol without IV contrast. COMPARISON:  None. FINDINGS: Lower chest: Lingular linear atelectasis. Paraseptal cystic changes of bilateral lower lobes. At least mild 2 vessel coronary artery calcifications. Hepatobiliary: The liver is enlarged measuring up to 20 cm. No focal liver abnormality is seen. No gallstones, gallbladder wall thickening, or biliary dilatation. Pancreas: Unremarkable. No pancreatic ductal dilatation or surrounding inflammatory changes. Spleen: Normal in size without focal abnormality. Adrenals/Urinary Tract: No adrenal nodule bilaterally. Bilateral kidneys enhance symmetrically. No hydronephrosis. No hydroureter. The urinary bladder is unremarkable. Stomach/Bowel: Stomach is within normal limits. Appendix appears normal. No evidence of small bowel wall thickening, distention, or inflammatory changes. There is mid sigmoid wall thickening with associated perisigmoid fat stranding and suggestion of been inflated diverticula open (7:91, 3:67). Scattered colonic diverticulosis that is more prominent within the sigmoid colon. The rectum is underdistended with suggestion of  mid rectal wall thickening. No bowel obstruction. Vascular/Lymphatic: No abdominal aorta or iliac aneurysm. Moderate to severe atherosclerotic plaque of the aorta and its branches. No abdominal, pelvic, or inguinal lymphadenopathy. Reproductive: Uterus and bilateral adnexa are unremarkable. Other: No intraperitoneal free fluid. No intraperitoneal free gas. No organized fluid collection. Musculoskeletal: No abdominal wall hernia or  abnormality No suspicious lytic or blastic osseous lesions. No acute displaced fracture. Multilevel degenerative changes of the spine. Degenerative changes of the right hip. IMPRESSION: 1. Uncomplicated sigmoid diverticulosis with acute diverticulitis. No associated bowel perforation or abscess formation. Post antibiotic treatment and after resolution of inflammatory changes, recommend colonoscopy to evaluate for underlying malignancy given patient's symptoms. 2. Other imaging findings of potential clinical significance: Paraseptal cystic changes of bilateral lower lobes which may represent emphysema versus pulmonary fibrosis. Hepatomegaly. Electronically Signed   By: Iven Finn M.D.   On: 05/24/2020 18:49   DG Chest 2 View  Result Date: 05/24/2020 CLINICAL DATA:  Hypertension, emesis, loss of appetite with nausea EXAM: CHEST - 2 VIEW COMPARISON:  CT 04/29/2009, radiograph 10/22/2013 FINDINGS: Chronically coarsened interstitial changes and subpleural reticular opacities compatible with the extensive paraseptal and centrilobular emphysematous changes seen on comparison CT. These findings are overall quite similar to the comparison radiograph in 2015 with some persistent mild hyperinflation and increased AP diameter of the chest. No focal consolidative opacity, convincing features of edema, pneumothorax or visible effusion. The aorta is calcified. The remaining cardiomediastinal contours are unremarkable. No acute osseous or soft tissue abnormality. Telemetry leads overlie the chest. IMPRESSION: 1. Chronic hyperinflation, coarsened interstitial changes and subpleural reticular opacities compatible with the extensive paraseptal and centrilobular emphysematous changes seen on comparison studies and not significantly changed since 2015. 2. No acute cardiopulmonary disease. 3.  Aortic Atherosclerosis (ICD10-I70.0). Electronically Signed   By: Lovena Le M.D.   On: 05/24/2020 18:05   CT Head Wo  Contrast  Result Date: 05/24/2020 CLINICAL DATA:  Nausea and vomiting EXAM: CT HEAD WITHOUT CONTRAST TECHNIQUE: Contiguous axial images were obtained from the base of the skull through the vertex without intravenous contrast. COMPARISON:  MRI 10/23/2013 FINDINGS: Brain: No acute territorial infarction, hemorrhage or intracranial mass. The ventricles are nonenlarged. Minimal white matter hypodensity likely chronic small vessel ischemic change. Stable ventricle size. Vascular: No hyperdense vessels.  Carotid vascular calcification. Skull: Normal. Negative for fracture or focal lesion. Sinuses/Orbits: No acute finding. Other: None IMPRESSION: 1. No CT evidence for acute intracranial abnormality. 2. Minimal small vessel ischemic changes of the white matter. Electronically Signed   By: Donavan Foil M.D.   On: 05/24/2020 20:45   VAS [place] LOWER EXTREMITY VENOUS (DVT)  Result Date: 05/25/2020  Lower Venous DVTStudy Indications: Edema.  Comparison Study: no prior Performing Technologist: Abram Sander RVS  Examination Guidelines: A complete evaluation includes B-mode imaging, spectral Doppler, color Doppler, and power Doppler as needed of all accessible portions of each vessel. Bilateral testing is considered an integral part of a complete examination. Limited examinations for reoccurring indications may be performed as noted. The reflux portion of the exam is performed with the patient in reverse Trendelenburg.  +---------+---------------+---------+-----------+----------+--------------+ RIGHT    CompressibilityPhasicitySpontaneityPropertiesThrombus Aging +---------+---------------+---------+-----------+----------+--------------+ CFV      Full           Yes      Yes                                 +---------+---------------+---------+-----------+----------+--------------+ SFJ  Full                                                         +---------+---------------+---------+-----------+----------+--------------+ FV Prox  Full                                                        +---------+---------------+---------+-----------+----------+--------------+ FV Mid   Full                                                        +---------+---------------+---------+-----------+----------+--------------+ FV DistalFull                                                        +---------+---------------+---------+-----------+----------+--------------+ PFV      Full                                                        +---------+---------------+---------+-----------+----------+--------------+ POP      Full           Yes      Yes                                 +---------+---------------+---------+-----------+----------+--------------+ PTV      Full                                                        +---------+---------------+---------+-----------+----------+--------------+ PERO     Full                                                        +---------+---------------+---------+-----------+----------+--------------+   +---------+---------------+---------+-----------+----------+--------------+ LEFT     CompressibilityPhasicitySpontaneityPropertiesThrombus Aging +---------+---------------+---------+-----------+----------+--------------+ CFV      Full           Yes      Yes                                 +---------+---------------+---------+-----------+----------+--------------+ SFJ      Full                                                        +---------+---------------+---------+-----------+----------+--------------+  FV Prox  Full                                                        +---------+---------------+---------+-----------+----------+--------------+ FV Mid   Full                                                         +---------+---------------+---------+-----------+----------+--------------+ FV DistalFull                                                        +---------+---------------+---------+-----------+----------+--------------+ PFV      Full                                                        +---------+---------------+---------+-----------+----------+--------------+ POP      Full           Yes      Yes                                 +---------+---------------+---------+-----------+----------+--------------+ PTV      Full                                                        +---------+---------------+---------+-----------+----------+--------------+ PERO                                                  Not visualized +---------+---------------+---------+-----------+----------+--------------+     Summary: BILATERAL: - No evidence of deep vein thrombosis seen in the lower extremities, bilaterally. - No evidence of superficial venous thrombosis in the lower extremities, bilaterally. -   *See table(s) above for measurements and observations. Electronically signed by Monica Martinez MD on 05/25/2020 at 4:15:44 PM.    Final      Labs:   Basic Metabolic Panel: Recent Labs  Lab 05/27/20 0615 05/27/20 0615 05/28/20 1217 05/28/20 1217 05/29/20 0419 05/29/20 0419 05/30/20 0345 05/31/20 1424  NA 142  --  142  --  143  --  142 140  K 3.5   < > 3.8   < > 3.2*   < > 3.2* 3.5  CL 114*  --  111  --  114*  --  112* 108  CO2 17*  --  17*  --  20*  --  19* 16*  GLUCOSE 133*  --  203*  --  116*  --  120* 112*  BUN 25*  --  23  --  23  --  21 24*  CREATININE 3.13*  --  3.19*  --  3.39*  --  3.35* 3.50*  CALCIUM 7.4*  --  7.6*  --  7.6*  --  7.6* 7.7*   < > = values in this interval not displayed.   GFR Estimated Creatinine Clearance: 17.3 mL/min (A) (by C-G formula based on SCr of 3.5 mg/dL (H)). Liver Function Tests: Recent Labs  Lab 05/26/20 0321 05/27/20 0615  05/28/20 1217 05/29/20 0419  AST 12* 11* 14* 13*  ALT 9 10 9 10   ALKPHOS 85 79 90 85  BILITOT 0.3 0.6 0.2* 0.4  PROT 5.6* 5.7* 5.6* 5.5*  ALBUMIN 1.9* 2.0* 2.0* 1.9*   No results for input(s): LIPASE, AMYLASE in the last 168 hours. No results for input(s): AMMONIA in the last 168 hours. Coagulation profile No results for input(s): INR, PROTIME in the last 168 hours.  CBC: Recent Labs  Lab 05/27/20 0615 05/28/20 1217 05/29/20 0419 05/30/20 0345 05/31/20 1424  WBC 12.5* 14.2* 12.8* 14.9* 15.1*  HGB 10.2* 10.7* 10.9* 10.8* 10.6*  HCT 33.6* 35.1* 35.0* 35.1* 34.6*  MCV 90.3 89.5 90.0 89.1 89.6  PLT 408* 472* 456* 452* 463*   Cardiac Enzymes: No results for input(s): CKTOTAL, CKMB, CKMBINDEX, TROPONINI in the last 168 hours. BNP: Invalid input(s): POCBNP CBG: Recent Labs  Lab 05/31/20 1628 05/31/20 1945 05/31/20 2353 06/01/20 0300 06/01/20 0749  GLUCAP 100* 95 101* 99 105*   D-Dimer No results for input(s): DDIMER in the last 72 hours. Hgb A1c No results for input(s): HGBA1C in the last 72 hours. Lipid Profile No results for input(s): CHOL, HDL, LDLCALC, TRIG, CHOLHDL, LDLDIRECT in the last 72 hours. Thyroid function studies No results for input(s): TSH, T4TOTAL, T3FREE, THYROIDAB in the last 72 hours.  Invalid input(s): FREET3 Anemia work up No results for input(s): VITAMINB12, FOLATE, FERRITIN, TIBC, IRON, RETICCTPCT in the last 72 hours. Microbiology Recent Results (from the past 240 hour(s))  SARS Coronavirus 2 by RT PCR (hospital order, performed in [facility] hospital lab) Nasopharyngeal Nasopharyngeal Swab     Status: None   Collection Time: 05/24/20  5:00 PM   Specimen: Nasopharyngeal Swab  Result Value Ref Range Status   SARS Coronavirus 2 NEGATIVE NEGATIVE Final    Comment: (NOTE) SARS-CoV-2 target nucleic acids are NOT DETECTED.  The SARS-CoV-2 RNA is generally detectable in upper and lower respiratory specimens during the acute phase of  infection. The lowest concentration of SARS-CoV-2 viral copies this assay can detect is 250 copies / mL. A negative result does not preclude SARS-CoV-2 infection and should not be used as the sole basis for treatment or other patient management decisions.  A negative result may occur with improper specimen collection / handling, submission of specimen other than nasopharyngeal swab, presence of viral mutation(s) within the areas targeted by this assay, and inadequate number of viral copies (<250 copies / mL). A negative result must be combined with clinical observations, patient history, and epidemiological information.  Fact Sheet for Patients:   StrictlyIdeas.no  Fact Sheet for Healthcare Providers: BankingDealers.co.za  This test is not yet approved or  cleared by the [place] FDA and has been authorized for detection and/or diagnosis of SARS-CoV-2 by FDA under an Emergency Use Authorization (EUA).  This EUA will remain in effect (meaning this test can be used) for the duration of the COVID-19 declaration under Section 564(b)(1) of the Act, 21 U.S.C. section 360bbb-3(b)(1), unless the authorization is terminated or revoked sooner.  Performed at  [facility], [place] [address], [place], [place] [address]   Gastrointestinal Panel by PCR , Stool     Status: None   Collection Time: 05/25/20 12:32 AM   Specimen: Stool  Result Value Ref Range Status   Campylobacter species NOT DETECTED NOT DETECTED Final   Plesimonas shigelloides NOT DETECTED NOT DETECTED Final   Salmonella species NOT DETECTED NOT DETECTED Final   Yersinia enterocolitica NOT DETECTED NOT DETECTED Final   Vibrio species NOT DETECTED NOT DETECTED Final   Vibrio cholerae NOT DETECTED NOT DETECTED Final   Enteroaggregative E coli (EAEC) NOT DETECTED NOT DETECTED Final   Enteropathogenic E coli (EPEC) NOT DETECTED NOT DETECTED Final   Enterotoxigenic E coli (ETEC)  NOT DETECTED NOT DETECTED Final   Shiga like toxin producing E coli (STEC) NOT DETECTED NOT DETECTED Final   Shigella/Enteroinvasive E coli (EIEC) NOT DETECTED NOT DETECTED Final   Cryptosporidium NOT DETECTED NOT DETECTED Final   Cyclospora cayetanensis NOT DETECTED NOT DETECTED Final   Entamoeba histolytica NOT DETECTED NOT DETECTED Final   Giardia lamblia NOT DETECTED NOT DETECTED Final   Adenovirus F40/41 NOT DETECTED NOT DETECTED Final   Astrovirus NOT DETECTED NOT DETECTED Final   Norovirus GI/GII NOT DETECTED NOT DETECTED Final   Rotavirus A NOT DETECTED NOT DETECTED Final   Sapovirus (I, II, IV, and V) NOT DETECTED NOT DETECTED Final    Comment: Performed at [facility], [place] [address] [place], [place] [address]  C Difficile Quick Screen w PCR reflex     Status: None   Collection Time: 05/25/20 12:32 AM   Specimen: Stool  Result Value Ref Range Status   C Diff antigen NEGATIVE NEGATIVE Final   C Diff toxin NEGATIVE NEGATIVE Final   C Diff interpretation No C. difficile detected.  Final    Comment: Performed at [facility], [place] [address] [place], [place] [address]     Discharge Instructions:   Discharge Instructions    Call MD for:  persistant nausea and vomiting   Complete by: As directed    Call MD for:  severe uncontrolled pain   Complete by: As directed    Diet - low sodium heart healthy   Complete by: As directed    Diet Carb Modified   Complete by: As directed    Discharge instructions   Complete by: As directed    Follow-up with GI as outpatient.  You will need to have colonoscopy as outpatient.  Take medications as prescribed.  If you note any abnormal movements of your body or develop new spasm ,please do not take Reglan and seek medical help.  Small frequent meals, low fat meal with low fiber recommended.  Follow-up with your primary care physician in 1 week.   Increase activity slowly   Complete by: As directed      Allergies as of  06/01/2020      Reactions   Codeine Nausea And Vomiting   Adhesive [tape] Other (See Comments)   Tape breaks out the skin if it is left on for a lengthy period of time      Medication List    STOP taking these medications   lisinopril-hydrochlorothiazide 20-12.5 MG tablet Commonly known as: Zestoretic   triamcinolone cream 0.1 % Commonly known as: KENALOG     TAKE these medications   amLODipine 10 MG tablet Commonly known as: NORVASC Take 1 tablet (10 mg total) by mouth daily.   aspirin 325 MG tablet  Take 1 tablet (325 mg total) by mouth daily.   atorvastatin 40 MG tablet Commonly known as: LIPITOR TAKE 1 TABLET BY MOUTH DAILY AT 6 PM. What changed:   how much to take  how to take this  when to take this  additional instructions   blood glucose meter kit and supplies Use as instructed   True Metrix Meter Devi 1 each by Does not apply route 3 (three) times daily before meals.   carvedilol 3.125 MG tablet Commonly known as: COREG Take 1 tablet (3.125 mg total) by mouth 2 (two) times daily with a meal.   cetirizine 10 MG tablet Commonly known as: ZYRTEC Take 1 tablet (10 mg total) by mouth daily. What changed:   when to take this  reasons to take this   DULoxetine 60 MG capsule Commonly known as: Cymbalta Take 1 capsule (60 mg total) by mouth daily.   freestyle lancets Use as instructed   TRUEplus Lancets 28G Misc 1 each by Does not apply route 3 (three) times daily before meals.   glipiZIDE 10 MG tablet Commonly known as: GLUCOTROL Take 1 tablet (10 mg total) by mouth 2 (two) times daily before a meal.   glucose blood test strip Commonly known as: TRUEtest Test Use as instructed   metFORMIN 500 MG tablet Commonly known as: GLUCOPHAGE TAKE 2 TABLETS BY MOUTH 2 TIMES DAILY WITH A MEAL. What changed:   how much to take  how to take this  when to take this  additional instructions   metoCLOPramide 5 MG tablet Commonly known as:  REGLAN Take 1 tablet (5 mg total) by mouth 4 (four) times daily -  before meals and at bedtime.   Olopatadine HCl 0.2 % Soln Commonly known as: Pataday Apply 1 drop to eye daily.   pantoprazole 40 MG tablet Commonly known as: PROTONIX Take 1 tablet (40 mg total) by mouth daily.   promethazine 25 MG suppository Commonly known as: PHENERGAN Place 1 suppository (25 mg total) rectally every 8 (eight) hours as needed for nausea or vomiting.          Follow-up [facility] Follow up.   Why: make follow up apt Contact information: [place] [phone]               Time coordinating discharge: 39 minutes  Signed:  Onyekachi Gathright  [facility] 06/01/2020, 10:33 AM

## 2020-06-01 NOTE — Progress Notes (Addendum)
Daily Rounding Note  06/01/2020, 9:26 AM  LOS: 8 days   SUBJECTIVE:   Chief complaint:  N/V, diarrhea   N/V gone w resumption IV Reglan.  Stools less urgent and less watery, able to control and prevent incontinence lookin forward to going home, has autistic son living at home that misses her.    OBJECTIVE:         Vital signs in last 24 hours:    Temp:  [97.6 F (36.4 C)-98 F (36.7 C)] 97.6 F (36.4 C) (09/23 0851) Pulse Rate:  [67-70] 70 (09/23 0851) Resp:  [18] 18 (09/23 0344) BP: (159-171)/(62-64) 171/62 (09/23 0851) SpO2:  [92 %-96 %] 93 % (09/23 0851) Weight:  [91 kg] 91 kg (09/23 0344) Last BM Date: 05/30/20 Filed Weights   05/30/20 0336 05/31/20 0119 06/01/20 0344  Weight: 90.5 kg 90.9 kg 91 kg   General: pleasant, chronically ill looking but much better than at admission   Heart: RRR Chest: clear bil.   Abdomen: soft, NT, ND.  Active BS  Extremities: no CCE Neuro/Psych:  Oriented x 3.  Fully alert.  Calm.    Intake/Output from previous day: 09/22 0701 - 09/23 0700 In: 580 [P.O.:580] Out: 351 [Urine:350; Stool:1]  Intake/Output this shift: No intake/output data recorded.  Lab Results: Recent Labs    05/30/20 0345 05/31/20 1424  WBC 14.9* 15.1*  HGB 10.8* 10.6*  HCT 35.1* 34.6*  PLT 452* 463*   BMET Recent Labs    05/30/20 0345 05/31/20 1424  NA 142 140  K 3.2* 3.5  CL 112* 108  CO2 19* 16*  GLUCOSE 120* 112*  BUN 21 24*  CREATININE 3.35* 3.50*  CALCIUM 7.6* 7.7*   Scheduled Meds: . amLODipine  10 mg Oral Daily  . atorvastatin  40 mg Oral QPM  . carvedilol  3.125 mg Oral BID WC  . DULoxetine  60 mg Oral Daily  . enoxaparin (LOVENOX) injection  30 mg Subcutaneous Q24H  . feeding supplement (GLUCERNA SHAKE)  237 mL Oral TID BM  . insulin aspart  0-9 Units Subcutaneous Q4H  . metoCLOPramide (REGLAN) injection  5 mg Intravenous Q6H  . multivitamin with minerals  1 tablet Oral  Daily  . pantoprazole (PROTONIX) IV  40 mg Intravenous Q24H  . sodium chloride flush  3 mL Intravenous Q12H   Continuous Infusions: . sodium chloride Stopped (05/28/20 2248)  . sodium chloride 75 mL/hr at 06/01/20 0856   PRN Meds:.sodium chloride, acetaminophen **OR** acetaminophen, hydrALAZINE, loperamide, ondansetron (ZOFRAN) IV   ASSESMENT:   *  3 months of bilious emesis.   9/17 EGD: 1. 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  2. Otherwise normal EGD.  3. No cause for nausea and vomiting found on upper endoscopy Unable to complete GES due to vomiting Reglan 5 mg IV q 6 hours over weekend helpful.  Restarted on 9/22.    *   Loose stools, c diff and GI path PCR negative ? Sigmoid diverticulitis, seen on CT but pt never had pain or fever.  WBCs elevted Completed 8 d Rocephin, 3 d Flagyl.  Off all abx as of 9/22.    *   AKI w what looks to be stage 4 CKD  *   DM2, poorly controlled, not seeing MD regularly PTA.  Now on insulin.    * [place] anemia. Stable Low iron, low tibc. Sats/ferritin/B12/folate ok.    PLAN   *   outpt  colonoscopy in 4 to 6 weeks, GI will arrange.  Dr Victorino Dike first.   Ok to discharge home today.    *   Change to Reglan 5mg  po AC and HS.  In future weeks could try dropping HS dose and perhaps eventually dropping dose to bid but for now stick w AC/HS dosing      Azucena Freed  06/01/2020, 9:26 AM Phone [phone]

## 2020-06-01 NOTE — Progress Notes (Signed)
D/C instructions given and reviewed. Questions answered and encouraged to call with any further concerns. Tele and IV removed, tolerated well.

## 2020-06-01 NOTE — Plan of Care (Signed)
  Problem: Education: Goal: Knowledge of General Education information will improve Description: Including pain rating scale, medication(s)/side effects and non-pharmacologic comfort measures Outcome: Progressing   Problem: Health Behavior/Discharge Planning: Goal: Ability to manage health-related needs will improve Outcome: Progressing   Problem: Clinical Measurements: Goal: Ability to maintain clinical measurements within normal limits will improve Outcome: Progressing Goal: Will remain free from infection Outcome: Progressing Goal: Diagnostic test results will improve Outcome: Progressing Goal: Respiratory complications will improve Outcome: Progressing Goal: Cardiovascular complication will be avoided Outcome: Progressing   Problem: Activity: Goal: Risk for activity intolerance will decrease Outcome: Progressing   Problem: Nutrition: Goal: Adequate nutrition will be maintained Outcome: Progressing   Problem: Elimination: Goal: Will not experience complications related to bowel motility Outcome: Progressing Goal: Will not experience complications related to urinary retention Outcome: Progressing   Problem: Elimination: Goal: Will not experience complications related to urinary retention Outcome: Progressing   Problem: Safety: Goal: Ability to remain free from injury will improve Outcome: Progressing   Problem: Skin Integrity: Goal: Risk for impaired skin integrity will decrease Outcome: Progressing

## 2020-06-02 ENCOUNTER — Telehealth: Payer: Self-pay

## 2020-06-02 MED ORDER — TRUE METRIX METER DEVI
1.0000 | Freq: Three times a day (TID) | 0 refills | Status: DC
Start: 2020-06-02 — End: 2021-11-21

## 2020-06-02 MED ORDER — TRUETEST TEST VI STRP
ORAL_STRIP | 12 refills | Status: DC
Start: 2020-06-02 — End: 2021-12-03

## 2020-06-02 MED ORDER — TRUEPLUS LANCETS 28G MISC: 1.0000 | Freq: Three times a day (TID) | 12 refills | Status: DC

## 2020-06-02 MED FILL — TRUE METRIX TEST STRIP: 25 days supply | Qty: 100 | Fill #0

## 2020-06-02 MED FILL — TRUEplus LANCETS 28G MISC: 25 days supply | Qty: 100 | Fill #0

## 2020-06-02 MED FILL — !TRUE METRIX BLOOD GLUCOSE: 1 days supply | Qty: 1 | Fill #0

## 2020-06-02 NOTE — Telephone Encounter (Addendum)
Glucometer sent to Pharmacy. Subsequently if my patients need a Glucometer please go ahead and send rx to Pharmacy. Thanks.

## 2020-06-02 NOTE — Addendum Note (Signed)
Addended byCharlott Rakes on: 06/02/2020 01:24 PM   Modules accepted: Orders

## 2020-06-02 NOTE — Telephone Encounter (Signed)
Transition Care Management Follow-up Telephone Call    Date of discharge and from where:[facility] 05/31/2020 How have you been since you were released from the hospital? Feeling Ok  Any questions or concerns? No questions/concerns reported.   PT REQUESTED A NEW GLUCOMETER PRESCRIPTION/ unable to check BG since discharged   Items Reviewed: Did the pt receive and understand the discharge instructions provided? Stated that have the instructions and have no questions.  Medications obtained and verified? She said that they have the medication list  and the hospital staff reviewed them in detail prior to discharge. She said that he has all of the medications and they have no questions.  Any new allergies since your discharge? None reported  Do you have support at home? Yes, family and daughter  Other (ie: DME, Home Health, etc)       Pt has already [facility] and walker  Functional Questionnaire: (I = Independent and D = Dependent) ADL's:  Independent.      Follow up appointments reviewed:              PCP Hospital f/u appt confirmed?06/15/2020 @ 1050. Informed they would receive notification if the appointment is in person or televisit.             [facility] f/u appt confirmed? None scheduled at this time             Are transportation arrangements needed? She  have transportation              If their condition worsens, is the pt aware to call  their PCP or go to the ED? Yes.Made pt aware if condition worsen or start experiencing rapid weight gain, chest pain, diff breathing, SOB, high fevers, or bleading to refer imediately to ED for further evaluation.             Was the patient provided with contact information for the PCP's office or ED? He has the phone number             Was the pt encouraged to call back with questions or concerns?yes

## 2020-06-02 NOTE — Telephone Encounter (Signed)
Thank you :)

## 2020-06-15 ENCOUNTER — Other Ambulatory Visit: Payer: Self-pay

## 2020-06-15 ENCOUNTER — Other Ambulatory Visit: Payer: Self-pay | Admitting: Family Medicine

## 2020-06-15 ENCOUNTER — Ambulatory Visit: Payer: Medicaid Other | Attending: Family Medicine | Admitting: Family Medicine

## 2020-06-15 ENCOUNTER — Encounter: Payer: Self-pay | Admitting: Family Medicine

## 2020-06-15 VITALS — BP 195/85 | HR 69 | Wt 199.0 lb

## 2020-06-15 DIAGNOSIS — E1143 Type 2 diabetes mellitus with diabetic autonomic (poly)neuropathy: Secondary | ICD-10-CM

## 2020-06-15 DIAGNOSIS — K3184 Gastroparesis: Secondary | ICD-10-CM

## 2020-06-15 DIAGNOSIS — B9681 Helicobacter pylori [H. pylori] as the cause of diseases classified elsewhere: Secondary | ICD-10-CM | POA: Diagnosis not present

## 2020-06-15 DIAGNOSIS — N185 Chronic kidney disease, stage 5: Secondary | ICD-10-CM | POA: Diagnosis not present

## 2020-06-15 DIAGNOSIS — E1140 Type 2 diabetes mellitus with diabetic neuropathy, unspecified: Secondary | ICD-10-CM | POA: Diagnosis not present

## 2020-06-15 DIAGNOSIS — R600 Localized edema: Secondary | ICD-10-CM | POA: Diagnosis not present

## 2020-06-15 DIAGNOSIS — K2970 Gastritis, unspecified, without bleeding: Secondary | ICD-10-CM | POA: Diagnosis not present

## 2020-06-15 DIAGNOSIS — N179 Acute kidney failure, unspecified: Secondary | ICD-10-CM | POA: Diagnosis not present

## 2020-06-15 DIAGNOSIS — I10 Essential (primary) hypertension: Secondary | ICD-10-CM

## 2020-06-15 MED ORDER — FLUCONAZOLE 150 MG PO TABS: 150.0000 mg | ORAL_TABLET | Freq: Once | ORAL | 0 refills | Status: DC

## 2020-06-15 MED ORDER — OMEPRAZOLE 20 MG PO CPDR: 20.0000 mg | DELAYED_RELEASE_CAPSULE | Freq: Two times a day (BID) | ORAL | 0 refills | Status: DC

## 2020-06-15 MED ORDER — CARVEDILOL 12.5 MG PO TABS: 12.5000 mg | ORAL_TABLET | Freq: Two times a day (BID) | ORAL | 3 refills | Status: DC

## 2020-06-15 MED ORDER — AMOXICILLIN 500 MG PO CAPS: 1000.0000 mg | ORAL_CAPSULE | Freq: Two times a day (BID) | ORAL | 0 refills | Status: DC

## 2020-06-15 MED ORDER — CLARITHROMYCIN 500 MG PO TABS: 500.0000 mg | ORAL_TABLET | Freq: Two times a day (BID) | ORAL | 0 refills | Status: DC

## 2020-06-15 MED FILL — OMEPRAZOLE 20 MG CAP: 20 | 14 days supply | Qty: 28 | Fill #0

## 2020-06-15 MED FILL — FLUCONAZOLE 150 MG TABLET: 150 | 1 days supply | Qty: 1 | Fill #0

## 2020-06-15 MED FILL — CLARITHROMYCIN 500 MG TAB: 500 | 15 days supply | Qty: 28 | Fill #0

## 2020-06-15 MED FILL — CARVEDILOL 12.5 MG TABLET: 12.5 | 30 days supply | Qty: 60 | Fill #0

## 2020-06-15 MED FILL — AMOXICILLIN 500 MG CAPSULE: 500 | 14 days supply | Qty: 56 | Fill #0

## 2020-06-15 NOTE — Patient Instructions (Signed)
-  Discontinue Amlodipine due to swelling in your legs -Increase Carvedilol to 12.5 mg twice daily -Discontinue Metformin due to abnormal kidney function -Keep a blood pressure log at home which will be reviewed at your next office visit -Keep a blood sugar log at home which will be reviewed at your next office visit -I have placed you on Amoxicillin, Clarithromycin and Protonix for H. pylori bacteria in your stomach. -Hold Atorvastatin while you are taking Clarithromycin and once completed resume Atorvastatin. -Diflucan pill prescribed prophylactically for yeast infection.

## 2020-06-15 NOTE — Progress Notes (Signed)
She having swelling in her ankles and hands. She thinks it may be due to her medications.  Wants to discuss booster vaccine.

## 2020-06-15 NOTE — Progress Notes (Signed)
Subjective:  Patient ID: Tina Gomez, female    DOB: Dec 04, 1955  Age: 64 y.o. MRN: [id number]  CC: Hospitalization Follow-up   HPI Tina Gomez  is a 64 year-old female withahistory of hypertension, hyperlipidemia, type 2 diabetes mellitus (A1c7.9), GERD, stage V chronic kidney disease seen for follow-up visit at the transitional care clinic after recent hospitalization for acute on chronic kidney disease secondary to nausea and vomiting at [facility] from 05/24/2020 through 06/01/2020. Creatinine had trended up from a baseline of 1.7 to 4.01.  She was treated with IV fluids and her antihypertensive regimen was adjusted with discontinuation of her ACE inhibitor and HCTZ. She underwent upper endoscopy which revealed: 1. 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 Otherwise normal EGD 3. No cause for nausea and vomiting found on upper endoscopy  CT abdomen and pelvis revealed diverticulitis which was treated with Rocephin and Flagyl. She was discharged with a creatinine of 3.43.  Today her blood pressure is elevated and her ACE inhibitor and HCTZ are still on hold.  She did not take her carvedilol or amlodipine as she usually takes it at 12 noon. States she cannot take the combo pill Lisinopril/HCTZ - burning and swelling of her tongue occurred in the past with this but she is able to tolerate lisinopril and HCTZ separately.  I had referred [facility] to [facility] at her last visit on 05/24/2019 and she has an appointment coming up on 06/28/20 Nauseous if she does not take Reglan on time. No vomiting occurs and she has no abdominal pain.  Of note her labs from her last visit have revealed H pylori gastritis which was not treated during her hospitalization. Pedal edema started after discharge and she is concerned about this. She is also requesting prophylactic Diflucan if she will be given an antibiotic today. Past Medical History:  Diagnosis Date   Hypertension     Stroke [facility])     Past Surgical History:  Procedure Laterality Date   CESAREAN SECTION     ESOPHAGOGASTRODUODENOSCOPY (EGD) WITH PROPOFOL N/A 05/26/2020   Procedure: ESOPHAGOGASTRODUODENOSCOPY (EGD) WITH PROPOFOL;  Surgeon: Irene Shipper, MD;  Location: [facility] ENDOSCOPY;  Service: Endoscopy;  Laterality: N/A;    Family History  Problem Relation Age of Onset   Stroke Mother    Hypertension Mother    Hyperlipidemia Mother    Hypertension Father    Hyperlipidemia Father     Allergies  Allergen Reactions   Codeine Nausea And Vomiting   Adhesive [Tape] Other (See Comments)    Tape breaks out the skin if it is left on for a lengthy period of time    Outpatient Medications Prior to Visit  Medication Sig Dispense Refill   aspirin 325 MG tablet Take 1 tablet (325 mg total) by mouth daily. 90 tablet 1   atorvastatin (LIPITOR) 40 MG tablet TAKE 1 TABLET BY MOUTH DAILY AT 6 PM. (Patient taking differently: Take 40 mg by mouth every evening. ) 90 tablet 1   Blood Glucose Monitoring Suppl (BLOOD GLUCOSE METER) kit Use as instructed 1 each 0   Blood Glucose Monitoring Suppl (TRUE METRIX METER) DEVI 1 each by Does not apply route 3 (three) times daily before meals. 1 each 0   cetirizine (ZYRTEC) 10 MG tablet Take 1 tablet (10 mg total) by mouth daily. (Patient taking differently: Take 10 mg by mouth daily as needed for allergies or rhinitis. ) 30 tablet 1   DULoxetine (CYMBALTA) 60 MG  capsule Take 1 capsule (60 mg total) by mouth daily. 90 capsule 1   glipiZIDE (GLUCOTROL) 10 MG tablet Take 1 tablet (10 mg total) by mouth 2 (two) times daily before a meal. 180 tablet 1   glucose blood (TRUETEST TEST) test strip Use as instructed 100 each 12   Lancets (FREESTYLE) lancets Use as instructed 100 each 12   metFORMIN (GLUCOPHAGE) 500 MG tablet TAKE 2 TABLETS BY MOUTH 2 TIMES DAILY WITH A MEAL. (Patient taking differently: Take 1,000 mg by mouth 2 (two) times daily with a meal. ) 360  tablet 1   metoCLOPramide (REGLAN) 5 MG tablet Take 1 tablet (5 mg total) by mouth 4 (four) times daily -  before meals and at bedtime. 120 tablet 0   promethazine (PHENERGAN) 25 MG suppository Place 1 suppository (25 mg total) rectally every 8 (eight) hours as needed for nausea or vomiting. 20 each 0   TRUEplus Lancets 28G MISC 1 each by Does not apply route 3 (three) times daily before meals. 100 each 12   amLODipine (NORVASC) 10 MG tablet Take 1 tablet (10 mg total) by mouth daily. 90 tablet 1   carvedilol (COREG) 3.125 MG tablet Take 1 tablet (3.125 mg total) by mouth 2 (two) times daily with a meal. 180 tablet 1   Olopatadine HCl (PATADAY) 0.2 % SOLN Apply 1 drop to eye daily. (Patient not taking: Reported on 05/24/2020) 2.5 mL 1   pantoprazole (PROTONIX) 40 MG tablet Take 1 tablet (40 mg total) by mouth daily. (Patient not taking: Reported on 05/24/2020) 90 tablet 1   No facility-administered medications prior to visit.     ROS Review of Systems  Constitutional: Negative for activity change, appetite change and fatigue.  HENT: Negative for congestion, sinus pressure and sore throat.   Eyes: Negative for visual disturbance.  Respiratory: Negative for cough, chest tightness, shortness of breath and wheezing.   Cardiovascular: Positive for leg swelling. Negative for chest pain and palpitations.  Gastrointestinal: Positive for nausea. Negative for abdominal distention, abdominal pain and constipation.  Endocrine: Negative for polydipsia.  Genitourinary: Negative for dysuria and frequency.  Musculoskeletal: Negative for arthralgias and back pain.  Skin: Negative for rash.  Neurological: Negative for tremors, light-headedness and numbness.  Hematological: Does not bruise/bleed easily.  Psychiatric/Behavioral: Negative for agitation and behavioral problems.    Objective:  BP (!) 195/85    Pulse 69    Wt 199 lb (90.3 kg)    SpO2 97%    BMI 36.40 kg/m   BP/Weight 06/15/2020  06/01/2020 8/75/6433  Systolic BP 295 188 416  Diastolic BP 85 73 71  Wt. (Lbs) 199 200.6 192  BMI 36.4 36.69 35.12      Physical Exam Constitutional:      Appearance: She is well-developed.  Neck:     Vascular: No JVD.  Cardiovascular:     Rate and Rhythm: Normal rate.     Heart sounds: Normal heart sounds. No murmur heard.   Pulmonary:     Effort: Pulmonary effort is normal.     Breath sounds: Normal breath sounds. No wheezing or rales.  Chest:     Chest wall: No tenderness.  Abdominal:     General: Bowel sounds are normal. There is no distension.     Palpations: Abdomen is soft. There is no mass.     Tenderness: There is no abdominal tenderness.  Musculoskeletal:        General: Normal range of motion.  Right lower leg: Edema (2+) present.     Left lower leg: Edema (2+) present.  Neurological:     Mental Status: She is alert and oriented to person, place, and time.  Psychiatric:        Mood and Affect: Mood normal.     CMP Latest Ref Rng & Units 06/01/2020 05/31/2020 05/30/2020  Glucose 70 - 99 mg/dL 138(H) 112(H) 120(H)  BUN 8 - 23 mg/dL 23 24(H) 21  Creatinine 0.44 - 1.00 mg/dL 3.43(H) 3.50(H) 3.35(H)  Sodium 135 - 145 mmol/L 140 140 142  Potassium 3.5 - 5.1 mmol/L 3.2(L) 3.5 3.2(L)  Chloride 98 - 111 mmol/L 111 108 112(H)  CO2 22 - 32 mmol/L 19(L) 16(L) 19(L)  Calcium 8.9 - 10.3 mg/dL 7.6(L) 7.7(L) 7.6(L)  Total Protein 6.5 - 8.1 g/dL - - -  Total Bilirubin 0.3 - 1.2 mg/dL - - -  Alkaline Phos 38 - 126 U/L - - -  AST 15 - 41 U/L - - -  ALT 0 - 44 U/L - - -    Lipid Panel     Component Value Date/Time   CHOL 251 (H) 11/25/2018 1506   TRIG 272 (H) 11/25/2018 1506   HDL 44 11/25/2018 1506   CHOLHDL 5.7 (H) 11/25/2018 1506   CHOLHDL 4.8 11/13/2016 1018   VLDL 40 (H) 02/15/2016 0907   LDLCALC 153 (H) 11/25/2018 1506    CBC    Component Value Date/Time   WBC 13.7 (H) 06/01/2020 1038   RBC 3.81 (L) 06/01/2020 1038   HGB 10.8 (L) 06/01/2020 1038    HCT 33.9 (L) 06/01/2020 1038   PLT 479 (H) 06/01/2020 1038   MCV 89.0 06/01/2020 1038   MCH 28.3 06/01/2020 1038   MCHC 31.9 06/01/2020 1038   RDW 14.4 06/01/2020 1038   LYMPHSABS 1.8 06/01/2020 1038   MONOABS 0.9 06/01/2020 1038   EOSABS 0.1 06/01/2020 1038   BASOSABS 0.1 06/01/2020 1038    Lab Results  Component Value Date   HGBA1C 7.9 (H) 05/25/2020    Assessment & Plan:  1. Essential hypertension, benign Uncontrolled due to not taking antihypertensives this morning Presently experiencing pedal edema likely from amlodipine-discontinue amlodipine Increase carvedilol to 12.5 twice daily Hold off on HCTZ due to renal function Given previous history of burning of the tongue and edema with lisinopril/HCTZ I am reluctant to place her back on lisinopril even though she states she is able to tolerate individual medications but not the combo. - carvedilol (COREG) 12.5 MG tablet; Take 1 tablet (12.5 mg total) by mouth 2 (two) times daily with a meal.  Dispense: 60 tablet; Refill: 3  2. Helicobacter pylori gastritis Could also explain her nausea and vomiting We will treat given she did not receive treatment during hospitalization - amoxicillin (AMOXIL) 500 MG capsule; Take 2 capsules (1,000 mg total) by mouth 2 (two) times daily.  Dispense: 56 capsule; Refill: 0 - clarithromycin (BIAXIN) 500 MG tablet; Take 1 tablet (500 mg total) by mouth 2 (two) times daily. Hold atorvastatin while on clarithromycin  Dispense: 28 tablet; Refill: 0 - omeprazole (PRILOSEC) 20 MG capsule; Take 1 capsule (20 mg total) by mouth 2 (two) times daily before a meal.  Dispense: 28 capsule; Refill: 0  3. Acute renal failure superimposed on stage 5 chronic kidney disease, not on chronic dialysis, unspecified acute renal failure type ([place]) Secondary to dehydration from nausea and vomiting We will repeat renal function Refer to nephrology Underlying hypertensive and diabetic nephropathy - Ambulatory  referral to  Nephrology - CMP14+EGFR  4. Type 2 diabetes mellitus with diabetic neuropathy, without long-term current use of insulin (HCC) Not at goal with A1c of 7.9 Currently on glipizide and Metformin Hold off on Metformin due to abnormal renal function; if repeat renal function is still low will discontinue Metformin altogether I will review her blood sugar log at next visit and determine her new regimen going forward  5. Diabetic gastroparesis ([place]) Could explain her nausea and vomiting Continue Reglan  6. Pedal edema Likely from amlodipine CKD likely playing a role as well Based on renal function will determine if diuretic is indicated    Meds ordered this encounter  Medications   fluconazole (DIFLUCAN) 150 MG tablet    Sig: Take 1 tablet (150 mg total) by mouth once for 1 dose.    Dispense:  1 tablet    Refill:  0   amoxicillin (AMOXIL) 500 MG capsule    Sig: Take 2 capsules (1,000 mg total) by mouth 2 (two) times daily.    Dispense:  56 capsule    Refill:  0   clarithromycin (BIAXIN) 500 MG tablet    Sig: Take 1 tablet (500 mg total) by mouth 2 (two) times daily. Hold atorvastatin while on clarithromycin    Dispense:  28 tablet    Refill:  0   omeprazole (PRILOSEC) 20 MG capsule    Sig: Take 1 capsule (20 mg total) by mouth 2 (two) times daily before a meal.    Dispense:  28 capsule    Refill:  0   carvedilol (COREG) 12.5 MG tablet    Sig: Take 1 tablet (12.5 mg total) by mouth 2 (two) times daily with a meal.    Dispense:  60 tablet    Refill:  3    Discontinue amlodipine    Follow-up: Return in about 3 weeks (around 07/06/2020) for Hypertension diabetes.       Charlott Rakes, MD, FAAFP. [facility] [place], [place]   06/15/2020, 1:25 PM

## 2020-06-16 ENCOUNTER — Telehealth: Payer: Self-pay

## 2020-06-16 LAB — CMP14+EGFR
ALT: 9 IU/L (ref 0–32)
AST: 11 IU/L (ref 0–40)
Albumin/Globulin Ratio: 1 — ABNORMAL LOW (ref 1.2–2.2)
Albumin: 2.9 g/dL — ABNORMAL LOW (ref 3.8–4.8)
Alkaline Phosphatase: 114 IU/L (ref 44–121)
BUN/Creatinine Ratio: 14 (ref 12–28)
BUN: 56 mg/dL — ABNORMAL HIGH (ref 8–27)
Bilirubin Total: <0.2 mg/dL (ref 0.0–1.2)
CO2: 19 mmol/L — ABNORMAL LOW (ref 20–29)
Calcium: 7.7 mg/dL — ABNORMAL LOW (ref 8.7–10.3)
Chloride: 107 mmol/L — ABNORMAL HIGH (ref 96–106)
Creatinine, Ser: 4.1 mg/dL — ABNORMAL HIGH (ref 0.57–1.00)
GFR calc Af Amer: 13 mL/min/{1.73_m2} — ABNORMAL LOW (ref >59)
GFR calc non Af Amer: 11 mL/min/{1.73_m2} — ABNORMAL LOW (ref >59)
Globulin, Total: 2.8 g/dL (ref 1.5–4.5)
Glucose: 76 mg/dL (ref 65–99)
Potassium: 4.8 mmol/L (ref 3.5–5.2)
Sodium: 143 mmol/L (ref 134–144)
Total Protein: 5.7 g/dL — ABNORMAL LOW (ref 6.0–8.5)

## 2020-06-16 NOTE — Telephone Encounter (Signed)
Patient name and DOB has been verified Patient was informed of lab results. Patient had no questions.  

## 2020-06-16 NOTE — Telephone Encounter (Signed)
-----   Message from Charlott Rakes, MD sent at 06/16/2020 11:12 AM EDT ----- Kidney function has worsened compared to discharge. Please advise to avoid NSAIDS. I had previously referred her to Nephrology and hope her appointment will be coming up soon. Advise to keep upcoming appointment with me.

## 2020-06-19 ENCOUNTER — Telehealth: Payer: Self-pay

## 2020-06-19 ENCOUNTER — Ambulatory Visit: Payer: Self-pay

## 2020-06-19 NOTE — Telephone Encounter (Signed)
Patient called to inform PCP that she has had a morning blood sugar of 50 on yesterday, she had a fasting blood sugar of 78 this morning.  Patient is still taking glipizide medication and would like some advise on what to do about her blood sugars.

## 2020-06-19 NOTE — Telephone Encounter (Signed)
  Patient called stating that she has had low blood sugars.  Yesterday her BS was 50 and she was feeling dizzy  Her daughter gave her a donut and she felt better  This am her BS 1 and she ate breakfast it went to 81.  She states that she was recently hospitalized with kidney failure and it scared her into healthy lifestyle and food choices. She has cut all sugar from her diet No sodas.  She states that she really has been feeling better.  She says she was told to stop her metformin but BS are still low.  She also takes glipizide and would like to come off of it as well to see how she does. Per protocol call was made to office. I spoke to [place].  Per [place] I instructed the patient to hold her glipizide and the office would be in touch in about 1 hour. Patient verbalized understanding of all instructions. Reason for Disposition . [1] Morning (before breakfast) blood glucose < 80 mg/dL (4.4 mmol/L) AND [2] more than once in past week  Answer Assessment - Initial Assessment Questions 1. SYMPTOMS: "What symptoms are you concerned about?"     Low BS 50 2. ONSET:  "When did the symptoms start?"     yesterday 3. BLOOD GLUCOSE: "What is your blood glucose level?"      71 this AM  After eating 81 4. USUAL RANGE: "What is your blood glucose level usually?" (e.g., usual fasting morning value, usual evening value)     High 140s 5. TYPE 1 or 2:  "Do you know what type of diabetes you have?"  (e.g., Type 1, Type 2, Gestational; doesn't know)      Type 2 6. INSULIN: "Do you take insulin?" "What type of insulin(s) do you use? What is the mode of delivery? (syringe, pen; injection or pump) "When did you last give yourself an insulin dose?" (i.e., time or hours/minutes ago) "How much did you give?" (i.e., how many units)     No 7. DIABETES PILLS: "Do you take any pills for your diabetes?"     glipizide 8. OTHER SYMPTOMS: "Do you have any symptoms?" (e.g., fever, frequent urination, difficulty breathing,  vomiting)     no 9. LOW BLOOD GLUCOSE TREATMENT: "What have you done so far to treat the low blood glucose level?"     eating 10. FOOD: "When did you last eat or drink?"      breakfast 11. ALONE: "Are you alone right now or is someone with you?"        no 12. PREGNANCY: "Is there any chance you are pregnant?" "When was your last menstrual period?"       N/A  Protocols used: DIABETES - LOW BLOOD SUGAR-A-AH

## 2020-06-19 NOTE — Telephone Encounter (Signed)
Has an upcoming appointment tomorrow

## 2020-06-20 ENCOUNTER — Other Ambulatory Visit: Payer: Self-pay

## 2020-06-20 ENCOUNTER — Telehealth: Payer: Self-pay

## 2020-06-20 ENCOUNTER — Encounter: Payer: Self-pay | Admitting: Pharmacist

## 2020-06-20 ENCOUNTER — Ambulatory Visit: Payer: MEDICAID | Attending: Family Medicine | Admitting: Pharmacist

## 2020-06-20 DIAGNOSIS — E1140 Type 2 diabetes mellitus with diabetic neuropathy, unspecified: Secondary | ICD-10-CM | POA: Diagnosis not present

## 2020-06-20 LAB — GLUCOSE, POCT (MANUAL RESULT ENTRY): POC Glucose: 136 mg/dl — AB (ref 70–99)

## 2020-06-20 NOTE — Progress Notes (Signed)
     S:     No chief complaint on file.  Patient arrives well and in good spirits. Patient accompanied by daughter today. Presents for diabetes evaluation, education, and management.  Patient called office yesterday (06/20/20) with complaints of symptomatic hypoglycemia with a BG of 50. Of note, patient recently hospitalized in mid-September with AKI. Since then renal function has worsened with recent Scr of 4.1 and eGFR of 11. Patient was instructed to hold metformin; took last dose of metformin on Wednesday (10/6).  Today, patient reports consistent hypoglycemia. Because of this, she stopped taking glipizide this Saturday (last dose taken on 10/9).   Family/Social History: . - Fhx: stroke, HTN, HLD - Tobacco: current 0.25 PPD smoker  - Alcohol: denies use    Insurance coverage/medication affordability: Medicaid   Patient reported dietary habits: diet has significantly changed/improved since hospitalization. Daughter has been doing grocery shopping/cooking. Very low carb diet, cut out sugary drinks/sodas completely. She is not skipping meals.   O:  Physical Exam  ROS  POCT BG today: 136 (2 hours post meal)   Lab Results  Component Value Date   HGBA1C 7.9 (H) 05/25/2020   There were no vitals filed for this visit.  Lipid Panel     Component Value Date/Time   CHOL 251 (H) 11/25/2018 1506   TRIG 272 (H) 11/25/2018 1506   HDL 44 11/25/2018 1506   CHOLHDL 5.7 (H) 11/25/2018 1506   CHOLHDL 4.8 11/13/2016 1018   VLDL 40 (H) 02/15/2016 0907   LDLCALC 153 (H) 11/25/2018 1506   Home Blood Glucose Reading:  Saturday (10/9) --> 50 (felt dizzy, sweaty, shaky, faint)   Sunday --> 54 (morning fasting)  Monday --> 85 (morning fasting)  Tuesday --> 76 (morning fasting)   Clinical Atherosclerotic Cardiovascular Disease (ASCVD): Yes  The 10-year ASCVD risk score Mikey Bussing DC Jr., et al., 2013) is: 51.5%   Values used to calculate the score:     Age: 65 years     Sex: Female      Is Non-Hispanic African American: No     Diabetic: Yes     Tobacco smoker: Yes     Systolic Blood Pressure: 119 mmHg     Is BP treated: Yes     HDL Cholesterol: 44 mg/dL     Total Cholesterol: 251 mg/dL    A/P: Diabetes longstanding currently controlled, with new-onset hypoglycemia. Discussed with patient an appropriate hypoglycemia management plan. Medication adherence appears appropriate. Due to recent decline in renal function, likely that glipizide renal clearance has been impacted/reduced. Patient will be seeing nephrology soon. -Stop metformin 1 g BID -Stop glipizide 10 mg BID -Counseled on s/sx of and management of hypoglycemia  -Advised to call clinical pharmacist if BG starts to increase significantly or experience any more hypoglycemic events   Written patient instructions provided.  Total time in face to face counseling 15 minutes.   Follow up PCP in one month. Follow up with nephrology.   Harriet Pho, PharmD PGY-1 [facility] Pharm[facility] Resident   06/20/2020 3:55 PM

## 2020-06-20 NOTE — Telephone Encounter (Signed)
FMLA paperwork has been received for patient daughter(dana Gagliano) she is the caregiver for [employer]

## 2020-06-28 ENCOUNTER — Encounter: Payer: Self-pay | Admitting: Nurse Practitioner

## 2020-06-28 ENCOUNTER — Ambulatory Visit (INDEPENDENT_AMBULATORY_CARE_PROVIDER_SITE_OTHER): Payer: Medicaid Other | Admitting: Nurse Practitioner

## 2020-06-28 ENCOUNTER — Other Ambulatory Visit: Payer: Self-pay | Admitting: Nurse Practitioner

## 2020-06-28 ENCOUNTER — Other Ambulatory Visit: Payer: Self-pay

## 2020-06-28 VITALS — BP 220/90 | HR 75 | Ht 63.0 in | Wt 182.0 lb

## 2020-06-28 DIAGNOSIS — A048 Other specified bacterial intestinal infections: Secondary | ICD-10-CM

## 2020-06-28 DIAGNOSIS — Z1211 Encounter for screening for malignant neoplasm of colon: Secondary | ICD-10-CM | POA: Diagnosis not present

## 2020-06-28 DIAGNOSIS — K219 Gastro-esophageal reflux disease without esophagitis: Secondary | ICD-10-CM | POA: Diagnosis not present

## 2020-06-28 DIAGNOSIS — R112 Nausea with vomiting, unspecified: Secondary | ICD-10-CM

## 2020-06-28 DIAGNOSIS — D649 Anemia, unspecified: Secondary | ICD-10-CM

## 2020-06-28 DIAGNOSIS — K5792 Diverticulitis of intestine, part unspecified, without perforation or abscess without bleeding: Secondary | ICD-10-CM | POA: Diagnosis not present

## 2020-06-28 MED ORDER — METOCLOPRAMIDE HCL 5 MG PO TABS
5.0000 mg | ORAL_TABLET | Freq: Three times a day (TID) | ORAL | 0 refills | Status: DC
Start: 2020-06-28 — End: 2020-07-31

## 2020-06-28 MED ORDER — SUPREP BOWEL PREP KIT 17.5-3.13-1.6 GM/177ML PO SOLN: 1.0000 | ORAL | 0 refills | Status: DC

## 2020-06-28 MED FILL — SUPREP BOWEL PREP KIT: 17.5-3.13-1 | 30 days supply | Qty: 354 | Fill #0

## 2020-06-28 MED FILL — METOCLOPRAMIDE 5 MG TABLET: 5 | 30 days supply | Qty: 120 | Fill #0

## 2020-06-28 NOTE — Progress Notes (Addendum)
ASSESSMENT AND PLAN     # Hospitalization in September for nausea, vomiting, diarrhea, uncomplicated sigmoid diverticulitis  --Completed antibiotics. No abdominal pain --Never had a colonoscopy. Needs a screening colonoscopy in mid November - December.  The risks and benefits of colonoscopy with possible polypectomy / biopsies were discussed and the patient agrees to proceed.   # Nausea, vomiting, diarrhea during September hospitalization. Resolved --Stool pathogen panel  / C-diff negative.  --EGD unremarkable. --Nausea and vomiting felt to be secondary to poor glycemic control.  --She was unable to tolerate a gastric emptying study.  N/V responded to Reglan. She is still taking 59m Reglan ac and HS. She cannot omit a dose without having recurrent nausea and vomiting. Patient denies any side effects of Reglan and daughter is also monitoring for side effects.  --She is requesting refill on Reglan. Peviously failedl Phenergan nor Zofran. Will refill Reglan.    # H.pylori infection, positive breath test on 05/23/20 --PCP has her on triple therapy , has two days left.  --.After completion of antibiotics, hold PPI for 14 days then come for H.pylori stool test.   # GERD --Controlled on Prilosec daily. She continued Prilosec while on triple h.pylori treatment. Only needs one PPI. Hold prilosec for now, resume after completion of h.pylori regimen     # Hepatomegaly.  --No liver abnormalities on non-contrast CT scan. LFTs normal. Probable steatosis   # Tina Gomez. Hgb stable at baseline in mid 10 range.  --probably Gomez of chronic disease.  --iron studies don't suggest IDA  # Weight loss. Weight down 18 pounds in Epic since late September --She has made some dietary changes. Additionally, hospitalized in Sept with N/V/D  # Poorly controlled DM, improving --PCP has stopped diabetic meds due to low blood sugars. She has made dietary modifications. Also sounds like renal decrease  decreasing clearance of diabetic medications resulting in hypoglycemia --She is monitoring blood sugars at home which she reports as ranging between 80 and 140  # HTN --Recent discontinuation of BP meds.  Her BP is markedly elevated today. PCP is following.   Addendum: Reviewed and agree with assessment and management plan. Pyrtle, JLajuan Lines MD    HISTORY OF PRESENT ILLNESS     Primary Gastroenterologist : Tina Jarred MD ( hospital)  Chief Complaint : Hospital follow-up for nausea, vomiting, diarrhea and diverticulitis  Tina JBRENDALYN VALLELYis a 64y.o. 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 Emphysema, H.pylori infection, diverticulosis / diverticulitis.   Patient was hospitalized mid-September with nausea, vomiting, diarrhea, AKI and weight loss.  CT scan suggested uncomplicated sigmoid diverticulitis.  GI pathogen panel negative, including C. Difficile.  Given the nausea and vomiting it was felt she would not tolerate H. pylori treatment at the time.  Inpatient EGD unrevealing .  She was unable to tolerate a gastric emptying study due to vomiting . She was treated with Reglan with good response. .Marland Kitchen She was given broad-spectrum antibiotics for presumed diverticulitis  Interval history: Patient is here with her daughter, she feels great.  Still taking 5 mg of Reglan before meals and at bedtime.  She cannot omit any doses without recurrent nausea/vomiting.  No negative side effects from the Reglan including muscle jerking/tics. She previously failed Phenergan and Zofran.  Completed antibiotics for diverticulitis. She has no abdominal pain.  PCP prescribed triple therapy for H. pylori infection, she  has 2 days left.   Patient says that her PCP has stopped all diabetic medications due to renal failure /  hypoglycemia.  She has made some dietary changes.  Blood sugars are ranging 80s to 140s.  She says some of her  HTN medications were also recently stopped. One caused swelling of her tongue and the other swelling in her legs. She takes Coreg.     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 Otherwise normal EGD 3. No cause for nausea and vomiting found on upper endoscopy  05/24/20 Non-contrast CT scan IMPRESSION: 1. Uncomplicated sigmoid diverticulosis with acute diverticulitis. No associated bowel perforation or abscess formation. Post antibiotic treatment and after resolution of inflammatory changes, recommend colonoscopy to evaluate for underlying malignancy given patient's symptoms. 2. Other imaging findings of potential clinical significance: Paraseptal cystic changes of bilateral lower lobes which may represent emphysema versus pulmonary fibrosis. Hepatomegaly.   Past Medical History:  Diagnosis Date  . Hypertension   . Stroke [facility])     Current Medications, Allergies, Past Surgical History, Family History and Social History were reviewed in [employer] record.   Current Outpatient Medications  Medication Sig Dispense Refill  . amoxicillin (AMOXIL) 500 MG capsule Take 2 capsules (1,000 mg total) by mouth 2 (two) times daily. 56 capsule 0  . aspirin 325 MG tablet Take 1 tablet (325 mg total) by mouth daily. 90 tablet 1  . atorvastatin (LIPITOR) 40 MG tablet TAKE 1 TABLET BY MOUTH DAILY AT 6 PM. (Patient taking differently: Take 40 mg by mouth every evening. ) 90 tablet 1  . Blood Glucose Monitoring Suppl (BLOOD GLUCOSE METER) kit Use as instructed 1 each 0  . Blood Glucose Monitoring Suppl (TRUE METRIX METER) DEVI 1 each by Does not apply route 3 (three) times daily before meals. 1 each 0  . carvedilol (COREG) 12.5 MG tablet Take 1 tablet (12.5 mg total) by mouth 2 (two) times daily with a meal. 60 tablet 3  . cetirizine (ZYRTEC) 10 MG tablet Take 1 tablet (10 mg total) by  mouth daily. (Patient taking differently: Take 10 mg by mouth daily as needed for allergies or rhinitis. ) 30 tablet 1  . clarithromycin (BIAXIN) 500 MG tablet Take 1 tablet (500 mg total) by mouth 2 (two) times daily. Hold atorvastatin while on clarithromycin 28 tablet 0  . DULoxetine (CYMBALTA) 60 MG capsule Take 1 capsule (60 mg total) by mouth daily. 90 capsule 1  . glucose blood (TRUETEST TEST) test strip Use as instructed 100 each 12  . Lancets (FREESTYLE) lancets Use as instructed 100 each 12  . metoCLOPramide (REGLAN) 5 MG tablet Take 1 tablet (5 mg total) by mouth 4 (four) times daily -  before meals and at bedtime. 120 tablet 0  . Olopatadine HCl (PATADAY) 0.2 % SOLN Apply 1 drop to eye daily. 2.5 mL 1  . omeprazole (PRILOSEC) 20 MG capsule Take 1 capsule (20 mg total) by mouth 2 (two) times daily before a meal. 28 capsule 0  . promethazine (PHENERGAN) 25 MG suppository Place 1 suppository (25 mg total) rectally every 8 (eight) hours as needed for nausea or vomiting. 20 each 0  . TRUEplus Lancets 28G MISC 1 each by Does not apply route 3 (three) times daily before meals. 100 each 12   No current facility-administered medications for this visit.    Review of Systems: No chest pain. No shortness  of breath. No urinary complaints.   PHYSICAL EXAM :    Wt Readings from Last 3 Encounters:  06/28/20 182 lb (82.6 kg)  06/15/20 199 lb (90.3 kg)  06/01/20 200 lb 9.6 oz (91 kg)    BP (!) 220/90   Pulse 75   Ht _0  (1.6 m)   Wt 182 lb (82.6 kg)   BMI 32.24 kg/m  Constitutional:  Pleasant female in no acute distress. Psychiatric: Normal mood and affect. Behavior is normal. EENT: Pupils normal.  Conjunctivae are normal. No scleral icterus. Neck supple.  Cardiovascular: Normal rate, regular rhythm. No edema Pulmonary/chest: Effort normal and breath sounds normal. No wheezing, rales or rhonchi. Abdominal: Soft, nondistended, nontender. Bowel sounds active throughout. There are no  masses palpable. Diastasis recti.  Neurological: Alert and oriented to person place and time. Skin: Skin is warm and dry. No rashes noted.  Tye Savoy, NP  06/28/2020, 9:10 AM

## 2020-06-28 NOTE — Patient Instructions (Addendum)
Hold your Prilosec( Omeprazole) for 14 days. Submit stool study for H.plyori. Once stool study has been done you may resume Omeprazole.   Your provider has requested that you go to the basement level for lab work on 07/13/20( Bring in stool sample to lab) .Press "B" on the elevator. The lab is located at the first door on the left as you exit the elevator.   If you are age 63 or older, your body mass index should be between 23-30. Your Body mass index is 32.24 kg/m. If this is out of the aforementioned range listed, please consider follow up with your Primary Care Provider.  If you are age 38 or younger, your body mass index should be between 19-25. Your Body mass index is 32.24 kg/m. If this is out of the aformentioned range listed, please consider follow up with your Primary Care Provider.   We have sent the following medications to your pharmacy for you to pick up at your convenience: [place] have been scheduled for a colonoscopy. Please follow written instructions given to you at your visit today.  Please pick up your prep supplies at the pharmacy within the next 1-3 days. If you use inhalers (even only as needed), please bring them with you on the day of your procedure.  Thank you for choosing me and [facility].  Tye Savoy NP

## 2020-07-04 ENCOUNTER — Ambulatory Visit: Payer: Self-pay | Admitting: Family Medicine

## 2020-07-04 NOTE — Addendum Note (Signed)
Addended by: Jerene Bears on: 07/04/2020 09:22 PM   Modules accepted: Level of Service

## 2020-07-17 ENCOUNTER — Ambulatory Visit: Payer: Self-pay | Admitting: Family Medicine

## 2020-07-17 ENCOUNTER — Telehealth: Payer: Self-pay | Admitting: Nurse Practitioner

## 2020-07-17 NOTE — Telephone Encounter (Signed)
Spoke with the patient.  She states vomiting usually occurs in the mornings and then she does well for the rest of the day. In the mornings she will vomit "this snotty phlegm stuff." Today it has been her scrambled eggs. Later she tried chicken noodle soup but vomited that as well. No nausea. She just suddenly vomits. She is trying popsicles right now.  Presently on Reglan 5 mg ac meals and hs. Afebrile. No bowel movement changes.

## 2020-07-17 NOTE — Telephone Encounter (Signed)
Patient called states she is vomiting a lot and would like to know if her Reglan medication can be increased

## 2020-07-18 ENCOUNTER — Other Ambulatory Visit: Payer: Self-pay

## 2020-07-18 MED ORDER — ONDANSETRON 4 MG PO TBDP: 4.0000 mg | ORAL_TABLET | Freq: Three times a day (TID) | ORAL | 0 refills | Status: DC | PRN

## 2020-07-18 MED FILL — ONDANSETRON ODT 4 MG TABLET: 4 | 10 days supply | Qty: 30 | Fill #0

## 2020-07-18 NOTE — Telephone Encounter (Signed)
Patient has not checked her blood sugars in relation to her vomiting. She does not feel like she is high or low. She will monitor this going forward. She is supposed to check her blood sugar once a day, but she does check it more often.  Agrees to try the Zofran again. She will maintain hydration. Today has been a good day. Talk again later this week with an up date of her symptoms.

## 2020-07-18 NOTE — Telephone Encounter (Signed)
Beth, please make sure her blood sugars are not running high. I don't want to increase her Reglan due to kidney disease. I know she didn't feel like Zofran was helpful in the past but may need to retry it prn . We can try 4 mg Q 8 hours prn # 30. Encourage small, frequent meals. Ask her to call you in a few days with an update. Must be able to keep fluids down to avoid dehydration. Thanks

## 2020-07-23 ENCOUNTER — Inpatient Hospital Stay (HOSPITAL_COMMUNITY)
Admission: EM | Admit: 2020-07-23 | Discharge: 2020-07-31 | DRG: 673 | Disposition: A | Discharge status: Home / Self Care | Payer: Medicaid Other | Attending: Internal Medicine | Admitting: Internal Medicine

## 2020-07-23 ENCOUNTER — Inpatient Hospital Stay (HOSPITAL_COMMUNITY): Payer: Medicaid Other

## 2020-07-23 ENCOUNTER — Encounter (HOSPITAL_COMMUNITY): Payer: Self-pay | Admitting: Emergency Medicine

## 2020-07-23 ENCOUNTER — Other Ambulatory Visit: Payer: Self-pay

## 2020-07-23 DIAGNOSIS — Z23 Encounter for immunization: Secondary | ICD-10-CM | POA: Diagnosis not present

## 2020-07-23 DIAGNOSIS — J439 Emphysema, unspecified: Secondary | ICD-10-CM | POA: Diagnosis not present

## 2020-07-23 DIAGNOSIS — E1140 Type 2 diabetes mellitus with diabetic neuropathy, unspecified: Secondary | ICD-10-CM | POA: Diagnosis not present

## 2020-07-23 DIAGNOSIS — F17210 Nicotine dependence, cigarettes, uncomplicated: Secondary | ICD-10-CM | POA: Diagnosis present

## 2020-07-23 DIAGNOSIS — E1110 Type 2 diabetes mellitus with ketoacidosis without coma: Secondary | ICD-10-CM | POA: Diagnosis not present

## 2020-07-23 DIAGNOSIS — N186 End stage renal disease: Secondary | ICD-10-CM | POA: Diagnosis present

## 2020-07-23 DIAGNOSIS — N179 Acute kidney failure, unspecified: Principal | ICD-10-CM

## 2020-07-23 DIAGNOSIS — R112 Nausea with vomiting, unspecified: Secondary | ICD-10-CM

## 2020-07-23 DIAGNOSIS — E43 Unspecified severe protein-calorie malnutrition: Secondary | ICD-10-CM | POA: Insufficient documentation

## 2020-07-23 DIAGNOSIS — I129 Hypertensive chronic kidney disease with stage 1 through stage 4 chronic kidney disease, or unspecified chronic kidney disease: Secondary | ICD-10-CM | POA: Diagnosis not present

## 2020-07-23 DIAGNOSIS — Z79899 Other long term (current) drug therapy: Secondary | ICD-10-CM

## 2020-07-23 DIAGNOSIS — Z9889 Other specified postprocedural states: Secondary | ICD-10-CM

## 2020-07-23 DIAGNOSIS — I10 Essential (primary) hypertension: Secondary | ICD-10-CM | POA: Diagnosis present

## 2020-07-23 DIAGNOSIS — Z823 Family history of stroke: Secondary | ICD-10-CM

## 2020-07-23 DIAGNOSIS — F32A Depression, unspecified: Secondary | ICD-10-CM | POA: Diagnosis present

## 2020-07-23 DIAGNOSIS — I82890 Acute embolism and thrombosis of other specified veins: Secondary | ICD-10-CM

## 2020-07-23 DIAGNOSIS — E669 Obesity, unspecified: Secondary | ICD-10-CM | POA: Diagnosis present

## 2020-07-23 DIAGNOSIS — E1143 Type 2 diabetes mellitus with diabetic autonomic (poly)neuropathy: Secondary | ICD-10-CM | POA: Diagnosis present

## 2020-07-23 DIAGNOSIS — E1122 Type 2 diabetes mellitus with diabetic chronic kidney disease: Secondary | ICD-10-CM | POA: Diagnosis present

## 2020-07-23 DIAGNOSIS — F419 Anxiety disorder, unspecified: Secondary | ICD-10-CM | POA: Diagnosis present

## 2020-07-23 DIAGNOSIS — M898X9 Other specified disorders of bone, unspecified site: Secondary | ICD-10-CM | POA: Diagnosis present

## 2020-07-23 DIAGNOSIS — Z8249 Family history of ischemic heart disease and other diseases of the circulatory system: Secondary | ICD-10-CM

## 2020-07-23 DIAGNOSIS — I120 Hypertensive chronic kidney disease with stage 5 chronic kidney disease or end stage renal disease: Secondary | ICD-10-CM | POA: Diagnosis not present

## 2020-07-23 DIAGNOSIS — E860 Dehydration: Secondary | ICD-10-CM | POA: Diagnosis not present

## 2020-07-23 DIAGNOSIS — E874 Mixed disorder of acid-base balance: Secondary | ICD-10-CM | POA: Diagnosis not present

## 2020-07-23 DIAGNOSIS — R34 Anuria and oliguria: Secondary | ICD-10-CM | POA: Diagnosis not present

## 2020-07-23 DIAGNOSIS — I69351 Hemiplegia and hemiparesis following cerebral infarction affecting right dominant side: Secondary | ICD-10-CM | POA: Diagnosis not present

## 2020-07-23 DIAGNOSIS — D631 Anemia in chronic kidney disease: Secondary | ICD-10-CM | POA: Diagnosis not present

## 2020-07-23 DIAGNOSIS — Z20822 Contact with and (suspected) exposure to covid-19: Secondary | ICD-10-CM | POA: Diagnosis present

## 2020-07-23 DIAGNOSIS — Z83438 Family history of other disorder of lipoprotein metabolism and other lipidemia: Secondary | ICD-10-CM

## 2020-07-23 DIAGNOSIS — I8000 Phlebitis and thrombophlebitis of superficial vessels of unspecified lower extremity: Secondary | ICD-10-CM | POA: Diagnosis present

## 2020-07-23 DIAGNOSIS — D649 Anemia, unspecified: Secondary | ICD-10-CM

## 2020-07-23 DIAGNOSIS — Z992 Dependence on renal dialysis: Secondary | ICD-10-CM

## 2020-07-23 DIAGNOSIS — R748 Abnormal levels of other serum enzymes: Secondary | ICD-10-CM

## 2020-07-23 DIAGNOSIS — I639 Cerebral infarction, unspecified: Secondary | ICD-10-CM | POA: Diagnosis present

## 2020-07-23 DIAGNOSIS — R9431 Abnormal electrocardiogram [ECG] [EKG]: Secondary | ICD-10-CM | POA: Diagnosis present

## 2020-07-23 DIAGNOSIS — Z7982 Long term (current) use of aspirin: Secondary | ICD-10-CM

## 2020-07-23 DIAGNOSIS — E876 Hypokalemia: Secondary | ICD-10-CM | POA: Diagnosis not present

## 2020-07-23 DIAGNOSIS — K219 Gastro-esophageal reflux disease without esophagitis: Secondary | ICD-10-CM | POA: Diagnosis not present

## 2020-07-23 DIAGNOSIS — Z8673 Personal history of transient ischemic attack (TIA), and cerebral infarction without residual deficits: Secondary | ICD-10-CM | POA: Diagnosis present

## 2020-07-23 DIAGNOSIS — Z6831 Body mass index (BMI) 31.0-31.9, adult: Secondary | ICD-10-CM

## 2020-07-23 DIAGNOSIS — N184 Chronic kidney disease, stage 4 (severe): Secondary | ICD-10-CM | POA: Diagnosis not present

## 2020-07-23 DIAGNOSIS — Z419 Encounter for procedure for purposes other than remedying health state, unspecified: Secondary | ICD-10-CM

## 2020-07-23 DIAGNOSIS — K3184 Gastroparesis: Secondary | ICD-10-CM | POA: Diagnosis present

## 2020-07-23 LAB — PROTEIN / CREATININE RATIO, URINE
Creatinine, Urine: 46.68 mg/dL
Protein Creatinine Ratio: 3.13 mg/mg{Cre} — ABNORMAL HIGH (ref 0.00–0.15)
Total Protein, Urine: 146 mg/dL

## 2020-07-23 LAB — URINALYSIS, ROUTINE W REFLEX MICROSCOPIC
Bilirubin Urine: NEGATIVE
Glucose, UA: 50 mg/dL — AB
Ketones, ur: NEGATIVE mg/dL
Leukocytes,Ua: NEGATIVE
Nitrite: NEGATIVE
Protein, ur: >=300 mg/dL — AB
Specific Gravity, Urine: 1.01 (ref 1.005–1.030)
pH: 5 (ref 5.0–8.0)

## 2020-07-23 LAB — LIPASE, BLOOD: Lipase: 52 U/L — ABNORMAL HIGH (ref 11–51)

## 2020-07-23 LAB — RESPIRATORY PANEL BY RT PCR (FLU A&B, COVID)
Influenza A by PCR: NEGATIVE
Influenza B by PCR: NEGATIVE
SARS Coronavirus 2 by RT PCR: NEGATIVE

## 2020-07-23 LAB — CBC
HCT: 29.7 % — ABNORMAL LOW (ref 36.0–46.0)
HCT: 27.1 % — ABNORMAL LOW (ref 36.0–46.0)
Hemoglobin: 9.1 g/dL — ABNORMAL LOW (ref 12.0–15.0)
Hemoglobin: 8.3 g/dL — ABNORMAL LOW (ref 12.0–15.0)
MCH: 27.6 pg (ref 26.0–34.0)
MCH: 27.6 pg (ref 26.0–34.0)
MCHC: 30.6 g/dL (ref 30.0–36.0)
MCHC: 30.6 g/dL (ref 30.0–36.0)
MCV: 90 fL (ref 80.0–100.0)
MCV: 90 fL (ref 80.0–100.0)
Platelets: 359 10*3/uL (ref 150–400)
Platelets: 320 10*3/uL (ref 150–400)
RBC: 3.3 MIL/uL — ABNORMAL LOW (ref 3.87–5.11)
RBC: 3.01 MIL/uL — ABNORMAL LOW (ref 3.87–5.11)
RDW: 15.5 % (ref 11.5–15.5)
RDW: 15.5 % (ref 11.5–15.5)
WBC: 10.8 10*3/uL — ABNORMAL HIGH (ref 4.0–10.5)
WBC: 10.3 10*3/uL (ref 4.0–10.5)
nRBC: 0 % (ref 0.0–0.2)
nRBC: 0 % (ref 0.0–0.2)

## 2020-07-23 LAB — COMPREHENSIVE METABOLIC PANEL
ALT: 6 U/L (ref 0–44)
AST: 8 U/L — ABNORMAL LOW (ref 15–41)
Albumin: 2 g/dL — ABNORMAL LOW (ref 3.5–5.0)
Alkaline Phosphatase: 82 U/L (ref 38–126)
Anion gap: 21 — ABNORMAL HIGH (ref 5–15)
BUN: 138 mg/dL — ABNORMAL HIGH (ref 8–23)
CO2: 11 mmol/L — ABNORMAL LOW (ref 22–32)
Calcium: 5.8 mg/dL — CL (ref 8.9–10.3)
Chloride: 109 mmol/L (ref 98–111)
Creatinine, Ser: 12.49 mg/dL — ABNORMAL HIGH (ref 0.44–1.00)
GFR, Estimated: 3 mL/min — ABNORMAL LOW (ref >60)
Glucose, Bld: 94 mg/dL (ref 70–99)
Potassium: 4.2 mmol/L (ref 3.5–5.1)
Sodium: 141 mmol/L (ref 135–145)
Total Bilirubin: 0.3 mg/dL (ref 0.3–1.2)
Total Protein: 6.1 g/dL — ABNORMAL LOW (ref 6.5–8.1)

## 2020-07-23 LAB — CREATININE, SERUM
Creatinine, Ser: 11.8 mg/dL — ABNORMAL HIGH (ref 0.44–1.00)
GFR, Estimated: 3 mL/min — ABNORMAL LOW (ref >60)

## 2020-07-23 MED ORDER — PANTOPRAZOLE SODIUM 40 MG IV SOLR
40.0000 mg | Freq: Two times a day (BID) | INTRAVENOUS | Status: DC
  Administered 2020-07-23 – 2020-07-28 (×10): 40 mg via INTRAVENOUS
  Filled 2020-07-23 (×10): qty 40

## 2020-07-23 MED ORDER — METOCLOPRAMIDE HCL 5 MG/ML IJ SOLN
10.0000 mg | Freq: Once | INTRAMUSCULAR | Status: AC
  Administered 2020-07-23: 10 mg via INTRAVENOUS
  Filled 2020-07-23: qty 2

## 2020-07-23 MED ORDER — NICOTINE 21 MG/24HR TD PT24
21.0000 mg | MEDICATED_PATCH | Freq: Every day | TRANSDERMAL | Status: DC
  Administered 2020-07-26: 21 mg via TRANSDERMAL
  Filled 2020-07-23 (×7): qty 1

## 2020-07-23 MED ORDER — LACTATED RINGERS IV BOLUS
1000.0000 mL | Freq: Once | INTRAVENOUS | Status: AC
  Administered 2020-07-23: 1000 mL via INTRAVENOUS

## 2020-07-23 MED ORDER — METOCLOPRAMIDE HCL 5 MG/ML IJ SOLN
5.0000 mg | Freq: Four times a day (QID) | INTRAMUSCULAR | Status: DC | PRN
  Filled 2020-07-23: qty 2

## 2020-07-23 MED ORDER — ONDANSETRON HCL 4 MG PO TABS: 8.0000 mg | ORAL_TABLET | Freq: Four times a day (QID) | ORAL | Status: DC | PRN

## 2020-07-23 MED ORDER — CALCIUM CARBONATE 1250 (500 CA) MG PO TABS
1.0000 | ORAL_TABLET | Freq: Two times a day (BID) | ORAL | Status: DC
  Administered 2020-07-24 – 2020-07-31 (×11): 500 mg via ORAL
  Filled 2020-07-23 (×14): qty 1

## 2020-07-23 MED ORDER — HEPARIN SODIUM (PORCINE) 5000 UNIT/ML IJ SOLN
5000.0000 [IU] | Freq: Three times a day (TID) | INTRAMUSCULAR | Status: DC
  Administered 2020-07-23 – 2020-07-31 (×21): 5000 [IU] via SUBCUTANEOUS
  Filled 2020-07-23 (×21): qty 1

## 2020-07-23 MED ORDER — STERILE WATER FOR INJECTION IV SOLN
INTRAVENOUS | Status: DC
  Filled 2020-07-23 (×9): qty 850

## 2020-07-23 MED ORDER — ACETAMINOPHEN 325 MG PO TABS
650.0000 mg | ORAL_TABLET | Freq: Four times a day (QID) | ORAL | Status: DC | PRN
  Administered 2020-07-25 – 2020-07-27 (×2): 650 mg via ORAL
  Filled 2020-07-23 (×3): qty 2

## 2020-07-23 MED ORDER — ONDANSETRON HCL 4 MG PO TABS: 4.0000 mg | ORAL_TABLET | Freq: Four times a day (QID) | ORAL | Status: DC | PRN

## 2020-07-23 MED ORDER — SODIUM CHLORIDE 0.9 % IV SOLN
8.0000 mg | Freq: Four times a day (QID) | INTRAVENOUS | Status: DC | PRN
  Filled 2020-07-23: qty 4

## 2020-07-23 MED ORDER — PROMETHAZINE HCL 25 MG RE SUPP
25.0000 mg | Freq: Three times a day (TID) | RECTAL | Status: DC | PRN
  Filled 2020-07-23 (×2): qty 1

## 2020-07-23 MED ORDER — ASPIRIN 325 MG PO TABS
325.0000 mg | ORAL_TABLET | Freq: Every day | ORAL | Status: DC
  Administered 2020-07-23 – 2020-07-31 (×8): 325 mg via ORAL
  Filled 2020-07-23 (×8): qty 1

## 2020-07-23 MED ORDER — CALCIUM CHLORIDE 10 % IV SOLN
1.0000 g | Freq: Once | INTRAVENOUS | Status: AC
  Administered 2020-07-23: 1 g via INTRAVENOUS

## 2020-07-23 MED ORDER — PROMETHAZINE HCL 25 MG/ML IJ SOLN
12.5000 mg | Freq: Four times a day (QID) | INTRAMUSCULAR | Status: DC | PRN
  Administered 2020-07-23: 12.5 mg via INTRAVENOUS
  Filled 2020-07-23: qty 1

## 2020-07-23 MED ORDER — DULOXETINE HCL 60 MG PO CPEP
60.0000 mg | ORAL_CAPSULE | Freq: Every day | ORAL | Status: DC
  Administered 2020-07-23 – 2020-07-31 (×8): 60 mg via ORAL
  Filled 2020-07-23 (×9): qty 1

## 2020-07-23 MED ORDER — CARVEDILOL 12.5 MG PO TABS
12.5000 mg | ORAL_TABLET | Freq: Two times a day (BID) | ORAL | Status: DC
  Administered 2020-07-23 – 2020-07-31 (×13): 12.5 mg via ORAL
  Filled 2020-07-23 (×15): qty 1

## 2020-07-23 MED ORDER — ONDANSETRON HCL 4 MG/2ML IJ SOLN: 4.0000 mg | Freq: Four times a day (QID) | INTRAMUSCULAR | Status: DC | PRN

## 2020-07-23 MED ORDER — ATORVASTATIN CALCIUM 40 MG PO TABS
40.0000 mg | ORAL_TABLET | Freq: Every evening | ORAL | Status: DC
  Administered 2020-07-24 – 2020-07-30 (×7): 40 mg via ORAL
  Filled 2020-07-23 (×7): qty 1

## 2020-07-23 MED ORDER — SODIUM CHLORIDE 0.9 % IV SOLN: INTRAVENOUS | Status: DC

## 2020-07-23 MED ORDER — SODIUM CHLORIDE 0.9 % IV BOLUS
1000.0000 mL | Freq: Once | INTRAVENOUS | Status: AC
  Administered 2020-07-23: 1000 mL via INTRAVENOUS

## 2020-07-23 MED ORDER — ACETAMINOPHEN 650 MG RE SUPP: 650.0000 mg | Freq: Four times a day (QID) | RECTAL | Status: DC | PRN

## 2020-07-23 MED ORDER — PROMETHAZINE HCL 25 MG/ML IJ SOLN
12.5000 mg | INTRAMUSCULAR | Status: DC | PRN
  Administered 2020-07-23 – 2020-07-26 (×6): 12.5 mg via INTRAVENOUS
  Filled 2020-07-23 (×8): qty 1

## 2020-07-23 NOTE — H&P (Signed)
History and Physical:    Tina Gomez   TGP:[id number] DOB: 12-23-1955 DOA: 07/23/2020  Referring MD/provider: Dr. Roxanne Mins PCP: Tina Rakes, MD   Patient coming from: Home  Chief Complaint: Nausea vomiting and inability to keep any p.o.'s x3 days  History of Present Illness:   Tina Gomez is an 64 y.o. 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  History is per patient and her daughter who is at bedside.   Patient states she did relatively wel after discharge on 06/01/2020 for about a month and subsequently started having intermittent nausea couple of days a week.  Over the past week patient has had marked increase in her nausea and vomiting and has not had a FULL meal for about a week.  She also notes that even trying to drink water has been difficult due to vomiting.  She notes she has been thirsty for the last couple of days.  Over the past 3 days patient has had almost continuous nausea and vomiting unable to be controlled with her usual antinausea medications.  Patient denies any fevers or chills.  No abdominal pain.  No blood in the vomitus.  No melena or hematochezia.  No diarrhea.  Patient admits that she has very poor exercise tolerance and mostly is in bed but this is not anything new.  Denies any new shortness of breath or chest pain.  Patient specifically denies any orthopnea or PND.  She also specifically denies any history of CHF or pulmonary edema.  Denies CAD or MI in past.  ED Course:  The patient was noted to be hypertensive and afebrile.  Laboratory data was notable for acute increase in creatinine to 12.5, up from 4 6 weeks ago.  Patient also noted to have an anion gap of 21 with a bicarb of 11. Patient was admitted for acute renal failure.  ROS:   ROS   Review of  Systems: Per HPI  Past Medical History:   Past Medical History:  Diagnosis Date  . Hypertension   . Stroke [facility])     Past Surgical History:   Past Surgical History:  Procedure Laterality Date  . CESAREAN SECTION    . ESOPHAGOGASTRODUODENOSCOPY (EGD) WITH PROPOFOL N/A 05/26/2020   Procedure: ESOPHAGOGASTRODUODENOSCOPY (EGD) WITH PROPOFOL;  Surgeon: Irene Shipper, MD;  Location: [facility] ENDOSCOPY;  Service: Endoscopy;  Laterality: N/A;    Social History:   Social History   Socioeconomic History  . Marital status: Divorced    Spouse name: Not on file  . Number of children: Not on file  . Years of education: Not on file  . Highest education level: Not on file  Occupational History  . Not on file  Tobacco Use  . Smoking status: Current Every Day Smoker    Packs/day: 0.25    Years: 10.00    Pack years: 2.50    Types: Cigarettes  . Smokeless tobacco: Never Used  . Tobacco comment: 5 cigs daily  Substance and Sexual Activity  . Alcohol use: No  . Drug use: No  . Sexual activity: Not on file  Other Topics Concern  . Not on file  Social History Narrative  . Not on file   Social Determinants of Health   Financial Resource Strain:   . Difficulty of Paying Living Expenses: Not  on file  Food Insecurity:   . Worried About Charity fundraiser in the Last Year: Not on file  . Ran Out of Food in the Last Year: Not on file  Transportation Needs:   . Lack of Transportation (Medical): Not on file  . Lack of Transportation (Non-Medical): Not on file  Physical Activity:   . Days of Exercise per Week: Not on file  . Minutes of Exercise per Session: Not on file  Stress:   . Feeling of Stress : Not on file  Social Connections:   . Frequency of Communication with Friends and Family: Not on file  . Frequency of Social Gatherings with Friends and Family: Not on file  . Attends Religious Services: Not on file  . Active Member of Clubs or Organizations: Not on file  . Attends Theatre manager Meetings: Not on file  . Marital Status: Not on file  Intimate Partner Violence:   . Fear of Current or Ex-Partner: Not on file  . Emotionally Abused: Not on file  . Physically Abused: Not on file  . Sexually Abused: Not on file    Allergies   Codeine and Adhesive [tape]  Family history:   Family History  Problem Relation Age of Onset  . Stroke Mother   . Hypertension Mother   . Hyperlipidemia Mother   . Hypertension Father   . Hyperlipidemia Father     Current Medications:   Prior to Admission medications   Medication Sig Start Date End Date Taking? Authorizing Provider  aspirin 325 MG tablet Take 1 tablet (325 mg total) by mouth daily. 11/25/18  Yes McClung, Angela M, PA-C  atorvastatin (LIPITOR) 40 MG tablet TAKE 1 TABLET BY MOUTH DAILY AT 6 PM. Patient taking differently: Take 40 mg by mouth every evening.  05/23/20  Yes Tina Rakes, MD  carvedilol (COREG) 12.5 MG tablet Take 1 tablet (12.5 mg total) by mouth 2 (two) times daily with a meal. 06/15/20  Yes Newlin, Enobong, MD  clarithromycin (BIAXIN) 500 MG tablet Take 1 tablet (500 mg total) by mouth 2 (two) times daily. Hold atorvastatin while on clarithromycin 06/15/20  Yes Newlin, Enobong, MD  DULoxetine (CYMBALTA) 60 MG capsule Take 1 capsule (60 mg total) by mouth daily. 05/23/20  Yes Tina Rakes, MD  metoCLOPramide (REGLAN) 5 MG tablet Take 1 tablet (5 mg total) by mouth 4 (four) times daily -  before meals and at bedtime. Patient taking differently: Take 5 mg by mouth every 6 (six) hours as needed for nausea or vomiting.  06/28/20  Yes Willia Craze, NP  omeprazole (PRILOSEC) 20 MG capsule Take 1 capsule (20 mg total) by mouth 2 (two) times daily before a meal. 06/15/20  Yes Newlin, Enobong, MD  ondansetron (ZOFRAN ODT) 4 MG disintegrating tablet Take 1 tablet (4 mg total) by mouth every 8 (eight) hours as needed for nausea or vomiting. 07/18/20  Yes Willia Craze, NP  promethazine (PHENERGAN) 25  MG suppository Place 1 suppository (25 mg total) rectally every 8 (eight) hours as needed for nausea or vomiting. 05/23/20  Yes Tina Rakes, MD  amoxicillin (AMOXIL) 500 MG capsule Take 2 capsules (1,000 mg total) by mouth 2 (two) times daily. Patient not taking: Reported on 07/23/2020 06/15/20   Tina Rakes, MD  Blood Glucose Monitoring Suppl (BLOOD GLUCOSE METER) kit Use as instructed 11/01/13   Reyne Dumas, MD  Blood Glucose Monitoring Suppl (TRUE METRIX METER) DEVI 1 each by Does not apply route 3 (  three) times daily before meals. 06/02/20   Tina Rakes, MD  cetirizine (ZYRTEC) 10 MG tablet Take 1 tablet (10 mg total) by mouth daily. Patient not taking: Reported on 07/23/2020 11/13/16   Tina Rakes, MD  glucose blood (TRUETEST TEST) test strip Use as instructed 06/02/20   Tina Rakes, MD  Lancets (FREESTYLE) lancets Use as instructed 11/01/13   Reyne Dumas, MD  Na Sulfate-K Sulfate-Mg Sulf (SUPREP BOWEL PREP KIT) 17.5-3.13-1.6 GM/177ML SOLN Take 1 kit by mouth as directed. For colonoscopy prep 06/28/20   Willia Craze, NP  Olopatadine HCl (PATADAY) 0.2 % SOLN Apply 1 drop to eye daily. Patient not taking: Reported on 07/23/2020 11/13/16   Tina Rakes, MD  TRUEplus Lancets 28G MISC 1 each by Does not apply route 3 (three) times daily before meals. 06/02/20   Tina Rakes, MD    Physical Exam:   Vitals:   07/23/20 0513 07/23/20 0535 07/23/20 0600 07/23/20 0630  BP: (!) 135/56 (!) 164/60 (!) 160/81 (!) 160/61  Pulse: 65 66 72 72  Resp: 12 19 13  (!) 21  Temp:      TempSrc:      SpO2: 97% 99% 91% 96%  Weight:      Height:         Physical Exam: Blood pressure (!) 160/61, pulse 72, temperature 98.4 F (36.9 C), temperature source Oral, resp. rate (!) 21, height 5' 2"  (1.575 m), weight 85 kg, SpO2 96 %. Gen: Chronically ill-appearing female lying in bed looking nauseated and uncomfortable.  Attentive daughter is at bedside. Eyes: sclera anicteric, conjuctiva  mildly injected bilaterally CVS: S1-S2, regulary, no gallops Respiratory:  decreased air entry likely secondary to decreased inspiratory effort GI: NABS, soft, NT, obese, no rebound tenderness, no guarding. LE: Superficial thrombophlebitis along posterior calf on left, she has 1+ edema bilaterally. Psych: patient is logical and coherent, judgement and insight appear normal, mood and affect appropriate to situation.   Data Review:    Labs: Basic Metabolic Panel: Recent Labs  Lab 07/23/20 0239  NA 141  K 4.2  CL 109  CO2 11*  GLUCOSE 94  BUN 138*  CREATININE 12.49*  CALCIUM 5.8*   Liver Function Tests: Recent Labs  Lab 07/23/20 0239  AST 8*  ALT 6  ALKPHOS 82  BILITOT 0.3  PROT 6.1*  ALBUMIN 2.0*   Recent Labs  Lab 07/23/20 0239  LIPASE 52*   No results for input(s): AMMONIA in the last 168 hours. CBC: Recent Labs  Lab 07/23/20 0239  WBC 10.8*  HGB 9.1*  HCT 29.7*  MCV 90.0  PLT 359   Cardiac Enzymes: No results for input(s): CKTOTAL, CKMB, CKMBINDEX, TROPONINI in the last 168 hours.  BNP (last 3 results) No results for input(s): PROBNP in the last 8760 hours. CBG: No results for input(s): GLUCAP in the last 168 hours.  Urinalysis    Component Value Date/Time   COLORURINE YELLOW 07/23/2020 0146   APPEARANCEUR CLOUDY (A) 07/23/2020 0146   LABSPEC 1.010 07/23/2020 0146   PHURINE 5.0 07/23/2020 0146   GLUCOSEU 50 (A) 07/23/2020 0146   HGBUR MODERATE (A) 07/23/2020 0146   BILIRUBINUR NEGATIVE 07/23/2020 0146   KETONESUR NEGATIVE 07/23/2020 0146   PROTEINUR >=300 (A) 07/23/2020 0146   UROBILINOGEN 1.0 10/22/2013 1626   NITRITE NEGATIVE 07/23/2020 0146   LEUKOCYTESUR NEGATIVE 07/23/2020 0146      Radiographic Studies: No results found.  EKG: Ordered and pending    Assessment/Plan:   Principal Problem:   ARF (  acute renal failure) (HCC) Active Problems:   Cerebral infarction ([place])   Type 2 diabetes mellitus with diabetic neuropathy,  unspecified (HCC)   Essential hypertension, benign   GERD (gastroesophageal reflux disease)   Nausea and vomiting   Dehydration   Emphysema of lung (HCC)   Diabetic gastroparesis (HCC)  Acute on chronic renal failure Most likely secondary to intravascular volume depletion superimposed on vulnerable kidneys Patient has received LR 1 L bolus in ED and now has some urine output We will provide another NS 1 L bolus and follow urine output Renal has been consulted given intractable nausea and vomiting likely secondary to uremia. Recheck creatinine later on today  Acid-base disorder Patient has mixed acid-base disorder secondary to acute renal failure and ongoing nausea and vomiting Present high anion gap is most likely secondary to creatinine of 12 Anticipate resolution with control of vomiting and resolution of renal failure  Intractable nausea and vomiting As needed IV Zofran and metoclopramide ordered As needed promethazine suppositories ordered  DM2 Patient appears to be on diet control We will check fingersticks 3 times daily while awake while patient is n.p.o. SSI can be ordered if blood sugars are persistently elevated We will not order for now given renal failure and n.p.o. status  HTN Continue carvedilol 12.5 twice daily Follow blood pressures in house, BP meds may need to be uptitrated as warranted  GERD Change oral omeprazole to pantoprazole IV twice daily per home doses  Anxiety and depression Continue duloxetine 60 mg daily per home doses  History of CVA Continue aspirin 325 and atorvastatin 40 mg per home doses  COPD No evidence for acute flare Tobacco cessation advised, nicotine patch 21 mg ordered Continue home regimen and as needed inhaled bronchodilators  Other information:   DVT prophylaxis: Heparin ordered. Code Status: Full Family Communication: Discussed with patient's daughter who was at bedside Disposition Plan: Home Consults called:  Nephrology Admission status: Inpatient  [facility]  If 7PM-7AM, please contact night-coverage [email or web address] Password [email or web address] 07/23/2020, 8:30 AM

## 2020-07-23 NOTE — Consult Note (Addendum)
[facility] Nephrology Consultation Note  Requesting MD: Dr. Jamse Arn Reason for consult: AKI on CKD  HPI:  Tina Gomez is a 64 y.o. female with PMH of uncontrolled diabetes, HTN, gastroparesis, stroke, CKD stage III/IV presented with intractable nausea, vomiting, decreased oral intake for about a week, seen as a consultation for the evaluation of AKI on CKD. Patient has been diagnosed with diabetic gastroparesis and follows with GI.  For about a week she has intractable vomiting associated with decreased oral intake.  Unable to drink liquids or water.  Also noticed no urine output for about 12 hours.  She started feeling fatigue, weak.  She has longstanding CKD with recent progression of her CKD with baseline creatinine level around 3.4-4.0.  She does not have a nephrologist however recently referred her to [facility]. On arrival to the ER, she was in room air, BP elevated.  The labs showed BUN 138, creatinine level elevated to 12.49, CO2 11, potassium 4.2.  She received 1 L of LR and 1 L of NS and is started on maintenance NS IV fluid. She is not on ACE inhibitor or ARB.  Denies use of NSAIDs.  Usually takes Reglan for her nausea but recently not working.  Her daughter is at bedside in ER.  Creatinine, Ser  Date/Time Value Ref Range Status  07/23/2020 11:10 AM 11.80 (H) 0.44 - 1.00 mg/dL Final  07/23/2020 02:39 AM 12.49 (H) 0.44 - 1.00 mg/dL Final  06/15/2020 11:34 AM 4.10 (H) 0.57 - 1.00 mg/dL Final  06/01/2020 10:38 AM 3.43 (H) 0.44 - 1.00 mg/dL Final  05/31/2020 02:24 PM 3.50 (H) 0.44 - 1.00 mg/dL Final    PMHx:   Past Medical History:  Diagnosis Date  . Hypertension   . Stroke [facility])     Past Surgical History:  Procedure Laterality Date  . CESAREAN SECTION    . ESOPHAGOGASTRODUODENOSCOPY (EGD) WITH PROPOFOL N/A 05/26/2020   Procedure: ESOPHAGOGASTRODUODENOSCOPY (EGD) WITH PROPOFOL;  Surgeon: Irene Shipper, MD;  Location: [facility] ENDOSCOPY;  Service:  Endoscopy;  Laterality: N/A;    Family Hx:  Family History  Problem Relation Age of Onset  . Stroke Mother   . Hypertension Mother   . Hyperlipidemia Mother   . Hypertension Father   . Hyperlipidemia Father     Social History:  reports that she has been smoking cigarettes. She has a 2.50 pack-year smoking history. She has never used smokeless tobacco. She reports that she does not drink alcohol and does not use drugs.  Allergies:  Allergies  Allergen Reactions  . Codeine Nausea And Vomiting  . Adhesive [Tape] Other (See Comments)    Tape breaks out the skin if it is left on for a lengthy period of time    Medications: Prior to Admission medications   Medication Sig Start Date End Date Taking? Authorizing Provider  aspirin 325 MG tablet Take 1 tablet (325 mg total) by mouth daily. 11/25/18  Yes McClung, Angela M, PA-C  atorvastatin (LIPITOR) 40 MG tablet TAKE 1 TABLET BY MOUTH DAILY AT 6 PM. Patient taking differently: Take 40 mg by mouth every evening.  05/23/20  Yes Charlott Rakes, MD  carvedilol (COREG) 12.5 MG tablet Take 1 tablet (12.5 mg total) by mouth 2 (two) times daily with a meal. 06/15/20  Yes Newlin, Enobong, MD  clarithromycin (BIAXIN) 500 MG tablet Take 1 tablet (500 mg total) by mouth 2 (two) times daily. Hold atorvastatin while on clarithromycin 06/15/20  Yes Charlott Rakes, MD  DULoxetine (CYMBALTA) 60 MG capsule Take 1 capsule (60 mg total) by mouth daily. 05/23/20  Yes Charlott Rakes, MD  metoCLOPramide (REGLAN) 5 MG tablet Take 1 tablet (5 mg total) by mouth 4 (four) times daily -  before meals and at bedtime. Patient taking differently: Take 5 mg by mouth every 6 (six) hours as needed for nausea or vomiting.  06/28/20  Yes Willia Craze, NP  omeprazole (PRILOSEC) 20 MG capsule Take 1 capsule (20 mg total) by mouth 2 (two) times daily before a meal. 06/15/20  Yes Newlin, Enobong, MD  ondansetron (ZOFRAN ODT) 4 MG disintegrating tablet Take 1 tablet (4 mg total)  by mouth every 8 (eight) hours as needed for nausea or vomiting. 07/18/20  Yes Willia Craze, NP  promethazine (PHENERGAN) 25 MG suppository Place 1 suppository (25 mg total) rectally every 8 (eight) hours as needed for nausea or vomiting. 05/23/20  Yes Charlott Rakes, MD  amoxicillin (AMOXIL) 500 MG capsule Take 2 capsules (1,000 mg total) by mouth 2 (two) times daily. Patient not taking: Reported on 07/23/2020 06/15/20   Charlott Rakes, MD  Blood Glucose Monitoring Suppl (BLOOD GLUCOSE METER) kit Use as instructed 11/01/13   Reyne Dumas, MD  Blood Glucose Monitoring Suppl (TRUE METRIX METER) DEVI 1 each by Does not apply route 3 (three) times daily before meals. 06/02/20   Charlott Rakes, MD  cetirizine (ZYRTEC) 10 MG tablet Take 1 tablet (10 mg total) by mouth daily. Patient not taking: Reported on 07/23/2020 11/13/16   Charlott Rakes, MD  glucose blood (TRUETEST TEST) test strip Use as instructed 06/02/20   Charlott Rakes, MD  Lancets (FREESTYLE) lancets Use as instructed 11/01/13   Reyne Dumas, MD  Na Sulfate-K Sulfate-Mg Sulf (SUPREP BOWEL PREP KIT) 17.5-3.13-1.6 GM/177ML SOLN Take 1 kit by mouth as directed. For colonoscopy prep 06/28/20   Willia Craze, NP  Olopatadine HCl (PATADAY) 0.2 % SOLN Apply 1 drop to eye daily. Patient not taking: Reported on 07/23/2020 11/13/16   Charlott Rakes, MD  TRUEplus Lancets 28G MISC 1 each by Does not apply route 3 (three) times daily before meals. 06/02/20   Charlott Rakes, MD    I have reviewed the patient's current medications.  Labs:  Results for orders placed or performed during the hospital encounter of 07/23/20 (from the past 48 hour(s))  Urinalysis, Routine w reflex microscopic Urine, Clean Catch     Status: Abnormal   Collection Time: 07/23/20  1:46 AM  Result Value Ref Range   Color, Urine YELLOW YELLOW   APPearance CLOUDY (A) CLEAR   Specific Gravity, Urine 1.010 1.005 - 1.030   pH 5.0 5.0 - 8.0   Glucose, UA 50 (A) NEGATIVE  mg/dL   Hgb urine dipstick MODERATE (A) NEGATIVE   Bilirubin Urine NEGATIVE NEGATIVE   Ketones, ur NEGATIVE NEGATIVE mg/dL   Protein, ur >=300 (A) NEGATIVE mg/dL   Nitrite NEGATIVE NEGATIVE   Leukocytes,Ua NEGATIVE NEGATIVE   RBC / HPF 21-50 0 - 5 RBC/hpf   WBC, UA 11-20 0 - 5 WBC/hpf   Bacteria, UA RARE (A) NONE SEEN   Squamous Epithelial / LPF 11-20 0 - 5    Comment: Performed at [facility], [address], [place], [place] [address]  Lipase, blood     Status: Abnormal   Collection Time: 07/23/20  2:39 AM  Result Value Ref Range   Lipase 52 (H) 11 - 51 U/L    Comment: Performed at [facility],  [address], [place], [place] [address]  Comprehensive metabolic panel     Status: Abnormal   Collection Time: 07/23/20  2:39 AM  Result Value Ref Range   Sodium 141 135 - 145 mmol/L   Potassium 4.2 3.5 - 5.1 mmol/L   Chloride 109 98 - 111 mmol/L   CO2 11 (L) 22 - 32 mmol/L   Glucose, Bld 94 70 - 99 mg/dL    Comment: Glucose reference range applies only to samples taken after fasting for at least 8 hours.   BUN 138 (H) 8 - 23 mg/dL   Creatinine, Ser 12.49 (H) 0.44 - 1.00 mg/dL   Calcium 5.8 (LL) 8.9 - 10.3 mg/dL    Comment: CRITICAL RESULT CALLED TO, READ BACK BY AND VERIFIED WITH: BOBBY [facility] RN [phone]TT    Total Protein 6.1 (L) 6.5 - 8.1 g/dL   Albumin 2.0 (L) 3.5 - 5.0 g/dL   AST 8 (L) 15 - 41 U/L   ALT 6 0 - 44 U/L   Alkaline Phosphatase 82 38 - 126 U/L   Total Bilirubin 0.3 0.3 - 1.2 mg/dL   GFR, Estimated 3 (L) >60 mL/min    Comment: (NOTE) Calculated using the CKD-EPI Creatinine Equation (2021)    Anion gap 21 (H) 5 - 15    Comment: Performed at [facility], [place] [address], [place], [place] [address]  CBC     Status: Abnormal   Collection Time: 07/23/20  2:39 AM  Result Value Ref Range   WBC 10.8 (H) 4.0 - 10.5 K/uL   RBC 3.30 (L) 3.87 - 5.11 MIL/uL   Hemoglobin 9.1 (L) 12.0 - 15.0 g/dL   HCT 29.7 (L) 36 - 46 %   MCV 90.0 80.0  - 100.0 fL   MCH 27.6 26.0 - 34.0 pg   MCHC 30.6 30.0 - 36.0 g/dL   RDW 15.5 11.5 - 15.5 %   Platelets 359 150 - 400 K/uL   nRBC 0.0 0.0 - 0.2 %    Comment: Performed at [facility], [place] [address], [place], [place] [address]  Respiratory Panel by RT PCR (Flu A&B, Covid) - Nasopharyngeal Swab     Status: None   Collection Time: 07/23/20  6:20 AM   Specimen: Nasopharyngeal Swab  Result Value Ref Range   SARS Coronavirus 2 by RT PCR NEGATIVE NEGATIVE    Comment: (NOTE) SARS-CoV-2 target nucleic acids are NOT DETECTED.  The SARS-CoV-2 RNA is generally detectable in upper respiratoy specimens during the acute phase of infection. The lowest concentration of SARS-CoV-2 viral copies this assay can detect is 131 copies/mL. A negative result does not preclude SARS-Cov-2 infection and should not be used as the sole basis for treatment or other patient management decisions. A negative result may occur with  improper specimen collection/handling, submission of specimen other than nasopharyngeal swab, presence of viral mutation(s) within the areas targeted by this assay, and inadequate number of viral copies (<131 copies/mL). A negative result must be combined with clinical observations, patient history, and epidemiological information. The expected result is Negative.  Fact Sheet for Patients:  PinkCheek.be  Fact Sheet for Healthcare Providers:  GravelBags.it  This test is no t yet approved or cleared by the [place] FDA and  has been authorized for detection and/or diagnosis of SARS-CoV-2 by FDA under an Emergency Use Authorization (EUA). This EUA will remain  in effect (meaning this test can be used) for the duration of the  COVID-19 declaration under Section 564(b)(1) of the Act, 21 U.S.C. section 360bbb-3(b)(1), unless the authorization is terminated or revoked sooner.     Influenza A by PCR NEGATIVE NEGATIVE    Influenza B by PCR NEGATIVE NEGATIVE    Comment: (NOTE) The Xpert Xpress SARS-CoV-2/FLU/RSV assay is intended as an aid in  the diagnosis of influenza from Nasopharyngeal swab specimens and  should not be used as a sole basis for treatment. Nasal washings and  aspirates are unacceptable for Xpert Xpress SARS-CoV-2/FLU/RSV  testing.  Fact Sheet for Patients: PinkCheek.be  Fact Sheet for Healthcare Providers: GravelBags.it  This test is not yet approved or cleared by the [place] FDA and  has been authorized for detection and/or diagnosis of SARS-CoV-2 by  FDA under an Emergency Use Authorization (EUA). This EUA will remain  in effect (meaning this test can be used) for the duration of the  Covid-19 declaration under Section 564(b)(1) of the Act, 21  U.S.C. section 360bbb-3(b)(1), unless the authorization is  terminated or revoked. Performed at [facility], [place] [address] [place], [place] [address]   CBC     Status: Abnormal   Collection Time: 07/23/20 11:10 AM  Result Value Ref Range   WBC 10.3 4.0 - 10.5 K/uL   RBC 3.01 (L) 3.87 - 5.11 MIL/uL   Hemoglobin 8.3 (L) 12.0 - 15.0 g/dL   HCT 27.1 (L) 36 - 46 %   MCV 90.0 80.0 - 100.0 fL   MCH 27.6 26.0 - 34.0 pg   MCHC 30.6 30.0 - 36.0 g/dL   RDW 15.5 11.5 - 15.5 %   Platelets 320 150 - 400 K/uL   nRBC 0.0 0.0 - 0.2 %    Comment: Performed at [facility], [place] [address] [place], [place] [address]  Creatinine, serum     Status: Abnormal   Collection Time: 07/23/20 11:10 AM  Result Value Ref Range   Creatinine, Ser 11.80 (H) 0.44 - 1.00 mg/dL   GFR, Estimated 3 (L) >60 mL/min    Comment: (NOTE) Calculated using the CKD-EPI Creatinine Equation (2021) Performed at [place] [address], [place], [place] [address]      ROS:  Pertinent items noted in HPI and remainder of comprehensive ROS otherwise negative.  Physical Exam: Vitals:    07/23/20 1230 07/23/20 1330  BP: (!) 160/53 (!) 159/63  Pulse: 69 67  Resp: 16 13  Temp:    SpO2: 94% 97%     General exam: Ill-looking female lying on bed, dry mucous membrane. Respiratory system: Clear to auscultation. Respiratory effort normal. No wheezing or crackle Cardiovascular system: S1 & S2 heard, RRR.  No pedal edema. Gastrointestinal system: Abdomen is nondistended, soft and nontender. Normal bowel sounds heard. Central nervous system: Alert and oriented. No focal neurological deficits. Extremities: Symmetric 5 x 5 power. Skin: No rashes, lesions or ulcers Psychiatry: Judgement and insight appear normal. Mood & affect appropriate.   Assessment/Plan:  #Acute kidney injury on CKD IV/V due to severe dehydration caused by intractable nausea, vomiting and decreased oral intake.  She is at high risk of worsening renal failure.  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  Looks dry on physical exam.   Urinalysis likely contaminated however has proteinuria, check urine PCR. Given acidosis, start sodium bicarbonate IVF at 150 cc an hour. Order kidney ultrasound, bladder scan Strict ins and out. I have discussed with the patient and her daughter that if no improvement in her kidney function  with IV fluid then, she is close to require dialysis.  They are okay with dialysis if needed.  Hopefully the kidney function will gradually improve with IVF.  #Intractable nausea vomiting due to gastroparesis: She has a history of diabetes.  Followed by GI as outpatient.  Continue supportive care, antiemetics per primary team.  #Anion gap metabolic acidosis due to renal failure: Starting IV sodium bicarbonate.  #Anemia of CKD, chronic illness: Check iron studies.  Monitor hemoglobin.  #CKD MBD, hypercalcemia: Check PTH level.  Calcium level is really low therefore I will order IV calcium and start Os-Cal twice daily.  Check phosphorus level.  #Hypertension: On carvedilol.  Consider adding amlodipine 5  mg if  BP remains elevated.  Thank you for the consult, will follow with you.     Marlie Kuennen Tanna Furry 07/23/2020, 1:38 PM  [facility].

## 2020-07-23 NOTE — ED Provider Notes (Signed)
[facility] EMERGENCY DEPARTMENT Provider Note   CSN: [id number] Arrival date & time: 07/23/20  0127   History Chief Complaint  Patient presents with  . Abdominal Pain    Gastroparesis    Ruqayya ASNA Gomez is a 64 y.o. female.  The history is provided by the patient.  Abdominal Pain She has history of hypertension, diabetes, stroke, gastroparesis, emphysema and comes in because of persistent nausea and vomiting and inability to hold anything on her stomach.  She has been having difficulty with vomiting for 6 months, but it has gotten worse.  She had been admitted to the hospital 2 months ago at which time she also had some kidney failure.  She denies abdominal pain but is complaining of some pain in her left calf.  She has been taking metoclopramide, but it is not helping.  Past Medical History:  Diagnosis Date  . Hypertension   . Stroke [facility])     Patient Active Problem List   Diagnosis Date Noted  . Diabetic gastroparesis ([place])   . Abnormal finding on GI tract imaging   . H. pylori infection   . Diverticulitis 05/24/2020  . History of stroke 05/24/2020  . Anemia 05/24/2020  . Nausea and vomiting 05/24/2020  . AKI (acute kidney injury) ([place]) 05/24/2020  . Dehydration 05/24/2020  . Hypomagnesemia 05/24/2020  . Hypokalemia 05/24/2020  . Tobacco abuse 05/24/2020  . Emphysema of lung ([place]) 05/24/2020  . Psoriasis 02/14/2016  . Type 2 diabetes mellitus with diabetic neuropathy, unspecified ([place]) 01/18/2014  . Essential hypertension, benign 01/18/2014  . GERD (gastroesophageal reflux disease) 01/18/2014  . Hyperlipidemia 01/18/2014  . CVA (cerebral infarction) 10/22/2013  . 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 CESAREAN SECTION    . ESOPHAGOGASTRODUODENOSCOPY (EGD) WITH PROPOFOL N/A 05/26/2020   Procedure: ESOPHAGOGASTRODUODENOSCOPY (EGD) WITH PROPOFOL;  Surgeon: Irene Shipper, MD;  Location: [facility]  ENDOSCOPY;  Service: Endoscopy;  Laterality: N/A;     OB History   No obstetric history on file.     Family History  Problem Relation Age of Onset  . Stroke Mother   . Hypertension Mother   . Hyperlipidemia Mother   . Hypertension Father   . Hyperlipidemia Father     Social History   Tobacco Use  . Smoking status: Current Every Day Smoker    Packs/day: 0.25    Years: 10.00    Pack years: 2.50    Types: Cigarettes  . Smokeless tobacco: Never Used  . Tobacco comment: 5 cigs daily  Substance Use Topics  . Alcohol use: No  . Drug use: No    Home Medications Prior to Admission medications   Medication Sig Start Date End Date Taking? Authorizing Provider  amoxicillin (AMOXIL) 500 MG capsule Take 2 capsules (1,000 mg total) by mouth 2 (two) times daily. 06/15/20   Charlott Rakes, MD  aspirin 325 MG tablet Take 1 tablet (325 mg total) by mouth daily. 11/25/18   Argentina Donovan, PA-C  atorvastatin (LIPITOR) 40 MG tablet TAKE 1 TABLET BY MOUTH DAILY AT 6 PM. Patient taking differently: Take 40 mg by mouth every evening.  05/23/20   Charlott Rakes, MD  Blood Glucose Monitoring Suppl (BLOOD GLUCOSE METER) kit Use as instructed 11/01/13   Reyne Dumas, MD  Blood Glucose Monitoring Suppl (TRUE METRIX METER) DEVI 1 each by Does not apply route 3 (three) times daily before meals. 06/02/20   Charlott Rakes, MD  carvedilol (COREG)  12.5 MG tablet Take 1 tablet (12.5 mg total) by mouth 2 (two) times daily with a meal. 06/15/20   Charlott Rakes, MD  cetirizine (ZYRTEC) 10 MG tablet Take 1 tablet (10 mg total) by mouth daily. Patient taking differently: Take 10 mg by mouth daily as needed for allergies or rhinitis.  11/13/16   Charlott Rakes, MD  clarithromycin (BIAXIN) 500 MG tablet Take 1 tablet (500 mg total) by mouth 2 (two) times daily. Hold atorvastatin while on clarithromycin 06/15/20   Charlott Rakes, MD  DULoxetine (CYMBALTA) 60 MG capsule Take 1 capsule (60 mg total) by mouth daily.  05/23/20   Charlott Rakes, MD  glucose blood (TRUETEST TEST) test strip Use as instructed 06/02/20   Charlott Rakes, MD  Lancets (FREESTYLE) lancets Use as instructed 11/01/13   Reyne Dumas, MD  metoCLOPramide (REGLAN) 5 MG tablet Take 1 tablet (5 mg total) by mouth 4 (four) times daily -  before meals and at bedtime. 06/28/20   Willia Craze, NP  Na Sulfate-K Sulfate-Mg Sulf (SUPREP BOWEL PREP KIT) 17.5-3.13-1.6 GM/177ML SOLN Take 1 kit by mouth as directed. For colonoscopy prep 06/28/20   Willia Craze, NP  Olopatadine HCl (PATADAY) 0.2 % SOLN Apply 1 drop to eye daily. 11/13/16   Charlott Rakes, MD  omeprazole (PRILOSEC) 20 MG capsule Take 1 capsule (20 mg total) by mouth 2 (two) times daily before a meal. 06/15/20   Newlin, Enobong, MD  ondansetron (ZOFRAN ODT) 4 MG disintegrating tablet Take 1 tablet (4 mg total) by mouth every 8 (eight) hours as needed for nausea or vomiting. 07/18/20   Willia Craze, NP  promethazine (PHENERGAN) 25 MG suppository Place 1 suppository (25 mg total) rectally every 8 (eight) hours as needed for nausea or vomiting. 05/23/20   Charlott Rakes, MD  TRUEplus Lancets 28G MISC 1 each by Does not apply route 3 (three) times daily before meals. 06/02/20   Charlott Rakes, MD    Allergies    Codeine and Adhesive [tape]  Review of Systems   Review of Systems  Gastrointestinal: Positive for abdominal pain.  All other systems reviewed and are negative.   Physical Exam Updated Vital Signs BP (!) 164/60   Pulse 66   Temp 98.4 F (36.9 C) (Oral)   Resp 19   Ht 5' 2" (1.575 m)   Wt 85 kg   SpO2 99%   BMI 34.27 kg/m   Physical Exam Vitals and nursing note reviewed.   64 year old female, resting comfortably and in no acute distress. Vital signs are significant for elevated blood pressure. Oxygen saturation is 99%, which is normal. Head is normocephalic and atraumatic. PERRLA, EOMI. Oropharynx is clear. Neck is nontender and supple without adenopathy  or JVD. Back is nontender and there is no CVA tenderness. Lungs are clear without rales, wheezes, or rhonchi. Chest is nontender. Heart has regular rate and rhythm without murmur. Abdomen is soft, flat, nontender without masses or hepatosplenomegaly and peristalsis is hypoactive. Extremities have no cyanosis or edema, full range of motion is present.  Tender cord palpable in the posterior aspect of the left calf which is felt to be most consistent with superficial phlebitis. Skin is warm and dry without rash. Neurologic: Mental status is normal, cranial nerves are intact, there are no motor or sensory deficits.  ED Results / Procedures / Treatments   Labs (all labs ordered are listed, but only abnormal results are displayed) Labs Reviewed  LIPASE, BLOOD - Abnormal; Notable for  the following components:      Result Value   Lipase 52 (*)    All other components within normal limits  COMPREHENSIVE METABOLIC PANEL - Abnormal; Notable for the following components:   CO2 11 (*)    BUN 138 (*)    Creatinine, Ser 12.49 (*)    Calcium 5.8 (*)    Total Protein 6.1 (*)    Albumin 2.0 (*)    AST 8 (*)    GFR, Estimated 3 (*)    Anion gap 21 (*)    All other components within normal limits  CBC - Abnormal; Notable for the following components:   WBC 10.8 (*)    RBC 3.30 (*)    Hemoglobin 9.1 (*)    HCT 29.7 (*)    All other components within normal limits  URINALYSIS, ROUTINE W REFLEX MICROSCOPIC - Abnormal; Notable for the following components:   APPearance CLOUDY (*)    Glucose, UA 50 (*)    Hgb urine dipstick MODERATE (*)    Protein, ur >=300 (*)    Bacteria, UA RARE (*)    All other components within normal limits   Procedures Procedures  CRITICAL CARE Performed by: Delora Fuel Total critical care time: 35 minutes Critical care time was exclusive of separately billable procedures and treating other patients. Critical care was necessary to treat or prevent imminent or  life-threatening deterioration. Critical care was time spent personally by me on the following activities: development of treatment plan with patient and/or surrogate as well as nursing, discussions with consultants, evaluation of patient's response to treatment, examination of patient, obtaining history from patient or surrogate, ordering and performing treatments and interventions, ordering and review of laboratory studies, ordering and review of radiographic studies, pulse oximetry and re-evaluation of patient's condition.  Medications Ordered in ED Medications  lactated ringers bolus 1,000 mL (has no administration in time range)  metoCLOPramide (REGLAN) injection 10 mg (has no administration in time range)    ED Course  I have reviewed the triage vital signs and the nursing notes.  Pertinent lab results that were available during my care of the patient were reviewed by me and considered in my medical decision making (see chart for details).  MDM Rules/Calculators/A&P Patient with history of gastroparesis and presents with ongoing inability to eat and drink and hold things down.  Old records reviewed confirming hospitalization 2 months ago for acute kidney injury and gastroparesis.  At discharge, creatinine was 3.43.  Follow-up creatinine on 10/7 was 4.1.  Today, creatinine is 12.49.  There is also mild elevation of lipase which is not felt to be clinically significant.  Anemia is noted, slightly worse than previously.  This may be secondary to her worsening renal failure.  She will clearly need to be admitted.  She is given IV fluids and ondansetron.  However, BUN is 138 and a BUN/creatinine ratio is close to 10 suggesting intrinsic kidney disease rather than prerenal azotemia.  I am concerned that she will need to go on dialysis.  Case is discussed with Dr. Alcario Drought of [facility], who agrees to admit the patient.  Final Clinical Impression(s) / ED Diagnoses Final diagnoses:  Acute  renal failure superimposed on stage 4 chronic kidney disease, unspecified acute renal failure type (HCC)  Normochromic normocytic anemia  Intractable vomiting with nausea, unspecified vomiting type  Elevated lipase    Rx / DC Orders ED Discharge Orders    None       Delora Fuel,  MD 07/23/20 [phone]

## 2020-07-23 NOTE — Progress Notes (Signed)
Left lower extremity venous duplex has been completed. Preliminary results can be found in CV Proc through chart review.   07/23/20 3:06 PM Carlos Levering RVT

## 2020-07-23 NOTE — ED Triage Notes (Signed)
Patient reports generalized abdominal pain with emesis and diarrhea for several weeks , history of gastroparesis , denies fever or chills . Patient added anuria for 12 hours .

## 2020-07-24 DIAGNOSIS — J439 Emphysema, unspecified: Secondary | ICD-10-CM

## 2020-07-24 DIAGNOSIS — E1143 Type 2 diabetes mellitus with diabetic autonomic (poly)neuropathy: Secondary | ICD-10-CM

## 2020-07-24 DIAGNOSIS — R112 Nausea with vomiting, unspecified: Secondary | ICD-10-CM

## 2020-07-24 DIAGNOSIS — K3184 Gastroparesis: Secondary | ICD-10-CM

## 2020-07-24 DIAGNOSIS — E860 Dehydration: Secondary | ICD-10-CM

## 2020-07-24 DIAGNOSIS — I10 Essential (primary) hypertension: Secondary | ICD-10-CM

## 2020-07-24 DIAGNOSIS — K219 Gastro-esophageal reflux disease without esophagitis: Secondary | ICD-10-CM

## 2020-07-24 DIAGNOSIS — E1140 Type 2 diabetes mellitus with diabetic neuropathy, unspecified: Secondary | ICD-10-CM

## 2020-07-24 LAB — CBC
HCT: 26.3 % — ABNORMAL LOW (ref 36.0–46.0)
Hemoglobin: 8.2 g/dL — ABNORMAL LOW (ref 12.0–15.0)
MCH: 27.5 pg (ref 26.0–34.0)
MCHC: 31.2 g/dL (ref 30.0–36.0)
MCV: 88.3 fL (ref 80.0–100.0)
Platelets: 322 10*3/uL (ref 150–400)
RBC: 2.98 MIL/uL — ABNORMAL LOW (ref 3.87–5.11)
RDW: 15.5 % (ref 11.5–15.5)
WBC: 6.8 10*3/uL (ref 4.0–10.5)
nRBC: 0 % (ref 0.0–0.2)

## 2020-07-24 LAB — RENAL FUNCTION PANEL
Albumin: 1.7 g/dL — ABNORMAL LOW (ref 3.5–5.0)
Anion gap: 23 — ABNORMAL HIGH (ref 5–15)
BUN: 125 mg/dL — ABNORMAL HIGH (ref 8–23)
CO2: 14 mmol/L — ABNORMAL LOW (ref 22–32)
Calcium: 6 mg/dL — CL (ref 8.9–10.3)
Chloride: 107 mmol/L (ref 98–111)
Creatinine, Ser: 11.32 mg/dL — ABNORMAL HIGH (ref 0.44–1.00)
GFR, Estimated: 3 mL/min — ABNORMAL LOW (ref >60)
Glucose, Bld: 66 mg/dL — ABNORMAL LOW (ref 70–99)
Phosphorus: >30 mg/dL — ABNORMAL HIGH (ref 2.5–4.6)
Potassium: 3.6 mmol/L (ref 3.5–5.1)
Sodium: 144 mmol/L (ref 135–145)

## 2020-07-24 LAB — FERRITIN: Ferritin: 201 ng/mL (ref 11–307)

## 2020-07-24 LAB — PHOSPHORUS: Phosphorus: >30 mg/dL — ABNORMAL HIGH (ref 2.5–4.6)

## 2020-07-24 LAB — BASIC METABOLIC PANEL
Anion gap: 20 — ABNORMAL HIGH (ref 5–15)
BUN: 122 mg/dL — ABNORMAL HIGH (ref 8–23)
CO2: 16 mmol/L — ABNORMAL LOW (ref 22–32)
Calcium: 5.9 mg/dL — CL (ref 8.9–10.3)
Chloride: 106 mmol/L (ref 98–111)
Creatinine, Ser: 11.34 mg/dL — ABNORMAL HIGH (ref 0.44–1.00)
GFR, Estimated: 3 mL/min — ABNORMAL LOW (ref >60)
Glucose, Bld: 72 mg/dL (ref 70–99)
Potassium: 3.5 mmol/L (ref 3.5–5.1)
Sodium: 142 mmol/L (ref 135–145)

## 2020-07-24 LAB — IRON AND TIBC
Iron: 58 ug/dL (ref 28–170)
Saturation Ratios: 41 % — ABNORMAL HIGH (ref 10.4–31.8)
TIBC: 140 ug/dL — ABNORMAL LOW (ref 250–450)
UIBC: 82 ug/dL

## 2020-07-24 LAB — CK: Total CK: 47 U/L (ref 38–234)

## 2020-07-24 MED ORDER — POTASSIUM CHLORIDE 10 MEQ/100ML IV SOLN
10.0000 meq | INTRAVENOUS | Status: AC
  Administered 2020-07-24 (×3): 10 meq via INTRAVENOUS
  Filled 2020-07-24 (×3): qty 100

## 2020-07-24 MED ORDER — CALCIUM GLUCONATE-NACL 1-0.675 GM/50ML-% IV SOLN
1.0000 g | Freq: Once | INTRAVENOUS | Status: AC
  Administered 2020-07-24: 1000 mg via INTRAVENOUS
  Filled 2020-07-24: qty 50

## 2020-07-24 MED ORDER — DARBEPOETIN ALFA 150 MCG/0.3ML IJ SOSY
150.0000 ug | PREFILLED_SYRINGE | INTRAMUSCULAR | Status: DC
  Filled 2020-07-24 (×2): qty 0.3

## 2020-07-24 NOTE — Progress Notes (Signed)
[facility] ROUNDING NOTE   Subjective:   Brief history 64 year old lady with history of diabetes hypertension gastroparesis stroke CKD stage III with nausea vomiting decreased oral intake.  She has a history of gastroparesis followed by GI.  Longstanding history of CKD with recent progression baseline serum creatinine by 3.44 mg/dL does not have a nephrologist but recently referred to [facility].  On arrival in the emergency room blood pressure was elevated BUN 138 creatinine elevated to 12.49.  She is not on ACE inhibitor or ARB at denies NSAID use.  She appeared clinically dehydrated and was placed on IV sodium bicarbonate 150 cc an hour.  Renal ultrasound did not show any evidence of hydronephrosis but diffuse increased cortical echogenicity.  Sodium 142 potassium 3.5 chloride 106 CO2 16 BUN 122 creatinine 11.34 calcium 5.9 phosphorus greater than 30 hemoglobin 8.2 iron saturations 41%  No recorded urine output on chart  Objective:  Vital signs in last 24 hours:  Temp:  [97.9 F (36.6 C)-98.4 F (36.9 C)] 97.9 F (36.6 C) (11/15 0600) Pulse Rate:  [63-76] 64 (11/15 0600) Resp:  [7-26] 13 (11/15 0600) BP: (147-195)/(53-86) 193/57 (11/15 0600) SpO2:  [89 %-100 %] 97 % (11/15 0600)  Weight change:  Filed Weights   07/23/20 0213  Weight: 85 kg    Intake/Output: No intake/output data recorded.   Intake/Output this shift:  No intake/output data recorded.  CVS- RRR RS- CTA ABD- BS present soft non-distended EXT- no edema   Basic Metabolic Panel: Recent Labs  Lab 07/23/20 0239 07/23/20 1110 07/24/20 0343  NA 141  --  142  K 4.2  --  3.5  CL 109  --  106  CO2 11*  --  16*  GLUCOSE 94  --  72  BUN 138*  --  122*  CREATININE 12.49* 11.80* 11.34*  CALCIUM 5.8*  --  5.9*  PHOS  --   --  >30.0*    Liver Function Tests: Recent Labs  Lab 07/23/20 0239  AST 8*  ALT 6  ALKPHOS 82  BILITOT 0.3  PROT 6.1*  ALBUMIN 2.0*   Recent Labs   Lab 07/23/20 0239  LIPASE 52*   No results for input(s): AMMONIA in the last 168 hours.  CBC: Recent Labs  Lab 07/23/20 0239 07/23/20 1110 07/24/20 0343  WBC 10.8* 10.3 6.8  HGB 9.1* 8.3* 8.2*  HCT 29.7* 27.1* 26.3*  MCV 90.0 90.0 88.3  PLT 359 320 322    Cardiac Enzymes: No results for input(s): CKTOTAL, CKMB, CKMBINDEX, TROPONINI in the last 168 hours.  BNP: Invalid input(s): POCBNP  CBG: No results for input(s): GLUCAP in the last 168 hours.  Microbiology: Results for orders placed or performed during the hospital encounter of 07/23/20  Respiratory Panel by RT PCR (Flu A&B, Covid) - Nasopharyngeal Swab     Status: None   Collection Time: 07/23/20  6:20 AM   Specimen: Nasopharyngeal Swab  Result Value Ref Range Status   SARS Coronavirus 2 by RT PCR NEGATIVE NEGATIVE Final    Comment: (NOTE) SARS-CoV-2 target nucleic acids are NOT DETECTED.  The SARS-CoV-2 RNA is generally detectable in upper respiratoy specimens during the acute phase of infection. The lowest concentration of SARS-CoV-2 viral copies this assay can detect is 131 copies/mL. A negative result does not preclude SARS-Cov-2 infection and should not be used as the sole basis for treatment or other patient management decisions. A negative result may occur with  improper specimen collection/handling, submission of  specimen other than nasopharyngeal swab, presence of viral mutation(s) within the areas targeted by this assay, and inadequate number of viral copies (<131 copies/mL). A negative result must be combined with clinical observations, patient history, and epidemiological information. The expected result is Negative.  Fact Sheet for Patients:  PinkCheek.be  Fact Sheet for Healthcare Providers:  GravelBags.it  This test is no t yet approved or cleared by the [place] FDA and  has been authorized for detection and/or diagnosis of  SARS-CoV-2 by FDA under an Emergency Use Authorization (EUA). This EUA will remain  in effect (meaning this test can be used) for the duration of the COVID-19 declaration under Section 564(b)(1) of the Act, 21 U.S.C. section 360bbb-3(b)(1), unless the authorization is terminated or revoked sooner.     Influenza A by PCR NEGATIVE NEGATIVE Final   Influenza B by PCR NEGATIVE NEGATIVE Final    Comment: (NOTE) The Xpert Xpress SARS-CoV-2/FLU/RSV assay is intended as an aid in  the diagnosis of influenza from Nasopharyngeal swab specimens and  should not be used as a sole basis for treatment. Nasal washings and  aspirates are unacceptable for Xpert Xpress SARS-CoV-2/FLU/RSV  testing.  Fact Sheet for Patients: PinkCheek.be  Fact Sheet for Healthcare Providers: GravelBags.it  This test is not yet approved or cleared by the [place] FDA and  has been authorized for detection and/or diagnosis of SARS-CoV-2 by  FDA under an Emergency Use Authorization (EUA). This EUA will remain  in effect (meaning this test can be used) for the duration of the  Covid-19 declaration under Section 564(b)(1) of the Act, 21  U.S.C. section 360bbb-3(b)(1), unless the authorization is  terminated or revoked. Performed at [facility], [place] [address], [place], [place] [address]     Coagulation Studies: No results for input(s): LABPROT, INR in the last 72 hours.  Urinalysis: Recent Labs    07/23/20 0146  COLORURINE YELLOW  LABSPEC 1.010  PHURINE 5.0  GLUCOSEU 50*  HGBUR MODERATE*  BILIRUBINUR NEGATIVE  KETONESUR NEGATIVE  PROTEINUR >=300*  NITRITE NEGATIVE  LEUKOCYTESUR NEGATIVE      Imaging: US RENAL  Result Date: 07/23/2020 CLINICAL DATA:  ACI EXAM: RENAL / URINARY TRACT ULTRASOUND COMPLETE COMPARISON:  September fifteenth, 2021 FINDINGS: Right Kidney: Renal measurements: 12.4 x 5.6 x 5.6 cm = volume: 203 mL. Echogenicity  is diffusely increased. No mass or hydronephrosis visualized. Left Kidney: Renal measurements: 13.5 x 5.6 x 5.5 cm = volume: 215 mL. Echogenicity is diffusely increased. No hydronephrosis. In the superior pole, there is an anechoic mass with posterior acoustic enhancement consistent with a simple cyst which measures 1.7 x 1.3 x 1.0 cm. Bladder: Decompressed and not visualized. Other: None. IMPRESSION: 1. Diffusely increased renal cortical echogenicity as can be seen in medical renal disease. No hydronephrosis. Electronically Signed   By: Valentino Saxon MD   On: 07/23/2020 14:47   VAS [place] LOWER EXTREMITY VENOUS (DVT) (ONLY [facility] & [facility])  Result Date: 07/23/2020  Lower Venous DVT Study Indications: Palpable cord.  Risk Factors: None identified. Comparison Study: No prior studies. Performing Technologist: Oliver Hum RVT  Examination Guidelines: A complete evaluation includes B-mode imaging, spectral Doppler, color Doppler, and power Doppler as needed of all accessible portions of each vessel. Bilateral testing is considered an integral part of a complete examination. Limited examinations for reoccurring indications may be performed as noted. The reflux portion of the exam is performed with the patient in reverse Trendelenburg.  +-----+---------------+---------+-----------+----------+--------------+ RIGHTCompressibilityPhasicitySpontaneityPropertiesThrombus Aging +-----+---------------+---------+-----------+----------+--------------+ CFV  Full           Yes      Yes                                 +-----+---------------+---------+-----------+----------+--------------+   +---------+---------------+---------+-----------+----------+--------------+ LEFT     CompressibilityPhasicitySpontaneityPropertiesThrombus Aging +---------+---------------+---------+-----------+----------+--------------+ CFV      Full           Yes      Yes                                  +---------+---------------+---------+-----------+----------+--------------+ SFJ      Full                                                        +---------+---------------+---------+-----------+----------+--------------+ FV Prox  Full                                                        +---------+---------------+---------+-----------+----------+--------------+ FV Mid   Full                                                        +---------+---------------+---------+-----------+----------+--------------+ FV DistalFull                                                        +---------+---------------+---------+-----------+----------+--------------+ PFV      Full                                                        +---------+---------------+---------+-----------+----------+--------------+ POP      Full           Yes      Yes                                 +---------+---------------+---------+-----------+----------+--------------+ PTV      Full                                                        +---------+---------------+---------+-----------+----------+--------------+ PERO     Full                                                        +---------+---------------+---------+-----------+----------+--------------+  Gastroc  Full                                                        +---------+---------------+---------+-----------+----------+--------------+ GSV      Full                                                        +---------+---------------+---------+-----------+----------+--------------+ SSV      Full                                                        +---------+---------------+---------+-----------+----------+--------------+     Summary: RIGHT: - No evidence of common femoral vein obstruction.  LEFT: - There is no evidence of deep vein thrombosis in the lower extremity.  - No cystic structure found in the popliteal fossa.   *See table(s) above for measurements and observations.    Preliminary      Medications:   . ondansetron (ZOFRAN) IV    .  sodium bicarbonate (isotonic) infusion in sterile water 150 mL/hr at 07/23/20 2312   . aspirin  325 mg Oral Daily  . atorvastatin  40 mg Oral QPM  . calcium carbonate  1 tablet Oral BID WC  . carvedilol  12.5 mg Oral BID WC  . DULoxetine  60 mg Oral Daily  . heparin  5,000 Units Subcutaneous Q8H  . nicotine  21 mg Transdermal Daily  . pantoprazole (PROTONIX) IV  40 mg Intravenous Q12H   acetaminophen **OR** acetaminophen, metoCLOPramide (REGLAN) injection, ondansetron **OR** ondansetron (ZOFRAN) IV, promethazine  Assessment/ Plan:  1. Acute kidney injury on CKD IV/V due to severe dehydration caused by intractable nausea, vomiting and decreased oral intake.  She is at high risk of worsening renal failure.  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  Looks dry on physical exam.   Renal ultrasound showed no evidence of hydronephrosis.  We will continue IV fluids to see if there is much improvement in renal function if not then we will need to initiate dialysis.  2.Intractable nausea vomiting due to gastroparesis: She has a history of diabetes.  Followed by GI as outpatient.  Continue supportive care, antiemetics per primary team.  3 Anion gap metabolic acidosis due to renal failure: Starting IV sodium bicarbonate.  Seems to be improving slowly  4.Anemia of CKD, chronic illness: Check iron studies.  Monitor hemoglobin.  Will start ESA  5.CKD MBD, hypercalcemia: Check PTH level.  Calcium level is really low therefore I will order IV calcium and start Os-Cal twice daily.  Check phosphorus level.  6.Hypertension: On carvedilol.  Consider adding amlodipine 5 mg if  BP remains elevated.    LOS: [place] @TODAY @8 :29 AM

## 2020-07-24 NOTE — ED Notes (Signed)
Pt vomited. Gave phenergan and held Calcium oral meds. Dr. Ree Kida aware

## 2020-07-24 NOTE — Progress Notes (Signed)
PROGRESS NOTE    Tina Gomez  TGY:[id number] DOB: [birth year] DOA: 07/23/2020 PCP: Charlott Rakes, MD   Brief Narrative:  HPI on 07/23/2020 by Dr. Kyra Searles Tina Gomez is an 64 y.o. 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  History is per patient and her daughter who is at bedside.   Patient states she did relatively wel after discharge on 06/01/2020 for about a month and subsequently started having intermittent nausea couple of days a week.  Over the past week patient has had marked increase in her nausea and vomiting and has not had a FULL meal for about a week.  She also notes that even trying to drink water has been difficult due to vomiting.  She notes she has been thirsty for the last couple of days.  Over the past 3 days patient has had almost continuous nausea and vomiting unable to be controlled with her usual antinausea medications.  Patient denies any fevers or chills.  No abdominal pain.  No blood in the vomitus.  No melena or hematochezia.  No diarrhea.  Patient admits that she has very poor exercise tolerance and mostly is in bed but this is not anything new.  Denies any new shortness of breath or chest pain.  Patient specifically denies any orthopnea or PND.  She also specifically denies any history of CHF or pulmonary edema.  Denies CAD or MI in past.  Interim history Admitted with acute kidney injury, nephrology consulted and following. Assessment & Plan   Acute kidney injury on chronic kidney disease, stage IV-V -Likely secondary to severe dehydration from intractable nausea and vomiting as well as poor oral intake -Creatinine on admission 12.49, down to 11.34  -Nephrology consulted and appreciated, discussed with Dr. Justin Mend and recommended continuing IV fluids and  monitoring creatinine.  If no improvement, may need to initiate dialysis -Renal ultrasound showed no hydronephrosis -Continue to monitor BMP  Anion gap metabolic acidosis -Likely secondary to renal failure -Continue IV sodium bicarb and monitor BMP  Intractable nausea and vomiting -Given prolonged QTC, will discontinue Zofran and Reglan, continue Phenergan  -Will continue to supplement potassium and obtain magnesium level  Anemia of chronic disease -Hemoglobin 8.2, continue to monitor CBC -Nephrology ordering ESA  Hypocalcemia -replaced, will continue to replace and monitor -currently due to N/V, she has not been taking the oral supplementation  Hyperphosphatemia -Phosphate level over 30 -Discussed with nephrology, likely chronic component -Will continue to monitor, may need phosphate binders  Diabetes mellitus, type II -Appears to be diet controlled -Continue to monitor CBGs, if elevated, will start on insulin sliding scale  Essential hypertension -Continue Coreg  GERD -Continue PPI  Anxiety/depression -Continue duloxetine  History of CVA -Continue aspirin, statin  COPD -Currently stable and compensated -Continue bronchodilators  Tobacco abuse -Cessation discussed, continue nicotine patch  ?  Superficial thrombophlebitis of the lower extremity -Lower extremity Doppler negative for DVT  DVT Prophylaxis  heparin  Code Status: Full  Family Communication: None at bedside  Disposition Plan:  Status is: Inpatient  Remains inpatient appropriate because:Persistent severe electrolyte disturbances, IV treatments appropriate due to intensity of illness or inability to take PO and Inpatient level of care appropriate due to severity of illness   Dispo: The patient is from: Home  Anticipated d/c is to: Home              Anticipated d/c date is: > 3 days              Patient currently is not medically stable to  d/c.  Consultants Nephrology  Procedures  Renal US LE doppler  Antibiotics   Anti-infectives (From admission, onward)   None      Subjective:   Tina Gomez seen and examined today.  Patient continues to have some nausea but states her Phenergan has helped her.  Denies current chest pain or shortness of breath, abdominal pain, dizziness or headache.  Objective:   Vitals:   07/24/20 0400 07/24/20 0600 07/24/20 0800 07/24/20 1001  BP: (!) 180/77 (!) 193/57 (!) 183/69 (!) 173/73  Pulse: 65 64 68   Resp: 11 13 18 20   Temp:  97.9 F (36.6 C)    TempSrc:  Oral    SpO2: 94% 97% 96%   Weight:      Height:       No intake or output data in the 24 hours ending 07/24/20 1303 Filed Weights   07/23/20 0213  Weight: 85 kg    Exam  General: Well developed, chronically ill-appearing, NAD  HEENT: NCAT, mucous membranes moist.   Cardiovascular: S1 S2 auscultated, RRR  Respiratory: Clear to auscultation bilaterally with equal chest rise  Abdomen: Soft, nontender, nondistended, + bowel sounds  Extremities: warm dry without cyanosis clubbing.  Lower extremity edema  Neuro: AAOx3, nonfocal  Psych: appropriate mood and affect, pleasant   Data Reviewed: I have personally reviewed following labs and imaging studies  CBC: Recent Labs  Lab 07/23/20 0239 07/23/20 1110 07/24/20 0343  WBC 10.8* 10.3 6.8  HGB 9.1* 8.3* 8.2*  HCT 29.7* 27.1* 26.3*  MCV 90.0 90.0 88.3  PLT 359 320 893   Basic Metabolic Panel: Recent Labs  Lab 07/23/20 0239 07/23/20 1110 07/24/20 0343  NA 141  --  142  K 4.2  --  3.5  CL 109  --  106  CO2 11*  --  16*  GLUCOSE 94  --  72  BUN 138*  --  122*  CREATININE 12.49* 11.80* 11.34*  CALCIUM 5.8*  --  5.9*  PHOS  --   --  >30.0*   GFR: Estimated Creatinine Clearance: 5.1 mL/min (A) (by C-G formula based on SCr of 11.34 mg/dL (H)). Liver Function Tests: Recent Labs  Lab 07/23/20 0239  AST 8*  ALT 6  ALKPHOS 82  BILITOT 0.3  PROT  6.1*  ALBUMIN 2.0*   Recent Labs  Lab 07/23/20 0239  LIPASE 52*   No results for input(s): AMMONIA in the last 168 hours. Coagulation Profile: No results for input(s): INR, PROTIME in the last 168 hours. Cardiac Enzymes: Recent Labs  Lab 07/24/20 0343  CKTOTAL 47   BNP (last 3 results) No results for input(s): PROBNP in the last 8760 hours. HbA1C: No results for input(s): HGBA1C in the last 72 hours. CBG: No results for input(s): GLUCAP in the last 168 hours. Lipid Profile: No results for input(s): CHOL, HDL, LDLCALC, TRIG, CHOLHDL, LDLDIRECT in the last 72 hours. Thyroid Function Tests: No results for input(s): TSH, T4TOTAL, FREET4, T3FREE, THYROIDAB in the last 72 hours. Anemia Panel: Recent Labs    07/24/20 0343  FERRITIN 201  TIBC 140*  IRON 58   Urine analysis:    Component Value Date/Time   COLORURINE YELLOW 07/23/2020 0146   APPEARANCEUR CLOUDY (  A) 07/23/2020 0146   LABSPEC 1.010 07/23/2020 0146   PHURINE 5.0 07/23/2020 0146   GLUCOSEU 50 (A) 07/23/2020 0146   HGBUR MODERATE (A) 07/23/2020 0146   BILIRUBINUR NEGATIVE 07/23/2020 0146   KETONESUR NEGATIVE 07/23/2020 0146   PROTEINUR >=300 (A) 07/23/2020 0146   UROBILINOGEN 1.0 10/22/2013 1626   NITRITE NEGATIVE 07/23/2020 0146   LEUKOCYTESUR NEGATIVE 07/23/2020 0146   Sepsis Labs: @LABRCNTIP (procalcitonin:4,lacticidven:4)  ) Recent Results (from the past 240 hour(s))  Respiratory Panel by RT PCR (Flu A&B, Covid) - Nasopharyngeal Swab     Status: None   Collection Time: 07/23/20  6:20 AM   Specimen: Nasopharyngeal Swab  Result Value Ref Range Status   SARS Coronavirus 2 by RT PCR NEGATIVE NEGATIVE Final    Comment: (NOTE) SARS-CoV-2 target nucleic acids are NOT DETECTED.  The SARS-CoV-2 RNA is generally detectable in upper respiratoy specimens during the acute phase of infection. The lowest concentration of SARS-CoV-2 viral copies this assay can detect is 131 copies/mL. A negative result does not  preclude SARS-Cov-2 infection and should not be used as the sole basis for treatment or other patient management decisions. A negative result may occur with  improper specimen collection/handling, submission of specimen other than nasopharyngeal swab, presence of viral mutation(s) within the areas targeted by this assay, and inadequate number of viral copies (<131 copies/mL). A negative result must be combined with clinical observations, patient history, and epidemiological information. The expected result is Negative.  Fact Sheet for Patients:  PinkCheek.be  Fact Sheet for Healthcare Providers:  GravelBags.it  This test is no t yet approved or cleared by the [place] FDA and  has been authorized for detection and/or diagnosis of SARS-CoV-2 by FDA under an Emergency Use Authorization (EUA). This EUA will remain  in effect (meaning this test can be used) for the duration of the COVID-19 declaration under Section 564(b)(1) of the Act, 21 U.S.C. section 360bbb-3(b)(1), unless the authorization is terminated or revoked sooner.     Influenza A by PCR NEGATIVE NEGATIVE Final   Influenza B by PCR NEGATIVE NEGATIVE Final    Comment: (NOTE) The Xpert Xpress SARS-CoV-2/FLU/RSV assay is intended as an aid in  the diagnosis of influenza from Nasopharyngeal swab specimens and  should not be used as a sole basis for treatment. Nasal washings and  aspirates are unacceptable for Xpert Xpress SARS-CoV-2/FLU/RSV  testing.  Fact Sheet for Patients: PinkCheek.be  Fact Sheet for Healthcare Providers: GravelBags.it  This test is not yet approved or cleared by the [place] FDA and  has been authorized for detection and/or diagnosis of SARS-CoV-2 by  FDA under an Emergency Use Authorization (EUA). This EUA will remain  in effect (meaning this test can be used) for the  duration of the  Covid-19 declaration under Section 564(b)(1) of the Act, 21  U.S.C. section 360bbb-3(b)(1), unless the authorization is  terminated or revoked. Performed at [facility], [place] [address], [place], [place] [address]       Radiology Studies: US RENAL  Result Date: 07/23/2020 CLINICAL DATA:  ACI EXAM: RENAL / URINARY TRACT ULTRASOUND COMPLETE COMPARISON:  September fifteenth, 2021 FINDINGS: Right Kidney: Renal measurements: 12.4 x 5.6 x 5.6 cm = volume: 203 mL. Echogenicity is diffusely increased. No mass or hydronephrosis visualized. Left Kidney: Renal measurements: 13.5 x 5.6 x 5.5 cm = volume: 215 mL. Echogenicity is diffusely increased. No hydronephrosis. In the superior pole, there is an anechoic mass with posterior acoustic enhancement consistent with a simple  cyst which measures 1.7 x 1.3 x 1.0 cm. Bladder: Decompressed and not visualized. Other: None. IMPRESSION: 1. Diffusely increased renal cortical echogenicity as can be seen in medical renal disease. No hydronephrosis. Electronically Signed   By: Valentino Saxon MD   On: 07/23/2020 14:47   VAS [place] LOWER EXTREMITY VENOUS (DVT) (ONLY [facility] & [facility])  Result Date: 07/23/2020  Lower Venous DVT Study Indications: Palpable cord.  Risk Factors: None identified. Comparison Study: No prior studies. Performing Technologist: Oliver Hum RVT  Examination Guidelines: A complete evaluation includes B-mode imaging, spectral Doppler, color Doppler, and power Doppler as needed of all accessible portions of each vessel. Bilateral testing is considered an integral part of a complete examination. Limited examinations for reoccurring indications may be performed as noted. The reflux portion of the exam is performed with the patient in reverse Trendelenburg.  +-----+---------------+---------+-----------+----------+--------------+ RIGHTCompressibilityPhasicitySpontaneityPropertiesThrombus Aging  +-----+---------------+---------+-----------+----------+--------------+ CFV  Full           Yes      Yes                                 +-----+---------------+---------+-----------+----------+--------------+   +---------+---------------+---------+-----------+----------+--------------+ LEFT     CompressibilityPhasicitySpontaneityPropertiesThrombus Aging +---------+---------------+---------+-----------+----------+--------------+ CFV      Full           Yes      Yes                                 +---------+---------------+---------+-----------+----------+--------------+ SFJ      Full                                                        +---------+---------------+---------+-----------+----------+--------------+ FV Prox  Full                                                        +---------+---------------+---------+-----------+----------+--------------+ FV Mid   Full                                                        +---------+---------------+---------+-----------+----------+--------------+ FV DistalFull                                                        +---------+---------------+---------+-----------+----------+--------------+ PFV      Full                                                        +---------+---------------+---------+-----------+----------+--------------+ POP      Full           Yes  Yes                                 +---------+---------------+---------+-----------+----------+--------------+ PTV      Full                                                        +---------+---------------+---------+-----------+----------+--------------+ PERO     Full                                                        +---------+---------------+---------+-----------+----------+--------------+ Gastroc  Full                                                         +---------+---------------+---------+-----------+----------+--------------+ GSV      Full                                                        +---------+---------------+---------+-----------+----------+--------------+ SSV      Full                                                        +---------+---------------+---------+-----------+----------+--------------+     Summary: RIGHT: - No evidence of common femoral vein obstruction.  LEFT: - There is no evidence of deep vein thrombosis in the lower extremity.  - No cystic structure found in the popliteal fossa.  *See table(s) above for measurements and observations.    Preliminary      Scheduled Meds: . aspirin  325 mg Oral Daily  . atorvastatin  40 mg Oral QPM  . calcium carbonate  1 tablet Oral BID WC  . carvedilol  12.5 mg Oral BID WC  . darbepoetin (ARANESP) injection - NON-DIALYSIS  150 mcg Subcutaneous Q Mon-1800  . DULoxetine  60 mg Oral Daily  . heparin  5,000 Units Subcutaneous Q8H  . nicotine  21 mg Transdermal Daily  . pantoprazole (PROTONIX) IV  40 mg Intravenous Q12H   Continuous Infusions: . potassium chloride 10 mEq (07/24/20 1229)  .  sodium bicarbonate (isotonic) infusion in sterile water 150 mL/hr at 07/23/20 2312     LOS: 1 day   Time Spent in minutes   45 minutes  Tina Gomez D.O. on 07/24/2020 at 1:03 PM  Between 7am to 7pm - Please see pager noted on [email or web address]  After 7pm go to [email or web address]  And look for the night coverage person covering for me after hours  [facility] Office  [phone]

## 2020-07-24 NOTE — Progress Notes (Signed)
Lab calling with a critical lab result- calcium 6.0, Dr. Ree Kida notified.

## 2020-07-25 ENCOUNTER — Inpatient Hospital Stay (HOSPITAL_COMMUNITY): Payer: Medicaid Other

## 2020-07-25 HISTORY — PX: IR US GUIDE VASC ACCESS RIGHT: IMG2390

## 2020-07-25 HISTORY — PX: IR FLUORO GUIDE CV LINE RIGHT: IMG2283

## 2020-07-25 LAB — CBC
HCT: 25.2 % — ABNORMAL LOW (ref 36.0–46.0)
HCT: 27.5 % — ABNORMAL LOW (ref 36.0–46.0)
Hemoglobin: 8.3 g/dL — ABNORMAL LOW (ref 12.0–15.0)
Hemoglobin: 9.2 g/dL — ABNORMAL LOW (ref 12.0–15.0)
MCH: 27.9 pg (ref 26.0–34.0)
MCH: 28.7 pg (ref 26.0–34.0)
MCHC: 32.9 g/dL (ref 30.0–36.0)
MCHC: 33.5 g/dL (ref 30.0–36.0)
MCV: 84.6 fL (ref 80.0–100.0)
MCV: 85.7 fL (ref 80.0–100.0)
Platelets: 303 10*3/uL (ref 150–400)
Platelets: 341 10*3/uL (ref 150–400)
RBC: 2.98 MIL/uL — ABNORMAL LOW (ref 3.87–5.11)
RBC: 3.21 MIL/uL — ABNORMAL LOW (ref 3.87–5.11)
RDW: 14.8 % (ref 11.5–15.5)
RDW: 14.6 % (ref 11.5–15.5)
WBC: 7.2 10*3/uL (ref 4.0–10.5)
WBC: 7.1 10*3/uL (ref 4.0–10.5)
nRBC: 0 % (ref 0.0–0.2)
nRBC: 0 % (ref 0.0–0.2)

## 2020-07-25 LAB — RENAL FUNCTION PANEL
Albumin: 1.8 g/dL — ABNORMAL LOW (ref 3.5–5.0)
Anion gap: 24 — ABNORMAL HIGH (ref 5–15)
BUN: 113 mg/dL — ABNORMAL HIGH (ref 8–23)
CO2: 24 mmol/L (ref 22–32)
Calcium: 5.6 mg/dL — CL (ref 8.9–10.3)
Chloride: 92 mmol/L — ABNORMAL LOW (ref 98–111)
Creatinine, Ser: 10.51 mg/dL — ABNORMAL HIGH (ref 0.44–1.00)
GFR, Estimated: 4 mL/min — ABNORMAL LOW (ref >60)
Glucose, Bld: 71 mg/dL (ref 70–99)
Phosphorus: >30 mg/dL — ABNORMAL HIGH (ref 2.5–4.6)
Potassium: 3 mmol/L — ABNORMAL LOW (ref 3.5–5.1)
Sodium: 140 mmol/L (ref 135–145)

## 2020-07-25 LAB — PTH, INTACT AND CALCIUM
Calcium, Total (PTH): 5.8 mg/dL — CL (ref 8.7–10.3)
PTH: 81 pg/mL — ABNORMAL HIGH (ref 15–65)

## 2020-07-25 LAB — MAGNESIUM: Magnesium: 1.8 mg/dL (ref 1.7–2.4)

## 2020-07-25 MED ORDER — LIDOCAINE HCL 1 % IJ SOLN
INTRAMUSCULAR | Status: AC
  Filled 2020-07-25: qty 20

## 2020-07-25 MED ORDER — HEPARIN SODIUM (PORCINE) 1000 UNIT/ML DIALYSIS
1000.0000 [IU] | INTRAMUSCULAR | Status: DC | PRN
  Filled 2020-07-25: qty 1

## 2020-07-25 MED ORDER — ALTEPLASE 2 MG IJ SOLR
2.0000 mg | Freq: Once | INTRAMUSCULAR | Status: DC | PRN
  Filled 2020-07-25: qty 2

## 2020-07-25 MED ORDER — SODIUM CHLORIDE 0.9 % IV SOLN: 100.0000 mL | INTRAVENOUS | Status: DC | PRN

## 2020-07-25 MED ORDER — LIDOCAINE HCL (PF) 1 % IJ SOLN: 5.0000 mL | INTRAMUSCULAR | Status: DC | PRN

## 2020-07-25 MED ORDER — PENTAFLUOROPROP-TETRAFLUOROETH EX AERO: 1.0000 "application " | INHALATION_SPRAY | CUTANEOUS | Status: DC | PRN

## 2020-07-25 MED ORDER — HYDRALAZINE HCL 25 MG PO TABS
25.0000 mg | ORAL_TABLET | Freq: Four times a day (QID) | ORAL | Status: DC | PRN
  Administered 2020-07-25 – 2020-07-27 (×2): 25 mg via ORAL
  Filled 2020-07-25 (×3): qty 1

## 2020-07-25 MED ORDER — AMLODIPINE BESYLATE 10 MG PO TABS
10.0000 mg | ORAL_TABLET | Freq: Every day | ORAL | Status: DC
  Administered 2020-07-25 – 2020-07-31 (×6): 10 mg via ORAL
  Filled 2020-07-25 (×7): qty 1

## 2020-07-25 MED ORDER — LIDOCAINE-PRILOCAINE 2.5-2.5 % EX CREA
1.0000 "application " | TOPICAL_CREAM | CUTANEOUS | Status: DC | PRN
  Filled 2020-07-25: qty 5

## 2020-07-25 MED ORDER — LIDOCAINE HCL (PF) 1 % IJ SOLN
INTRAMUSCULAR | Status: DC | PRN
  Administered 2020-07-25: 10 mL

## 2020-07-25 MED ORDER — CHLORHEXIDINE GLUCONATE CLOTH 2 % EX PADS
6.0000 | MEDICATED_PAD | Freq: Every day | CUTANEOUS | Status: DC
  Administered 2020-07-25 – 2020-07-29 (×5): 6 via TOPICAL

## 2020-07-25 MED ORDER — CALCIUM ACETATE (PHOS BINDER) 667 MG PO CAPS
2001.0000 mg | ORAL_CAPSULE | Freq: Three times a day (TID) | ORAL | Status: DC
  Administered 2020-07-25 – 2020-07-31 (×11): 2001 mg via ORAL
  Filled 2020-07-25 (×14): qty 3

## 2020-07-25 MED ORDER — METOCLOPRAMIDE HCL 5 MG/ML IJ SOLN
5.0000 mg | Freq: Four times a day (QID) | INTRAMUSCULAR | Status: DC | PRN
  Administered 2020-07-25: 5 mg via INTRAVENOUS
  Filled 2020-07-25: qty 2

## 2020-07-25 MED ORDER — HEPARIN SODIUM (PORCINE) 1000 UNIT/ML IJ SOLN
INTRAMUSCULAR | Status: AC
  Administered 2020-07-25: 2.6 mL
  Filled 2020-07-25: qty 1

## 2020-07-25 NOTE — Progress Notes (Signed)
PROGRESS NOTE    Tina Gomez  JSE:[id number] DOB: 1955-09-20 DOA: 07/23/2020 PCP: Charlott Rakes, MD   Brief Narrative:  HPI on 07/23/2020 by Dr. Kyra Searles Tina Gomez is an 64 y.o. 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  History is per patient and her daughter who is at bedside.   Patient states she did relatively wel after discharge on 06/01/2020 for about a month and subsequently started having intermittent nausea couple of days a week.  Over the past week patient has had marked increase in her nausea and vomiting and has not had a FULL meal for about a week.  She also notes that even trying to drink water has been difficult due to vomiting.  She notes she has been thirsty for the last couple of days.  Over the past 3 days patient has had almost continuous nausea and vomiting unable to be controlled with her usual antinausea medications.  Patient denies any fevers or chills.  No abdominal pain.  No blood in the vomitus.  No melena or hematochezia.  No diarrhea.  Patient admits that she has very poor exercise tolerance and mostly is in bed but this is not anything new.  Denies any new shortness of breath or chest pain.  Patient specifically denies any orthopnea or PND.  She also specifically denies any history of CHF or pulmonary edema.  Denies CAD or MI in past.  Interim history Admitted with acute kidney injury, nephrology consulted and following. IR consulted for HD catheter placement as nephrology wants to start HD. Assessment & Plan   Acute kidney injury on chronic kidney disease, stage IV-V -Likely secondary to severe dehydration from intractable nausea and vomiting as well as poor oral intake -Creatinine on admission 12.49, down to 10.51 -Nephrology consulted and  appreciated, discussed with Dr. Justin Mend today and planning to start HD -Interventional radiology consulted, status post right IJ temp HD cath -Renal ultrasound showed no hydronephrosis -Continue to monitor BMP  Anion gap metabolic acidosis -Likely secondary to renal failure -Improving -Continue IV sodium bicarb -Continue to monitor BMP  Intractable nausea and vomiting -Given prolonged QTC, Discontinued Zofran and Reglan, continue Phenergan  -Magnesium 1.8 -continue to monitor   Anemia of chronic disease -Hemoglobin 8.3, continue to monitor CBC -Nephrology ordering ESA  Hypokalemia -potassium 3 -suspect will be supplemented with HD -continue to monitor BMP  Hypocalcemia -Continue to replace and monitor -currently due to N/V, she has not been taking the oral supplementation  Hyperphosphatemia -Phosphate level over 30 -Discussed with nephrology, likely chronic component -Will continue to monitor, may need phosphate binders  Diabetes mellitus, type II -Appears to be diet controlled -Continue to monitor CBGs, if elevated, will start on insulin sliding scale  Essential hypertension -Continue Coreg  GERD -Continue PPI  Anxiety/depression -Continue duloxetine  History of CVA -Continue aspirin, statin  COPD -Currently stable and compensated -Continue bronchodilators  Tobacco abuse -Cessation discussed, continue nicotine patch  ?  Superficial thrombophlebitis of the lower extremity -Lower extremity Doppler negative for DVT  DVT Prophylaxis  heparin  Code Status: Full  Family Communication: None at bedside  Disposition Plan:  Status is: Inpatient  Remains inpatient appropriate because:Persistent severe electrolyte disturbances, IV treatments appropriate due to intensity of illness or inability to take PO and Inpatient level of care appropriate due to  severity of illness   Dispo: The patient is from: Home              Anticipated d/c is to: Home               Anticipated d/c date is: > 3 days              Patient currently is not medically stable to d/c.  Consultants Nephrology Interventional radiology  Procedures  Renal US LE doppler Right IJ temp HD cath placed  Antibiotics   Anti-infectives (From admission, onward)   None      Subjective:   Tina Gomez seen and examined today.  Patient feels overwhelmed.  Continues to have some nausea but denies vomiting.  Does not have an appetite.  Denies chest pain, shortness of breath, abdominal pain, dizziness or headache.    Objective:   Vitals:   07/24/20 1001 07/24/20 2117 07/25/20 0501 07/25/20 0806  BP: (!) 173/73 (!) 183/56 (!) 190/57 (!) 179/71  Pulse:  61 64 60  Resp: 20 20 18    Temp:  97.8 F (36.6 C) 97.7 F (36.5 C) 97.7 F (36.5 C)  TempSrc:  Oral Oral   SpO2:  96% 95% 95%  Weight:      Height:        Intake/Output Summary (Last 24 hours) at 07/25/2020 1253 Last data filed at 07/25/2020 0710 Gross per 24 hour  Intake 240 ml  Output --  Net 240 ml   Filed Weights   07/23/20 0213  Weight: 85 kg   Exam  General: Well developed, chronically ill-appearing, NAD  HEENT: NCAT,  mucous membranes moist.   Cardiovascular: S1 S2 auscultated, RRR, no murmur  Respiratory: Clear to auscultation bilaterally with equal chest rise  Abdomen: Soft, nontender, nondistended, + bowel sounds  Extremities: warm dry without cyanosis clubbing. Lower extremity edema   Neuro: AAOx3, nonfocal  Psych: Anxious, however appropriate mood and affect   Data Reviewed: I have personally reviewed following labs and imaging studies  CBC: Recent Labs  Lab 07/23/20 0239 07/23/20 1110 07/24/20 0343 07/25/20 0341  WBC 10.8* 10.3 6.8 7.2  HGB 9.1* 8.3* 8.2* 8.3*  HCT 29.7* 27.1* 26.3* 25.2*  MCV 90.0 90.0 88.3 84.6  PLT 359 320 322 009   Basic Metabolic Panel: Recent Labs  Lab 07/23/20 0239 07/23/20 1110 07/24/20 0343 07/24/20 0418 07/25/20 0341  NA 141  --  142 144 140   K 4.2  --  3.5 3.6 3.0*  CL 109  --  106 107 92*  CO2 11*  --  16* 14* 24  GLUCOSE 94  --  72 66* 71  BUN 138*  --  122* 125* 113*  CREATININE 12.49* 11.80* 11.34* 11.32* 10.51*  CALCIUM 5.8*  --  5.9*  5.8* 6.0* 5.6*  MG  --   --   --   --  1.8  PHOS  --   --  >30.0* >30.0* >30.0*   GFR: Estimated Creatinine Clearance: 5.5 mL/min (A) (by C-G formula based on SCr of 10.51 mg/dL (H)). Liver Function Tests: Recent Labs  Lab 07/23/20 0239 07/24/20 0418 07/25/20 0341  AST 8*  --   --   ALT 6  --   --   ALKPHOS 82  --   --   BILITOT 0.3  --   --   PROT 6.1*  --   --   ALBUMIN 2.0* 1.7* 1.8*   Recent Labs  Lab 07/23/20 0239  LIPASE 52*   No results for input(s): AMMONIA in the last 168 hours. Coagulation Profile: No results for input(s): INR, PROTIME in the last 168 hours. Cardiac Enzymes: Recent Labs  Lab 07/24/20 0343  CKTOTAL 47   BNP (last 3 results) No results for input(s): PROBNP in the last 8760 hours. HbA1C: No results for input(s): HGBA1C in the last 72 hours. CBG: No results for input(s): GLUCAP in the last 168 hours. Lipid Profile: No results for input(s): CHOL, HDL, LDLCALC, TRIG, CHOLHDL, LDLDIRECT in the last 72 hours. Thyroid Function Tests: No results for input(s): TSH, T4TOTAL, FREET4, T3FREE, THYROIDAB in the last 72 hours. Anemia Panel: Recent Labs    07/24/20 0343  FERRITIN 201  TIBC 140*  IRON 58   Urine analysis:    Component Value Date/Time   COLORURINE YELLOW 07/23/2020 0146   APPEARANCEUR CLOUDY (A) 07/23/2020 0146   LABSPEC 1.010 07/23/2020 0146   PHURINE 5.0 07/23/2020 0146   GLUCOSEU 50 (A) 07/23/2020 0146   HGBUR MODERATE (A) 07/23/2020 0146   BILIRUBINUR NEGATIVE 07/23/2020 0146   KETONESUR NEGATIVE 07/23/2020 0146   PROTEINUR >=300 (A) 07/23/2020 0146   UROBILINOGEN 1.0 10/22/2013 1626   NITRITE NEGATIVE 07/23/2020 0146   LEUKOCYTESUR NEGATIVE 07/23/2020 0146   Sepsis  Labs: @LABRCNTIP (procalcitonin:4,lacticidven:4)  ) Recent Results (from the past 240 hour(s))  Respiratory Panel by RT PCR (Flu A&B, Covid) - Nasopharyngeal Swab     Status: None   Collection Time: 07/23/20  6:20 AM   Specimen: Nasopharyngeal Swab  Result Value Ref Range Status   SARS Coronavirus 2 by RT PCR NEGATIVE NEGATIVE Final    Comment: (NOTE) SARS-CoV-2 target nucleic acids are NOT DETECTED.  The SARS-CoV-2 RNA is generally detectable in upper respiratoy specimens during the acute phase of infection. The lowest concentration of SARS-CoV-2 viral copies this assay can detect is 131 copies/mL. A negative result does not preclude SARS-Cov-2 infection and should not be used as the sole basis for treatment or other patient management decisions. A negative result may occur with  improper specimen collection/handling, submission of specimen other than nasopharyngeal swab, presence of viral mutation(s) within the areas targeted by this assay, and inadequate number of viral copies (<131 copies/mL). A negative result must be combined with clinical observations, patient history, and epidemiological information. The expected result is Negative.  Fact Sheet for Patients:  PinkCheek.be  Fact Sheet for Healthcare Providers:  GravelBags.it  This test is no t yet approved or cleared by the [place] FDA and  has been authorized for detection and/or diagnosis of SARS-CoV-2 by FDA under an Emergency Use Authorization (EUA). This EUA will remain  in effect (meaning this test can be used) for the duration of the COVID-19 declaration under Section 564(b)(1) of the Act, 21 U.S.C. section 360bbb-3(b)(1), unless the authorization is terminated or revoked sooner.     Influenza A by PCR NEGATIVE NEGATIVE Final   Influenza B by PCR NEGATIVE NEGATIVE Final    Comment: (NOTE) The Xpert Xpress SARS-CoV-2/FLU/RSV assay is intended as an  aid in  the diagnosis of influenza from Nasopharyngeal swab specimens and  should not be used as a sole basis for treatment. Nasal washings and  aspirates are unacceptable for Xpert Xpress SARS-CoV-2/FLU/RSV  testing.  Fact Sheet for Patients: PinkCheek.be  Fact Sheet for Healthcare Providers: GravelBags.it  This test is not yet approved or cleared by the [place] FDA and  has been authorized for detection and/or diagnosis of SARS-CoV-2 by  FDA under an  Emergency Use Authorization (EUA). This EUA will remain  in effect (meaning this test can be used) for the duration of the  Covid-19 declaration under Section 564(b)(1) of the Act, 21  U.S.C. section 360bbb-3(b)(1), unless the authorization is  terminated or revoked. Performed at [facility], [place] [address], [place], [place] [address]       Radiology Studies: US RENAL  Result Date: 07/23/2020 CLINICAL DATA:  ACI EXAM: RENAL / URINARY TRACT ULTRASOUND COMPLETE COMPARISON:  September fifteenth, 2021 FINDINGS: Right Kidney: Renal measurements: 12.4 x 5.6 x 5.6 cm = volume: 203 mL. Echogenicity is diffusely increased. No mass or hydronephrosis visualized. Left Kidney: Renal measurements: 13.5 x 5.6 x 5.5 cm = volume: 215 mL. Echogenicity is diffusely increased. No hydronephrosis. In the superior pole, there is an anechoic mass with posterior acoustic enhancement consistent with a simple cyst which measures 1.7 x 1.3 x 1.0 cm. Bladder: Decompressed and not visualized. Other: None. IMPRESSION: 1. Diffusely increased renal cortical echogenicity as can be seen in medical renal disease. No hydronephrosis. Electronically Signed   By: Valentino Saxon MD   On: 07/23/2020 14:47   IR Fluoro Guide CV Line Right  Result Date: 07/25/2020 INDICATION: Acute kidney injury, access for dialysis EXAM: ULTRASOUND AND FLUOROSCOPIC RIGHT IJ TEMPORARY DIALYSIS CATHETER [facility] CATHETER)  MEDICATIONS: 1% LIDOCAINE LOCAL ANESTHESIA/SEDATION: None. FLUOROSCOPY TIME:  Fluoroscopy Time: 0 minutes 12 seconds (1 mGy). COMPLICATIONS: None immediate. PROCEDURE: Informed written consent was obtained from the patient after a thorough discussion of the procedural risks, benefits and alternatives. All questions were addressed. Maximal Sterile Barrier Technique was utilized including caps, mask, sterile gowns, sterile gloves, sterile drape, hand hygiene and skin antiseptic. A timeout was performed prior to the initiation of the procedure. Under sterile conditions and local anesthesia, ultrasound micropuncture access performed of right internal jugular vein. Images obtained for documentation of the patent right internal jugular vein. 018 guidewire inserted followed by 4 French micro dilator. Amplatz guidewire inserted. Measurements obtained. Tract dilatation performed to insert a 16 cm temporary Mahurkar dialysis catheter. Tip position SVC RA junction. Catheter secured with 2 0 Ethilon sutures and a sterile dressing. Blood aspirated easily followed by saline and heparin flushes. External caps applied. No immediate complication. Patient tolerated the procedure well IMPRESSION: Successful ultrasound fluoroscopic right IJ temporary dialysis catheter (16 cm Mahurkar catheter). Tip SVC RA junction. Ready for use. Electronically Signed   By: Jerilynn Mages.  Shick M.D.   On: 07/25/2020 12:09   IR US Guide Vasc Access Right  Result Date: 07/25/2020 INDICATION: Acute kidney injury, access for dialysis EXAM: ULTRASOUND AND FLUOROSCOPIC RIGHT IJ TEMPORARY DIALYSIS CATHETER [facility] CATHETER) MEDICATIONS: 1% LIDOCAINE LOCAL ANESTHESIA/SEDATION: None. FLUOROSCOPY TIME:  Fluoroscopy Time: 0 minutes 12 seconds (1 mGy). COMPLICATIONS: None immediate. PROCEDURE: Informed written consent was obtained from the patient after a thorough discussion of the procedural risks, benefits and alternatives. All questions were addressed. Maximal  Sterile Barrier Technique was utilized including caps, mask, sterile gowns, sterile gloves, sterile drape, hand hygiene and skin antiseptic. A timeout was performed prior to the initiation of the procedure. Under sterile conditions and local anesthesia, ultrasound micropuncture access performed of right internal jugular vein. Images obtained for documentation of the patent right internal jugular vein. 018 guidewire inserted followed by 4 French micro dilator. Amplatz guidewire inserted. Measurements obtained. Tract dilatation performed to insert a 16 cm temporary Mahurkar dialysis catheter. Tip position SVC RA junction. Catheter secured with 2 0 Ethilon sutures and a sterile dressing. Blood aspirated easily  followed by saline and heparin flushes. External caps applied. No immediate complication. Patient tolerated the procedure well IMPRESSION: Successful ultrasound fluoroscopic right IJ temporary dialysis catheter (16 cm Mahurkar catheter). Tip SVC RA junction. Ready for use. Electronically Signed   By: Jerilynn Mages.  Shick M.D.   On: 07/25/2020 12:09   VAS [place] LOWER EXTREMITY VENOUS (DVT) (ONLY [facility] & [facility])  Result Date: 07/24/2020  Lower Venous DVT Study Indications: Palpable cord.  Risk Factors: None identified. Comparison Study: No prior studies. Performing Technologist: Oliver Hum RVT  Examination Guidelines: A complete evaluation includes B-mode imaging, spectral Doppler, color Doppler, and power Doppler as needed of all accessible portions of each vessel. Bilateral testing is considered an integral part of a complete examination. Limited examinations for reoccurring indications may be performed as noted. The reflux portion of the exam is performed with the patient in reverse Trendelenburg.  +-----+---------------+---------+-----------+----------+--------------+ RIGHTCompressibilityPhasicitySpontaneityPropertiesThrombus Aging +-----+---------------+---------+-----------+----------+--------------+ CFV  Full            Yes      Yes                                 +-----+---------------+---------+-----------+----------+--------------+   +---------+---------------+---------+-----------+----------+--------------+ LEFT     CompressibilityPhasicitySpontaneityPropertiesThrombus Aging +---------+---------------+---------+-----------+----------+--------------+ CFV      Full           Yes      Yes                                 +---------+---------------+---------+-----------+----------+--------------+ SFJ      Full                                                        +---------+---------------+---------+-----------+----------+--------------+ FV Prox  Full                                                        +---------+---------------+---------+-----------+----------+--------------+ FV Mid   Full                                                        +---------+---------------+---------+-----------+----------+--------------+ FV DistalFull                                                        +---------+---------------+---------+-----------+----------+--------------+ PFV      Full                                                        +---------+---------------+---------+-----------+----------+--------------+ POP      Full           Yes  Yes                                 +---------+---------------+---------+-----------+----------+--------------+ PTV      Full                                                        +---------+---------------+---------+-----------+----------+--------------+ PERO     Full                                                        +---------+---------------+---------+-----------+----------+--------------+ Gastroc  Full                                                        +---------+---------------+---------+-----------+----------+--------------+ GSV      Full                                                         +---------+---------------+---------+-----------+----------+--------------+ SSV      Full                                                        +---------+---------------+---------+-----------+----------+--------------+     Summary: RIGHT: - No evidence of common femoral vein obstruction.  LEFT: - There is no evidence of deep vein thrombosis in the lower extremity.  - No cystic structure found in the popliteal fossa.  *See table(s) above for measurements and observations. Electronically signed by Servando Snare MD on 07/24/2020 at 4:33:22 PM.    Final      Scheduled Meds: . amLODipine  10 mg Oral Daily  . aspirin  325 mg Oral Daily  . atorvastatin  40 mg Oral QPM  . calcium acetate  2,001 mg Oral TID WC  . calcium carbonate  1 tablet Oral BID WC  . carvedilol  12.5 mg Oral BID WC  . Chlorhexidine Gluconate Cloth  6 each Topical Q0600  . darbepoetin (ARANESP) injection - NON-DIALYSIS  150 mcg Subcutaneous Q Mon-1800  . DULoxetine  60 mg Oral Daily  . heparin  5,000 Units Subcutaneous Q8H  . lidocaine      . nicotine  21 mg Transdermal Daily  . pantoprazole (PROTONIX) IV  40 mg Intravenous Q12H   Continuous Infusions: .  sodium bicarbonate (isotonic) infusion in sterile water 150 mL/hr at 07/25/20 0542     LOS: 2 days   Time Spent in minutes   45 minutes  Aminat Shelburne D.O. on 07/25/2020 at 12:53 PM  Between 7am to 7pm - Please see pager noted on [email or web address]  After 7pm go to [email or web address]  And look for the night coverage person  covering for me after hours  [facility] Office  [phone]

## 2020-07-25 NOTE — Procedures (Signed)
Interventional Radiology Procedure Note  Procedure: RT IJ TEMP HD CATH    Complications: None  Estimated Blood Loss:  0  Findings: TIP SVC RA    [name] Punt, MD

## 2020-07-25 NOTE — Progress Notes (Signed)
[facility] ROUNDING NOTE   Subjective:   Brief history 64 year old lady with history of diabetes hypertension gastroparesis stroke CKD stage III with nausea vomiting decreased oral intake.  She has a history of gastroparesis followed by GI.  Longstanding history of CKD with recent progression baseline serum creatinine by 3.44 mg/dL does not have a nephrologist but recently referred to [facility].  On arrival in the emergency room blood pressure was elevated BUN 138 creatinine elevated to 12.49.  She is not on ACE inhibitor or ARB at denies NSAID use.  She appeared clinically dehydrated and was placed on IV sodium bicarbonate 150 cc an hour.  Renal ultrasound did not show any evidence of hydronephrosis but diffuse increased cortical echogenicity.   Blood pressure 190/57 pulse 59 temperature 97.7 O2 sats 95% room air  Sodium 144 potassium 3.6 chloride 1047 CO2 of 14 creatinine 11.32 BUN 125 calcium 6 phosphorus greater than 30 magnesium 1.8 albumin 1.7 iron saturation 41%  No recorded urine output on chart  Objective:  Vital signs in last 24 hours:  Temp:  [97.7 F (36.5 C)-97.8 F (36.6 C)] 97.7 F (36.5 C) (11/16 0501) Pulse Rate:  [61-68] 64 (11/16 0501) Resp:  [18-20] 18 (11/16 0501) BP: (173-190)/(56-73) 190/57 (11/16 0501) SpO2:  [95 %-96 %] 95 % (11/16 0501)  Weight change:  Filed Weights   07/23/20 0213  Weight: 85 kg    Intake/Output: No intake/output data recorded.   Intake/Output this shift:  No intake/output data recorded.  General exam: Ill-looking female lying on bed, dry mucous membrane. Respiratory system: Clear to auscultation. Respiratory effort normal. No wheezing or crackle Cardiovascular system: S1 & S2 heard, RRR.  No pedal edema. Gastrointestinal system: Abdomen is nondistended, soft and nontender. Normal bowel sounds heard. Central nervous system: Alert and oriented. No focal neurological deficits. Extremities: Symmetric  5 x 5 power. Skin: No rashes, lesions or ulcers Psychiatry: Judgement and insight appear normal. Mood & affect appropriate   Basic Metabolic Panel: Recent Labs  Lab 07/23/20 0239 07/23/20 1110 07/24/20 0343 07/24/20 0418 07/25/20 0341  NA 141  --  142 144  --   K 4.2  --  3.5 3.6  --   CL 109  --  106 107  --   CO2 11*  --  16* 14*  --   GLUCOSE 94  --  72 66*  --   BUN 138*  --  122* 125*  --   CREATININE 12.49* 11.80* 11.34* 11.32*  --   CALCIUM 5.8*  --  5.9* 6.0*  --   MG  --   --   --   --  1.8  PHOS  --   --  >30.0* >30.0*  --     Liver Function Tests: Recent Labs  Lab 07/23/20 0239 07/24/20 0418  AST 8*  --   ALT 6  --   ALKPHOS 82  --   BILITOT 0.3  --   PROT 6.1*  --   ALBUMIN 2.0* 1.7*   Recent Labs  Lab 07/23/20 0239  LIPASE 52*   No results for input(s): AMMONIA in the last 168 hours.  CBC: Recent Labs  Lab 07/23/20 0239 07/23/20 1110 07/24/20 0343 07/25/20 0341  WBC 10.8* 10.3 6.8 7.2  HGB 9.1* 8.3* 8.2* 8.3*  HCT 29.7* 27.1* 26.3* 25.2*  MCV 90.0 90.0 88.3 84.6  PLT 359 320 322 303    Cardiac Enzymes: Recent Labs  Lab 07/24/20 0343  CKTOTAL 47  BNP: Invalid input(s): POCBNP  CBG: No results for input(s): GLUCAP in the last 168 hours.  Microbiology: Results for orders placed or performed during the hospital encounter of 07/23/20  Respiratory Panel by RT PCR (Flu A&B, Covid) - Nasopharyngeal Swab     Status: None   Collection Time: 07/23/20  6:20 AM   Specimen: Nasopharyngeal Swab  Result Value Ref Range Status   SARS Coronavirus 2 by RT PCR NEGATIVE NEGATIVE Final    Comment: (NOTE) SARS-CoV-2 target nucleic acids are NOT DETECTED.  The SARS-CoV-2 RNA is generally detectable in upper respiratoy specimens during the acute phase of infection. The lowest concentration of SARS-CoV-2 viral copies this assay can detect is 131 copies/mL. A negative result does not preclude SARS-Cov-2 infection and should not be used as the  sole basis for treatment or other patient management decisions. A negative result may occur with  improper specimen collection/handling, submission of specimen other than nasopharyngeal swab, presence of viral mutation(s) within the areas targeted by this assay, and inadequate number of viral copies (<131 copies/mL). A negative result must be combined with clinical observations, patient history, and epidemiological information. The expected result is Negative.  Fact Sheet for Patients:  PinkCheek.be  Fact Sheet for Healthcare Providers:  GravelBags.it  This test is no t yet approved or cleared by the [place] FDA and  has been authorized for detection and/or diagnosis of SARS-CoV-2 by FDA under an Emergency Use Authorization (EUA). This EUA will remain  in effect (meaning this test can be used) for the duration of the COVID-19 declaration under Section 564(b)(1) of the Act, 21 U.S.C. section 360bbb-3(b)(1), unless the authorization is terminated or revoked sooner.     Influenza A by PCR NEGATIVE NEGATIVE Final   Influenza B by PCR NEGATIVE NEGATIVE Final    Comment: (NOTE) The Xpert Xpress SARS-CoV-2/FLU/RSV assay is intended as an aid in  the diagnosis of influenza from Nasopharyngeal swab specimens and  should not be used as a sole basis for treatment. Nasal washings and  aspirates are unacceptable for Xpert Xpress SARS-CoV-2/FLU/RSV  testing.  Fact Sheet for Patients: PinkCheek.be  Fact Sheet for Healthcare Providers: GravelBags.it  This test is not yet approved or cleared by the [place] FDA and  has been authorized for detection and/or diagnosis of SARS-CoV-2 by  FDA under an Emergency Use Authorization (EUA). This EUA will remain  in effect (meaning this test can be used) for the duration of the  Covid-19 declaration under Section 564(b)(1) of  the Act, 21  U.S.C. section 360bbb-3(b)(1), unless the authorization is  terminated or revoked. Performed at [facility], [place] [address], [place], [place] [address]     Coagulation Studies: No results for input(s): LABPROT, INR in the last 72 hours.  Urinalysis: Recent Labs    07/23/20 0146  COLORURINE YELLOW  LABSPEC 1.010  PHURINE 5.0  GLUCOSEU 50*  HGBUR MODERATE*  BILIRUBINUR NEGATIVE  KETONESUR NEGATIVE  PROTEINUR >=300*  NITRITE NEGATIVE  LEUKOCYTESUR NEGATIVE      Imaging: US RENAL  Result Date: 07/23/2020 CLINICAL DATA:  ACI EXAM: RENAL / URINARY TRACT ULTRASOUND COMPLETE COMPARISON:  September fifteenth, 2021 FINDINGS: Right Kidney: Renal measurements: 12.4 x 5.6 x 5.6 cm = volume: 203 mL. Echogenicity is diffusely increased. No mass or hydronephrosis visualized. Left Kidney: Renal measurements: 13.5 x 5.6 x 5.5 cm = volume: 215 mL. Echogenicity is diffusely increased. No hydronephrosis. In the superior pole, there is an anechoic mass with posterior acoustic enhancement  consistent with a simple cyst which measures 1.7 x 1.3 x 1.0 cm. Bladder: Decompressed and not visualized. Other: None. IMPRESSION: 1. Diffusely increased renal cortical echogenicity as can be seen in medical renal disease. No hydronephrosis. Electronically Signed   By: Valentino Saxon MD   On: 07/23/2020 14:47   VAS [place] LOWER EXTREMITY VENOUS (DVT) (ONLY [facility] & [facility])  Result Date: 07/24/2020  Lower Venous DVT Study Indications: Palpable cord.  Risk Factors: None identified. Comparison Study: No prior studies. Performing Technologist: Oliver Hum RVT  Examination Guidelines: A complete evaluation includes B-mode imaging, spectral Doppler, color Doppler, and power Doppler as needed of all accessible portions of each vessel. Bilateral testing is considered an integral part of a complete examination. Limited examinations for reoccurring indications may be performed as noted. The reflux portion of  the exam is performed with the patient in reverse Trendelenburg.  +-----+---------------+---------+-----------+----------+--------------+ RIGHTCompressibilityPhasicitySpontaneityPropertiesThrombus Aging +-----+---------------+---------+-----------+----------+--------------+ CFV  Full           Yes      Yes                                 +-----+---------------+---------+-----------+----------+--------------+   +---------+---------------+---------+-----------+----------+--------------+ LEFT     CompressibilityPhasicitySpontaneityPropertiesThrombus Aging +---------+---------------+---------+-----------+----------+--------------+ CFV      Full           Yes      Yes                                 +---------+---------------+---------+-----------+----------+--------------+ SFJ      Full                                                        +---------+---------------+---------+-----------+----------+--------------+ FV Prox  Full                                                        +---------+---------------+---------+-----------+----------+--------------+ FV Mid   Full                                                        +---------+---------------+---------+-----------+----------+--------------+ FV DistalFull                                                        +---------+---------------+---------+-----------+----------+--------------+ PFV      Full                                                        +---------+---------------+---------+-----------+----------+--------------+ POP      Full           Yes  Yes                                 +---------+---------------+---------+-----------+----------+--------------+ PTV      Full                                                        +---------+---------------+---------+-----------+----------+--------------+ PERO     Full                                                         +---------+---------------+---------+-----------+----------+--------------+ Gastroc  Full                                                        +---------+---------------+---------+-----------+----------+--------------+ GSV      Full                                                        +---------+---------------+---------+-----------+----------+--------------+ SSV      Full                                                        +---------+---------------+---------+-----------+----------+--------------+     Summary: RIGHT: - No evidence of common femoral vein obstruction.  LEFT: - There is no evidence of deep vein thrombosis in the lower extremity.  - No cystic structure found in the popliteal fossa.  *See table(s) above for measurements and observations. Electronically signed by Servando Snare MD on 07/24/2020 at 4:33:22 PM.    Final      Medications:   .  sodium bicarbonate (isotonic) infusion in sterile water 150 mL/hr at 07/25/20 0542   . aspirin  325 mg Oral Daily  . atorvastatin  40 mg Oral QPM  . calcium carbonate  1 tablet Oral BID WC  . carvedilol  12.5 mg Oral BID WC  . darbepoetin (ARANESP) injection - NON-DIALYSIS  150 mcg Subcutaneous Q Mon-1800  . DULoxetine  60 mg Oral Daily  . heparin  5,000 Units Subcutaneous Q8H  . nicotine  21 mg Transdermal Daily  . pantoprazole (PROTONIX) IV  40 mg Intravenous Q12H   acetaminophen **OR** acetaminophen, metoCLOPramide (REGLAN) injection, promethazine  Assessment/ Plan:  1. Acute kidney injury on CKD IV/V due to severe dehydration caused by intractable nausea, vomiting and decreased oral intake.  She is at high risk of worsening renal failure.  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  Looks dry on physical exam.   Renal ultrasound showed no evidence of hydronephrosis.  Not much improvement in renal function I believe we will need to initiate dialysis  2.Intractable nausea vomiting due to gastroparesis: She has  a history of  diabetes.  Followed by GI as outpatient.  Continue supportive care, antiemetics per primary team.  3 Anion gap metabolic acidosis due to renal failure.  Not much return of renal function will have catheter placed and initiate dialysis.  4.Anemia of CKD, chronic illness: Iron sats adequate at 41% continues on darbepoetin 150 mcg weekly  5.CKD MBD,  hypocalcemia.  Will check PTH level does not  .  Hyperphosphatemia.  Phosphate binders.  6.Hypertension: Continues with elevated blood pressures.  We will add amlodipine 10 mg daily.     LOS: [place] @TODAY @7 :02 AM

## 2020-07-25 NOTE — Progress Notes (Signed)
   Patient Status: [facility] - In-pt  Assessment and Plan: Patient in need of venous access.   Non tunneled dialysis catheter placement  ______________________________________________________________________   History of Present Illness: Tina Gomez is a 64 y.o. female   N/V few days; CKD 4/5 Dehydration secondary N/V/D Rising creatinine Nephrology recommending initiating dialysis Scheduled now for temp cath per Dr Justin Mend  Allergies and medications reviewed.   Review of Systems: A 12 point ROS discussed and pertinent positives are indicated in the HPI above.  All other systems are negative.   Vital Signs: BP (!) 179/71 (BP Location: Left Arm)   Pulse 60   Temp 97.7 F (36.5 C)   Resp 18   Ht 5\' 2"  (1.575 m)   Wt 187 lb 6.3 oz (85 kg)   SpO2 95%   BMI 34.27 kg/m     Imaging reviewed.   Labs:  COAGS: No results for input(s): INR, APTT in the last 8760 hours.  BMP: Recent Labs    05/30/20 0345 05/30/20 0345 05/31/20 1424 05/31/20 1424 06/01/20 1038 06/01/20 1038 06/15/20 1134 07/23/20 0239 07/23/20 0239 07/23/20 1110 07/24/20 0343 07/24/20 0418 07/25/20 0341  NA 142   < > 140   < > 140   < > 143 141  --   --  142 144 140  K 3.2*   < > 3.5   < > 3.2*   < > 4.8 4.2  --   --  3.5 3.6 3.0*  CL 112*   < > 108   < > 111   < > 107* 109  --   --  106 107 92*  CO2 19*   < > 16*   < > 19*   < > 19* 11*  --   --  16* 14* 24  GLUCOSE 120*   < > 112*   < > 138*   < > 76 94  --   --  72 66* 71  BUN 21   < > 24*   < > 23   < > 56* 138*  --   --  122* 125* 113*  CALCIUM 7.6*   < > 7.7*   < > 7.6*   < > 7.7* 5.8*  --   --  5.9* 6.0* 5.6*  CREATININE 3.35*   < > 3.50*   < > 3.43*   < > 4.10* 12.49*   < > 11.80* 11.34* 11.32* 10.51*  GFRNONAA 14*   < > 13*   < > 13*   < > 11* 3*   < > 3* 3* 3* 4*  GFRAA 16*  --  15*  --  16*  --  13*  --   --   --   --   --   --    < > = values in this interval not displayed.   Worsening acute on chronic renal failure To initiate  dialysis per Dr Justin Mend Non tunneled catheter ordered Scheduled for today in IR She is aware of procedure benefit and risks Agreeable to proceed Consent signed and in chart   Electronically Signed: Lavonia Drafts, PA-C 07/25/2020, 8:50 AM   I spent a total of 15 minutes in face to face in clinical consultation, greater than 50% of which was counseling/coordinating care for venous access.

## 2020-07-26 ENCOUNTER — Inpatient Hospital Stay (HOSPITAL_COMMUNITY): Payer: Medicaid Other

## 2020-07-26 DIAGNOSIS — N186 End stage renal disease: Secondary | ICD-10-CM

## 2020-07-26 LAB — RENAL FUNCTION PANEL
Albumin: 1.8 g/dL — ABNORMAL LOW (ref 3.5–5.0)
Albumin: 2 g/dL — ABNORMAL LOW (ref 3.5–5.0)
Anion gap: 20 — ABNORMAL HIGH (ref 5–15)
Anion gap: 21 — ABNORMAL HIGH (ref 5–15)
BUN: 58 mg/dL — ABNORMAL HIGH (ref 8–23)
BUN: 62 mg/dL — ABNORMAL HIGH (ref 8–23)
CO2: 28 mmol/L (ref 22–32)
CO2: 28 mmol/L (ref 22–32)
Calcium: 6.3 mg/dL — CL (ref 8.9–10.3)
Calcium: 7.1 mg/dL — ABNORMAL LOW (ref 8.9–10.3)
Chloride: 93 mmol/L — ABNORMAL LOW (ref 98–111)
Chloride: 91 mmol/L — ABNORMAL LOW (ref 98–111)
Creatinine, Ser: 7.23 mg/dL — ABNORMAL HIGH (ref 0.44–1.00)
Creatinine, Ser: 7.1 mg/dL — ABNORMAL HIGH (ref 0.44–1.00)
GFR, Estimated: 6 mL/min — ABNORMAL LOW (ref >60)
GFR, Estimated: 6 mL/min — ABNORMAL LOW (ref >60)
Glucose, Bld: 96 mg/dL (ref 70–99)
Glucose, Bld: 138 mg/dL — ABNORMAL HIGH (ref 70–99)
Phosphorus: 8.3 mg/dL — ABNORMAL HIGH (ref 2.5–4.6)
Phosphorus: 8.1 mg/dL — ABNORMAL HIGH (ref 2.5–4.6)
Potassium: 2.5 mmol/L — CL (ref 3.5–5.1)
Potassium: 2.9 mmol/L — ABNORMAL LOW (ref 3.5–5.1)
Sodium: 141 mmol/L (ref 135–145)
Sodium: 140 mmol/L (ref 135–145)

## 2020-07-26 LAB — POTASSIUM: Potassium: 2.5 mmol/L — CL (ref 3.5–5.1)

## 2020-07-26 LAB — GLUCOSE, CAPILLARY
Glucose-Capillary: 91 mg/dL (ref 70–99)
Glucose-Capillary: 103 mg/dL — ABNORMAL HIGH (ref 70–99)

## 2020-07-26 LAB — PARATHYROID HORMONE, INTACT (NO CA): PTH: 96 pg/mL — ABNORMAL HIGH (ref 15–65)

## 2020-07-26 LAB — HEPATITIS B SURFACE ANTIGEN: Hepatitis B Surface Ag: NEGATIVE — AB

## 2020-07-26 LAB — HEPATITIS B SURFACE ANTIBODY,QUALITATIVE: Hep B S Ab: NONREACTIVE

## 2020-07-26 LAB — HEPATITIS B CORE ANTIBODY, TOTAL: Hep B Core Total Ab: NEGATIVE — AB

## 2020-07-26 LAB — MAGNESIUM: Magnesium: 1.6 mg/dL — ABNORMAL LOW (ref 1.7–2.4)

## 2020-07-26 MED ORDER — POTASSIUM CHLORIDE CRYS ER 20 MEQ PO TBCR: 40.0000 meq | EXTENDED_RELEASE_TABLET | Freq: Once | ORAL | Status: DC

## 2020-07-26 MED ORDER — POTASSIUM CHLORIDE 10 MEQ/100ML IV SOLN
10.0000 meq | INTRAVENOUS | Status: AC
  Administered 2020-07-26: 10 meq via INTRAVENOUS
  Filled 2020-07-26 (×2): qty 100

## 2020-07-26 MED ORDER — MAGNESIUM SULFATE 2 GM/50ML IV SOLN
2.0000 g | Freq: Once | INTRAVENOUS | Status: AC
  Administered 2020-07-26: 2 g via INTRAVENOUS
  Filled 2020-07-26: qty 50

## 2020-07-26 MED ORDER — POTASSIUM CHLORIDE 10 MEQ/100ML IV SOLN: 10.0000 meq | INTRAVENOUS | Status: DC

## 2020-07-26 MED ORDER — POTASSIUM CHLORIDE 10 MEQ/100ML IV SOLN
10.0000 meq | INTRAVENOUS | Status: AC
  Administered 2020-07-26 (×2): 10 meq via INTRAVENOUS
  Filled 2020-07-26 (×2): qty 100

## 2020-07-26 MED ORDER — POTASSIUM CHLORIDE 10 MEQ/100ML IV SOLN
10.0000 meq | Freq: Once | INTRAVENOUS | Status: AC
  Administered 2020-07-26: 10 meq via INTRAVENOUS

## 2020-07-26 MED ORDER — KIDNEY FAILURE BOOK: Freq: Once | Status: AC

## 2020-07-26 MED ORDER — POTASSIUM CHLORIDE 10 MEQ/100ML IV SOLN
10.0000 meq | INTRAVENOUS | Status: AC
  Administered 2020-07-26 – 2020-07-27 (×3): 10 meq via INTRAVENOUS
  Filled 2020-07-26 (×3): qty 100

## 2020-07-26 MED ORDER — POTASSIUM CHLORIDE 10 MEQ/100ML IV SOLN
10.0000 meq | INTRAVENOUS | Status: DC
  Filled 2020-07-26: qty 100

## 2020-07-26 MED ORDER — CALCIUM GLUCONATE-NACL 1-0.675 GM/50ML-% IV SOLN: 1.0000 g | Freq: Once | INTRAVENOUS | Status: DC

## 2020-07-26 MED ORDER — CHLORHEXIDINE GLUCONATE CLOTH 2 % EX PADS
6.0000 | MEDICATED_PAD | Freq: Every day | CUTANEOUS | Status: DC
  Administered 2020-07-26 – 2020-07-31 (×5): 6 via TOPICAL

## 2020-07-26 MED ORDER — POTASSIUM CHLORIDE 20 MEQ PO PACK
20.0000 meq | PACK | Freq: Two times a day (BID) | ORAL | Status: DC
  Administered 2020-07-26: 20 meq via ORAL
  Filled 2020-07-26: qty 1

## 2020-07-26 NOTE — Progress Notes (Signed)
PROGRESS NOTE    Tina Gomez  HYQ:[id number] DOB: 12/14/1955 DOA: 07/23/2020 PCP: Charlott Rakes, MD   Brief Narrative:  HPI on 07/23/2020 by Dr. Kyra Searles [name] is an 64 y.o. 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  History is per patient and her daughter who is at bedside.   Patient states she did relatively wel after discharge on 06/01/2020 for about a month and subsequently started having intermittent nausea couple of days a week.  Over the past week patient has had marked increase in her nausea and vomiting and has not had a FULL meal for about a week.  She also notes that even trying to drink water has been difficult due to vomiting.  She notes she has been thirsty for the last couple of days.  Over the past 3 days patient has had almost continuous nausea and vomiting unable to be controlled with her usual antinausea medications.  Patient denies any fevers or chills.  No abdominal pain.  No blood in the vomitus.  No melena or hematochezia.  No diarrhea.  Patient admits that she has very poor exercise tolerance and mostly is in bed but this is not anything new.  Denies any new shortness of breath or chest pain.  Patient specifically denies any orthopnea or PND.  She also specifically denies any history of CHF or pulmonary edema.  Denies CAD or MI in past.  Interim history Admitted with acute kidney injury, nephrology consulted and following. IR consulted for HD catheter placement as nephrology wants to start HD.  Subjective:   Tina Gomez seen and examined today. She still reports nausea, vomiting, and very poor appetite, she denies any shortness of breath or chest pain, but she does report fatigue as well.   Assessment & Plan   Acute kidney injury on chronic kidney  disease, stage IV-V -Likely secondary to severe dehydration from intractable nausea and vomiting as well as poor oral intake, with underlining of CKD due to diabetic nephropathy. -Management per renal, plan to initiate hemodialysis, first session tomorrow. -Interventional radiology consulted, status post right IJ temp HD cath -Renal ultrasound showed no hydronephrosis -seen by vascular for permanent access.  Anion gap metabolic acidosis -Likely secondary to renal failure -Current level of 25 today, will discontinue her bicarb drip.  Intractable nausea and vomiting due to gastroparesis -Given prolonged QTC, Discontinued Zofran and Reglan, continue Phenergan  -Continue to monitor on telemetry, will recheck magnesium level and replete potassium.  Anemia of chronic disease -Epogen per renal  Hypokalemia -Significantly low today at 2.5, repleted, recheck this afternoon  Hypocalcemia -Continue to replace and monitor, renal are following.  Hyperphosphatemia -Management per renal, started on binders  Diabetes mellitus, type II -Appears to be diet controlled -Continue to monitor CBGs, if elevated, will start on insulin sliding scale  Essential hypertension -Continue Coreg  GERD -Continue PPI  Anxiety/depression -Continue duloxetine  History of CVA -Continue aspirin, statin  COPD -Currently stable and compensated -Continue bronchodilators  Tobacco abuse -Cessation discussed, continue nicotine patch  ?  Superficial thrombophlebitis of the lower extremity -Lower extremity Doppler negative for DVT  DVT Prophylaxis  heparin  Code Status: Full  Family Communication: None at bedside  Disposition Plan:  Status is: Inpatient  Remains inpatient appropriate because:Persistent severe electrolyte disturbances, IV treatments appropriate due to  intensity of illness or inability to take PO and Inpatient level of care appropriate due to severity of illness   Dispo: The patient  is from: Home              Anticipated d/c is to: Home              Anticipated d/c date is: > 3 days              Patient currently is not medically stable to d/c.  Consultants Nephrology Interventional radiology Vascular surgery  Procedures  Renal US LE doppler Right IJ temp HD cath placed  Antibiotics   Anti-infectives (From admission, onward)   None        Objective:   Vitals:   07/26/20 0026 07/26/20 0328 07/26/20 0800 07/26/20 1423  BP: (!) 153/53 (!) 171/58  (!) 172/74  Pulse: 66 69  64  Resp: 18 18  20   Temp: 97.7 F (36.5 C) 97.8 F (36.6 C)  97.7 F (36.5 C)  TempSrc: Oral Oral    SpO2: 94% 93% 92% 96%  Weight:      Height:        Intake/Output Summary (Last 24 hours) at 07/26/2020 1456 Last data filed at 07/26/2020 1353 Gross per 24 hour  Intake 1240 ml  Output 1200 ml  Net 40 ml   Filed Weights   07/23/20 0213 07/25/20 1220 07/25/20 1438  Weight: 85 kg 80.7 kg 79.6 kg   Exam  Awake Alert, Oriented X 3, frail, chronically ill-appearing no new F.N deficits, Normal affect Symmetrical Chest wall movement, Good air movement bilaterally, CTAB RRR,No Gallops,Rubs or new Murmurs, No Parasternal Heave +ve B.Sounds, Abd Soft, No tenderness, No rebound - guarding or rigidity. No Cyanosis, Clubbing or edema, No new Rash or bruise    Data Reviewed: I have personally reviewed following labs and imaging studies  CBC: Recent Labs  Lab 07/23/20 0239 07/23/20 1110 07/24/20 0343 07/25/20 0341 07/25/20 1637  WBC 10.8* 10.3 6.8 7.2 7.1  HGB 9.1* 8.3* 8.2* 8.3* 9.2*  HCT 29.7* 27.1* 26.3* 25.2* 27.5*  MCV 90.0 90.0 88.3 84.6 85.7  PLT 359 320 322 303 016   Basic Metabolic Panel: Recent Labs  Lab 07/23/20 0239 07/23/20 0239 07/23/20 1110 07/24/20 0343 07/24/20 0418 07/25/20 0341 07/26/20 0235 07/26/20 0608  NA 141  --   --  142 144 140 141  --   K 4.2   < >  --  3.5 3.6 3.0* 2.5* 2.5*  CL 109  --   --  106 107 92* 93*  --   CO2 11*  --    --  16* 14* 24 28  --   GLUCOSE 94  --   --  72 66* 71 96  --   BUN 138*  --   --  122* 125* 113* 58*  --   CREATININE 12.49*   < > 11.80* 11.34* 11.32* 10.51* 7.23*  --   CALCIUM 5.8*  --   --  5.9*  5.8* 6.0* 5.6* 6.3*  --   MG  --   --   --   --   --  1.8  --   --   PHOS  --   --   --  >30.0* >30.0* >30.0* 8.3*  --    < > = values in this interval not displayed.   GFR: Estimated Creatinine Clearance: 7.8 mL/min (A) (by C-G formula based on SCr of 7.23 mg/dL (H)).  Liver Function Tests: Recent Labs  Lab 07/23/20 0239 07/24/20 0418 07/25/20 0341 07/26/20 0235  AST 8*  --   --   --   ALT 6  --   --   --   ALKPHOS 82  --   --   --   BILITOT 0.3  --   --   --   PROT 6.1*  --   --   --   ALBUMIN 2.0* 1.7* 1.8* 1.8*   Recent Labs  Lab 07/23/20 0239  LIPASE 52*   No results for input(s): AMMONIA in the last 168 hours. Coagulation Profile: No results for input(s): INR, PROTIME in the last 168 hours. Cardiac Enzymes: Recent Labs  Lab 07/24/20 0343  CKTOTAL 47   BNP (last 3 results) No results for input(s): PROBNP in the last 8760 hours. HbA1C: No results for input(s): HGBA1C in the last 72 hours. CBG: Recent Labs  Lab 07/26/20 0424 07/26/20 1239  GLUCAP 91 103*   Lipid Profile: No results for input(s): CHOL, HDL, LDLCALC, TRIG, CHOLHDL, LDLDIRECT in the last 72 hours. Thyroid Function Tests: No results for input(s): TSH, T4TOTAL, FREET4, T3FREE, THYROIDAB in the last 72 hours. Anemia Panel: Recent Labs    07/24/20 0343  FERRITIN 201  TIBC 140*  IRON 58   Urine analysis:    Component Value Date/Time   COLORURINE YELLOW 07/23/2020 0146   APPEARANCEUR CLOUDY (A) 07/23/2020 0146   LABSPEC 1.010 07/23/2020 0146   PHURINE 5.0 07/23/2020 0146   GLUCOSEU 50 (A) 07/23/2020 0146   HGBUR MODERATE (A) 07/23/2020 0146   BILIRUBINUR NEGATIVE 07/23/2020 0146   KETONESUR NEGATIVE 07/23/2020 0146   PROTEINUR >=300 (A) 07/23/2020 0146   UROBILINOGEN 1.0 10/22/2013  1626   NITRITE NEGATIVE 07/23/2020 0146   LEUKOCYTESUR NEGATIVE 07/23/2020 0146   Sepsis Labs: @LABRCNTIP (procalcitonin:4,lacticidven:4)  ) Recent Results (from the past 240 hour(s))  Respiratory Panel by RT PCR (Flu A&B, Covid) - Nasopharyngeal Swab     Status: None   Collection Time: 07/23/20  6:20 AM   Specimen: Nasopharyngeal Swab  Result Value Ref Range Status   SARS Coronavirus 2 by RT PCR NEGATIVE NEGATIVE Final    Comment: (NOTE) SARS-CoV-2 target nucleic acids are NOT DETECTED.  The SARS-CoV-2 RNA is generally detectable in upper respiratoy specimens during the acute phase of infection. The lowest concentration of SARS-CoV-2 viral copies this assay can detect is 131 copies/mL. A negative result does not preclude SARS-Cov-2 infection and should not be used as the sole basis for treatment or other patient management decisions. A negative result may occur with  improper specimen collection/handling, submission of specimen other than nasopharyngeal swab, presence of viral mutation(s) within the areas targeted by this assay, and inadequate number of viral copies (<131 copies/mL). A negative result must be combined with clinical observations, patient history, and epidemiological information. The expected result is Negative.  Fact Sheet for Patients:  PinkCheek.be  Fact Sheet for Healthcare Providers:  GravelBags.it  This test is no t yet approved or cleared by the [place] FDA and  has been authorized for detection and/or diagnosis of SARS-CoV-2 by FDA under an Emergency Use Authorization (EUA). This EUA will remain  in effect (meaning this test can be used) for the duration of the COVID-19 declaration under Section 564(b)(1) of the Act, 21 U.S.C. section 360bbb-3(b)(1), unless the authorization is terminated or revoked sooner.     Influenza A by PCR NEGATIVE NEGATIVE Final   Influenza B by PCR NEGATIVE  NEGATIVE  Final    Comment: (NOTE) The Xpert Xpress SARS-CoV-2/FLU/RSV assay is intended as an aid in  the diagnosis of influenza from Nasopharyngeal swab specimens and  should not be used as a sole basis for treatment. Nasal washings and  aspirates are unacceptable for Xpert Xpress SARS-CoV-2/FLU/RSV  testing.  Fact Sheet for Patients: PinkCheek.be  Fact Sheet for Healthcare Providers: GravelBags.it  This test is not yet approved or cleared by the [place] FDA and  has been authorized for detection and/or diagnosis of SARS-CoV-2 by  FDA under an Emergency Use Authorization (EUA). This EUA will remain  in effect (meaning this test can be used) for the duration of the  Covid-19 declaration under Section 564(b)(1) of the Act, 21  U.S.C. section 360bbb-3(b)(1), unless the authorization is  terminated or revoked. Performed at [facility], [place] [address], [place], [place] [address]       Radiology Studies: IR Fluoro Guide CV Line Right  Result Date: 07/25/2020 INDICATION: Acute kidney injury, access for dialysis EXAM: ULTRASOUND AND FLUOROSCOPIC RIGHT IJ TEMPORARY DIALYSIS CATHETER [facility] CATHETER) MEDICATIONS: 1% LIDOCAINE LOCAL ANESTHESIA/SEDATION: None. FLUOROSCOPY TIME:  Fluoroscopy Time: 0 minutes 12 seconds (1 mGy). COMPLICATIONS: None immediate. PROCEDURE: Informed written consent was obtained from the patient after a thorough discussion of the procedural risks, benefits and alternatives. All questions were addressed. Maximal Sterile Barrier Technique was utilized including caps, mask, sterile gowns, sterile gloves, sterile drape, hand hygiene and skin antiseptic. A timeout was performed prior to the initiation of the procedure. Under sterile conditions and local anesthesia, ultrasound micropuncture access performed of right internal jugular vein. Images obtained for documentation of the patent right internal  jugular vein. 018 guidewire inserted followed by 4 French micro dilator. Amplatz guidewire inserted. Measurements obtained. Tract dilatation performed to insert a 16 cm temporary Mahurkar dialysis catheter. Tip position SVC RA junction. Catheter secured with 2 0 Ethilon sutures and a sterile dressing. Blood aspirated easily followed by saline and heparin flushes. External caps applied. No immediate complication. Patient tolerated the procedure well IMPRESSION: Successful ultrasound fluoroscopic right IJ temporary dialysis catheter (16 cm Mahurkar catheter). Tip SVC RA junction. Ready for use. Electronically Signed   By: Jerilynn Mages.  Shick M.D.   On: 07/25/2020 12:09   IR US Guide Vasc Access Right  Result Date: 07/25/2020 INDICATION: Acute kidney injury, access for dialysis EXAM: ULTRASOUND AND FLUOROSCOPIC RIGHT IJ TEMPORARY DIALYSIS CATHETER [facility] CATHETER) MEDICATIONS: 1% LIDOCAINE LOCAL ANESTHESIA/SEDATION: None. FLUOROSCOPY TIME:  Fluoroscopy Time: 0 minutes 12 seconds (1 mGy). COMPLICATIONS: None immediate. PROCEDURE: Informed written consent was obtained from the patient after a thorough discussion of the procedural risks, benefits and alternatives. All questions were addressed. Maximal Sterile Barrier Technique was utilized including caps, mask, sterile gowns, sterile gloves, sterile drape, hand hygiene and skin antiseptic. A timeout was performed prior to the initiation of the procedure. Under sterile conditions and local anesthesia, ultrasound micropuncture access performed of right internal jugular vein. Images obtained for documentation of the patent right internal jugular vein. 018 guidewire inserted followed by 4 French micro dilator. Amplatz guidewire inserted. Measurements obtained. Tract dilatation performed to insert a 16 cm temporary Mahurkar dialysis catheter. Tip position SVC RA junction. Catheter secured with 2 0 Ethilon sutures and a sterile dressing. Blood aspirated easily followed by saline  and heparin flushes. External caps applied. No immediate complication. Patient tolerated the procedure well IMPRESSION: Successful ultrasound fluoroscopic right IJ temporary dialysis catheter (16 cm Mahurkar catheter). Tip SVC RA junction. Ready for use. Electronically  Signed   By: Jerilynn Mages.  Shick M.D.   On: 07/25/2020 12:09     Scheduled Meds: . amLODipine  10 mg Oral Daily  . aspirin  325 mg Oral Daily  . atorvastatin  40 mg Oral QPM  . calcium acetate  2,001 mg Oral TID WC  . calcium carbonate  1 tablet Oral BID WC  . carvedilol  12.5 mg Oral BID WC  . Chlorhexidine Gluconate Cloth  6 each Topical Q0600  . Chlorhexidine Gluconate Cloth  6 each Topical Q0600  . darbepoetin (ARANESP) injection - NON-DIALYSIS  150 mcg Subcutaneous Q Mon-1800  . DULoxetine  60 mg Oral Daily  . heparin  5,000 Units Subcutaneous Q8H  . nicotine  21 mg Transdermal Daily  . pantoprazole (PROTONIX) IV  40 mg Intravenous Q12H   Continuous Infusions: . sodium chloride    . sodium chloride    . potassium chloride 10 mEq (07/26/20 1347)  .  sodium bicarbonate (isotonic) infusion in sterile water 150 mL/hr at 07/26/20 0419     LOS: 3 days    Phillips Climes MD. on 07/26/2020 at 2:56 PM  Between 7am to 7pm - Please see pager noted on [email or web address]  After 7pm go to [email or web address]  And look for the night coverage person covering for me after hours  [facility] Office  [phone]

## 2020-07-26 NOTE — Progress Notes (Signed)
[facility] ROUNDING NOTE   Subjective:   Brief history 64 year old lady with history of diabetes hypertension gastroparesis stroke CKD stage III with nausea vomiting decreased oral intake.  She has a history of gastroparesis followed by GI.  Longstanding history of CKD with recent progression baseline serum creatinine by 3.44 mg/dL does not have a nephrologist but recently referred to [facility].  On arrival in the emergency room blood pressure was elevated BUN 138 creatinine elevated to 12.49.  She is not on ACE inhibitor or ARB at denies NSAID use.  She appeared clinically dehydrated and was placed on IV sodium bicarbonate 150 cc an hour.  Renal ultrasound did not show any evidence of hydronephrosis but diffuse increased cortical echogenicity.   Subjective She underwent dialysis 07/25/2020 with 1 L ultrafiltered.  This was principally due to metabolic disarray.  Oliguric renal failure   Blood pressure 171/58 pulse 68 temperature 97.8 O2 sats 93% room air  Sodium 141 potassium 2.5 chloride 93 CO2 28 BUN 58 creatinine 7.23 glucose 96 calcium 6.3 hemoglobin 9.2 iron saturations 41%  No recorded urine output on chart  Objective:  Vital signs in last 24 hours:  Temp:  [97.7 F (36.5 C)-97.8 F (36.6 C)] 97.8 F (36.6 C) (11/17 0328) Pulse Rate:  [55-69] 69 (11/17 0328) Resp:  [11-19] 18 (11/17 0328) BP: (153-222)/(51-107) 171/58 (11/17 0328) SpO2:  [93 %-99 %] 93 % (11/17 0328) Weight:  [79.6 kg-80.7 kg] 79.6 kg (11/16 1438)  Weight change:  Filed Weights   07/23/20 0213 07/25/20 1220 07/25/20 1438  Weight: 85 kg 80.7 kg 79.6 kg    Intake/Output: I/O last 3 completed shifts: In: 1480 [P.O.:480; I.V.:1000] Out: 947 [Other:947]   Intake/Output this shift:  No intake/output data recorded.  General exam: Ill-looking female lying on bed, dry mucous membrane. Respiratory system: Clear to auscultation. Respiratory effort normal. No wheezing or  crackle Cardiovascular system: S1 & S2 heard, RRR.  No pedal edema. Gastrointestinal system: Abdomen is nondistended, soft and nontender. Normal bowel sounds heard. Central nervous system: Alert and oriented. No focal neurological deficits. Extremities: Symmetric 5 x 5 power. Skin: No rashes, lesions or ulcers Psychiatry: Judgement and insight appear normal. Mood & affect appropriate   Basic Metabolic Panel: Recent Labs  Lab 07/23/20 0239 07/23/20 0239 07/23/20 1110 07/24/20 0343 07/24/20 0343 07/24/20 0418 07/25/20 0341 07/26/20 0235  NA 141  --   --  142  --  144 140 141  K 4.2  --   --  3.5  --  3.6 3.0* 2.5*  CL 109  --   --  106  --  107 92* 93*  CO2 11*  --   --  16*  --  14* 24 28  GLUCOSE 94  --   --  72  --  66* 71 96  BUN 138*  --   --  122*  --  125* 113* 58*  CREATININE 12.49*   < > 11.80* 11.34*  --  11.32* 10.51* 7.23*  CALCIUM 5.8*   < >  --  5.9*  5.8*   < > 6.0* 5.6* 6.3*  MG  --   --   --   --   --   --  1.8  --   PHOS  --   --   --  >30.0*  --  >30.0* >30.0* 8.3*   < > = values in this interval not displayed.    Liver Function Tests: Recent Labs  Lab  07/23/20 0239 07/24/20 0418 07/25/20 0341 07/26/20 0235  AST 8*  --   --   --   ALT 6  --   --   --   ALKPHOS 82  --   --   --   BILITOT 0.3  --   --   --   PROT 6.1*  --   --   --   ALBUMIN 2.0* 1.7* 1.8* 1.8*   Recent Labs  Lab 07/23/20 0239  LIPASE 52*   No results for input(s): AMMONIA in the last 168 hours.  CBC: Recent Labs  Lab 07/23/20 0239 07/23/20 1110 07/24/20 0343 07/25/20 0341 07/25/20 1637  WBC 10.8* 10.3 6.8 7.2 7.1  HGB 9.1* 8.3* 8.2* 8.3* 9.2*  HCT 29.7* 27.1* 26.3* 25.2* 27.5*  MCV 90.0 90.0 88.3 84.6 85.7  PLT 359 320 322 303 341    Cardiac Enzymes: Recent Labs  Lab 07/24/20 0343  CKTOTAL 47    BNP: Invalid input(s): POCBNP  CBG: Recent Labs  Lab 07/26/20 0424  GLUCAP 2    Microbiology: Results for orders placed or performed during the hospital  encounter of 07/23/20  Respiratory Panel by RT PCR (Flu A&B, Covid) - Nasopharyngeal Swab     Status: None   Collection Time: 07/23/20  6:20 AM   Specimen: Nasopharyngeal Swab  Result Value Ref Range Status   SARS Coronavirus 2 by RT PCR NEGATIVE NEGATIVE Final    Comment: (NOTE) SARS-CoV-2 target nucleic acids are NOT DETECTED.  The SARS-CoV-2 RNA is generally detectable in upper respiratoy specimens during the acute phase of infection. The lowest concentration of SARS-CoV-2 viral copies this assay can detect is 131 copies/mL. A negative result does not preclude SARS-Cov-2 infection and should not be used as the sole basis for treatment or other patient management decisions. A negative result may occur with  improper specimen collection/handling, submission of specimen other than nasopharyngeal swab, presence of viral mutation(s) within the areas targeted by this assay, and inadequate number of viral copies (<131 copies/mL). A negative result must be combined with clinical observations, patient history, and epidemiological information. The expected result is Negative.  Fact Sheet for Patients:  PinkCheek.be  Fact Sheet for Healthcare Providers:  GravelBags.it  This test is no t yet approved or cleared by the [place] FDA and  has been authorized for detection and/or diagnosis of SARS-CoV-2 by FDA under an Emergency Use Authorization (EUA). This EUA will remain  in effect (meaning this test can be used) for the duration of the COVID-19 declaration under Section 564(b)(1) of the Act, 21 U.S.C. section 360bbb-3(b)(1), unless the authorization is terminated or revoked sooner.     Influenza A by PCR NEGATIVE NEGATIVE Final   Influenza B by PCR NEGATIVE NEGATIVE Final    Comment: (NOTE) The Xpert Xpress SARS-CoV-2/FLU/RSV assay is intended as an aid in  the diagnosis of influenza from Nasopharyngeal swab specimens and   should not be used as a sole basis for treatment. Nasal washings and  aspirates are unacceptable for Xpert Xpress SARS-CoV-2/FLU/RSV  testing.  Fact Sheet for Patients: PinkCheek.be  Fact Sheet for Healthcare Providers: GravelBags.it  This test is not yet approved or cleared by the [place] FDA and  has been authorized for detection and/or diagnosis of SARS-CoV-2 by  FDA under an Emergency Use Authorization (EUA). This EUA will remain  in effect (meaning this test can be used) for the duration of the  Covid-19 declaration under Section 564(b)(1) of the Act, 21  U.S.C. section 360bbb-3(b)(1), unless the authorization is  terminated or revoked. Performed at [facility], [place] [address] [place], [place] [address]     Coagulation Studies: No results for input(s): LABPROT, INR in the last 72 hours.  Urinalysis: No results for input(s): COLORURINE, LABSPEC, PHURINE, GLUCOSEU, HGBUR, BILIRUBINUR, KETONESUR, PROTEINUR, UROBILINOGEN, NITRITE, LEUKOCYTESUR in the last 72 hours.  Invalid input(s): APPERANCEUR    Imaging: IR Fluoro Guide CV Line Right  Result Date: 07/25/2020 INDICATION: Acute kidney injury, access for dialysis EXAM: ULTRASOUND AND FLUOROSCOPIC RIGHT IJ TEMPORARY DIALYSIS CATHETER [facility] CATHETER) MEDICATIONS: 1% LIDOCAINE LOCAL ANESTHESIA/SEDATION: None. FLUOROSCOPY TIME:  Fluoroscopy Time: 0 minutes 12 seconds (1 mGy). COMPLICATIONS: None immediate. PROCEDURE: Informed written consent was obtained from the patient after a thorough discussion of the procedural risks, benefits and alternatives. All questions were addressed. Maximal Sterile Barrier Technique was utilized including caps, mask, sterile gowns, sterile gloves, sterile drape, hand hygiene and skin antiseptic. A timeout was performed prior to the initiation of the procedure. Under sterile conditions and local anesthesia, ultrasound  micropuncture access performed of right internal jugular vein. Images obtained for documentation of the patent right internal jugular vein. 018 guidewire inserted followed by 4 French micro dilator. Amplatz guidewire inserted. Measurements obtained. Tract dilatation performed to insert a 16 cm temporary Mahurkar dialysis catheter. Tip position SVC RA junction. Catheter secured with 2 0 Ethilon sutures and a sterile dressing. Blood aspirated easily followed by saline and heparin flushes. External caps applied. No immediate complication. Patient tolerated the procedure well IMPRESSION: Successful ultrasound fluoroscopic right IJ temporary dialysis catheter (16 cm Mahurkar catheter). Tip SVC RA junction. Ready for use. Electronically Signed   By: Jerilynn Mages.  Shick M.D.   On: 07/25/2020 12:09   IR US Guide Vasc Access Right  Result Date: 07/25/2020 INDICATION: Acute kidney injury, access for dialysis EXAM: ULTRASOUND AND FLUOROSCOPIC RIGHT IJ TEMPORARY DIALYSIS CATHETER [facility] CATHETER) MEDICATIONS: 1% LIDOCAINE LOCAL ANESTHESIA/SEDATION: None. FLUOROSCOPY TIME:  Fluoroscopy Time: 0 minutes 12 seconds (1 mGy). COMPLICATIONS: None immediate. PROCEDURE: Informed written consent was obtained from the patient after a thorough discussion of the procedural risks, benefits and alternatives. All questions were addressed. Maximal Sterile Barrier Technique was utilized including caps, mask, sterile gowns, sterile gloves, sterile drape, hand hygiene and skin antiseptic. A timeout was performed prior to the initiation of the procedure. Under sterile conditions and local anesthesia, ultrasound micropuncture access performed of right internal jugular vein. Images obtained for documentation of the patent right internal jugular vein. 018 guidewire inserted followed by 4 French micro dilator. Amplatz guidewire inserted. Measurements obtained. Tract dilatation performed to insert a 16 cm temporary Mahurkar dialysis catheter. Tip position  SVC RA junction. Catheter secured with 2 0 Ethilon sutures and a sterile dressing. Blood aspirated easily followed by saline and heparin flushes. External caps applied. No immediate complication. Patient tolerated the procedure well IMPRESSION: Successful ultrasound fluoroscopic right IJ temporary dialysis catheter (16 cm Mahurkar catheter). Tip SVC RA junction. Ready for use. Electronically Signed   By: Jerilynn Mages.  Shick M.D.   On: 07/25/2020 12:09     Medications:   . sodium chloride    . sodium chloride    . potassium chloride 10 mEq (07/26/20 0529)  .  sodium bicarbonate (isotonic) infusion in sterile water 150 mL/hr at 07/26/20 0419   . amLODipine  10 mg Oral Daily  . aspirin  325 mg Oral Daily  . atorvastatin  40 mg Oral QPM  . calcium acetate  2,001 mg Oral TID  WC  . calcium carbonate  1 tablet Oral BID WC  . carvedilol  12.5 mg Oral BID WC  . Chlorhexidine Gluconate Cloth  6 each Topical Q0600  . darbepoetin (ARANESP) injection - NON-DIALYSIS  150 mcg Subcutaneous Q Mon-1800  . DULoxetine  60 mg Oral Daily  . heparin  5,000 Units Subcutaneous Q8H  . nicotine  21 mg Transdermal Daily  . pantoprazole (PROTONIX) IV  40 mg Intravenous Q12H   sodium chloride, sodium chloride, acetaminophen **OR** acetaminophen, alteplase, heparin, hydrALAZINE, lidocaine (PF), lidocaine (PF), lidocaine-prilocaine, metoCLOPramide (REGLAN) injection, pentafluoroprop-tetrafluoroeth, promethazine  Assessment/ Plan:  1. Acute kidney injury on CKD IV/V due to severe dehydration caused by intractable nausea, vomiting and decreased oral intake.  She is at high risk of worsening renal failure.  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  Looks dry on physical exam.   Renal ultrasound showed no evidence of hydronephrosis.     Will make plans for tunneled dialysis catheter and consult vein vascular surgery for placement of AV fistula.  We will continue dialysis will also inform clip team and renal navigator.  Next dialysis  session will be 07/27/2020 tolerated first dialysis treatment 07/25/2020 with 1 L ultrafiltered.  2.Intractable nausea vomiting due to gastroparesis: She has a history of diabetes.  Followed by GI as outpatient.  Continue supportive care, antiemetics per primary team.  3 Anion gap metabolic acidosis due to renal failure.  Not much return of renal function will have catheter placed and initiate dialysis.  4.Anemia of CKD, chronic illness: Iron sats adequate at 41% continues on darbepoetin 150 mcg weekly  5.CKD MBD,  hypocalcemia.  PTH 81.  Hyperphosphatemia.  Phosphate binders.  6.Hypertension: Continues with elevated blood pressures.  We will add amlodipine 10 mg daily.  Should improve with ultrafiltration.  7.  Hypokalemia noted.  Replete cautiously    LOS: 3 Sherril Croon @TODAY @7 :04 AM

## 2020-07-26 NOTE — Progress Notes (Signed)
Critical lab value, Potassium 2.5 and calcium 6.3. Physician notified and will order K runs, patient is still experiencing nausea and occasional spells of vomiting. Agreed to hold off on oral K replacement for now due to N/V.

## 2020-07-26 NOTE — Progress Notes (Signed)
Rounded on patient today in correlation to transition to outpatient HD. Patient found to be resting comfortably in bed but slightly nauseous. Ordered consult to dietician and Kidney Failure book. Patient educated at the bedside regarding care of tunneled dialysis catheter, AV fistula/graft site care, assessment of thrill daily and proper medication administration on HD days.  Patient also educated on the importance of adhering to scheduled dialysis treatments, the effects of fluid overload, hyperkalemia and hyperphosphatemia. Patient capable of re-verbalizing via teach back method. Also educated patient on services available through the interdisciplinary team in the clinic setting and the differences they will note when transitioning to the outpatient setting from the hospital.   Patient inquired about how to begin paperwork for disability and medicaid, as she is concerned about the mounting costs. Explained to patient that this is paperwork that she could go ahead and start online.  She also verbalized that she has a 63 yr old autistic son that she cares for at home and will have to work out appropriate scheduling. I will speak with and defer to our Renal Navigator for further guidance. Patient also inquired about her ability to still travel. Educated patient that this is still possible, but that she will need to plan appropriately to have her dialysis center coordinate with a center wherever she is traveling. Patient with no further questions at this time. Handouts and contact information provided to patient for any further assistance. Will follow as appropriate.   Dorthey Sawyer, RN  Dialysis Nurse Coordinator Phone: [phone]

## 2020-07-26 NOTE — Consult Note (Signed)
Hospital Consult    Reason for Consult:  Permanent dialysis access Requesting Physician: Dr. Martin Webb MRN #:  [id number]  History of Present Illness: This is a 64 y.o. female with multiple medical conditions including Diabetes mellitus, hypertension, stroke, and chronic kidney disease secondary to diabetes. She presented to ER with several month history of nausea and vomiting. She does follow GI for gastroparesis. She is now felt to be at high risk of worsening renal failure and likely now end stage. She has not previously had hemodialysis prior to this admission. She did have Temp HD catheter placed and dialyzed yesterday for the first time. She denies any prior access surgery or central lines.  She is right handed. She has no history of upper extremity surgeries or disability. We have been asked to see her for permanent dialysis access placement  Past Medical History:  Diagnosis Date  . Hypertension   . Stroke (HCC)     Past Surgical History:  Procedure Laterality Date  . CESAREAN SECTION    . ESOPHAGOGASTRODUODENOSCOPY (EGD) WITH PROPOFOL N/A 05/26/2020   Procedure: ESOPHAGOGASTRODUODENOSCOPY (EGD) WITH PROPOFOL;  Surgeon: Perry, John N, MD;  Location: [facility] ENDOSCOPY;  Service: Endoscopy;  Laterality: N/A;  . IR FLUORO GUIDE CV LINE RIGHT  07/25/2020  . IR US GUIDE VASC ACCESS RIGHT  07/25/2020    Allergies  Allergen Reactions  . Codeine Nausea And Vomiting  . Adhesive [Tape] Other (See Comments)    Tape breaks out the skin if it is left on for a lengthy period of time    Prior to Admission medications   Medication Sig Start Date End Date Taking? Authorizing Provider  aspirin 325 MG tablet Take 1 tablet (325 mg total) by mouth daily. 11/25/18  Yes McClung, Angela M, PA-C  atorvastatin (LIPITOR) 40 MG tablet TAKE 1 TABLET BY MOUTH DAILY AT 6 PM. Patient taking differently: Take 40 mg by mouth every evening.  05/23/20  Yes Newlin, Enobong, MD  carvedilol (COREG) 12.5 MG tablet  Take 1 tablet (12.5 mg total) by mouth 2 (two) times daily with a meal. 06/15/20  Yes Newlin, Enobong, MD  clarithromycin (BIAXIN) 500 MG tablet Take 1 tablet (500 mg total) by mouth 2 (two) times daily. Hold atorvastatin while on clarithromycin 06/15/20  Yes Newlin, Enobong, MD  DULoxetine (CYMBALTA) 60 MG capsule Take 1 capsule (60 mg total) by mouth daily. 05/23/20  Yes Newlin, Enobong, MD  metoCLOPramide (REGLAN) 5 MG tablet Take 1 tablet (5 mg total) by mouth 4 (four) times daily -  before meals and at bedtime. Patient taking differently: Take 5 mg by mouth every 6 (six) hours as needed for nausea or vomiting.  06/28/20  Yes Guenther, Paula M, NP  omeprazole (PRILOSEC) 20 MG capsule Take 1 capsule (20 mg total) by mouth 2 (two) times daily before a meal. 06/15/20  Yes Newlin, Enobong, MD  ondansetron (ZOFRAN ODT) 4 MG disintegrating tablet Take 1 tablet (4 mg total) by mouth every 8 (eight) hours as needed for nausea or vomiting. 07/18/20  Yes Guenther, Paula M, NP  promethazine (PHENERGAN) 25 MG suppository Place 1 suppository (25 mg total) rectally every 8 (eight) hours as needed for nausea or vomiting. 05/23/20  Yes Newlin, Enobong, MD  amoxicillin (AMOXIL) 500 MG capsule Take 2 capsules (1,000 mg total) by mouth 2 (two) times daily. Patient not taking: Reported on 07/23/2020 06/15/20   Newlin, Enobong, MD  Blood Glucose Monitoring Suppl (BLOOD GLUCOSE METER) kit Use as instructed 11/01/13     Reyne Dumas, MD  Blood Glucose Monitoring Suppl (TRUE METRIX METER) DEVI 1 each by Does not apply route 3 (three) times daily before meals. 06/02/20   Charlott Rakes, MD  cetirizine (ZYRTEC) 10 MG tablet Take 1 tablet (10 mg total) by mouth daily. Patient not taking: Reported on 07/23/2020 11/13/16   Charlott Rakes, MD  glucose blood (TRUETEST TEST) test strip Use as instructed 06/02/20   Charlott Rakes, MD  Lancets (FREESTYLE) lancets Use as instructed 11/01/13   Reyne Dumas, MD  Na Sulfate-K Sulfate-Mg Sulf  (SUPREP BOWEL PREP KIT) 17.5-3.13-1.6 GM/177ML SOLN Take 1 kit by mouth as directed. For colonoscopy prep 06/28/20   Willia Craze, NP  Olopatadine HCl (PATADAY) 0.2 % SOLN Apply 1 drop to eye daily. Patient not taking: Reported on 07/23/2020 11/13/16   Charlott Rakes, MD  TRUEplus Lancets 28G MISC 1 each by Does not apply route 3 (three) times daily before meals. 06/02/20   Charlott Rakes, MD    Social History   Socioeconomic History  . Marital status: Divorced    Spouse name: Not on file  . Number of children: Not on file  . Years of education: Not on file  . Highest education level: Not on file  Occupational History  . Not on file  Tobacco Use  . Smoking status: Current Every Day Smoker    Packs/day: 0.25    Years: 10.00    Pack years: 2.50    Types: Cigarettes  . Smokeless tobacco: Never Used  . Tobacco comment: 5 cigs daily  Substance and Sexual Activity  . Alcohol use: No  . Drug use: No  . Sexual activity: Not on file  Other Topics Concern  . Not on file  Social History Narrative  . Not on file   Social Determinants of Health   Financial Resource Strain:   . Difficulty of Paying Living Expenses: Not on file  Food Insecurity:   . Worried About Charity fundraiser in the Last Year: Not on file  . Ran Out of Food in the Last Year: Not on file  Transportation Needs:   . Lack of Transportation (Medical): Not on file  . Lack of Transportation (Non-Medical): Not on file  Physical Activity:   . Days of Exercise per Week: Not on file  . Minutes of Exercise per Session: Not on file  Stress:   . Feeling of Stress : Not on file  Social Connections:   . Frequency of Communication with Friends and Family: Not on file  . Frequency of Social Gatherings with Friends and Family: Not on file  . Attends Religious Services: Not on file  . Active Member of Clubs or Organizations: Not on file  . Attends Archivist Meetings: Not on file  . Marital Status: Not on file   Intimate Partner Violence:   . Fear of Current or Ex-Partner: Not on file  . Emotionally Abused: Not on file  . Physically Abused: Not on file  . Sexually Abused: Not on file     Family History  Problem Relation Age of Onset  . Stroke Mother   . Hypertension Mother   . Hyperlipidemia Mother   . Hypertension Father   . Hyperlipidemia Father     ROS: Otherwise negative unless mentioned in HPI  Physical Examination  Vitals:   07/26/20 0328 07/26/20 0800  BP: (!) 171/58   Pulse: 69   Resp: 18   Temp: 97.8 F (36.6 C)   SpO2: 93% (  P) 92%   Body mass index is 32.1 kg/m.  General:  WDWN in NAD Gait: Not observed HENT: WNL, normocephalic Pulmonary: normal non-labored breathing Cardiac: regular rate and rhythm Abdomen:  soft, NT/ND, no masses Vascular Exam/Pulses: 2+ radial and brachial pulses bilaterally. Bilateral upper extremities well perfused and warm. 5/5 grip strength bilaterally Musculoskeletal: no muscle wasting or atrophy  Neurologic: A&O X 3;  No focal weakness or paresthesias are detected; speech is fluent/normal Psychiatric:  The pt has Normal affect. Lymph:  Unremarkable  CBC    Component Value Date/Time   WBC 7.1 07/25/2020 1637   RBC 3.21 (L) 07/25/2020 1637   HGB 9.2 (L) 07/25/2020 1637   HCT 27.5 (L) 07/25/2020 1637   PLT 341 07/25/2020 1637   MCV 85.7 07/25/2020 1637   MCH 28.7 07/25/2020 1637   MCHC 33.5 07/25/2020 1637   RDW 14.6 07/25/2020 1637   LYMPHSABS 1.8 06/01/2020 1038   MONOABS 0.9 06/01/2020 1038   EOSABS 0.1 06/01/2020 1038   BASOSABS 0.1 06/01/2020 1038    BMET    Component Value Date/Time   NA 141 07/26/2020 0235   NA 143 06/15/2020 1134   K 2.5 (LL) 07/26/2020 0608   CL 93 (L) 07/26/2020 0235   CO2 28 07/26/2020 0235   GLUCOSE 96 07/26/2020 0235   BUN 58 (H) 07/26/2020 0235   BUN 56 (H) 06/15/2020 1134   CREATININE 7.23 (H) 07/26/2020 0235   CREATININE 1.25 (H) 11/13/2016 1018   CALCIUM 6.3 (LL) 07/26/2020 0235    CALCIUM 5.8 (LL) 07/24/2020 0343   GFRNONAA 6 (L) 07/26/2020 0235   GFRNONAA 47 (L) 11/13/2016 1018   GFRAA 13 (L) 06/15/2020 1134   GFRAA 54 (L) 11/13/2016 1018    COAGS: Lab Results  Component Value Date   INR 0.92 10/22/2013   INR 0.9 04/29/2009     Non-Invasive Vascular Imaging:   Pending bilateral upper extremity vein mapping  Statin:  Yes.   Beta Blocker:  Yes.   Aspirin:  Yes.   ACEI:  No. ARB:  No. CCB use:  Yes Other antiplatelets/anticoagulants:  No.     ASSESSMENT/PLAN: This is a 64 y.o. 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 She was dialyzed yesterday 07/25/20 via a right IJ Temp HD cath and is scheduled to have [place] placed by IR today. She is right handed. I have ordered bilateral upper extremity vein mapping to assess if she has adequate conduit for a fistula. Hopefully she will have adequate veins for placement of a left upper extremity fistula. I did discuss with her potential need for AV graft and also need for placement in her right upper extremity if her veins are inadequate in her left arm. The patient is aware the risks include but are not limited to: bleeding, infection, steal syndrome, nerve damage, thrombosis, failure to mature, and need for additional procedures. I discussed with her also that she may likely need a second stage surgery following the initially placement of the fistula. Dr. Trula Slade, the vascular surgeon on call with follow up after her vein mapping to discuss with patient further surgical planning    Karoline Caldwell PA-C [facility] [phone] 07/26/2020  10:46 AM

## 2020-07-26 NOTE — Progress Notes (Signed)
Patient has progressed to End Stage Renal Disease   Will recommend outpatient dialysis center on discharge

## 2020-07-26 NOTE — Progress Notes (Signed)
Renal Navigator received notification from Dr. Webb/Nephrologist requesting outpatient HD referral for ESRD for patient. Referral submitted to [facility] Admissions to request treatment at clinic closest to patient's home that can accommodate a new patient. Renal Navigator will follow closely and meet with patient. Navigator notes that patient is listed as "Medicaid Potential." Navigator requests that unit CSW follow up with Financial Counselor to complete Medicaid application as soon as possible. Navigator assess patient's work history to provide to [employer]. Patient will require financial clearance before she will be cleared to treat in the outpatient clinic, which can take longer than the average referral.   Alphonzo Cruise, [place] Renal Navigator [phone]

## 2020-07-26 NOTE — Progress Notes (Signed)
Bilateral upper extremity vein mapping has been completed. Preliminary results can be found in CV Proc through chart review.   07/26/20 4:44 PM Carlos Levering RVT

## 2020-07-27 ENCOUNTER — Inpatient Hospital Stay (HOSPITAL_COMMUNITY): Payer: Medicaid Other

## 2020-07-27 ENCOUNTER — Inpatient Hospital Stay (HOSPITAL_COMMUNITY): Payer: Medicaid Other | Admitting: Certified Registered Nurse Anesthetist

## 2020-07-27 ENCOUNTER — Encounter (HOSPITAL_COMMUNITY)
Admission: EM | Disposition: A | Discharge status: Home / Self Care | Payer: Self-pay | Source: Home / Self Care | Attending: Internal Medicine

## 2020-07-27 ENCOUNTER — Encounter (HOSPITAL_COMMUNITY): Payer: Self-pay | Admitting: Internal Medicine

## 2020-07-27 DIAGNOSIS — N179 Acute kidney failure, unspecified: Secondary | ICD-10-CM

## 2020-07-27 HISTORY — PX: AV FISTULA PLACEMENT: SHX1204

## 2020-07-27 HISTORY — PX: INSERTION OF DIALYSIS CATHETER: SHX1324

## 2020-07-27 LAB — RENAL FUNCTION PANEL
Albumin: 1.9 g/dL — ABNORMAL LOW (ref 3.5–5.0)
Anion gap: 21 — ABNORMAL HIGH (ref 5–15)
BUN: 61 mg/dL — ABNORMAL HIGH (ref 8–23)
CO2: 27 mmol/L (ref 22–32)
Calcium: 7.2 mg/dL — ABNORMAL LOW (ref 8.9–10.3)
Chloride: 92 mmol/L — ABNORMAL LOW (ref 98–111)
Creatinine, Ser: 7.25 mg/dL — ABNORMAL HIGH (ref 0.44–1.00)
GFR, Estimated: 6 mL/min — ABNORMAL LOW (ref >60)
Glucose, Bld: 92 mg/dL (ref 70–99)
Phosphorus: 8.3 mg/dL — ABNORMAL HIGH (ref 2.5–4.6)
Potassium: 3 mmol/L — ABNORMAL LOW (ref 3.5–5.1)
Sodium: 140 mmol/L (ref 135–145)

## 2020-07-27 LAB — CBC
HCT: 28.2 % — ABNORMAL LOW (ref 36.0–46.0)
Hemoglobin: 9 g/dL — ABNORMAL LOW (ref 12.0–15.0)
MCH: 28 pg (ref 26.0–34.0)
MCHC: 31.9 g/dL (ref 30.0–36.0)
MCV: 87.6 fL (ref 80.0–100.0)
Platelets: 322 10*3/uL (ref 150–400)
RBC: 3.22 MIL/uL — ABNORMAL LOW (ref 3.87–5.11)
RDW: 14.4 % (ref 11.5–15.5)
WBC: 7.1 10*3/uL (ref 4.0–10.5)
nRBC: 0 % (ref 0.0–0.2)

## 2020-07-27 LAB — GLUCOSE, CAPILLARY
Glucose-Capillary: 90 mg/dL (ref 70–99)
Glucose-Capillary: 119 mg/dL — ABNORMAL HIGH (ref 70–99)
Glucose-Capillary: 154 mg/dL — ABNORMAL HIGH (ref 70–99)

## 2020-07-27 SURGERY — INSERTION OF DIALYSIS CATHETER
Anesthesia: General | Laterality: Right

## 2020-07-27 MED ORDER — HEPARIN SODIUM (PORCINE) 1000 UNIT/ML IJ SOLN
INTRAMUSCULAR | Status: DC | PRN
  Administered 2020-07-27: 5000 [IU] via INTRAVENOUS

## 2020-07-27 MED ORDER — CEFAZOLIN SODIUM-DEXTROSE 2-4 GM/100ML-% IV SOLN
2.0000 g | Freq: Once | INTRAVENOUS | Status: DC
Start: 2020-07-27 — End: 2020-07-27

## 2020-07-27 MED ORDER — DEXAMETHASONE SODIUM PHOSPHATE 10 MG/ML IJ SOLN
INTRAMUSCULAR | Status: DC | PRN
  Administered 2020-07-27: 5 mg via INTRAVENOUS

## 2020-07-27 MED ORDER — ROCURONIUM BROMIDE 100 MG/10ML IV SOLN
INTRAVENOUS | Status: DC | PRN
  Administered 2020-07-27: 30 mg via INTRAVENOUS

## 2020-07-27 MED ORDER — SODIUM CHLORIDE 0.9 % IV SOLN
INTRAVENOUS | Status: AC
  Filled 2020-07-27: qty 1.2

## 2020-07-27 MED ORDER — FENTANYL CITRATE (PF) 250 MCG/5ML IJ SOLN
INTRAMUSCULAR | Status: AC
  Filled 2020-07-27: qty 5

## 2020-07-27 MED ORDER — SODIUM CHLORIDE 0.9 % IV SOLN
INTRAVENOUS | Status: DC | PRN
  Administered 2020-07-27: 500 mL

## 2020-07-27 MED ORDER — OXYCODONE HCL 5 MG PO TABS: 5.0000 mg | ORAL_TABLET | Freq: Once | ORAL | Status: DC | PRN

## 2020-07-27 MED ORDER — CEFAZOLIN SODIUM-DEXTROSE 2-4 GM/100ML-% IV SOLN
2.0000 g | INTRAVENOUS | Status: AC
  Administered 2020-07-27: 2 g via INTRAVENOUS

## 2020-07-27 MED ORDER — OXYCODONE HCL 5 MG/5ML PO SOLN: 5.0000 mg | Freq: Once | ORAL | Status: DC | PRN

## 2020-07-27 MED ORDER — SUCCINYLCHOLINE CHLORIDE 20 MG/ML IJ SOLN
INTRAMUSCULAR | Status: DC | PRN
  Administered 2020-07-27: 120 mg via INTRAVENOUS

## 2020-07-27 MED ORDER — ORAL CARE MOUTH RINSE: 15.0000 mL | Freq: Once | OROMUCOSAL | Status: AC

## 2020-07-27 MED ORDER — ONDANSETRON HCL 4 MG/2ML IJ SOLN
4.0000 mg | Freq: Once | INTRAMUSCULAR | Status: AC | PRN
  Administered 2020-07-27: 4 mg via INTRAVENOUS

## 2020-07-27 MED ORDER — HEPARIN SODIUM (PORCINE) 1000 UNIT/ML IJ SOLN
INTRAMUSCULAR | Status: AC
  Filled 2020-07-27: qty 1

## 2020-07-27 MED ORDER — 0.9 % SODIUM CHLORIDE (POUR BTL) OPTIME
TOPICAL | Status: DC | PRN
  Administered 2020-07-27: 1000 mL

## 2020-07-27 MED ORDER — FENTANYL CITRATE (PF) 100 MCG/2ML IJ SOLN: 25.0000 ug | INTRAMUSCULAR | Status: DC | PRN

## 2020-07-27 MED ORDER — ONDANSETRON HCL 4 MG/2ML IJ SOLN
INTRAMUSCULAR | Status: AC
  Filled 2020-07-27: qty 2

## 2020-07-27 MED ORDER — EPHEDRINE 5 MG/ML INJ
INTRAVENOUS | Status: AC
  Filled 2020-07-27: qty 10

## 2020-07-27 MED ORDER — PROPOFOL 10 MG/ML IV BOLUS
INTRAVENOUS | Status: DC | PRN
  Administered 2020-07-27: 130 mg via INTRAVENOUS

## 2020-07-27 MED ORDER — CHLORHEXIDINE GLUCONATE 0.12 % MT SOLN
OROMUCOSAL | Status: AC
  Filled 2020-07-27: qty 15

## 2020-07-27 MED ORDER — PAPAVERINE HCL 30 MG/ML IJ SOLN
INTRAMUSCULAR | Status: AC
  Filled 2020-07-27: qty 2

## 2020-07-27 MED ORDER — FENTANYL CITRATE (PF) 250 MCG/5ML IJ SOLN
INTRAMUSCULAR | Status: DC | PRN
  Administered 2020-07-27 (×2): 50 ug via INTRAVENOUS
  Administered 2020-07-27 (×2): 25 ug via INTRAVENOUS

## 2020-07-27 MED ORDER — POTASSIUM CHLORIDE 10 MEQ/100ML IV SOLN
10.0000 meq | Freq: Once | INTRAVENOUS | Status: AC
  Administered 2020-07-27: 10 meq via INTRAVENOUS
  Filled 2020-07-27: qty 100

## 2020-07-27 MED ORDER — CHLORHEXIDINE GLUCONATE 0.12 % MT SOLN
15.0000 mL | Freq: Once | OROMUCOSAL | Status: AC
  Administered 2020-07-27: 15 mL via OROMUCOSAL

## 2020-07-27 MED ORDER — SUGAMMADEX SODIUM 200 MG/2ML IV SOLN
INTRAVENOUS | Status: DC | PRN
  Administered 2020-07-27: 200 mg via INTRAVENOUS

## 2020-07-27 MED ORDER — EPHEDRINE SULFATE-NACL 50-0.9 MG/10ML-% IV SOSY
PREFILLED_SYRINGE | INTRAVENOUS | Status: DC | PRN
  Administered 2020-07-27 (×2): 5 mg via INTRAVENOUS

## 2020-07-27 MED ORDER — PHENYLEPHRINE 40 MCG/ML (10ML) SYRINGE FOR IV PUSH (FOR BLOOD PRESSURE SUPPORT)
PREFILLED_SYRINGE | INTRAVENOUS | Status: AC
  Filled 2020-07-27: qty 10

## 2020-07-27 MED ORDER — HEPARIN SODIUM (PORCINE) 1000 UNIT/ML IJ SOLN
INTRAMUSCULAR | Status: DC | PRN
  Administered 2020-07-27: 3200 [IU]

## 2020-07-27 MED ORDER — INFLUENZA VAC SPLIT QUAD 0.5 ML IM SUSY
0.5000 mL | PREFILLED_SYRINGE | INTRAMUSCULAR | Status: AC
  Administered 2020-07-29: 0.5 mL via INTRAMUSCULAR
  Filled 2020-07-27: qty 0.5

## 2020-07-27 MED ORDER — LIDOCAINE 2% (20 MG/ML) 5 ML SYRINGE
INTRAMUSCULAR | Status: DC | PRN
  Administered 2020-07-27: 60 mg via INTRAVENOUS

## 2020-07-27 MED ORDER — SODIUM CHLORIDE 0.9 % IV SOLN: INTRAVENOUS | Status: DC

## 2020-07-27 MED ORDER — LIDOCAINE-EPINEPHRINE (PF) 1 %-1:200000 IJ SOLN
INTRAMUSCULAR | Status: AC
  Filled 2020-07-27: qty 30

## 2020-07-27 MED ORDER — CEFAZOLIN SODIUM-DEXTROSE 2-4 GM/100ML-% IV SOLN
INTRAVENOUS | Status: AC
  Filled 2020-07-27: qty 100

## 2020-07-27 MED ORDER — PNEUMOCOCCAL VAC POLYVALENT 25 MCG/0.5ML IJ INJ
0.5000 mL | INJECTION | INTRAMUSCULAR | Status: AC
  Administered 2020-07-29: 0.5 mL via INTRAMUSCULAR
  Filled 2020-07-27: qty 0.5

## 2020-07-27 MED ORDER — POTASSIUM CHLORIDE 10 MEQ/100ML IV SOLN: 10.0000 meq | INTRAVENOUS | Status: DC

## 2020-07-27 MED ORDER — PHENYLEPHRINE HCL-NACL 10-0.9 MG/250ML-% IV SOLN
INTRAVENOUS | Status: DC | PRN
  Administered 2020-07-27: 50 ug/min via INTRAVENOUS

## 2020-07-27 MED ORDER — ONDANSETRON HCL 4 MG/2ML IJ SOLN
INTRAMUSCULAR | Status: DC | PRN
  Administered 2020-07-27: 4 mg via INTRAVENOUS

## 2020-07-27 SURGICAL SUPPLY — 76 items
ADH SKN CLS APL DERMABOND .7 (GAUZE/BANDAGES/DRESSINGS) ×2
APL PRP STRL LF DISP 70% ISPRP (MISCELLANEOUS) ×2
APL SKNCLS STERI-STRIP NONHPOA (GAUZE/BANDAGES/DRESSINGS) ×2
ARMBAND PINK RESTRICT EXTREMIT (MISCELLANEOUS) ×4 IMPLANT
BAG DECANTER FOR FLEXI CONT (MISCELLANEOUS) ×4 IMPLANT
BENZOIN TINCTURE PRP APPL 2/3 (GAUZE/BANDAGES/DRESSINGS) ×4 IMPLANT
BIOPATCH RED 1 DISK 7.0 (GAUZE/BANDAGES/DRESSINGS) ×3 IMPLANT
BIOPATCH RED 1IN DISK 7.0MM (GAUZE/BANDAGES/DRESSINGS) ×1
CANISTER SUCT 3000ML PPV (MISCELLANEOUS) ×4 IMPLANT
CANNULA VESSEL 3MM 2 BLNT TIP (CANNULA) ×4 IMPLANT
CATH PALINDROME-P 19CM W/VT (CATHETERS) ×2 IMPLANT
CATH PALINDROME-P 23CM W/VT (CATHETERS) IMPLANT
CATH PALINDROME-P 28CM W/VT (CATHETERS) IMPLANT
CHLORAPREP W/TINT 26 (MISCELLANEOUS) ×4 IMPLANT
CLIP VESOCCLUDE MED 6/CT (CLIP) ×4 IMPLANT
CLIP VESOCCLUDE SM WIDE 6/CT (CLIP) ×4 IMPLANT
CLOSURE WOUND 1/2 X4 (GAUZE/BANDAGES/DRESSINGS) ×1
COVER PROBE W GEL 5X96 (DRAPES) ×4 IMPLANT
COVER SURGICAL LIGHT HANDLE (MISCELLANEOUS) ×4 IMPLANT
DERMABOND ADVANCED (GAUZE/BANDAGES/DRESSINGS) ×2
DERMABOND ADVANCED .7 DNX12 (GAUZE/BANDAGES/DRESSINGS) IMPLANT
DRAPE C-ARM 42X72 X-RAY (DRAPES) ×4 IMPLANT
DRAPE CHEST BREAST 15X10 FENES (DRAPES) ×4 IMPLANT
DRAPE EXTREMITY T 121X128X90 (DISPOSABLE) ×4 IMPLANT
DRAPE IMP U-DRAPE 54X76 (DRAPES) ×2 IMPLANT
ELECT REM PT RETURN 9FT ADLT (ELECTROSURGICAL) ×4
ELECTRODE REM PT RTRN 9FT ADLT (ELECTROSURGICAL) ×2 IMPLANT
GAUZE 4X4 16PLY RFD (DISPOSABLE) ×4 IMPLANT
GAUZE SPONGE 4X4 12PLY STRL (GAUZE/BANDAGES/DRESSINGS) ×4 IMPLANT
GLOVE BIOGEL PI IND STRL 6.5 (GLOVE) IMPLANT
GLOVE BIOGEL PI IND STRL 7.5 (GLOVE) IMPLANT
GLOVE BIOGEL PI IND STRL 8 (GLOVE) IMPLANT
GLOVE BIOGEL PI INDICATOR 6.5 (GLOVE) ×4
GLOVE BIOGEL PI INDICATOR 7.5 (GLOVE) ×4
GLOVE BIOGEL PI INDICATOR 8 (GLOVE) ×2
GLOVE ECLIPSE 6.5 STRL STRAW (GLOVE) ×4 IMPLANT
GLOVE ECLIPSE 7.0 STRL STRAW (GLOVE) ×4 IMPLANT
GLOVE INDICATOR 7.0 STRL GRN (GLOVE) ×12 IMPLANT
GLOVE SURG SS PI 6.5 STRL IVOR (GLOVE) ×2 IMPLANT
GLOVE SURG SS PI 8.0 STRL IVOR (GLOVE) ×4 IMPLANT
GOWN STRL REUS W/ TWL LRG LVL3 (GOWN DISPOSABLE) ×4 IMPLANT
GOWN STRL REUS W/ TWL XL LVL3 (GOWN DISPOSABLE) ×2 IMPLANT
GOWN STRL REUS W/TWL 2XL LVL3 (GOWN DISPOSABLE) ×2 IMPLANT
GOWN STRL REUS W/TWL LRG LVL3 (GOWN DISPOSABLE) ×16
GOWN STRL REUS W/TWL XL LVL3 (GOWN DISPOSABLE) ×4
INSERT FOGARTY SM (MISCELLANEOUS) IMPLANT
KIT BASIN OR (CUSTOM PROCEDURE TRAY) ×4 IMPLANT
KIT PALINDROME-P 55CM (CATHETERS) IMPLANT
KIT TURNOVER KIT B (KITS) ×4 IMPLANT
LOOP VESSEL MINI RED (MISCELLANEOUS) ×2 IMPLANT
NDL 18GX1X1/2 (RX/OR ONLY) (NEEDLE) ×2 IMPLANT
NDL HYPO 25GX1X1/2 BEV (NEEDLE) ×2 IMPLANT
NEEDLE 18GX1X1/2 (RX/OR ONLY) (NEEDLE) ×8 IMPLANT
NEEDLE HYPO 25GX1X1/2 BEV (NEEDLE) ×4 IMPLANT
NS IRRIG 1000ML POUR BTL (IV SOLUTION) ×4 IMPLANT
PACK CV ACCESS (CUSTOM PROCEDURE TRAY) ×4 IMPLANT
PACK SURGICAL SETUP 50X90 (CUSTOM PROCEDURE TRAY) ×4 IMPLANT
PAD ARMBOARD 7.5X6 YLW CONV (MISCELLANEOUS) ×8 IMPLANT
PENCIL SMOKE EVACUATOR (MISCELLANEOUS) ×4 IMPLANT
SET MICROPUNCTURE 5F STIFF (MISCELLANEOUS) ×4 IMPLANT
SOAP 2 % CHG 4 OZ (WOUND CARE) ×4 IMPLANT
STRIP CLOSURE SKIN 1/2X4 (GAUZE/BANDAGES/DRESSINGS) ×3 IMPLANT
SUT ETHILON 3 0 PS 1 (SUTURE) ×4 IMPLANT
SUT MNCRL AB 4-0 PS2 18 (SUTURE) ×8 IMPLANT
SUT PROLENE 6 0 BV (SUTURE) ×4 IMPLANT
SUT VIC AB 3-0 SH 27 (SUTURE) ×4
SUT VIC AB 3-0 SH 27X BRD (SUTURE) ×2 IMPLANT
SYR 10ML LL (SYRINGE) ×6 IMPLANT
SYR 20ML LL LF (SYRINGE) ×8 IMPLANT
SYR 5ML LL (SYRINGE) ×4 IMPLANT
SYR BULB IRRIG 60ML STRL (SYRINGE) ×2 IMPLANT
SYR CONTROL 10ML LL (SYRINGE) ×4 IMPLANT
TOWEL GREEN STERILE (TOWEL DISPOSABLE) ×4 IMPLANT
TOWEL GREEN STERILE FF (TOWEL DISPOSABLE) ×8 IMPLANT
UNDERPAD 30X36 HEAVY ABSORB (UNDERPADS AND DIAPERS) ×4 IMPLANT
WATER STERILE IRR 1000ML POUR (IV SOLUTION) ×4 IMPLANT

## 2020-07-27 NOTE — H&P (Signed)
Reason for Consult:  Permanent dialysis access Requesting Physician: Dr. Edrick Oh MRN #:  [id number]  History of Present Illness: This is a 64 y.o. female with multiple medical conditions including Diabetes mellitus, hypertension, stroke, and chronic kidney disease secondary to diabetes. She presented to ER with several month history of nausea and vomiting. She does follow GI for gastroparesis. She is now felt to be at high risk of worsening renal failure and likely now end stage. She has not previously had hemodialysis prior to this admission. She did have Temp HD catheter placed and dialyzed yesterday for the first time. She denies any prior access surgery or central lines.  She is right handed. She has no history of upper extremity surgeries or disability. We have been asked to see her for permanent dialysis access placement      Past Medical History:  Diagnosis Date  . Hypertension   . Stroke [facility])          Past Surgical History:  Procedure Laterality Date  . CESAREAN SECTION    . ESOPHAGOGASTRODUODENOSCOPY (EGD) WITH PROPOFOL N/A 05/26/2020   Procedure: ESOPHAGOGASTRODUODENOSCOPY (EGD) WITH PROPOFOL;  Surgeon: Irene Shipper, MD;  Location: [facility] ENDOSCOPY;  Service: Endoscopy;  Laterality: N/A;  . IR FLUORO GUIDE CV LINE RIGHT  07/25/2020  . IR US GUIDE VASC ACCESS RIGHT  07/25/2020         Allergies  Allergen Reactions  . Codeine Nausea And Vomiting  . Adhesive [Tape] Other (See Comments)    Tape breaks out the skin if it is left on for a lengthy period of time           Prior to Admission medications   Medication Sig Start Date End Date Taking? Authorizing Provider  aspirin 325 MG tablet Take 1 tablet (325 mg total) by mouth daily. 11/25/18  Yes McClung, Angela M, PA-C  atorvastatin (LIPITOR) 40 MG tablet TAKE 1 TABLET BY MOUTH DAILY AT 6 PM. Patient taking differently: Take 40 mg by mouth every evening.  05/23/20  Yes Charlott Rakes, MD  carvedilol (COREG)  12.5 MG tablet Take 1 tablet (12.5 mg total) by mouth 2 (two) times daily with a meal. 06/15/20  Yes Newlin, Enobong, MD  clarithromycin (BIAXIN) 500 MG tablet Take 1 tablet (500 mg total) by mouth 2 (two) times daily. Hold atorvastatin while on clarithromycin 06/15/20  Yes Newlin, Enobong, MD  DULoxetine (CYMBALTA) 60 MG capsule Take 1 capsule (60 mg total) by mouth daily. 05/23/20  Yes Charlott Rakes, MD  metoCLOPramide (REGLAN) 5 MG tablet Take 1 tablet (5 mg total) by mouth 4 (four) times daily -  before meals and at bedtime. Patient taking differently: Take 5 mg by mouth every 6 (six) hours as needed for nausea or vomiting.  06/28/20  Yes Willia Craze, NP  omeprazole (PRILOSEC) 20 MG capsule Take 1 capsule (20 mg total) by mouth 2 (two) times daily before a meal. 06/15/20  Yes Newlin, Enobong, MD  ondansetron (ZOFRAN ODT) 4 MG disintegrating tablet Take 1 tablet (4 mg total) by mouth every 8 (eight) hours as needed for nausea or vomiting. 07/18/20  Yes Willia Craze, NP  promethazine (PHENERGAN) 25 MG suppository Place 1 suppository (25 mg total) rectally every 8 (eight) hours as needed for nausea or vomiting. 05/23/20  Yes Charlott Rakes, MD  amoxicillin (AMOXIL) 500 MG capsule Take 2 capsules (1,000 mg total) by mouth 2 (two) times daily. Patient not taking: Reported on 07/23/2020 06/15/20   Charlott Rakes,  MD  Blood Glucose Monitoring Suppl (BLOOD GLUCOSE METER) kit Use as instructed 11/01/13   Reyne Dumas, MD  Blood Glucose Monitoring Suppl (TRUE METRIX METER) DEVI 1 each by Does not apply route 3 (three) times daily before meals. 06/02/20   Charlott Rakes, MD  cetirizine (ZYRTEC) 10 MG tablet Take 1 tablet (10 mg total) by mouth daily. Patient not taking: Reported on 07/23/2020 11/13/16   Charlott Rakes, MD  glucose blood (TRUETEST TEST) test strip Use as instructed 06/02/20   Charlott Rakes, MD  Lancets (FREESTYLE) lancets Use as instructed 11/01/13   Reyne Dumas,  MD  Na Sulfate-K Sulfate-Mg Sulf (SUPREP BOWEL PREP KIT) 17.5-3.13-1.6 GM/177ML SOLN Take 1 kit by mouth as directed. For colonoscopy prep 06/28/20   Willia Craze, NP  Olopatadine HCl (PATADAY) 0.2 % SOLN Apply 1 drop to eye daily. Patient not taking: Reported on 07/23/2020 11/13/16   Charlott Rakes, MD  TRUEplus Lancets 28G MISC 1 each by Does not apply route 3 (three) times daily before meals. 06/02/20   Charlott Rakes, MD    Social History        Socioeconomic History  . Marital status: Divorced    Spouse name: Not on file  . Number of children: Not on file  . Years of education: Not on file  . Highest education level: Not on file  Occupational History  . Not on file  Tobacco Use  . Smoking status: Current Every Day Smoker    Packs/day: 0.25    Years: 10.00    Pack years: 2.50    Types: Cigarettes  . Smokeless tobacco: Never Used  . Tobacco comment: 5 cigs daily  Substance and Sexual Activity  . Alcohol use: No  . Drug use: No  . Sexual activity: Not on file  Other Topics Concern  . Not on file  Social History Narrative  . Not on file   Social Determinants of Health      Financial Resource Strain:   . Difficulty of Paying Living Expenses: Not on file  Food Insecurity:   . Worried About Charity fundraiser in the Last Year: Not on file  . Ran Out of Food in the Last Year: Not on file  Transportation Needs:   . Lack of Transportation (Medical): Not on file  . Lack of Transportation (Non-Medical): Not on file  Physical Activity:   . Days of Exercise per Week: Not on file  . Minutes of Exercise per Session: Not on file  Stress:   . Feeling of Stress : Not on file  Social Connections:   . Frequency of Communication with Friends and Family: Not on file  . Frequency of Social Gatherings with Friends and Family: Not on file  . Attends Religious Services: Not on file  . Active Member of Clubs or Organizations: Not on file  . Attends Theatre manager Meetings: Not on file  . Marital Status: Not on file  Intimate Partner Violence:   . Fear of Current or Ex-Partner: Not on file  . Emotionally Abused: Not on file  . Physically Abused: Not on file  . Sexually Abused: Not on file          Family History  Problem Relation Age of Onset  . Stroke Mother   . Hypertension Mother   . Hyperlipidemia Mother   . Hypertension Father   . Hyperlipidemia Father     ROS: Otherwise negative unless mentioned in HPI  Physical Examination  Vitals:   07/26/20 0328 07/26/20 0800  BP: (!) 171/58   Pulse: 69   Resp: 18   Temp: 97.8 F (36.6 C)   SpO2: 93% (P) 92%   Body mass index is 32.1 kg/m.  General:  WDWN in NAD Gait: Not observed HENT: WNL, normocephalic Pulmonary: normal non-labored breathing Cardiac: regular rate and rhythm Abdomen:  soft, NT/ND, no masses Vascular Exam/Pulses: 2+ radial and brachial pulses bilaterally. Bilateral upper extremities well perfused and warm. 5/5 grip strength bilaterally Musculoskeletal: no muscle wasting or atrophy       Neurologic: A&O X 3;  No focal weakness or paresthesias are detected; speech is fluent/normal Psychiatric:  The pt has Normal affect. Lymph:  Unremarkable  CBC Labs (Brief)          Component Value Date/Time   WBC 7.1 07/25/2020 1637   RBC 3.21 (L) 07/25/2020 1637   HGB 9.2 (L) 07/25/2020 1637   HCT 27.5 (L) 07/25/2020 1637   PLT 341 07/25/2020 1637   MCV 85.7 07/25/2020 1637   MCH 28.7 07/25/2020 1637   MCHC 33.5 07/25/2020 1637   RDW 14.6 07/25/2020 1637   LYMPHSABS 1.8 06/01/2020 1038   MONOABS 0.9 06/01/2020 1038   EOSABS 0.1 06/01/2020 1038   BASOSABS 0.1 06/01/2020 1038      BMET Labs (Brief)          Component Value Date/Time   NA 141 07/26/2020 0235   NA 143 06/15/2020 1134   K 2.5 (LL) 07/26/2020 0608   CL 93 (L) 07/26/2020 0235   CO2 28 07/26/2020 0235   GLUCOSE 96 07/26/2020 0235   BUN  58 (H) 07/26/2020 0235   BUN 56 (H) 06/15/2020 1134   CREATININE 7.23 (H) 07/26/2020 0235   CREATININE 1.25 (H) 11/13/2016 1018   CALCIUM 6.3 (LL) 07/26/2020 0235   CALCIUM 5.8 (LL) 07/24/2020 0343   GFRNONAA 6 (L) 07/26/2020 0235   GFRNONAA 47 (L) 11/13/2016 1018   GFRAA 13 (L) 06/15/2020 1134   GFRAA 54 (L) 11/13/2016 1018      COAGS: Recent Labs       Lab Results  Component Value Date   INR 0.92 10/22/2013   INR 0.9 04/29/2009       Non-Invasive Vascular Imaging:   Pending bilateral upper extremity vein mapping  Statin:  Yes.   Beta Blocker:  Yes.   Aspirin:  Yes.   ACEI:  No. ARB:  No. CCB use:  Yes Other antiplatelets/anticoagulants:  No.    ASSESSMENT/PLAN: This is a 64 y.o. 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 RIJ TDC and LUE BC AVF today.

## 2020-07-27 NOTE — Progress Notes (Signed)
Renal Navigator met with patient to introduce role, offer support and inform that outpatient HD referral is in process. Navigator explained that it may take longer than usual since she does not have insurance on file at this time. She reports that a Development worker, community met with her yesterday to complete a Medicaid application. She also reports that she has approximately 15 years of work history. Navigator will report this to [employer] financial team.  Navigator will follow up once patient is financially cleared and assigned to a seat in an outpatient HD clinic. Patient appreciative. She reports that she has her own transportation. She requests a second shift seat as she states her gastroparesis causes nausea in the mornings.   Tina Gomez, [place] Renal Navigator [phone]

## 2020-07-27 NOTE — Transfer of Care (Signed)
Immediate Anesthesia Transfer of Care Note  Patient: Tina Gomez  Procedure(s) Performed: INSERTION OF RIGHT INTERNAL JUGULAR TUNNELED DIALYSIS CATHETER (Right ) LEFT BRACHIO-BASILIC ARTERIOVENOUS (AV) FISTULA CREATION (Left )  Patient Location: PACU  Anesthesia Type:General  Level of Consciousness: drowsy  Airway & Oxygen Therapy: Patient Spontanous Breathing and Patient connected to face mask oxygen  Post-op Assessment: Report given to RN and Post -op Vital signs reviewed and stable  Post vital signs: Reviewed  Last Vitals:  Vitals Value Taken Time  BP 170/67 07/27/20 1302  Temp    Pulse 61 07/27/20 1304  Resp 21 07/27/20 1304  SpO2 98 % 07/27/20 1304  Vitals shown include unvalidated device data.  Last Pain:  Vitals:   07/27/20 1012  TempSrc:   PainSc: 0-No pain         Complications: No complications documented.

## 2020-07-27 NOTE — Anesthesia Procedure Notes (Signed)
Procedure Name: Intubation Date/Time: 07/27/2020 11:02 AM Performed by: Janene Harvey, CRNA Pre-anesthesia Checklist: Patient identified, Emergency Drugs available, Suction available and Patient being monitored Patient Re-evaluated:Patient Re-evaluated prior to induction Oxygen Delivery Method: Circle system utilized Preoxygenation: Pre-oxygenation with 100% oxygen Induction Type: IV induction, Rapid sequence and Cricoid Pressure applied Laryngoscope Size: Mac and 4 Grade View: Grade I Tube type: Oral Tube size: 7.0 mm Number of attempts: 1 Airway Equipment and Method: Stylet and Oral airway Placement Confirmation: ETT inserted through vocal cords under direct vision,  positive ETCO2 and breath sounds checked- equal and bilateral Secured at: 21 cm Tube secured with: Tape Dental Injury: Teeth and Oropharynx as per pre-operative assessment

## 2020-07-27 NOTE — Progress Notes (Signed)
Plan for LUE brachio-cephalic arteriovenous fistula creation and tunneled dialysis catheter placement today in OR. I discussed risks / benefits / alternatives to access. We specifically discussed risk of steal syndrome and need for maintenance of HD access. She is understanding and wishes to proceed.  Tina Gomez. Stanford Breed, MD [facility] Phone Number: [phone] 07/27/2020 10:04 AM

## 2020-07-27 NOTE — Plan of Care (Signed)
  Problem: Education: Goal: Knowledge of disease and its progression will improve Outcome: Progressing   Problem: Health Behavior/Discharge Planning: Goal: Ability to manage health-related needs will improve Outcome: Progressing   Problem: Clinical Measurements: Goal: Complications related to the disease process or treatment will be avoided or minimized Outcome: Progressing   Problem: Activity: Goal: Activity intolerance will improve Outcome: Progressing

## 2020-07-27 NOTE — Progress Notes (Signed)
Discussed with Dr Donnetta Hutching  ;  Will place tunneled catheter  Appreciate assistance    Will make patient NPO

## 2020-07-27 NOTE — Progress Notes (Addendum)
[facility] ROUNDING NOTE   Subjective:   Brief history 64 year old lady with history of diabetes hypertension gastroparesis stroke CKD stage III with nausea vomiting decreased oral intake.  She has a history of gastroparesis followed by GI.  Longstanding history of CKD with recent progression baseline serum creatinine by 3.44 mg/dL does not have a nephrologist but recently referred to [facility].  On arrival in the emergency room blood pressure was elevated BUN 138 creatinine elevated to 12.49.  She is not on ACE inhibitor or ARB at denies NSAID use.  She appeared clinically dehydrated and was placed on IV sodium bicarbonate 150 cc an hour.  Renal ultrasound did not show any evidence of hydronephrosis but diffuse increased cortical echogenicity.  Patient deemed end-stage renal disease.  For tunneled catheter and assessment for fistula placement.    Dialysis aborted today and temporary catheter is not running  Will have a permanent catheter placed   Subjective She underwent dialysis 07/25/2020 with 1 L ultrafiltered.  We will proceed with dialysis 07/27/2020   Blood pressure 174/72 pulse 62 temperature 97.8 O2 sats 94% room air  Sodium 140 potassium 3.0 chloride 92 CO2 27 BUN 61 creatinine 7.25 glucose 92 calcium 7.2 phosphorus 8.3 albumin 1.9 hemoglobin 9  Urine output 1.2 L 07/26/2020  Objective:  Vital signs in last 24 hours:  Temp:  [97.7 F (36.5 C)-98 F (36.7 C)] 97.8 F (36.6 C) (11/18 0612) Pulse Rate:  [62-66] 62 (11/18 0612) Resp:  [16-20] 16 (11/18 0612) BP: (164-174)/(66-74) 174/72 (11/18 0612) SpO2:  [92 %-96 %] 94 % (11/18 0612)  Weight change:  Filed Weights   07/23/20 0213 07/25/20 1220 07/25/20 1438  Weight: 85 kg 80.7 kg 79.6 kg    Intake/Output: I/O last 3 completed shifts: In: 200 [IV Piggyback:200] Out: 1800 [Urine:1800]   Intake/Output this shift:  No intake/output data recorded.  General exam: Ill-looking female lying  on bed, dry mucous membrane. Respiratory system: Clear to auscultation. Respiratory effort normal. No wheezing or crackle Cardiovascular system: S1 & S2 heard, RRR.  No pedal edema. Gastrointestinal system: Abdomen is nondistended, soft and nontender. Normal bowel sounds heard. Central nervous system: Alert and oriented. No focal neurological deficits. Extremities: Symmetric 5 x 5 power. Skin: No rashes, lesions or ulcers Psychiatry: Judgement and insight appear normal. Mood & affect appropriate   Basic Metabolic Panel: Recent Labs  Lab 07/24/20 0418 07/24/20 0418 07/25/20 0341 07/25/20 0341 07/26/20 0235 07/26/20 0608 07/26/20 1322 07/26/20 1706 07/27/20 0033  NA 144  --  140  --  141  --   --  140 140  K 3.6   < > 3.0*  --  2.5* 2.5*  --  2.9* 3.0*  CL 107  --  92*  --  93*  --   --  91* 92*  CO2 14*  --  24  --  28  --   --  28 27  GLUCOSE 66*  --  71  --  96  --   --  138* 92  BUN 125*  --  113*  --  58*  --   --  62* 61*  CREATININE 11.32*  --  10.51*  --  7.23*  --   --  7.10* 7.25*  CALCIUM 6.0*   < > 5.6*   < > 6.3*  --   --  7.1* 7.2*  MG  --   --  1.8  --   --   --  1.6*  --   --  PHOS >30.0*  --  >30.0*  --  8.3*  --   --  8.1* 8.3*   < > = values in this interval not displayed.    Liver Function Tests: Recent Labs  Lab 07/23/20 0239 07/23/20 0239 07/24/20 0418 07/25/20 0341 07/26/20 0235 07/26/20 1706 07/27/20 0033  AST 8*  --   --   --   --   --   --   ALT 6  --   --   --   --   --   --   ALKPHOS 82  --   --   --   --   --   --   BILITOT 0.3  --   --   --   --   --   --   PROT 6.1*  --   --   --   --   --   --   ALBUMIN 2.0*   < > 1.7* 1.8* 1.8* 2.0* 1.9*   < > = values in this interval not displayed.   Recent Labs  Lab 07/23/20 0239  LIPASE 52*   No results for input(s): AMMONIA in the last 168 hours.  CBC: Recent Labs  Lab 07/23/20 1110 07/24/20 0343 07/25/20 0341 07/25/20 1637 07/27/20 0033  WBC 10.3 6.8 7.2 7.1 7.1  HGB 8.3* 8.2*  8.3* 9.2* 9.0*  HCT 27.1* 26.3* 25.2* 27.5* 28.2*  MCV 90.0 88.3 84.6 85.7 87.6  PLT 320 322 303 341 322    Cardiac Enzymes: Recent Labs  Lab 07/24/20 0343  CKTOTAL 47    BNP: Invalid input(s): POCBNP  CBG: Recent Labs  Lab 07/26/20 0424 07/26/20 1239  GLUCAP 91 103*    Microbiology: Results for orders placed or performed during the hospital encounter of 07/23/20  Respiratory Panel by RT PCR (Flu A&B, Covid) - Nasopharyngeal Swab     Status: None   Collection Time: 07/23/20  6:20 AM   Specimen: Nasopharyngeal Swab  Result Value Ref Range Status   SARS Coronavirus 2 by RT PCR NEGATIVE NEGATIVE Final    Comment: (NOTE) SARS-CoV-2 target nucleic acids are NOT DETECTED.  The SARS-CoV-2 RNA is generally detectable in upper respiratoy specimens during the acute phase of infection. The lowest concentration of SARS-CoV-2 viral copies this assay can detect is 131 copies/mL. A negative result does not preclude SARS-Cov-2 infection and should not be used as the sole basis for treatment or other patient management decisions. A negative result may occur with  improper specimen collection/handling, submission of specimen other than nasopharyngeal swab, presence of viral mutation(s) within the areas targeted by this assay, and inadequate number of viral copies (<131 copies/mL). A negative result must be combined with clinical observations, patient history, and epidemiological information. The expected result is Negative.  Fact Sheet for Patients:  PinkCheek.be  Fact Sheet for Healthcare Providers:  GravelBags.it  This test is no t yet approved or cleared by the [place] FDA and  has been authorized for detection and/or diagnosis of SARS-CoV-2 by FDA under an Emergency Use Authorization (EUA). This EUA will remain  in effect (meaning this test can be used) for the duration of the COVID-19 declaration under Section  564(b)(1) of the Act, 21 U.S.C. section 360bbb-3(b)(1), unless the authorization is terminated or revoked sooner.     Influenza A by PCR NEGATIVE NEGATIVE Final   Influenza B by PCR NEGATIVE NEGATIVE Final    Comment: (NOTE) The Xpert Xpress SARS-CoV-2/FLU/RSV assay is intended as an aid  in  the diagnosis of influenza from Nasopharyngeal swab specimens and  should not be used as a sole basis for treatment. Nasal washings and  aspirates are unacceptable for Xpert Xpress SARS-CoV-2/FLU/RSV  testing.  Fact Sheet for Patients: PinkCheek.be  Fact Sheet for Healthcare Providers: GravelBags.it  This test is not yet approved or cleared by the [place] FDA and  has been authorized for detection and/or diagnosis of SARS-CoV-2 by  FDA under an Emergency Use Authorization (EUA). This EUA will remain  in effect (meaning this test can be used) for the duration of the  Covid-19 declaration under Section 564(b)(1) of the Act, 21  U.S.C. section 360bbb-3(b)(1), unless the authorization is  terminated or revoked. Performed at [facility], [place] [address], [place], [place] [address]     Coagulation Studies: No results for input(s): LABPROT, INR in the last 72 hours.  Urinalysis: No results for input(s): COLORURINE, LABSPEC, PHURINE, GLUCOSEU, HGBUR, BILIRUBINUR, KETONESUR, PROTEINUR, UROBILINOGEN, NITRITE, LEUKOCYTESUR in the last 72 hours.  Invalid input(s): APPERANCEUR    Imaging: IR Fluoro Guide CV Line Right  Result Date: 07/25/2020 INDICATION: Acute kidney injury, access for dialysis EXAM: ULTRASOUND AND FLUOROSCOPIC RIGHT IJ TEMPORARY DIALYSIS CATHETER [facility] CATHETER) MEDICATIONS: 1% LIDOCAINE LOCAL ANESTHESIA/SEDATION: None. FLUOROSCOPY TIME:  Fluoroscopy Time: 0 minutes 12 seconds (1 mGy). COMPLICATIONS: None immediate. PROCEDURE: Informed written consent was obtained from the patient after a thorough discussion  of the procedural risks, benefits and alternatives. All questions were addressed. Maximal Sterile Barrier Technique was utilized including caps, mask, sterile gowns, sterile gloves, sterile drape, hand hygiene and skin antiseptic. A timeout was performed prior to the initiation of the procedure. Under sterile conditions and local anesthesia, ultrasound micropuncture access performed of right internal jugular vein. Images obtained for documentation of the patent right internal jugular vein. 018 guidewire inserted followed by 4 French micro dilator. Amplatz guidewire inserted. Measurements obtained. Tract dilatation performed to insert a 16 cm temporary Mahurkar dialysis catheter. Tip position SVC RA junction. Catheter secured with 2 0 Ethilon sutures and a sterile dressing. Blood aspirated easily followed by saline and heparin flushes. External caps applied. No immediate complication. Patient tolerated the procedure well IMPRESSION: Successful ultrasound fluoroscopic right IJ temporary dialysis catheter (16 cm Mahurkar catheter). Tip SVC RA junction. Ready for use. Electronically Signed   By: Jerilynn Mages.  Shick M.D.   On: 07/25/2020 12:09   IR US Guide Vasc Access Right  Result Date: 07/25/2020 INDICATION: Acute kidney injury, access for dialysis EXAM: ULTRASOUND AND FLUOROSCOPIC RIGHT IJ TEMPORARY DIALYSIS CATHETER [facility] CATHETER) MEDICATIONS: 1% LIDOCAINE LOCAL ANESTHESIA/SEDATION: None. FLUOROSCOPY TIME:  Fluoroscopy Time: 0 minutes 12 seconds (1 mGy). COMPLICATIONS: None immediate. PROCEDURE: Informed written consent was obtained from the patient after a thorough discussion of the procedural risks, benefits and alternatives. All questions were addressed. Maximal Sterile Barrier Technique was utilized including caps, mask, sterile gowns, sterile gloves, sterile drape, hand hygiene and skin antiseptic. A timeout was performed prior to the initiation of the procedure. Under sterile conditions and local anesthesia,  ultrasound micropuncture access performed of right internal jugular vein. Images obtained for documentation of the patent right internal jugular vein. 018 guidewire inserted followed by 4 French micro dilator. Amplatz guidewire inserted. Measurements obtained. Tract dilatation performed to insert a 16 cm temporary Mahurkar dialysis catheter. Tip position SVC RA junction. Catheter secured with 2 0 Ethilon sutures and a sterile dressing. Blood aspirated easily followed by saline and heparin flushes. External caps applied. No immediate complication. Patient tolerated the  procedure well IMPRESSION: Successful ultrasound fluoroscopic right IJ temporary dialysis catheter (16 cm Mahurkar catheter). Tip SVC RA junction. Ready for use. Electronically Signed   By: Jerilynn Mages.  Shick M.D.   On: 07/25/2020 12:09   VAS [place] UPPER EXT VEIN MAPPING (PRE-OP AVF)  Result Date: 07/26/2020 UPPER EXTREMITY VEIN MAPPING  Indications: Pre-access. Limitations: IV's, bandages Comparison Study: No prior studies. Performing Technologist: Oliver Hum RVT  Examination Guidelines: A complete evaluation includes B-mode imaging, spectral Doppler, color Doppler, and power Doppler as needed of all accessible portions of each vessel. Bilateral testing is considered an integral part of a complete examination. Limited examinations for reoccurring indications may be performed as noted. +-----------------+-------------+----------+---------+ Right Cephalic   Diameter (cm)Depth (cm)Findings  +-----------------+-------------+----------+---------+ Shoulder             0.33        1.04             +-----------------+-------------+----------+---------+ Prox upper arm       0.30        0.32   branching +-----------------+-------------+----------+---------+ Mid upper arm        0.26        0.50             +-----------------+-------------+----------+---------+ Dist upper arm       0.41        0.64              +-----------------+-------------+----------+---------+ Antecubital fossa    0.37        0.54             +-----------------+-------------+----------+---------+ Prox forearm         0.33        0.40   Thrombus  +-----------------+-------------+----------+---------+ Mid forearm          0.19        0.44             +-----------------+-------------+----------+---------+ Dist forearm         0.35        0.31             +-----------------+-------------+----------+---------+ +-----------------+-------------+----------+---------+ Right Basilic    Diameter (cm)Depth (cm)Findings  +-----------------+-------------+----------+---------+ Shoulder             0.36        0.97             +-----------------+-------------+----------+---------+ Mid upper arm        0.39        1.00             +-----------------+-------------+----------+---------+ Dist upper arm       0.33        0.85   branching +-----------------+-------------+----------+---------+ Antecubital fossa    0.21        0.77   branching +-----------------+-------------+----------+---------+ Prox forearm         0.14        0.22   branching +-----------------+-------------+----------+---------+ Mid forearm          0.09        0.19             +-----------------+-------------+----------+---------+ Distal forearm       0.07        0.13             +-----------------+-------------+----------+---------+ +-----------------+-------------+----------+----------------------+ Left Cephalic    Diameter (cm)Depth (cm)       Findings        +-----------------+-------------+----------+----------------------+ Shoulder  0.31        1.10                          +-----------------+-------------+----------+----------------------+ Prox upper arm       0.36        0.55         branching        +-----------------+-------------+----------+----------------------+ Mid upper arm        0.35        0.52                           +-----------------+-------------+----------+----------------------+ Dist upper arm       0.39        0.34                          +-----------------+-------------+----------+----------------------+ Antecubital fossa    0.45        0.45   branching and Thrombus +-----------------+-------------+----------+----------------------+ Prox forearm         0.29        0.50         branching        +-----------------+-------------+----------+----------------------+ Mid forearm          0.17        0.46                          +-----------------+-------------+----------+----------------------+ Dist forearm         0.13        0.21                          +-----------------+-------------+----------+----------------------+ +-----------------+-------------+----------+---------+ Left Basilic     Diameter (cm)Depth (cm)Findings  +-----------------+-------------+----------+---------+ Shoulder             0.69        1.40             +-----------------+-------------+----------+---------+ Prox upper arm       0.33        1.40             +-----------------+-------------+----------+---------+ Mid upper arm        0.26        1.20             +-----------------+-------------+----------+---------+ Dist upper arm       0.25        0.91             +-----------------+-------------+----------+---------+ Antecubital fossa    0.15        0.65   branching +-----------------+-------------+----------+---------+ Prox forearm         0.14        0.43   branching +-----------------+-------------+----------+---------+ Mid forearm          0.05        0.31             +-----------------+-------------+----------+---------+ Distal forearm       0.05        0.21             +-----------------+-------------+----------+---------+ *See table(s) above for measurements and observations.  Diagnosing physician: Harold Barban MD Electronically signed by Harold Barban MD on 07/26/2020 at 8:17:17 PM.    Final      Medications:   . sodium chloride    . sodium chloride    .  potassium chloride 10 mEq (07/27/20 0636)   . amLODipine  10 mg Oral Daily  . aspirin  325 mg Oral Daily  . atorvastatin  40 mg Oral QPM  . calcium acetate  2,001 mg Oral TID WC  . calcium carbonate  1 tablet Oral BID WC  . carvedilol  12.5 mg Oral BID WC  . Chlorhexidine Gluconate Cloth  6 each Topical Q0600  . Chlorhexidine Gluconate Cloth  6 each Topical Q0600  . darbepoetin (ARANESP) injection - NON-DIALYSIS  150 mcg Subcutaneous Q Mon-1800  . DULoxetine  60 mg Oral Daily  . heparin  5,000 Units Subcutaneous Q8H  . nicotine  21 mg Transdermal Daily  . pantoprazole (PROTONIX) IV  40 mg Intravenous Q12H   sodium chloride, sodium chloride, acetaminophen **OR** acetaminophen, alteplase, heparin, hydrALAZINE, lidocaine (PF), lidocaine (PF), lidocaine-prilocaine, metoCLOPramide (REGLAN) injection, pentafluoroprop-tetrafluoroeth  Assessment/ Plan:  1. Acute kidney injury on CKD IV/V due to severe dehydration caused by intractable nausea, vomiting and decreased oral intake.  She is at high risk of worsening renal failure.  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  Looks dry on physical exam.   Renal ultrasound showed no evidence of hydronephrosis.    Plans for tunneled dialysis catheter.  Next dialysis treatment will be 07/27/2020.  Appreciate assistance from of VVS and interventional radiology  2.Intractable nausea vomiting due to gastroparesis: She has a history of diabetes.  Followed by GI as outpatient.  Continue supportive care, antiemetics per primary team.  3 Anion gap metabolic acidosis due to renal failure.  Improved IV bicarbonate discontinued.  Patient is now dialysis dependent ESRD  4.Anemia of CKD, chronic illness: Iron sats adequate at 41% continues on darbepoetin 150 mcg weekly  5.CKD MBD,  hypocalcemia.  PTH 81.  Hyperphosphatemia.  Phosphate binders calcium  acetate 3 with meals.  6.Hypertension: Continues with elevated blood pressures.  We will add amlodipine 10 mg daily.  Should improve with ultrafiltration.  7.  Hypokalemia noted.  We will dialyze on added potassium bath 4K adjustments made to his dialysate    LOS: Chloride @TODAY @7 :04 AM

## 2020-07-27 NOTE — Anesthesia Postprocedure Evaluation (Signed)
Anesthesia Post Note  Patient: Tina Gomez  Procedure(s) Performed: INSERTION OF RIGHT INTERNAL JUGULAR TUNNELED DIALYSIS CATHETER (Right ) LEFT BRACHIO-BASILIC ARTERIOVENOUS (AV) FISTULA CREATION (Left )     Patient location during evaluation: PACU Anesthesia Type: General Level of consciousness: awake and alert Pain management: pain level controlled Vital Signs Assessment: post-procedure vital signs reviewed and stable Respiratory status: spontaneous breathing, nonlabored ventilation and respiratory function stable Cardiovascular status: blood pressure returned to baseline and stable Postop Assessment: no apparent nausea or vomiting Anesthetic complications: no   No complications documented.  Last Vitals:  Vitals:   07/27/20 1317 07/27/20 1332  BP: (!) 170/64 (!) 154/69  Pulse: 63 (!) 57  Resp: 20 15  Temp:  (!) 36.2 C  SpO2: 97% 96%    Last Pain:  Vitals:   07/27/20 1332  TempSrc:   PainSc: [place] Suriano

## 2020-07-27 NOTE — Progress Notes (Signed)
PROGRESS NOTE    Tina Gomez  GGY:[id number] DOB: Aug 08, 1956 DOA: 07/23/2020 PCP: Charlott Rakes, MD   Brief Narrative:   Tina Gomez is an 64 y.o. 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  History is per patient and her daughter who is at bedside.   Patient states she did relatively wel after discharge on 06/01/2020 for about a month and subsequently started having intermittent nausea couple of days a week.  Over the past week patient has had marked increase in her nausea and vomiting and has not had a FULL meal for about a week.  She also notes that even trying to drink water has been difficult due to vomiting.  She notes she has been thirsty for the last couple of days.  Over the past 3 days patient has had almost continuous nausea and vomiting unable to be controlled with her usual antinausea medications.  Patient denies any fevers or chills.  No abdominal pain.  No blood in the vomitus.  No melena or hematochezia.  No diarrhea.  Patient admits that she has very poor exercise tolerance and mostly is in bed but this is not anything new.  Denies any new shortness of breath or chest pain.  Patient specifically denies any orthopnea or PND.  She also specifically denies any history of CHF or pulmonary edema.  Denies CAD or MI in past.  Interim history Admitted with acute kidney injury, nephrology consulted and following. IR consulted for HD catheter placement as nephrology wants to start HD.  Subjective:   Priscille Loveless still reports some nausea, vomiting, and very poor appetite, she does report generalized weakness and fatigue as well   Assessment & Plan   Acute kidney injury on chronic kidney disease, stage IV-V -Likely secondary to severe dehydration from intractable nausea and  vomiting as well as poor oral intake, with underlining of CKD due to diabetic nephropathy. -Management per renal, he is started on hemodialysis, most recent this morning. -Interventional radiology consulted, status post right IJ temp HD cath -Renal ultrasound showed no hydronephrosis -seen by vascular for permanent access.  Anion gap metabolic acidosis -Bicarb has normalized, she is off bicarb drip currently  Intractable nausea and vomiting due to gastroparesis -Remain significant problem, which consider adding erythromycin to Reglan, but will check QTC first.  Anemia of chronic disease -Epogen per renal  Hypokalemia -Repleted, remains significant, to be addressed with hemodialysis.    Hypocalcemia -Continue to replace and monitor, renal are following.  Hyperphosphatemia -Management per renal, started on binders  Diabetes mellitus, type II -Appears to be diet controlled -Continue to monitor CBGs, if elevated, will start on insulin sliding scale  Essential hypertension -Continue Coreg  GERD -Continue PPI  Anxiety/depression -Continue duloxetine  History of CVA -Continue aspirin, statin  COPD -Currently stable and compensated -Continue bronchodilators  Tobacco abuse -Cessation discussed, continue nicotine patch  ?  Superficial thrombophlebitis of the lower extremity -Lower extremity Doppler negative for DVT  DVT Prophylaxis  heparin  Code Status: Full  Family Communication: None at bedside  Disposition Plan:  Status is: Inpatient  Remains inpatient appropriate because:Persistent severe electrolyte disturbances, IV treatments appropriate due to intensity of illness or inability to take PO and Inpatient level of care appropriate due to severity of illness   Dispo: The patient is from: Home  Anticipated d/c is to: Home              Anticipated d/c date is: > 3 days              Patient currently is not medically stable to  d/c.  Consultants Nephrology Interventional radiology Vascular surgery  Procedures  Renal US LE doppler Right IJ temp HD cath placed  Antibiotics   Anti-infectives (From admission, onward)   Start     Dose/Rate Route Frequency Ordered Stop   07/28/20 0000  ceFAZolin (ANCEF) IVPB 2g/100 mL premix        2 g 200 mL/hr over 30 Minutes Intravenous On call 07/27/20 1034 07/27/20 1111   07/27/20 1100  ceFAZolin (ANCEF) IVPB 2g/100 mL premix  Status:  Discontinued        2 g 200 mL/hr over 30 Minutes Intravenous  Once 07/27/20 1048 07/27/20 1102   07/27/20 1010  ceFAZolin (ANCEF) 2-4 GM/100ML-% IVPB       Note to Pharmacy: Cordelia Pen   : cabinet override      07/27/20 1010 07/27/20 1121        Objective:   Vitals:   07/27/20 1012 07/27/20 1302 07/27/20 1317 07/27/20 1332  BP:  (!) 170/67 (!) 170/64 (!) 154/69  Pulse: 65 61 63 (!) 57  Resp:  13 20 15   Temp:  (!) 97.3 F (36.3 C)  (!) 97.1 F (36.2 C)  TempSrc:      SpO2: 100% 97% 97% 96%  Weight:      Height:        Intake/Output Summary (Last 24 hours) at 07/27/2020 1420 Last data filed at 07/27/2020 1304 Gross per 24 hour  Intake 1000 ml  Output 210 ml  Net 790 ml   Filed Weights   07/25/20 1438 07/27/20 0710 07/27/20 0850  Weight: 79.6 kg 77.5 kg 78 kg   Exam  Awake Alert, Oriented X 3,  frail, no new F.N deficits, Normal affect Symmetrical Chest wall movement, Good air movement bilaterally, CTAB RRR,No Gallops,Rubs or new Murmurs, No Parasternal Heave +ve B.Sounds, Abd Soft, No tenderness, No rebound - guarding or rigidity. No Cyanosis, Clubbing or edema, No new Rash or bruise     Data Reviewed: I have personally reviewed following labs and imaging studies  CBC: Recent Labs  Lab 07/23/20 1110 07/24/20 0343 07/25/20 0341 07/25/20 1637 07/27/20 0033  WBC 10.3 6.8 7.2 7.1 7.1  HGB 8.3* 8.2* 8.3* 9.2* 9.0*  HCT 27.1* 26.3* 25.2* 27.5* 28.2*  MCV 90.0 88.3 84.6 85.7 87.6  PLT 320 322 303 341  798   Basic Metabolic Panel: Recent Labs  Lab 07/24/20 0418 07/24/20 0418 07/25/20 0341 07/26/20 0235 07/26/20 0608 07/26/20 1322 07/26/20 1706 07/27/20 0033  NA 144  --  140 141  --   --  140 140  K 3.6   < > 3.0* 2.5* 2.5*  --  2.9* 3.0*  CL 107  --  92* 93*  --   --  91* 92*  CO2 14*  --  24 28  --   --  28 27  GLUCOSE 66*  --  71 96  --   --  138* 92  BUN 125*  --  113* 58*  --   --  62* 61*  CREATININE 11.32*  --  10.51* 7.23*  --   --  7.10* 7.25*  CALCIUM 6.0*  --  5.6* 6.3*  --   --  7.1* 7.2*  MG  --   --  1.8  --   --  1.6*  --   --   PHOS >30.0*  --  >30.0* 8.3*  --   --  8.1* 8.3*   < > = values in this interval not displayed.   GFR: Estimated Creatinine Clearance: 7.7 mL/min (A) (by C-G formula based on SCr of 7.25 mg/dL (H)). Liver Function Tests: Recent Labs  Lab 07/23/20 0239 07/23/20 0239 07/24/20 0418 07/25/20 0341 07/26/20 0235 07/26/20 1706 07/27/20 0033  AST 8*  --   --   --   --   --   --   ALT 6  --   --   --   --   --   --   ALKPHOS 82  --   --   --   --   --   --   BILITOT 0.3  --   --   --   --   --   --   PROT 6.1*  --   --   --   --   --   --   ALBUMIN 2.0*   < > 1.7* 1.8* 1.8* 2.0* 1.9*   < > = values in this interval not displayed.   Recent Labs  Lab 07/23/20 0239  LIPASE 52*   No results for input(s): AMMONIA in the last 168 hours. Coagulation Profile: No results for input(s): INR, PROTIME in the last 168 hours. Cardiac Enzymes: Recent Labs  Lab 07/24/20 0343  CKTOTAL 47   BNP (last 3 results) No results for input(s): PROBNP in the last 8760 hours. HbA1C: No results for input(s): HGBA1C in the last 72 hours. CBG: Recent Labs  Lab 07/26/20 0424 07/26/20 1239 07/27/20 1021  GLUCAP 91 103* 90   Lipid Profile: No results for input(s): CHOL, HDL, LDLCALC, TRIG, CHOLHDL, LDLDIRECT in the last 72 hours. Thyroid Function Tests: No results for input(s): TSH, T4TOTAL, FREET4, T3FREE, THYROIDAB in the last 72 hours. Anemia  Panel: No results for input(s): VITAMINB12, FOLATE, FERRITIN, TIBC, IRON, RETICCTPCT in the last 72 hours. Urine analysis:    Component Value Date/Time   COLORURINE YELLOW 07/23/2020 0146   APPEARANCEUR CLOUDY (A) 07/23/2020 0146   LABSPEC 1.010 07/23/2020 0146   PHURINE 5.0 07/23/2020 0146   GLUCOSEU 50 (A) 07/23/2020 0146   HGBUR MODERATE (A) 07/23/2020 0146   BILIRUBINUR NEGATIVE 07/23/2020 0146   KETONESUR NEGATIVE 07/23/2020 0146   PROTEINUR >=300 (A) 07/23/2020 0146   UROBILINOGEN 1.0 10/22/2013 1626   NITRITE NEGATIVE 07/23/2020 0146   LEUKOCYTESUR NEGATIVE 07/23/2020 0146   Sepsis Labs: @LABRCNTIP (procalcitonin:4,lacticidven:4)  ) Recent Results (from the past 240 hour(s))  Respiratory Panel by RT PCR (Flu A&B, Covid) - Nasopharyngeal Swab     Status: None   Collection Time: 07/23/20  6:20 AM   Specimen: Nasopharyngeal Swab  Result Value Ref Range Status   SARS Coronavirus 2 by RT PCR NEGATIVE NEGATIVE Final    Comment: (NOTE) SARS-CoV-2 target nucleic acids are NOT DETECTED.  The SARS-CoV-2 RNA is generally detectable in upper respiratoy specimens during the acute phase of infection. The lowest concentration of SARS-CoV-2 viral copies this assay can detect is 131 copies/mL. A negative result does not preclude SARS-Cov-2 infection and should not be used as the sole basis for treatment or other patient management decisions. A negative result may occur with  improper specimen collection/handling, submission of specimen other than nasopharyngeal swab, presence of viral mutation(s) within the areas targeted by this  assay, and inadequate number of viral copies (<131 copies/mL). A negative result must be combined with clinical observations, patient history, and epidemiological information. The expected result is Negative.  Fact Sheet for Patients:  PinkCheek.be  Fact Sheet for Healthcare Providers:   GravelBags.it  This test is no t yet approved or cleared by the [place] FDA and  has been authorized for detection and/or diagnosis of SARS-CoV-2 by FDA under an Emergency Use Authorization (EUA). This EUA will remain  in effect (meaning this test can be used) for the duration of the COVID-19 declaration under Section 564(b)(1) of the Act, 21 U.S.C. section 360bbb-3(b)(1), unless the authorization is terminated or revoked sooner.     Influenza A by PCR NEGATIVE NEGATIVE Final   Influenza B by PCR NEGATIVE NEGATIVE Final    Comment: (NOTE) The Xpert Xpress SARS-CoV-2/FLU/RSV assay is intended as an aid in  the diagnosis of influenza from Nasopharyngeal swab specimens and  should not be used as a sole basis for treatment. Nasal washings and  aspirates are unacceptable for Xpert Xpress SARS-CoV-2/FLU/RSV  testing.  Fact Sheet for Patients: PinkCheek.be  Fact Sheet for Healthcare Providers: GravelBags.it  This test is not yet approved or cleared by the [place] FDA and  has been authorized for detection and/or diagnosis of SARS-CoV-2 by  FDA under an Emergency Use Authorization (EUA). This EUA will remain  in effect (meaning this test can be used) for the duration of the  Covid-19 declaration under Section 564(b)(1) of the Act, 21  U.S.C. section 360bbb-3(b)(1), unless the authorization is  terminated or revoked. Performed at [facility], [place] [address] [place], [place] [address]       Radiology Studies: DG Fluoro Guide CV Line-No Report  Result Date: 07/27/2020 Fluoroscopy was utilized by the requesting physician.  No radiographic interpretation.   VAS [place] UPPER EXT VEIN MAPPING (PRE-OP AVF)  Result Date: 07/26/2020 UPPER EXTREMITY VEIN MAPPING  Indications: Pre-access. Limitations: IV's, bandages Comparison Study: No prior studies. Performing Technologist: Oliver Hum RVT  Examination Guidelines: A complete evaluation includes B-mode imaging, spectral Doppler, color Doppler, and power Doppler as needed of all accessible portions of each vessel. Bilateral testing is considered an integral part of a complete examination. Limited examinations for reoccurring indications may be performed as noted. +-----------------+-------------+----------+---------+  Right Cephalic    Diameter (cm) Depth (cm) Findings   +-----------------+-------------+----------+---------+  Shoulder              0.33         1.04               +-----------------+-------------+----------+---------+  Prox upper arm        0.30         0.32    branching  +-----------------+-------------+----------+---------+  Mid upper arm         0.26         0.50               +-----------------+-------------+----------+---------+  Dist upper arm        0.41         0.64               +-----------------+-------------+----------+---------+  Antecubital fossa     0.37         0.54               +-----------------+-------------+----------+---------+  Prox forearm          0.33  0.40    Thrombus   +-----------------+-------------+----------+---------+  Mid forearm           0.19         0.44               +-----------------+-------------+----------+---------+  Dist forearm          0.35         0.31               +-----------------+-------------+----------+---------+ +-----------------+-------------+----------+---------+  Right Basilic     Diameter (cm) Depth (cm) Findings   +-----------------+-------------+----------+---------+  Shoulder              0.36         0.97               +-----------------+-------------+----------+---------+  Mid upper arm         0.39         1.00               +-----------------+-------------+----------+---------+  Dist upper arm        0.33         0.85    branching  +-----------------+-------------+----------+---------+  Antecubital fossa     0.21         0.77    branching   +-----------------+-------------+----------+---------+  Prox forearm          0.14         0.22    branching  +-----------------+-------------+----------+---------+  Mid forearm           0.09         0.19               +-----------------+-------------+----------+---------+  Distal forearm        0.07         0.13               +-----------------+-------------+----------+---------+ +-----------------+-------------+----------+----------------------+  Left Cephalic     Diameter (cm) Depth (cm)        Findings         +-----------------+-------------+----------+----------------------+  Shoulder              0.31         1.10                            +-----------------+-------------+----------+----------------------+  Prox upper arm        0.36         0.55          branching         +-----------------+-------------+----------+----------------------+  Mid upper arm         0.35         0.52                            +-----------------+-------------+----------+----------------------+  Dist upper arm        0.39         0.34                            +-----------------+-------------+----------+----------------------+  Antecubital fossa     0.45         0.45    branching and Thrombus  +-----------------+-------------+----------+----------------------+  Prox forearm          0.29         0.50  branching         +-----------------+-------------+----------+----------------------+  Mid forearm           0.17         0.46                            +-----------------+-------------+----------+----------------------+  Dist forearm          0.13         0.21                            +-----------------+-------------+----------+----------------------+ +-----------------+-------------+----------+---------+  Left Basilic      Diameter (cm) Depth (cm) Findings   +-----------------+-------------+----------+---------+  Shoulder              0.69         1.40                +-----------------+-------------+----------+---------+  Prox upper arm        0.33         1.40               +-----------------+-------------+----------+---------+  Mid upper arm         0.26         1.20               +-----------------+-------------+----------+---------+  Dist upper arm        0.25         0.91               +-----------------+-------------+----------+---------+  Antecubital fossa     0.15         0.65    branching  +-----------------+-------------+----------+---------+  Prox forearm          0.14         0.43    branching  +-----------------+-------------+----------+---------+  Mid forearm           0.05         0.31               +-----------------+-------------+----------+---------+  Distal forearm        0.05         0.21               +-----------------+-------------+----------+---------+ *See table(s) above for measurements and observations.  Diagnosing physician: Harold Barban MD Electronically signed by Harold Barban MD on 07/26/2020 at 8:17:17 PM.    Final      Scheduled Meds:  amLODipine  10 mg Oral Daily   aspirin  325 mg Oral Daily   atorvastatin  40 mg Oral QPM   calcium acetate  2,001 mg Oral TID WC   calcium carbonate  1 tablet Oral BID WC   carvedilol  12.5 mg Oral BID WC   chlorhexidine       Chlorhexidine Gluconate Cloth  6 each Topical Q0600   Chlorhexidine Gluconate Cloth  6 each Topical Q0600   darbepoetin (ARANESP) injection - NON-DIALYSIS  150 mcg Subcutaneous Q Mon-1800   DULoxetine  60 mg Oral Daily   heparin  5,000 Units Subcutaneous Q8H   heparin sodium (porcine)       nicotine  21 mg Transdermal Daily   ondansetron       pantoprazole (PROTONIX) IV  40 mg Intravenous Q12H   Continuous Infusions:    LOS: 4 days    Phillips Climes MD. on 07/27/2020 at 2:20 PM  Between 7am to 7pm - Please see pager noted on [email or web address]  After 7pm go to [email or web address]  And look for the night coverage person covering for me after hours  [facility]  [phone]

## 2020-07-27 NOTE — Progress Notes (Signed)
[facility] ROUNDING NOTE   Subjective:   Brief history 64 year old lady with history of diabetes hypertension gastroparesis stroke CKD stage III with nausea vomiting decreased oral intake.  She has a history of gastroparesis followed by GI.  Longstanding history of CKD with recent progression baseline serum creatinine by 3.44 mg/dL does not have a nephrologist but recently referred to [facility].  On arrival in the emergency room blood pressure was elevated BUN 138 creatinine elevated to 12.49.  She is not on ACE inhibitor or ARB at denies NSAID use.  She appeared clinically dehydrated and was placed on IV sodium bicarbonate 150 cc an hour.  Renal ultrasound did not show any evidence of hydronephrosis but diffuse increased cortical echogenicity.  Patient deemed end-stage renal disease.  For tunneled catheter and assessment for fistula placement  Subjective She underwent dialysis 07/25/2020 with 1 L ultrafiltered.  We will proceed with dialysis 07/27/2020   Blood pressure 174/72 pulse 62 temperature 97.8 O2 sats 94% room air  Sodium 140 potassium 3.0 chloride 92 CO2 27 BUN 61 creatinine 7.25 glucose 92 calcium 7.2 phosphorus 8.3 albumin 1.9 hemoglobin 9  Urine output 1.2 L 07/26/2020  Objective:  Vital signs in last 24 hours:  Temp:  [97.7 F (36.5 C)-98 F (36.7 C)] 97.8 F (36.6 C) (11/18 0612) Pulse Rate:  [62-66] 62 (11/18 0612) Resp:  [16-20] 16 (11/18 0612) BP: (164-174)/(66-74) 174/72 (11/18 0612) SpO2:  [92 %-96 %] 94 % (11/18 0612)  Weight change:  Filed Weights   07/23/20 0213 07/25/20 1220 07/25/20 1438  Weight: 85 kg 80.7 kg 79.6 kg    Intake/Output: I/O last 3 completed shifts: In: 68 [P.O.:480; I.V.:1000] Out: 2147 [Urine:1200; Other:947]   Intake/Output this shift:  Total I/O In: 200 [IV Piggyback:200] Out: 600 [Urine:600]  General exam: Ill-looking female lying on bed, dry mucous membrane. Respiratory system: Clear to  auscultation. Respiratory effort normal. No wheezing or crackle Cardiovascular system: S1 & S2 heard, RRR.  No pedal edema. Gastrointestinal system: Abdomen is nondistended, soft and nontender. Normal bowel sounds heard. Central nervous system: Alert and oriented. No focal neurological deficits. Extremities: Symmetric 5 x 5 power. Skin: No rashes, lesions or ulcers Psychiatry: Judgement and insight appear normal. Mood & affect appropriate   Basic Metabolic Panel: Recent Labs  Lab 07/24/20 0418 07/24/20 0418 07/25/20 0341 07/25/20 0341 07/26/20 0235 07/26/20 0608 07/26/20 1322 07/26/20 1706 07/27/20 0033  NA 144  --  140  --  141  --   --  140 140  K 3.6   < > 3.0*  --  2.5* 2.5*  --  2.9* 3.0*  CL 107  --  92*  --  93*  --   --  91* 92*  CO2 14*  --  24  --  28  --   --  28 27  GLUCOSE 66*  --  71  --  96  --   --  138* 92  BUN 125*  --  113*  --  58*  --   --  62* 61*  CREATININE 11.32*  --  10.51*  --  7.23*  --   --  7.10* 7.25*  CALCIUM 6.0*   < > 5.6*   < > 6.3*  --   --  7.1* 7.2*  MG  --   --  1.8  --   --   --  1.6*  --   --   PHOS >30.0*  --  >30.0*  --  8.3*  --   --  8.1* 8.3*   < > = values in this interval not displayed.    Liver Function Tests: Recent Labs  Lab 07/23/20 0239 07/23/20 0239 07/24/20 0418 07/25/20 0341 07/26/20 0235 07/26/20 1706 07/27/20 0033  AST 8*  --   --   --   --   --   --   ALT 6  --   --   --   --   --   --   ALKPHOS 82  --   --   --   --   --   --   BILITOT 0.3  --   --   --   --   --   --   PROT 6.1*  --   --   --   --   --   --   ALBUMIN 2.0*   < > 1.7* 1.8* 1.8* 2.0* 1.9*   < > = values in this interval not displayed.   Recent Labs  Lab 07/23/20 0239  LIPASE 52*   No results for input(s): AMMONIA in the last 168 hours.  CBC: Recent Labs  Lab 07/23/20 1110 07/24/20 0343 07/25/20 0341 07/25/20 1637 07/27/20 0033  WBC 10.3 6.8 7.2 7.1 7.1  HGB 8.3* 8.2* 8.3* 9.2* 9.0*  HCT 27.1* 26.3* 25.2* 27.5* 28.2*  MCV  90.0 88.3 84.6 85.7 87.6  PLT 320 322 303 341 322    Cardiac Enzymes: Recent Labs  Lab 07/24/20 0343  CKTOTAL 47    BNP: Invalid input(s): POCBNP  CBG: Recent Labs  Lab 07/26/20 0424 07/26/20 1239  GLUCAP 91 103*    Microbiology: Results for orders placed or performed during the hospital encounter of 07/23/20  Respiratory Panel by RT PCR (Flu A&B, Covid) - Nasopharyngeal Swab     Status: None   Collection Time: 07/23/20  6:20 AM   Specimen: Nasopharyngeal Swab  Result Value Ref Range Status   SARS Coronavirus 2 by RT PCR NEGATIVE NEGATIVE Final    Comment: (NOTE) SARS-CoV-2 target nucleic acids are NOT DETECTED.  The SARS-CoV-2 RNA is generally detectable in upper respiratoy specimens during the acute phase of infection. The lowest concentration of SARS-CoV-2 viral copies this assay can detect is 131 copies/mL. A negative result does not preclude SARS-Cov-2 infection and should not be used as the sole basis for treatment or other patient management decisions. A negative result may occur with  improper specimen collection/handling, submission of specimen other than nasopharyngeal swab, presence of viral mutation(s) within the areas targeted by this assay, and inadequate number of viral copies (<131 copies/mL). A negative result must be combined with clinical observations, patient history, and epidemiological information. The expected result is Negative.  Fact Sheet for Patients:  PinkCheek.be  Fact Sheet for Healthcare Providers:  GravelBags.it  This test is no t yet approved or cleared by the [place] FDA and  has been authorized for detection and/or diagnosis of SARS-CoV-2 by FDA under an Emergency Use Authorization (EUA). This EUA will remain  in effect (meaning this test can be used) for the duration of the COVID-19 declaration under Section 564(b)(1) of the Act, 21 U.S.C. section 360bbb-3(b)(1),  unless the authorization is terminated or revoked sooner.     Influenza A by PCR NEGATIVE NEGATIVE Final   Influenza B by PCR NEGATIVE NEGATIVE Final    Comment: (NOTE) The Xpert Xpress SARS-CoV-2/FLU/RSV assay is intended as an aid in  the diagnosis of influenza from Nasopharyngeal swab  specimens and  should not be used as a sole basis for treatment. Nasal washings and  aspirates are unacceptable for Xpert Xpress SARS-CoV-2/FLU/RSV  testing.  Fact Sheet for Patients: PinkCheek.be  Fact Sheet for Healthcare Providers: GravelBags.it  This test is not yet approved or cleared by the [place] FDA and  has been authorized for detection and/or diagnosis of SARS-CoV-2 by  FDA under an Emergency Use Authorization (EUA). This EUA will remain  in effect (meaning this test can be used) for the duration of the  Covid-19 declaration under Section 564(b)(1) of the Act, 21  U.S.C. section 360bbb-3(b)(1), unless the authorization is  terminated or revoked. Performed at [facility], [place] [address], [place], [place] [address]     Coagulation Studies: No results for input(s): LABPROT, INR in the last 72 hours.  Urinalysis: No results for input(s): COLORURINE, LABSPEC, PHURINE, GLUCOSEU, HGBUR, BILIRUBINUR, KETONESUR, PROTEINUR, UROBILINOGEN, NITRITE, LEUKOCYTESUR in the last 72 hours.  Invalid input(s): APPERANCEUR    Imaging: IR Fluoro Guide CV Line Right  Result Date: 07/25/2020 INDICATION: Acute kidney injury, access for dialysis EXAM: ULTRASOUND AND FLUOROSCOPIC RIGHT IJ TEMPORARY DIALYSIS CATHETER [facility] CATHETER) MEDICATIONS: 1% LIDOCAINE LOCAL ANESTHESIA/SEDATION: None. FLUOROSCOPY TIME:  Fluoroscopy Time: 0 minutes 12 seconds (1 mGy). COMPLICATIONS: None immediate. PROCEDURE: Informed written consent was obtained from the patient after a thorough discussion of the procedural risks, benefits and alternatives. All  questions were addressed. Maximal Sterile Barrier Technique was utilized including caps, mask, sterile gowns, sterile gloves, sterile drape, hand hygiene and skin antiseptic. A timeout was performed prior to the initiation of the procedure. Under sterile conditions and local anesthesia, ultrasound micropuncture access performed of right internal jugular vein. Images obtained for documentation of the patent right internal jugular vein. 018 guidewire inserted followed by 4 French micro dilator. Amplatz guidewire inserted. Measurements obtained. Tract dilatation performed to insert a 16 cm temporary Mahurkar dialysis catheter. Tip position SVC RA junction. Catheter secured with 2 0 Ethilon sutures and a sterile dressing. Blood aspirated easily followed by saline and heparin flushes. External caps applied. No immediate complication. Patient tolerated the procedure well IMPRESSION: Successful ultrasound fluoroscopic right IJ temporary dialysis catheter (16 cm Mahurkar catheter). Tip SVC RA junction. Ready for use. Electronically Signed   By: Jerilynn Mages.  Shick M.D.   On: 07/25/2020 12:09   IR US Guide Vasc Access Right  Result Date: 07/25/2020 INDICATION: Acute kidney injury, access for dialysis EXAM: ULTRASOUND AND FLUOROSCOPIC RIGHT IJ TEMPORARY DIALYSIS CATHETER [facility] CATHETER) MEDICATIONS: 1% LIDOCAINE LOCAL ANESTHESIA/SEDATION: None. FLUOROSCOPY TIME:  Fluoroscopy Time: 0 minutes 12 seconds (1 mGy). COMPLICATIONS: None immediate. PROCEDURE: Informed written consent was obtained from the patient after a thorough discussion of the procedural risks, benefits and alternatives. All questions were addressed. Maximal Sterile Barrier Technique was utilized including caps, mask, sterile gowns, sterile gloves, sterile drape, hand hygiene and skin antiseptic. A timeout was performed prior to the initiation of the procedure. Under sterile conditions and local anesthesia, ultrasound micropuncture access performed of right  internal jugular vein. Images obtained for documentation of the patent right internal jugular vein. 018 guidewire inserted followed by 4 French micro dilator. Amplatz guidewire inserted. Measurements obtained. Tract dilatation performed to insert a 16 cm temporary Mahurkar dialysis catheter. Tip position SVC RA junction. Catheter secured with 2 0 Ethilon sutures and a sterile dressing. Blood aspirated easily followed by saline and heparin flushes. External caps applied. No immediate complication. Patient tolerated the procedure well IMPRESSION: Successful ultrasound fluoroscopic right IJ temporary  dialysis catheter (16 cm Mahurkar catheter). Tip SVC RA junction. Ready for use. Electronically Signed   By: Jerilynn Mages.  Shick M.D.   On: 07/25/2020 12:09   VAS [place] UPPER EXT VEIN MAPPING (PRE-OP AVF)  Result Date: 07/26/2020 UPPER EXTREMITY VEIN MAPPING  Indications: Pre-access. Limitations: IV's, bandages Comparison Study: No prior studies. Performing Technologist: Oliver Hum RVT  Examination Guidelines: A complete evaluation includes B-mode imaging, spectral Doppler, color Doppler, and power Doppler as needed of all accessible portions of each vessel. Bilateral testing is considered an integral part of a complete examination. Limited examinations for reoccurring indications may be performed as noted. +-----------------+-------------+----------+---------+ Right Cephalic   Diameter (cm)Depth (cm)Findings  +-----------------+-------------+----------+---------+ Shoulder             0.33        1.04             +-----------------+-------------+----------+---------+ Prox upper arm       0.30        0.32   branching +-----------------+-------------+----------+---------+ Mid upper arm        0.26        0.50             +-----------------+-------------+----------+---------+ Dist upper arm       0.41        0.64             +-----------------+-------------+----------+---------+ Antecubital fossa     0.37        0.54             +-----------------+-------------+----------+---------+ Prox forearm         0.33        0.40   Thrombus  +-----------------+-------------+----------+---------+ Mid forearm          0.19        0.44             +-----------------+-------------+----------+---------+ Dist forearm         0.35        0.31             +-----------------+-------------+----------+---------+ +-----------------+-------------+----------+---------+ Right Basilic    Diameter (cm)Depth (cm)Findings  +-----------------+-------------+----------+---------+ Shoulder             0.36        0.97             +-----------------+-------------+----------+---------+ Mid upper arm        0.39        1.00             +-----------------+-------------+----------+---------+ Dist upper arm       0.33        0.85   branching +-----------------+-------------+----------+---------+ Antecubital fossa    0.21        0.77   branching +-----------------+-------------+----------+---------+ Prox forearm         0.14        0.22   branching +-----------------+-------------+----------+---------+ Mid forearm          0.09        0.19             +-----------------+-------------+----------+---------+ Distal forearm       0.07        0.13             +-----------------+-------------+----------+---------+ +-----------------+-------------+----------+----------------------+ Left Cephalic    Diameter (cm)Depth (cm)       Findings        +-----------------+-------------+----------+----------------------+ Shoulder             0.31  1.10                          +-----------------+-------------+----------+----------------------+ Prox upper arm       0.36        0.55         branching        +-----------------+-------------+----------+----------------------+ Mid upper arm        0.35        0.52                           +-----------------+-------------+----------+----------------------+ Dist upper arm       0.39        0.34                          +-----------------+-------------+----------+----------------------+ Antecubital fossa    0.45        0.45   branching and Thrombus +-----------------+-------------+----------+----------------------+ Prox forearm         0.29        0.50         branching        +-----------------+-------------+----------+----------------------+ Mid forearm          0.17        0.46                          +-----------------+-------------+----------+----------------------+ Dist forearm         0.13        0.21                          +-----------------+-------------+----------+----------------------+ +-----------------+-------------+----------+---------+ Left Basilic     Diameter (cm)Depth (cm)Findings  +-----------------+-------------+----------+---------+ Shoulder             0.69        1.40             +-----------------+-------------+----------+---------+ Prox upper arm       0.33        1.40             +-----------------+-------------+----------+---------+ Mid upper arm        0.26        1.20             +-----------------+-------------+----------+---------+ Dist upper arm       0.25        0.91             +-----------------+-------------+----------+---------+ Antecubital fossa    0.15        0.65   branching +-----------------+-------------+----------+---------+ Prox forearm         0.14        0.43   branching +-----------------+-------------+----------+---------+ Mid forearm          0.05        0.31             +-----------------+-------------+----------+---------+ Distal forearm       0.05        0.21             +-----------------+-------------+----------+---------+ *See table(s) above for measurements and observations.  Diagnosing physician: Harold Barban MD Electronically signed by Harold Barban MD on 07/26/2020 at  8:17:17 PM.    Final      Medications:   . sodium chloride    . sodium chloride    . potassium chloride 10 mEq (07/27/20 0636)   .  amLODipine  10 mg Oral Daily  . aspirin  325 mg Oral Daily  . atorvastatin  40 mg Oral QPM  . calcium acetate  2,001 mg Oral TID WC  . calcium carbonate  1 tablet Oral BID WC  . carvedilol  12.5 mg Oral BID WC  . Chlorhexidine Gluconate Cloth  6 each Topical Q0600  . Chlorhexidine Gluconate Cloth  6 each Topical Q0600  . darbepoetin (ARANESP) injection - NON-DIALYSIS  150 mcg Subcutaneous Q Mon-1800  . DULoxetine  60 mg Oral Daily  . heparin  5,000 Units Subcutaneous Q8H  . nicotine  21 mg Transdermal Daily  . pantoprazole (PROTONIX) IV  40 mg Intravenous Q12H   sodium chloride, sodium chloride, acetaminophen **OR** acetaminophen, alteplase, heparin, hydrALAZINE, lidocaine (PF), lidocaine (PF), lidocaine-prilocaine, metoCLOPramide (REGLAN) injection, pentafluoroprop-tetrafluoroeth  Assessment/ Plan:  1. Acute kidney injury on CKD IV/V due to severe dehydration caused by intractable nausea, vomiting and decreased oral intake.  She is at high risk of worsening renal failure.  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  Looks dry on physical exam.   Renal ultrasound showed no evidence of hydronephrosis.    Plans for tunneled dialysis catheter.  Next dialysis treatment will be 07/27/2020.  Appreciate assistance from of VVS and interventional radiology  2.Intractable nausea vomiting due to gastroparesis: She has a history of diabetes.  Followed by GI as outpatient.  Continue supportive care, antiemetics per primary team.  3 Anion gap metabolic acidosis due to renal failure.  Improved IV bicarbonate discontinued.  Patient is now dialysis dependent ESRD  4.Anemia of CKD, chronic illness: Iron sats adequate at 41% continues on darbepoetin 150 mcg weekly  5.CKD MBD,  hypocalcemia.  PTH 81.  Hyperphosphatemia.  Phosphate binders calcium acetate 3 with  meals.  6.Hypertension: Continues with elevated blood pressures.  We will add amlodipine 10 mg daily.  Should improve with ultrafiltration.  7.  Hypokalemia noted.  Replete cautiously    LOS: 4 Sherril Croon @TODAY @6 :58 AM

## 2020-07-27 NOTE — Anesthesia Preprocedure Evaluation (Addendum)
Anesthesia Evaluation  Patient identified by MRN, date of birth, ID band Patient awake    Reviewed: Allergy & Precautions, NPO status , Patient's Chart, lab work & pertinent test results  History of Anesthesia Complications Negative for: history of anesthetic complications  Airway Mallampati: II  TM Distance: >3 FB Neck ROM: Full    Dental  (+) Edentulous Upper, Edentulous Lower   Pulmonary COPD, Current Smoker and Patient abstained from smoking.,    Pulmonary exam normal        Cardiovascular hypertension, Pt. on medications and Pt. on home beta blockers Normal cardiovascular exam   '15 TTE - Mild to moderate concentric left ventricular hypertrophy. EF 60% to 65%. Grade 1 diastolic dysfunction. LA was mildly dilated.     Neuro/Psych CVA, No Residual Symptoms negative psych ROS   GI/Hepatic Neg liver ROS, GERD  Medicated and Poorly Controlled, Diabetic gastroparesis    Endo/Other  diabetes (diet controlled), Type 2 Hypokalemia Hypocalcemia Obesity   Renal/GU ESRFRenal disease     Musculoskeletal negative musculoskeletal ROS (+)   Abdominal   Peds  Hematology  (+) anemia ,   Anesthesia Other Findings Covid test negative   Reproductive/Obstetrics                            Anesthesia Physical Anesthesia Plan  ASA: IV  Anesthesia Plan: General   Post-op Pain Management:    Induction: Intravenous and Rapid sequence  PONV Risk Score and Plan: 3 and Treatment may vary due to age or medical condition, Ondansetron and Dexamethasone  Airway Management Planned: Oral ETT  Additional Equipment: None  Intra-op Plan:   Post-operative Plan: Extubation in OR  Informed Consent: I have reviewed the patients History and Physical, chart, labs and discussed the procedure including the risks, benefits and alternatives for the proposed anesthesia with the patient or authorized representative  who has indicated his/her understanding and acceptance.     Dental advisory given  Plan Discussed with: CRNA and Anesthesiologist  Anesthesia Plan Comments:        Anesthesia Quick Evaluation

## 2020-07-27 NOTE — Progress Notes (Signed)
PHARMACY NOTE:  ANTIMICROBIAL RENAL DOSAGE ADJUSTMENT  Current antimicrobial regimen includes a mismatch between antimicrobial dosage and estimated renal function.  As per policy approved by the Pharmacy & Therapeutics and Medical Executive Committees, the antimicrobial dosage will be adjusted accordingly.  Current antimicrobial dosage:  Cefazolin 1g pre-op  Indication: Surgical prophylaxis  Renal Function:  Estimated Creatinine Clearance: 7.7 mL/min (A) (by C-G formula based on SCr of 7.25 mg/dL (H)). [x]      On intermittent HD, scheduled: []      On CRRT    Antimicrobial dosage has been changed to:  Cefazolin 2g once    Thank you for allowing pharmacy to be a part of this patient's care.  Benetta Spar, PharmD, BCPS, BCCP Clinical Pharmacist  Please check AMION for all [place] phone numbers After 10:00 PM, call [place] [phone]

## 2020-07-27 NOTE — Op Note (Signed)
DATE OF SERVICE: 07/27/2020  PATIENT:  Tina Gomez  64 y.o. female  PRE-OPERATIVE DIAGNOSIS:  acute renal failure  POST-OPERATIVE DIAGNOSIS:  acute renal failure  PROCEDURE:  Procedure(s): INSERTION OF RIGHT INTERNAL JUGULAR TUNNELED DIALYSIS CATHETER (Right) LEFT BRACHIO-BASILIC ARTERIOVENOUS (AV) FISTULA CREATION (Left)  SURGEON:  Surgeon(s) and Role:    * Cherre Robins, MD - Primary  ASSISTANT: Gerri Lins, PA-C  ANESTHESIA:   general  EBL:  30 mL   BLOOD ADMINISTERED:none  DRAINS: none   LOCAL MEDICATIONS USED:  NONE  SPECIMEN:  none  DISPOSITION OF SPECIMEN:  n/a  COUNTS: confirmed correct.  TOURNIQUET:  * No tourniquets in log *  PLAN OF CARE: return to inpatient unit  PATIENT DISPOSITION:  PACU - hemodynamically stable.   Delay start of Pharmacological VTE agent (>24hrs) due to surgical blood loss or risk of bleeding: no  INDICATION FOR PROCEDURE: Tina Gomez is a 64 y.o. female with new diagnosis of ESRD in need of HD access. After careful discussion of risks, benefits, and alternatives the patient was offered left upper extremity arteriovenous fistula and tunneled dialysis catheter placement. We specifically discussed risk of steal syndrome and need for maintenance of HD access. The patient understood and wished to proceed.  DESCRIPTION OF PROCEDURE: After identification of the patient in the pre-operative holding area, the patient was transferred to the operating room. The patient was positioned supine on the operating room table. Anesthesia was induced. The left arm was prepped and draped in standard fashion. A surgical pause was performed confirming correct patient, procedure, and operative location.  To begin, the left upper extremity cephalic vein, basilic vein, brachial artery were mapped. Unfortunately, the cephalic vein was thrombosed at the antecubital fossa. The left basilic vein appeared adequate for AV fistula creation. The course of the  vessels were marked on the skin. An oblique incision was made in the medial arm just proximal to the antecubital fossa. The incision was carried down through the subcutaneous tissue until the brachial artery sheath was identified. The brachial artery was skeletonized and encircled proximally and distally with silastic vessel loops. The basilic vein was identified in the distal medial arm and skeletonized. The vein appeared adequate for AVF creation. The vein was fully mobilized in the surgical bed. The patient was heparinized with 5000u of IV heparin. The brachial artery was clamped proximally and distally. The vein was clamped centrally with a bulldog. The distal vein was clamped with a right angle and transected. The distal stump was oversewn with 2-O silk. The vein was marked and swung medially over the brachial artery. An anterior arteriotomy was made over the brachial artery with an 11 blade. The incision was extended with potts scissor. The vein was spatulated. The vein was anastomosed end-to-side with 6-O prolene with parachute technique. Immediately prior to completion the anastomosis was flushed and de-aired. The clamps were released. Hemostasis was achieved. A doppler bruit was noted in the AVF and up the medial arm. This was marked over the skin. The incision was closed using 3-O vicryl and 4-O monocryl.   Upon completion of the case instrument and sharps counts were confirmed correct. The patient was transferred to the PACU in good condition. I was present for all portions of the procedure.  [name]. Stanford Breed, MD [facility] Phone Number: [phone] 07/27/2020 12:43 PM

## 2020-07-27 NOTE — Progress Notes (Signed)
Patient access not performing well. Unable to reach and maintain a Blood flow rate of 250. Arterial pressure bottoms out with every patient breath whether running straight or reversed. MD Justin Mend aware . Floor RN aware patient became nauseous during treatment resolved prior to departure. Vitals stable.

## 2020-07-28 ENCOUNTER — Encounter (HOSPITAL_COMMUNITY): Payer: Self-pay | Admitting: Vascular Surgery

## 2020-07-28 ENCOUNTER — Encounter (HOSPITAL_COMMUNITY)
Admission: EM | Disposition: A | Discharge status: Home / Self Care | Payer: Self-pay | Source: Home / Self Care | Attending: Internal Medicine

## 2020-07-28 DIAGNOSIS — N179 Acute kidney failure, unspecified: Principal | ICD-10-CM

## 2020-07-28 DIAGNOSIS — E43 Unspecified severe protein-calorie malnutrition: Secondary | ICD-10-CM | POA: Insufficient documentation

## 2020-07-28 LAB — RENAL FUNCTION PANEL
Albumin: 1.8 g/dL — ABNORMAL LOW (ref 3.5–5.0)
Anion gap: 20 — ABNORMAL HIGH (ref 5–15)
BUN: 59 mg/dL — ABNORMAL HIGH (ref 8–23)
CO2: 26 mmol/L (ref 22–32)
Calcium: 6.4 mg/dL — CL (ref 8.9–10.3)
Chloride: 95 mmol/L — ABNORMAL LOW (ref 98–111)
Creatinine, Ser: 6.92 mg/dL — ABNORMAL HIGH (ref 0.44–1.00)
GFR, Estimated: 6 mL/min — ABNORMAL LOW (ref >60)
Glucose, Bld: 70 mg/dL (ref 70–99)
Phosphorus: 9.8 mg/dL — ABNORMAL HIGH (ref 2.5–4.6)
Potassium: 3.4 mmol/L — ABNORMAL LOW (ref 3.5–5.1)
Sodium: 141 mmol/L (ref 135–145)

## 2020-07-28 LAB — CBC
HCT: 26.9 % — ABNORMAL LOW (ref 36.0–46.0)
Hemoglobin: 8.1 g/dL — ABNORMAL LOW (ref 12.0–15.0)
MCH: 27.5 pg (ref 26.0–34.0)
MCHC: 30.1 g/dL (ref 30.0–36.0)
MCV: 91.2 fL (ref 80.0–100.0)
Platelets: 301 10*3/uL (ref 150–400)
RBC: 2.95 MIL/uL — ABNORMAL LOW (ref 3.87–5.11)
RDW: 14.4 % (ref 11.5–15.5)
WBC: 8 10*3/uL (ref 4.0–10.5)
nRBC: 0 % (ref 0.0–0.2)

## 2020-07-28 LAB — GLUCOSE, CAPILLARY
Glucose-Capillary: 105 mg/dL — ABNORMAL HIGH (ref 70–99)
Glucose-Capillary: 122 mg/dL — ABNORMAL HIGH (ref 70–99)
Glucose-Capillary: 130 mg/dL — ABNORMAL HIGH (ref 70–99)
Glucose-Capillary: 104 mg/dL — ABNORMAL HIGH (ref 70–99)

## 2020-07-28 LAB — MAGNESIUM: Magnesium: 2 mg/dL (ref 1.7–2.4)

## 2020-07-28 SURGERY — ARTERIOVENOUS (AV) FISTULA CREATION
Anesthesia: Choice | Laterality: Left

## 2020-07-28 MED ORDER — ENSURE ENLIVE PO LIQD: 237.0000 mL | ORAL | Status: DC

## 2020-07-28 MED ORDER — RENA-VITE PO TABS
1.0000 | ORAL_TABLET | Freq: Every day | ORAL | Status: DC
  Administered 2020-07-28 – 2020-07-30 (×3): 1 via ORAL
  Filled 2020-07-28 (×3): qty 1

## 2020-07-28 MED ORDER — CALCIUM GLUCONATE-NACL 1-0.675 GM/50ML-% IV SOLN
1.0000 g | Freq: Once | INTRAVENOUS | Status: AC
  Administered 2020-07-28: 1000 mg via INTRAVENOUS
  Filled 2020-07-28: qty 50

## 2020-07-28 MED ORDER — METOCLOPRAMIDE HCL 5 MG PO TABS
5.0000 mg | ORAL_TABLET | Freq: Three times a day (TID) | ORAL | Status: DC
  Administered 2020-07-28 – 2020-07-30 (×5): 5 mg via ORAL
  Filled 2020-07-28 (×6): qty 1

## 2020-07-28 MED ORDER — PANTOPRAZOLE SODIUM 40 MG PO TBEC
40.0000 mg | DELAYED_RELEASE_TABLET | Freq: Every day | ORAL | Status: DC
  Administered 2020-07-29 – 2020-07-31 (×3): 40 mg via ORAL
  Filled 2020-07-28 (×3): qty 1

## 2020-07-28 NOTE — Progress Notes (Signed)
CRITICAL VALUE ALERT  Critical Value:  CALCIUM 6.4  Date & Time Notied:  07/28/20 05:39  Provider Notified: OPYD   Orders Received/Actions taken:

## 2020-07-28 NOTE — Progress Notes (Signed)
Initial Nutrition Assessment  DOCUMENTATION CODES:   Severe malnutrition in context of chronic illness  INTERVENTION:    Ensure Enlive po daily, each supplement provides 350 kcal and 20 grams of protein  Snacks BID  Renal MVI daily   NUTRITION DIAGNOSIS:   Severe Malnutrition related to chronic illness (CKD now ESRD/gastroparesis) as evidenced by percent weight loss, energy intake < or equal to 75% for > or equal to 1 month.  GOAL:   Patient will meet greater than or equal to 90% of their needs  MONITOR:   PO intake, Supplement acceptance, Weight trends, Labs, I & O's  REASON FOR ASSESSMENT:   Consult Diet education  ASSESSMENT:   Patient with PMH significant for DM, HTN, CKD, COPD, CVA with right hemiplegia, and recent diagnosis of gastroparesis. Presents this admission with AKI on CKD IV.   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 Plan outpatient HD.   Pt has struggled with poor appetite over the last 3-4 months due to gastroparesis. States she attempted to eat chicken, apple sauce, and boiled eggs but often felt nauseous. Last meal completion charted as 100%. She does not like Glucerna or Nepro. Willing to try Ensure.   Provided Renal Pyramid to patient/family. Reviewed food groups and provided written recommended serving sizes specifically determined for patient's current nutritional status. Explained why diet restrictions are needed and provided lists of foods to limit/avoid that are high potassium, sodium, and phosphorus. Provided specific recommendations on safer alternatives of these foods. Discussed importance of protein intake at each meal and snack. Provided examples of how to maximize protein intake throughout the day. Discussed need for fluid restriction with dialysis, importance of minimizing weight gain between HD treatments, and renal-friendly beverage options.  Encouraged pt to discuss specific diet questions/concerns with RD at  HD outpatient facility.   Pt endorses a UBW of 200 lb and a recent wt loss of 66 lb. Records indicate pt weighed 200 lb on 9/23 and 170 lb this admission (significant loss for time frame).   Medications: phoslo, os-cal, aranesp, 5 mg reglan TID Labs: K 3.4 (L) Phosphorus 9.8- up from yesterday CBG 90-154  Diet Order:   Diet Order            Diet renal/carb modified with fluid restriction Diet-HS Snack? Nothing; Fluid restriction: 1200 mL Fluid; Room service appropriate? Yes; Fluid consistency: Thin  Diet effective now                 EDUCATION NEEDS:   Education needs have been addressed  Skin:  Skin Assessment: Skin Integrity Issues: Skin Integrity Issues:: Incisions Incisions: r neck  Last BM:  11/14  Height:   Ht Readings from Last 1 Encounters:  07/23/20 5\' 2"  (1.575 m)    Weight:   Wt Readings from Last 1 Encounters:  07/27/20 78 kg    BMI:  Body mass index is 31.45 kg/m.  Estimated Nutritional Needs:   Kcal:  2000-2200 kcal  Protein:  100-120 grams  Fluid:  1000 ml + UOP   Mariana Single RD, LDN Clinical Nutrition Pager listed in [place]

## 2020-07-28 NOTE — Discharge Instructions (Signed)
[facility]  [facility]  Discharge Instructions  AV Fistula or Graft Surgery for Dialysis Access  Please refer to the following instructions for your post-procedure care. Your surgeon or physician assistant will discuss any changes with you.  Activity  You may drive the day following your surgery, if you are comfortable and no longer taking prescription pain medication. Resume full activity as the soreness in your incision resolves.  Bathing/Showering  You may shower after you go home. Keep your incision dry for 48 hours. Do not soak in a bathtub, hot tub, or swim until the incision heals completely. You may not shower if you have a hemodialysis catheter.  Incision Care  Clean your incision with mild soap and water after 48 hours. Pat the area dry with a clean towel. You do not need a bandage unless otherwise instructed. Do not apply any ointments or creams to your incision. You may have skin glue on your incision. Do not peel it off. It will come off on its own in about one week. Your arm may swell a bit after surgery. To reduce swelling use pillows to elevate your arm so it is above your heart. Your doctor will tell you if you need to lightly wrap your arm with an ACE bandage.  Diet  Resume your normal diet. There are not special food restrictions following this procedure. In order to heal from your surgery, it is CRITICAL to get adequate nutrition. Your body requires vitamins, minerals, and protein. Vegetables are the best source of vitamins and minerals. Vegetables also provide the perfect balance of protein. Processed food has little nutritional value, so try to avoid this.  Medications  Resume taking all of your medications. If your incision is causing pain, you may take over-the counter pain relievers such as acetaminophen (Tylenol). If you were prescribed a stronger pain medication, please be aware these medications can cause nausea and constipation.  Prevent nausea by taking the medication with a snack or meal. Avoid constipation by drinking plenty of fluids and eating foods with high amount of fiber, such as fruits, vegetables, and grains.   Do not take Tylenol if you are taking prescription pain medications.  Follow up Your surgeon may want to see you in the office following your access surgery. If so, this will be arranged at the time of your surgery.  Please call us immediately for any of the following conditions:  . Increased pain, redness, drainage (pus) from your incision site . Fever of 101 degrees or higher . Severe or worsening pain at your incision site . Hand pain or numbness. .  Reduce your risk of vascular disease:  . Stop smoking. If you would like help, call [facility] at 1-800-QUIT-NOW 443-103-0023) or [facility] at 747-784-8981  . Manage your cholesterol . Maintain a desired weight . Control your diabetes . Keep your blood pressure down  Dialysis  It will take several weeks to several months for your new dialysis access to be ready for use. Your surgeon will determine when it is okay to use it. Your nephrologist will continue to direct your dialysis. You can continue to use your Permcath until your new access is ready for use.   07/28/2020 Tina Gomez [id number] [birth year]  Surgeon(s): Cherre Robins, MD  Procedure(s): INSERTION OF RIGHT INTERNAL JUGULAR TUNNELED DIALYSIS CATHETER LEFT BRACHIO-BASILIC ARTERIOVENOUS (AV) FISTULA CREATION   May stick graft immediately   May stick graft on designated area only:   X Do  not stick Left AV fistula for 12weeks    If you have any questions, please call the office at [phone].

## 2020-07-28 NOTE — Plan of Care (Signed)
  Problem: Education: Goal: Knowledge of disease and its progression will improve 07/28/2020 0817 by Audie Box, RN Outcome: Progressing 07/27/2020 1920 by Audie Box, RN Outcome: Progressing   Problem: Health Behavior/Discharge Planning: Goal: Ability to manage health-related needs will improve 07/28/2020 0817 by Audie Box, RN Outcome: Progressing 07/27/2020 1920 by Audie Box, RN Outcome: Progressing   Problem: Clinical Measurements: Goal: Complications related to the disease process or treatment will be avoided or minimized 07/28/2020 0817 by Audie Box, RN Outcome: Progressing 07/27/2020 1920 by Audie Box, RN Outcome: Progressing Goal: Dialysis access will remain free of complications 33/38/3291 0817 by Audie Box, RN Outcome: Progressing 07/27/2020 1920 by Audie Box, RN Outcome: Progressing   Problem: Activity: Goal: Activity intolerance will improve 07/28/2020 0817 by Audie Box, RN Outcome: Progressing 07/27/2020 1920 by Audie Box, RN Outcome: Progressing

## 2020-07-28 NOTE — Plan of Care (Signed)
  Problem: Education: Goal: Knowledge of disease and its progression will improve 07/28/2020 1215 by Audie Box, RN Outcome: Progressing 07/28/2020 0817 by Audie Box, RN Outcome: Progressing   Problem: Health Behavior/Discharge Planning: Goal: Ability to manage health-related needs will improve 07/28/2020 1215 by Audie Box, RN Outcome: Progressing 07/28/2020 0817 by Audie Box, RN Outcome: Progressing   Problem: Clinical Measurements: Goal: Complications related to the disease process or treatment will be avoided or minimized 07/28/2020 1215 by Audie Box, RN Outcome: Progressing 07/28/2020 0817 by Audie Box, RN Outcome: Progressing Goal: Dialysis access will remain free of complications [id number]/87/6811 1215 by Audie Box, RN Outcome: Progressing 07/28/2020 0817 by Audie Box, RN Outcome: Progressing   Problem: Activity: Goal: Activity intolerance will improve 07/28/2020 1215 by Audie Box, RN Outcome: Progressing 07/28/2020 0817 by Audie Box, RN Outcome: Progressing

## 2020-07-28 NOTE — Progress Notes (Signed)
[facility] ROUNDING NOTE   Subjective:   Brief history 64 year old lady with history of diabetes hypertension gastroparesis stroke CKD stage III with nausea vomiting decreased oral intake.  She has a history of gastroparesis followed by GI.  Longstanding history of CKD with recent progression baseline serum creatinine by 3.44 mg/dL does not have a nephrologist but recently referred to [facility].  On arrival in the emergency room blood pressure was elevated BUN 138 creatinine elevated to 12.49.  She is not on ACE inhibitor or ARB at denies NSAID use.  She appeared clinically dehydrated and was placed on IV sodium bicarbonate 150 cc an hour.  Renal ultrasound did not show any evidence of hydronephrosis but diffuse increased cortical echogenicity.  Patient deemed end-stage renal disease.    Subjective Underwent tunnel catheter and AV fistula placement 07/27/2020 left brachiocephalic.  She is still awaiting clip.  It appears that she needs financial clearance.   Blood pressure 148/54 pulse 65 temperature 98.4 O2 sats 98% 2 L nasal cannula  Sodium 141 potassium 3.4 chloride 95 CO2 26 BUN 59 creatinine 6.92 glucose 70 calcium 6.4 albumin 1.8 phosphorus 9.8 magnesium 2 hemoglobin 8.1 9  Urine output 900 cc  Objective:  Vital signs in last 24 hours:  Temp:  [97.1 F (36.2 C)-98.7 F (37.1 C)] 98.4 F (36.9 C) (11/19 0310) Pulse Rate:  [57-66] 64 (11/19 0310) Resp:  [10-26] 15 (11/19 0310) BP: (111-183)/(48-76) 148/54 (11/19 0310) SpO2:  [96 %-100 %] 98 % (11/19 0310) Weight:  [77.5 kg-78 kg] 78 kg (11/18 0850)  Weight change:  Filed Weights   07/25/20 1438 07/27/20 0710 07/27/20 0850  Weight: 79.6 kg 77.5 kg 78 kg    Intake/Output: I/O last 3 completed shifts: In: 1240 [P.O.:240; I.V.:700; IV Piggyback:300] Out: 6301 [SWFUX:3235; Blood:30]   Intake/Output this shift:  Total I/O In: -  Out: 150 [Urine:150]  General exam: Ill-looking female lying on  bed, dry mucous membrane. Respiratory system: Clear to auscultation. Respiratory effort normal. No wheezing or crackle Cardiovascular system: S1 & S2 heard, RRR.  No pedal edema. Gastrointestinal system: Abdomen is nondistended, soft and nontender. Normal bowel sounds heard. Central nervous system: Alert and oriented. No focal neurological deficits. Extremities: Symmetric 5 x 5 power. Skin: No rashes, lesions or ulcers Psychiatry: Judgement and insight appear normal. Mood & affect appropriate   Basic Metabolic Panel: Recent Labs  Lab 07/25/20 0341 07/25/20 0341 07/26/20 0235 07/26/20 0235 07/26/20 0608 07/26/20 1322 07/26/20 1706 07/27/20 0033 07/28/20 0159  NA 140  --  141  --   --   --  140 140 141  K 3.0*   < > 2.5*  --  2.5*  --  2.9* 3.0* 3.4*  CL 92*  --  93*  --   --   --  91* 92* 95*  CO2 24  --  28  --   --   --  28 27 26   GLUCOSE 71  --  96  --   --   --  138* 92 70  BUN 113*  --  58*  --   --   --  62* 61* 59*  CREATININE 10.51*  --  7.23*  --   --   --  7.10* 7.25* 6.92*  CALCIUM 5.6*   < > 6.3*   < >  --   --  7.1* 7.2* 6.4*  MG 1.8  --   --   --   --  1.6*  --   --  2.0  PHOS >30.0*  --  8.3*  --   --   --  8.1* 8.3* 9.8*   < > = values in this interval not displayed.    Liver Function Tests: Recent Labs  Lab 07/23/20 0239 07/24/20 0418 07/25/20 0341 07/26/20 0235 07/26/20 1706 07/27/20 0033 07/28/20 0159  AST 8*  --   --   --   --   --   --   ALT 6  --   --   --   --   --   --   ALKPHOS 82  --   --   --   --   --   --   BILITOT 0.3  --   --   --   --   --   --   PROT 6.1*  --   --   --   --   --   --   ALBUMIN 2.0*   < > 1.8* 1.8* 2.0* 1.9* 1.8*   < > = values in this interval not displayed.   Recent Labs  Lab 07/23/20 0239  LIPASE 52*   No results for input(s): AMMONIA in the last 168 hours.  CBC: Recent Labs  Lab 07/24/20 0343 07/25/20 0341 07/25/20 1637 07/27/20 0033 07/28/20 0159  WBC 6.8 7.2 7.1 7.1 8.0  HGB 8.2* 8.3* 9.2* 9.0*  8.1*  HCT 26.3* 25.2* 27.5* 28.2* 26.9*  MCV 88.3 84.6 85.7 87.6 91.2  PLT 322 303 341 322 301    Cardiac Enzymes: Recent Labs  Lab 07/24/20 0343  CKTOTAL 47    BNP: Invalid input(s): POCBNP  CBG: Recent Labs  Lab 07/26/20 0424 07/26/20 1239 07/27/20 1021 07/27/20 1707 07/27/20 2046  GLUCAP 91 103* 90 119* 154*    Microbiology: Results for orders placed or performed during the hospital encounter of 07/23/20  Respiratory Panel by RT PCR (Flu A&B, Covid) - Nasopharyngeal Swab     Status: None   Collection Time: 07/23/20  6:20 AM   Specimen: Nasopharyngeal Swab  Result Value Ref Range Status   SARS Coronavirus 2 by RT PCR NEGATIVE NEGATIVE Final    Comment: (NOTE) SARS-CoV-2 target nucleic acids are NOT DETECTED.  The SARS-CoV-2 RNA is generally detectable in upper respiratoy specimens during the acute phase of infection. The lowest concentration of SARS-CoV-2 viral copies this assay can detect is 131 copies/mL. A negative result does not preclude SARS-Cov-2 infection and should not be used as the sole basis for treatment or other patient management decisions. A negative result may occur with  improper specimen collection/handling, submission of specimen other than nasopharyngeal swab, presence of viral mutation(s) within the areas targeted by this assay, and inadequate number of viral copies (<131 copies/mL). A negative result must be combined with clinical observations, patient history, and epidemiological information. The expected result is Negative.  Fact Sheet for Patients:  PinkCheek.be  Fact Sheet for Healthcare Providers:  GravelBags.it  This test is no t yet approved or cleared by the [place] FDA and  has been authorized for detection and/or diagnosis of SARS-CoV-2 by FDA under an Emergency Use Authorization (EUA). This EUA will remain  in effect (meaning this test can be used) for the  duration of the COVID-19 declaration under Section 564(b)(1) of the Act, 21 U.S.C. section 360bbb-3(b)(1), unless the authorization is terminated or revoked sooner.     Influenza A by PCR NEGATIVE NEGATIVE Final   Influenza B by PCR NEGATIVE NEGATIVE Final    Comment: (  NOTE) The Xpert Xpress SARS-CoV-2/FLU/RSV assay is intended as an aid in  the diagnosis of influenza from Nasopharyngeal swab specimens and  should not be used as a sole basis for treatment. Nasal washings and  aspirates are unacceptable for Xpert Xpress SARS-CoV-2/FLU/RSV  testing.  Fact Sheet for Patients: PinkCheek.be  Fact Sheet for Healthcare Providers: GravelBags.it  This test is not yet approved or cleared by the [place] FDA and  has been authorized for detection and/or diagnosis of SARS-CoV-2 by  FDA under an Emergency Use Authorization (EUA). This EUA will remain  in effect (meaning this test can be used) for the duration of the  Covid-19 declaration under Section 564(b)(1) of the Act, 21  U.S.C. section 360bbb-3(b)(1), unless the authorization is  terminated or revoked. Performed at [facility], [place] [address], [place], [place] [address]     Coagulation Studies: No results for input(s): LABPROT, INR in the last 72 hours.  Urinalysis: No results for input(s): COLORURINE, LABSPEC, PHURINE, GLUCOSEU, HGBUR, BILIRUBINUR, KETONESUR, PROTEINUR, UROBILINOGEN, NITRITE, LEUKOCYTESUR in the last 72 hours.  Invalid input(s): APPERANCEUR    Imaging: DG Chest Port 1V same Day  Result Date: 07/27/2020 CLINICAL DATA:  Status post central catheter placement EXAM: PORTABLE CHEST 1 VIEW COMPARISON:  May 24, 2020 FINDINGS: Central catheter tip is in the superior vena cava. No pneumothorax. No edema or airspace opacity. There is mild scarring in each upper lobe, stable. Heart is slightly enlarged with pulmonary vascularity normal. No  adenopathy. No bone lesions IMPRESSION: Central catheter tip in superior vena cava without pneumothorax. Scarring in each upper lobe, stable. No edema or airspace opacity. Heart is slightly enlarged with pulmonary vascularity normal. Electronically Signed   By: Lowella Grip III M.D.   On: 07/27/2020 15:17   DG Fluoro Guide CV Line-No Report  Result Date: 07/27/2020 Fluoroscopy was utilized by the requesting physician.  No radiographic interpretation.   VAS [place] UPPER EXT VEIN MAPPING (PRE-OP AVF)  Result Date: 07/26/2020 UPPER EXTREMITY VEIN MAPPING  Indications: Pre-access. Limitations: IV's, bandages Comparison Study: No prior studies. Performing Technologist: Oliver Hum RVT  Examination Guidelines: A complete evaluation includes B-mode imaging, spectral Doppler, color Doppler, and power Doppler as needed of all accessible portions of each vessel. Bilateral testing is considered an integral part of a complete examination. Limited examinations for reoccurring indications may be performed as noted. +-----------------+-------------+----------+---------+ Right Cephalic   Diameter (cm)Depth (cm)Findings  +-----------------+-------------+----------+---------+ Shoulder             0.33        1.04             +-----------------+-------------+----------+---------+ Prox upper arm       0.30        0.32   branching +-----------------+-------------+----------+---------+ Mid upper arm        0.26        0.50             +-----------------+-------------+----------+---------+ Dist upper arm       0.41        0.64             +-----------------+-------------+----------+---------+ Antecubital fossa    0.37        0.54             +-----------------+-------------+----------+---------+ Prox forearm         0.33        0.40   Thrombus  +-----------------+-------------+----------+---------+ Mid forearm  0.19        0.44              +-----------------+-------------+----------+---------+ Dist forearm         0.35        0.31             +-----------------+-------------+----------+---------+ +-----------------+-------------+----------+---------+ Right Basilic    Diameter (cm)Depth (cm)Findings  +-----------------+-------------+----------+---------+ Shoulder             0.36        0.97             +-----------------+-------------+----------+---------+ Mid upper arm        0.39        1.00             +-----------------+-------------+----------+---------+ Dist upper arm       0.33        0.85   branching +-----------------+-------------+----------+---------+ Antecubital fossa    0.21        0.77   branching +-----------------+-------------+----------+---------+ Prox forearm         0.14        0.22   branching +-----------------+-------------+----------+---------+ Mid forearm          0.09        0.19             +-----------------+-------------+----------+---------+ Distal forearm       0.07        0.13             +-----------------+-------------+----------+---------+ +-----------------+-------------+----------+----------------------+ Left Cephalic    Diameter (cm)Depth (cm)       Findings        +-----------------+-------------+----------+----------------------+ Shoulder             0.31        1.10                          +-----------------+-------------+----------+----------------------+ Prox upper arm       0.36        0.55         branching        +-----------------+-------------+----------+----------------------+ Mid upper arm        0.35        0.52                          +-----------------+-------------+----------+----------------------+ Dist upper arm       0.39        0.34                          +-----------------+-------------+----------+----------------------+ Antecubital fossa    0.45        0.45   branching and Thrombus  +-----------------+-------------+----------+----------------------+ Prox forearm         0.29        0.50         branching        +-----------------+-------------+----------+----------------------+ Mid forearm          0.17        0.46                          +-----------------+-------------+----------+----------------------+ Dist forearm         0.13        0.21                          +-----------------+-------------+----------+----------------------+ +-----------------+-------------+----------+---------+  Left Basilic     Diameter (cm)Depth (cm)Findings  +-----------------+-------------+----------+---------+ Shoulder             0.69        1.40             +-----------------+-------------+----------+---------+ Prox upper arm       0.33        1.40             +-----------------+-------------+----------+---------+ Mid upper arm        0.26        1.20             +-----------------+-------------+----------+---------+ Dist upper arm       0.25        0.91             +-----------------+-------------+----------+---------+ Antecubital fossa    0.15        0.65   branching +-----------------+-------------+----------+---------+ Prox forearm         0.14        0.43   branching +-----------------+-------------+----------+---------+ Mid forearm          0.05        0.31             +-----------------+-------------+----------+---------+ Distal forearm       0.05        0.21             +-----------------+-------------+----------+---------+ *See table(s) above for measurements and observations.  Diagnosing physician: Harold Barban MD Electronically signed by Harold Barban MD on 07/26/2020 at 8:17:17 PM.    Final      Medications:   . calcium gluconate 1,000 mg (07/28/20 6433)   . amLODipine  10 mg Oral Daily  . aspirin  325 mg Oral Daily  . atorvastatin  40 mg Oral QPM  . calcium acetate  2,001 mg Oral TID WC  . calcium carbonate  1 tablet Oral BID  WC  . carvedilol  12.5 mg Oral BID WC  . Chlorhexidine Gluconate Cloth  6 each Topical Q0600  . Chlorhexidine Gluconate Cloth  6 each Topical Q0600  . darbepoetin (ARANESP) injection - NON-DIALYSIS  150 mcg Subcutaneous Q Mon-1800  . DULoxetine  60 mg Oral Daily  . heparin  5,000 Units Subcutaneous Q8H  . influenza vac split quadrivalent PF  0.5 mL Intramuscular Tomorrow-1000  . nicotine  21 mg Transdermal Daily  . pantoprazole (PROTONIX) IV  40 mg Intravenous Q12H  . pneumococcal 23 valent vaccine  0.5 mL Intramuscular Tomorrow-1000   acetaminophen **OR** acetaminophen, hydrALAZINE, lidocaine (PF), metoCLOPramide (REGLAN) injection  Assessment/ Plan:  1. Acute kidney injury on CKD IV/V due to severe dehydration caused by intractable nausea, vomiting and decreased oral intake.  She is at high risk of worsening renal failure.  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  Looks dry on physical exam.   Renal ultrasound showed no evidence of hydronephrosis.    Underwent tunneled dialysis catheter and fistula placement appreciate assistance from vein and vascular surgery.  Next dialysis treatment 07/29/2019.  Clip process in place.  Financial piece needs to be overcome prior to placement.  2.Intractable nausea vomiting due to gastroparesis: She has a history of diabetes.  Followed by GI as outpatient.  Continue supportive care, antiemetics per primary team.  3 Anion gap metabolic acidosis due to renal failure.  Improved IV bicarbonate discontinued.  Patient is now dialysis dependent ESRD  4.Anemia of CKD, chronic illness: Iron sats adequate at 41% continues on darbepoetin 150 mcg  weekly  5.CKD MBD,  hypocalcemia.  PTH 81.  Hyperphosphatemia.  Phosphate binders calcium acetate 3 with meals.  6.Hypertension: Continues with elevated blood pressures.  We will add amlodipine 10 mg daily.  Should improve with ultrafiltration.  7.  Hypokalemia noted.  Replete cautiously    LOS: 5 Sherril Croon @TODAY @6 :56 AM

## 2020-07-28 NOTE — Progress Notes (Signed)
  Progress Note    07/28/2020 7:59 AM 1 Day Post-Op  Subjective:  States that left arm is a little sore otherwise no complaints   Vitals:   07/28/20 0010 07/28/20 0310  BP: (!) 133/59 (!) 148/54  Pulse: 66 64  Resp: 14 15  Temp: 98.7 F (37.1 C) 98.4 F (36.9 C)  SpO2: 99% 98%   Physical Exam: Cardiac: regular, right IJ TDC catheter dressings clean and dry Lungs: non labored Incisions:  Left AC incision is c/d/i. Mild swelling and ecchymosis present. Soft. No hematoma. Good thrill palpable Extremities:  2+ radial pulses bilaterally. Left hand 5/5/ grip strength Neurologic: alert and oriented  CBC    Component Value Date/Time   WBC 8.0 07/28/2020 0159   RBC 2.95 (L) 07/28/2020 0159   HGB 8.1 (L) 07/28/2020 0159   HCT 26.9 (L) 07/28/2020 0159   PLT 301 07/28/2020 0159   MCV 91.2 07/28/2020 0159   MCH 27.5 07/28/2020 0159   MCHC 30.1 07/28/2020 0159   RDW 14.4 07/28/2020 0159   LYMPHSABS 1.8 06/01/2020 1038   MONOABS 0.9 06/01/2020 1038   EOSABS 0.1 06/01/2020 1038   BASOSABS 0.1 06/01/2020 1038    BMET    Component Value Date/Time   NA 141 07/28/2020 0159   NA 143 06/15/2020 1134   K 3.4 (L) 07/28/2020 0159   CL 95 (L) 07/28/2020 0159   CO2 26 07/28/2020 0159   GLUCOSE 70 07/28/2020 0159   BUN 59 (H) 07/28/2020 0159   BUN 56 (H) 06/15/2020 1134   CREATININE 6.92 (H) 07/28/2020 0159   CREATININE 1.25 (H) 11/13/2016 1018   CALCIUM 6.4 (LL) 07/28/2020 0159   CALCIUM 5.8 (LL) 07/24/2020 0343   GFRNONAA 6 (L) 07/28/2020 0159   GFRNONAA 47 (L) 11/13/2016 1018   GFRAA 13 (L) 06/15/2020 1134   GFRAA 54 (L) 11/13/2016 1018    INR    Component Value Date/Time   INR 0.92 10/22/2013 1554     Intake/Output Summary (Last 24 hours) at 07/28/2020 0759 Last data filed at 07/28/2020 0300 Gross per 24 hour  Intake 1040 ml  Output 135 ml  Net 905 ml     Assessment/Plan:  64 y.o. female is s/p left  Brachiobasilic AV fistula and insertion of right IJ TDC1  Day Post-Op. Doing well post op. Incision C/d/i.  Mild ecchymosis and swelling of left upper arm. I have encouraged her to elevate her left arm and try not to sleep on it. No steal symptoms. Good thrill. She is scheduled to have HD today. She will follow up in the office in 4-6 weeks with fistula duplex. Okay from vascular standpoint for d/c    Karoline Caldwell, PA-C [facility] [phone] 07/28/2020 7:59 AM

## 2020-07-28 NOTE — Progress Notes (Signed)
PROGRESS NOTE  Tina Gomez  DOB: Dec 28, 1955  PCP: Charlott Rakes, MD NOM:[id number]  DOA: 07/23/2020  LOS: 5 days   Chief Complaint  Patient presents with  . Abdominal Pain    Gastroparesis   Brief narrative: Tina Palermo Bowmanis an 64 y.o.femalewith PMH significant for DM 2, HTN, CKD with baseline creatinine 3-4, COPD with ongoing tobacco use, history of CVA with right hemiplegia and recent diagnosis of diabetic gastroparesis. Patient presented to the ED on 11/14 with intractable nausea and vomiting.  She was recently diagnosed with diabetic gastroparesis and follows with GI.  For about a week, she had intractable vomiting, poor oral intake, and falling urine output.  Associated with weakness, fatigue.   In the ED, blood pressure was elevated, BUN/creatinine was 138/12.49, serum bicarb level was low at 11. She was given normal saline.  Admitted to hospitalist service.  Nephrology consultation was called. She was subsequently initiated on dialysis   Subjective: Patient was seen and examined this morning. Propped up in bed. Not in distress. Taking her breakfast. Nausea improving. Chart reviewed. No fever, blood pressure in 130s overnight Labs this morning with creatinine 6.92, potassium 3.4, albumin 1.8  Assessment/Plan: Acute kidney injury on chronic kidney disease, stage IV-V -Likely secondary to severe dehydration from intractable nausea and vomiting as well as poor oral intake, with underlining of CKD due to diabetic nephropathy.  Renal ultrasound did not show any obstruction. -11/18, patient underwent tunneled catheter and AV fistula placement by vascular surgery.  She had first session of dialysis on 12/18.  Next dialysis session is planned for today. -Renal navigator to arrange outpatient dialysis chair.  Because of her lack of insurance, the process may take next 2 days.  Patient probably will remain in the hospital over the weekend.  Anion gap metabolic  acidosis -Bicarb has normalized with bicarb drip.  Intractable nausea and vomiting due to gastroparesis -Continues to have nausea and vomiting. Currently on Reglan IV as needed.  -I will start her on oral Reglan scheduled.   Prolonged QTC -QTC prolonged at 505 ms on EKG from 11/18. -Patient needs Reglan.  She is not on any other QTC prolonging medications.  Electrolytes need to be monitored closely.  Hypokalemia/hypomagnesemia -Potassium and magnesium replacement per nephrology. Recent Labs  Lab 07/25/20 0341 07/25/20 0341 07/26/20 0235 07/26/20 0608 07/26/20 1322 07/26/20 1706 07/27/20 0033 07/28/20 0159  K 3.0*   < > 2.5* 2.5*  --  2.9* 3.0* 3.4*  MG 1.8  --   --   --  1.6*  --   --  2.0   < > = values in this interval not displayed.   Hyperphosphatemia -Phosphorus level was significantly high on admission which is improving after dialysis. Recent Labs  Lab 07/25/20 0341 07/25/20 0341 07/26/20 0235 07/26/20 0608 07/26/20 1322 07/26/20 1706 07/27/20 0033 07/28/20 0159  K 3.0*   < > 2.5* 2.5*  --  2.9* 3.0* 3.4*  MG 1.8  --   --   --  1.6*  --   --  2.0  PHOS >30.0*  --  8.3*  --   --  8.1* 8.3* 9.8*   < > = values in this interval not displayed.   Hypocalcemia -IV replacement as needed Recent Labs  Lab 07/25/20 0341 07/26/20 0235 07/26/20 1706 07/27/20 0033 07/28/20 0159  CALCIUM 5.6* 6.3* 7.1* 7.2* 6.4*   Anemia of chronic disease -Epogen per renal -Hemoglobin stable between 8-9 Recent Labs    05/25/20 0245 05/25/20  1053 07/24/20 0343 07/24/20 0343 07/25/20 0341 07/25/20 0341 07/25/20 1637 07/25/20 1637 07/27/20 0033 07/28/20 0159  HGB 9.8*   < > 8.2*  --  8.3*  --  9.2*  --  9.0* 8.1*  MCV 90.3   < > 88.3   < > 84.6   < > 85.7   < > 87.6 91.2  VITAMINB12 218  --   --   --   --   --   --   --   --   --   FOLATE 9.1  --   --   --   --   --   --   --   --   --   FERRITIN 147  --  201  --   --   --   --   --   --   --   TIBC 165*  --  140*   --   --   --   --   --   --   --   IRON 22*  --  58  --   --   --   --   --   --   --   RETICCTPCT 1.7  --   --   --   --   --   --   --   --   --    < > = values in this interval not displayed.   Diabetes mellitus, type II -A1c 7.9 on 9/16  -Currently on sliding scale insulin only. -Continue Accu-Cheks. Recent Labs  Lab 07/26/20 0424 07/26/20 1239 07/27/20 1021 07/27/20 1707 07/27/20 2046  GLUCAP 91 103* 90 119* 154*   Essential hypertension -Continue Coreg, amlodipine -As needed hydralazine.  GERD -Continue PPI  Anxiety/depression -Continue duloxetine  History of CVA -Continue aspirin, statin  COPD -Currently stable and compensated -Continue bronchodilators  Tobacco abuse -Cessation discussed, continue nicotine patch  Superficial thrombophlebitis of the lower extremity -Lower extremity Doppler negative for DVT  Mobility: encourage ambulation Code Status:   Code Status: Full Code  Nutritional status: Body mass index is 31.45 kg/m.     Diet Order            Diet renal/carb modified with fluid restriction Diet-HS Snack? Nothing; Fluid restriction: 1200 mL Fluid; Room service appropriate? Yes; Fluid consistency: Thin  Diet effective now                 DVT prophylaxis: heparin injection 5,000 Units Start: 07/23/20 0915   Antimicrobials:  None Fluid: None Consultants: Nephrology Family Communication:  Not at bedside  Status is: Inpatient  Remains inpatient appropriate because -renal navigator working on to get outpatient dialysis chair arranged.   Dispo: The patient is from: Home              Anticipated d/c is to: Home              Anticipated d/c date is: Whenever outpatient dialysis chair can be arranged              Patient currently is not medically stable to d/c.       Infusions:    Scheduled Meds: . amLODipine  10 mg Oral Daily  . aspirin  325 mg Oral Daily  . atorvastatin  40 mg Oral QPM  . calcium acetate  2,001 mg  Oral TID WC  . calcium carbonate  1 tablet Oral BID WC  . carvedilol  12.5 mg Oral BID WC  .  Chlorhexidine Gluconate Cloth  6 each Topical Q0600  . Chlorhexidine Gluconate Cloth  6 each Topical Q0600  . darbepoetin (ARANESP) injection - NON-DIALYSIS  150 mcg Subcutaneous Q Mon-1800  . DULoxetine  60 mg Oral Daily  . heparin  5,000 Units Subcutaneous Q8H  . influenza vac split quadrivalent PF  0.5 mL Intramuscular Tomorrow-1000  . metoCLOPramide  5 mg Oral TID AC  . nicotine  21 mg Transdermal Daily  . pantoprazole (PROTONIX) IV  40 mg Intravenous Q12H  . pneumococcal 23 valent vaccine  0.5 mL Intramuscular Tomorrow-1000    Antimicrobials: Anti-infectives (From admission, onward)   Start     Dose/Rate Route Frequency Ordered Stop   07/28/20 0000  ceFAZolin (ANCEF) IVPB 2g/100 mL premix        2 g 200 mL/hr over 30 Minutes Intravenous On call 07/27/20 1034 07/27/20 1111   07/27/20 1100  ceFAZolin (ANCEF) IVPB 2g/100 mL premix  Status:  Discontinued        2 g 200 mL/hr over 30 Minutes Intravenous  Once 07/27/20 1048 07/27/20 1102   07/27/20 1010  ceFAZolin (ANCEF) 2-4 GM/100ML-% IVPB       Note to Pharmacy: Cordelia Pen   : cabinet override      07/27/20 1010 07/27/20 1121      PRN meds: acetaminophen **OR** acetaminophen, hydrALAZINE, lidocaine (PF)   Objective: Vitals:   07/28/20 0010 07/28/20 0310  BP: (!) 133/59 (!) 148/54  Pulse: 66 64  Resp: 14 15  Temp: 98.7 F (37.1 C) 98.4 F (36.9 C)  SpO2: 99% 98%    Intake/Output Summary (Last 24 hours) at 07/28/2020 0827 Last data filed at 07/28/2020 0300 Gross per 24 hour  Intake 1040 ml  Output 135 ml  Net 905 ml   Filed Weights   07/25/20 1438 07/27/20 0710 07/27/20 0850  Weight: 79.6 kg 77.5 kg 78 kg   Weight change:  Body mass index is 31.45 kg/m.   Physical Exam: General exam: Not in physical distress Skin: No rashes, lesions or ulcers. HEENT: Atraumatic, normocephalic, no obvious bleeding Lungs:  Clear to auscultation bilaterally CVS: Regular rate and rhythm, no murmur GI/Abd soft, nontender, nondistended, bowel sound present CNS: Alert, awake, oriented x3 Psychiatry: Mood appropriate Extremities: No pedal edema, no calf tenderness.  Left arm with fresh AV fistula  Data Review: I have personally reviewed the laboratory data and studies available.  Recent Labs  Lab 07/24/20 0343 07/25/20 0341 07/25/20 1637 07/27/20 0033 07/28/20 0159  WBC 6.8 7.2 7.1 7.1 8.0  HGB 8.2* 8.3* 9.2* 9.0* 8.1*  HCT 26.3* 25.2* 27.5* 28.2* 26.9*  MCV 88.3 84.6 85.7 87.6 91.2  PLT 322 303 341 322 301   Recent Labs  Lab 07/25/20 0341 07/25/20 0341 07/26/20 0235 07/26/20 0608 07/26/20 1322 07/26/20 1706 07/27/20 0033 07/28/20 0159  NA 140  --  141  --   --  140 140 141  K 3.0*   < > 2.5* 2.5*  --  2.9* 3.0* 3.4*  CL 92*  --  93*  --   --  91* 92* 95*  CO2 24  --  28  --   --  28 27 26   GLUCOSE 71  --  96  --   --  138* 92 70  BUN 113*  --  58*  --   --  62* 61* 59*  CREATININE 10.51*  --  7.23*  --   --  7.10* 7.25* 6.92*  CALCIUM 5.6*  --  6.3*  --   --  7.1* 7.2* 6.4*  MG 1.8  --   --   --  1.6*  --   --  2.0  PHOS >30.0*  --  8.3*  --   --  8.1* 8.3* 9.8*   < > = values in this interval not displayed.    F/u labs ordered.  Signed, Terrilee Croak, MD [facility] 07/28/2020

## 2020-07-28 NOTE — Progress Notes (Signed)
HD here today and Sunday, 07/30/20. If patient is cleared financially on Monday and medically ready to discharge, she can start in her clinic on Tuesday, 08/01/20. Renal Navigator discussed with Dr. Justin Mend and HD unit.  Renal Navigator will follow up Monday.   Alphonzo Cruise, [place] Renal Navigator [phone]

## 2020-07-29 LAB — GLUCOSE, CAPILLARY
Glucose-Capillary: 78 mg/dL (ref 70–99)
Glucose-Capillary: 92 mg/dL (ref 70–99)
Glucose-Capillary: 85 mg/dL (ref 70–99)
Glucose-Capillary: 148 mg/dL — ABNORMAL HIGH (ref 70–99)

## 2020-07-29 LAB — RENAL FUNCTION PANEL
Albumin: 1.8 g/dL — ABNORMAL LOW (ref 3.5–5.0)
Anion gap: 14 (ref 5–15)
BUN: 28 mg/dL — ABNORMAL HIGH (ref 8–23)
CO2: 25 mmol/L (ref 22–32)
Calcium: 6.9 mg/dL — ABNORMAL LOW (ref 8.9–10.3)
Chloride: 100 mmol/L (ref 98–111)
Creatinine, Ser: 3.8 mg/dL — ABNORMAL HIGH (ref 0.44–1.00)
GFR, Estimated: 13 mL/min — ABNORMAL LOW (ref >60)
Glucose, Bld: 77 mg/dL (ref 70–99)
Phosphorus: 3.8 mg/dL (ref 2.5–4.6)
Potassium: 3.2 mmol/L — ABNORMAL LOW (ref 3.5–5.1)
Sodium: 139 mmol/L (ref 135–145)

## 2020-07-29 MED ORDER — ALTEPLASE 2 MG IJ SOLR: 2.0000 mg | Freq: Once | INTRAMUSCULAR | Status: DC | PRN

## 2020-07-29 MED ORDER — HEPARIN SODIUM (PORCINE) 1000 UNIT/ML DIALYSIS: 1000.0000 [IU] | INTRAMUSCULAR | Status: DC | PRN

## 2020-07-29 MED ORDER — LIDOCAINE-PRILOCAINE 2.5-2.5 % EX CREA: 1.0000 "application " | TOPICAL_CREAM | CUTANEOUS | Status: DC | PRN

## 2020-07-29 MED ORDER — POTASSIUM CHLORIDE 20 MEQ PO PACK
20.0000 meq | PACK | Freq: Two times a day (BID) | ORAL | Status: AC
  Administered 2020-07-29 (×2): 20 meq via ORAL
  Filled 2020-07-29 (×2): qty 1

## 2020-07-29 MED ORDER — PENTAFLUOROPROP-TETRAFLUOROETH EX AERO: 1.0000 "application " | INHALATION_SPRAY | CUTANEOUS | Status: DC | PRN

## 2020-07-29 MED ORDER — CHLORHEXIDINE GLUCONATE CLOTH 2 % EX PADS: 6.0000 | MEDICATED_PAD | Freq: Every day | CUTANEOUS | Status: DC

## 2020-07-29 MED ORDER — SODIUM CHLORIDE 0.9 % IV SOLN: 100.0000 mL | INTRAVENOUS | Status: DC | PRN

## 2020-07-29 MED ORDER — HEPARIN SODIUM (PORCINE) 1000 UNIT/ML IJ SOLN
INTRAMUSCULAR | Status: AC
  Filled 2020-07-29: qty 4

## 2020-07-29 MED ORDER — LIDOCAINE HCL (PF) 1 % IJ SOLN: 5.0000 mL | INTRAMUSCULAR | Status: DC | PRN

## 2020-07-29 NOTE — Progress Notes (Signed)
PROGRESS NOTE  Tina Gomez  DOB: 15-Apr-1956  PCP: Charlott Rakes, MD GYI:[id number]  DOA: 07/23/2020  LOS: 6 days   Chief Complaint  Patient presents with  . Abdominal Pain    Gastroparesis   Brief narrative: Tina Asante Bowmanis an 64 y.o.femalewith PMH significant for DM 2, HTN, CKD with baseline creatinine 3-4, COPD with ongoing tobacco use, history of CVA with right hemiplegia and recent diagnosis of diabetic gastroparesis. Patient presented to the ED on 11/14 with intractable nausea and vomiting.  She was recently diagnosed with diabetic gastroparesis and follows with GI.  For about a week, she had intractable vomiting, poor oral intake, and falling urine output.  Associated with weakness, fatigue.   In the ED, blood pressure was elevated, BUN/creatinine was 138/12.49, serum bicarb level was low at 11. She was given normal saline.  Admitted to hospitalist service.  Nephrology consultation was called. She was subsequently initiated on dialysis   Subjective: Patient was seen and examined this morning. Propped up in bed. Not in distress. Taking her breakfast. Nausea improving. Chart reviewed. No fever, blood pressure in 130s overnight Labs this morning with creatinine 6.92, potassium 3.4, albumin 1.8  Assessment/Plan: Acute kidney injury on chronic kidney disease, stage IV-V -Likely secondary to severe dehydration from intractable nausea and vomiting as well as poor oral intake, with underlining of CKD due to diabetic nephropathy.  Renal ultrasound did not show any obstruction. -11/18, patient underwent tunneled catheter and AV fistula placement by vascular surgery.   -She has had 3 sessions of dialysis since 11/18, last session this morning.  Noted a plan from nephrology to have another session tomorrow 11/21.   -Renal navigator is arranging outpatient dialysis chair.  Anion gap metabolic acidosis -Bicarb has normalized with bicarb drip.  Intractable nausea and  vomiting due to gastroparesis -Nausea improved with the scheduled Reglan.  Prolonged QTC -QTC prolonged at 505 ms on EKG from 11/18. -Patient needs Reglan.  She is not on any other QTC prolonging medications.  Electrolytes need to be monitored closely.  Hypokalemia/hypomagnesemia -Potassium and magnesium replacement per nephrology.  Noted she has been ordered for a scheduled potassium. Recent Labs  Lab 07/25/20 0341 07/26/20 0235 07/26/20 0608 07/26/20 1322 07/26/20 1706 07/27/20 0033 07/28/20 0159 07/29/20 0639  K 3.0*   < > 2.5*  --  2.9* 3.0* 3.4* 3.2*  MG 1.8  --   --  1.6*  --   --  2.0  --    < > = values in this interval not displayed.   Hyperphosphatemia -Phosphorus level was significantly high on admission which is improving after dialysis. Recent Labs  Lab 07/25/20 0341 07/25/20 0341 07/26/20 0235 07/26/20 0235 07/26/20 0608 07/26/20 1322 07/26/20 1706 07/27/20 0033 07/28/20 0159 07/29/20 0639  K 3.0*   < > 2.5*   < > 2.5*  --  2.9* 3.0* 3.4* 3.2*  MG 1.8  --   --   --   --  1.6*  --   --  2.0  --   PHOS >30.0*   < > 8.3*  --   --   --  8.1* 8.3* 9.8* 3.8   < > = values in this interval not displayed.   Hypocalcemia -IV replacement as needed -Currently also on scheduled oral replacement. Recent Labs  Lab 07/26/20 0235 07/26/20 1706 07/27/20 0033 07/28/20 0159 07/29/20 0639  CALCIUM 6.3* 7.1* 7.2* 6.4* 6.9*   Anemia of chronic disease -Epogen per renal -Hemoglobin stable between 8-9 Recent  Labs    05/25/20 0245 05/25/20 1053 07/24/20 0343 07/24/20 0343 07/25/20 0341 07/25/20 0341 07/25/20 1637 07/25/20 1637 07/27/20 0033 07/28/20 0159  HGB 9.8*   < > 8.2*  --  8.3*  --  9.2*  --  9.0* 8.1*  MCV 90.3   < > 88.3   < > 84.6   < > 85.7   < > 87.6 91.2  VITAMINB12 218  --   --   --   --   --   --   --   --   --   FOLATE 9.1  --   --   --   --   --   --   --   --   --   FERRITIN 147  --  201  --   --   --   --   --   --   --   TIBC 165*  --   140*  --   --   --   --   --   --   --   IRON 22*  --  58  --   --   --   --   --   --   --   RETICCTPCT 1.7  --   --   --   --   --   --   --   --   --    < > = values in this interval not displayed.   Diabetes mellitus, type II -A1c 7.9 on 9/16  -Currently on sliding scale insulin only. -Continue Accu-Cheks. Recent Labs  Lab 07/28/20 1002 07/28/20 1205 07/28/20 1553 07/28/20 2210 07/29/20 0741  GLUCAP 105* 122* 130* 104* 78   Essential hypertension -Continue Coreg, amlodipine -As needed hydralazine.  GERD -Continue PPI  Anxiety/depression -Continue duloxetine  History of CVA -Continue aspirin, statin  COPD -Currently stable and compensated -Continue bronchodilators  Tobacco abuse -Cessation discussed, continue nicotine patch  Superficial thrombophlebitis of the lower extremity -Lower extremity Doppler negative for DVT  Mobility: encourage ambulation Code Status:   Code Status: Full Code  Nutritional status: Body mass index is 31.45 kg/m. Nutrition Problem: Severe Malnutrition Etiology: chronic illness (CKD now ESRD/gastroparesis) Signs/Symptoms: percent weight loss, energy intake < or equal to 75% for > or equal to 1 month Diet Order            Diet renal/carb modified with fluid restriction Diet-HS Snack? Nothing; Fluid restriction: 1200 mL Fluid; Room service appropriate? Yes; Fluid consistency: Thin  Diet effective now                 DVT prophylaxis: heparin injection 5,000 Units Start: 07/23/20 0915   Antimicrobials:  None Fluid: None Consultants: Nephrology Family Communication:  Not at bedside  Status is: Inpatient  Remains inpatient appropriate because -renal navigator working on to get outpatient dialysis chair arranged.   Dispo: The patient is from: Home              Anticipated d/c is to: Home              Anticipated d/c date is: Whenever outpatient dialysis chair can be arranged              Patient currently is not  medically stable to d/c.       Infusions:    Scheduled Meds: . amLODipine  10 mg Oral Daily  . aspirin  325 mg Oral Daily  . atorvastatin  40 mg  Oral QPM  . calcium acetate  2,001 mg Oral TID WC  . calcium carbonate  1 tablet Oral BID WC  . carvedilol  12.5 mg Oral BID WC  . Chlorhexidine Gluconate Cloth  6 each Topical Q0600  . darbepoetin (ARANESP) injection - NON-DIALYSIS  150 mcg Subcutaneous Q Mon-1800  . DULoxetine  60 mg Oral Daily  . feeding supplement  237 mL Oral Q24H  . heparin  5,000 Units Subcutaneous Q8H  . heparin sodium (porcine)      . influenza vac split quadrivalent PF  0.5 mL Intramuscular Tomorrow-1000  . metoCLOPramide  5 mg Oral TID AC  . multivitamin  1 tablet Oral QHS  . nicotine  21 mg Transdermal Daily  . pantoprazole  40 mg Oral Daily  . pneumococcal 23 valent vaccine  0.5 mL Intramuscular Tomorrow-1000  . potassium chloride  20 mEq Oral BID    Antimicrobials: Anti-infectives (From admission, onward)   Start     Dose/Rate Route Frequency Ordered Stop   07/28/20 0000  ceFAZolin (ANCEF) IVPB 2g/100 mL premix        2 g 200 mL/hr over 30 Minutes Intravenous On call 07/27/20 1034 07/27/20 1111   07/27/20 1100  ceFAZolin (ANCEF) IVPB 2g/100 mL premix  Status:  Discontinued        2 g 200 mL/hr over 30 Minutes Intravenous  Once 07/27/20 1048 07/27/20 1102   07/27/20 1010  ceFAZolin (ANCEF) 2-4 GM/100ML-% IVPB       Note to Pharmacy: Cordelia Pen   : cabinet override      07/27/20 1010 07/27/20 1121      PRN meds: acetaminophen **OR** acetaminophen, hydrALAZINE, lidocaine (PF)   Objective: Vitals:   07/29/20 0619 07/29/20 0802  BP: (!) 182/55 (!) 199/57  Pulse: 61   Resp: 20   Temp: 98.2 F (36.8 C) 98 F (36.7 C)  SpO2: 93% 92%    Intake/Output Summary (Last 24 hours) at 07/29/2020 1017 Last data filed at 07/29/2020 0412 Gross per 24 hour  Intake 755 ml  Output 2006 ml  Net -1251 ml   Filed Weights   07/25/20 1438  07/27/20 0710 07/27/20 0850  Weight: 79.6 kg 77.5 kg 78 kg   Weight change:  Body mass index is 31.45 kg/m.   Physical Exam: General exam: Not in physical distress Skin: No rashes, lesions or ulcers. HEENT: Atraumatic, normocephalic, no obvious bleeding Lungs: Clear to auscultation bilaterally CVS: Regular rate and rhythm, no murmur GI/Abd soft, nontender, nondistended, bowel sound present CNS: Alert, awake monitor x3 Psychiatry: Mood appropriate Extremities: No pedal edema, no calf tenderness.  Left arm with fresh AV fistula  Data Review: I have personally reviewed the laboratory data and studies available.  Recent Labs  Lab 07/24/20 0343 07/25/20 0341 07/25/20 1637 07/27/20 0033 07/28/20 0159  WBC 6.8 7.2 7.1 7.1 8.0  HGB 8.2* 8.3* 9.2* 9.0* 8.1*  HCT 26.3* 25.2* 27.5* 28.2* 26.9*  MCV 88.3 84.6 85.7 87.6 91.2  PLT 322 303 341 322 301   Recent Labs  Lab 07/25/20 0341 07/25/20 0341 07/26/20 0235 07/26/20 0235 07/26/20 0608 07/26/20 1322 07/26/20 1706 07/27/20 0033 07/28/20 0159 07/29/20 0639  NA 140   < > 141  --   --   --  140 140 141 139  K 3.0*   < > 2.5*   < > 2.5*  --  2.9* 3.0* 3.4* 3.2*  CL 92*   < > 93*  --   --   --  91* 92* 95* 100  CO2 24   < > 28  --   --   --  28 27 26 25   GLUCOSE 71   < > 96  --   --   --  138* 92 70 77  BUN 113*   < > 58*  --   --   --  62* 61* 59* 28*  CREATININE 10.51*   < > 7.23*  --   --   --  7.10* 7.25* 6.92* 3.80*  CALCIUM 5.6*   < > 6.3*  --   --   --  7.1* 7.2* 6.4* 6.9*  MG 1.8  --   --   --   --  1.6*  --   --  2.0  --   PHOS >30.0*   < > 8.3*  --   --   --  8.1* 8.3* 9.8* 3.8   < > = values in this interval not displayed.    F/u labs ordered.  Signed, Terrilee Croak, MD [facility] 07/29/2020

## 2020-07-29 NOTE — Progress Notes (Addendum)
[facility] ROUNDING NOTE   Subjective:   Brief history 64 year old lady with history of diabetes hypertension gastroparesis stroke CKD stage III with nausea vomiting decreased oral intake.  She has a history of gastroparesis followed by GI.  Longstanding history of CKD with recent progression baseline serum creatinine by 3.44 mg/dL does not have a nephrologist but recently referred to [facility].  On arrival in the emergency room blood pressure was elevated BUN 138 creatinine elevated to 12.49.  She is not on ACE inhibitor or ARB at denies NSAID use.  She appeared clinically dehydrated and was placed on IV sodium bicarbonate 150 cc an hour.  Renal ultrasound did not show any evidence of hydronephrosis but diffuse increased cortical echogenicity.  Patient deemed end-stage renal disease.  Last dialysis 07/29/2020 2 L removed  Subjective Underwent tunnel catheter and AV fistula placement 07/27/2020 left brachiocephalic.  She will be able to start in her dialysis clinic Tuesday, 08/01/2020 we will plan dialysis Sunday, 07/30/2020.   Blood pressure 182/55 pulse 65 temperature 98.2 O2 sats 93% 2 L nasal cannula  Sodium 139 potassium 3.2 chloride 100 CO2 25 BUN 28 creatinine 3.8 glucose 77 calcium 6.9 albumin 1.8    Objective:  Vital signs in last 24 hours:  Temp:  [97.5 F (36.4 C)-98.2 F (36.8 C)] 98.2 F (36.8 C) (11/20 0619) Pulse Rate:  [55-66] 61 (11/20 0619) Resp:  [9-20] 20 (11/20 0619) BP: (139-186)/(55-73) 182/55 (11/20 0619) SpO2:  [87 %-98 %] 93 % (11/20 0619)  Weight change:  Filed Weights   07/25/20 1438 07/27/20 0710 07/27/20 0850  Weight: 79.6 kg 77.5 kg 78 kg    Intake/Output: I/O last 3 completed shifts: In: 1305 [P.O.:1305] Out: 2156 [Urine:150; Other:2006]   Intake/Output this shift:  No intake/output data recorded.  General exam: Ill-looking female lying on bed, dry mucous membrane. Respiratory system: Clear to auscultation.  Respiratory effort normal. No wheezing or crackle Cardiovascular system: S1 & S2 heard, RRR.  No pedal edema. Gastrointestinal system: Abdomen is nondistended, soft and nontender. Normal bowel sounds heard. Central nervous system: Alert and oriented. No focal neurological deficits. Extremities: Symmetric 5 x 5 power. Skin: No rashes, lesions or ulcers Psychiatry: Judgement and insight appear normal. Mood & affect appropriate   Basic Metabolic Panel: Recent Labs  Lab 07/25/20 0341 07/25/20 0341 07/26/20 0235 07/26/20 0235 07/26/20 0608 07/26/20 1322 07/26/20 1706 07/26/20 1706 07/27/20 0033 07/28/20 0159 07/29/20 0639  NA 140   < > 141  --   --   --  140  --  140 141 139  K 3.0*   < > 2.5*   < > 2.5*  --  2.9*  --  3.0* 3.4* 3.2*  CL 92*   < > 93*  --   --   --  91*  --  92* 95* 100  CO2 24   < > 28  --   --   --  28  --  27 26 25   GLUCOSE 71   < > 96  --   --   --  138*  --  92 70 77  BUN 113*   < > 58*  --   --   --  62*  --  61* 59* 28*  CREATININE 10.51*   < > 7.23*  --   --   --  7.10*  --  7.25* 6.92* 3.80*  CALCIUM 5.6*   < > 6.3*   < >  --   --  7.1*   < > 7.2* 6.4* 6.9*  MG 1.8  --   --   --   --  1.6*  --   --   --  2.0  --   PHOS >30.0*   < > 8.3*  --   --   --  8.1*  --  8.3* 9.8* 3.8   < > = values in this interval not displayed.    Liver Function Tests: Recent Labs  Lab 07/23/20 0239 07/24/20 0418 07/26/20 0235 07/26/20 1706 07/27/20 0033 07/28/20 0159 07/29/20 0639  AST 8*  --   --   --   --   --   --   ALT 6  --   --   --   --   --   --   ALKPHOS 82  --   --   --   --   --   --   BILITOT 0.3  --   --   --   --   --   --   PROT 6.1*  --   --   --   --   --   --   ALBUMIN 2.0*   < > 1.8* 2.0* 1.9* 1.8* 1.8*   < > = values in this interval not displayed.   Recent Labs  Lab 07/23/20 0239  LIPASE 52*   No results for input(s): AMMONIA in the last 168 hours.  CBC: Recent Labs  Lab 07/24/20 0343 07/25/20 0341 07/25/20 1637 07/27/20 0033  07/28/20 0159  WBC 6.8 7.2 7.1 7.1 8.0  HGB 8.2* 8.3* 9.2* 9.0* 8.1*  HCT 26.3* 25.2* 27.5* 28.2* 26.9*  MCV 88.3 84.6 85.7 87.6 91.2  PLT 322 303 341 322 301    Cardiac Enzymes: Recent Labs  Lab 07/24/20 0343  CKTOTAL 47    BNP: Invalid input(s): POCBNP  CBG: Recent Labs  Lab 07/28/20 1002 07/28/20 1205 07/28/20 1553 07/28/20 2210 07/29/20 0741  GLUCAP 105* 122* 130* 104* 62    Microbiology: Results for orders placed or performed during the hospital encounter of 07/23/20  Respiratory Panel by RT PCR (Flu A&B, Covid) - Nasopharyngeal Swab     Status: None   Collection Time: 07/23/20  6:20 AM   Specimen: Nasopharyngeal Swab  Result Value Ref Range Status   SARS Coronavirus 2 by RT PCR NEGATIVE NEGATIVE Final    Comment: (NOTE) SARS-CoV-2 target nucleic acids are NOT DETECTED.  The SARS-CoV-2 RNA is generally detectable in upper respiratoy specimens during the acute phase of infection. The lowest concentration of SARS-CoV-2 viral copies this assay can detect is 131 copies/mL. A negative result does not preclude SARS-Cov-2 infection and should not be used as the sole basis for treatment or other patient management decisions. A negative result may occur with  improper specimen collection/handling, submission of specimen other than nasopharyngeal swab, presence of viral mutation(s) within the areas targeted by this assay, and inadequate number of viral copies (<131 copies/mL). A negative result must be combined with clinical observations, patient history, and epidemiological information. The expected result is Negative.  Fact Sheet for Patients:  PinkCheek.be  Fact Sheet for Healthcare Providers:  GravelBags.it  This test is no t yet approved or cleared by the [place] FDA and  has been authorized for detection and/or diagnosis of SARS-CoV-2 by FDA under an Emergency Use Authorization (EUA). This EUA  will remain  in effect (meaning this test can be used) for the duration of the COVID-19 declaration under  Section 564(b)(1) of the Act, 21 U.S.C. section 360bbb-3(b)(1), unless the authorization is terminated or revoked sooner.     Influenza A by PCR NEGATIVE NEGATIVE Final   Influenza B by PCR NEGATIVE NEGATIVE Final    Comment: (NOTE) The Xpert Xpress SARS-CoV-2/FLU/RSV assay is intended as an aid in  the diagnosis of influenza from Nasopharyngeal swab specimens and  should not be used as a sole basis for treatment. Nasal washings and  aspirates are unacceptable for Xpert Xpress SARS-CoV-2/FLU/RSV  testing.  Fact Sheet for Patients: PinkCheek.be  Fact Sheet for Healthcare Providers: GravelBags.it  This test is not yet approved or cleared by the [place] FDA and  has been authorized for detection and/or diagnosis of SARS-CoV-2 by  FDA under an Emergency Use Authorization (EUA). This EUA will remain  in effect (meaning this test can be used) for the duration of the  Covid-19 declaration under Section 564(b)(1) of the Act, 21  U.S.C. section 360bbb-3(b)(1), unless the authorization is  terminated or revoked. Performed at [facility], [place] [address], [place], [place] [address]     Coagulation Studies: No results for input(s): LABPROT, INR in the last 72 hours.  Urinalysis: No results for input(s): COLORURINE, LABSPEC, PHURINE, GLUCOSEU, HGBUR, BILIRUBINUR, KETONESUR, PROTEINUR, UROBILINOGEN, NITRITE, LEUKOCYTESUR in the last 72 hours.  Invalid input(s): APPERANCEUR    Imaging: DG Chest Port 1V same Day  Result Date: 07/27/2020 CLINICAL DATA:  Status post central catheter placement EXAM: PORTABLE CHEST 1 VIEW COMPARISON:  May 24, 2020 FINDINGS: Central catheter tip is in the superior vena cava. No pneumothorax. No edema or airspace opacity. There is mild scarring in each upper lobe, stable. Heart is  slightly enlarged with pulmonary vascularity normal. No adenopathy. No bone lesions IMPRESSION: Central catheter tip in superior vena cava without pneumothorax. Scarring in each upper lobe, stable. No edema or airspace opacity. Heart is slightly enlarged with pulmonary vascularity normal. Electronically Signed   By: Lowella Grip III M.D.   On: 07/27/2020 15:17   DG Fluoro Guide CV Line-No Report  Result Date: 07/27/2020 Fluoroscopy was utilized by the requesting physician.  No radiographic interpretation.     Medications:    . amLODipine  10 mg Oral Daily  . aspirin  325 mg Oral Daily  . atorvastatin  40 mg Oral QPM  . calcium acetate  2,001 mg Oral TID WC  . calcium carbonate  1 tablet Oral BID WC  . carvedilol  12.5 mg Oral BID WC  . Chlorhexidine Gluconate Cloth  6 each Topical Q0600  . Chlorhexidine Gluconate Cloth  6 each Topical Q0600  . darbepoetin (ARANESP) injection - NON-DIALYSIS  150 mcg Subcutaneous Q Mon-1800  . DULoxetine  60 mg Oral Daily  . feeding supplement  237 mL Oral Q24H  . heparin  5,000 Units Subcutaneous Q8H  . heparin sodium (porcine)      . influenza vac split quadrivalent PF  0.5 mL Intramuscular Tomorrow-1000  . metoCLOPramide  5 mg Oral TID AC  . multivitamin  1 tablet Oral QHS  . nicotine  21 mg Transdermal Daily  . pantoprazole  40 mg Oral Daily  . pneumococcal 23 valent vaccine  0.5 mL Intramuscular Tomorrow-1000   acetaminophen **OR** acetaminophen, hydrALAZINE, lidocaine (PF)  Assessment/ Plan:  1. Acute kidney injury on CKD IV/V due to severe dehydration caused by intractable nausea, vomiting and decreased oral intake.  She is at high risk of worsening renal failure.  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    Underwent tunneled dialysis catheter and fistula placement appreciate assistance from vein and vascular surgery.  Tolerated dialysis 07/29/2020 with 2 L removed.  Next dialysis treatment  07/30/2020.  She has been cleared for outpatient dialysis can be started 08/01/2020.  I am unable to tell from the notes which unit she has been accepted into.  2.Intractable nausea vomiting due to gastroparesis: She has a history of diabetes.  Followed by GI as outpatient.  Continue supportive care, antiemetics per primary team.  3 Anion gap metabolic acidosis due to renal failure.  Improved IV bicarbonate discontinued.  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  Hyperphosphatemia.  Phosphate binders calcium acetate 3 with meals.  Albumin 1.8  6.Hypertension: Continues with elevated blood pressures.  We will add amlodipine 10 mg daily.  Should improve with ultrafiltration.  7.  Hypokalemia noted.  Replete cautiously    LOS: 6 Sherril Croon @TODAY @7 :54 AM

## 2020-07-30 LAB — GLUCOSE, CAPILLARY
Glucose-Capillary: 90 mg/dL (ref 70–99)
Glucose-Capillary: 126 mg/dL — ABNORMAL HIGH (ref 70–99)
Glucose-Capillary: 154 mg/dL — ABNORMAL HIGH (ref 70–99)
Glucose-Capillary: 202 mg/dL — ABNORMAL HIGH (ref 70–99)

## 2020-07-30 LAB — RENAL FUNCTION PANEL
Albumin: 1.9 g/dL — ABNORMAL LOW (ref 3.5–5.0)
Anion gap: 11 (ref 5–15)
BUN: 36 mg/dL — ABNORMAL HIGH (ref 8–23)
CO2: 27 mmol/L (ref 22–32)
Calcium: 8 mg/dL — ABNORMAL LOW (ref 8.9–10.3)
Chloride: 101 mmol/L (ref 98–111)
Creatinine, Ser: 4.67 mg/dL — ABNORMAL HIGH (ref 0.44–1.00)
GFR, Estimated: 10 mL/min — ABNORMAL LOW (ref >60)
Glucose, Bld: 82 mg/dL (ref 70–99)
Phosphorus: 5.1 mg/dL — ABNORMAL HIGH (ref 2.5–4.6)
Potassium: 3.3 mmol/L — ABNORMAL LOW (ref 3.5–5.1)
Sodium: 139 mmol/L (ref 135–145)

## 2020-07-30 MED ORDER — HEPARIN SODIUM (PORCINE) 1000 UNIT/ML IJ SOLN
INTRAMUSCULAR | Status: AC
  Filled 2020-07-30: qty 4

## 2020-07-30 MED ORDER — METOCLOPRAMIDE HCL 5 MG PO TABS
5.0000 mg | ORAL_TABLET | Freq: Two times a day (BID) | ORAL | Status: DC
  Administered 2020-07-30 – 2020-07-31 (×2): 5 mg via ORAL
  Filled 2020-07-30 (×2): qty 1

## 2020-07-30 NOTE — Progress Notes (Signed)
PROGRESS NOTE  Tina Gomez  DOB: 22-Aug-1956  PCP: Charlott Rakes, MD WGN:[id number]  DOA: 07/23/2020  LOS: 7 days   Chief Complaint  Patient presents with  . Abdominal Pain    Gastroparesis   Brief narrative: Kourtnie Sachs Bowmanis an 64 y.o.femalewith PMH significant for DM 2, HTN, CKD with baseline creatinine 3-4, COPD with ongoing tobacco use, history of CVA with right hemiplegia and recent diagnosis of diabetic gastroparesis. Patient presented to the ED on 11/14 with intractable nausea and vomiting.  She was recently diagnosed with diabetic gastroparesis and follows with GI.  For about a week, she had intractable vomiting, poor oral intake, and falling urine output. Associated with weakness, fatigue.   In the ED, blood pressure was elevated, BUN/creatinine was 138/12.49, serum bicarb level was low at 11. She was given normal saline.  Admitted to hospitalist service. Nephrology consultation was called. She was subsequently initiated on dialysis   Subjective: Patient was seen and examined this morning. Lying down in bed.  Not in distress.  Feels better.  Able to eat better this morning a week. Wants to go home.  It seems like her outpatient dialysis chair arrangement is still in process because of finances. Blood pressure running elevated.  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  Renal ultrasound did not show any obstruction. -11/18, patient underwent tunneled catheter and AV fistula placement by vascular surgery.   -She has had 3 sessions of dialysis since 11/18.  Noted a plan of 4000 today.  -Renal navigator is arranging outpatient dialysis chair.  Accelerated hypertension -Currently on Coreg and amlodipine.  Blood pressure rising up to 190s.   -Expected to improve with dialysis  today.  Defer to nephrology for adjustment of blood pressure medicines on dialysis patient.    Anion gap metabolic acidosis -Bicarb has normalized with bicarb drip.  Intractable nausea and vomiting due to gastroparesis -Nausea improved with the scheduled Reglan.  Will reduce the frequency of Reglan to twice a day before meals.  Prolonged QTC -QTC prolonged at 505 ms on EKG from 11/18. -Patient needs Reglan.  She is not on any other QTC prolonging medications.  Electrolytes need to be monitored closely.  Hypokalemia/hypomagnesemia -Potassium and magnesium replacement per nephrology.  Noted she has been ordered for a scheduled potassium. Recent Labs  Lab 07/25/20 0341 07/26/20 0235 07/26/20 0608 07/26/20 1322 07/26/20 1706 07/27/20 0033 07/28/20 0159 07/29/20 0639 07/30/20 0145  K 3.0*   < >   < >  --  2.9* 3.0* 3.4* 3.2* 3.3*  MG 1.8  --   --  1.6*  --   --  2.0  --   --    < > = values in this interval not displayed.   Hyperphosphatemia -Phosphorus level was significantly high on admission which is improving after dialysis. Recent Labs  Lab 07/25/20 0341 07/26/20 0235 07/26/20 1322 07/26/20 1706 07/27/20 0033 07/28/20 0159 07/29/20 0639 07/30/20 0145  K 3.0*   < >  --  2.9* 3.0* 3.4* 3.2* 3.3*  MG 1.8  --  1.6*  --   --  2.0  --   --   PHOS >30.0*   < >  --  8.1* 8.3* 9.8* 3.8 5.1*   < > = values in this interval not displayed.   Hypocalcemia -IV replacement as needed -Currently  also on scheduled oral replacement. Recent Labs  Lab 07/26/20 1706 07/27/20 0033 07/28/20 0159 07/29/20 0639 07/30/20 0145  CALCIUM 7.1* 7.2* 6.4* 6.9* 8.0*   Anemia of chronic disease -Epogen per renal -Hemoglobin stable between 8-9 Recent Labs    05/25/20 0245 05/25/20 1053 07/24/20 0343 07/24/20 0343 07/25/20 0341 07/25/20 0341 07/25/20 1637 07/25/20 1637 07/27/20 0033 07/28/20 0159  HGB 9.8*   < > 8.2*  --  8.3*  --  9.2*  --  9.0* 8.1*  MCV 90.3   < > 88.3   < >  84.6   < > 85.7   < > 87.6 91.2  VITAMINB12 218  --   --   --   --   --   --   --   --   --   FOLATE 9.1  --   --   --   --   --   --   --   --   --   FERRITIN 147  --  201  --   --   --   --   --   --   --   TIBC 165*  --  140*  --   --   --   --   --   --   --   IRON 22*  --  58  --   --   --   --   --   --   --   RETICCTPCT 1.7  --   --   --   --   --   --   --   --   --    < > = values in this interval not displayed.   Diabetes mellitus, type II -A1c 7.9 on 9/16  -Currently on sliding scale insulin only. -Continue Accu-Cheks. Recent Labs  Lab 07/29/20 0741 07/29/20 1231 07/29/20 1618 07/29/20 2206 07/30/20 0810  GLUCAP 78 92 85 148* 90   GERD -Continue PPI  Anxiety/depression -Continue duloxetine  History of CVA -Continue aspirin, statin  COPD -Currently stable and compensated -Continue bronchodilators  Tobacco abuse -Cessation discussed, continue nicotine patch  Superficial thrombophlebitis of the lower extremity -Lower extremity Doppler negative for DVT  Mobility: encourage ambulation Code Status:   Code Status: Full Code  Nutritional status: Body mass index is 31.45 kg/m. Nutrition Problem: Severe Malnutrition Etiology: chronic illness (CKD now ESRD/gastroparesis) Signs/Symptoms: percent weight loss, energy intake < or equal to 75% for > or equal to 1 month Diet Order            Diet renal/carb modified with fluid restriction Diet-HS Snack? Nothing; Fluid restriction: 1200 mL Fluid; Room service appropriate? Yes; Fluid consistency: Thin  Diet effective now                 DVT prophylaxis: heparin injection 5,000 Units Start: 07/23/20 0915   Antimicrobials:  None Fluid: None Consultants: Nephrology Family Communication:  Not at bedside  Status is: Inpatient  Remains inpatient appropriate because -renal navigator working on to get outpatient dialysis chair arranged.   Dispo: The patient is from: Home              Anticipated d/c is  to: Home              Anticipated d/c date is: Whenever outpatient dialysis chair can be arranged              Patient currently is not medically stable to d/c.  Infusions:    Scheduled Meds: . amLODipine  10 mg Oral Daily  . aspirin  325 mg Oral Daily  . atorvastatin  40 mg Oral QPM  . calcium acetate  2,001 mg Oral TID WC  . calcium carbonate  1 tablet Oral BID WC  . carvedilol  12.5 mg Oral BID WC  . Chlorhexidine Gluconate Cloth  6 each Topical Q0600  . darbepoetin (ARANESP) injection - NON-DIALYSIS  150 mcg Subcutaneous Q Mon-1800  . DULoxetine  60 mg Oral Daily  . feeding supplement  237 mL Oral Q24H  . heparin  5,000 Units Subcutaneous Q8H  . metoCLOPramide  5 mg Oral BID AC  . multivitamin  1 tablet Oral QHS  . nicotine  21 mg Transdermal Daily  . pantoprazole  40 mg Oral Daily    Antimicrobials: Anti-infectives (From admission, onward)   Start     Dose/Rate Route Frequency Ordered Stop   07/28/20 0000  ceFAZolin (ANCEF) IVPB 2g/100 mL premix        2 g 200 mL/hr over 30 Minutes Intravenous On call 07/27/20 1034 07/27/20 1111   07/27/20 1100  ceFAZolin (ANCEF) IVPB 2g/100 mL premix  Status:  Discontinued        2 g 200 mL/hr over 30 Minutes Intravenous  Once 07/27/20 1048 07/27/20 1102   07/27/20 1010  ceFAZolin (ANCEF) 2-4 GM/100ML-% IVPB       Note to Pharmacy: Cordelia Pen   : cabinet override      07/27/20 1010 07/27/20 1121      PRN meds: acetaminophen **OR** acetaminophen, hydrALAZINE, lidocaine (PF)   Objective: Vitals:   07/30/20 0535 07/30/20 0736  BP: (!) 196/78 (!) 188/64  Pulse: 62   Resp: 18   Temp: 98.4 F (36.9 C) 98 F (36.7 C)  SpO2: 93% 95%   No intake or output data in the 24 hours ending 07/30/20 1209 Filed Weights   07/25/20 1438 07/27/20 0710 07/27/20 0850  Weight: 79.6 kg 77.5 kg 78 kg   Weight change:  Body mass index is 31.45 kg/m.   Physical Exam: General exam: calm, comfortable, not in physical  distress. Skin: No rashes, lesions or ulcers. HEENT: Atraumatic, normocephalic, no obvious bleeding Lungs: Clear to auscultation bilaterally CVS: Regular rate and rhythm, no murmur GI/Abd soft, nontender, nondistended, bowel sound present CNS: Alert, awake monitor x3 Psychiatry: Mood appropriate Extremities: No pedal edema, no calf tenderness.  Left arm with fresh AV fistula  Data Review: I have personally reviewed the laboratory data and studies available.  Recent Labs  Lab 07/24/20 0343 07/25/20 0341 07/25/20 1637 07/27/20 0033 07/28/20 0159  WBC 6.8 7.2 7.1 7.1 8.0  HGB 8.2* 8.3* 9.2* 9.0* 8.1*  HCT 26.3* 25.2* 27.5* 28.2* 26.9*  MCV 88.3 84.6 85.7 87.6 91.2  PLT 322 303 341 322 301   Recent Labs  Lab 07/25/20 0341 07/26/20 0235 07/26/20 1322 07/26/20 1706 07/27/20 0033 07/28/20 0159 07/29/20 0639 07/30/20 0145  NA 140   < >  --  140 140 141 139 139  K 3.0*   < >  --  2.9* 3.0* 3.4* 3.2* 3.3*  CL 92*   < >  --  91* 92* 95* 100 101  CO2 24   < >  --  28 27 26 25 27   GLUCOSE 71   < >  --  138* 92 70 77 82  BUN 113*   < >  --  62* 61* 59* 28* 36*  CREATININE  10.51*   < >  --  7.10* 7.25* 6.92* 3.80* 4.67*  CALCIUM 5.6*   < >  --  7.1* 7.2* 6.4* 6.9* 8.0*  MG 1.8  --  1.6*  --   --  2.0  --   --   PHOS >30.0*   < >  --  8.1* 8.3* 9.8* 3.8 5.1*   < > = values in this interval not displayed.    F/u labs ordered.  Signed, Terrilee Croak, MD [facility] 07/30/2020

## 2020-07-30 NOTE — Progress Notes (Signed)
[facility] ROUNDING NOTE   Subjective:   Brief history 64 year old lady with history of diabetes hypertension gastroparesis stroke CKD stage III with nausea vomiting decreased oral intake.  She has a history of gastroparesis followed by GI.  Longstanding history of CKD with recent progression baseline serum creatinine by 3.44 mg/dL does not have a nephrologist but recently referred to [facility].  On arrival in the emergency room blood pressure was elevated BUN 138 creatinine elevated to 12.49.  She is not on ACE inhibitor or ARB at denies NSAID use.  She appeared clinically dehydrated and was placed on IV sodium bicarbonate 150 cc an hour.  Renal ultrasound did not show any evidence of hydronephrosis but diffuse increased cortical echogenicity.  Patient deemed end-stage renal disease.  Last dialysis 07/29/2020 2 L removed  Subjective Underwent tunnel catheter and AV fistula placement 07/27/2020 left brachiocephalic.  She will be able to start in her dialysis clinic Tuesday, 08/01/2020 we will plan dialysis Sunday, 07/30/2020.   Blood pressure 180/64 pulse 64 temperature 98 O2 sats 95% room air  Sodium 139 potassium 3.3 chloride 101 CO2 27 BUN 36 creatinine 4.6 glucose 82 calcium 8 hemoglobin 8.1    Objective:  Vital signs in last 24 hours:  Temp:  [97.5 F (36.4 C)-98.4 F (36.9 C)] 98 F (36.7 C) (11/21 0736) Pulse Rate:  [62-67] 62 (11/21 0535) Resp:  [18-20] 18 (11/21 0535) BP: (158-196)/(64-78) 188/64 (11/21 0736) SpO2:  [93 %-96 %] 95 % (11/21 0736)  Weight change:  Filed Weights   07/25/20 1438 07/27/20 0710 07/27/20 0850  Weight: 79.6 kg 77.5 kg 78 kg    Intake/Output: I/O last 3 completed shifts: In: 200 [P.O.:200] Out: 2006 [Other:2006]   Intake/Output this shift:  No intake/output data recorded.  General exam: Ill-looking female lying on bed, dry mucous membrane. Respiratory system: Clear to auscultation. Respiratory effort normal.  No wheezing or crackle Cardiovascular system: S1 & S2 heard, RRR.  No pedal edema. Gastrointestinal system: Abdomen is nondistended, soft and nontender. Normal bowel sounds heard. Central nervous system: Alert and oriented. No focal neurological deficits. Extremities: Symmetric 5 x 5 power. Skin: No rashes, lesions or ulcers Psychiatry: Judgement and insight appear normal. Mood & affect appropriate   Basic Metabolic Panel: Recent Labs  Lab 07/25/20 0341 07/26/20 0235 07/26/20 1322 07/26/20 1706 07/26/20 1706 07/27/20 0033 07/27/20 0033 07/28/20 0159 07/29/20 0639 07/30/20 0145  NA 140   < >  --  140  --  140  --  141 139 139  K 3.0*   < >  --  2.9*  --  3.0*  --  3.4* 3.2* 3.3*  CL 92*   < >  --  91*  --  92*  --  95* 100 101  CO2 24   < >  --  28  --  27  --  26 25 27   GLUCOSE 71   < >  --  138*  --  92  --  70 77 82  BUN 113*   < >  --  62*  --  61*  --  59* 28* 36*  CREATININE 10.51*   < >  --  7.10*  --  7.25*  --  6.92* 3.80* 4.67*  CALCIUM 5.6*   < >  --  7.1*   < > 7.2*   < > 6.4* 6.9* 8.0*  MG 1.8  --  1.6*  --   --   --   --  2.0  --   --   PHOS >30.0*   < >  --  8.1*  --  8.3*  --  9.8* 3.8 5.1*   < > = values in this interval not displayed.    Liver Function Tests: Recent Labs  Lab 07/26/20 1706 07/27/20 0033 07/28/20 0159 07/29/20 0639 07/30/20 0145  ALBUMIN 2.0* 1.9* 1.8* 1.8* 1.9*   No results for input(s): LIPASE, AMYLASE in the last 168 hours. No results for input(s): AMMONIA in the last 168 hours.  CBC: Recent Labs  Lab 07/24/20 0343 07/25/20 0341 07/25/20 1637 07/27/20 0033 07/28/20 0159  WBC 6.8 7.2 7.1 7.1 8.0  HGB 8.2* 8.3* 9.2* 9.0* 8.1*  HCT 26.3* 25.2* 27.5* 28.2* 26.9*  MCV 88.3 84.6 85.7 87.6 91.2  PLT 322 303 341 322 301    Cardiac Enzymes: Recent Labs  Lab 07/24/20 0343  CKTOTAL 47    BNP: Invalid input(s): POCBNP  CBG: Recent Labs  Lab 07/29/20 0741 07/29/20 1231 07/29/20 1618 07/29/20 2206 07/30/20 0810   GLUCAP 78 92 85 148* 90    Microbiology: Results for orders placed or performed during the hospital encounter of 07/23/20  Respiratory Panel by RT PCR (Flu A&B, Covid) - Nasopharyngeal Swab     Status: None   Collection Time: 07/23/20  6:20 AM   Specimen: Nasopharyngeal Swab  Result Value Ref Range Status   SARS Coronavirus 2 by RT PCR NEGATIVE NEGATIVE Final    Comment: (NOTE) SARS-CoV-2 target nucleic acids are NOT DETECTED.  The SARS-CoV-2 RNA is generally detectable in upper respiratoy specimens during the acute phase of infection. The lowest concentration of SARS-CoV-2 viral copies this assay can detect is 131 copies/mL. A negative result does not preclude SARS-Cov-2 infection and should not be used as the sole basis for treatment or other patient management decisions. A negative result may occur with  improper specimen collection/handling, submission of specimen other than nasopharyngeal swab, presence of viral mutation(s) within the areas targeted by this assay, and inadequate number of viral copies (<131 copies/mL). A negative result must be combined with clinical observations, patient history, and epidemiological information. The expected result is Negative.  Fact Sheet for Patients:  PinkCheek.be  Fact Sheet for Healthcare Providers:  GravelBags.it  This test is no t yet approved or cleared by the [place] FDA and  has been authorized for detection and/or diagnosis of SARS-CoV-2 by FDA under an Emergency Use Authorization (EUA). This EUA will remain  in effect (meaning this test can be used) for the duration of the COVID-19 declaration under Section 564(b)(1) of the Act, 21 U.S.C. section 360bbb-3(b)(1), unless the authorization is terminated or revoked sooner.     Influenza A by PCR NEGATIVE NEGATIVE Final   Influenza B by PCR NEGATIVE NEGATIVE Final    Comment: (NOTE) The Xpert Xpress  SARS-CoV-2/FLU/RSV assay is intended as an aid in  the diagnosis of influenza from Nasopharyngeal swab specimens and  should not be used as a sole basis for treatment. Nasal washings and  aspirates are unacceptable for Xpert Xpress SARS-CoV-2/FLU/RSV  testing.  Fact Sheet for Patients: PinkCheek.be  Fact Sheet for Healthcare Providers: GravelBags.it  This test is not yet approved or cleared by the [place] FDA and  has been authorized for detection and/or diagnosis of SARS-CoV-2 by  FDA under an Emergency Use Authorization (EUA). This EUA will remain  in effect (meaning this test can be used) for the duration of the  Covid-19 declaration under Section 564(b)(1)  of the Act, 21  U.S.C. section 360bbb-3(b)(1), unless the authorization is  terminated or revoked. Performed at [facility], [place] [address] [place],  [address]     Coagulation Studies: No results for input(s): LABPROT, INR in the last 72 hours.  Urinalysis: No results for input(s): COLORURINE, LABSPEC, PHURINE, GLUCOSEU, HGBUR, BILIRUBINUR, KETONESUR, PROTEINUR, UROBILINOGEN, NITRITE, LEUKOCYTESUR in the last 72 hours.  Invalid input(s): APPERANCEUR    Imaging: No results found.   Medications:    . amLODipine  10 mg Oral Daily  . aspirin  325 mg Oral Daily  . atorvastatin  40 mg Oral QPM  . calcium acetate  2,001 mg Oral TID WC  . calcium carbonate  1 tablet Oral BID WC  . carvedilol  12.5 mg Oral BID WC  . Chlorhexidine Gluconate Cloth  6 each Topical Q0600  . darbepoetin (ARANESP) injection - NON-DIALYSIS  150 mcg Subcutaneous Q Mon-1800  . DULoxetine  60 mg Oral Daily  . feeding supplement  237 mL Oral Q24H  . heparin  5,000 Units Subcutaneous Q8H  . metoCLOPramide  5 mg Oral TID AC  . multivitamin  1 tablet Oral QHS  . nicotine  21 mg Transdermal Daily  . pantoprazole  40 mg Oral Daily   acetaminophen **OR** acetaminophen,  hydrALAZINE, lidocaine (PF)  Assessment/ Plan:  1. Acute kidney injury on CKD IV/V due to severe dehydration caused by intractable nausea, vomiting and decreased oral intake.  She is at high risk of worsening renal failure.  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    Underwent tunneled dialysis catheter and fistula placement appreciate assistance from vein and vascular surgery.  Tolerated dialysis 07/29/2020 with 2 L removed.  Next dialysis treatment 07/30/2020.  She has been cleared for outpatient dialysis can be started 08/01/2020.  I am unable to tell from the notes which unit she has been accepted into.  2.Intractable nausea vomiting due to gastroparesis: She has a history of diabetes.  Followed by GI as outpatient.  Continue supportive care, antiemetics per primary team.  3 Anion gap metabolic acidosis due to renal failure.  Improved IV bicarbonate discontinued.  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  Hyperphosphatemia.  Phosphate binders calcium acetate 3 with meals.  Albumin 1.8  6.Hypertension: Continues with elevated blood pressures.  We will add amlodipine 10 mg daily.  Should improve with ultrafiltration.  7.  Hypokalemia noted.  Replete cautiously    LOS: 7 Sherril Croon @TODAY @10 :12 AM

## 2020-07-31 ENCOUNTER — Other Ambulatory Visit (HOSPITAL_COMMUNITY): Payer: Self-pay | Admitting: Internal Medicine

## 2020-07-31 LAB — RENAL FUNCTION PANEL
Albumin: 1.9 g/dL — ABNORMAL LOW (ref 3.5–5.0)
Anion gap: 12 (ref 5–15)
BUN: 14 mg/dL (ref 8–23)
CO2: 28 mmol/L (ref 22–32)
Calcium: 8 mg/dL — ABNORMAL LOW (ref 8.9–10.3)
Chloride: 99 mmol/L (ref 98–111)
Creatinine, Ser: 2.93 mg/dL — ABNORMAL HIGH (ref 0.44–1.00)
GFR, Estimated: 17 mL/min — ABNORMAL LOW (ref >60)
Glucose, Bld: 138 mg/dL — ABNORMAL HIGH (ref 70–99)
Phosphorus: 2.2 mg/dL — ABNORMAL LOW (ref 2.5–4.6)
Potassium: 3.3 mmol/L — ABNORMAL LOW (ref 3.5–5.1)
Sodium: 139 mmol/L (ref 135–145)

## 2020-07-31 LAB — GLUCOSE, CAPILLARY
Glucose-Capillary: 126 mg/dL — ABNORMAL HIGH (ref 70–99)
Glucose-Capillary: 217 mg/dL — ABNORMAL HIGH (ref 70–99)

## 2020-07-31 MED ORDER — HYDRALAZINE HCL 25 MG PO TABS: 25.0000 mg | ORAL_TABLET | Freq: Three times a day (TID) | ORAL | 0 refills | Status: DC

## 2020-07-31 MED ORDER — AMLODIPINE BESYLATE 10 MG PO TABS: 10.0000 mg | ORAL_TABLET | Freq: Every day | ORAL | 0 refills | Status: DC

## 2020-07-31 MED ORDER — POTASSIUM CHLORIDE CRYS ER 10 MEQ PO TBCR
10.0000 meq | EXTENDED_RELEASE_TABLET | Freq: Once | ORAL | Status: AC
  Administered 2020-07-31: 10 meq via ORAL
  Filled 2020-07-31: qty 1

## 2020-07-31 MED ORDER — CALCIUM CARBONATE 1250 (500 CA) MG PO TABS: 1.0000 | ORAL_TABLET | Freq: Two times a day (BID) | ORAL | 0 refills | Status: DC

## 2020-07-31 MED ORDER — METOCLOPRAMIDE HCL 5 MG PO TABS: 5.0000 mg | ORAL_TABLET | Freq: Two times a day (BID) | ORAL | 0 refills | Status: DC

## 2020-07-31 MED ORDER — HYDRALAZINE HCL 25 MG PO TABS
25.0000 mg | ORAL_TABLET | Freq: Three times a day (TID) | ORAL | Status: DC
  Administered 2020-07-31: 25 mg via ORAL

## 2020-07-31 MED FILL — METOCLOPRAMIDE 5 MG TABLET: 5 | 30 days supply | Qty: 60 | Fill #0

## 2020-07-31 MED FILL — CALCIUM 600 MG TABS: 1500 (600 C | 30 days supply | Qty: 60 | Fill #0

## 2020-07-31 MED FILL — AMLODIPINE BESYLATE 10 MG T: 10 | 30 days supply | Qty: 30 | Fill #0

## 2020-07-31 MED FILL — hydrALAZINE HCL 25 MG TABS: 25 | 30 days supply | Qty: 90 | Fill #0

## 2020-07-31 NOTE — Progress Notes (Signed)
PROGRESS NOTE  Tina Gomez  DOB: 02-Feb-1956  PCP: Charlott Rakes, MD VVZ:[id number]  DOA: 07/23/2020  LOS: 8 days   Chief Complaint  Patient presents with  . Abdominal Pain    Gastroparesis   Brief narrative: Tina Ellenberger Bowmanis an 64 y.o.femalewith PMH significant for DM 2, HTN, CKD with baseline creatinine 3-4, COPD with ongoing tobacco use, history of CVA with right hemiplegia and recent diagnosis of diabetic gastroparesis. Patient presented to the ED on 11/14 with intractable nausea and vomiting.  She was recently diagnosed with diabetic gastroparesis and follows with GI.  For about a week, she had intractable vomiting, poor oral intake, and falling urine output. Associated with weakness, fatigue.   In the ED, blood pressure was elevated, BUN/creatinine was 138/12.49, serum bicarb level was low at 11. She was given normal saline.  Admitted to hospitalist service. Nephrology consultation was called. She was subsequently initiated on dialysis   Subjective: Patient was seen and examined this morning. Pleasant.  Propped up in bed.  Not in distress.  Wants to go home but understands he is waiting for financial clearance for outpatient dialysis. Blood pressure remains elevated.  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  Renal ultrasound did not show any obstruction. -11/18, patient underwent tunneled catheter and AV fistula placement by vascular surgery.   -She has had 4 sessions of dialysis since 11/18, last session yesterday 11/21. -Renal navigator is arranging outpatient dialysis chair.  Accelerated hypertension -Currently on Coreg and amlodipine.  Apparently she missed her a.m. dose of Coreg on 11/21.  Blood pressure remains elevated this morning.  Hydralazine added by  nephrology. -Expected to improve with dialysis today.  Defer to nephrology for adjustment of blood pressure medicines on dialysis patient.    Intractable nausea and vomiting due to gastroparesis -Nausea improved with the scheduled Reglan.  Currently on Reglan twice daily scheduled.  Continue the same.   Prolonged QTC -QTC prolonged at 505 ms on EKG from 11/18. -Patient needs Reglan.  She is not on any other QTC prolonging medications.  Electrolytes need to be monitored closely.  Hypokalemia -Potassium level continues to remain low, 3.3 this morning.  Replacement per nephrology.   Recent Labs  Lab 07/25/20 0341 07/26/20 0235 07/26/20 1322 07/26/20 1706 07/27/20 0033 07/28/20 0159 07/29/20 0639 07/30/20 0145 07/31/20 0434  K 3.0*   < >  --    < > 3.0* 3.4* 3.2* 3.3* 3.3*  MG 1.8  --  1.6*  --   --  2.0  --   --   --    < > = values in this interval not displayed.   Hypophosphatemia -Phosphorus level is low at 2.2 this morning.  She actually had hyperphosphatemia, more than 30 at presentation.  Nephrology following.  Recent Labs  Lab 07/25/20 0341 07/26/20 0235 07/26/20 1322 07/26/20 1706 07/27/20 0033 07/28/20 0159 07/29/20 0639 07/30/20 0145 07/31/20 0434  K 3.0*   < >  --    < > 3.0* 3.4* 3.2* 3.3* 3.3*  MG 1.8  --  1.6*  --   --  2.0  --   --   --   PHOS >30.0*   < >  --    < > 8.3* 9.8* 3.8 5.1* 2.2*   < > = values in this interval not displayed.  Hypocalcemia -IV replacement as needed -Currently also on scheduled oral replacement. Recent Labs  Lab 07/27/20 0033 07/28/20 0159 07/29/20 0639 07/30/20 0145 07/31/20 0434  CALCIUM 7.2* 6.4* 6.9* 8.0* 8.0*   Anemia of chronic disease -Epogen per renal -Hemoglobin stable between 8-9 Recent Labs    05/25/20 0245 05/25/20 1053 07/24/20 0343 07/24/20 0343 07/25/20 0341 07/25/20 0341 07/25/20 1637 07/25/20 1637 07/27/20 0033 07/28/20 0159  HGB 9.8*   < > 8.2*  --  8.3*  --  9.2*  --  9.0* 8.1*  MCV  90.3   < > 88.3   < > 84.6   < > 85.7   < > 87.6 91.2  VITAMINB12 218  --   --   --   --   --   --   --   --   --   FOLATE 9.1  --   --   --   --   --   --   --   --   --   FERRITIN 147  --  201  --   --   --   --   --   --   --   TIBC 165*  --  140*  --   --   --   --   --   --   --   IRON 22*  --  58  --   --   --   --   --   --   --   RETICCTPCT 1.7  --   --   --   --   --   --   --   --   --    < > = values in this interval not displayed.   Diabetes mellitus, type II -A1c 7.9 on 9/16. -Target less than 8 on high risk patient because of chronic vomiting, ESRD status. -Currently on sliding scale insulin only. -Continue Accu-Cheks. Recent Labs  Lab 07/30/20 0810 07/30/20 1208 07/30/20 1824 07/30/20 2022 07/31/20 0751  GLUCAP 90 126* 154* 202* 126*   GERD -Continue PPI  Anxiety/depression -Continue duloxetine  History of CVA -Continue aspirin, statin  COPD -Currently stable and compensated -Continue bronchodilators  Tobacco abuse -Cessation discussed, continue nicotine patch  Superficial thrombophlebitis of the lower extremity -Lower extremity Doppler negative for DVT  Mobility: encourage ambulation Code Status:   Code Status: Full Code  Nutritional status: Body mass index is 29.64 kg/m. Nutrition Problem: Severe Malnutrition Etiology: chronic illness (CKD now ESRD/gastroparesis) Signs/Symptoms: percent weight loss, energy intake < or equal to 75% for > or equal to 1 month Diet Order            Diet renal/carb modified with fluid restriction Diet-HS Snack? Nothing; Fluid restriction: 1200 mL Fluid; Room service appropriate? Yes; Fluid consistency: Thin  Diet effective now                 DVT prophylaxis: heparin injection 5,000 Units Start: 07/23/20 0915   Antimicrobials:  None Fluid: None Consultants: Nephrology Family Communication:  Not at bedside  Status is: Inpatient  Remains inpatient appropriate because -renal navigator working on to  get outpatient dialysis chair arranged.   Dispo: The patient is from: Home              Anticipated d/c is to: Home              Anticipated d/c date is: Whenever outpatient dialysis chair can be arranged  Patient currently is not medically stable to d/c.       Infusions:    Scheduled Meds: . amLODipine  10 mg Oral Daily  . aspirin  325 mg Oral Daily  . atorvastatin  40 mg Oral QPM  . calcium acetate  2,001 mg Oral TID WC  . calcium carbonate  1 tablet Oral BID WC  . carvedilol  12.5 mg Oral BID WC  . Chlorhexidine Gluconate Cloth  6 each Topical Q0600  . darbepoetin (ARANESP) injection - NON-DIALYSIS  150 mcg Subcutaneous Q Mon-1800  . DULoxetine  60 mg Oral Daily  . feeding supplement  237 mL Oral Q24H  . heparin  5,000 Units Subcutaneous Q8H  . hydrALAZINE  25 mg Oral Q8H  . metoCLOPramide  5 mg Oral BID AC  . multivitamin  1 tablet Oral QHS  . nicotine  21 mg Transdermal Daily  . pantoprazole  40 mg Oral Daily  . potassium chloride  10 mEq Oral Once    Antimicrobials: Anti-infectives (From admission, onward)   Start     Dose/Rate Route Frequency Ordered Stop   07/28/20 0000  ceFAZolin (ANCEF) IVPB 2g/100 mL premix        2 g 200 mL/hr over 30 Minutes Intravenous On call 07/27/20 1034 07/27/20 1111   07/27/20 1100  ceFAZolin (ANCEF) IVPB 2g/100 mL premix  Status:  Discontinued        2 g 200 mL/hr over 30 Minutes Intravenous  Once 07/27/20 1048 07/27/20 1102   07/27/20 1010  ceFAZolin (ANCEF) 2-4 GM/100ML-% IVPB       Note to Pharmacy: Cordelia Pen   : cabinet override      07/27/20 1010 07/27/20 1121      PRN meds: acetaminophen **OR** acetaminophen, hydrALAZINE, lidocaine (PF)   Objective: Vitals:   07/30/20 2022 07/31/20 0556  BP: (!) 150/65 (!) 190/60  Pulse: 64 64  Resp: 17 16  Temp: 98.4 F (36.9 C) 98.6 F (37 C)  SpO2: 95% 97%    Intake/Output Summary (Last 24 hours) at 07/31/2020 1005 Last data filed at 07/30/2020  1738 Gross per 24 hour  Intake --  Output 1832 ml  Net -1832 ml   Filed Weights   07/27/20 0850 07/30/20 1325 07/30/20 1738  Weight: 78 kg 75.7 kg 73.5 kg   Weight change:  Body mass index is 29.64 kg/m.   Physical Exam: General exam: Comfortable.  Not in physical distress. Skin: No rashes, lesions or ulcers. HEENT: Atraumatic, normocephalic, no obvious bleeding Lungs: Clear to auscultation bilaterally CVS: Regular rate and rhythm, no murmur GI/Abd soft, nontender, nondistended, bowel sound present CNS: Alert, awake, oriented x3 Psychiatry: Mood appropriate Extremities: No pedal edema, no calf tenderness.  Left arm with fresh AV fistula  Data Review: I have personally reviewed the laboratory data and studies available.  Recent Labs  Lab 07/25/20 0341 07/25/20 1637 07/27/20 0033 07/28/20 0159  WBC 7.2 7.1 7.1 8.0  HGB 8.3* 9.2* 9.0* 8.1*  HCT 25.2* 27.5* 28.2* 26.9*  MCV 84.6 85.7 87.6 91.2  PLT 303 341 322 301   Recent Labs  Lab 07/25/20 0341 07/26/20 0235 07/26/20 1322 07/26/20 1706 07/27/20 0033 07/28/20 0159 07/29/20 0639 07/30/20 0145 07/31/20 0434  NA 140   < >  --    < > 140 141 139 139 139  K 3.0*   < >  --    < > 3.0* 3.4* 3.2* 3.3* 3.3*  CL 92*   < >  --    < >  92* 95* 100 101 99  CO2 24   < >  --    < > 27 26 25 27 28   GLUCOSE 71   < >  --    < > 92 70 77 82 138*  BUN 113*   < >  --    < > 61* 59* 28* 36* 14  CREATININE 10.51*   < >  --    < > 7.25* 6.92* 3.80* 4.67* 2.93*  CALCIUM 5.6*   < >  --    < > 7.2* 6.4* 6.9* 8.0* 8.0*  MG 1.8  --  1.6*  --   --  2.0  --   --   --   PHOS >30.0*   < >  --    < > 8.3* 9.8* 3.8 5.1* 2.2*   < > = values in this interval not displayed.    F/u labs ordered.  Signed, Terrilee Croak, MD [facility] 07/31/2020

## 2020-07-31 NOTE — TOC Progression Note (Signed)
Transition of Care [facility]) - Progression Note    Patient Details  Name: Tina Gomez MRN: [id number] Date of Birth: 1956-01-30  [facility]) CM/SW [place], RN Phone Number: [phone] 07/31/2020, 12:56 PM  Clinical Narrative:    MATCH form completed for 30 day supply of meds.        Expected Discharge Plan and Services           Expected Discharge Date: 07/31/20                                     Social Determinants of Health (SDOH) Interventions    Readmission Risk Interventions Readmission Risk Prevention Plan 07/28/2020  Transportation Screening Complete  PCP or Specialist Appt within 3-5 Days Not Complete  Not Complete comments first available PCP is 12/16  [place] or [place] Complete  Social Work Consult for [facility] Planning/Counseling Complete  Palliative Care Screening Not Applicable  Some recent data might be hidden

## 2020-07-31 NOTE — Progress Notes (Signed)
Patient has been accepted at [facility] on a MWF scheduled with a seat time of 12:00pm. She needs to arrive to her appointments 20 minutes early.  Due to holiday scheduling this week, patient's schedule will be Tuesday and Friday. (She had HD here yesterday) She needs to arrive to the clinic tomorrow, Tuesday, 08/01/20 at 11:00am to complete intake papers prior to her first treatment. She will start her typical MWF schedule on Monday 08/07/20. Nephrologist and Attending updated. Navigator met with patient to provide information verbally and in writing. Navigator asked Renal PA to send orders to clinic and informed outpatient clinic of patient's start date.  Alphonzo Cruise, [place] Renal Navigator [phone]

## 2020-07-31 NOTE — TOC Progression Note (Signed)
Transition of Care [facility]) - Progression Note    Patient Details  Name: Tina Gomez MRN: [id number] Date of Birth: 23-May-1956  Transition of Care [facility]) CM/SW [place], [place] Phone Number: 07/31/2020, 1:28 PM  Clinical Narrative:    CSW paid $10 in petty cash for meds. Will be delivered to room when ready.        Expected Discharge Plan and Services           Expected Discharge Date: 07/31/20                                     Social Determinants of Health (SDOH) Interventions    Readmission Risk Interventions Readmission Risk Prevention Plan 07/28/2020  Transportation Screening Complete  PCP or Specialist Appt within 3-5 Days Not Complete  Not Complete comments first available PCP is 12/16  [place] or [place] Complete  Social Work Consult for [employer] Planning/Counseling Complete  Palliative Care Screening Not Applicable  Some recent data might be hidden

## 2020-07-31 NOTE — Progress Notes (Addendum)
[name] ROUNDING NOTE   Subjective:   Last HD on 11/21 with 1.8 liters UF.  Had one unmeasured void over 11/21 - no other output charted.  Note AM coreg missed on 11/21.  States she's only been able to eat today and yesterday - feels better  Review of systems:  Denies current n/v/d - feels better today Eating breakfast Denies shortness of breath or chest pain     Brief history 64 year old lady with history of diabetes hypertension gastroparesis stroke CKD stage III with nausea vomiting decreased oral intake.  She has a history of gastroparesis followed by GI.  Longstanding history of CKD with recent progression baseline serum creatinine by 3.44 mg/dL does not have a nephrologist but recently referred to [facility].  On arrival in the emergency room blood pressure was elevated BUN 138 creatinine elevated to 12.49.  She is not on ACE inhibitor or ARB at denies NSAID use.  She appeared clinically dehydrated and was placed on IV sodium bicarbonate 150 cc an hour.  Renal ultrasound did not show any evidence of hydronephrosis but diffuse increased cortical echogenicity.  Patient deemed end-stage renal disease.    Objective:  Vital signs in last 24 hours:  Temp:  [98 F (36.7 C)-99.5 F (37.5 C)] 98.6 F (37 C) (11/22 0556) Pulse Rate:  [64-69] 64 (11/22 0556) Resp:  [11-27] 16 (11/22 0556) BP: (123-212)/(49-93) 190/60 (11/22 0556) SpO2:  [95 %-100 %] 97 % (11/22 0556) Weight:  [73.5 kg-75.7 kg] 73.5 kg (11/21 1738)  Weight change:  Filed Weights   07/27/20 0850 07/30/20 1325 07/30/20 1738  Weight: 78 kg 75.7 kg 73.5 kg    Intake/Output: I/O last 3 completed shifts: In: -  Out: 1832 [Other:1832]   Intake/Output this shift:  No intake/output data recorded.  General adult female in bed in no acute distress HEENT normocephalic atraumatic extraocular movements intact sclera anicteric Neck supple trachea midline Lungs clear to auscultation  bilaterally normal work of breathing at rest  Heart S1S2; no rub appreciated Abdomen soft nontender nondistended Extremities no edema  Psych normal mood and affect Neuro alert and oriented x 3 provides hx and follows commands Access: RIJ tunneled catheter and left AVF with bruit and thrill   Basic Metabolic Panel: Recent Labs  Lab 07/25/20 0341 07/26/20 0235 07/26/20 1322 07/26/20 1706 07/27/20 0033 07/27/20 0033 07/28/20 0159 07/28/20 0159 07/29/20 0639 07/30/20 0145 07/31/20 0434  NA 140   < >  --    < > 140  --  141  --  139 139 139  K 3.0*   < >  --    < > 3.0*  --  3.4*  --  3.2* 3.3* 3.3*  CL 92*   < >  --    < > 92*  --  95*  --  100 101 99  CO2 24   < >  --    < > 27  --  26  --  25 27 28   GLUCOSE 71   < >  --    < > 92  --  70  --  77 82 138*  BUN 113*   < >  --    < > 61*  --  59*  --  28* 36* 14  CREATININE 10.51*   < >  --    < > 7.25*  --  6.92*  --  3.80* 4.67* 2.93*  CALCIUM 5.6*   < >  --    < >  7.2*   < > 6.4*   < > 6.9* 8.0* 8.0*  MG 1.8  --  1.6*  --   --   --  2.0  --   --   --   --   PHOS >30.0*   < >  --    < > 8.3*  --  9.8*  --  3.8 5.1* 2.2*   < > = values in this interval not displayed.    CBC: Recent Labs  Lab 07/25/20 0341 07/25/20 1637 07/27/20 0033 07/28/20 0159  WBC 7.2 7.1 7.1 8.0  HGB 8.3* 9.2* 9.0* 8.1*  HCT 25.2* 27.5* 28.2* 26.9*  MCV 84.6 85.7 87.6 91.2  PLT 303 341 322 301    Microbiology: Results for orders placed or performed during the hospital encounter of 07/23/20  Respiratory Panel by RT PCR (Flu A&B, Covid) - Nasopharyngeal Swab     Status: None   Collection Time: 07/23/20  6:20 AM   Specimen: Nasopharyngeal Swab  Result Value Ref Range Status   SARS Coronavirus 2 by RT PCR NEGATIVE NEGATIVE Final    Comment: (NOTE) SARS-CoV-2 target nucleic acids are NOT DETECTED.  The SARS-CoV-2 RNA is generally detectable in upper respiratoy specimens during the acute phase of infection. The lowest concentration of SARS-CoV-2  viral copies this assay can detect is 131 copies/mL. A negative result does not preclude SARS-Cov-2 infection and should not be used as the sole basis for treatment or other patient management decisions. A negative result may occur with  improper specimen collection/handling, submission of specimen other than nasopharyngeal swab, presence of viral mutation(s) within the areas targeted by this assay, and inadequate number of viral copies (<131 copies/mL). A negative result must be combined with clinical observations, patient history, and epidemiological information. The expected result is Negative.  Fact Sheet for Patients:  PinkCheek.be  Fact Sheet for Healthcare Providers:  GravelBags.it  This test is no t yet approved or cleared by the [place] FDA and  has been authorized for detection and/or diagnosis of SARS-CoV-2 by FDA under an Emergency Use Authorization (EUA). This EUA will remain  in effect (meaning this test can be used) for the duration of the COVID-19 declaration under Section 564(b)(1) of the Act, 21 U.S.C. section 360bbb-3(b)(1), unless the authorization is terminated or revoked sooner.     Influenza A by PCR NEGATIVE NEGATIVE Final   Influenza B by PCR NEGATIVE NEGATIVE Final    Comment: (NOTE) The Xpert Xpress SARS-CoV-2/FLU/RSV assay is intended as an aid in  the diagnosis of influenza from Nasopharyngeal swab specimens and  should not be used as a sole basis for treatment. Nasal washings and  aspirates are unacceptable for Xpert Xpress SARS-CoV-2/FLU/RSV  testing.  Fact Sheet for Patients: PinkCheek.be  Fact Sheet for Healthcare Providers: GravelBags.it  This test is not yet approved or cleared by the [place] FDA and  has been authorized for detection and/or diagnosis of SARS-CoV-2 by  FDA under an Emergency Use Authorization (EUA).  This EUA will remain  in effect (meaning this test can be used) for the duration of the  Covid-19 declaration under Section 564(b)(1) of the Act, 21  U.S.C. section 360bbb-3(b)(1), unless the authorization is  terminated or revoked. Performed at [facility], [place] [address], [place], [place] [address]    Imaging: No results found.   Medications:    . amLODipine  10 mg Oral Daily  . aspirin  325 mg Oral Daily  . atorvastatin  40 mg Oral QPM  . calcium acetate  2,001 mg Oral TID WC  . calcium carbonate  1 tablet Oral BID WC  . carvedilol  12.5 mg Oral BID WC  . Chlorhexidine Gluconate Cloth  6 each Topical Q0600  . darbepoetin (ARANESP) injection - NON-DIALYSIS  150 mcg Subcutaneous Q Mon-1800  . DULoxetine  60 mg Oral Daily  . feeding supplement  237 mL Oral Q24H  . heparin  5,000 Units Subcutaneous Q8H  . metoCLOPramide  5 mg Oral BID AC  . multivitamin  1 tablet Oral QHS  . nicotine  21 mg Transdermal Daily  . pantoprazole  40 mg Oral Daily   acetaminophen **OR** acetaminophen, hydrALAZINE, lidocaine (PF)  Assessment/ Plan:  1. Acute kidney injury on CKD IV/V due to severe dehydration caused by intractable nausea, vomiting and decreased oral intake.  She was felt to have progressed to ESRD.  Her CKD is due to diabetic nephropathy.Renal ultrasound showed no evidence of hydronephrosis.   Underwent tunnel catheter and AV fistula placement 07/27/2020 left brachiocephalic. - Per report not yet financially cleared - awaiting outpatient HD unit. Will d/w HD SW - She does need to remain inpatient until an HD unit has been confirmed  - Next HD on 11/23 per holiday schedule - would transition off of cymbalta per primary team given her ESRD - strict ins/outs ordered  2.Intractable nausea vomiting due to gastroparesis: She has a history of diabetes.  Followed by GI as outpatient.  Continue supportive care, antiemetics per primary team.  3 Anion gap metabolic acidosis due to  renal failure.  Improved IV bicarbonate discontinued.  Patient is now dialysis dependent ESRD  4.Anemia of CKD, chronic illness: Iron sats adequate at 41% continues on darbepoetin 150 mcg weekly  5.CKD MBD,   PTH 81 - no calcitriol needed.  Hyperphosphatemia - on calcium acetate  6.Hypertension: add hydralazine.  Note am coreg missed on 11/21  7.  Hypokalemia noted. Ordered 10 meq K and will plan for high K bath tomorrow   Tina Desanctis, MD 07/31/2020 7:41 AM   hypophos - hold binders today and anticipate will need a lower dose.    Tina Desanctis, MD 10:23 AM 07/31/2020  Spoke with HD SW.  She is now cleared for HD MWF schedule at [facility] for outpatient unit.  She will need outpatient HD on 11/23 schedule per holiday schedule this week.  Will need to resume binders per outpatient trends.  (will need a Lower dose of calcium acetate.)  Tina Desanctis, MD 11:25 AM 07/31/2020

## 2020-07-31 NOTE — Discharge Summary (Signed)
Physician Discharge Summary  Tina Gomez:[id number] DOB: 1960-11-21 DOA: 07/23/2020  PCP: Charlott Rakes, MD  Admit date: 07/23/2020 Discharge date: 07/31/2020  Admitted From: Home Discharge disposition: Home   Code Status: Full Code  Diet Recommendation: Renal diet  Discharge Diagnosis:   Principal Problem:   ARF (acute renal failure) ([facility]) Active Problems:   Cerebral infarction ([facility])   Type 2 diabetes mellitus with diabetic neuropathy, unspecified ([facility])   Essential hypertension, benign   GERD (gastroesophageal reflux disease)   Nausea and vomiting   Dehydration   Emphysema of lung (HCC)   Diabetic gastroparesis (HCC)   Protein-calorie malnutrition, severe  History of Present Illness / Brief narrative:  Tina J Bowmanis an 64 y.o.femalewith PMH significant for DM 2, HTN, CKD with baseline creatinine 3-4, COPD with ongoing tobacco use, history of CVA with right hemiplegia and recent diagnosis of diabetic gastroparesis. Patient presented to the ED on 11/14 with intractable nausea and vomiting.  She was recently diagnosed with diabetic gastroparesis and follows with GI.  For about a week, she had intractable vomiting, poor oral intake, and falling urine output. Associated with weakness, fatigue.   In the ED, blood pressure was elevated, BUN/creatinine was 138/12.49, serum bicarb level was low at 11. She was given normal saline.  Admitted to hospitalist service. Nephrology consultation was called. She was subsequently initiated on dialysis  Subjective:  Patient was seen and examined this morning. Pleasant.  Propped up in bed.  Not in distress.  Wants to go home  Hospital Course:  Acute kidney injury on chronic kidney disease, stage IV-V New ESRD -Likely secondary to severe dehydration from intractable nausea and vomiting as well as poor oral intake, with underlining of CKD due to diabetic nephropathy.  Renal ultrasound did not show any obstruction. -11/18,  patient underwent tunneled catheter and AV fistula placement by vascular surgery.   -She has had 4 sessions of dialysis since 11/18, last session yesterday 11/21. -Outpatient dialysis chair arranged per renal navigator.  Patient is planned for MWF dialysis as an outpatient.  However she will also go for dialysis tomorrow 11/23 for holiday schedule.  Accelerated hypertension -Currently on Coreg and amlodipine. Apparently she missed her a.m. dose of Coreg on 11/21.  Blood pressure remains elevated this morning.  Hydralazine added by nephrology. -Discharge home on Coreg, amlodipine and hydralazine.  Intractable nausea and vomiting due to gastroparesis -Nausea improved with the scheduled Reglan.  Currently on Reglan twice daily scheduled.  Continue the same at discharge  Prolonged QTC -QTC prolonged at 505 ms on EKG from 11/18. -Patient needs Reglan.  She is not on any other QTC prolonging medications.  Electrolytes need to be monitored closely as an outpatient with dialysis  Hypokalemia -Potassium level continues to remain low, 3.3 this morning.  10 mEq oral potassium given by nephrology.  Noted a plan for high potassium bath on dialysis tomorrow.  Hyperphosphatemia/hypophosphatemia -Phosphorus level  was initially elevated to more than 30.  Improved with dialysis and phosphate binders.  Phosphorus level is actually low at 2.2 this morning.  Discussed with nephrologist Dr. Royce Macadamia.  Calcium acetate was held this morning.  However, she will need to continue lower dose of calcium acetate as an outpatient.  I ordered for the same at discharge. Recent Labs  Lab 07/25/20 0341 07/26/20 0235 07/26/20 1322 07/26/20 1706 07/27/20 0033 07/28/20 0159 07/29/20 0639 07/30/20 0145 07/31/20 0434  K 3.0*   < >  --    < > 3.0* 3.4*  3.2* 3.3* 3.3*  MG 1.8  --  1.6*  --   --  2.0  --   --   --   PHOS >30.0*   < >  --    < > 8.3* 9.8* 3.8 5.1* 2.2*   < > = values in this interval not displayed.    Hypocalcemia -Given IV replacement as needed -Currently also on scheduled oral replacement. Recent Labs  Lab 07/27/20 0033 07/28/20 0159 07/29/20 0639 07/30/20 0145 07/31/20 0434  CALCIUM 7.2* 6.4* 6.9* 8.0* 8.0*   Anemia of chronic disease -Epogen per renal -Hemoglobin stable between 8-9 Recent Labs    05/25/20 0245 05/25/20 1053 07/24/20 0343 07/24/20 0343 07/25/20 0341 07/25/20 0341 07/25/20 1637 07/25/20 1637 07/27/20 0033 07/28/20 0159  HGB 9.8*   < > 8.2*  --  8.3*  --  9.2*  --  9.0* 8.1*  MCV 90.3   < > 88.3   < > 84.6   < > 85.7   < > 87.6 91.2  VITAMINB12 218  --   --   --   --   --   --   --   --   --   FOLATE 9.1  --   --   --   --   --   --   --   --   --   FERRITIN 147  --  201  --   --   --   --   --   --   --   TIBC 165*  --  140*  --   --   --   --   --   --   --   IRON 22*  --  58  --   --   --   --   --   --   --   RETICCTPCT 1.7  --   --   --   --   --   --   --   --   --    < > = values in this interval not displayed.   Diabetes mellitus, type II -A1c 7.9 on 9/16. -Target less than 8 on high risk patient because of chronic vomiting, ESRD status. -Currently on sliding scale insulin only. -Continue Accu-Cheks.  GERD -Continue PPI  Anxiety/depression -Continue duloxetine  History of CVA -Continue aspirin, statin  COPD -Currently stable and compensated -Continue bronchodilators  Tobacco abuse -Cessation discussed, continue nicotine patch  Superficial thrombophlebitis of the lower extremity -Lower extremity Doppler negative for DVT  Stable for discharge to home today.  Wound care: Incision (Closed) 07/27/20 Neck Right (Active)  Date First Assessed/Time First Assessed: 07/27/20 1130   Location: Neck  Location Orientation: Right    Assessments 07/27/2020  1:02 PM 07/30/2020  7:36 AM  Dressing Type Gauze (Comment);Tape dressing --  Dressing Clean;Dry;Intact Clean;Dry;Intact  Site / Wound Assessment Dressing in place /  Unable to assess --  Drainage Amount None --  Drainage Description No odor --     No Linked orders to display     Incision (Closed) 07/27/20 Arm Left (Active)  Date First Assessed/Time First Assessed: 07/27/20 1239   Location: Arm  Location Orientation: Left    Assessments 07/27/2020  1:02 PM 07/28/2020  8:00 PM  Dressing Type Liquid skin adhesive Liquid skin adhesive  Dressing Clean;Dry;Intact Clean;Dry;Intact  Site / Wound Assessment Clean;Dry Red;Other (Comment)  Margins Attached edges (approximated) Attached edges (approximated)  Closure Skin glue --  Drainage Amount  None None  Drainage Description No odor --     No Linked orders to display    Discharge Exam:   Vitals:   07/30/20 2022 07/31/20 0556 07/31/20 1029 07/31/20 1030  BP: (!) 150/65 (!) 190/60 (!) 143/57   Pulse: 64 64  62  Resp: 17 16    Temp: 98.4 F (36.9 C) 98.6 F (37 C)    TempSrc: Oral Oral    SpO2: 95% 97%    Weight:      Height:        Body mass index is 29.64 kg/m.  General exam: Comfortable.  Not in physical distress. Skin: No rashes, lesions or ulcers. HEENT: Atraumatic, normocephalic, no obvious bleeding Lungs: Clear to auscultation bilaterally CVS: Regular rate and rhythm, no murmur GI/Abd soft, nontender, nondistended, bowel sound present CNS: Alert, awake, oriented x3 Psychiatry: Mood appropriate Extremities: No pedal edema, no calf tenderness.  Left arm with fresh AV fistula  Follow ups:   Discharge Instructions    Diet - low sodium heart healthy   Complete by: As directed    Renal diet   Increase activity slowly   Complete by: As directed    No dressing needed   Complete by: As directed       Follow-up Information    Cherre Robins, MD In 4 weeks.   Specialties: Vascular Surgery, Interventional Cardiology Why: Office will call you to arrange your appt (sent) Contact information: [place] [address] [place]. Go on 08/24/2020.   Why: Please attend your hospital discharge appt with Freeman Caldron, PA, on Thursday, 08/24/20, at 1:50pm. Contact information: [address] [place] [address] [phone]              Recommendations for Outpatient Follow-Up:   1. Follow-up with nephrology as an outpatient 2. Follow-up with PCP as an outpatient  Discharge Instructions:  Follow with Primary MD Charlott Rakes, MD in 7 days   Get CBC/BMP checked in next visit within 1 week by PCP or SNF MD ( we routinely change or add medications that can affect your baseline labs and fluid status, therefore we recommend that you get the mentioned basic workup next visit with your PCP, your PCP may decide not to get them or add new tests based on their clinical decision)  On your next visit with your PCP, please Get Medicines reviewed and adjusted.  Please request your PCP  to go over all Hospital Tests and Procedure/Radiological results at the follow up, please get all Hospital records sent to your Prim MD by signing hospital release before you go home.  Activity: As tolerated with Full fall precautions use walker/cane & assistance as needed  For Heart failure patients - Check your Weight same time everyday, if you gain over 2 pounds, or you develop in leg swelling, experience more shortness of breath or chest pain, call your Primary MD immediately. Follow Cardiac Low Salt Diet and 1.5 lit/day fluid restriction.  If you have smoked or chewed Tobacco in the last 2 yrs please stop smoking, stop any regular Alcohol  and or any Recreational drug use.  If you experience worsening of your admission symptoms, develop shortness of breath, life threatening emergency, suicidal or homicidal thoughts you must seek medical attention immediately by calling 911 or calling your MD immediately  if symptoms less severe.  You Must read complete instructions/literature along with  all the  possible adverse reactions/side effects for all the Medicines you take and that have been prescribed to you. Take any new Medicines after you have completely understood and accpet all the possible adverse reactions/side effects.   Do not drive, operate heavy machinery, perform activities at heights, swimming or participation in water activities or provide baby sitting services if your were admitted for syncope or siezures until you have seen by Primary MD or a Neurologist and advised to do so again.  Do not drive when taking Pain medications.  Do not take more than prescribed Pain, Sleep and Anxiety Medications  Wear Seat belts while driving.   Please note You were cared for by a hospitalist during your hospital stay. If you have any questions about your discharge medications or the care you received while you were in the hospital after you are discharged, you can call the unit and asked to speak with the hospitalist on call if the hospitalist that took care of you is not available. Once you are discharged, your primary care physician will handle any further medical issues. Please note that NO REFILLS for any discharge medications will be authorized once you are discharged, as it is imperative that you return to your primary care physician (or establish a relationship with a primary care physician if you do not have one) for your aftercare needs so that they can reassess your need for medications and monitor your lab values.    Allergies as of 07/31/2020      Reactions   Codeine Nausea And Vomiting   Adhesive [tape] Other (See Comments)   Tape breaks out the skin if it is left on for a lengthy period of time      Medication List    STOP taking these medications   amoxicillin 500 MG capsule Commonly known as: AMOXIL   cetirizine 10 MG tablet Commonly known as: ZYRTEC   clarithromycin 500 MG tablet Commonly known as: Biaxin   Olopatadine HCl 0.2 % Soln Commonly known as: Pataday    ondansetron 4 MG disintegrating tablet Commonly known as: Zofran ODT   promethazine 25 MG suppository Commonly known as: PHENERGAN   Suprep Bowel Prep Kit 17.5-3.13-1.6 GM/177ML Soln Generic drug: Na Sulfate-K Sulfate-Mg Sulf     TAKE these medications   amLODipine 10 MG tablet Commonly known as: NORVASC Take 1 tablet (10 mg total) by mouth daily. Start taking on: August 01, 2020   aspirin 325 MG tablet Take 1 tablet (325 mg total) by mouth daily.   atorvastatin 40 MG tablet Commonly known as: LIPITOR TAKE 1 TABLET BY MOUTH DAILY AT 6 PM. What changed:   how much to take  how to take this  when to take this  additional instructions   blood glucose meter kit and supplies Use as instructed   True Metrix Meter Devi 1 each by Does not apply route 3 (three) times daily before meals.   calcium carbonate 1250 (500 Ca) MG tablet Commonly known as: OS-CAL - dosed in mg of elemental calcium Take 1 tablet (500 mg of elemental calcium total) by mouth 2 (two) times daily with a meal.   carvedilol 12.5 MG tablet Commonly known as: COREG Take 1 tablet (12.5 mg total) by mouth 2 (two) times daily with a meal.   DULoxetine 60 MG capsule Commonly known as: Cymbalta Take 1 capsule (60 mg total) by mouth daily.   freestyle lancets Use as instructed   TRUEplus Lancets 28G Misc 1 each  by Does not apply route 3 (three) times daily before meals.   hydrALAZINE 25 MG tablet Commonly known as: APRESOLINE Take 1 tablet (25 mg total) by mouth every 8 (eight) hours.   metoCLOPramide 5 MG tablet Commonly known as: REGLAN Take 1 tablet (5 mg total) by mouth 2 (two) times daily before a meal. What changed: when to take this   omeprazole 20 MG capsule Commonly known as: PRILOSEC Take 1 capsule (20 mg total) by mouth 2 (two) times daily before a meal.   TRUEtest Test test strip Generic drug: glucose blood Use as instructed            Discharge Care Instructions  (From  admission, onward)         Start     Ordered   07/31/20 0000  No dressing needed        07/31/20 1144          Time coordinating discharge: 35 minutes  The results of significant diagnostics from this hospitalization (including imaging, microbiology, ancillary and laboratory) are listed below for reference.    Procedures and Diagnostic Studies:   US RENAL  Result Date: 07/23/2020 CLINICAL DATA:  ACI EXAM: RENAL / URINARY TRACT ULTRASOUND COMPLETE COMPARISON:  September fifteenth, 2021 FINDINGS: Right Kidney: Renal measurements: 12.4 x 5.6 x 5.6 cm = volume: 203 mL. Echogenicity is diffusely increased. No mass or hydronephrosis visualized. Left Kidney: Renal measurements: 13.5 x 5.6 x 5.5 cm = volume: 215 mL. Echogenicity is diffusely increased. No hydronephrosis. In the superior pole, there is an anechoic mass with posterior acoustic enhancement consistent with a simple cyst which measures 1.7 x 1.3 x 1.0 cm. Bladder: Decompressed and not visualized. Other: None. IMPRESSION: 1. Diffusely increased renal cortical echogenicity as can be seen in medical renal disease. No hydronephrosis. Electronically Signed   By: Valentino Saxon MD   On: 07/23/2020 14:47   VAS [place] LOWER EXTREMITY VENOUS (DVT) (ONLY [facility] & [facility])  Result Date: 07/24/2020  Lower Venous DVT Study Indications: Palpable cord.  Risk Factors: None identified. Comparison Study: No prior studies. Performing Technologist: Oliver Hum RVT  Examination Guidelines: A complete evaluation includes B-mode imaging, spectral Doppler, color Doppler, and power Doppler as needed of all accessible portions of each vessel. Bilateral testing is considered an integral part of a complete examination. Limited examinations for reoccurring indications may be performed as noted. The reflux portion of the exam is performed with the patient in reverse Trendelenburg.  +-----+---------------+---------+-----------+----------+--------------+  RIGHTCompressibilityPhasicitySpontaneityPropertiesThrombus Aging +-----+---------------+---------+-----------+----------+--------------+ CFV  Full           Yes      Yes                                 +-----+---------------+---------+-----------+----------+--------------+   +---------+---------------+---------+-----------+----------+--------------+ LEFT     CompressibilityPhasicitySpontaneityPropertiesThrombus Aging +---------+---------------+---------+-----------+----------+--------------+ CFV      Full           Yes      Yes                                 +---------+---------------+---------+-----------+----------+--------------+ SFJ      Full                                                        +---------+---------------+---------+-----------+----------+--------------+  FV Prox  Full                                                        +---------+---------------+---------+-----------+----------+--------------+ FV Mid   Full                                                        +---------+---------------+---------+-----------+----------+--------------+ FV DistalFull                                                        +---------+---------------+---------+-----------+----------+--------------+ PFV      Full                                                        +---------+---------------+---------+-----------+----------+--------------+ POP      Full           Yes      Yes                                 +---------+---------------+---------+-----------+----------+--------------+ PTV      Full                                                        +---------+---------------+---------+-----------+----------+--------------+ PERO     Full                                                        +---------+---------------+---------+-----------+----------+--------------+ Gastroc  Full                                                         +---------+---------------+---------+-----------+----------+--------------+ GSV      Full                                                        +---------+---------------+---------+-----------+----------+--------------+ SSV      Full                                                        +---------+---------------+---------+-----------+----------+--------------+  Summary: RIGHT: - No evidence of common femoral vein obstruction.  LEFT: - There is no evidence of deep vein thrombosis in the lower extremity.  - No cystic structure found in the popliteal fossa.  *See table(s) above for measurements and observations. Electronically signed by Servando Snare MD on 07/24/2020 at 4:33:22 PM.    Final      Labs:   Basic Metabolic Panel: Recent Labs  Lab 07/25/20 0341 07/26/20 0235 07/26/20 1322 07/26/20 1706 07/27/20 0033 07/27/20 0033 07/28/20 0159 07/28/20 0159 07/29/20 7001 07/29/20 0639 07/30/20 0145 07/31/20 0434  NA 140   < >  --    < > 140  --  141  --  139  --  139 139  K 3.0*   < >  --    < > 3.0*   < > 3.4*   < > 3.2*   < > 3.3* 3.3*  CL 92*   < >  --    < > 92*  --  95*  --  100  --  101 99  CO2 24   < >  --    < > 27  --  26  --  25  --  27 28  GLUCOSE 71   < >  --    < > 92  --  70  --  77  --  82 138*  BUN 113*   < >  --    < > 61*  --  59*  --  28*  --  36* 14  CREATININE 10.51*   < >  --    < > 7.25*  --  6.92*  --  3.80*  --  4.67* 2.93*  CALCIUM 5.6*   < >  --    < > 7.2*  --  6.4*  --  6.9*  --  8.0* 8.0*  MG 1.8  --  1.6*  --   --   --  2.0  --   --   --   --   --   PHOS >30.0*   < >  --    < > 8.3*  --  9.8*  --  3.8  --  5.1* 2.2*   < > = values in this interval not displayed.   GFR Estimated Creatinine Clearance: 18.5 mL/min (A) (by C-G formula based on SCr of 2.93 mg/dL (H)). Liver Function Tests: Recent Labs  Lab 07/27/20 0033 07/28/20 0159 07/29/20 0639 07/30/20 0145 07/31/20 0434  ALBUMIN 1.9* 1.8* 1.8* 1.9* 1.9*   No results  for input(s): LIPASE, AMYLASE in the last 168 hours. No results for input(s): AMMONIA in the last 168 hours. Coagulation profile No results for input(s): INR, PROTIME in the last 168 hours.  CBC: Recent Labs  Lab 07/25/20 0341 07/25/20 1637 07/27/20 0033 07/28/20 0159  WBC 7.2 7.1 7.1 8.0  HGB 8.3* 9.2* 9.0* 8.1*  HCT 25.2* 27.5* 28.2* 26.9*  MCV 84.6 85.7 87.6 91.2  PLT 303 341 322 301   Cardiac Enzymes: No results for input(s): CKTOTAL, CKMB, CKMBINDEX, TROPONINI in the last 168 hours. BNP: Invalid input(s): POCBNP CBG: Recent Labs  Lab 07/30/20 0810 07/30/20 1208 07/30/20 1824 07/30/20 2022 07/31/20 0751  GLUCAP 90 126* 154* 202* 126*   D-Dimer No results for input(s): DDIMER in the last 72 hours. Hgb A1c No results for input(s): HGBA1C in the last 72 hours. Lipid Profile No results for input(s): CHOL, HDL, LDLCALC, TRIG, CHOLHDL, LDLDIRECT in  the last 72 hours. Thyroid function studies No results for input(s): TSH, T4TOTAL, T3FREE, THYROIDAB in the last 72 hours.  Invalid input(s): FREET3 Anemia work up No results for input(s): VITAMINB12, FOLATE, FERRITIN, TIBC, IRON, RETICCTPCT in the last 72 hours. Microbiology Recent Results (from the past 240 hour(s))  Respiratory Panel by RT PCR (Flu A&B, Covid) - Nasopharyngeal Swab     Status: None   Collection Time: 07/23/20  6:20 AM   Specimen: Nasopharyngeal Swab  Result Value Ref Range Status   SARS Coronavirus 2 by RT PCR NEGATIVE NEGATIVE Final    Comment: (NOTE) SARS-CoV-2 target nucleic acids are NOT DETECTED.  The SARS-CoV-2 RNA is generally detectable in upper respiratoy specimens during the acute phase of infection. The lowest concentration of SARS-CoV-2 viral copies this assay can detect is 131 copies/mL. A negative result does not preclude SARS-Cov-2 infection and should not be used as the sole basis for treatment or other patient management decisions. A negative result may occur with  improper  specimen collection/handling, submission of specimen other than nasopharyngeal swab, presence of viral mutation(s) within the areas targeted by this assay, and inadequate number of viral copies (<131 copies/mL). A negative result must be combined with clinical observations, patient history, and epidemiological information. The expected result is Negative.  Fact Sheet for Patients:  PinkCheek.be  Fact Sheet for Healthcare Providers:  GravelBags.it  This test is no t yet approved or cleared by the [place] FDA and  has been authorized for detection and/or diagnosis of SARS-CoV-2 by FDA under an Emergency Use Authorization (EUA). This EUA will remain  in effect (meaning this test can be used) for the duration of the COVID-19 declaration under Section 564(b)(1) of the Act, 21 U.S.C. section 360bbb-3(b)(1), unless the authorization is terminated or revoked sooner.     Influenza A by PCR NEGATIVE NEGATIVE Final   Influenza B by PCR NEGATIVE NEGATIVE Final    Comment: (NOTE) The Xpert Xpress SARS-CoV-2/FLU/RSV assay is intended as an aid in  the diagnosis of influenza from Nasopharyngeal swab specimens and  should not be used as a sole basis for treatment. Nasal washings and  aspirates are unacceptable for Xpert Xpress SARS-CoV-2/FLU/RSV  testing.  Fact Sheet for Patients: PinkCheek.be  Fact Sheet for Healthcare Providers: GravelBags.it  This test is not yet approved or cleared by the [place] FDA and  has been authorized for detection and/or diagnosis of SARS-CoV-2 by  FDA under an Emergency Use Authorization (EUA). This EUA will remain  in effect (meaning this test can be used) for the duration of the  Covid-19 declaration under Section 564(b)(1) of the Act, 21  U.S.C. section 360bbb-3(b)(1), unless the authorization is  terminated or revoked. Performed at  [facility], [place] [address], [place], [place] [address]      Signed: Terrilee Croak  [facility] 07/31/2020, 11:44 AM

## 2020-07-31 NOTE — Progress Notes (Signed)
Final round on patient prior to D/C. Patient is OOB to chair tolerating her lunch well. Assessed L arm AVF for thrill and taught patient how to assess for bruit with disposable stethoscope. Patient was appreciative with no further questions related to new start HD. Plan for D/C this afternoon.  Mady Gemma Dialysis Nurse Coordinator [phone]

## 2020-08-01 ENCOUNTER — Telehealth: Payer: Self-pay

## 2020-08-01 ENCOUNTER — Other Ambulatory Visit: Payer: Self-pay | Admitting: Family Medicine

## 2020-08-01 DIAGNOSIS — K2970 Gastritis, unspecified, without bleeding: Secondary | ICD-10-CM

## 2020-08-01 DIAGNOSIS — B9681 Helicobacter pylori [H. pylori] as the cause of diseases classified elsewhere: Secondary | ICD-10-CM

## 2020-08-01 MED FILL — OMEPRAZOLE 20 MG CAP: 20 | 14 days supply | Qty: 28 | Fill #0

## 2020-08-01 NOTE — Telephone Encounter (Signed)
From the discharge call: Has appointment with Dr Margarita Rana 08/22/2020   She said she is doing pretty good. She just arrived at dialysis and they called her back for treatment. She requested this CM call back at a later time.    Call  returned to patient this afternoon. She said she is just tired and weak after dialysis.     she explained that dialysis initially told her to take the amlodipine in the afternoon on dialysis days.  Then they told her not to take it today.  When she asked it she should take it tomorrow they told her that they would figure it out.     Yes , she said that her family never leaves her alone.  She lives with her daughter and grandchildren.   independent with assist from family when needed. Has walker to use with ambulation.

## 2020-08-01 NOTE — Telephone Encounter (Signed)
Transition Care Management Follow-up Telephone Call  Date of discharge and from where: 07/31/2020, [facility]   How have you been since you were released from the hospital? She said she is doing pretty good. She just arrived at dialysis and they called her back for treatment. She requested this CM call back at a later time.   Call  returned to patient this afternoon. She said she is just tired and weak after dialysis.   Any questions or concerns? Yes - noted above regarding feeling weak after HD  Items Reviewed:  Did the pt receive and understand the discharge instructions provided? Yes   Medications obtained and verified? Yes  - she explained that dialysis initially told her to take the amlodipine in the afternoon on dialysis days.  Then they told her not to take it today.  When she asked it she should take it tomorrow they told her that they would figure it out.    Other? No   Any new allergies since your discharge? No   Do you have support at home? Yes , she said that her family never leaves her alone.  She lives with her daughter and grandchildren.  Home Care and Equipment/Supplies: Were home health services ordered?no If so, what is the name of the agency? n/a Has the agency set up a time to come to the patient's home? n/a Were any new equipment or medical supplies ordered?  No What is the name of the medical supply agency? n/a Were you able to get the supplies/equipment? N/a Do you have any questions related to the use of the equipment or supplies? No - n/a  Scheduled for dialysis M/W/F @ 1200 , [facility] Has glucometer, not on any diabetic medication at this time   Functional Questionnaire: (I = Independent and D = Dependent) ADLs:independent with assist from family when needed. Has walker to use with ambulation.    Follow up appointments reviewed:   PCP Hospital f/u appt confirmed? Yes  - Dr Margarita Rana 08/22/2020   [facility] f/u appt confirmed?  Yes [facility] - 09/05/2020  Are transportation arrangements needed? No   If their condition worsens, is the pt aware to call PCP or go to the Emergency Dept.? yes  Was the patient provided with contact information for the PCP's office or ED? yes  Was to pt encouraged to call back with questions or concerns? {yes

## 2020-08-07 ENCOUNTER — Other Ambulatory Visit: Payer: Self-pay

## 2020-08-07 DIAGNOSIS — Z20822 Contact with and (suspected) exposure to covid-19: Secondary | ICD-10-CM

## 2020-08-08 LAB — SARS-COV-2, NAA 2 DAY TAT

## 2020-08-08 LAB — NOVEL CORONAVIRUS, NAA: SARS-CoV-2, NAA: NOT DETECTED

## 2020-08-09 DIAGNOSIS — N186 End stage renal disease: Secondary | ICD-10-CM | POA: Diagnosis not present

## 2020-08-14 ENCOUNTER — Telehealth: Payer: Self-pay | Admitting: Internal Medicine

## 2020-08-14 NOTE — Telephone Encounter (Signed)
Pts daughter called and states that Friday her moms hgb was 9.6 and today it is 6.5, the pt is set up for blood this week. The PA at the dialysis center told them they may want to call and see if she could be seen sooner. Asked if her mother had seen any blood in her stool. They state she has not but they have sent them home with stool cards to complete. Pt is scheduled for colon on 08/25/20. Please advise.

## 2020-08-14 NOTE — Telephone Encounter (Signed)
My next available slot if only 1 day earlier on 12/16. I am okay if you look for an earlier slot with another [facility] provider in the [facility] if available sooner This would need to be after pRBC transfusion and after post transfusion Hgb proves improvement in Hgb

## 2020-08-14 NOTE — Telephone Encounter (Signed)
Called pts daughter to see when the transfusion is set up for. The transfusion is not scheduled until 12/20 and it is for 1 unit. They were told if she got SOB or "fell out" to go to the ER. Please advise.

## 2020-08-14 NOTE — Telephone Encounter (Signed)
Will need 1 unit of pRBC asap, ok to order and expedite if possible If not I would recommend ER in the morning for blood transfusion, I am not comfortable with an unexplained hgb drop and waiting until 08/28/20

## 2020-08-15 ENCOUNTER — Other Ambulatory Visit: Payer: Self-pay

## 2020-08-15 ENCOUNTER — Ambulatory Visit (INDEPENDENT_AMBULATORY_CARE_PROVIDER_SITE_OTHER): Payer: Self-pay | Admitting: Physician Assistant

## 2020-08-15 VITALS — BP 184/76 | HR 80 | Temp 98.6°F | Resp 20 | Ht 62.0 in | Wt 172.5 lb

## 2020-08-15 DIAGNOSIS — N186 End stage renal disease: Secondary | ICD-10-CM

## 2020-08-15 DIAGNOSIS — Z992 Dependence on renal dialysis: Secondary | ICD-10-CM

## 2020-08-15 DIAGNOSIS — D649 Anemia, unspecified: Secondary | ICD-10-CM

## 2020-08-15 NOTE — Progress Notes (Signed)
    Postoperative Access Visit   History of Present Illness   Tina Gomez is a 64 y.o. year old female who presents for postoperative follow-up for right IJ [facility] and left brachiobasilic AV fistula by Dr. Stanford Breed on 07/27/20. The patient's wounds are healing well. Suture sticking out from scabbed area. She does have a little coldness in her left hand compared to right since surgery. She says this is not painful. She does have some swelling in area of incision which has been present since surgery. She says this is minimally tender. Otherwise notes no steal symptoms.   She is currently dialyzing MWF with right IJ TDC at the [facility] location  Physical Examination   Vitals:   08/15/20 1324  BP: (!) 184/76  Pulse: 80  Resp: 20  Temp: 98.6 F (37 C)  TempSrc: Temporal  SpO2: 95%  Weight: 172 lb 8 oz (78.2 kg)  Height: 5\' 2"  (1.575 m)   Body mass index is 31.55 kg/m.  left arm Incision is healing well, 2+ radial pulse, hand grip is 5/5, sensation in digits is  intact, palpable thrill. Monocryl suture present- trimmed. No erythema. There is small hematoma around incision     Medical Decision Making   Tina Gomez is a 64 y.o. year old female who presents s/p right IJ [facility] and left brachiobasilic AV fistula by Dr. Stanford Breed on 07/27/20. She had a small retained Monocryl suture that I trimmed. She does have a hematoma at her incision site. No signs of infection. Very minimal tenderness. I recommended that she use warm compresses on this area.  Patent without signs or symptoms of steal syndrome  She has her 4-6 week post op appointment with fistula duplex on 12/28 and she will keep this appointment   Karoline Caldwell, PA-C [facility] [place] Office: [phone]  Clinic MD: Dr. Stanford Breed

## 2020-08-15 NOTE — Telephone Encounter (Signed)
Order in epic for CBC on Thursday. Left message for pts daughter to call back.  Spoke with pts daughter and she is aware.

## 2020-08-15 NOTE — Telephone Encounter (Signed)
Thanks, thereafter on 08/17/20 she should have CBC and if improved and adequate she can proceed with colonoscopy either with me on 12/16 or 12/17 or with another provider if available sooner If scheduled with another provider please let me know - I am chose to repeat CBC before her colonoscopy to ensure it remains stable and appropriate for [place] Thank you

## 2020-08-15 NOTE — Telephone Encounter (Signed)
Pt scheduled for 1 unit blood tomorrow at [facility] Short stay at 8am. Pts daughter aware of appt. Orders in epic.

## 2020-08-16 ENCOUNTER — Other Ambulatory Visit: Payer: Self-pay

## 2020-08-16 ENCOUNTER — Ambulatory Visit (HOSPITAL_COMMUNITY)
Admission: RE | Admit: 2020-08-16 | Discharge: 2020-08-16 | Disposition: A | Discharge status: Home / Self Care | Payer: Medicaid Other | Source: Ambulatory Visit | Attending: Internal Medicine | Admitting: Internal Medicine

## 2020-08-16 DIAGNOSIS — D649 Anemia, unspecified: Secondary | ICD-10-CM | POA: Insufficient documentation

## 2020-08-16 LAB — PREPARE RBC (CROSSMATCH)

## 2020-08-16 LAB — ABO/RH: ABO/RH(D): O POS

## 2020-08-16 MED ORDER — SODIUM CHLORIDE 0.9% IV SOLUTION: Freq: Once | INTRAVENOUS | Status: DC

## 2020-08-16 NOTE — Progress Notes (Signed)
Pt BP 204/64 at 4min post transfusion mark.  Pt stated she does have high BP and they switched her BP meds to the afternoon because she "bottoms out at dialysis".  Pt has dialysis this afternoon. PT denies any symptoms like headache or dizziness with this BP.  Will monitor pt.

## 2020-08-17 LAB — TYPE AND SCREEN
ABO/RH(D): O POS
Antibody Screen: NEGATIVE
Unit division: 0

## 2020-08-17 LAB — BPAM RBC
Blood Product Expiration Date: 202201112359
ISSUE DATE / TIME: 202112081048
Unit Type and Rh: 5100

## 2020-08-22 ENCOUNTER — Other Ambulatory Visit: Payer: Self-pay

## 2020-08-22 ENCOUNTER — Ambulatory Visit: Payer: Medicaid Other | Attending: Family Medicine | Admitting: Family Medicine

## 2020-08-22 ENCOUNTER — Encounter: Payer: Self-pay | Admitting: Family Medicine

## 2020-08-22 VITALS — BP 160/78 | HR 82 | Ht 62.0 in | Wt 172.0 lb

## 2020-08-22 DIAGNOSIS — I120 Hypertensive chronic kidney disease with stage 5 chronic kidney disease or end stage renal disease: Secondary | ICD-10-CM

## 2020-08-22 DIAGNOSIS — N186 End stage renal disease: Secondary | ICD-10-CM

## 2020-08-22 DIAGNOSIS — E1122 Type 2 diabetes mellitus with diabetic chronic kidney disease: Secondary | ICD-10-CM | POA: Diagnosis not present

## 2020-08-22 DIAGNOSIS — N185 Chronic kidney disease, stage 5: Secondary | ICD-10-CM

## 2020-08-22 DIAGNOSIS — Z992 Dependence on renal dialysis: Secondary | ICD-10-CM

## 2020-08-22 NOTE — Progress Notes (Signed)
Subjective:  Patient ID: Tina Gomez, female    DOB: 1956/04/26  Age: 64 y.o. MRN: [id number]  CC: Hospitalization Follow-up   HPI Tina Gomez is a20year-old female withahistory of hypertension, hyperlipidemia, type 2 diabetes mellitus (diet controlled with A1c7.9), GERD, ESRD on HD (M,W,F at [facility]) HD wears her out feels fatigued today.  Her son-in-law assist with transporting her to hemodialysis sessions and to her appointments and she is currently working on obtaining Medicare and Medicaid. She had a visit with vascular last week due to presence of hematoma at incision site and was advised to use warm compress.  Her BP is elevated and she took her antihypertensive this morning.  She was advised not to take her antihypertensive and dialysis days.  Her type 2 diabetes mellitus is diet controlled and she denies presence of hypoglycemia. She has no nausea, vomiting which she previously had.  Past Medical History:  Diagnosis Date  . Hypertension   . Stroke [facility])     Past Surgical History:  Procedure Laterality Date  . AV FISTULA PLACEMENT Left 07/27/2020   Procedure: LEFT BRACHIO-BASILIC ARTERIOVENOUS (AV) FISTULA CREATION;  Surgeon: Cherre Robins, MD;  Location: [place];  Service: Vascular;  Laterality: Left;  . CESAREAN SECTION    . ESOPHAGOGASTRODUODENOSCOPY (EGD) WITH PROPOFOL N/A 05/26/2020   Procedure: ESOPHAGOGASTRODUODENOSCOPY (EGD) WITH PROPOFOL;  Surgeon: Irene Shipper, MD;  Location: [facility] ENDOSCOPY;  Service: Endoscopy;  Laterality: N/A;  . INSERTION OF DIALYSIS CATHETER Right 07/27/2020   Procedure: INSERTION OF RIGHT INTERNAL JUGULAR TUNNELED DIALYSIS CATHETER;  Surgeon: Cherre Robins, MD;  Location: [place];  Service: Vascular;  Laterality: Right;  . IR FLUORO GUIDE CV LINE RIGHT  07/25/2020  . IR US GUIDE VASC ACCESS RIGHT  07/25/2020    Family History  Problem Relation Age of Onset  . Stroke Mother   . Hypertension Mother   . Hyperlipidemia Mother    . Hypertension Father   . Hyperlipidemia Father     Allergies  Allergen Reactions  . Codeine Nausea And Vomiting  . Adhesive [Tape] Other (See Comments)    Tape breaks out the skin if it is left on for a lengthy period of time    Outpatient Medications Prior to Visit  Medication Sig Dispense Refill  . amLODipine (NORVASC) 10 MG tablet Take 1 tablet (10 mg total) by mouth daily. 30 tablet 0  . aspirin 325 MG tablet Take 1 tablet (325 mg total) by mouth daily. 90 tablet 1  . atorvastatin (LIPITOR) 40 MG tablet TAKE 1 TABLET BY MOUTH DAILY AT 6 PM. (Patient taking differently: Take 40 mg by mouth every evening.) 90 tablet 1  . Blood Glucose Monitoring Suppl (BLOOD GLUCOSE METER) kit Use as instructed 1 each 0  . Blood Glucose Monitoring Suppl (TRUE METRIX METER) DEVI 1 each by Does not apply route 3 (three) times daily before meals. 1 each 0  . calcium carbonate (OS-CAL - DOSED IN MG OF ELEMENTAL CALCIUM) 1250 (500 Ca) MG tablet Take 1 tablet (500 mg of elemental calcium total) by mouth 2 (two) times daily with a meal. 60 tablet 0  . carvedilol (COREG) 12.5 MG tablet Take 1 tablet (12.5 mg total) by mouth 2 (two) times daily with a meal. 60 tablet 3  . DULoxetine (CYMBALTA) 60 MG capsule Take 1 capsule (60 mg total) by mouth daily. 90 capsule 1  . glucose blood (TRUETEST TEST) test strip Use as instructed 100 each 12  .  hydrALAZINE (APRESOLINE) 25 MG tablet Take 1 tablet (25 mg total) by mouth every 8 (eight) hours. 90 tablet 0  . Lancets (FREESTYLE) lancets Use as instructed 100 each 12  . metoCLOPramide (REGLAN) 5 MG tablet Take 1 tablet (5 mg total) by mouth 2 (two) times daily before a meal. 60 tablet 0  . TRUEplus Lancets 28G MISC 1 each by Does not apply route 3 (three) times daily before meals. 100 each 12  . omeprazole (PRILOSEC) 20 MG capsule TAKE 1 CAPSULE (20 MG TOTAL) BY MOUTH 2 (TWO) TIMES DAILY BEFORE A MEAL. 28 capsule 0   No facility-administered medications prior to visit.      ROS Review of Systems  Constitutional: Positive for fatigue. Negative for activity change and appetite change.  HENT: Negative for congestion, sinus pressure and sore throat.   Eyes: Negative for visual disturbance.  Respiratory: Negative for cough, chest tightness, shortness of breath and wheezing.   Cardiovascular: Negative for chest pain and palpitations.  Gastrointestinal: Negative for abdominal distention, abdominal pain and constipation.  Endocrine: Negative for polydipsia.  Genitourinary: Negative for dysuria and frequency.  Musculoskeletal: Negative for arthralgias and back pain.  Skin: Negative for rash.  Neurological: Negative for tremors, light-headedness and numbness.  Hematological: Does not bruise/bleed easily.  Psychiatric/Behavioral: Negative for agitation and behavioral problems.    Objective:  BP (!) 160/78   Pulse 82   Ht 5' 2"  (1.575 m)   Wt 172 lb (78 kg)   SpO2 97%   BMI 31.46 kg/m   BP/Weight 08/22/2020 08/16/2020 62/04/3661  Systolic BP 947 654 650  Diastolic BP 78 64 76  Wt. (Lbs) 172 - 172.5  BMI 31.46 - 31.55      Physical Exam Constitutional:      Appearance: She is well-developed.  Neck:     Vascular: No JVD.  Cardiovascular:     Rate and Rhythm: Normal rate.     Heart sounds: Normal heart sounds. No murmur heard.   Pulmonary:     Effort: Pulmonary effort is normal.     Breath sounds: Normal breath sounds. No wheezing or rales.  Chest:     Chest wall: No tenderness.  Abdominal:     General: Bowel sounds are normal. There is no distension.     Palpations: Abdomen is soft. There is no mass.     Tenderness: There is no abdominal tenderness.  Musculoskeletal:        General: Normal range of motion.     Right lower leg: No edema.     Left lower leg: No edema.     Comments: L arm AV fistula  Neurological:     Mental Status: She is alert and oriented to person, place, and time.  Psychiatric:        Mood and Affect: Mood normal.      CMP Latest Ref Rng & Units 07/31/2020 07/30/2020 07/29/2020  Glucose 70 - 99 mg/dL 138(H) 82 77  BUN 8 - 23 mg/dL 14 36(H) 28(H)  Creatinine 0.44 - 1.00 mg/dL 2.93(H) 4.67(H) 3.80(H)  Sodium 135 - 145 mmol/L 139 139 139  Potassium 3.5 - 5.1 mmol/L 3.3(L) 3.3(L) 3.2(L)  Chloride 98 - 111 mmol/L 99 101 100  CO2 22 - 32 mmol/L 28 27 25   Calcium 8.9 - 10.3 mg/dL 8.0(L) 8.0(L) 6.9(L)  Total Protein 6.5 - 8.1 g/dL - - -  Total Bilirubin 0.3 - 1.2 mg/dL - - -  Alkaline Phos 38 - 126 U/L - - -  AST 15 - 41 U/L - - -  ALT 0 - 44 U/L - - -    Lipid Panel     Component Value Date/Time   CHOL 251 (H) 11/25/2018 1506   TRIG 272 (H) 11/25/2018 1506   HDL 44 11/25/2018 1506   CHOLHDL 5.7 (H) 11/25/2018 1506   CHOLHDL 4.8 11/13/2016 1018   VLDL 40 (H) 02/15/2016 0907   LDLCALC 153 (H) 11/25/2018 1506    CBC    Component Value Date/Time   WBC 8.0 07/28/2020 0159   RBC 2.95 (L) 07/28/2020 0159   HGB 8.1 (L) 07/28/2020 0159   HCT 26.9 (L) 07/28/2020 0159   PLT 301 07/28/2020 0159   MCV 91.2 07/28/2020 0159   MCH 27.5 07/28/2020 0159   MCHC 30.1 07/28/2020 0159   RDW 14.4 07/28/2020 0159   LYMPHSABS 1.8 06/01/2020 1038   MONOABS 0.9 06/01/2020 1038   EOSABS 0.1 06/01/2020 1038   BASOSABS 0.1 06/01/2020 1038    Lab Results  Component Value Date   HGBA1C 7.9 (H) 05/25/2020    Assessment & Plan:  1. Hypertension in stage 5 chronic kidney disease due to type 2 diabetes mellitus ([place]) Blood pressure is elevated but given she is on hemodialysis I will hold off on adjusting her hypertension regimen to prevent hypotension during hemodialysis A1c 7.9; she is currently on diet control Goal A1c is less than 8.0 due to multiple comorbidities to prevent hypoglycemia Follow-up with nephrology Counseled on Diabetic diet, my plate method, 143 minutes of moderate intensity exercise/week Blood sugar logs with fasting goals of 80-120 mg/dl, random of less than 180 and in the event of  sugars less than 60 mg/dl or greater than 400 mg/dl encouraged to notify the clinic. Advised on the need for annual eye exams, annual foot exams, Pneumonia vaccine.  2. End stage renal disease on dialysis due to type 2 diabetes mellitus ([place]) Continue hemodialysis as per schedule She does have underlying fatigue from dialysis; hopefully this should improve over time    No orders of the defined types were placed in this encounter.   Follow-up: Return in about 6 months (around 02/20/2021) for Chronic disease management.       Charlott Rakes, MD, FAAFP. [facility] and [place], [place]   08/23/2020, 9:14 AM

## 2020-08-23 ENCOUNTER — Encounter: Payer: Self-pay | Admitting: Family Medicine

## 2020-08-23 DIAGNOSIS — E611 Iron deficiency: Secondary | ICD-10-CM | POA: Diagnosis not present

## 2020-08-24 ENCOUNTER — Inpatient Hospital Stay: Payer: Self-pay | Admitting: Physician Assistant

## 2020-08-25 ENCOUNTER — Encounter: Payer: Self-pay | Admitting: Internal Medicine

## 2020-08-28 ENCOUNTER — Encounter (HOSPITAL_COMMUNITY): Payer: Self-pay

## 2020-08-30 DIAGNOSIS — N2581 Secondary hyperparathyroidism of renal origin: Secondary | ICD-10-CM | POA: Diagnosis not present

## 2020-09-05 ENCOUNTER — Other Ambulatory Visit: Payer: Self-pay

## 2020-09-05 ENCOUNTER — Ambulatory Visit (INDEPENDENT_AMBULATORY_CARE_PROVIDER_SITE_OTHER): Payer: Self-pay | Admitting: Physician Assistant

## 2020-09-05 ENCOUNTER — Ambulatory Visit (HOSPITAL_COMMUNITY)
Admission: RE | Admit: 2020-09-05 | Discharge: 2020-09-05 | Disposition: A | Discharge status: Home / Self Care | Payer: Medicaid Other | Source: Ambulatory Visit | Attending: Physician Assistant | Admitting: Physician Assistant

## 2020-09-05 VITALS — BP 181/72 | HR 75 | Temp 97.9°F | Resp 20 | Ht 62.0 in | Wt 172.3 lb

## 2020-09-05 DIAGNOSIS — Z992 Dependence on renal dialysis: Secondary | ICD-10-CM

## 2020-09-05 DIAGNOSIS — N186 End stage renal disease: Secondary | ICD-10-CM | POA: Diagnosis not present

## 2020-09-05 NOTE — Progress Notes (Signed)
POST OPERATIVE OFFICE NOTE    CC:  F/u for surgery  HPI:  This is a 64 y.o. 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 Stanford Breed. She was seen on 08/15/2020 and at that time, she had a small hematoma at the incision and was advised to use warm compresses and keep follow up appt for u/s.  She did not have any hand pain.  She felt her left hand was cooler than the right since surgery.   She is here today for that visit.   Pt states she has occasional numbness in her left hand that comes and goes and feels that it is positional.  She does not have any motor issues or pain.  She continues to have a hematoma at the antecubital space.  She states this has not gotten bigger or smaller since her last visit.  She states that she had decreased hgb requiring a transfusion and was wondering if it could be related to her hematoma.   The pt IS on dialysis on M/W/F at the [facility] location.   Allergies  Allergen Reactions  . Codeine Nausea And Vomiting  . Adhesive [Tape] Other (See Comments)    Tape breaks out the skin if it is left on for a lengthy period of time    Current Outpatient Medications  Medication Sig Dispense Refill  . amLODipine (NORVASC) 10 MG tablet Take 1 tablet (10 mg total) by mouth daily. 30 tablet 0  . aspirin 325 MG tablet Take 1 tablet (325 mg total) by mouth daily. 90 tablet 1  . atorvastatin (LIPITOR) 40 MG tablet TAKE 1 TABLET BY MOUTH DAILY AT 6 PM. (Patient taking differently: Take 40 mg by mouth every evening.) 90 tablet 1  . Blood Glucose Monitoring Suppl (BLOOD GLUCOSE METER) kit Use as instructed 1 each 0  . Blood Glucose Monitoring Suppl (TRUE METRIX METER) DEVI 1 each by Does not apply route 3 (three) times daily before meals. 1 each 0  . calcium carbonate (OS-CAL - DOSED IN MG OF ELEMENTAL CALCIUM) 1250 (500 Ca) MG tablet Take 1 tablet (500 mg of elemental calcium total) by mouth 2 (two) times daily with a meal. 60 tablet 0  .  carvedilol (COREG) 12.5 MG tablet Take 1 tablet (12.5 mg total) by mouth 2 (two) times daily with a meal. 60 tablet 3  . DULoxetine (CYMBALTA) 60 MG capsule Take 1 capsule (60 mg total) by mouth daily. 90 capsule 1  . glucose blood (TRUETEST TEST) test strip Use as instructed 100 each 12  . hydrALAZINE (APRESOLINE) 25 MG tablet Take 1 tablet (25 mg total) by mouth every 8 (eight) hours. 90 tablet 0  . Lancets (FREESTYLE) lancets Use as instructed 100 each 12  . metoCLOPramide (REGLAN) 5 MG tablet Take 1 tablet (5 mg total) by mouth 2 (two) times daily before a meal. 60 tablet 0  . TRUEplus Lancets 28G MISC 1 each by Does not apply route 3 (three) times daily before meals. 100 each 12   No current facility-administered medications for this visit.     ROS:  See HPI  Physical Exam:  Today's Vitals   09/05/20 0943  BP: (!) 181/72  Pulse: 75  Resp: 20  Temp: 97.9 F (36.6 C)  TempSrc: Temporal  SpO2: 95%  Weight: 172 lb 4.8 oz (78.2 kg)  Height: 5' 2"  (1.575 m)  PainSc: 0-No pain   Body mass index is 31.51 kg/m.  Incision:  Well healed Extremities:   There is a palpable left radial pulse.   Motor and sensory are in tact.   There is a thrill present that is easily palpable She continues to have a moderate hematoma at the antecubital space. There is no pulse or thrill present in the hematoma.   Dialysis Duplex on 09/05/2020: Diameter:  0.55cm-0.74cm Depth:  0.72cm-1.45cm   Assessment/Plan:  This is a 64 y.o. female who is s/p: 1st stage BVT and insertion of TDC on 07/27/2020 by Dr. Stanford Breed.  -the pt does have intermittent numbness of her left hand.  Her motor and sensory are in tact and she does not have pain in the left hand.   -she continues to have a moderate side hematoma at the antecubital space that has not changed in size.  I reviewed the u/s with Dr. Stanford Breed and will give her another 6-8 weeks to let this resolve.  Will have her follow up with him at that time and  proceed with 2nd stage BVT as indicated.   -discussed with pt that this hematoma did not cause her hgb to drop.  She has not seen any blood in her stool or urine.  -I discussed this with pt and her son and she is in agreement.    Leontine Locket, [facility] [phone]  Clinic MD:  Stanford Breed

## 2020-09-07 ENCOUNTER — Other Ambulatory Visit: Payer: Self-pay | Admitting: Nephrology

## 2020-09-07 MED FILL — CALCIUM ACETATE (PHOS BINDE: 667 | 30 days supply | Qty: 150 | Fill #0

## 2020-09-08 DIAGNOSIS — Z992 Dependence on renal dialysis: Secondary | ICD-10-CM | POA: Diagnosis not present

## 2020-09-08 DIAGNOSIS — E1122 Type 2 diabetes mellitus with diabetic chronic kidney disease: Secondary | ICD-10-CM | POA: Diagnosis not present

## 2020-09-08 DIAGNOSIS — N186 End stage renal disease: Secondary | ICD-10-CM | POA: Diagnosis not present

## 2020-09-09 DIAGNOSIS — Z9289 Personal history of other medical treatment: Secondary | ICD-10-CM

## 2020-09-09 HISTORY — DX: Personal history of other medical treatment: Z92.89

## 2020-09-19 ENCOUNTER — Other Ambulatory Visit: Payer: Self-pay | Admitting: Nurse Practitioner

## 2020-09-19 MED FILL — OMEPRAZOLE 20 MG CAP: 20 | 14 days supply | Qty: 28 | Fill #0

## 2020-09-19 MED FILL — METOCLOPRAMIDE 5 MG TABLET: 5 | 30 days supply | Qty: 120 | Fill #0

## 2020-09-19 MED FILL — CALCIUM ACETATE (PHOS BINDE: 667 | 30 days supply | Qty: 150 | Fill #0

## 2020-09-27 DIAGNOSIS — N186 End stage renal disease: Secondary | ICD-10-CM | POA: Diagnosis not present

## 2020-09-27 DIAGNOSIS — E611 Iron deficiency: Secondary | ICD-10-CM | POA: Diagnosis not present

## 2020-09-27 DIAGNOSIS — E1122 Type 2 diabetes mellitus with diabetic chronic kidney disease: Secondary | ICD-10-CM | POA: Diagnosis not present

## 2020-10-02 ENCOUNTER — Other Ambulatory Visit: Payer: Self-pay | Admitting: Nephrology

## 2020-10-03 ENCOUNTER — Telehealth: Payer: Self-pay | Admitting: *Deleted

## 2020-10-03 MED FILL — rOPINIRole HCL 0.25 MG TABS: 0.25 | 30 days supply | Qty: 30 | Fill #0

## 2020-10-03 NOTE — Telephone Encounter (Signed)
Natalie,  This pt is cleared for anesthetic care at [facility].  Thanks,  Emmylou Bieker 

## 2020-10-03 NOTE — Telephone Encounter (Signed)
Tina Gomez, This patient has a history of CVA and End stage renal disease.  Could you please review to make sure she is appropriate for [facility]? Thank you,  Lanelle Bal

## 2020-10-09 ENCOUNTER — Other Ambulatory Visit: Payer: Self-pay

## 2020-10-09 ENCOUNTER — Ambulatory Visit: Payer: Self-pay

## 2020-10-09 VITALS — Ht 62.0 in | Wt 174.0 lb

## 2020-10-09 DIAGNOSIS — N186 End stage renal disease: Secondary | ICD-10-CM | POA: Diagnosis not present

## 2020-10-09 DIAGNOSIS — Z992 Dependence on renal dialysis: Secondary | ICD-10-CM | POA: Diagnosis not present

## 2020-10-09 DIAGNOSIS — E1122 Type 2 diabetes mellitus with diabetic chronic kidney disease: Secondary | ICD-10-CM | POA: Diagnosis not present

## 2020-10-13 ENCOUNTER — Other Ambulatory Visit (HOSPITAL_COMMUNITY): Payer: Self-pay | Admitting: Nephrology

## 2020-10-13 MED FILL — ROPINIROLE HCL 0.5 MG TABS: 0.5 | 30 days supply | Qty: 30 | Fill #0

## 2020-10-23 ENCOUNTER — Encounter: Payer: Self-pay | Admitting: Internal Medicine

## 2020-10-24 ENCOUNTER — Other Ambulatory Visit: Payer: Self-pay

## 2020-10-24 ENCOUNTER — Ambulatory Visit (INDEPENDENT_AMBULATORY_CARE_PROVIDER_SITE_OTHER): Payer: Medicaid Other | Admitting: Vascular Surgery

## 2020-10-24 ENCOUNTER — Encounter: Payer: Self-pay | Admitting: Vascular Surgery

## 2020-10-24 VITALS — BP 194/75 | HR 82 | Temp 97.5°F | Resp 20 | Ht 62.0 in | Wt 174.0 lb

## 2020-10-24 DIAGNOSIS — N186 End stage renal disease: Secondary | ICD-10-CM | POA: Diagnosis not present

## 2020-10-24 DIAGNOSIS — Z992 Dependence on renal dialysis: Secondary | ICD-10-CM | POA: Diagnosis not present

## 2020-10-24 NOTE — H&P (View-Only) (Signed)
ASSESSMENT & PLAN:  65 y.o. female with ESRD in need of permanent HD access. Status post left upper extremity first stage basilic vein transposition. She dialyzes MWF. Plan OR 11/02/20 for second stage basilic vein transposition.   CHIEF COMPLAINT:   HD access  HISTORY:  HISTORY OF PRESENT ILLNESS: Tina Gomez is a 65 y.o. female with end-stage renal disease for whom I performed a left first stage brachiobasilic AV fistula 2/70/6237.  This was complicated by hematoma which delayed her second stage surgery.  She now presents for evaluation.  Her hematoma has resolved.  She feels well.  She is dialyzing successfully through the right IJ catheter.  Her hand does feel cold sometimes on dialysis, but she is managing this well.  Past Medical History:  Diagnosis Date  . Chronic kidney disease   . Hypertension   . Stroke [facility])     Past Surgical History:  Procedure Laterality Date  . AV FISTULA PLACEMENT Left 07/27/2020   Procedure: LEFT BRACHIO-BASILIC ARTERIOVENOUS (AV) FISTULA CREATION;  Surgeon: Cherre Robins, MD;  Location: [place];  Service: Vascular;  Laterality: Left;  . CESAREAN SECTION    . ESOPHAGOGASTRODUODENOSCOPY (EGD) WITH PROPOFOL N/A 05/26/2020   Procedure: ESOPHAGOGASTRODUODENOSCOPY (EGD) WITH PROPOFOL;  Surgeon: Irene Shipper, MD;  Location: [facility] ENDOSCOPY;  Service: Endoscopy;  Laterality: N/A;  . INSERTION OF DIALYSIS CATHETER Right 07/27/2020   Procedure: INSERTION OF RIGHT INTERNAL JUGULAR TUNNELED DIALYSIS CATHETER;  Surgeon: Cherre Robins, MD;  Location: [place];  Service: Vascular;  Laterality: Right;  . IR FLUORO GUIDE CV LINE RIGHT  07/25/2020  . IR US GUIDE VASC ACCESS RIGHT  07/25/2020    Family History  Problem Relation Age of Onset  . Stroke Mother   . Hypertension Mother   . Hyperlipidemia Mother   . Hypertension Father   . Hyperlipidemia Father     Social History   Socioeconomic History  . Marital status: Divorced    Spouse name: Not on file   . Number of children: Not on file  . Years of education: Not on file  . Highest education level: Not on file  Occupational History  . Not on file  Tobacco Use  . Smoking status: Current Every Day Smoker    Packs/day: 0.25    Years: 10.00    Pack years: 2.50    Types: Cigarettes  . Smokeless tobacco: Never Used  . Tobacco comment: 5 cigs daily  Vaping Use  . Vaping Use: Never used  Substance and Sexual Activity  . Alcohol use: No  . Drug use: No  . Sexual activity: Not on file  Other Topics Concern  . Not on file  Social History Narrative  . Not on file   Social Determinants of Health   Financial Resource Strain: Not on file  Food Insecurity: Not on file  Transportation Needs: Not on file  Physical Activity: Not on file  Stress: Not on file  Social Connections: Not on file  Intimate Partner Violence: Not on file    Allergies  Allergen Reactions  . Codeine Nausea And Vomiting  . Adhesive [Tape] Other (See Comments)    Tape breaks out the skin if it is left on for a lengthy period of time    Current Outpatient Medications  Medication Sig Dispense Refill  . aspirin 325 MG tablet Take 1 tablet (325 mg total) by mouth daily. 90 tablet 1  . atorvastatin (LIPITOR) 40 MG tablet TAKE 1 TABLET BY  MOUTH DAILY AT 6 PM. (Patient taking differently: Take 40 mg by mouth every evening.) 90 tablet 1  . B Complex-C-Zn-Folic Acid (DIALYVITE 681 WITH ZINC) 0.8 MG TABS Take 1 tablet by mouth at bedtime.    . Blood Glucose Monitoring Suppl (BLOOD GLUCOSE METER) kit Use as instructed 1 each 0  . Blood Glucose Monitoring Suppl (TRUE METRIX METER) DEVI 1 each by Does not apply route 3 (three) times daily before meals. 1 each 0  . calcium carbonate (OS-CAL) 600 MG tablet TAKE 1 TABLET (500 MG OF ELEMENTAL CALCIUM TOTAL) BY MOUTH TWO TIMES DAILY WITH A MEAL.    . carvedilol (COREG) 12.5 MG tablet Take 1 tablet (12.5 mg total) by mouth 2 (two) times daily with a meal. 60 tablet 3  .  DULoxetine (CYMBALTA) 60 MG capsule Take 1 capsule (60 mg total) by mouth daily. 90 capsule 1  . glucose blood (TRUETEST TEST) test strip Use as instructed 100 each 12  . Lancets (FREESTYLE) lancets Use as instructed 100 each 12  . metoCLOPramide (REGLAN) 5 MG tablet TAKE 1 TABLET (5 MG TOTAL) BY MOUTH 4 (FOUR) TIMES DAILY - BEFORE MEALS AND AT BEDTIME. 120 tablet 0  . TRUEplus Lancets 28G MISC 1 each by Does not apply route 3 (three) times daily before meals. 100 each 12  . amLODipine (NORVASC) 10 MG tablet Take 1 tablet (10 mg total) by mouth daily. 30 tablet 0  . calcium carbonate (OS-CAL - DOSED IN MG OF ELEMENTAL CALCIUM) 1250 (500 Ca) MG tablet Take 1 tablet (500 mg of elemental calcium total) by mouth 2 (two) times daily with a meal. 60 tablet 0  . hydrALAZINE (APRESOLINE) 25 MG tablet Take 1 tablet (25 mg total) by mouth every 8 (eight) hours. 90 tablet 0   No current facility-administered medications for this visit.    REVIEW OF SYSTEMS:  _0  denotes positive finding, _1  denotes negative finding Cardiac  Comments:  Chest pain or chest pressure:    Shortness of breath upon exertion:    Short of breath when lying flat:    Irregular heart rhythm:        Vascular    Pain in calf, thigh, or hip brought on by ambulation:    Pain in feet at night that wakes you up from your sleep:     Blood clot in your veins:    Leg swelling:         Pulmonary    Oxygen at home:    Productive cough:     Wheezing:         Neurologic    Sudden weakness in arms or legs:     Sudden numbness in arms or legs:     Sudden onset of difficulty speaking or slurred speech:    Temporary loss of vision in one eye:     Problems with dizziness:         Gastrointestinal    Blood in stool:     Vomited blood:         Genitourinary    Burning when urinating:     Blood in urine:        Psychiatric    Major depression:         Hematologic    Bleeding problems:    Problems with blood clotting too  easily:        Skin    Rashes or ulcers:        Constitutional  Fever or chills:     PHYSICAL EXAM:   Vitals:   10/24/20 0919  BP: (!) 194/75  Pulse: 82  Resp: 20  Temp: (!) 97.5 F (36.4 C)  SpO2: 99%  Weight: 174 lb (78.9 kg)  Height: _0  (1.575 m)   Constitutional: Appears chronically ill, but in no distress. Appears well nourished.  Neurologic: CN intact. No focal findings. no sensory loss. Psychiatric: Mood and affect symmetric and appropriate. Eyes: No icterus. No conjunctival pallor. Ears, nose, throat: mucous membranes moist. Midline trachea.  Cardiac: Regular rate and rhythm.  Respiratory: unlabored. Abdominal: soft, non-tender, non-distended.  Peripheral vascular:  2+ L radial pulse  Strong thrill in LUE AVF  No evidence of hematoma on exam today Extremity: No edema. No cyanosis. No pallor.  Skin: No gangrene. No ulceration.  Lymphatic: No Stemmer's sign. No palpable lymphadenopathy.   DATA REVIEW:    Most recent CBC CBC Latest Ref Rng & Units 07/28/2020 07/27/2020 07/25/2020  WBC 4.0 - 10.5 K/uL 8.0 7.1 7.1  Hemoglobin 12.0 - 15.0 g/dL 8.1(L) 9.0(L) 9.2(L)  Hematocrit 36.0 - 46.0 % 26.9(L) 28.2(L) 27.5(L)  Platelets 150 - 400 K/uL 301 322 341     Most recent CMP CMP Latest Ref Rng & Units 07/31/2020 07/30/2020 07/29/2020  Glucose 70 - 99 mg/dL 138(H) 82 77  BUN 8 - 23 mg/dL 14 36(H) 28(H)  Creatinine 0.44 - 1.00 mg/dL 2.93(H) 4.67(H) 3.80(H)  Sodium 135 - 145 mmol/L 139 139 139  Potassium 3.5 - 5.1 mmol/L 3.3(L) 3.3(L) 3.2(L)  Chloride 98 - 111 mmol/L 99 101 100  CO2 22 - 32 mmol/L _1 Calcium 8.9 - 10.3 mg/dL 8.0(L) 8.0(L) 6.9(L)  Total Protein 6.5 - 8.1 g/dL - - -  Total Bilirubin 0.3 - 1.2 mg/dL - - -  Alkaline Phos 38 - 126 U/L - - -  AST 15 - 41 U/L - - -  ALT 0 - 44 U/L - - -    Renal function CrCl cannot be calculated (Patient's most recent lab result is older than the maximum 21 days allowed.).  HbA1c, POC (controlled  diabetic range) (%)  Date Value  05/23/2020 7.6 (A)   Hgb A1c MFr Bld (%)  Date Value  05/25/2020 7.9 (H)    LDL Calculated  Date Value Ref Range Status  11/25/2018 153 (H) 0 - 99 mg/dL Final     DIALYSIS ACCESS   Reason for Exam: Routine follow up.   Access Site: Left Upper Extremity.   Access Type: Basilic vein transposition.   History: Left arm AVF created 07/27/2020. Patient has a palpable mass at  left      distal upper arm since the AVF creation.   Performing Technologist: Delorise Shiner RVT     Examination Guidelines: A complete evaluation includes B-mode imaging,  spectral  Doppler, color Doppler, and power Doppler as needed of all accessible  portions  of each vessel. Unilateral testing is considered an integral part of a  complete  examination. Limited examinations for reoccurring indications may be  performed  as noted.     Findings:  +--------------------+----------+-----------------+--------+  AVF         PSV (cm/s)Flow Vol (mL/min)Comments  +--------------------+----------+-----------------+--------+  Native artery inflow  272     1969          +--------------------+----------+-----------------+--------+  AVF Anastomosis     688                 +--------------------+----------+-----------------+--------+     +------------+----------+-------------+----------+---------+  OUTFLOW VEINPSV (cm/s)Diameter (cm)Depth (cm)Describe   +------------+----------+-------------+----------+---------+  Prox UA     79    0.74     0.99         +------------+----------+-------------+----------+---------+  Mid UA     121    0.56     1.45         +------------+----------+-------------+----------+---------+  Dist UA     115    0.68     0.91         +------------+----------+-------------+----------+---------+  AC Fossa    114     0.55     0.72  Two veins  +------------+----------+-------------+----------+---------+  There is a second basilic branch that courses posteriorly around the upper  arm. 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       Summary:  Patent arteriovenous fistula.  Bifid basilic vein and large hematoma as described above.    *See table(s) above for measurements and observations.    [name]. Stanford Breed, MD [facility] Phone Number: [phone] 10/24/2020 9:34 AM

## 2020-10-24 NOTE — Progress Notes (Signed)
ASSESSMENT & PLAN:  65 y.o. female with ESRD in need of permanent HD access. Status post left upper extremity first stage basilic vein transposition. She dialyzes MWF. Plan OR 11/02/20 for second stage basilic vein transposition.   CHIEF COMPLAINT:   HD access  HISTORY:  HISTORY OF PRESENT ILLNESS: Tina Gomez is a 65 y.o. female with end-stage renal disease for whom I performed a left first stage brachiobasilic AV fistula 2/70/6237.  This was complicated by hematoma which delayed her second stage surgery.  She now presents for evaluation.  Her hematoma has resolved.  She feels well.  She is dialyzing successfully through the right IJ catheter.  Her hand does feel cold sometimes on dialysis, but she is managing this well.  Past Medical History:  Diagnosis Date  . Chronic kidney disease   . Hypertension   . Stroke [facility])     Past Surgical History:  Procedure Laterality Date  . AV FISTULA PLACEMENT Left 07/27/2020   Procedure: LEFT BRACHIO-BASILIC ARTERIOVENOUS (AV) FISTULA CREATION;  Surgeon: Cherre Robins, MD;  Location: [place];  Service: Vascular;  Laterality: Left;  . CESAREAN SECTION    . ESOPHAGOGASTRODUODENOSCOPY (EGD) WITH PROPOFOL N/A 05/26/2020   Procedure: ESOPHAGOGASTRODUODENOSCOPY (EGD) WITH PROPOFOL;  Surgeon: Irene Shipper, MD;  Location: [facility] ENDOSCOPY;  Service: Endoscopy;  Laterality: N/A;  . INSERTION OF DIALYSIS CATHETER Right 07/27/2020   Procedure: INSERTION OF RIGHT INTERNAL JUGULAR TUNNELED DIALYSIS CATHETER;  Surgeon: Cherre Robins, MD;  Location: [place];  Service: Vascular;  Laterality: Right;  . IR FLUORO GUIDE CV LINE RIGHT  07/25/2020  . IR US GUIDE VASC ACCESS RIGHT  07/25/2020    Family History  Problem Relation Age of Onset  . Stroke Mother   . Hypertension Mother   . Hyperlipidemia Mother   . Hypertension Father   . Hyperlipidemia Father     Social History   Socioeconomic History  . Marital status: Divorced    Spouse name: Not on file   . Number of children: Not on file  . Years of education: Not on file  . Highest education level: Not on file  Occupational History  . Not on file  Tobacco Use  . Smoking status: Current Every Day Smoker    Packs/day: 0.25    Years: 10.00    Pack years: 2.50    Types: Cigarettes  . Smokeless tobacco: Never Used  . Tobacco comment: 5 cigs daily  Vaping Use  . Vaping Use: Never used  Substance and Sexual Activity  . Alcohol use: No  . Drug use: No  . Sexual activity: Not on file  Other Topics Concern  . Not on file  Social History Narrative  . Not on file   Social Determinants of Health   Financial Resource Strain: Not on file  Food Insecurity: Not on file  Transportation Needs: Not on file  Physical Activity: Not on file  Stress: Not on file  Social Connections: Not on file  Intimate Partner Violence: Not on file    Allergies  Allergen Reactions  . Codeine Nausea And Vomiting  . Adhesive [Tape] Other (See Comments)    Tape breaks out the skin if it is left on for a lengthy period of time    Current Outpatient Medications  Medication Sig Dispense Refill  . aspirin 325 MG tablet Take 1 tablet (325 mg total) by mouth daily. 90 tablet 1  . atorvastatin (LIPITOR) 40 MG tablet TAKE 1 TABLET BY  MOUTH DAILY AT 6 PM. (Patient taking differently: Take 40 mg by mouth every evening.) 90 tablet 1  . B Complex-C-Zn-Folic Acid (DIALYVITE 681 WITH ZINC) 0.8 MG TABS Take 1 tablet by mouth at bedtime.    . Blood Glucose Monitoring Suppl (BLOOD GLUCOSE METER) kit Use as instructed 1 each 0  . Blood Glucose Monitoring Suppl (TRUE METRIX METER) DEVI 1 each by Does not apply route 3 (three) times daily before meals. 1 each 0  . calcium carbonate (OS-CAL) 600 MG tablet TAKE 1 TABLET (500 MG OF ELEMENTAL CALCIUM TOTAL) BY MOUTH TWO TIMES DAILY WITH A MEAL.    . carvedilol (COREG) 12.5 MG tablet Take 1 tablet (12.5 mg total) by mouth 2 (two) times daily with a meal. 60 tablet 3  .  DULoxetine (CYMBALTA) 60 MG capsule Take 1 capsule (60 mg total) by mouth daily. 90 capsule 1  . glucose blood (TRUETEST TEST) test strip Use as instructed 100 each 12  . Lancets (FREESTYLE) lancets Use as instructed 100 each 12  . metoCLOPramide (REGLAN) 5 MG tablet TAKE 1 TABLET (5 MG TOTAL) BY MOUTH 4 (FOUR) TIMES DAILY - BEFORE MEALS AND AT BEDTIME. 120 tablet 0  . TRUEplus Lancets 28G MISC 1 each by Does not apply route 3 (three) times daily before meals. 100 each 12  . amLODipine (NORVASC) 10 MG tablet Take 1 tablet (10 mg total) by mouth daily. 30 tablet 0  . calcium carbonate (OS-CAL - DOSED IN MG OF ELEMENTAL CALCIUM) 1250 (500 Ca) MG tablet Take 1 tablet (500 mg of elemental calcium total) by mouth 2 (two) times daily with a meal. 60 tablet 0  . hydrALAZINE (APRESOLINE) 25 MG tablet Take 1 tablet (25 mg total) by mouth every 8 (eight) hours. 90 tablet 0   No current facility-administered medications for this visit.    REVIEW OF SYSTEMS:  _0  denotes positive finding, _1  denotes negative finding Cardiac  Comments:  Chest pain or chest pressure:    Shortness of breath upon exertion:    Short of breath when lying flat:    Irregular heart rhythm:        Vascular    Pain in calf, thigh, or hip brought on by ambulation:    Pain in feet at night that wakes you up from your sleep:     Blood clot in your veins:    Leg swelling:         Pulmonary    Oxygen at home:    Productive cough:     Wheezing:         Neurologic    Sudden weakness in arms or legs:     Sudden numbness in arms or legs:     Sudden onset of difficulty speaking or slurred speech:    Temporary loss of vision in one eye:     Problems with dizziness:         Gastrointestinal    Blood in stool:     Vomited blood:         Genitourinary    Burning when urinating:     Blood in urine:        Psychiatric    Major depression:         Hematologic    Bleeding problems:    Problems with blood clotting too  easily:        Skin    Rashes or ulcers:        Constitutional  Fever or chills:     PHYSICAL EXAM:   Vitals:   10/24/20 0919  BP: (!) 194/75  Pulse: 82  Resp: 20  Temp: (!) 97.5 F (36.4 C)  SpO2: 99%  Weight: 174 lb (78.9 kg)  Height: _0  (1.575 m)   Constitutional: Appears chronically ill, but in no distress. Appears well nourished.  Neurologic: CN intact. No focal findings. no sensory loss. Psychiatric: Mood and affect symmetric and appropriate. Eyes: No icterus. No conjunctival pallor. Ears, nose, throat: mucous membranes moist. Midline trachea.  Cardiac: Regular rate and rhythm.  Respiratory: unlabored. Abdominal: soft, non-tender, non-distended.  Peripheral vascular:  2+ L radial pulse  Strong thrill in LUE AVF  No evidence of hematoma on exam today Extremity: No edema. No cyanosis. No pallor.  Skin: No gangrene. No ulceration.  Lymphatic: No Stemmer's sign. No palpable lymphadenopathy.   DATA REVIEW:    Most recent CBC CBC Latest Ref Rng & Units 07/28/2020 07/27/2020 07/25/2020  WBC 4.0 - 10.5 K/uL 8.0 7.1 7.1  Hemoglobin 12.0 - 15.0 g/dL 8.1(L) 9.0(L) 9.2(L)  Hematocrit 36.0 - 46.0 % 26.9(L) 28.2(L) 27.5(L)  Platelets 150 - 400 K/uL 301 322 341     Most recent CMP CMP Latest Ref Rng & Units 07/31/2020 07/30/2020 07/29/2020  Glucose 70 - 99 mg/dL 138(H) 82 77  BUN 8 - 23 mg/dL 14 36(H) 28(H)  Creatinine 0.44 - 1.00 mg/dL 2.93(H) 4.67(H) 3.80(H)  Sodium 135 - 145 mmol/L 139 139 139  Potassium 3.5 - 5.1 mmol/L 3.3(L) 3.3(L) 3.2(L)  Chloride 98 - 111 mmol/L 99 101 100  CO2 22 - 32 mmol/L _1 Calcium 8.9 - 10.3 mg/dL 8.0(L) 8.0(L) 6.9(L)  Total Protein 6.5 - 8.1 g/dL - - -  Total Bilirubin 0.3 - 1.2 mg/dL - - -  Alkaline Phos 38 - 126 U/L - - -  AST 15 - 41 U/L - - -  ALT 0 - 44 U/L - - -    Renal function CrCl cannot be calculated (Patient's most recent lab result is older than the maximum 21 days allowed.).  HbA1c, POC (controlled  diabetic range) (%)  Date Value  05/23/2020 7.6 (A)   Hgb A1c MFr Bld (%)  Date Value  05/25/2020 7.9 (H)    LDL Calculated  Date Value Ref Range Status  11/25/2018 153 (H) 0 - 99 mg/dL Final     DIALYSIS ACCESS   Reason for Exam: Routine follow up.   Access Site: Left Upper Extremity.   Access Type: Basilic vein transposition.   History: Left arm AVF created 07/27/2020. Patient has a palpable mass at  left      distal upper arm since the AVF creation.   Performing Technologist: Delorise Shiner RVT     Examination Guidelines: A complete evaluation includes B-mode imaging,  spectral  Doppler, color Doppler, and power Doppler as needed of all accessible  portions  of each vessel. Unilateral testing is considered an integral part of a  complete  examination. Limited examinations for reoccurring indications may be  performed  as noted.     Findings:  +--------------------+----------+-----------------+--------+  AVF         PSV (cm/s)Flow Vol (mL/min)Comments  +--------------------+----------+-----------------+--------+  Native artery inflow  272     1969          +--------------------+----------+-----------------+--------+  AVF Anastomosis     688                 +--------------------+----------+-----------------+--------+     +------------+----------+-------------+----------+---------+  OUTFLOW VEINPSV (cm/s)Diameter (cm)Depth (cm)Describe   +------------+----------+-------------+----------+---------+  Prox UA     79    0.74     0.99         +------------+----------+-------------+----------+---------+  Mid UA     121    0.56     1.45         +------------+----------+-------------+----------+---------+  Dist UA     115    0.68     0.91         +------------+----------+-------------+----------+---------+  AC Fossa    114     0.55     0.72  Two veins  +------------+----------+-------------+----------+---------+  There is a second basilic branch that courses posteriorly around the upper  arm. 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       Summary:  Patent arteriovenous fistula.  Bifid basilic vein and large hematoma as described above.    *See table(s) above for measurements and observations.    [name]. Stanford Breed, MD [facility] Phone Number: [phone] 10/24/2020 9:34 AM

## 2020-10-25 ENCOUNTER — Other Ambulatory Visit (HOSPITAL_COMMUNITY): Payer: Self-pay | Admitting: Nephrology

## 2020-10-25 DIAGNOSIS — E611 Iron deficiency: Secondary | ICD-10-CM | POA: Diagnosis not present

## 2020-10-25 DIAGNOSIS — N186 End stage renal disease: Secondary | ICD-10-CM | POA: Diagnosis not present

## 2020-10-25 MED FILL — rOPINIRole HCL 2 MG TABS: 2 | 90 days supply | Qty: 90 | Fill #0

## 2020-10-31 ENCOUNTER — Other Ambulatory Visit (HOSPITAL_COMMUNITY)
Admission: RE | Admit: 2020-10-31 | Discharge: 2020-10-31 | Disposition: A | Discharge status: Home / Self Care | Payer: Medicaid Other | Source: Ambulatory Visit | Attending: Vascular Surgery | Admitting: Vascular Surgery

## 2020-10-31 DIAGNOSIS — Z01812 Encounter for preprocedural laboratory examination: Secondary | ICD-10-CM | POA: Insufficient documentation

## 2020-10-31 DIAGNOSIS — Z20822 Contact with and (suspected) exposure to covid-19: Secondary | ICD-10-CM | POA: Insufficient documentation

## 2020-10-31 LAB — SARS CORONAVIRUS 2 (TAT 6-24 HRS): SARS Coronavirus 2: NEGATIVE

## 2020-11-01 ENCOUNTER — Encounter (HOSPITAL_COMMUNITY): Payer: Self-pay | Admitting: *Deleted

## 2020-11-01 ENCOUNTER — Other Ambulatory Visit: Payer: Self-pay

## 2020-11-01 DIAGNOSIS — E611 Iron deficiency: Secondary | ICD-10-CM | POA: Diagnosis not present

## 2020-11-01 NOTE — Progress Notes (Signed)
PCP - Dr Charlott Rakes Cardiologist - n/a  Chest x-ray - 07/27/20 (1V) EKG - 07/27/20 Stress Test - n/a ECHO - 10/23/13 Cardiac Cath - n/a  Fasting Blood Sugar - 80-100s Checks Blood Sugar 3 times a day DM type 2 - diet controlled, no meds  . If your blood sugar is less than 70 mg/dL, you will need to treat for low blood sugar: o Treat a low blood sugar (less than 70 mg/dL) with  cup of clear juice (cranberry or apple), 4 glucose tablets, OR glucose gel. o Recheck blood sugar in 15 minutes after treatment (to make sure it is greater than 70 mg/dL). If your blood sugar is not greater than 70 mg/dL on recheck, call [phone] for further instructions.  Anesthesia review: Yes  STOP now taking any Aspirin (unless otherwise instructed by your surgeon), Aleve, Naproxen, Ibuprofen, Motrin, Advil, Goody's, BC's, all herbal medications, fish oil, and all vitamins.   Coronavirus Screening Covid test on 10/31/20 was negative.  Do you have any of the following symptoms:  Cough yes/no: No Fever (>100.52F)  yes/no: No Runny nose yes/no: No Sore throat yes/no: No Difficulty breathing/shortness of breath  yes/no: No  Have you traveled in the last 14 days and where? yes/no: No  Patient verbalized understanding of instructions that were given via phone.

## 2020-11-01 NOTE — Anesthesia Preprocedure Evaluation (Addendum)
Anesthesia Evaluation  Patient identified by MRN, date of birth, ID band Patient awake    Reviewed: Allergy & Precautions, H&P , NPO status , Patient's Chart, lab work & pertinent test results, reviewed documented beta blocker date and time   History of Anesthesia Complications (+) PONV  Airway Mallampati: II  TM Distance: >3 FB Neck ROM: Full    Dental no notable dental hx. (+) Edentulous Upper, Edentulous Lower, Dental Advisory Given   Pulmonary COPD, Current Smoker and Patient abstained from smoking.,    Pulmonary exam normal breath sounds clear to auscultation       Cardiovascular hypertension, Pt. on medications and Pt. on home beta blockers  Rhythm:Regular Rate:Normal     Neuro/Psych CVA negative neurological ROS  negative psych ROS   GI/Hepatic Neg liver ROS, GERD  ,  Endo/Other  diabetes  Renal/GU ESRF and DialysisRenal disease  negative genitourinary   Musculoskeletal   Abdominal   Peds  Hematology  (+) Blood dyscrasia, anemia ,   Anesthesia Other Findings   Reproductive/Obstetrics negative OB ROS                           Anesthesia Physical Anesthesia Plan  ASA: III  Anesthesia Plan: MAC and Regional   Post-op Pain Management:    Induction: Intravenous  PONV Risk Score and Plan: 2 and Propofol infusion, Midazolam and Ondansetron  Airway Management Planned: Simple Face Mask  Additional Equipment:   Intra-op Plan:   Post-operative Plan:   Informed Consent: I have reviewed the patients History and Physical, chart, labs and discussed the procedure including the risks, benefits and alternatives for the proposed anesthesia with the patient or authorized representative who has indicated his/her understanding and acceptance.     Dental advisory given  Plan Discussed with: CRNA  Anesthesia Plan Comments: (PAT note written 11/01/2020 by Myra Gianotti, PA-C. )        Anesthesia Quick Evaluation

## 2020-11-01 NOTE — Progress Notes (Addendum)
Anesthesia Chart Review: SAME DAY WORK-UP   Case: [id number] Date/Time: 11/02/20 1200   Procedure: LEFT UPPER EXTREMITY ARTERIOVENOUS FISTULA REVISON (Left ) - PERIPHERAL NERVE BLOCK   Anesthesia type: Monitor Anesthesia Care   Pre-op diagnosis: ESRD   Location: [facility] OR ROOM 16 / [place] OR   Surgeons: Cherre Robins, MD      DISCUSSION: Patient is a 65 year old female scheduled for the above procedure. S/p right IJ tunneled dialysis catheter and first stage left brachio-basilic AVF creation on 33/82/50 complicated by hematoma which delayed second stage.   History includes smoking, post-operative N/V, HTN, CVA (10/22/13, s/p tPA, right hemiparesis), HLD, COPD, DM2, anemia, RLS, ESRD (started HD ~ 07/28/20). She has seen GI for gastroparesis. Prolonged QT noted on EKG during 07/2020 hospitalization for acute on chronic kidney disease with progression to ESRD.    Patient to continue ASA per [facility].  10/31/20 presurgical COVID-19 test negative. Anesthesia team to evaluate on the day of surgery.    VS: Ht 5' 2"  (1.575 m)   Wt 77.1 kg   LMP  (LMP Unknown)   BMI 31.09 kg/m  BP Readings from Last 3 Encounters:  10/24/20 (!) 194/75  09/05/20 (!) 181/72  08/22/20 (!) 160/78   Pulse Readings from Last 3 Encounters:  10/24/20 82  09/05/20 75  08/22/20 82     PROVIDERS: Charlott Rakes, MD is PCP    LABS: For day of surgery. Comparison labs include: Lab Results  Component Value Date   WBC 8.0 07/28/2020   HGB 8.1 (L) 07/28/2020   HCT 26.9 (L) 07/28/2020   PLT 301 07/28/2020   GLUCOSE 138 (H) 07/31/2020   ALT 6 07/23/2020   AST 8 (L) 07/23/2020   NA 139 07/31/2020   K 3.3 (L) 07/31/2020   CL 99 07/31/2020   CREATININE 2.93 (H) 07/31/2020   BUN 14 07/31/2020   CO2 28 07/31/2020   TSH 3.049 05/25/2020   HGBA1C 7.9 (H) 05/25/2020     IMAGES: 1V PCXR 07/27/20 (post-TDC insertion): IMPRESSION: Central catheter tip in superior vena cava without pneumothorax. Scarring in each upper  lobe, stable. No edema or airspace opacity. Heart is slightly enlarged with pulmonary vascularity normal.   EKG: 07/27/20: Ventricular rate 55 bpm Sinus bradycardia with 1st degree A-V block Prolonged QT [QT 528, QTc 505 ms] Abnormal ECG No significant change since last tracing Confirmed by Cristopher Peru [phone]) on 07/27/2020 10:39:06 PM   CV: Echo 10/23/13: Study Conclusions  - Procedure narrative: Transthoracic echocardiography. Image  quality was suboptimal, due to poor sound transmission.  - Left ventricle: Mild to moderate concentric left  ventricular hypertrophy. The cavity size was at the upper  limits of normal. Systolic function was normal. The  estimated ejection fraction was in the range of 60% to  65%. Wall motion was normal; there were no regional wall  motion abnormalities. Doppler parameters are consistent  with abnormal left ventricular relaxation (grade 1  diastolic dysfunction). Doppler parameters are consistent  with high ventricular filling pressure.  - Left atrium: The atrium was mildly dilated.  - Atrial septum: No defect or patent foramen ovale was  identified.  - Systemic veins: IVC dilated with normal respirophasic  variation, estimated CVP 8 mmHg.   Carotid US 10/23/13: Summary:  - The vertebral arteries appear patent with antegrade flow.  - Findings consistent with 1-39 percent stenosis involving  the right internal carotid artery and the left internal  carotid artery.    Past Medical History:  Diagnosis Date  . Anemia   . Chronic kidney disease   . COPD (chronic obstructive pulmonary disease) ([place]) 05/2019   no inhaler  . Diabetes mellitus without complication ([place])    no meds - diet controlled  . History of blood transfusion 09/2020   1 unit  . HLD (hyperlipidemia)   . Hypertension   . PONV (postoperative nausea and vomiting)   . Restless legs syndrome (RLS)   . Stroke [facility])     Past Surgical History:   Procedure Laterality Date  . AV FISTULA PLACEMENT Left 07/27/2020   Procedure: LEFT BRACHIO-BASILIC ARTERIOVENOUS (AV) FISTULA CREATION;  Surgeon: Cherre Robins, MD;  Location: [place];  Service: Vascular;  Laterality: Left;  . CESAREAN SECTION     x 1  . ESOPHAGOGASTRODUODENOSCOPY (EGD) WITH PROPOFOL N/A 05/26/2020   Procedure: ESOPHAGOGASTRODUODENOSCOPY (EGD) WITH PROPOFOL;  Surgeon: Irene Shipper, MD;  Location: [facility] ENDOSCOPY;  Service: Endoscopy;  Laterality: N/A;  . INSERTION OF DIALYSIS CATHETER Right 07/27/2020   Procedure: INSERTION OF RIGHT INTERNAL JUGULAR TUNNELED DIALYSIS CATHETER;  Surgeon: Cherre Robins, MD;  Location: [place];  Service: Vascular;  Laterality: Right;  . IR FLUORO GUIDE CV LINE RIGHT  07/25/2020  . IR US GUIDE VASC ACCESS RIGHT  07/25/2020  . TONSILLECTOMY    . UPPER GI ENDOSCOPY     growth removed from voice box  . WRIST SURGERY Left    ganglion cyst removal    MEDICATIONS: No current facility-administered medications for this encounter.   Marland Kitchen amLODipine (NORVASC) 10 MG tablet  . aspirin 325 MG tablet  . atorvastatin (LIPITOR) 40 MG tablet  . B Complex-C-Zn-Folic Acid (DIALYVITE 790 WITH ZINC) 0.8 MG TABS  . calcium acetate (PHOSLO) 667 MG capsule  . carvedilol (COREG) 12.5 MG tablet  . hydrALAZINE (APRESOLINE) 25 MG tablet  . metoCLOPramide (REGLAN) 5 MG tablet  . rOPINIRole (REQUIP) 2 MG tablet  . Blood Glucose Monitoring Suppl (BLOOD GLUCOSE METER) kit  . Blood Glucose Monitoring Suppl (TRUE METRIX METER) DEVI  . calcium carbonate (OS-CAL - DOSED IN MG OF ELEMENTAL CALCIUM) 1250 (500 Ca) MG tablet  . calcium carbonate (OS-CAL) 600 MG tablet  . DULoxetine (CYMBALTA) 60 MG capsule  . glucose blood (TRUETEST TEST) test strip  . Lancets (FREESTYLE) lancets  . TRUEplus Lancets 28G MISC    Myra Gianotti, PA-C Surgical Short Stay/Anesthesiology [facility] Phone [phone] [facility] Phone [phone] 11/01/2020 1:40 PM

## 2020-11-02 ENCOUNTER — Ambulatory Visit (HOSPITAL_COMMUNITY)
Admission: RE | Admit: 2020-11-02 | Discharge: 2020-11-02 | Disposition: A | Discharge status: Home / Self Care | Payer: Medicaid Other | Attending: Vascular Surgery | Admitting: Vascular Surgery

## 2020-11-02 ENCOUNTER — Ambulatory Visit (HOSPITAL_COMMUNITY): Payer: Medicaid Other | Admitting: Vascular Surgery

## 2020-11-02 ENCOUNTER — Other Ambulatory Visit: Payer: Self-pay

## 2020-11-02 ENCOUNTER — Encounter (HOSPITAL_COMMUNITY)
Admission: RE | Disposition: A | Discharge status: Home / Self Care | Payer: Self-pay | Source: Home / Self Care | Attending: Vascular Surgery

## 2020-11-02 ENCOUNTER — Encounter (HOSPITAL_COMMUNITY): Payer: Self-pay

## 2020-11-02 DIAGNOSIS — E1122 Type 2 diabetes mellitus with diabetic chronic kidney disease: Secondary | ICD-10-CM | POA: Diagnosis not present

## 2020-11-02 DIAGNOSIS — N179 Acute kidney failure, unspecified: Secondary | ICD-10-CM | POA: Diagnosis not present

## 2020-11-02 DIAGNOSIS — N186 End stage renal disease: Secondary | ICD-10-CM | POA: Insufficient documentation

## 2020-11-02 DIAGNOSIS — Z7982 Long term (current) use of aspirin: Secondary | ICD-10-CM | POA: Diagnosis not present

## 2020-11-02 DIAGNOSIS — F17210 Nicotine dependence, cigarettes, uncomplicated: Secondary | ICD-10-CM | POA: Insufficient documentation

## 2020-11-02 DIAGNOSIS — N185 Chronic kidney disease, stage 5: Secondary | ICD-10-CM | POA: Diagnosis not present

## 2020-11-02 DIAGNOSIS — Z79899 Other long term (current) drug therapy: Secondary | ICD-10-CM | POA: Diagnosis not present

## 2020-11-02 DIAGNOSIS — D631 Anemia in chronic kidney disease: Secondary | ICD-10-CM | POA: Diagnosis not present

## 2020-11-02 DIAGNOSIS — I120 Hypertensive chronic kidney disease with stage 5 chronic kidney disease or end stage renal disease: Secondary | ICD-10-CM | POA: Insufficient documentation

## 2020-11-02 DIAGNOSIS — Z992 Dependence on renal dialysis: Secondary | ICD-10-CM | POA: Diagnosis not present

## 2020-11-02 HISTORY — DX: Hyperlipidemia, unspecified: E78.5

## 2020-11-02 HISTORY — DX: Other specified postprocedural states: Z98.890

## 2020-11-02 HISTORY — DX: Anemia, unspecified: D64.9

## 2020-11-02 HISTORY — DX: Type 2 diabetes mellitus without complications: E11.9

## 2020-11-02 HISTORY — PX: BASCILIC VEIN TRANSPOSITION: SHX5742

## 2020-11-02 HISTORY — DX: Nausea with vomiting, unspecified: R11.2

## 2020-11-02 HISTORY — DX: Restless legs syndrome: G25.81

## 2020-11-02 LAB — POCT I-STAT, CHEM 8
BUN: 26 mg/dL — ABNORMAL HIGH (ref 8–23)
Calcium, Ion: 1.04 mmol/L — ABNORMAL LOW (ref 1.15–1.40)
Chloride: 98 mmol/L (ref 98–111)
Creatinine, Ser: 4.5 mg/dL — ABNORMAL HIGH (ref 0.44–1.00)
Glucose, Bld: 100 mg/dL — ABNORMAL HIGH (ref 70–99)
HCT: 25 % — ABNORMAL LOW (ref 36.0–46.0)
Hemoglobin: 8.5 g/dL — ABNORMAL LOW (ref 12.0–15.0)
Potassium: 3.6 mmol/L (ref 3.5–5.1)
Sodium: 139 mmol/L (ref 135–145)
TCO2: 28 mmol/L (ref 22–32)

## 2020-11-02 LAB — GLUCOSE, CAPILLARY
Glucose-Capillary: 105 mg/dL — ABNORMAL HIGH (ref 70–99)
Glucose-Capillary: 122 mg/dL — ABNORMAL HIGH (ref 70–99)
Glucose-Capillary: 90 mg/dL (ref 70–99)

## 2020-11-02 SURGERY — TRANSPOSITION, VEIN, BASILIC
Anesthesia: Monitor Anesthesia Care | Laterality: Left

## 2020-11-02 MED ORDER — LIDOCAINE-EPINEPHRINE (PF) 1.5 %-1:200000 IJ SOLN
INTRAMUSCULAR | Status: DC | PRN
  Administered 2020-11-02: 20 mL via PERINEURAL

## 2020-11-02 MED ORDER — FENTANYL CITRATE (PF) 100 MCG/2ML IJ SOLN
25.0000 ug | INTRAMUSCULAR | Status: DC | PRN
  Administered 2020-11-02: 25 ug via INTRAVENOUS

## 2020-11-02 MED ORDER — FENTANYL CITRATE (PF) 250 MCG/5ML IJ SOLN
INTRAMUSCULAR | Status: AC
  Filled 2020-11-02: qty 5

## 2020-11-02 MED ORDER — METOCLOPRAMIDE HCL 5 MG/ML IJ SOLN
10.0000 mg | Freq: Once | INTRAMUSCULAR | Status: AC
  Administered 2020-11-02: 10 mg via INTRAVENOUS

## 2020-11-02 MED ORDER — MIDAZOLAM HCL 2 MG/2ML IJ SOLN
INTRAMUSCULAR | Status: AC
  Administered 2020-11-02: 1 mg via INTRAVENOUS
  Filled 2020-11-02: qty 2

## 2020-11-02 MED ORDER — BUPIVACAINE HCL (PF) 0.25 % IJ SOLN
INTRAMUSCULAR | Status: DC | PRN
  Administered 2020-11-02: 8 mL

## 2020-11-02 MED ORDER — FENTANYL CITRATE (PF) 100 MCG/2ML IJ SOLN
INTRAMUSCULAR | Status: AC
  Administered 2020-11-02: 50 ug via INTRAVENOUS
  Filled 2020-11-02: qty 2

## 2020-11-02 MED ORDER — CEFAZOLIN SODIUM-DEXTROSE 2-4 GM/100ML-% IV SOLN
2.0000 g | INTRAVENOUS | Status: AC
  Administered 2020-11-02: 2 g via INTRAVENOUS
  Filled 2020-11-02: qty 100

## 2020-11-02 MED ORDER — CHLORHEXIDINE GLUCONATE 0.12 % MT SOLN
OROMUCOSAL | Status: AC
  Administered 2020-11-02: 15 mL via OROMUCOSAL
  Filled 2020-11-02: qty 15

## 2020-11-02 MED ORDER — CHLORHEXIDINE GLUCONATE 0.12 % MT SOLN: 15.0000 mL | Freq: Once | OROMUCOSAL | Status: AC

## 2020-11-02 MED ORDER — BUPIVACAINE HCL (PF) 0.25 % IJ SOLN
INTRAMUSCULAR | Status: DC | PRN
  Administered 2020-11-02: 9 mL

## 2020-11-02 MED ORDER — SODIUM CHLORIDE 0.9 % IV SOLN: INTRAVENOUS | Status: DC

## 2020-11-02 MED ORDER — MIDAZOLAM HCL 2 MG/2ML IJ SOLN: 1.0000 mg | Freq: Once | INTRAMUSCULAR | Status: AC

## 2020-11-02 MED ORDER — FENTANYL CITRATE (PF) 100 MCG/2ML IJ SOLN
INTRAMUSCULAR | Status: DC | PRN
  Administered 2020-11-02: 25 ug via INTRAVENOUS

## 2020-11-02 MED ORDER — ACETAMINOPHEN 500 MG PO TABS: 1000.0000 mg | ORAL_TABLET | Freq: Once | ORAL | Status: DC

## 2020-11-02 MED ORDER — SODIUM CHLORIDE 0.9 % IV SOLN
INTRAVENOUS | Status: AC
  Filled 2020-11-02: qty 1.2

## 2020-11-02 MED ORDER — HEPARIN SODIUM (PORCINE) 1000 UNIT/ML IJ SOLN
INTRAMUSCULAR | Status: DC | PRN
  Administered 2020-11-02: 5000 [IU] via INTRAVENOUS

## 2020-11-02 MED ORDER — CHLORHEXIDINE GLUCONATE 4 % EX LIQD: 60.0000 mL | Freq: Once | CUTANEOUS | Status: DC

## 2020-11-02 MED ORDER — FENTANYL CITRATE (PF) 100 MCG/2ML IJ SOLN: 50.0000 ug | Freq: Once | INTRAMUSCULAR | Status: AC

## 2020-11-02 MED ORDER — PROPOFOL 500 MG/50ML IV EMUL
INTRAVENOUS | Status: DC | PRN
  Administered 2020-11-02: 75 ug/kg/min via INTRAVENOUS

## 2020-11-02 MED ORDER — PROTAMINE SULFATE 10 MG/ML IV SOLN
INTRAVENOUS | Status: DC | PRN
  Administered 2020-11-02: 40 mg via INTRAVENOUS

## 2020-11-02 MED ORDER — FENTANYL CITRATE (PF) 100 MCG/2ML IJ SOLN
INTRAMUSCULAR | Status: AC
  Filled 2020-11-02: qty 2

## 2020-11-02 MED ORDER — 0.9 % SODIUM CHLORIDE (POUR BTL) OPTIME
TOPICAL | Status: DC | PRN
  Administered 2020-11-02: 1000 mL

## 2020-11-02 MED ORDER — ONDANSETRON HCL 4 MG/2ML IJ SOLN
INTRAMUSCULAR | Status: AC
  Filled 2020-11-02: qty 2

## 2020-11-02 MED ORDER — METOCLOPRAMIDE HCL 5 MG/ML IJ SOLN
INTRAMUSCULAR | Status: AC
  Filled 2020-11-02: qty 2

## 2020-11-02 MED ORDER — PROTAMINE SULFATE 10 MG/ML IV SOLN
INTRAVENOUS | Status: AC
  Filled 2020-11-02: qty 5

## 2020-11-02 MED ORDER — AMISULPRIDE (ANTIEMETIC) 5 MG/2ML IV SOLN
INTRAVENOUS | Status: AC
  Filled 2020-11-02: qty 4

## 2020-11-02 MED ORDER — SODIUM CHLORIDE 0.9 % IV SOLN
INTRAVENOUS | Status: DC | PRN
  Administered 2020-11-02: 15:00:00 500 mL

## 2020-11-02 MED ORDER — PROPOFOL 10 MG/ML IV BOLUS
INTRAVENOUS | Status: DC | PRN
  Administered 2020-11-02 (×3): 10 mg via INTRAVENOUS

## 2020-11-02 MED ORDER — HEPARIN SODIUM (PORCINE) 1000 UNIT/ML IJ SOLN
INTRAMUSCULAR | Status: AC
  Filled 2020-11-02: qty 2

## 2020-11-02 MED ORDER — ORAL CARE MOUTH RINSE: 15.0000 mL | Freq: Once | OROMUCOSAL | Status: AC

## 2020-11-02 MED ORDER — TRAMADOL HCL 50 MG PO TABS: 50.0000 mg | ORAL_TABLET | Freq: Four times a day (QID) | ORAL | 0 refills | Status: DC | PRN

## 2020-11-02 MED ORDER — ONDANSETRON HCL 4 MG/2ML IJ SOLN
INTRAMUSCULAR | Status: DC | PRN
  Administered 2020-11-02: 4 mg via INTRAVENOUS

## 2020-11-02 SURGICAL SUPPLY — 42 items
ADH SKN CLS APL DERMABOND .7 (GAUZE/BANDAGES/DRESSINGS) ×1
APL PRP STRL LF DISP 70% ISPRP (MISCELLANEOUS) ×1
APL SKNCLS STERI-STRIP NONHPOA (GAUZE/BANDAGES/DRESSINGS) ×1
ARMBAND PINK RESTRICT EXTREMIT (MISCELLANEOUS) ×3 IMPLANT
BENZOIN TINCTURE PRP APPL 2/3 (GAUZE/BANDAGES/DRESSINGS) ×3 IMPLANT
BNDG ELASTIC 4X5.8 VLCR STR LF (GAUZE/BANDAGES/DRESSINGS) ×2 IMPLANT
BNDG GAUZE ELAST 4 BULKY (GAUZE/BANDAGES/DRESSINGS) ×2 IMPLANT
CANISTER SUCT 3000ML PPV (MISCELLANEOUS) ×3 IMPLANT
CANNULA VESSEL 3MM 2 BLNT TIP (CANNULA) ×3 IMPLANT
CHLORAPREP W/TINT 26 (MISCELLANEOUS) ×3 IMPLANT
CLIP VESOCCLUDE MED 6/CT (CLIP) ×3 IMPLANT
CLIP VESOCCLUDE SM WIDE 6/CT (CLIP) ×5 IMPLANT
CLOSURE WOUND 1/2 X4 (GAUZE/BANDAGES/DRESSINGS) ×1
COVER PROBE W GEL 5X96 (DRAPES) IMPLANT
COVER WAND RF STERILE (DRAPES) ×3 IMPLANT
DERMABOND ADVANCED (GAUZE/BANDAGES/DRESSINGS) ×2
DERMABOND ADVANCED .7 DNX12 (GAUZE/BANDAGES/DRESSINGS) IMPLANT
ELECT REM PT RETURN 9FT ADLT (ELECTROSURGICAL) ×3
ELECTRODE REM PT RTRN 9FT ADLT (ELECTROSURGICAL) ×1 IMPLANT
GLOVE SURG ORTHO 7.0 STRL STRW (GLOVE) ×2 IMPLANT
GLOVE SURG SS PI 8.0 STRL IVOR (GLOVE) ×3 IMPLANT
GLOVE SURG UNDER POLY LF SZ7 (GLOVE) ×2 IMPLANT
GOWN STRL REUS W/ TWL LRG LVL3 (GOWN DISPOSABLE) ×2 IMPLANT
GOWN STRL REUS W/ TWL XL LVL3 (GOWN DISPOSABLE) ×1 IMPLANT
GOWN STRL REUS W/TWL LRG LVL3 (GOWN DISPOSABLE) ×6
GOWN STRL REUS W/TWL XL LVL3 (GOWN DISPOSABLE) ×3
INSERT FOGARTY SM (MISCELLANEOUS) IMPLANT
KIT BASIN OR (CUSTOM PROCEDURE TRAY) ×3 IMPLANT
KIT TURNOVER KIT B (KITS) ×3 IMPLANT
NS IRRIG 1000ML POUR BTL (IV SOLUTION) ×3 IMPLANT
PACK CV ACCESS (CUSTOM PROCEDURE TRAY) ×3 IMPLANT
PAD ARMBOARD 7.5X6 YLW CONV (MISCELLANEOUS) ×6 IMPLANT
PENCIL SMOKE EVACUATOR (MISCELLANEOUS) ×3 IMPLANT
STRIP CLOSURE SKIN 1/2X4 (GAUZE/BANDAGES/DRESSINGS) ×2 IMPLANT
SUT MNCRL AB 4-0 PS2 18 (SUTURE) ×7 IMPLANT
SUT PROLENE 6 0 BV (SUTURE) ×13 IMPLANT
SUT SILK 2 0 SH (SUTURE) ×2 IMPLANT
SUT VIC AB 3-0 SH 27 (SUTURE) ×9
SUT VIC AB 3-0 SH 27X BRD (SUTURE) ×1 IMPLANT
TOWEL GREEN STERILE (TOWEL DISPOSABLE) ×3 IMPLANT
UNDERPAD 30X36 HEAVY ABSORB (UNDERPADS AND DIAPERS) ×3 IMPLANT
WATER STERILE IRR 1000ML POUR (IV SOLUTION) ×3 IMPLANT

## 2020-11-02 NOTE — Anesthesia Procedure Notes (Signed)
Anesthesia Regional Block: Supraclavicular block   Pre-Anesthetic Checklist: ,, timeout performed, Correct Patient, Correct Site, Correct Laterality, Correct Procedure, Correct Position, site marked, Risks and benefits discussed, pre-op evaluation,  At surgeon's request and post-op pain management  Laterality: Left  Prep: Maximum Sterile Barrier Precautions used, chloraprep       Needles:  Injection technique: Single-shot  Needle Type: Echogenic Stimulator Needle     Needle Length: 5cm  Needle Gauge: 22     Additional Needles:   Procedures:,,,, ultrasound used (permanent image in chart),,,,  Narrative:  Start time: 11/02/2020 1:16 PM End time: 11/02/2020 1:26 PM Injection made incrementally with aspirations every 5 mL. Anesthesiologist: Roderic Palau, MD  Additional Notes: 2% Lidocaine skin wheel. Intercostobrachial block with 8cc 0.25% Bupiv plain.

## 2020-11-02 NOTE — Anesthesia Postprocedure Evaluation (Signed)
Anesthesia Post Note  Patient: Tina Gomez  Procedure(s) Performed: LEFT UPPER ARM SECOND STAGE BASILIC VEIN TRANSPOSITION (Left )     Patient location during evaluation: PACU Anesthesia Type: Regional and MAC Level of consciousness: awake and alert Pain management: pain level controlled Vital Signs Assessment: post-procedure vital signs reviewed and stable Respiratory status: spontaneous breathing, nonlabored ventilation and respiratory function stable Cardiovascular status: stable and blood pressure returned to baseline Postop Assessment: no apparent nausea or vomiting Anesthetic complications: no   No complications documented.  Last Vitals:  Vitals:   11/02/20 1715 11/02/20 1727  BP: (!) 191/77 (!) 195/70  Pulse: 70 71  Resp: (!) 21 13  Temp:    SpO2: 95% 92%    Last Pain:  Vitals:   11/02/20 1727  TempSrc:   PainSc: 0-No pain                 Jayland Null,W. EDMOND

## 2020-11-02 NOTE — Transfer of Care (Signed)
Immediate Anesthesia Transfer of Care Note  Patient: Tina Gomez  Procedure(s) Performed: LEFT UPPER ARM SECOND STAGE BASILIC VEIN TRANSPOSITION (Left )  Patient Location: PACU  Anesthesia Type:MAC and Regional  Level of Consciousness: awake and patient cooperative  Airway & Oxygen Therapy: Patient Spontanous Breathing and Patient connected to face mask oxygen  Post-op Assessment: Report given to RN and Post -op Vital signs reviewed and stable  Post vital signs: Reviewed  Last Vitals:  Vitals Value Taken Time  BP 162/75 11/02/20 1627  Temp    Pulse 72 11/02/20 1629  Resp 21 11/02/20 1629  SpO2 90 % 11/02/20 1629  Vitals shown include unvalidated device data.  Last Pain:  Vitals:   11/02/20 1017  TempSrc:   PainSc: 0-No pain         Complications: No complications documented.

## 2020-11-02 NOTE — Discharge Instructions (Signed)
[facility]  Discharge Instructions  AV Fistula or Graft Surgery for Dialysis Access  Please refer to the following instructions for your post-procedure care. Your surgeon or physician assistant will discuss any changes with you.  Activity  You may drive the day following your surgery, if you are comfortable and no longer taking prescription pain medication. Resume full activity as the soreness in your incision resolves.  Bathing/Showering  You may shower after you go home. Keep your incision dry for 48 hours. Do not soak in a bathtub, hot tub, or swim until the incision heals completely. You may not shower if you have a hemodialysis catheter.  Incision Care  Clean your incision with mild soap and water after 48 hours. Pat the area dry with a clean towel. You do not need a bandage unless otherwise instructed. Do not apply any ointments or creams to your incision. You may have skin glue on your incision. Do not peel it off. It will come off on its own in about one week. Your arm may swell a bit after surgery. To reduce swelling use pillows to elevate your arm so it is above your heart. Your doctor will tell you if you need to lightly wrap your arm with an ACE bandage.  Diet  Resume your normal diet. There are not special food restrictions following this procedure. In order to heal from your surgery, it is CRITICAL to get adequate nutrition. Your body requires vitamins, minerals, and protein. Vegetables are the best source of vitamins and minerals. Vegetables also provide the perfect balance of protein. Processed food has little nutritional value, so try to avoid this.  Medications  Resume taking all of your medications. If your incision is causing pain, you may take over-the counter pain relievers such as acetaminophen (Tylenol). If you were prescribed a stronger pain medication, please be aware these medications can cause nausea and constipation. Prevent  nausea by taking the medication with a snack or meal. Avoid constipation by drinking plenty of fluids and eating foods with high amount of fiber, such as fruits, vegetables, and grains.  Do not take Tylenol if you are taking prescription pain medications.  Follow up Your surgeon may want to see you in the office following your access surgery. If so, this will be arranged at the time of your surgery.  Please call us immediately for any of the following conditions:  . Increased pain, redness, drainage (pus) from your incision site . Fever of 101 degrees or higher . Severe or worsening pain at your incision site . Hand pain or numbness. .  Reduce your risk of vascular disease:  . Stop smoking. If you would like help, call [facility] at 1-800-QUIT-NOW 212-510-6348) or [facility] at 905-187-3164  . Manage your cholesterol . Maintain a desired weight . Control your diabetes . Keep your blood pressure down  Dialysis  It will take several weeks to several months for your new dialysis access to be ready for use. Your surgeon will determine when it is okay to use it. Your nephrologist will continue to direct your dialysis. You can continue to use your Permcath until your new access is ready for use.   11/02/2020 LOXIE WELTE [id number] [birth year]  Surgeon(s): Cherre Robins, MD  Procedure(s): LEFT UPPER ARM SECOND STAGE BASILIC VEIN TRANSPOSITION   May stick graft immediately   May stick graft on designated area only:   X Do not stick left AV fistula for 4 weeks  If you have any questions, please call the office at [phone].

## 2020-11-02 NOTE — Interval H&P Note (Signed)
History and Physical Interval Note:  11/02/2020 1:35 PM  Tina Gomez  has presented today for surgery, with the diagnosis of ESRD.  The various methods of treatment have been discussed with the patient and family. After consideration of risks, benefits and other options for treatment, the patient has consented to  Procedure(s) with comments: LEFT UPPER EXTREMITY ARTERIOVENOUS FISTULA REVISON (Left) - PERIPHERAL NERVE BLOCK as a surgical intervention.  The patient's history has been reviewed, patient examined, no change in status, stable for surgery.  I have reviewed the patient's chart and labs.  Questions were answered to the patient's satisfaction.     Cherre Robins

## 2020-11-02 NOTE — Op Note (Signed)
DATE OF SERVICE: 11/02/2020  PATIENT:  Tina Gomez  65 y.o. female  PRE-OPERATIVE DIAGNOSIS:  ESRD on HD  POST-OPERATIVE DIAGNOSIS:  Same  PROCEDURE:   Left second stage basilic vein transposition  SURGEON:  Surgeon(s) and Role:    * Cherre Robins, MD - Primary  ASSISTANT: Paulo Fruit, PA-C  An assistant was required to facilitate exposure and expedite the case.  ANESTHESIA:   regional and MAC  EBL: 50  BLOOD ADMINISTERED:none  DRAINS: none   LOCAL MEDICATIONS USED:  NONE  SPECIMEN:  none  COUNTS: confirmed correct .  TOURNIQUET:  None  PATIENT DISPOSITION:  PACU - hemodynamically stable.   Delay start of Pharmacological VTE agent (>24hrs) due to surgical blood loss or risk of bleeding: no  INDICATION FOR PROCEDURE: [name] is a 65 y.o. female with ESRD. She underwent first stage basilic vein transposition 07/27/20. After careful discussion of risks, benefits, and alternatives the patient was offered second stage basilic vein transposition. The patient understood and wished to proceed.  OPERATIVE FINDINGS: bifid basilic vein. Dominant (lateral and deep) basilic vein used for fistula. + thrill on completion.  DESCRIPTION OF PROCEDURE: After identification of the patient in the pre-operative holding area, the patient was transferred to the operating room. The patient was positioned supine on the operating room table. Anesthesia was induced. The left arm was prepped and draped in standard fashion. A surgical pause was performed confirming correct patient, procedure, and operative location.  Using intraoperative ultrasound the course of the left basilic vein was marked on the skin.  3 skip incisions were made over the course of the basilic vein fistula.  These were carried down through subcutaneous tissue until the fistula was encountered.  The fascia was skeletonized from the axilla to the anastomosis, taking care to ligate and divide sidebranches, and to  protect the medial antebrachial cutaneous nerve.   A subcutaneous tunnel was created over the biceps using a sheath tunneling device.  Patient was heparinized.  The fistula near the anastomosis was clamped.  The outflow in the axilla was clamped.  The fistula was divided.  The fistula was marked to ensure no twisting or kinking while delivering the fistula through the tunnel.  The fistula was tunneled through the arm and delivered near the previous anastomosis.  The fistula was spatulated proximally and distally.  The fistula was reanastomosed end-to-end using continuous running suture of 5-0 Prolene.  A palpable thrill was felt over the subcutaneous course of the tunnel.  Doppler flow was excellent in the proximal and distal fistula.  The wounds were copiously irrigated.  Hemostasis was ensured in the surgical bed.  The wounds were closed in layers using 3-0 Vicryl and 4-0 Monocryl.   Upon completion of the case instrument and sharps counts were confirmed correct. The patient was transferred to the PACU in good condition. I was present for all portions of the procedure.  [name]. Stanford Breed, MD [facility] Phone Number: [phone] 11/02/2020 4:57 PM

## 2020-11-02 NOTE — Anesthesia Procedure Notes (Signed)
Procedure Name: MAC Date/Time: 11/02/2020 2:21 PM Performed by: Janene Harvey, CRNA Pre-anesthesia Checklist: Patient identified, Emergency Drugs available, Suction available and Patient being monitored Patient Re-evaluated:Patient Re-evaluated prior to induction Oxygen Delivery Method: Simple face mask Induction Type: IV induction Placement Confirmation: positive ETCO2 Dental Injury: Teeth and Oropharynx as per pre-operative assessment

## 2020-11-03 ENCOUNTER — Encounter (HOSPITAL_COMMUNITY): Payer: Self-pay | Admitting: Vascular Surgery

## 2020-11-06 DIAGNOSIS — N186 End stage renal disease: Secondary | ICD-10-CM | POA: Diagnosis not present

## 2020-11-06 DIAGNOSIS — Z992 Dependence on renal dialysis: Secondary | ICD-10-CM | POA: Diagnosis not present

## 2020-11-06 DIAGNOSIS — E1122 Type 2 diabetes mellitus with diabetic chronic kidney disease: Secondary | ICD-10-CM | POA: Diagnosis not present

## 2020-11-07 ENCOUNTER — Emergency Department (HOSPITAL_COMMUNITY): Payer: Medicare Other

## 2020-11-07 ENCOUNTER — Inpatient Hospital Stay (HOSPITAL_COMMUNITY)
Admission: EM | Admit: 2020-11-07 | Discharge: 2020-11-09 | DRG: 291 | Disposition: A | Discharge status: Home / Self Care | Payer: Medicare Other | Attending: Family Medicine | Admitting: Family Medicine

## 2020-11-07 ENCOUNTER — Ambulatory Visit: Payer: Self-pay | Admitting: *Deleted

## 2020-11-07 ENCOUNTER — Other Ambulatory Visit: Payer: Self-pay

## 2020-11-07 ENCOUNTER — Encounter (HOSPITAL_COMMUNITY): Payer: Self-pay | Admitting: Emergency Medicine

## 2020-11-07 DIAGNOSIS — N186 End stage renal disease: Secondary | ICD-10-CM | POA: Diagnosis present

## 2020-11-07 DIAGNOSIS — R0602 Shortness of breath: Secondary | ICD-10-CM

## 2020-11-07 DIAGNOSIS — R778 Other specified abnormalities of plasma proteins: Secondary | ICD-10-CM

## 2020-11-07 DIAGNOSIS — I953 Hypotension of hemodialysis: Secondary | ICD-10-CM | POA: Diagnosis not present

## 2020-11-07 DIAGNOSIS — Z885 Allergy status to narcotic agent status: Secondary | ICD-10-CM

## 2020-11-07 DIAGNOSIS — Z79899 Other long term (current) drug therapy: Secondary | ICD-10-CM

## 2020-11-07 DIAGNOSIS — I132 Hypertensive heart and chronic kidney disease with heart failure and with stage 5 chronic kidney disease, or end stage renal disease: Principal | ICD-10-CM | POA: Diagnosis present

## 2020-11-07 DIAGNOSIS — Z6830 Body mass index (BMI) 30.0-30.9, adult: Secondary | ICD-10-CM

## 2020-11-07 DIAGNOSIS — Z20822 Contact with and (suspected) exposure to covid-19: Secondary | ICD-10-CM | POA: Diagnosis present

## 2020-11-07 DIAGNOSIS — J439 Emphysema, unspecified: Secondary | ICD-10-CM | POA: Diagnosis present

## 2020-11-07 DIAGNOSIS — E785 Hyperlipidemia, unspecified: Secondary | ICD-10-CM | POA: Diagnosis present

## 2020-11-07 DIAGNOSIS — I110 Hypertensive heart disease with heart failure: Secondary | ICD-10-CM | POA: Diagnosis not present

## 2020-11-07 DIAGNOSIS — I429 Cardiomyopathy, unspecified: Secondary | ICD-10-CM | POA: Diagnosis present

## 2020-11-07 DIAGNOSIS — I509 Heart failure, unspecified: Secondary | ICD-10-CM

## 2020-11-07 DIAGNOSIS — D631 Anemia in chronic kidney disease: Secondary | ICD-10-CM | POA: Diagnosis present

## 2020-11-07 DIAGNOSIS — Z823 Family history of stroke: Secondary | ICD-10-CM

## 2020-11-07 DIAGNOSIS — E669 Obesity, unspecified: Secondary | ICD-10-CM | POA: Diagnosis present

## 2020-11-07 DIAGNOSIS — E1143 Type 2 diabetes mellitus with diabetic autonomic (poly)neuropathy: Secondary | ICD-10-CM | POA: Diagnosis present

## 2020-11-07 DIAGNOSIS — F17210 Nicotine dependence, cigarettes, uncomplicated: Secondary | ICD-10-CM | POA: Diagnosis present

## 2020-11-07 DIAGNOSIS — K3184 Gastroparesis: Secondary | ICD-10-CM | POA: Diagnosis present

## 2020-11-07 DIAGNOSIS — Z83438 Family history of other disorder of lipoprotein metabolism and other lipidemia: Secondary | ICD-10-CM

## 2020-11-07 DIAGNOSIS — J9811 Atelectasis: Secondary | ICD-10-CM | POA: Diagnosis present

## 2020-11-07 DIAGNOSIS — I440 Atrioventricular block, first degree: Secondary | ICD-10-CM | POA: Diagnosis present

## 2020-11-07 DIAGNOSIS — I248 Other forms of acute ischemic heart disease: Secondary | ICD-10-CM | POA: Diagnosis present

## 2020-11-07 DIAGNOSIS — Z7982 Long term (current) use of aspirin: Secondary | ICD-10-CM

## 2020-11-07 DIAGNOSIS — I69351 Hemiplegia and hemiparesis following cerebral infarction affecting right dominant side: Secondary | ICD-10-CM

## 2020-11-07 DIAGNOSIS — E1122 Type 2 diabetes mellitus with diabetic chronic kidney disease: Secondary | ICD-10-CM | POA: Diagnosis present

## 2020-11-07 DIAGNOSIS — Z992 Dependence on renal dialysis: Secondary | ICD-10-CM

## 2020-11-07 DIAGNOSIS — Z8249 Family history of ischemic heart disease and other diseases of the circulatory system: Secondary | ICD-10-CM

## 2020-11-07 DIAGNOSIS — G2581 Restless legs syndrome: Secondary | ICD-10-CM | POA: Diagnosis present

## 2020-11-07 DIAGNOSIS — I5021 Acute systolic (congestive) heart failure: Secondary | ICD-10-CM | POA: Diagnosis present

## 2020-11-07 LAB — COMPREHENSIVE METABOLIC PANEL
ALT: <5 U/L (ref 0–44)
AST: 15 U/L (ref 15–41)
Albumin: 2.8 g/dL — ABNORMAL LOW (ref 3.5–5.0)
Alkaline Phosphatase: 65 U/L (ref 38–126)
Anion gap: 13 (ref 5–15)
BUN: 23 mg/dL (ref 8–23)
CO2: 30 mmol/L (ref 22–32)
Calcium: 8.5 mg/dL — ABNORMAL LOW (ref 8.9–10.3)
Chloride: 98 mmol/L (ref 98–111)
Creatinine, Ser: 3.97 mg/dL — ABNORMAL HIGH (ref 0.44–1.00)
GFR, Estimated: 12 mL/min — ABNORMAL LOW (ref >60)
Glucose, Bld: 158 mg/dL — ABNORMAL HIGH (ref 70–99)
Potassium: 3.4 mmol/L — ABNORMAL LOW (ref 3.5–5.1)
Sodium: 141 mmol/L (ref 135–145)
Total Bilirubin: 0.5 mg/dL (ref 0.3–1.2)
Total Protein: 5.9 g/dL — ABNORMAL LOW (ref 6.5–8.1)

## 2020-11-07 LAB — CBC WITH DIFFERENTIAL/PLATELET
Abs Immature Granulocytes: 0.06 10*3/uL (ref 0.00–0.07)
Basophils Absolute: 0 10*3/uL (ref 0.0–0.1)
Basophils Relative: 0 %
Eosinophils Absolute: 0.2 10*3/uL (ref 0.0–0.5)
Eosinophils Relative: 2 %
HCT: 26.2 % — ABNORMAL LOW (ref 36.0–46.0)
Hemoglobin: 7.7 g/dL — ABNORMAL LOW (ref 12.0–15.0)
Immature Granulocytes: 1 %
Lymphocytes Relative: 22 %
Lymphs Abs: 1.9 10*3/uL (ref 0.7–4.0)
MCH: 27.3 pg (ref 26.0–34.0)
MCHC: 29.4 g/dL — ABNORMAL LOW (ref 30.0–36.0)
MCV: 92.9 fL (ref 80.0–100.0)
Monocytes Absolute: 0.7 10*3/uL (ref 0.1–1.0)
Monocytes Relative: 8 %
Neutro Abs: 5.8 10*3/uL (ref 1.7–7.7)
Neutrophils Relative %: 67 %
Platelets: 322 10*3/uL (ref 150–400)
RBC: 2.82 MIL/uL — ABNORMAL LOW (ref 3.87–5.11)
RDW: 16.7 % — ABNORMAL HIGH (ref 11.5–15.5)
WBC: 8.7 10*3/uL (ref 4.0–10.5)
nRBC: 0 % (ref 0.0–0.2)

## 2020-11-07 LAB — BRAIN NATRIURETIC PEPTIDE: B Natriuretic Peptide: 1369.1 pg/mL — ABNORMAL HIGH (ref 0.0–100.0)

## 2020-11-07 LAB — TROPONIN I (HIGH SENSITIVITY)
Troponin I (High Sensitivity): 78 ng/L — ABNORMAL HIGH (ref <18)
Troponin I (High Sensitivity): 79 ng/L — ABNORMAL HIGH (ref <18)

## 2020-11-07 LAB — SARS CORONAVIRUS 2 (TAT 6-24 HRS): SARS Coronavirus 2: NEGATIVE

## 2020-11-07 MED ORDER — DARBEPOETIN ALFA 100 MCG/0.5ML IJ SOSY: 100.0000 ug | PREFILLED_SYRINGE | INTRAMUSCULAR | Status: DC

## 2020-11-07 MED ORDER — CALCIUM ACETATE (PHOS BINDER) 667 MG PO CAPS
2001.0000 mg | ORAL_CAPSULE | Freq: Three times a day (TID) | ORAL | Status: DC
Start: 2020-11-08 — End: 2020-11-09
  Administered 2020-11-08 – 2020-11-09 (×4): 2001 mg via ORAL
  Filled 2020-11-07 (×5): qty 3

## 2020-11-07 MED ORDER — METHYLPREDNISOLONE SODIUM SUCC 125 MG IJ SOLR
125.0000 mg | Freq: Once | INTRAMUSCULAR | Status: AC
  Administered 2020-11-07: 125 mg via INTRAVENOUS
  Filled 2020-11-07: qty 2

## 2020-11-07 MED ORDER — AMLODIPINE BESYLATE 10 MG PO TABS
10.0000 mg | ORAL_TABLET | Freq: Every day | ORAL | Status: DC
  Administered 2020-11-07 – 2020-11-09 (×3): 10 mg via ORAL
  Filled 2020-11-07: qty 2
  Filled 2020-11-07: qty 1
  Filled 2020-11-07: qty 2

## 2020-11-07 MED ORDER — HYDRALAZINE HCL 25 MG PO TABS
25.0000 mg | ORAL_TABLET | Freq: Once | ORAL | Status: AC
  Administered 2020-11-07: 25 mg via ORAL
  Filled 2020-11-07: qty 1

## 2020-11-07 MED ORDER — ALBUTEROL SULFATE HFA 108 (90 BASE) MCG/ACT IN AERS
2.0000 | INHALATION_SPRAY | Freq: Once | RESPIRATORY_TRACT | Status: AC
  Administered 2020-11-07: 2 via RESPIRATORY_TRACT
  Filled 2020-11-07: qty 6.7

## 2020-11-07 MED ORDER — DOXERCALCIFEROL 4 MCG/2ML IV SOLN: 2.0000 ug | INTRAVENOUS | Status: DC

## 2020-11-07 MED ORDER — CARVEDILOL 3.125 MG PO TABS
12.5000 mg | ORAL_TABLET | Freq: Once | ORAL | Status: AC
  Administered 2020-11-07: 12.5 mg via ORAL
  Filled 2020-11-07: qty 4

## 2020-11-07 MED ORDER — ROPINIROLE HCL 1 MG PO TABS
2.0000 mg | ORAL_TABLET | Freq: Every day | ORAL | Status: DC
  Administered 2020-11-07 – 2020-11-08 (×2): 2 mg via ORAL
  Filled 2020-11-07 (×3): qty 2

## 2020-11-07 MED ORDER — ROPINIROLE HCL 0.5 MG PO TABS
0.5000 mg | ORAL_TABLET | Freq: Every day | ORAL | Status: DC
  Administered 2020-11-08 – 2020-11-09 (×2): 0.5 mg via ORAL
  Filled 2020-11-07 (×2): qty 1

## 2020-11-07 MED ORDER — ACETAMINOPHEN 325 MG PO TABS: 650.0000 mg | ORAL_TABLET | Freq: Four times a day (QID) | ORAL | Status: DC | PRN

## 2020-11-07 MED ORDER — AMLODIPINE BESYLATE 5 MG PO TABS
10.0000 mg | ORAL_TABLET | Freq: Once | ORAL | Status: AC
  Administered 2020-11-07: 10 mg via ORAL
  Filled 2020-11-07: qty 2

## 2020-11-07 MED ORDER — HEPARIN SODIUM (PORCINE) 5000 UNIT/ML IJ SOLN
5000.0000 [IU] | Freq: Three times a day (TID) | INTRAMUSCULAR | Status: DC
  Administered 2020-11-07 – 2020-11-09 (×6): 5000 [IU] via SUBCUTANEOUS
  Filled 2020-11-07 (×6): qty 1

## 2020-11-07 MED ORDER — ASPIRIN 325 MG PO TABS
325.0000 mg | ORAL_TABLET | Freq: Every day | ORAL | Status: DC
  Administered 2020-11-07: 325 mg via ORAL
  Filled 2020-11-07: qty 1

## 2020-11-07 MED ORDER — ACETAMINOPHEN 650 MG RE SUPP: 650.0000 mg | Freq: Four times a day (QID) | RECTAL | Status: DC | PRN

## 2020-11-07 MED ORDER — ATORVASTATIN CALCIUM 40 MG PO TABS
40.0000 mg | ORAL_TABLET | Freq: Every evening | ORAL | Status: DC
  Administered 2020-11-07 – 2020-11-09 (×3): 40 mg via ORAL
  Filled 2020-11-07 (×2): qty 1
  Filled 2020-11-07: qty 4

## 2020-11-07 MED ORDER — METOCLOPRAMIDE HCL 5 MG PO TABS
5.0000 mg | ORAL_TABLET | Freq: Two times a day (BID) | ORAL | Status: DC
  Administered 2020-11-07 – 2020-11-09 (×4): 5 mg via ORAL
  Filled 2020-11-07 (×2): qty 1
  Filled 2020-11-07: qty 0.5
  Filled 2020-11-07: qty 1
  Filled 2020-11-07 (×2): qty 0.5

## 2020-11-07 MED ORDER — CHLORHEXIDINE GLUCONATE CLOTH 2 % EX PADS
6.0000 | MEDICATED_PAD | Freq: Every day | CUTANEOUS | Status: DC
  Administered 2020-11-08 – 2020-11-09 (×2): 6 via TOPICAL

## 2020-11-07 MED ORDER — CARVEDILOL 12.5 MG PO TABS
12.5000 mg | ORAL_TABLET | Freq: Every day | ORAL | Status: DC
  Administered 2020-11-07 – 2020-11-08 (×2): 12.5 mg via ORAL
  Filled 2020-11-07: qty 4
  Filled 2020-11-07: qty 1

## 2020-11-07 NOTE — ED Notes (Signed)
Notified EDP that pt attempted urinating. She reports she makes very little urine.

## 2020-11-07 NOTE — H&P (Addendum)
[facility] Admission History and Physical Service Pager: [phone]  Patient name: Tina Gomez Medical record number: [id number] Date of birth: 13-Jun-1956 Age: 65 y.o. Gender: female  Primary Care Provider: Charlott Rakes, MD Consultants: Nephrology Code Status: Full code Preferred Emergency Contact: Selisa Tensley- daughter- [phone] (no decisions by Janett Billow)  Chief Complaint: Dyspnea  Assessment and Plan: Tina Gomez is a 65 y.o. female presenting with worsening shortness of breath x1 week . PMH is significant for COPD, T2DM, ESRD on HD MWF, HTN, HLD, anemia (previously requiring transfusion), history of CVA with residual right-sided weakness  Dyspnea, concern for HF Patient reports increased dyspnea x1 week with some chest tightness after tachypneic for several minutes, however she does not presently have chest pain. CXR showed mild interstitial edema, bibasilar atelectasis and small right effusion. Last echo on 10/22/2013 showed EF 60-65%. Labs significant for BNP 1369, troponin mildly elevated but trended flat 78>79. On physical exam patient does not have peripheral edema, but does have JVD present (though it was not measured at the time). Anticipate there will be improvement will with continued HD sessions, which will be beneficial if patient is able to tolerate an entire session. Suspect she is above dry weight of 76kg as she has shortened her HD sessions recently due to inability to tolerate them secondary to restless leg syndrome. HD will be primary mode of volume management as patient makes little to no urine. While CHF is primary diagnosis on differential, please see abnormal ecg plan below.  - Admit to FPTS, attending Dr. McDiarmid - Vitals per routine - Continuous pulse ox - Obtain echocardiogram - Supplemental O2 to maintain SPO2 > 88% - Strict I's and O's - Daily weights  Abnormal ECG T wave inversions noted in the lateral leads (V4-V6)  on ECG upon admission, troponins mildly elevated but trended flat with 78>79. Given ASA 325 mg in ED today.  Patient reports a chest "pressure" that has been present for the last week, that typically comes on with her episodes of tachypnea.  Most likely explanation is demand ischemia, but we will keep a close eye on symptoms and place patient on cardiac telemetry. As troponins have trended flat and patient currently not having chest pain there is less suspicion for an acute event. - Cardiac telemetry x24h - Consider repeat ECG on 3/2 - F/u echo as above, may consider ischemic work up based on results  Concern for COPD  Hx of tobacco use Since admission, O2 saturations have remained > 92% (goal is > 88% if COPD diagnosis is true).  Patient reports she was diagnosed during last hospitalization, there are multiple mentions of COPD in her prior admissions, though there do not seem to be any PFTs to confirm this diagnosis. Patient reports she does not have any home inhalers or home regimen. Patient reports improvement with administration of albuterol inhaler and solumedrol 125 mg IV in the ED; did use albuterol nebulizer at home that belongs to a family member which helped somewhat.  Patient will likely need further evaluation to try to be diagnosed with COPD and could possibly benefit from home maintenance medications. Recommend outpatient PFTs for diagnosis. - Continuous pulse ox - Supplemental O2 to maintain SPO2 > 88%  DMT2  Gastroparesis Diet controlled with A1c on 05/25/2020 at 7.6. Goal A1c is <8 per PCP due to multiple comorbidities to prevent hypoglycemia. Home meds for gastroparesis are reglan 5 mg QID. - Fasting CBGs in the morning  -  F/u morning A1c  ESRD on HD Patient was recently diagnosed with ESRD in November 2021, current Cr 3.97.  Patient [facility] MWF schedule at [facility]. Patient states that she is unsure of her dry weight, per Dr. Moshe Cipro it is 76 kg. Home medications include  PhosLo 3 times daily with meals.  Patient recently has not been able to tolerate entire HD sessions and has been cutting them short daily either due to her restless legs or due to her access clotting off.   - Nephrology consulted, will need HD session on 3/2 - Continue home PhosLo  Restless leg syndrome Patient has history of restless leg syndrome.  Currently being treated with Requip 2 mg nightly, requesting to have some dosing during the day she cannot lay still on the bed, and that it affects her HD sessions-which she has been stopping early. - Continue home Requip - Additional 0.5 mg added during the day to assist with hemodialysis session completion  HTN BP on admission elevated 163/49. Home medications: amlodipine 10 mg, hydralazine 25 mg q8h. Currently holding reported home hydralazine, unsure if this medication is truly a home medication as the EMR shows that the medication prescription expired previously. - Continue home amlodipine - Hold home hydralazine at this time until able to confirm prescription - Continue to monitor BPs  Anemia Hgb 7.7, threshold is 7 as patient does not have a known cardiac history.  Patient reports known anemia of chronic disease 2/2 renal failure and describes pica with eating ice.  Last Hgb on 2/24 was 8.5, baseline appears to be around 9-10. No report of hematochezia or melena. She receives aranesp with HD. -Monitor with a.m. CBC -Transfusion threshold of 7  HLD Chronic, stable. - Continue home atorvastatin 29m daily  FEN/GI: Renal with fluid restriction of 1204mProphylaxis: Lovenox  Disposition: Cardiac tele  History of Present Illness:  Tina Gomez a [age].o. female presenting with dyspnea at rest.  Patient with recent she has been feeling short of breath for about the last week.  She has had several episodes of shortness of breath when her up at night.  She describes these as episodes where she feels like she cannot get a deep enough  breath that lasts from about 30 minutes to a few hours.  She states that during these episodes she has difficulty with staying full sentences and does have a panic-like feeling during these episodes.  Patient does report episodic chest pain/"heaviness" that occurs during these breathing episodes.  She reports that she has a nonproductive cough as she feels she has something to cough up but does not have enough force to do this.   Patient reports that she is currently on hemodialysis Monday Wednesday and Friday.  She states that she has had to cut short some of her dialysis sessions due to her access clotting off or inability to tolerate due to restless leg.  She reports that that the nurse tech at the HD session on 2/28 told her she had "about 10 pounds taken off", which she states is more than usual.  She reports that she does not know what her dry weight is as a report her weights and kilograms.  Reports removal of larynx polyp by ENT, Dr. WoErik Obeyabout 10 years ago and she aspirates fluids more since then.  Reports stress recently from surgery, but also problems with getting medicare to come through because recently received a letter that she was denied medicare coverage due to incorrect  date reported for ESRD on HD. She walks with a walker at baseline. No recent history of falls. Denies fever, chills, night sweats, no sick contacts, no n/v/d.  She had fistula surgery on LUE on 2/24  Review Of Systems: Per HPI with the following additions:   Review of Systems  Constitutional: Negative for chills, fatigue and fever.  HENT: Negative for congestion.   Respiratory: Positive for cough, choking and shortness of breath.   Cardiovascular: Positive for chest pain and palpitations. Negative for leg swelling.  Gastrointestinal: Negative for abdominal pain, diarrhea, nausea and vomiting.  Skin: Negative for rash.  Neurological: Positive for dizziness.     Patient Active Problem List   Diagnosis Date  Noted  . Protein-calorie malnutrition, severe 07/28/2020  . ARF (acute renal failure) ([place]) 07/23/2020  . Diabetic gastroparesis ([place])   . Abnormal finding on GI tract imaging   . H. pylori infection   . Diverticulitis 05/24/2020  . History of stroke 05/24/2020  . Anemia 05/24/2020  . Nausea and vomiting 05/24/2020  . AKI (acute kidney injury) ([place]) 05/24/2020  . Dehydration 05/24/2020  . Hypomagnesemia 05/24/2020  . Hypokalemia 05/24/2020  . Tobacco abuse 05/24/2020  . Emphysema of lung ([place]) 05/24/2020  . Psoriasis 02/14/2016  . Type 2 diabetes mellitus with diabetic neuropathy, unspecified ([place]) 01/18/2014  . Essential hypertension, benign 01/18/2014  . GERD (gastroesophageal reflux disease) 01/18/2014  . Hyperlipidemia 01/18/2014  . Cerebral infarction ([place]) 10/22/2013  . Malignant hypertension 10/22/2013    Past Medical History: Past Medical History:  Diagnosis Date  . Anemia   . Chronic kidney disease   . COPD (chronic obstructive pulmonary disease) ([place]) 05/2019   no inhaler  . Diabetes mellitus without complication ([place])    no meds - diet controlled  . History of blood transfusion 09/2020   1 unit  . HLD (hyperlipidemia)   . Hypertension   . PONV (postoperative nausea and vomiting)   . Restless legs syndrome (RLS)   . Stroke [facility])     Past Surgical History: Past Surgical History:  Procedure Laterality Date  . AV FISTULA PLACEMENT Left 07/27/2020   Procedure: LEFT BRACHIO-BASILIC ARTERIOVENOUS (AV) FISTULA CREATION;  Surgeon: Cherre Robins, MD;  Location: [place];  Service: Vascular;  Laterality: Left;  . BASCILIC VEIN TRANSPOSITION Left 11/02/2020   Procedure: LEFT UPPER ARM SECOND STAGE BASILIC VEIN TRANSPOSITION;  Surgeon: Cherre Robins, MD;  Location: [place];  Service: Vascular;  Laterality: Left;  PERIPHERAL NERVE BLOCK  . CESAREAN SECTION     x 1  . ESOPHAGOGASTRODUODENOSCOPY (EGD) WITH PROPOFOL N/A 05/26/2020   Procedure: ESOPHAGOGASTRODUODENOSCOPY  (EGD) WITH PROPOFOL;  Surgeon: Irene Shipper, MD;  Location: [facility] ENDOSCOPY;  Service: Endoscopy;  Laterality: N/A;  . INSERTION OF DIALYSIS CATHETER Right 07/27/2020   Procedure: INSERTION OF RIGHT INTERNAL JUGULAR TUNNELED DIALYSIS CATHETER;  Surgeon: Cherre Robins, MD;  Location: [place];  Service: Vascular;  Laterality: Right;  . IR FLUORO GUIDE CV LINE RIGHT  07/25/2020  . IR US GUIDE VASC ACCESS RIGHT  07/25/2020  . TONSILLECTOMY    . UPPER GI ENDOSCOPY     growth removed from voice box  . WRIST SURGERY Left    ganglion cyst removal    Social History: Social History   Tobacco Use  . Smoking status: Current Every Day Smoker    Packs/day: 0.25    Years: 10.00    Pack years: 2.50    Types: Cigarettes  . Smokeless tobacco: Never Used  .  Tobacco comment: 5 cigs daily  Vaping Use  . Vaping Use: Never used  Substance Use Topics  . Alcohol use: No  . Drug use: No   Additional social history:   Please also refer to relevant sections of EMR.  Family History: Family History  Problem Relation Age of Onset  . Stroke Mother   . Hypertension Mother   . Hyperlipidemia Mother   . Hypertension Father   . Hyperlipidemia Father     Allergies and Medications: Allergies  Allergen Reactions  . Codeine Nausea And Vomiting  . Adhesive [Tape] Other (See Comments)    Tape breaks out the skin if it is left on for a lengthy period of time   No current facility-administered medications on file prior to encounter.   Current Outpatient Medications on File Prior to Encounter  Medication Sig Dispense Refill  . amLODipine (NORVASC) 10 MG tablet Take 1 tablet (10 mg total) by mouth daily. 30 tablet 0  . aspirin 325 MG tablet Take 1 tablet (325 mg total) by mouth daily. 90 tablet 1  . atorvastatin (LIPITOR) 40 MG tablet TAKE 1 TABLET BY MOUTH DAILY AT 6 PM. (Patient taking differently: Take 40 mg by mouth every evening.) 90 tablet 1  . B Complex-C-Zn-Folic Acid (DIALYVITE 655 WITH ZINC) 0.8  MG TABS Take 1 tablet by mouth at bedtime.    . Blood Glucose Monitoring Suppl (BLOOD GLUCOSE METER) kit Use as instructed 1 each 0  . Blood Glucose Monitoring Suppl (TRUE METRIX METER) DEVI 1 each by Does not apply route 3 (three) times daily before meals. 1 each 0  . calcium acetate (PHOSLO) 667 MG capsule Take 2,001 mg by mouth 3 (three) times daily with meals.    . carvedilol (COREG) 12.5 MG tablet Take 1 tablet (12.5 mg total) by mouth 2 (two) times daily with a meal. 60 tablet 3  . glucose blood (TRUETEST TEST) test strip Use as instructed 100 each 12  . hydrALAZINE (APRESOLINE) 25 MG tablet Take 1 tablet (25 mg total) by mouth every 8 (eight) hours. 90 tablet 0  . Lancets (FREESTYLE) lancets Use as instructed 100 each 12  . metoCLOPramide (REGLAN) 5 MG tablet TAKE 1 TABLET (5 MG TOTAL) BY MOUTH 4 (FOUR) TIMES DAILY - BEFORE MEALS AND AT BEDTIME. (Patient taking differently: Take 5 mg by mouth in the morning and at bedtime.) 120 tablet 0  . rOPINIRole (REQUIP) 2 MG tablet Take 2 mg by mouth at bedtime.    . TRUEplus Lancets 28G MISC 1 each by Does not apply route 3 (three) times daily before meals. 100 each 12    Objective: BP (!) 189/65   Pulse 70   Temp 98.1 F (36.7 C) (Oral)   Resp 16   LMP  (LMP Unknown)   SpO2 92%  Exam: General -- older [employer], NAD, standing up next to bed leaned over food tray, pleasant and cooperative, mildly obese, daughter present in room. HEENT -- Head is normocephalic. PERRLA. EOMI. Neck -- supple; no bruits Integument -- intact. No rash, erythema, or ecchymoses.  Respiratory-- good expansion. No wheezing or rales auscultated, some diminishment of breath sounds at b/l bases Cardiac -- RRR. No murmurs noted. JVD noted but not measured Abdomen -- soft, nontender, non-distended. No masses palpable. Bowel sounds present. CNS -- A&O x3 Extremeties - no tenderness or peripheral noted. ROM good. Dorsalis pedis pulses present and symmetrical. 5/5 strength of BUE,  some mild weakness of b/l LE more notable on  the right side.  Recent Fistula incision noted with ecchymoses on LUE   Labs and Imaging: CBC BMET  Recent Labs  Lab 11/07/20 1215  WBC 8.7  HGB 7.7*  HCT 26.2*  PLT 322   Recent Labs  Lab 11/07/20 1215  NA 141  K 3.4*  CL 98  CO2 30  BUN 23  CREATININE 3.97*  GLUCOSE 158*  CALCIUM 8.5*     EKG: Regular rate, sinus rhythm, T wave inversion noted in V4/V5/V6.  DG Chest Port 1 View  Result Date: 11/07/2020 CLINICAL DATA:  Shortness of breath EXAM: PORTABLE CHEST 1 VIEW COMPARISON:  07/27/2020 FINDINGS: Right dialysis catheter remains in place, unchanged. Cardiomegaly. Diffuse interstitial prominence throughout the lungs, likely interstitial edema. Bibasilar opacities, favor atelectasis. Suspect small right pleural effusion. IMPRESSION: Findings most compatible with mild interstitial edema. Bibasilar atelectasis and small right effusion. Electronically Signed   By: Rolm Baptise M.D.   On: 11/07/2020 11:21     Rise Patience, DO 11/07/2020, 4:06 PM PGY-1, [facility] Intern pager: [phone], text pages welcome  FPTS Upper-Level Resident Addendum   I have independently interviewed and examined the patient. I have discussed the above with the original author and agree with their documentation. My edits for correction/addition/clarification are in pink. Please see also any attending notes.   Gladys Damme, M.D. PGY-2, [facility] 11/07/2020 8:14 PM  [facility] Service pager: [phone] (text pages welcome through [facility])

## 2020-11-07 NOTE — ED Notes (Signed)
Pt has HD on MWF. Went Monday and completed treatment.

## 2020-11-07 NOTE — ED Triage Notes (Signed)
Pt arrives to ED with c/o shortness of breath over the past week that has worsened yesterday. Pt w/ hx of COPD and CRD. Pt with new dyspnea at rest and orthopnea.

## 2020-11-07 NOTE — Telephone Encounter (Signed)
Pt called in c/o having deep heavy breathing that is getting worse over the last week or two.   She has COPD and is on dialysis.  See notes below.  Her daughter is going to take her to the ED at [place] has a disabled daughter that lives with her.   It will be a couple of hours before she can get to the hospital.   She needs to arrange child care.   I let pt know if she got worse to call 911 but as soon as she could to get to the ED.  She was agreeable to this and will go.  I forwarded my notes to Dr. Charlott Rakes at [facility].  Reason for Disposition . [1] MODERATE difficulty breathing (e.g., speaks in phrases, SOB even at rest, pulse 100-120) AND [2] NEW-onset or WORSE than normal    Has COPD and is on dialysis  Answer Assessment - Initial Assessment Questions 1. RESPIRATORY STATUS: "Describe your breathing?" (e.g., wheezing, shortness of breath, unable to speak, severe coughing)      Pt called in c/o having to take deep heavy breaths and her chest is sore as a result.   She has COPD and is on dialysis.   Not on home O2.  " feel like I'm having to labor to breath".   "When I get up and walk around it's worse".  She had a pulse ox so I had her check her %.   It was between 87-91%.   She mentioned when she is on dialysis they put her on O2 and it seems to help her breathing. Last night she ended up getting up and putting a fan blowing on her to help her breath. 2. ONSET: "When did this breathing problem begin?"      Over the past week or two my breathing seems to be getting worse. 3. PATTERN "Does the difficult breathing come and go, or has it been constant since it started?"      It's intermittent    It just comes and goes 4. SEVERITY: "How bad is your breathing?" (e.g., mild, moderate, severe)    - MILD: No SOB at rest, mild SOB with walking, speaks normally in sentences, can lay down, no retractions, pulse < 100.    - MODERATE: SOB at rest,  SOB with minimal exertion and prefers to sit, cannot lie down flat, speaks in phrases, mild retractions, audible wheezing, pulse 100-120.    - SEVERE: Very SOB at rest, speaks in single words, struggling to breathe, sitting hunched forward, retractions, pulse > 120      Mild   Exertion makes her more short of breath. 5. RECURRENT SYMPTOM: "Have you had difficulty breathing before?" If Yes, ask: "When was the last time?" and "What happened that time?"      I have COPD so I have problems with breathing She had surgery last Thur. For a revision of her fistula used for dialysis.  "I've been under a lot of stress lately".   "I didn't know if this breathing problem was from stress/anxiety or my COPD acting up". 6. CARDIAC HISTORY: "Do you have any history of heart disease?" (e.g., heart attack, angina, bypass surgery, angioplasty)      Not asked 7. LUNG HISTORY: "Do you have any history of lung disease?"  (e.g., pulmonary embolus, asthma, emphysema)     COPD and on dialysis 8. CAUSE: "What do you think is causing  the breathing problem?"      I'm not sure.   Maybe the COPD getting worse and I need O2 at home maybe. 9. OTHER SYMPTOMS: "Do you have any other symptoms? (e.g., dizziness, runny nose, cough, chest pain, fever)     Chest is sore from the deep heavy breathing. 10. PREGNANCY: "Is there any chance you are pregnant?" "When was your last menstrual period?"       N/A due to age 65. TRAVEL: "Have you traveled out of the country in the last month?" (e.g., travel history, exposures)       No travels or exposures she is aware of  Protocols used: BREATHING DIFFICULTY-A-AH

## 2020-11-07 NOTE — Plan of Care (Signed)

## 2020-11-07 NOTE — ED Provider Notes (Signed)
[facility] EMERGENCY DEPARTMENT Provider Note   CSN: [id number] Arrival date & time: 11/07/20  1020     History Chief Complaint  Patient presents with  . Shortness of Breath    Tina Gomez is a 65 y.o. 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 Pt also complains of some chest tightness that she attributes to trying to expand her lungs to full capacity to get good air movement. Pt did recently have surgery to her AV fistula on 2/24 however states she was having SOB prior to the procedure. She is not anticoagulated. She did have a COVID test as well the day before her procedure which was negative and she denies any recent sick contacts. No cough, fevers, chills, body aches. Pt has been using her breathing treatments at home without relief. She called her PCP today to schedule an appointment to be seen however was told to come to the ED instead. Pt does mention that she is placed on oxygen during her dialysis sessions and she feels this improves her SOB. She is not on home O2. She has not missed any dialysis sessions; last yesterday. No other complaints at this time.    The history is provided by the patient and medical records.       Past Medical History:  Diagnosis Date  . Anemia   . Chronic kidney disease   . COPD (chronic obstructive pulmonary disease) ([place]) 05/2019   no inhaler  . Diabetes mellitus without complication ([place])    no meds - diet controlled  . History of blood transfusion 09/2020   1 unit  . HLD (hyperlipidemia)   . Hypertension   . PONV (postoperative nausea and vomiting)   . Restless legs syndrome (RLS)   . Stroke [facility])     Patient Active Problem List   Diagnosis Date Noted  . Protein-calorie malnutrition, severe 07/28/2020  . ARF (acute renal  failure) ([place]) 07/23/2020  . Diabetic gastroparesis ([place])   . Abnormal finding on GI tract imaging   . H. pylori infection   . Diverticulitis 05/24/2020  . History of stroke 05/24/2020  . Anemia 05/24/2020  . Nausea and vomiting 05/24/2020  . AKI (acute kidney injury) ([place]) 05/24/2020  . Dehydration 05/24/2020  . Hypomagnesemia 05/24/2020  . Hypokalemia 05/24/2020  . Tobacco abuse 05/24/2020  . Emphysema of lung ([place]) 05/24/2020  . Psoriasis 02/14/2016  . Type 2 diabetes mellitus with diabetic neuropathy, unspecified ([place]) 01/18/2014  . Essential hypertension, benign 01/18/2014  . GERD (gastroesophageal reflux disease) 01/18/2014  . Hyperlipidemia 01/18/2014  . Cerebral infarction ([place]) 10/22/2013  . 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 AV FISTULA PLACEMENT Left 07/27/2020   Procedure: LEFT BRACHIO-BASILIC ARTERIOVENOUS (AV) FISTULA CREATION;  Surgeon: Cherre Robins, MD;  Location: [facility];  Service: Vascular;  Laterality: Left;  . BASCILIC VEIN TRANSPOSITION Left 11/02/2020   Procedure: LEFT UPPER ARM SECOND STAGE BASILIC VEIN TRANSPOSITION;  Surgeon: Cherre Robins, MD;  Location: [facility];  Service: Vascular;  Laterality: Left;  PERIPHERAL NERVE BLOCK  . CESAREAN SECTION     x 1  . ESOPHAGOGASTRODUODENOSCOPY (EGD) WITH PROPOFOL N/A 05/26/2020   Procedure: ESOPHAGOGASTRODUODENOSCOPY (EGD) WITH PROPOFOL;  Surgeon: Irene Shipper, MD;  Location: [facility] ENDOSCOPY;  Service: Endoscopy;  Laterality: N/A;  . INSERTION OF DIALYSIS CATHETER Right 07/27/2020   Procedure: INSERTION OF RIGHT INTERNAL JUGULAR TUNNELED DIALYSIS CATHETER;  Surgeon: Cherre Robins, MD;  Location: [facility];  Service: Vascular;  Laterality: Right;  . IR FLUORO GUIDE CV LINE RIGHT  07/25/2020  . IR US GUIDE VASC ACCESS RIGHT  07/25/2020  . TONSILLECTOMY    . UPPER GI ENDOSCOPY     growth removed from voice box  . WRIST SURGERY Left    ganglion cyst removal      OB History   No obstetric history on file.     Family History  Problem Relation Age of Onset  . Stroke Mother   . Hypertension Mother   . Hyperlipidemia Mother   . Hypertension Father   . Hyperlipidemia Father     Social History   Tobacco Use  . Smoking status: Current Every Day Smoker    Packs/day: 0.25    Years: 10.00    Pack years: 2.50    Types: Cigarettes  . Smokeless tobacco: Never Used  . Tobacco comment: 5 cigs daily  Vaping Use  . Vaping Use: Never used  Substance Use Topics  . Alcohol use: No  . Drug use: No    Home Medications Prior to Admission medications   Medication Sig Start Date End Date Taking? Authorizing Provider  amLODipine (NORVASC) 10 MG tablet Take 1 tablet (10 mg total) by mouth daily. 08/01/20 08/31/20 Yes Dahal, Marlowe Aschoff, MD  aspirin 325 MG tablet Take 1 tablet (325 mg total) by mouth daily. 11/25/18  Yes McClung, Angela M, PA-C  atorvastatin (LIPITOR) 40 MG tablet TAKE 1 TABLET BY MOUTH DAILY AT 6 PM. Patient taking differently: Take 40 mg by mouth every evening. 05/23/20  Yes Newlin, Charlane Ferretti, MD  B Complex-C-Zn-Folic Acid (DIALYVITE 762 WITH ZINC) 0.8 MG TABS Take 1 tablet by mouth at bedtime. 08/12/20  Yes [provider]  Blood Glucose Monitoring Suppl (BLOOD GLUCOSE METER) kit Use as instructed 11/01/13  Yes Reyne Dumas, MD  Blood Glucose Monitoring Suppl (TRUE METRIX METER) DEVI 1 each by Does not apply route 3 (three) times daily before meals. 06/02/20  Yes Charlott Rakes, MD  calcium acetate (PHOSLO) 667 MG capsule Take 2,001 mg by mouth 3 (three) times daily with meals. 09/19/20  Yes [provider]  carvedilol (COREG) 12.5 MG tablet Take 1 tablet (12.5 mg total) by mouth 2 (two) times daily with a meal. 06/15/20  Yes Newlin, Enobong, MD  glucose blood (TRUETEST TEST) test strip Use as instructed 06/02/20  Yes Newlin, Enobong, MD  hydrALAZINE (APRESOLINE) 25 MG tablet Take 1 tablet (25 mg total) by mouth every 8 (eight)  hours. 07/31/20 08/30/20 Yes Dahal, Marlowe Aschoff, MD  Lancets (FREESTYLE) lancets Use as instructed 11/01/13  Yes Reyne Dumas, MD  metoCLOPramide (REGLAN) 5 MG tablet TAKE 1 TABLET (5 MG TOTAL) BY MOUTH 4 (FOUR) TIMES DAILY - BEFORE MEALS AND AT BEDTIME. Patient taking differently: Take 5 mg by mouth in the morning and at bedtime. 09/19/20  Yes Willia Craze, NP  rOPINIRole (REQUIP) 2 MG tablet Take 2 mg by mouth at bedtime.   Yes [provider]  TRUEplus Lancets 28G MISC 1 each by Does not apply route 3 (three) times daily before meals. 06/02/20  Yes Charlott Rakes, MD    Allergies    Codeine and Adhesive [tape]  Review of Systems   Review of Systems  Constitutional: Negative for chills and fever.  Respiratory: Positive for chest  tightness and shortness of breath. Negative for cough and wheezing.   Cardiovascular: Negative for chest pain and leg swelling.  Gastrointestinal: Negative for abdominal pain, nausea and vomiting.  All other systems reviewed and are negative.   Physical Exam Updated Vital Signs BP (!) 167/62 Comment: RA  Pulse 78 Comment: RA  Resp 20 Comment: RA  LMP  (LMP Unknown)   SpO2 96%   Physical Exam Vitals and nursing note reviewed.  Constitutional:      Appearance: She is obese. She is not ill-appearing or diaphoretic.  HENT:     Head: Normocephalic and atraumatic.  Eyes:     Conjunctiva/sclera: Conjunctivae normal.  Cardiovascular:     Rate and Rhythm: Normal rate and regular rhythm.     Pulses: Normal pulses.  Pulmonary:     Effort: Pulmonary effort is normal.     Comments: Speaking in full sentences without difficulty. Satting 96% on RA. Diminished breath sounds at the bases however no wheezing.  Chest:     Comments: Right IJ in place Abdominal:     Palpations: Abdomen is soft.     Tenderness: There is no abdominal tenderness. There is no guarding or rebound.  Musculoskeletal:     Cervical back: Neck supple.     Right lower leg: No  edema.     Left lower leg: No edema.  Skin:    General: Skin is warm and dry.     Comments: Ecchymosis noted to LUE from recent incision to left medial upper arm; no erythema or drainage appreciated to incision site  Neurological:     Mental Status: She is alert.     ED Results / Procedures / Treatments   Labs (all labs ordered are listed, but only abnormal results are displayed) Labs Reviewed  COMPREHENSIVE METABOLIC PANEL - Abnormal; Notable for the following components:      Result Value   Potassium 3.4 (*)    Glucose, Bld 158 (*)    Creatinine, Ser 3.97 (*)    Calcium 8.5 (*)    Total Protein 5.9 (*)    Albumin 2.8 (*)    GFR, Estimated 12 (*)    All other components within normal limits  CBC WITH DIFFERENTIAL/PLATELET - Abnormal; Notable for the following components:   RBC 2.82 (*)    Hemoglobin 7.7 (*)    HCT 26.2 (*)    MCHC 29.4 (*)    RDW 16.7 (*)    All other components within normal limits  BRAIN NATRIURETIC PEPTIDE - Abnormal; Notable for the following components:   B Natriuretic Peptide 1,369.1 (*)    All other components within normal limits  TROPONIN I (HIGH SENSITIVITY) - Abnormal; Notable for the following components:   Troponin I (High Sensitivity) 78 (*)    All other components within normal limits  TROPONIN I (HIGH SENSITIVITY) - Abnormal; Notable for the following components:   Troponin I (High Sensitivity) 79 (*)    All other components within normal limits  URINALYSIS, ROUTINE W REFLEX MICROSCOPIC    EKG EKG Interpretation  Date/Time:  Tuesday November 07 2020 10:28:54 EST Ventricular Rate:  76 PR Interval:    QRS Duration: 90 QT Interval:  401 QTC Calculation: 451 R Axis:   69 Text Interpretation: Sinus rhythm Prolonged PR interval Probable left atrial enlargement Anteroseptal infarct, old Abnormal T, consider ischemia, lateral leads NSR, T wave inversions laterally which is changed from previous Confirmed by Lavenia Atlas [id number]) on 11/07/2020  10:31:32 AM  Radiology DG Chest Port 1 View  Result Date: 11/07/2020 CLINICAL DATA:  Shortness of breath EXAM: PORTABLE CHEST 1 VIEW COMPARISON:  07/27/2020 FINDINGS: Right dialysis catheter remains in place, unchanged. Cardiomegaly. Diffuse interstitial prominence throughout the lungs, likely interstitial edema. Bibasilar opacities, favor atelectasis. Suspect small right pleural effusion. IMPRESSION: Findings most compatible with mild interstitial edema. Bibasilar atelectasis and small right effusion. Electronically Signed   By: Rolm Baptise M.D.   On: 11/07/2020 11:21    Procedures Procedures   Medications Ordered in ED Medications  aspirin tablet 325 mg (has no administration in time range)  hydrALAZINE (APRESOLINE) tablet 25 mg (has no administration in time range)  carvedilol (COREG) tablet 12.5 mg (has no administration in time range)  amLODipine (NORVASC) tablet 10 mg (has no administration in time range)  albuterol (VENTOLIN HFA) 108 (90 Base) MCG/ACT inhaler 2 puff (2 puffs Inhalation Given 11/07/20 1137)  methylPREDNISolone sodium succinate (SOLU-MEDROL) 125 mg/2 mL injection 125 mg (125 mg Intravenous Given 11/07/20 1239)    ED Course  I have reviewed the triage vital signs and the nursing notes.  Pertinent labs & imaging results that were available during my care of the patient were reviewed by me and considered in my medical decision making (see chart for details).    MDM Rules/Calculators/A&P                          65 year old female with a history of ESRD on dialysis Monday Wednesday Friday presents to the ED today complaining of shortness of breath for the past week, gradually worsening.  Is also having some chest tightness that she attributes to trying to expand her lung to take a deep breath in.  Was advised by her PCP to come to the ED today for further evaluation.  Has not missed any dialysis sessions.  On arrival to the ED vitals are stable. She is afebrile,  nontachycardic with a rate of 78, nontachypneic.  Patient appears to be in no acute distress at this time.  She is able speak in full sentences without difficulty and satting 96% on room air.  She does mention that she is placed on oxygen during her dialysis sessions and this seems to improve her shortness of breath.  Patient also has a history of COPD, has been using her breathing treatments at home without relief.  On exam she does have diminished breath sounds at the bases however no signs of wheezing at this time.  Does not appear to have history of CHF and does not appear fluid overloaded at this time.  An EKG was obtained showing does show some T wave inversions in the lateral leads which is new.  Will work-up for shortness of breath at this time with chest x-ray, CBC, CMP, troponin, BNP.  Patient does report she was short of breath prior to her recent surgery on 2/24 for second stage basilic vein transposition.  Given she is nontachycardic and nonhypoxic with shortness of breath occurring before her surgery I have lower suspicion for PE at this time.  Will provide albuterol inhaler and Solu-Medrol for possible COPD exacerbation.  She denies any recent sick contacts, did have a Covid test done prior to her surgery, lower suspicion today for same given no other additional symptoms including cough, body aches.   CXR with findings most consistent with interstitial edema. Also has small R pleural effusion.   CBC without leukocytosis. Hgb 7.7; history of anemia; only slightly  decreased from baseline. Suspect anemia s/2 CKD  Hemoglobin  Date Value Ref Range Status  11/07/2020 7.7 (L) 12.0 - 15.0 g/dL Final  11/02/2020 8.5 (L) 12.0 - 15.0 g/dL Final  07/28/2020 8.1 (L) 12.0 - 15.0 g/dL Final  07/27/2020 9.0 (L) 12.0 - 15.0 g/dL Final   CMP with stable creatinine 3.97. Potassium stable 3.4. No other electrolyte abnormalities Troponin elevated at 78 today; will need repeat to trend. No previous troponins  to compare to. Will plan to touch base with cardiology after repeat troponin for further recommendations in the setting of new TWIs   BNP elevated 1,369.1 (compared to 209.4 four months ago). Pt has not had an ECHO since 2015.   Spoke with Wannetta Sender with cardiology - recommends medicine admission and for them to formally consult as needed.   Discussed case with family medicine who agrees to accept patient for admission.   This note was prepared using Dragon voice recognition software and may include unintentional dictation errors due to the inherent limitations of voice recognition software.  Final Clinical Impression(s) / ED Diagnoses Final diagnoses:  Shortness of breath  Acute on chronic congestive heart failure, unspecified heart failure type ([place])  Elevated troponin    Rx / DC Orders ED Discharge Orders    None       Eustaquio Maize, PA-C 11/07/20 1611    Lorelle Gibbs, DO 11/07/20 1634

## 2020-11-07 NOTE — Progress Notes (Signed)
Pt known to me from the [facility]- fairly recent start.  Came in today c/o SOB that has been longstanding.  She was treated with bronchodilators and steroids and feels much better but evidence indicates some volume overload and higher troponins SO is being admitted for OBS to get echo-  troponins have flattened   Of note , at the OP kidney center-  Her treatments have been shortened-  Usually by her.  She suffers for RLS and finds it hard to sit still during HD.  As a result she has been leaving out over her EDW of 76  Complete orders [employer] MWF-  3 hours and 45 minutes- 3 K, 2.25 calc, profile 4, EDW 76-   Using TDC-  Needs AVF revision-  Heparin yes- mircera 225 q 2 weeks, hectorol 2- last hgb 8.2, k 4.1, calc 8.1, phos 9.6, pth 323  Will put in orders for her to get HD here tomorrow-  First shift-  Possible discharge.  IF admission gets converted to inpatient will do formal consult.  Feel free to use requip for her RLS-  She seems to respond but have not found perfect dosing yet   Louis Meckel

## 2020-11-08 ENCOUNTER — Observation Stay (HOSPITAL_COMMUNITY): Payer: Medicare Other

## 2020-11-08 DIAGNOSIS — I953 Hypotension of hemodialysis: Secondary | ICD-10-CM | POA: Diagnosis not present

## 2020-11-08 DIAGNOSIS — R0602 Shortness of breath: Secondary | ICD-10-CM | POA: Diagnosis not present

## 2020-11-08 DIAGNOSIS — I429 Cardiomyopathy, unspecified: Secondary | ICD-10-CM | POA: Diagnosis present

## 2020-11-08 DIAGNOSIS — Z79899 Other long term (current) drug therapy: Secondary | ICD-10-CM | POA: Diagnosis not present

## 2020-11-08 DIAGNOSIS — E1143 Type 2 diabetes mellitus with diabetic autonomic (poly)neuropathy: Secondary | ICD-10-CM | POA: Diagnosis present

## 2020-11-08 DIAGNOSIS — I420 Dilated cardiomyopathy: Secondary | ICD-10-CM | POA: Diagnosis not present

## 2020-11-08 DIAGNOSIS — Z992 Dependence on renal dialysis: Secondary | ICD-10-CM | POA: Diagnosis not present

## 2020-11-08 DIAGNOSIS — I248 Other forms of acute ischemic heart disease: Secondary | ICD-10-CM | POA: Diagnosis present

## 2020-11-08 DIAGNOSIS — R9431 Abnormal electrocardiogram [ECG] [EKG]: Secondary | ICD-10-CM

## 2020-11-08 DIAGNOSIS — J439 Emphysema, unspecified: Secondary | ICD-10-CM | POA: Diagnosis present

## 2020-11-08 DIAGNOSIS — Z6830 Body mass index (BMI) 30.0-30.9, adult: Secondary | ICD-10-CM | POA: Diagnosis not present

## 2020-11-08 DIAGNOSIS — I509 Heart failure, unspecified: Secondary | ICD-10-CM | POA: Diagnosis not present

## 2020-11-08 DIAGNOSIS — Z20822 Contact with and (suspected) exposure to covid-19: Secondary | ICD-10-CM | POA: Diagnosis present

## 2020-11-08 DIAGNOSIS — K3184 Gastroparesis: Secondary | ICD-10-CM | POA: Diagnosis present

## 2020-11-08 DIAGNOSIS — Z8249 Family history of ischemic heart disease and other diseases of the circulatory system: Secondary | ICD-10-CM | POA: Diagnosis not present

## 2020-11-08 DIAGNOSIS — D631 Anemia in chronic kidney disease: Secondary | ICD-10-CM | POA: Diagnosis present

## 2020-11-08 DIAGNOSIS — F17210 Nicotine dependence, cigarettes, uncomplicated: Secondary | ICD-10-CM | POA: Diagnosis present

## 2020-11-08 DIAGNOSIS — E669 Obesity, unspecified: Secondary | ICD-10-CM | POA: Diagnosis present

## 2020-11-08 DIAGNOSIS — G2581 Restless legs syndrome: Secondary | ICD-10-CM | POA: Diagnosis present

## 2020-11-08 DIAGNOSIS — I5021 Acute systolic (congestive) heart failure: Secondary | ICD-10-CM | POA: Diagnosis present

## 2020-11-08 DIAGNOSIS — Z823 Family history of stroke: Secondary | ICD-10-CM | POA: Diagnosis not present

## 2020-11-08 DIAGNOSIS — I132 Hypertensive heart and chronic kidney disease with heart failure and with stage 5 chronic kidney disease, or end stage renal disease: Secondary | ICD-10-CM | POA: Diagnosis present

## 2020-11-08 DIAGNOSIS — I5023 Acute on chronic systolic (congestive) heart failure: Secondary | ICD-10-CM | POA: Diagnosis not present

## 2020-11-08 DIAGNOSIS — E785 Hyperlipidemia, unspecified: Secondary | ICD-10-CM | POA: Diagnosis present

## 2020-11-08 DIAGNOSIS — I69351 Hemiplegia and hemiparesis following cerebral infarction affecting right dominant side: Secondary | ICD-10-CM | POA: Diagnosis not present

## 2020-11-08 DIAGNOSIS — J9811 Atelectasis: Secondary | ICD-10-CM | POA: Diagnosis present

## 2020-11-08 DIAGNOSIS — N186 End stage renal disease: Secondary | ICD-10-CM | POA: Diagnosis present

## 2020-11-08 DIAGNOSIS — I440 Atrioventricular block, first degree: Secondary | ICD-10-CM | POA: Diagnosis present

## 2020-11-08 DIAGNOSIS — E1122 Type 2 diabetes mellitus with diabetic chronic kidney disease: Secondary | ICD-10-CM | POA: Diagnosis present

## 2020-11-08 LAB — BASIC METABOLIC PANEL
Anion gap: 14 (ref 5–15)
BUN: 33 mg/dL — ABNORMAL HIGH (ref 8–23)
CO2: 24 mmol/L (ref 22–32)
Calcium: 8.4 mg/dL — ABNORMAL LOW (ref 8.9–10.3)
Chloride: 99 mmol/L (ref 98–111)
Creatinine, Ser: 4.64 mg/dL — ABNORMAL HIGH (ref 0.44–1.00)
GFR, Estimated: 10 mL/min — ABNORMAL LOW (ref >60)
Glucose, Bld: 156 mg/dL — ABNORMAL HIGH (ref 70–99)
Potassium: 3.6 mmol/L (ref 3.5–5.1)
Sodium: 137 mmol/L (ref 135–145)

## 2020-11-08 LAB — CBC
HCT: 22.2 % — ABNORMAL LOW (ref 36.0–46.0)
Hemoglobin: 7.1 g/dL — ABNORMAL LOW (ref 12.0–15.0)
MCH: 28.7 pg (ref 26.0–34.0)
MCHC: 32 g/dL (ref 30.0–36.0)
MCV: 89.9 fL (ref 80.0–100.0)
Platelets: 273 10*3/uL (ref 150–400)
RBC: 2.47 MIL/uL — ABNORMAL LOW (ref 3.87–5.11)
RDW: 16.4 % — ABNORMAL HIGH (ref 11.5–15.5)
WBC: 8.8 10*3/uL (ref 4.0–10.5)
nRBC: 0 % (ref 0.0–0.2)

## 2020-11-08 LAB — ECHOCARDIOGRAM COMPLETE
Area-P 1/2: 3.91 cm2
Calc EF: 40.2 %
S' Lateral: 4.9 cm
Single Plane A2C EF: 39 %
Single Plane A4C EF: 37.8 %
Weight: 2610.25 oz

## 2020-11-08 LAB — GLUCOSE, CAPILLARY: Glucose-Capillary: 137 mg/dL — ABNORMAL HIGH (ref 70–99)

## 2020-11-08 LAB — MRSA PCR SCREENING: MRSA by PCR: NEGATIVE

## 2020-11-08 LAB — HEMOGLOBIN A1C
Hgb A1c MFr Bld: 6.4 % — ABNORMAL HIGH (ref 4.8–5.6)
Mean Plasma Glucose: 136.98 mg/dL

## 2020-11-08 MED ORDER — HEPARIN SODIUM (PORCINE) 1000 UNIT/ML IJ SOLN
INTRAMUSCULAR | Status: AC
  Filled 2020-11-08: qty 4

## 2020-11-08 MED ORDER — DARBEPOETIN ALFA 100 MCG/0.5ML IJ SOSY
PREFILLED_SYRINGE | INTRAMUSCULAR | Status: AC
  Administered 2020-11-08: 100 ug via INTRAVENOUS
  Filled 2020-11-08: qty 0.5

## 2020-11-08 MED ORDER — DOXERCALCIFEROL 4 MCG/2ML IV SOLN
INTRAVENOUS | Status: AC
  Administered 2020-11-08: 2 ug via INTRAVENOUS
  Filled 2020-11-08: qty 2

## 2020-11-08 NOTE — Progress Notes (Signed)
Heart Failure Navigator Progress Note  Screening/Assessed for Heart & Vascular TOC clinic readiness as this pt was admitted for CHF exacerbation.  Unfortunately at this time the patient does not meet criteria due to ESRD on Hemodialysis.   Navigator available for reassessment of patient.   Pricilla Holm, RN, BSN Heart Failure Nurse Navigator [phone]

## 2020-11-08 NOTE — Hospital Course (Addendum)
Tina Gomez is a 65 y.o. female who presented with worsening shortness of breath in the setting of ESRD and incomplete dialysis sessions. PMH is significant for COPD, T2DM, ESRD on HD MWF, HTN, HLD, anemia (previously requiring transfusion), history of CVA with residual right-sided weakness. Below is her hospital course.   Dyspnea, likely multifactorial  ESRD  New HFrEF In ED, CXR consistent with interstitial edema and right pleural effusion. Vitals stable- not tachycardic or hypoxic. CBC without leukocytosis, Hgb 7.7. Creatinine 3.97, K 3.4. EKG with T-wave inversions,Troponin 78>79, BNP 1,369.1 . Cardiology consulted in ED and recommended admission. (increased from 209.4 four months ago). No peripheral edema in extremities, but JVD present on physical exam. Nephrology was consulted and noted that patients outpatient dialysis treatments had been shortened by her, due to RLS and difficulty sitting still during HD. Given this, requip was added to help her RLS and patient was given dialysis the following day and was able to complete the session.   HFrEF  cardiomyopathy, etiology unclear Echocardiogram performed in the hospital demonstrated EF 30% with moderate to severely decreased function and global hypokinesis of the LV, grade II diastolic dysfunction.  Cardiology was consulted with recommendations for outpatient follow-up with cardiac catheterization.  Volume management will be per hemodialysis as patient does not make much urine and is not really candidate for diuretics.  It will be very important to optimize her hemodialysis sessions.  Restless leg syndrome Patient noted to have restless leg syndrome, which has been interfering with ability to tolerate full hemodialysis sessions.  During hospitalization, patient was started on Requip 0.5 mg daily in addition to her home medication of 2 mg at night.  Patient was able to tolerate full hemodialysis session and discharged with additional 0.5 mg of  Requip.  Anemia, likely of chronic disease While hospitalized, patient was found to have anemia with Hgb ranging from 7.1 - 7.5.  Patient reports a history of anemia, due to age range and lack of prior colonoscopy would recommend outpatient colonoscopy.   Issues for follow-up: Patient discharged with extra 0.'5mg'$  Requip to assist with HD session toleration.  Patient needs colonoscopy and further evaluation for possible causes of anemia. Hgb on discharge was 7.3. Transfusion threshold of 7 as patient does not have documented cardiac disease.  Recommend outpatient PFT's due to concern for COPD without formal diagnosis. Hb 7.3 in hospital. Recommend CBC 3-5 days post discharge. Outpatient follow-up with cardiology for arrangement of outpatient cath as well as labs (TSH, and renal function panel) Patient started on Losartan '50mg'$  daily as a recommendation for cardiology, recommend BP monitoring with titration of medications

## 2020-11-08 NOTE — Telephone Encounter (Signed)
Pt is in ED.

## 2020-11-08 NOTE — Plan of Care (Signed)
  Problem: Clinical Measurements: Goal: Ability to maintain clinical measurements within normal limits will improve Outcome: Progressing   

## 2020-11-08 NOTE — Progress Notes (Signed)
Report given to HD nurse, transport on the way.

## 2020-11-08 NOTE — Progress Notes (Signed)
Family Medicine Teaching Service Daily Progress Note Intern Pager: [phone]  Patient name: Tina Gomez Medical record number: [id number] Date of birth: [birth year] Age: 65 y.o. Gender: female  Primary Care Provider: Charlott Rakes, MD Consultants: Nephrology Code Status: Full  Pt Overview and Major Events to Date:  3/1 - Admitted   Assessment and Plan: Tina Gomez is a 65 y.o. female presenting with worsening shortness of breath x1 week with concern for heart failure. PMH is significant for COPD, T2DM, ESRD on HD MWF, HTN, HLD, anemia (previously requiring transfusion), history of CVA with residual right-sided weakness  Dyspnea, concern for HF Patient reports significant improvement in her symptoms and that she has not had any episodes of dyspnea since she received dialysis this mornign. Patient remains without oxygen requirement. Weight today is 75kg (down from 77kg prior to HD). Due to improvement in symptoms, likely fluid overload related to decreased HD sessions. - F/u echocardiogram - Continuous pulse ox  - supplemental O2 to maintain O2 sats >88% - Strict I's and O's - Daily weights  Abnormal ECG ECG on admission significant for T wave inversions in the lateral leads (V4/V5/V6). Troponins were trended flat and patient reports no chest pain symptoms at this time. Still awaiting echo to be obtained.  - Continue to monitor for development of cardiac symptoms - F/u repeat ECG today - F/u echo  Concern for COPD  Hx of tobacco use Patient not yet diagnosed with COPD, though prior hospitalizations have stated it as a diagnosis.  - Recommend outpatient PFTs - Continuous pulse ox - Supplemental O2 to maintain sats >88%  ESRD on HD (MWF) Plan for HD today. Patient unsure of dry weight, using Dr. Shelva Majestic recommendation of [phone]. - Monitor for symptom improvement after HD session - Continue home PhosLo  Restless leg syndrome - Continue home requip nightly - Continue  new daily dose of 0.'5mg'$  to see if HD sessions better tolerated today  HTN BP range since admission 144/55. Monitor for leg swelling, pt reported she was previously on a medication that caused leg swelling issues, which is a common AE of amlodipine, but she stated that is the medication she is taking.  - Continue home amlodipine - Monitor for leg swelling  Anemia, likely 2/2 chronic disease Hgb on admission 7.7. Transfusion threshold of 7.  - Continue to monitor with CBC  FEN/GI: renal with fluid restriction 1267m PPx: Lovenox   Status is: Observation  The patient remains OBS appropriate and will d/c before 2 midnights.  Dispo: The patient is from: Home              Anticipated d/c is to: Home              Patient currently is not medically stable to d/c.   Difficult to place patient No   Subjective:  Patient reports that she feels really well this morning and "back to normal". She has not had any episodic dyspnea and is feeling great. She states that she has not had to use the inhaler.  Objective: Temp:  [97.5 F (36.4 C)-98.7 F (37.1 C)] 98.5 F (36.9 C) (03/02 1238) Pulse Rate:  [64-75] 66 (03/02 1238) Resp:  [12-24] 17 (03/02 1238) BP: (143-195)/(49-82) 149/64 (03/02 1238) SpO2:  [92 %-97 %] 97 % (03/02 1238) Weight:  [74 kg-78.7 kg] 74 kg (03/02 1113) Physical Exam: General: NAD, walking around in the room Cardiovascular: RRR, no m/r/g Respiratory: CTAB, no rales or crackles noted, comfortable on room air.  Abdomen: soft, non-tender, non-distended Extremities: moving all appropriately, up walking in the room.  Laboratory: Recent Labs  Lab 11/02/20 1033 11/07/20 1215 11/08/20 0442  WBC  --  8.7 8.8  HGB 8.5* 7.7* 7.1*  HCT 25.0* 26.2* 22.2*  PLT  --  322 273   Recent Labs  Lab 11/02/20 1033 11/07/20 1215 11/08/20 0442  NA 139 141 137  K 3.6 3.4* 3.6  CL 98 98 99  CO2  --  30 24  BUN 26* 23 33*  CREATININE 4.50* 3.97* 4.64*  CALCIUM  --  8.5*  8.4*  PROT  --  5.9*  --   BILITOT  --  0.5  --   ALKPHOS  --  65  --   ALT  --  <5  --   AST  --  15  --   GLUCOSE 100* 158* 156*    Imaging/Diagnostic Tests: No results found.   Tina Patience, DO 11/08/2020, 2:16 PM PGY-1, [place] Intern pager: [phone], text pages welcome

## 2020-11-08 NOTE — Progress Notes (Signed)
  Echocardiogram 2D Echocardiogram has been performed.  Michiel Cowboy 11/08/2020, 1:41 PM

## 2020-11-09 ENCOUNTER — Other Ambulatory Visit (HOSPITAL_COMMUNITY): Payer: Self-pay | Admitting: Family Medicine

## 2020-11-09 DIAGNOSIS — I420 Dilated cardiomyopathy: Secondary | ICD-10-CM

## 2020-11-09 DIAGNOSIS — I5023 Acute on chronic systolic (congestive) heart failure: Secondary | ICD-10-CM

## 2020-11-09 LAB — CBC
HCT: 23.3 % — ABNORMAL LOW (ref 36.0–46.0)
Hemoglobin: 7.3 g/dL — ABNORMAL LOW (ref 12.0–15.0)
MCH: 28.5 pg (ref 26.0–34.0)
MCHC: 31.3 g/dL (ref 30.0–36.0)
MCV: 91 fL (ref 80.0–100.0)
Platelets: 296 10*3/uL (ref 150–400)
RBC: 2.56 MIL/uL — ABNORMAL LOW (ref 3.87–5.11)
RDW: 16.3 % — ABNORMAL HIGH (ref 11.5–15.5)
WBC: 8.7 10*3/uL (ref 4.0–10.5)
nRBC: 0 % (ref 0.0–0.2)

## 2020-11-09 LAB — GLUCOSE, CAPILLARY: Glucose-Capillary: 107 mg/dL — ABNORMAL HIGH (ref 70–99)

## 2020-11-09 MED ORDER — ASPIRIN 81 MG PO CHEW
81.0000 mg | CHEWABLE_TABLET | Freq: Every day | ORAL | Status: DC
  Administered 2020-11-09: 81 mg via ORAL
  Filled 2020-11-09: qty 1

## 2020-11-09 MED ORDER — ASPIRIN 81 MG PO CHEW: 81.0000 mg | CHEWABLE_TABLET | Freq: Every day | ORAL | 0 refills | Status: DC

## 2020-11-09 MED ORDER — LOSARTAN POTASSIUM 50 MG PO TABS: 50.0000 mg | ORAL_TABLET | Freq: Every day | ORAL | Status: DC

## 2020-11-09 MED ORDER — LOSARTAN POTASSIUM 50 MG PO TABS: 50.0000 mg | ORAL_TABLET | Freq: Every day | ORAL | 0 refills | Status: DC

## 2020-11-09 MED ORDER — ROPINIROLE HCL 0.5 MG PO TABS: 0.5000 mg | ORAL_TABLET | Freq: Every day | ORAL | 0 refills | Status: DC

## 2020-11-09 NOTE — Discharge Instructions (Signed)
You are hospitalized due to difficulty with breathing.  While you were here, you received hemodialysis session and were able to complete a full session with improvement in your symptoms.  You also had an echocardiogram that was done and it showed that you have some decreased function of the pumping of your heart.  Cardiology saw you during this time and recommended to follow-up with them in the outpatient setting for possible cardiac catheterization procedure to determine if you have any coronary artery disease.  It is very important that she try to finish her hemodialysis sessions in their entirety in order to keep the excess fluid off of your body.  Your blood pressure medication amlodipine/Norvasc was discontinued upon discharge, cardiology instead recommended that you start losartan 50 mg daily.  Please make sure to follow-up with all of your physicians, and to contact your primary care physician for a visit within the next week.     Heart Failure, Diagnosis  Heart failure is a condition in which the heart has trouble pumping blood. This may mean that the heart cannot pump enough blood out to the body or that the heart does not fill up with enough blood. For some people with heart failure, fluid may back up into the lungs. There may also be swelling (edema) in the lower legs. Heart failure is usually a long-term (chronic) condition. It is important for you to take good care of yourself and follow the treatment plan from your health care provider. What are the causes? This condition may be caused by:  High blood pressure (hypertension). Hypertension causes the heart muscle to work harder than normal.  Coronary artery disease, or CAD. CAD is the buildup of cholesterol and fat (plaque) in the arteries of the heart.  Heart attack, also called myocardial infarction. This injures the heart muscle, making it hard for the heart to pump blood.  Abnormal heart valves. The valves do not open and close  properly, forcing the heart to pump harder to keep the blood flowing.  Heart muscle disease, inflammation, or infection (cardiomyopathy or myocarditis). This is damage to the heart muscle. It can increase the risk of heart failure.  Lung disease. The heart works harder when the lungs are not healthy. What increases the risk? The risk of heart failure increases as a person ages. This condition is also more likely to develop in people who:  Are obese.  Are female.  Use tobacco or nicotine products.  Abuse alcohol or drugs.  Have taken medicines that can damage the heart, such as chemotherapy drugs.  Have any of these conditions: ? Diabetes. ? Abnormal heart rhythms. ? Thyroid problems. ? Low blood counts (anemia). ? Chronic kidney disease.  Have a family history of heart failure. What are the signs or symptoms? Symptoms of this condition include:  Shortness of breath with activity, such as when climbing stairs.  A cough that does not go away.  Swelling of the feet, ankles, legs, or abdomen.  Losing or gaining weight for no reason.  Trouble breathing when lying flat.  Waking from sleep because of the need to sit up and get more air.  Rapid heartbeat.  Tiredness (fatigue) and loss of energy.  Feeling light-headed, dizzy, or close to fainting.  Nausea or loss of appetite.  Waking up more often during the night to urinate (nocturia).  Confusion. How is this diagnosed? This condition is diagnosed based on:  Your medical history, symptoms, and a physical exam.  Diagnostic tests, which may  include: ? Echocardiogram. ? Electrocardiogram (ECG). ? Chest X-ray. ? Blood tests. ? Exercise stress test. ? Cardiac MRI. ? Cardiac catheterization and angiogram. ? Radionuclide scans. How is this treated? Treatment for this condition is aimed at managing the symptoms of heart failure. Medicines Treatment may include medicines that:  Help lower blood pressure by relaxing  (dilating) the blood vessels. These medicines are called ACE inhibitors (angiotensin-converting enzyme), ARBs (angiotensin receptor blockers), or vasodilators.  Cause the kidneys to remove salt and water from the blood through urination (diuretics).  Improve heart muscle strength and prevent the heart from beating too fast (beta blockers).  Increase the force of the heartbeat (digoxin).  Lower heart rates. Certain diabetes medicines (SGLT-2 inhibitors) may also be used in treatment. Healthy behavior changes Treatment may also include making healthy lifestyle changes, such as:  Reaching and staying at a healthy weight.  Not using tobacco or nicotine products.  Eating heart-healthy foods.  Limiting or avoiding alcohol.  Stopping the use of illegal drugs.  Being physically active.  Participating in a cardiac rehabilitation program, which is a treatment program to improve your health and well-being through exercise training, education, and counseling. Other treatments Other treatments may include:  Procedures to open blocked arteries or repair damaged valves.  Placing a pacemaker to improve heart function (cardiac resynchronization therapy).  Placing a device to treat serious abnormal heart rhythms (implantable cardioverter defibrillator, or ICD).  Placing a device to improve the pumping ability of the heart (left ventricular assist device, or LVAD).  Receiving a healthy heart from a donor (heart transplant). This is done when other treatments have not helped. Follow these instructions at home:  Manage other health conditions as told by your health care provider. These may include hypertension, diabetes, thyroid disease, or abnormal heart rhythms.  Get ongoing education and support as needed. Learn as much as you can about heart failure.  Keep all follow-up visits. This is important. Summary  Heart failure is a condition in which the heart has trouble pumping blood.  This  condition is commonly caused by high blood pressure and other diseases of the heart and lungs.  Symptoms of this condition include shortness of breath, tiredness (fatigue), nausea, and swelling of the feet, ankles, legs, or abdomen.  Treatments for this condition may include medicines, lifestyle changes, and surgery.  Manage other health conditions as told by your health care provider. This information is not intended to replace advice given to you by your health care provider. Make sure you discuss any questions you have with your health care provider. Document Revised: 03/18/2020 Document Reviewed: 03/18/2020 Elsevier Patient Education  2021 [place].   Heart Failure, Self-Care Heart failure is a serious condition. The following information explains the things you need to do to take care of yourself after a heart failure diagnosis. You may be asked to change your diet, take certain medicines, and make other lifestyle changes in order to stay as healthy as possible. Your health care provider may also give you more specific instructions. If you have problems or questions, contact your health care provider. What are the risks? Having heart failure puts you at higher risk for certain problems. These problems can get worse if you do not take good care of yourself. Problems may include:  Damage to the kidneys, liver, or lungs.  Malnutrition.  Abnormal heart rhythms.  Blood clotting issues that could cause a stroke. Supplies needed:  Scale for monitoring weight.  Blood pressure monitor.  Notebook.  Medicines. How to care for yourself when you have heart failure Medicines Take over-the-counter and prescription medicines only as told by your health care provider. Medicines reduce the workload of your heart, slow the progression of heart failure, and improve symptoms. Take your medicines every day.  Do not stop taking your medicine unless your health care provider tells you to do  so.  Do not skip any dose of medicine.  Refill your prescriptions before you run out of medicine.  Talk with your health care provider if you cannot afford your medicines. Eating and drinking  Eat heart-healthy foods. Talk with a dietitian to make an eating plan that is right for you. ? Limit salt (sodium) if told by your health care provider. Sodium restriction may reduce symptoms of heart failure. Ask a dietitian to recommend heart-healthy seasonings. ? Use healthy cooking methods instead of frying. Healthy methods include roasting, grilling, broiling, baking, poaching, steaming, and stir-frying. ? Choose foods that contain no trans fat and are low in saturated fat and cholesterol. Healthy choices include fresh or frozen fruits and vegetables, fish, lean meats, legumes, fat-free or low-fat dairy products, and whole-grain or high-fiber foods.  Limit your fluid intake, if directed by your health care provider. Fluid restriction may reduce symptoms of heart failure.   Alcohol use  Do not drink alcohol if: ? Your health care provider tells you not to drink. ? Your heart was damaged by alcohol, or you have severe heart failure. ? You are pregnant, may be pregnant, or are planning to become pregnant.  If you drink alcohol: ? Limit how much you have to:  0-1 drink a day for women.  0-2 drinks a day for men. ? Know how much alcohol is in your drink. In the [place] one drink equals one 12 oz bottle of beer (355 mL), one 5 oz glass of wine (148 mL), or one 1 oz glass of hard liquor (44 mL). Lifestyle  Do not use any products that contain nicotine or tobacco. These products include cigarettes, chewing tobacco, and vaping devices, such as e-cigarettes. If you need help quitting, ask your health care provider. ? Do not use nicotine gum or patches before talking to your health care provider.  Do not use illegal drugs.  Work with your health care provider to safely reach the right body  weight.  Do physical activity if told by your health care provider. Talk to your health care provider before you begin an exercise if: ? You are an older adult. ? You have severe heart failure.  Learn to manage stress. If you need help to do this, ask your health care provider.  Participate in or seek physical rehabilitation as needed to keep or improve your independence and quality of life.  Participate in a cardiac rehabilitation program, which is a treatment program to improve your health and well-being through exercise training, education, and counseling.  Plan rest periods when you get tired.   Monitoring important information  Weigh yourself every day. This will help you to notice if too much fluid is building up in your body. ? Weigh yourself every morning after you urinate and before you eat breakfast. ? Wear the same amount of clothing each time you weigh yourself. ? Record your daily weight. Provide your health care provider with your weight record.  Monitor and record your pulse and blood pressure as told by your health care provider.   Dealing with extreme temperatures  If the weather is extremely hot: ?  Avoid vigorous physical activity. ? Use air conditioning or fans, or find a cooler location. ? Avoid caffeine and alcohol. ? Wear loose-fitting, lightweight, and light-colored clothing.  If the weather is extremely cold: ? Avoid vigorous activity. ? Layer your clothes. ? Wear mittens or gloves, a hat, and a face covering when you go outside. ? Avoid alcohol. Follow these instructions at home:  Stay up to date with vaccines. Pneumococcal and flu (influenza) vaccines are especially important in preventing infections of the airways.  Keep all follow-up visits. This is important. Contact a health care provider if you:  Gain 2-3 lb (1-1.4 kg) in 24 hours or 5 lb (2.3 kg) in a week.  Have increasing shortness of breath.  Are unable to participate in your usual  physical activities.  Get tired easily.  Cough more than normal, especially with physical activity.  Lose your appetite or feel nauseous.  Have any swelling or more swelling in areas such as your hands, feet, ankles, or abdomen.  Are unable to sleep because it is hard to breathe.  Feel like your heart is beating quickly (palpitations).  Become dizzy or light-headed when you stand up.  Have feelings of depression or sadness. Get help right away if you:  Have trouble breathing.  Notice, or your family notices, a change in your awareness, such as having trouble staying awake or concentrating.  Have pain or discomfort in your chest.  Have an episode of fainting (syncope). These symptoms may represent a serious problem that is an emergency. Do not wait to see if the symptoms will go away. Get medical help right away. Call your local emergency services (911 in the [place] Do not drive yourself to the hospital. Summary  Heart failure is a serious condition. To care for yourself, you may be asked to change your diet, take certain medicines, and make other lifestyle changes.  Take your medicines every day. Do not stop taking them unless your health care provider tells you to do so.  Limit salt and eat heart-healthy foods, such as fresh or frozen fruits and vegetables, fish, lean meats, legumes, fat-free or low-fat dairy products, and whole-grain or high-fiber foods.  Ask your health care provider if you have any alcohol restrictions. You may have to stop drinking alcohol if you have severe heart failure.  Contact your health care provider if you notice problems, such as rapid weight gain or a fast heartbeat. Get help right away if you faint or have chest pain or trouble breathing. This information is not intended to replace advice given to you by your health care provider. Make sure you discuss any questions you have with your health care provider. Document Revised: 03/18/2020 Document  Reviewed: 03/18/2020 Elsevier Patient Education  [place].   Heart Failure Eating Plan Heart failure, also called congestive heart failure, occurs when your heart does not pump blood well enough to meet your body's needs for oxygen-rich blood. Heart failure is a long-term (chronic) condition. Living with heart failure can be challenging. Following your health care provider's instructions about a healthy lifestyle and working with a dietitian to choose the right foods may help to improve your symptoms. An eating plan for someone with heart failure will include changes that limit the intake of salt (sodium) and unhealthy fat. What are tips for following this plan? Reading food labels  Check food labels for the amount of sodium per serving. Choose foods that have less than 140 mg (milligrams) of sodium in each  serving.  Check food labels for the number of calories per serving. This is important if you need to limit your daily calorie intake to lose weight.  Check food labels for the serving size. If you eat more than one serving, you will be eating more sodium and calories than what is listed on the label.  Look for foods that are labeled as "sodium-free," "very low sodium," or "low sodium." ? Foods labeled as "reduced sodium" or "lightly salted" may still have more sodium than what is recommended for you. Cooking  Avoid adding salt when cooking. Ask your health care provider or dietitian before using salt substitutes.  Season food with salt-free seasonings, spices, or herbs. Check the label of seasoning mixes to make sure they do not contain salt.  Cook with heart-healthy oils, such as olive, canola, soybean, or sunflower oil.  Do not fry foods. Cook foods using low-fat methods, such as baking, boiling, grilling, and broiling.  Limit unhealthy fats when cooking by: ? Removing the skin from poultry, such as chicken. ? Removing all visible fats from meats. ? Skimming the fat off  from stews, soups, and gravies before serving them. Meal planning  Limit your intake of: ? Processed, canned, or prepackaged foods. ? Foods that are high in trans fat, such as fried foods. ? Sweets, desserts, sugary drinks, and other foods with added sugar. ? Full-fat dairy products, such as whole milk.  Eat a balanced diet. This may include: ? 4-5 servings of fruit each day and 4-5 servings of vegetables each day. At each meal, try to fill one-half of your plate with fruits and vegetables. ? Up to 6-8 servings of whole grains each day. ? Up to 2 servings of lean meat, poultry, or fish each day. One serving of meat is equal to 3 oz (85 g). This is about the same size as a deck of cards. ? 2 servings of low-fat dairy each day. ? Heart-healthy fats. Healthy fats called omega-3 fatty acids are found in foods such as flaxseed and cold-water fish like sardines, salmon, and mackerel.  Aim to eat 25-35 g (grams) of fiber a day. Foods that are high in fiber include apples, broccoli, carrots, beans, peas, and whole grains.  Do not add salt or condiments that contain salt (such as soy sauce) to foods before eating.  When eating at a restaurant, ask that your food be prepared with less salt or no salt, if possible.  Try to eat 2 or more vegetarian meals each week.  Eat more home-cooked food and eat less restaurant, buffet, and fast food.   General information  Do not eat more than 2,300 mg of sodium a day. The amount of sodium that is recommended for you may be lower, depending on your condition.  Maintain a healthy body weight as directed. Ask your health care provider what a healthy weight is for you. ? Check your weight every day. ? Work with your health care provider and dietitian to make a plan that is right for you to lose weight or maintain your current weight.  Limit how much fluid you drink. Ask your health care provider or dietitian how much fluid you can have each day.  Limit or  avoid alcohol as told by your health care provider or dietitian. Recommended foods Fruits All fresh, frozen, and canned fruits. Dried fruits, such as raisins, prunes, and cranberries. Vegetables All fresh vegetables. Vegetables that are frozen without sauce or added salt. Low-sodium or sodium-free canned  vegetables. Grains Bread with less than 80 mg of sodium per slice. Whole-wheat pasta, quinoa, and brown rice. Oats and oatmeal. Barley. [place]. Grits and cream of wheat. Whole-grain and whole-wheat cold cereal. Meats and other protein foods Lean cuts of meat. Skinless chicken and [place]. Fish with high omega-3 fatty acids, such as salmon, sardines, and other cold-water fishes. Eggs. Dried beans, peas, and edamame. Unsalted nuts and nut butters. Dairy Low-fat or nonfat (skim) milk and dried milk. Rice milk, soy milk, and almond milk. Low-fat or nonfat yogurt. Small amounts of reduced-sodium block cheese. Low-sodium cottage cheese. Fats and oils Olive, canola, soybean, flaxseed, avocado, or sunflower oil. Sweets and desserts Applesauce. Granola bars. Sugar-free pudding and gelatin. Frozen fruit bars. Seasoning and other foods Fresh and dried herbs. Lemon or lime juice. Vinegar. Low-sodium ketchup. Salt-free marinades, salad dressings, sauces, and seasonings. The items listed above may not be a complete list of foods and beverages you can eat. Contact a dietitian for more information. Foods to avoid Fruits Fruits that are dried with sodium-containing preservatives. Vegetables Canned vegetables. Frozen vegetables with sauce or seasonings. Creamed vegetables. [place] fries. Onion rings. Pickled vegetables and sauerkraut. Grains Bread with more than 80 mg of sodium per slice. Hot or cold cereal with more than 140 mg sodium per serving. Salted pretzels and crackers. Prepackaged breadcrumbs. Bagels, croissants, and biscuits. Meats and other protein foods Ribs and chicken wings. Bacon, ham,  pepperoni, bologna, salami, and packaged luncheon meats. Hot dogs, bratwurst, and sausage. Canned meat. Smoked meat and fish. Salted nuts and seeds. Dairy Whole milk, half-and-half, and cream. Buttermilk. Processed cheese, cheese spreads, and cheese curds. Regular cottage cheese. Feta cheese. Shredded cheese. String cheese. Fats and oils Butter, lard, shortening, ghee, and bacon fat. Canned and packaged gravies. Seasoning and other foods Onion salt, garlic salt, table salt, and sea salt. Marinades. Regular salad dressings. Relishes, pickles, and olives. Meat flavorings and tenderizers, and bouillon cubes. Horseradish, ketchup, and mustard. Worcestershire sauce. Teriyaki sauce, soy sauce (including reduced sodium). Hot sauce and Tabasco sauce. Steak sauce, fish sauce, oyster sauce, and cocktail sauce. Taco seasonings. Barbecue sauce. Tartar sauce. The items listed above may not be a complete list of foods and beverages you should avoid. Contact a dietitian for more information. Summary  A heart failure eating plan includes changes that limit your intake of sodium and unhealthy fat, and it may help you lose weight or maintain a healthy weight. Your health care provider may also recommend limiting how much fluid you drink.  Most people with heart failure should eat no more than 2,300 mg of salt (sodium) a day. The amount of sodium that is recommended for you may be lower, depending on your condition.  Contact your health care provider or dietitian before making any major changes to your diet. This information is not intended to replace advice given to you by your health care provider. Make sure you discuss any questions you have with your health care provider. Document Revised: 04/10/2020 Document Reviewed: 04/10/2020 Elsevier Patient Education  2021 [employer].

## 2020-11-09 NOTE — Consult Note (Addendum)
Cardiology Consultation:   Patient ID: Tina Gomez MRN: [id number]; DOB: [birth year]  Admit date: 11/07/2020 Date of Consult: 11/09/2020  PCP:  Charlott Rakes, MD   [place]  Cardiologist:  Kirk Ruths, MD  Advanced Practice Provider:  No care team member to display Electrophysiologist:  None  [phone]  Patient Profile:   Tina Gomez is a 65 y.o. female with a hx of stroke, COPD, DM, HTN, HLD, RLS, DM (diet controlled), anemia and ESRD on HD who is being seen today for the evaluation of CHF at the request of Dr. McDiarmid.   History of Present Illness:   Tina Gomez is a 65 yo female with PMH noted above.  Evaluated by cardiology in the past.  Patient reports she has been on hemodialysis since back in November 2021.  This was precipitated by several weeks of intense nausea and vomiting which ultimately led to acute renal failure.  She has been following with nephrology and on a Monday Wednesday Friday schedule for her HD.  In talking with patient seems like she has been struggling with episodes of hypotension with her HD. Typically feels very "washed out" after sessions.  Also has episodes of shortness of breath as well.  Sometimes will have some fullness in her chest but no associated chest pain.  She currently lives with her daughter and son who has autism.  Daughter typically helps with most of her ADLs.  States she experiences fatigue and shortness of breath with very minimal activity which seems to have started around the time she initiated HD.  Developed worsening shortness of breath about 1 week prior to admission.  States she normally has trouble sitting for her entire HD sessions due to hypotension, shortness of breath and restless leg syndrome. She has had trouble with her left upper arm AV fistula with clotting and recently had a revision on 2/24.  Dialysis has been done through her HD catheter right upper chest.  On the day of admission she presented  with worsening shortness of breath, states symptoms have intensified that day and she could feel herself breathing harder than usual.  Informed her daughter of symptoms who brought her to the ED for further evaluation.  In the ED her labs showed Na+ 141, potassium 3.4, creatinine 3.9, BNP 1369, high-sensitivity troponin 78>>79, WBC 8.7, hemoglobin 7.7.  Chest x-ray with mild interstitial edema with bibasilar atelectasis and small right effusion. EKG 76 bpm 1st degree AVB, TWI in lateral leads. Was treated with nebs and steroids with improvement in dyspnea. She was admitted to IM for further management. Nephrology consulted for HD needs. Actually underwent HD session yesterday after admission and says she felt the best she has in a long time.     Past Medical History:  Diagnosis Date  . Anemia   . Chronic kidney disease   . COPD (chronic obstructive pulmonary disease) ([place]) 05/2019   no inhaler  . Diabetes mellitus without complication ([place])    no meds - diet controlled  . History of blood transfusion 09/2020   1 unit  . HLD (hyperlipidemia)   . Hypertension   . PONV (postoperative nausea and vomiting)   . Restless legs syndrome (RLS)   . Stroke [facility])     Past Surgical History:  Procedure Laterality Date  . AV FISTULA PLACEMENT Left 07/27/2020   Procedure: LEFT BRACHIO-BASILIC ARTERIOVENOUS (AV) FISTULA CREATION;  Surgeon: Cherre Robins, MD;  Location: [place];  Service: Vascular;  Laterality: Left;  .  BASCILIC VEIN TRANSPOSITION Left 11/02/2020   Procedure: LEFT UPPER ARM SECOND STAGE BASILIC VEIN TRANSPOSITION;  Surgeon: Cherre Robins, MD;  Location: [place];  Service: Vascular;  Laterality: Left;  PERIPHERAL NERVE BLOCK  . CESAREAN SECTION     x 1  . ESOPHAGOGASTRODUODENOSCOPY (EGD) WITH PROPOFOL N/A 05/26/2020   Procedure: ESOPHAGOGASTRODUODENOSCOPY (EGD) WITH PROPOFOL;  Surgeon: Irene Shipper, MD;  Location[facility] ENDOSCOPY;  Service: Endoscopy;  Laterality: N/A;  . INSERTION OF  DIALYSIS CATHETER Right 07/27/2020   Procedure: INSERTION OF RIGHT INTERNAL JUGULAR TUNNELED DIALYSIS CATHETER;  Surgeon: Cherre Robins, MD;  Location: [facility];  Service: Vascular;  Laterality: Right;  . IR FLUORO GUIDE CV LINE RIGHT  07/25/2020  . IR US GUIDE VASC ACCESS RIGHT  07/25/2020  . TONSILLECTOMY    . UPPER GI ENDOSCOPY     growth removed from voice box  . WRIST SURGERY Left    ganglion cyst removal     Home Medications:  Prior to Admission medications   Medication Sig Start Date End Date Taking? Authorizing Provider  amLODipine (NORVASC) 10 MG tablet Take 1 tablet (10 mg total) by mouth daily. 08/01/20 08/31/20 Yes Dahal, Marlowe Aschoff, MD  aspirin 325 MG tablet Take 1 tablet (325 mg total) by mouth daily. 11/25/18  Yes McClung, Angela M, PA-C  atorvastatin (LIPITOR) 40 MG tablet TAKE 1 TABLET BY MOUTH DAILY AT 6 PM. Patient taking differently: Take 40 mg by mouth every evening. 05/23/20  Yes Newlin, Charlane Ferretti, MD  B Complex-C-Zn-Folic Acid (DIALYVITE 474 WITH ZINC) 0.8 MG TABS Take 1 tablet by mouth at bedtime. 08/12/20  Yes [provider]  Blood Glucose Monitoring Suppl (BLOOD GLUCOSE METER) kit Use as instructed 11/01/13  Yes Reyne Dumas, MD  Blood Glucose Monitoring Suppl (TRUE METRIX METER) DEVI 1 each by Does not apply route 3 (three) times daily before meals. 06/02/20  Yes Charlott Rakes, MD  calcium acetate (PHOSLO) 667 MG capsule Take 2,001 mg by mouth 3 (three) times daily with meals. 09/19/20  Yes [provider]  carvedilol (COREG) 12.5 MG tablet Take 1 tablet (12.5 mg total) by mouth 2 (two) times daily with a meal. 06/15/20  Yes Newlin, Enobong, MD  glucose blood (TRUETEST TEST) test strip Use as instructed 06/02/20  Yes Newlin, Enobong, MD  hydrALAZINE (APRESOLINE) 25 MG tablet Take 1 tablet (25 mg total) by mouth every 8 (eight) hours. 07/31/20 08/30/20 Yes Dahal, Marlowe Aschoff, MD  Lancets (FREESTYLE) lancets Use as instructed 11/01/13  Yes Reyne Dumas, MD   metoCLOPramide (REGLAN) 5 MG tablet TAKE 1 TABLET (5 MG TOTAL) BY MOUTH 4 (FOUR) TIMES DAILY - BEFORE MEALS AND AT BEDTIME. Patient taking differently: Take 5 mg by mouth in the morning and at bedtime. 09/19/20  Yes Willia Craze, NP  rOPINIRole (REQUIP) 2 MG tablet Take 2 mg by mouth at bedtime.   Yes [provider]  TRUEplus Lancets 28G MISC 1 each by Does not apply route 3 (three) times daily before meals. 06/02/20  Yes Charlott Rakes, MD    Inpatient Medications: Scheduled Meds: . amLODipine  10 mg Oral Daily  . atorvastatin  40 mg Oral QPM  . calcium acetate  2,001 mg Oral TID WC  . carvedilol  12.5 mg Oral QHS  . Chlorhexidine Gluconate Cloth  6 each Topical Q0600  . darbepoetin (ARANESP) injection - DIALYSIS  100 mcg Intravenous Q Wed-HD  . doxercalciferol  2 mcg Intravenous Q M,W,F-HD  . heparin  5,000 Units Subcutaneous  Q8H  . metoCLOPramide  5 mg Oral BID  . rOPINIRole  0.5 mg Oral Daily  . rOPINIRole  2 mg Oral QHS   Continuous Infusions:  PRN Meds: acetaminophen **OR** acetaminophen  Allergies:    Allergies  Allergen Reactions  . Codeine Nausea And Vomiting  . Adhesive [Tape] Other (See Comments)    Tape breaks out the skin if it is left on for a lengthy period of time    Social History:   Social History   Socioeconomic History  . Marital status: Divorced    Spouse name: Not on file  . Number of children: Not on file  . Years of education: Not on file  . Highest education level: Not on file  Occupational History  . Not on file  Tobacco Use  . Smoking status: Current Every Day Smoker    Packs/day: 0.25    Years: 10.00    Pack years: 2.50    Types: Cigarettes  . Smokeless tobacco: Never Used  . Tobacco comment: 5 cigs daily  Vaping Use  . Vaping Use: Never used  Substance and Sexual Activity  . Alcohol use: No  . Drug use: No  . Sexual activity: Not on file  Other Topics Concern  . Not on file  Social History Narrative  . Not on  file   Social Determinants of Health   Financial Resource Strain: Not on file  Food Insecurity: Not on file  Transportation Needs: Not on file  Physical Activity: Not on file  Stress: Not on file  Social Connections: Not on file  Intimate Partner Violence: Not on file    Family History:    Family History  Problem Relation Age of Onset  . Stroke Mother   . Hypertension Mother   . Hyperlipidemia Mother   . Hypertension Father   . Hyperlipidemia Father      ROS:  Please see the history of present illness.   All other ROS reviewed and negative.     Physical Exam/Data:   Vitals:   11/08/20 2017 11/09/20 0014 11/09/20 0357 11/09/20 0808  BP: (!) 142/56 (!) 140/50 (!) 149/57 (!) 151/57  Pulse: 66 62 64 (!) 59  Resp: 18 16 20 20   Temp: 98.4 F (36.9 C) 98.3 F (36.8 C) 98.1 F (36.7 C) 97.7 F (36.5 C)  TempSrc: Oral Oral Oral Oral  SpO2: 95% 93% 95% 92%  Weight:  75.8 kg      Intake/Output Summary (Last 24 hours) at 11/09/2020 1245 Last data filed at 11/09/2020 0845 Gross per 24 hour  Intake 1320 ml  Output 0 ml  Net 1320 ml   Last 3 Weights 11/09/2020 11/08/2020 11/08/2020  Weight (lbs) 167 lb 3.2 oz 163 lb 2.3 oz 169 lb 12.1 oz  Weight (kg) 75.841 kg 74 kg 77 kg     Body mass index is 30.58 kg/m.  General:  Well nourished, well developed, in no acute distress HEENT: normal Lymph: no adenopathy Neck: no JVD Endocrine:  No thryomegaly Vascular: No carotid bruits Cardiac:  normal S1, S2; RRR; no murmur  Lungs:  clear to auscultation bilaterally, no wheezing, rhonchi or rales  Abd: soft, nontender, no hepatomegaly  Ext: no edema Musculoskeletal:  No deformities, BUE and BLE strength normal and equal. HD cath to R upper chest. AV fistula in LUE Skin: warm and dry  Neuro:  CNs 2-12 intact, no focal abnormalities noted Psych:  Normal affect   EKG:  The EKG was personally reviewed  and demonstrates:  76 bpm 1st degree AVB, TWI in lateral leads  Relevant CV  Studies:  Echo: 11/08/20  IMPRESSIONS    1. Left ventricular ejection fraction, by estimation, is 30%. The left  ventricle has moderate to severely decreased function. The left ventricle  demonstrates global hypokinesis. The left ventricular internal cavity size  was mildly dilated. There is mild  left ventricular hypertrophy. Left ventricular diastolic parameters are  consistent with Grade II diastolic dysfunction (pseudonormalization).  2. Right ventricular systolic function is mildly reduced. The right  ventricular size is normal. Tricuspid regurgitation signal is inadequate  for assessing PA pressure.  3. Left atrial size was moderately dilated.  4. Right atrial size was mildly dilated.  5. The mitral valve is normal in structure. Mild mitral valve  regurgitation. No evidence of mitral stenosis.  6. The aortic valve is tricuspid. Aortic valve regurgitation is not  visualized. No aortic stenosis is present.  7. The inferior vena cava is dilated in size with >50% respiratory  variability, suggesting right atrial pressure of 8 mmHg.   Laboratory Data:  High Sensitivity Troponin:   Recent Labs  Lab 11/07/20 1215 11/07/20 1424  TROPONINIHS 78* 79*     Chemistry Recent Labs  Lab 11/07/20 1215 11/08/20 0442  NA 141 137  K 3.4* 3.6  CL 98 99  CO2 30 24  GLUCOSE 158* 156*  BUN 23 33*  CREATININE 3.97* 4.64*  CALCIUM 8.5* 8.4*  GFRNONAA 12* 10*  ANIONGAP 13 14    Recent Labs  Lab 11/07/20 1215  PROT 5.9*  ALBUMIN 2.8*  AST 15  ALT <5  ALKPHOS 65  BILITOT 0.5   Hematology Recent Labs  Lab 11/07/20 1215 11/08/20 0442 11/09/20 0351  WBC 8.7 8.8 8.7  RBC 2.82* 2.47* 2.56*  HGB 7.7* 7.1* 7.3*  HCT 26.2* 22.2* 23.3*  MCV 92.9 89.9 91.0  MCH 27.3 28.7 28.5  MCHC 29.4* 32.0 31.3  RDW 16.7* 16.4* 16.3*  PLT 322 273 296   BNP Recent Labs  Lab 11/07/20 1215  BNP 1,369.1*    DDimer No results for input(s): DDIMER in the last 168  hours.   Radiology/Studies:  DG Chest Port 1 View  Result Date: 11/07/2020 CLINICAL DATA:  Shortness of breath EXAM: PORTABLE CHEST 1 VIEW COMPARISON:  07/27/2020 FINDINGS: Right dialysis catheter remains in place, unchanged. Cardiomegaly. Diffuse interstitial prominence throughout the lungs, likely interstitial edema. Bibasilar opacities, favor atelectasis. Suspect small right pleural effusion. IMPRESSION: Findings most compatible with mild interstitial edema. Bibasilar atelectasis and small right effusion. Electronically Signed   By: Rolm Baptise M.D.   On: 11/07/2020 11:21   ECHOCARDIOGRAM COMPLETE  Result Date: 11/08/2020    ECHOCARDIOGRAM REPORT   Patient Name:   Tina Gomez Date of Exam: 11/08/2020 Medical Rec #:  [id number]      Height:       62.0 in Accession #:    [id number]     Weight:       163.1 lb Date of Birth:  1956/07/22      BSA:          1.753 m Patient Age:    55 years       BP:           149/64 mmHg Patient Gender: F              HR:           63 bpm. Exam Location:  Inpatient Procedure: 2D Echo,  Cardiac Doppler and Color Doppler Indications:    Abnormal ECG R94.31  History:        Patient has prior history of Echocardiogram examinations, most                 recent 10/23/2013. Stroke and COPD; Risk Factors:Hypertension,                 Diabetes and Current Smoker. CKD.  Sonographer:    Vickie Epley RDCS Referring Phys: [place]  1. Left ventricular ejection fraction, by estimation, is 30%. The left ventricle has moderate to severely decreased function. The left ventricle demonstrates global hypokinesis. The left ventricular internal cavity size was mildly dilated. There is mild  left ventricular hypertrophy. Left ventricular diastolic parameters are consistent with Grade II diastolic dysfunction (pseudonormalization).  2. Right ventricular systolic function is mildly reduced. The right ventricular size is normal. Tricuspid regurgitation signal is inadequate for  assessing PA pressure.  3. Left atrial size was moderately dilated.  4. Right atrial size was mildly dilated.  5. The mitral valve is normal in structure. Mild mitral valve regurgitation. No evidence of mitral stenosis.  6. The aortic valve is tricuspid. Aortic valve regurgitation is not visualized. No aortic stenosis is present.  7. The inferior vena cava is dilated in size with >50% respiratory variability, suggesting right atrial pressure of 8 mmHg. FINDINGS  Left Ventricle: Left ventricular ejection fraction, by estimation, is 30%. The left ventricle has moderate to severely decreased function. The left ventricle demonstrates global hypokinesis. The left ventricular internal cavity size was mildly dilated. There is mild left ventricular hypertrophy. Left ventricular diastolic parameters are consistent with Grade II diastolic dysfunction (pseudonormalization). Right Ventricle: The right ventricular size is normal. No increase in right ventricular wall thickness. Right ventricular systolic function is mildly reduced. Tricuspid regurgitation signal is inadequate for assessing PA pressure. Left Atrium: Left atrial size was moderately dilated. Right Atrium: Right atrial size was mildly dilated. Pericardium: Trivial pericardial effusion is present. Mitral Valve: The mitral valve is normal in structure. Mild mitral valve regurgitation. No evidence of mitral valve stenosis. Tricuspid Valve: The tricuspid valve is normal in structure. Tricuspid valve regurgitation is not demonstrated. Aortic Valve: The aortic valve is tricuspid. Aortic valve regurgitation is not visualized. No aortic stenosis is present. Pulmonic Valve: The pulmonic valve was normal in structure. Pulmonic valve regurgitation is trivial. Aorta: The aortic root is normal in size and structure. Venous: The inferior vena cava is dilated in size with greater than 50% respiratory variability, suggesting right atrial pressure of 8 mmHg. IAS/Shunts: No atrial  level shunt detected by color flow Doppler.  LEFT VENTRICLE PLAX 2D LVIDd:         6.10 cm      Diastology LVIDs:         4.90 cm      LV e' medial:    4.05 cm/s LV PW:         1.10 cm      LV E/e' medial:  25.7 LV IVS:        1.10 cm      LV e' lateral:   5.55 cm/s LVOT diam:     2.40 cm      LV E/e' lateral: 18.7 LV SV:         90 LV SV Index:   51 LVOT Area:     4.52 cm  LV Volumes (MOD) LV vol d, MOD A2C: 213.0 ml LV  vol d, MOD A4C: 217.0 ml LV vol s, MOD A2C: 130.0 ml LV vol s, MOD A4C: 135.0 ml LV SV MOD A2C:     83.0 ml LV SV MOD A4C:     217.0 ml LV SV MOD BP:      89.1 ml RIGHT VENTRICLE RV S prime:     9.55 cm/s TAPSE (M-mode): 2.5 cm LEFT ATRIUM             Index       RIGHT ATRIUM           Index LA diam:        4.80 cm 2.74 cm/m  RA Area:     19.00 cm LA Vol (A2C):   67.0 ml 38.22 ml/m RA Volume:   56.70 ml  32.34 ml/m LA Vol (A4C):   74.5 ml 42.50 ml/m LA Biplane Vol: 73.0 ml 41.64 ml/m  AORTIC VALVE LVOT Vmax:   88.30 cm/s LVOT Vmean:  57.200 cm/s LVOT VTI:    0.199 m  AORTA Ao Root diam: 3.50 cm Ao Asc diam:  3.60 cm MITRAL VALVE MV Area (PHT): 3.91 cm     SHUNTS MV Decel Time: 194 msec     Systemic VTI:  0.20 m MV E velocity: 104.00 cm/s  Systemic Diam: 2.40 cm MV A velocity: 73.70 cm/s MV E/A ratio:  1.41 Tina Champagne MD Electronically signed by Tina Champagne MD Signature Date/Time: 11/08/2020/4:55:13 PM    Final      Assessment and Plan:   Tina Gomez is a 65 y.o. female with a hx of stroke, COPD, DM, HTN, RLS, DM (diet controlled), anemia and ESRD on HD who is being seen today for the evaluation of CHF at the request of Dr. McDiarmid.   1. Dyspnea: presented with progressive dyspnea over the past week. Multifactorial in the setting of volume overload (in setting leaving HD early) and COPD. Did note improvement in symptoms after receiving nebs and steroids in the ED. Has been weaned from O2.   2. Acute systolic CHF: EF noted at 30% with diffuse hypokinesis. Volume management  per HD. She is significantly better after having had HD yesterday. Given decline in EF will need further work up, as she does have RFs including HTN, HLD, DM and tobacco use.  -- She has not had any anginal symptoms per say but with RF needs evaluation. Discussed with MD and would plan to see back in the office and set up for out patient cardiac catheterization as she is quite anxious to get home to care for her son who is autistic  -- continue ASA and statin -- would continue coreg 12.80m BID -- stop amlodipine 133m-- volume management per HD  3. ESRD on HD: fairly new, started back in November and has had a tough time adjusting  -- nephrology following with recommendation of 76kg for dry weight  4. HTN: blood pressures elevated at times. She struggled with hypotension with her HD sessions and adjusted to taking meds in the afternoon on HD days.  -- coreg 12.39m10mID -- stop amlodipine   5. Anemia: suspect due to chronic kidney disease. No reports of bleeding issues prior to admission -- Hgb 7.7>>7.1>>7.3 -- follow CBC  6. COPD with hx of tobacco use: says she does not have a formal dx but symptoms improved in the ED with nebs and steriods -- per primary  7. DM: reports this has been diet controlled most recently. Hgb A1c 6.4 (  no home medications)  8. RLS: on requip PTA  Risk Assessment/Risk Scores:   New York Heart Association (NYHA) Functional Class NYHA Class III   For questions or updates, please contact [facility] Please consult [email or web address] for contact info under    Signed, Reino Bellis, NP  11/09/2020 12:45 PM As above, patient seen and examined.  Briefly she is a 65 year old female with past medical history of diabetes mellitus, hypertension, hyperlipidemia, COPD, prior CVA, end-stage renal disease dialysis dependent for evaluation of cardiomyopathy and congestive heart failure.  Patient initiated on dialysis last fall.  She does have some dyspnea on exertion at  baseline.  She states over the preceding week her dyspnea on exertion worsened and progressed orthopnea.  She denies pedal edema.  She has had no exertional chest pain.  She was admitted and has been dialyzed with improvement in symptoms.  Echocardiogram shows ejection fraction 30%, mild left ventricular enlargement, mild left ventricular hypertrophy, grade 2 diastolic dysfunction, mild RV dysfunction, biatrial enlargement, mild mitral regurgitation.  Cardiology now asked to evaluate. BNP 1369.  Troponin 78 and 79.  Hemoglobin 7.3.  Electrocardiogram shows sinus rhythm, first-degree AV block, cannot rule out prior septal infarct, lateral T wave inversion.  1 acute systolic congestive heart failure-symptoms have improved with dialysis.  Continue volume management per dialysis.  2 cardiomyopathy-etiology unclear.  Cardiac risk factors including diabetes mellitus, tobacco abuse, hypertension and hyperlipidemia.  She will ultimately require cardiac catheterization to exclude coronary disease.  However she has an autistic son at home and would prefer not to stay in the hospital for now.  I do not think this is an urgent issue.  She can be discharged and we will arrange follow-up visit with APP 1 to 2 weeks and catheterization can be arranged as an outpatient at that time.  Would also check TSH at that time.  We will continue carvedilol but discontinue amlodipine.  Add losartan 50 mg daily.  Follow blood pressure and titrate medications as tolerated.  Once fully titrated and revascularization if needed would repeat echocardiogram 3 months later to see if LV function has improved.  Note we will check potassium and renal function in 1 week.  3 hypertension-as outlined above we will discontinue amlodipine and instead treat with losartan given cardiomyopathy.  Follow blood pressure as an outpatient and adjust regimen as needed.  4 hyperlipidemia-continue Lipitor.  5 tobacco abuse-patient counseled on  discontinuing.  6 end-stage renal disease-dialysis per nephrology.  7 anemia-patient will follow up with primary care for this issue.  Kirk Ruths, MD

## 2020-11-09 NOTE — Progress Notes (Addendum)
Family Medicine Teaching Service Daily Progress Note Intern Pager: [phone]  Patient name: Tina Gomez Medical record number: [id number] Date of birth: [birth year] Age: 65 y.o. Gender: female  Primary Care Provider: Charlott Rakes, MD Consultants: Nephrology, Cardiology Code Status: Full  Pt Overview and Major Events to Date:  3/1 - Admitted  3/2 - HD, tolerated full session. Echo EF 30% 3/3 - Cardiology consulted  Assessment and Plan: Tina Gomez is a 65 y.o. female presenting with worsening shortness of breath x1 week with concern for heart failure. PMH is significant for COPD, T2DM, ESRD on HD MWF, HTN, HLD, anemia (previously requiring transfusion), history of CVA with residual right-sided weakness  Dyspnea  HFrEF Echo with EF 30%, LV with severely decreased function with global hypokinesis, grade II diastolic dysfunction, RA pressure estimated at 71mHg. Patient remains without oxygen requirement. Weight today is 75.8kg (down from 76kg prior to HD). As patient does not make much urine, volume will be managed via HD.  - Cardiology consulted for recommendations  - Continuous pulse ox  - supplemental O2 to maintain O2 sats >88% - Strict I's and O's - Daily weights  Abnormal ECG, stable ECG on admission significant for T wave inversions in the lateral leads (V4/V5/V6). Repeat ECG on 3/2 showed persistent T wave inversion in V5/V6, no STEMI. Troponins were trended flat on admission.  - Cardiology consulted - Continue to monitor for development of cardiac symptoms  ESRD on HD (MWF) Patient tolerated full HD session with small Requip dose in the AM. Current weight 75.8kg Patient unsure of dry weight, using Dr. GShelva Majesticrecommendation of [phone] - Monitor for symptom improvement after HD session - Continue home PhosLo  Restless leg syndrome Patient able to tolerate full HD sessions with 0.'5mg'$  dose of Requip. Max dose is '3mg'$  daily and she is currently at 2.'5mg'$  daily.  -  Continue home requip nightly - Continue new daily dose of 0.'5mg'$    Anemia, likely 2/2 chronic kidney disease Hgb 7.7 > 7.1 >7.3. Transfusion threshold of 7. Patient received Epo in HD on 3/2. - Continue to monitor with CBC  HTN BP in the last 24h: 139-157/50-81.  - Continue home amlodipine - Monitor for leg swelling  Concern for COPD  Hx of tobacco use Patient not yet diagnosed with COPD, though prior hospitalizations have stated it as a diagnosis.  - Recommend outpatient PFTs - Continuous pulse ox - Supplemental O2 to maintain sats >88%    FEN/GI: renal with fluid restriction 12048mPPx: Lovenox   Status is: Observation  The patient remains OBS appropriate and will d/c before 2 midnights.  Dispo: The patient is from: Home              Anticipated d/c is to: Home              Patient currently is not medically stable to d/c.   Difficult to place patient No   Subjective:  Patient reports that she feels back to normal today and still has not had any further dyspnea episodes. She is concerned about how long she must stay in the hospital as she has an adult son with autism that gets very worried when she is in the hospital.  Objective: Temp:  [97.7 F (36.5 C)-98.7 F (37.1 C)] 97.7 F (36.5 C) (03/03 0808) Pulse Rate:  [59-66] 59 (03/03 0808) Resp:  [12-20] 20 (03/03 0808) BP: (139-157)/(50-81) 151/57 (03/03 0808) SpO2:  [92 %-97 %] 92 % (03/03 0808) Weight:  [74 kg-75.8  kg] 75.8 kg (03/03 0014) Physical Exam: General: NAD, sitting up in bed, well-appearing Cardiovascular: RRR, no m/r/g appreciated at time of exam Respiratory: CTAB, no increased WOB, comfortable on room air Abdomen: soft, non-tender, non-distended Extremities: moving all appropriately, up walking in the room.  Laboratory: Recent Labs  Lab 11/07/20 1215 11/08/20 0442 11/09/20 0351  WBC 8.7 8.8 8.7  HGB 7.7* 7.1* 7.3*  HCT 26.2* 22.2* 23.3*  PLT 322 273 296   Recent Labs  Lab  11/02/20 1033 11/07/20 1215 11/08/20 0442  NA 139 141 137  K 3.6 3.4* 3.6  CL 98 98 99  CO2  --  30 24  BUN 26* 23 33*  CREATININE 4.50* 3.97* 4.64*  CALCIUM  --  8.5* 8.4*  PROT  --  5.9*  --   BILITOT  --  0.5  --   ALKPHOS  --  65  --   ALT  --  <5  --   AST  --  15  --   GLUCOSE 100* 158* 156*    Imaging/Diagnostic Tests: ECHOCARDIOGRAM COMPLETE  Result Date: 11/08/2020    ECHOCARDIOGRAM REPORT   Patient Name:   Tina Gomez Date of Exam: 11/08/2020 Medical Rec #:  [id number]      Height:       62.0 in Accession #:    [id number]     Weight:       163.1 lb Date of Birth:  28-Feb-1956      BSA:          1.753 m Patient Age:    27 years       BP:           149/64 mmHg Patient Gender: F              HR:           63 bpm. Exam Location:  Inpatient Procedure: 2D Echo, Cardiac Doppler and Color Doppler Indications:    Abnormal ECG R94.31  History:        Patient has prior history of Echocardiogram examinations, most                 recent 10/23/2013. Stroke and COPD; Risk Factors:Hypertension,                 Diabetes and Current Smoker. CKD.  Sonographer:    Vickie Epley RDCS Referring Phys: [place]  1. Left ventricular ejection fraction, by estimation, is 30%. The left ventricle has moderate to severely decreased function. The left ventricle demonstrates global hypokinesis. The left ventricular internal cavity size was mildly dilated. There is mild  left ventricular hypertrophy. Left ventricular diastolic parameters are consistent with Grade II diastolic dysfunction (pseudonormalization).  2. Right ventricular systolic function is mildly reduced. The right ventricular size is normal. Tricuspid regurgitation signal is inadequate for assessing PA pressure.  3. Left atrial size was moderately dilated.  4. Right atrial size was mildly dilated.  5. The mitral valve is normal in structure. Mild mitral valve regurgitation. No evidence of mitral stenosis.  6. The aortic valve is  tricuspid. Aortic valve regurgitation is not visualized. No aortic stenosis is present.  7. The inferior vena cava is dilated in size with >50% respiratory variability, suggesting right atrial pressure of 8 mmHg. FINDINGS  Left Ventricle: Left ventricular ejection fraction, by estimation, is 30%. The left ventricle has moderate to severely decreased function. The left ventricle demonstrates global hypokinesis. The left ventricular internal  cavity size was mildly dilated. There is mild left ventricular hypertrophy. Left ventricular diastolic parameters are consistent with Grade II diastolic dysfunction (pseudonormalization). Right Ventricle: The right ventricular size is normal. No increase in right ventricular wall thickness. Right ventricular systolic function is mildly reduced. Tricuspid regurgitation signal is inadequate for assessing PA pressure. Left Atrium: Left atrial size was moderately dilated. Right Atrium: Right atrial size was mildly dilated. Pericardium: Trivial pericardial effusion is present. Mitral Valve: The mitral valve is normal in structure. Mild mitral valve regurgitation. No evidence of mitral valve stenosis. Tricuspid Valve: The tricuspid valve is normal in structure. Tricuspid valve regurgitation is not demonstrated. Aortic Valve: The aortic valve is tricuspid. Aortic valve regurgitation is not visualized. No aortic stenosis is present. Pulmonic Valve: The pulmonic valve was normal in structure. Pulmonic valve regurgitation is trivial. Aorta: The aortic root is normal in size and structure. Venous: The inferior vena cava is dilated in size with greater than 50% respiratory variability, suggesting right atrial pressure of 8 mmHg. IAS/Shunts: No atrial level shunt detected by color flow Doppler.  LEFT VENTRICLE PLAX 2D LVIDd:         6.10 cm      Diastology LVIDs:         4.90 cm      LV e' medial:    4.05 cm/s LV PW:         1.10 cm      LV E/e' medial:  25.7 LV IVS:        1.10 cm      LV e'  lateral:   5.55 cm/s LVOT diam:     2.40 cm      LV E/e' lateral: 18.7 LV SV:         90 LV SV Index:   51 LVOT Area:     4.52 cm  LV Volumes (MOD) LV vol d, MOD A2C: 213.0 ml LV vol d, MOD A4C: 217.0 ml LV vol s, MOD A2C: 130.0 ml LV vol s, MOD A4C: 135.0 ml LV SV MOD A2C:     83.0 ml LV SV MOD A4C:     217.0 ml LV SV MOD BP:      89.1 ml RIGHT VENTRICLE RV S prime:     9.55 cm/s TAPSE (M-mode): 2.5 cm LEFT ATRIUM             Index       RIGHT ATRIUM           Index LA diam:        4.80 cm 2.74 cm/m  RA Area:     19.00 cm LA Vol (A2C):   67.0 ml 38.22 ml/m RA Volume:   56.70 ml  32.34 ml/m LA Vol (A4C):   74.5 ml 42.50 ml/m LA Biplane Vol: 73.0 ml 41.64 ml/m  AORTIC VALVE LVOT Vmax:   88.30 cm/s LVOT Vmean:  57.200 cm/s LVOT VTI:    0.199 m  AORTA Ao Root diam: 3.50 cm Ao Asc diam:  3.60 cm MITRAL VALVE MV Area (PHT): 3.91 cm     SHUNTS MV Decel Time: 194 msec     Systemic VTI:  0.20 m MV E velocity: 104.00 cm/s  Systemic Diam: 2.40 cm MV A velocity: 73.70 cm/s MV E/A ratio:  1.41 Loralie Champagne MD Electronically signed by Loralie Champagne MD Signature Date/Time: 11/08/2020/4:55:13 PM    Final      Rise Patience, DO 11/09/2020, 8:54 AM PGY-1, [place]  Intern pager: [phone], text pages welcome

## 2020-11-10 ENCOUNTER — Telehealth: Payer: Self-pay

## 2020-11-10 ENCOUNTER — Other Ambulatory Visit: Payer: Self-pay | Admitting: Nurse Practitioner

## 2020-11-10 DIAGNOSIS — R0600 Dyspnea, unspecified: Secondary | ICD-10-CM | POA: Diagnosis not present

## 2020-11-10 DIAGNOSIS — I959 Hypotension, unspecified: Secondary | ICD-10-CM | POA: Diagnosis not present

## 2020-11-10 DIAGNOSIS — N186 End stage renal disease: Secondary | ICD-10-CM | POA: Diagnosis not present

## 2020-11-10 DIAGNOSIS — I5021 Acute systolic (congestive) heart failure: Secondary | ICD-10-CM | POA: Diagnosis not present

## 2020-11-10 DIAGNOSIS — Z992 Dependence on renal dialysis: Secondary | ICD-10-CM | POA: Diagnosis not present

## 2020-11-10 MED FILL — LOSARTAN POTASSIUM 50 MG TA: 50 | 30 days supply | Qty: 30 | Fill #0

## 2020-11-10 MED FILL — CALCIUM ACETATE (PHOS BINDE: 667 | 30 days supply | Qty: 150 | Fill #1

## 2020-11-10 MED FILL — rOPINIRole HCL 0.5 MG TABS: 0.5 | 30 days supply | Qty: 15 | Fill #0

## 2020-11-10 NOTE — Telephone Encounter (Signed)
Please call patient about the metoclopramide.  Ideally this medication is for short-term use.  Please remind her to look for any side effects of the medication like involuntary muscle movements / jerking / lipsmacking.  It is okay to refill this medication, plus an additional refill.  She should follow-up in the office with [place] in a couple of months. Thanks.

## 2020-11-10 NOTE — Discharge Summary (Addendum)
[facility] Discharge Summary  Patient name: Tina Gomez Medical record number: [id number] Date of birth: 05-13-1956 Age: 65 y.o. Gender: female Date of Admission: 11/07/2020  Date of Discharge: 11/09/2020 Admitting Physician: Blane Ohara McDiarmid, MD  Primary Care Provider: Charlott Rakes, MD Consultants: Nephrology, Cardiology  Indication for Hospitalization: Dyspnea  Discharge Diagnoses/Problem List:  Patient Active Problem List   Diagnosis Date Noted  . Shortness of breath   . Acute exacerbation of CHF (congestive heart failure) ([place]) 11/07/2020  . Protein-calorie malnutrition, severe 07/28/2020  . ARF (acute renal failure) ([place]) 07/23/2020  . Diabetic gastroparesis ([place])   . Abnormal finding on GI tract imaging   . H. pylori infection   . Diverticulitis 05/24/2020  . History of stroke 05/24/2020  . Anemia 05/24/2020  . Nausea and vomiting 05/24/2020  . AKI (acute kidney injury) ([place]) 05/24/2020  . Dehydration 05/24/2020  . Hypomagnesemia 05/24/2020  . Hypokalemia 05/24/2020  . Tobacco abuse 05/24/2020  . Emphysema of lung ([place]) 05/24/2020  . Psoriasis 02/14/2016  . Type 2 diabetes mellitus with diabetic neuropathy, unspecified ([place]) 01/18/2014  . Essential hypertension, benign 01/18/2014  . GERD (gastroesophageal reflux disease) 01/18/2014  . Hyperlipidemia 01/18/2014  . Cerebral infarction ([place]) 10/22/2013  . Malignant hypertension 10/22/2013     Disposition: Home  Discharge Condition: Stable, improved  Discharge Exam:  General: NAD, sitting up in bed, well-appearing Cardiovascular: RRR, no m/r/g appreciated at time of exam Respiratory: CTAB, no increased WOB, comfortable on room air Abdomen: soft, non-tender, non-distended Extremities: moving all appropriately, up walking in the room.General: NAD, sitting up in bed, well-appearing  Brief Hospital Course:  Tina Gomez is a 65 y.o. female who presented with worsening shortness of  breath in the setting of ESRD and incomplete dialysis sessions. PMH is significant for COPD, T2DM, ESRD on HD MWF, HTN, HLD, anemia (previously requiring transfusion), history of CVA with residual right-sided weakness. Below is her hospital course.   Dyspnea, likely multifactorial  ESRD  New HFrEF In ED, CXR consistent with interstitial edema and right pleural effusion. Vitals stable- not tachycardic or hypoxic. CBC without leukocytosis, Hgb 7.7. Creatinine 3.97, K 3.4. EKG with T-wave inversions,Troponin 78>79, BNP 1,369.1 . Cardiology consulted in ED and recommended admission. (increased from 209.4 four months ago). No peripheral edema in extremities, but JVD present on physical exam. Nephrology was consulted and noted that patients outpatient dialysis treatments had been shortened by her, due to RLS and difficulty sitting still during HD. Given this, requip was added to help her RLS and patient was given dialysis the following day and was able to complete the session.   HFrEF  cardiomyopathy, etiology unclear Echocardiogram performed in the hospital demonstrated EF 30% with moderate to severely decreased function and global hypokinesis of the LV, grade II diastolic dysfunction.  Cardiology was consulted with recommendations for outpatient follow-up with cardiac catheterization.  Volume management will be per hemodialysis as patient does not make much urine and is not really candidate for diuretics.  It will be very important to optimize her hemodialysis sessions.  Restless leg syndrome Patient noted to have restless leg syndrome, which has been interfering with ability to tolerate full hemodialysis sessions.  During hospitalization, patient was started on Requip 0.5 mg daily in addition to her home medication of 2 mg at night.  Patient was able to tolerate full hemodialysis session and discharged with additional 0.5 mg of Requip.  Anemia, likely of chronic disease While hospitalized, patient was  found to  have anemia with Hgb ranging from 7.1 - 7.5.  Patient reports a history of anemia, due to age range and lack of prior colonoscopy would recommend outpatient colonoscopy.   Issues for follow-up: 1. Patient discharged with extra 0.55m Requip to assist with HD session toleration.  2. Patient needs colonoscopy and further evaluation for possible causes of anemia. Hgb on discharge was 7.3. Transfusion threshold of 7 as patient does not have documented cardiac disease.  3. Recommend outpatient PFT's due to concern for COPD without formal diagnosis. 4. Hb 7.3 in hospital. Recommend CBC 3-5 days post discharge. 5. Outpatient follow-up with cardiology for arrangement of outpatient cath as well as labs (TSH, and renal function panel) 6. Patient started on Losartan 574mdaily as a recommendation for cardiology, recommend BP monitoring with titration of medications   Significant Procedures: HD  Significant Labs and Imaging:  Recent Labs  Lab 11/07/20 1215 11/08/20 0442 11/09/20 0351  WBC 8.7 8.8 8.7  HGB 7.7* 7.1* 7.3*  HCT 26.2* 22.2* 23.3*  PLT 322 273 296   Recent Labs  Lab 11/07/20 1215 11/08/20 0442  NA 141 137  K 3.4* 3.6  CL 98 99  CO2 30 24  GLUCOSE 158* 156*  BUN 23 33*  CREATININE 3.97* 4.64*  CALCIUM 8.5* 8.4*  ALKPHOS 65  --   AST 15  --   ALT <5  --   ALBUMIN 2.8*  --     DG Chest Port 1 View  Result Date: 11/07/2020 CLINICAL DATA:  Shortness of breath EXAM: PORTABLE CHEST 1 VIEW COMPARISON:  07/27/2020 FINDINGS: Right dialysis catheter remains in place, unchanged. Cardiomegaly. Diffuse interstitial prominence throughout the lungs, likely interstitial edema. Bibasilar opacities, favor atelectasis. Suspect small right pleural effusion. IMPRESSION: Findings most compatible with mild interstitial edema. Bibasilar atelectasis and small right effusion. Electronically Signed   By: KeRolm Baptise.D.   On: 11/07/2020 11:21   ECHOCARDIOGRAM COMPLETE  Result Date:  11/08/2020    ECHOCARDIOGRAM REPORT   Patient Name:   Tina BENNINGate of Exam: 11/08/2020 Medical Rec #:  [id number]    Height:       62.0 in Accession #:    [id number]   Weight:       163.1 lb Date of Birth:  03/1956/06/13    BSA:          1.753 m Patient Age:    [age]       BP:           149/64 mmHg Patient Gender: F              HR:           63 bpm. Exam Location:  Inpatient Procedure: 2D Echo, Cardiac Doppler and Color Doppler Indications:    Abnormal ECG R94.31  History:        Patient has prior history of Echocardiogram examinations, most                 recent 10/23/2013. Stroke and COPD; Risk Factors:Hypertension,                 Diabetes and Current Smoker. CKD.  Sonographer:    JuVickie EpleyDCS Referring Phys: [id number]. Left ventricular ejection fraction, by estimation, is 30%. The left ventricle has moderate to severely decreased function. The left ventricle demonstrates global hypokinesis. The left ventricular internal cavity size was mildly dilated. There is  mild  left ventricular hypertrophy. Left ventricular diastolic parameters are consistent with Grade II diastolic dysfunction (pseudonormalization).  2. Right ventricular systolic function is mildly reduced. The right ventricular size is normal. Tricuspid regurgitation signal is inadequate for assessing PA pressure.  3. Left atrial size was moderately dilated.  4. Right atrial size was mildly dilated.  5. The mitral valve is normal in structure. Mild mitral valve regurgitation. No evidence of mitral stenosis.  6. The aortic valve is tricuspid. Aortic valve regurgitation is not visualized. No aortic stenosis is present.  7. The inferior vena cava is dilated in size with >50% respiratory variability, suggesting right atrial pressure of 8 mmHg. FINDINGS  Left Ventricle: Left ventricular ejection fraction, by estimation, is 30%. The left ventricle has moderate to severely decreased function. The left ventricle  demonstrates global hypokinesis. The left ventricular internal cavity size was mildly dilated. There is mild left ventricular hypertrophy. Left ventricular diastolic parameters are consistent with Grade II diastolic dysfunction (pseudonormalization). Right Ventricle: The right ventricular size is normal. No increase in right ventricular wall thickness. Right ventricular systolic function is mildly reduced. Tricuspid regurgitation signal is inadequate for assessing PA pressure. Left Atrium: Left atrial size was moderately dilated. Right Atrium: Right atrial size was mildly dilated. Pericardium: Trivial pericardial effusion is present. Mitral Valve: The mitral valve is normal in structure. Mild mitral valve regurgitation. No evidence of mitral valve stenosis. Tricuspid Valve: The tricuspid valve is normal in structure. Tricuspid valve regurgitation is not demonstrated. Aortic Valve: The aortic valve is tricuspid. Aortic valve regurgitation is not visualized. No aortic stenosis is present. Pulmonic Valve: The pulmonic valve was normal in structure. Pulmonic valve regurgitation is trivial. Aorta: The aortic root is normal in size and structure. Venous: The inferior vena cava is dilated in size with greater than 50% respiratory variability, suggesting right atrial pressure of 8 mmHg. IAS/Shunts: No atrial level shunt detected by color flow Doppler.  LEFT VENTRICLE PLAX 2D LVIDd:         6.10 cm      Diastology LVIDs:         4.90 cm      LV e' medial:    4.05 cm/s LV PW:         1.10 cm      LV E/e' medial:  25.7 LV IVS:        1.10 cm      LV e' lateral:   5.55 cm/s LVOT diam:     2.40 cm      LV E/e' lateral: 18.7 LV SV:         90 LV SV Index:   51 LVOT Area:     4.52 cm  LV Volumes (MOD) LV vol d, MOD A2C: 213.0 ml LV vol d, MOD A4C: 217.0 ml LV vol s, MOD A2C: 130.0 ml LV vol s, MOD A4C: 135.0 ml LV SV MOD A2C:     83.0 ml LV SV MOD A4C:     217.0 ml LV SV MOD BP:      89.1 ml RIGHT VENTRICLE RV S prime:     9.55  cm/s TAPSE (M-mode): 2.5 cm LEFT ATRIUM             Index       RIGHT ATRIUM           Index LA diam:        4.80 cm 2.74 cm/m  RA Area:     19.00 cm LA Vol (  A2C):   67.0 ml 38.22 ml/m RA Volume:   56.70 ml  32.34 ml/m LA Vol (A4C):   74.5 ml 42.50 ml/m LA Biplane Vol: 73.0 ml 41.64 ml/m  AORTIC VALVE LVOT Vmax:   88.30 cm/s LVOT Vmean:  57.200 cm/s LVOT VTI:    0.199 m  AORTA Ao Root diam: 3.50 cm Ao Asc diam:  3.60 cm MITRAL VALVE MV Area (PHT): 3.91 cm     SHUNTS MV Decel Time: 194 msec     Systemic VTI:  0.20 m MV E velocity: 104.00 cm/s  Systemic Diam: 2.40 cm MV A velocity: 73.70 cm/s MV E/A ratio:  1.41 Loralie Champagne MD Electronically signed by Loralie Champagne MD Signature Date/Time: 11/08/2020/4:55:13 PM    Final     Results/Tests Pending at Time of Discharge: None  Discharge Medications:  Allergies as of 11/09/2020      Reactions   Codeine Nausea And Vomiting   Adhesive [tape] Other (See Comments)   Tape breaks out the skin if it is left on for a lengthy period of time      Medication List    STOP taking these medications   amLODipine 10 MG tablet Commonly known as: NORVASC   aspirin 325 MG tablet Replaced by: aspirin 81 MG chewable tablet   hydrALAZINE 25 MG tablet Commonly known as: APRESOLINE     TAKE these medications   aspirin 81 MG chewable tablet Chew 1 tablet (81 mg total) by mouth daily. Replaces: aspirin 325 MG tablet   atorvastatin 40 MG tablet Commonly known as: LIPITOR TAKE 1 TABLET BY MOUTH DAILY AT 6 PM. What changed:   how much to take  how to take this  when to take this  additional instructions   blood glucose meter kit and supplies Use as instructed   True Metrix Meter Devi 1 each by Does not apply route 3 (three) times daily before meals.   calcium acetate 667 MG capsule Commonly known as: PHOSLO Take 2,001 mg by mouth 3 (three) times daily with meals.   carvedilol 12.5 MG tablet Commonly known as: COREG Take 1 tablet (12.5 mg  total) by mouth 2 (two) times daily with a meal.   DIALYVITE 800 WITH ZINC 0.8 MG Tabs Take 1 tablet by mouth at bedtime.   freestyle lancets Use as instructed   TRUEplus Lancets 28G Misc 1 each by Does not apply route 3 (three) times daily before meals.   losartan 50 MG tablet Commonly known as: COZAAR Take 1 tablet (50 mg total) by mouth daily.   metoCLOPramide 5 MG tablet Commonly known as: REGLAN TAKE 1 TABLET (5 MG TOTAL) BY MOUTH 4 (FOUR) TIMES DAILY - BEFORE MEALS AND AT BEDTIME. What changed: See the new instructions.   rOPINIRole 2 MG tablet Commonly known as: REQUIP Take 2 mg by mouth at bedtime. What changed: Another medication with the same name was added. Make sure you understand how and when to take each.   rOPINIRole 0.5 MG tablet Commonly known as: REQUIP Take 1 tablet (0.5 mg total) by mouth daily. What changed: You were already taking a medication with the same name, and this prescription was added. Make sure you understand how and when to take each.   TRUEtest Test test strip Generic drug: glucose blood Use as instructed       Discharge Instructions: Please refer to Patient Instructions section of EMR for full details.  Patient was counseled important signs and symptoms that should prompt return  to medical care, changes in medications, dietary instructions, activity restrictions, and follow up appointments.   Follow-Up Appointments:  Follow-up Information    [facility]. Schedule an appointment as soon as possible for a visit on 12/12/2020.   Why: 9:15 for hospital follow up Contact information: [place] [phone]       Almyra Deforest, [place] Follow up on 11/24/2020.   Specialties: Cardiology, Radiology Why: at 11:15am for your follow up appt Contact information: [place] [address] [phone]               Rise Patience,  [place] 11/10/2020, 5:52 AM PGY-1, [place]

## 2020-11-10 NOTE — Telephone Encounter (Signed)
Transition Care Management Follow-up Telephone Call  Date of discharge and from where: 11/09/2020 from [facility]  How have you been since you were released from the hospital? Pt states that she is doing very well and had no questions or concerns during the call today.   Any questions or concerns? No  Items Reviewed:  Did the pt receive and understand the discharge instructions provided? Yes   Medications obtained and verified? Yes   Other? No   Any new allergies since your discharge? No   Dietary orders reviewed? Heart Healthy and DM Diet  Do you have support at home? Yes   Functional Questionnaire: (I = Independent and D = Dependent) ADLs: I  Bathing/Dressing- I  Meal Prep- I  Eating- I  Maintaining continence- I  Transferring/Ambulation- I  Managing Meds- I   Follow up appointments reviewed:   PCP Hospital f/u appt confirmed? Yes  Scheduled to see Charlott Rakes, MDon 12/12/2020 @ 09:30am.  [facility] f/u appt confirmed? Yes  Scheduled to see Almyra Deforest, PA on 11/24/2020 @ 11:15am.  Are transportation arrangements needed? No    If their condition worsens, is the pt aware to call PCP or go to the Emergency Dept.? Yes  Was the patient provided with contact information for the PCP's office or ED? Yes  Was to pt encouraged to call back with questions or concerns? Yes

## 2020-11-10 NOTE — Telephone Encounter (Signed)
Please advise if ok to refill this.

## 2020-11-13 ENCOUNTER — Other Ambulatory Visit: Payer: Self-pay | Admitting: Nurse Practitioner

## 2020-11-13 ENCOUNTER — Telehealth: Payer: Self-pay

## 2020-11-13 MED FILL — METOCLOPRAMIDE 5 MG TABLET: 5 | 30 days supply | Qty: 120 | Fill #0

## 2020-11-13 NOTE — Telephone Encounter (Signed)
2 month refill for Reglan sent to patient's pharmacy. Also called patient and reminded her to watch for the possible side effects of involuntary muscle movements.

## 2020-11-15 ENCOUNTER — Other Ambulatory Visit (HOSPITAL_COMMUNITY): Payer: Self-pay | Admitting: Nephrology

## 2020-11-15 MED FILL — ROPINIROLE HCL 0.5 MG TABS: 0.5 | 22 days supply | Qty: 45 | Fill #0

## 2020-11-22 ENCOUNTER — Other Ambulatory Visit: Payer: Self-pay | Admitting: Nephrology

## 2020-11-24 ENCOUNTER — Other Ambulatory Visit: Payer: Self-pay

## 2020-11-24 ENCOUNTER — Encounter: Payer: Self-pay | Admitting: Physician Assistant

## 2020-11-24 ENCOUNTER — Other Ambulatory Visit (HOSPITAL_COMMUNITY)
Admission: RE | Admit: 2020-11-24 | Discharge: 2020-11-24 | Disposition: A | Discharge status: Home / Self Care | Payer: MEDICAID | Source: Ambulatory Visit | Attending: Cardiovascular Disease | Admitting: Cardiovascular Disease

## 2020-11-24 ENCOUNTER — Ambulatory Visit (INDEPENDENT_AMBULATORY_CARE_PROVIDER_SITE_OTHER): Payer: Medicaid Other | Admitting: Physician Assistant

## 2020-11-24 VITALS — BP 170/62 | HR 63 | Ht 62.0 in | Wt 172.4 lb

## 2020-11-24 DIAGNOSIS — E119 Type 2 diabetes mellitus without complications: Secondary | ICD-10-CM

## 2020-11-24 DIAGNOSIS — E785 Hyperlipidemia, unspecified: Secondary | ICD-10-CM | POA: Diagnosis not present

## 2020-11-24 DIAGNOSIS — N186 End stage renal disease: Secondary | ICD-10-CM | POA: Diagnosis not present

## 2020-11-24 DIAGNOSIS — J449 Chronic obstructive pulmonary disease, unspecified: Secondary | ICD-10-CM

## 2020-11-24 DIAGNOSIS — Z992 Dependence on renal dialysis: Secondary | ICD-10-CM | POA: Diagnosis not present

## 2020-11-24 DIAGNOSIS — I10 Essential (primary) hypertension: Secondary | ICD-10-CM | POA: Diagnosis not present

## 2020-11-24 DIAGNOSIS — Z01818 Encounter for other preprocedural examination: Secondary | ICD-10-CM | POA: Diagnosis not present

## 2020-11-24 DIAGNOSIS — Z8673 Personal history of transient ischemic attack (TIA), and cerebral infarction without residual deficits: Secondary | ICD-10-CM | POA: Diagnosis not present

## 2020-11-24 DIAGNOSIS — I5021 Acute systolic (congestive) heart failure: Secondary | ICD-10-CM | POA: Diagnosis not present

## 2020-11-24 DIAGNOSIS — I5023 Acute on chronic systolic (congestive) heart failure: Secondary | ICD-10-CM

## 2020-11-24 NOTE — H&P (View-Only) (Signed)
Cardiology Office Note:    Date:  11/26/2020   ID:  Tina Gomez, DOB 08-31-1956, MRN [id number]  PCP:  Tina Gomez, [place]  Cardiologist:  Tina Ruths, MD  Advanced Practice Provider:  No care team member to display Electrophysiologist:  None   Referring MD: Tina Rakes, MD   Chief Complaint  Patient presents with  . Follow-up    Seen for Dr. Stanford Breed, setting up cath    History of Present Illness:    Tina Gomez is a 65 y.o. female with a hx of hypertension, hyperlipidemia, DM 2, RLS, anemia, ESRD on HD, COPD and CVA.  She has been on hemodialysis since November 2021.  She has issues with her left upper arm AV fistula and required revision on 2/24.  Dialysis has been done through her HD catheter in the right upper chest.  She also has periodic hypotension is as result of hemodialysis.  Patient presented recently to the hospital in early March with progressive worsening shortness of breath.  Creatinine on arrival was 3.9.  BMP 1369.  High-sensitivity troponin 78--79.  Chest x-ray showed body interstitial edema with basilar atelectasis and a small right effusion.  EKG showed T wave inversion in the lateral leads.  Patient was treated with nebulizer and steroids with improvement in dyspnea.  Echocardiogram obtained during admission shows EF 30% with diffuse hypokinesis.  Her condition improved significantly after hemodialysis.  She denied any recent anginal symptom, it was initially recommended for her to proceed with cardiac catheterization, however she was eager to get out of the hospital to take care of her son who has autism.  Therefore it was recommended to proceed with outpatient cardiac catheterization.  Patient presents today for follow-up along with her daughter.  She did not take any of her blood pressure medication today therefore her blood pressure is quite elevated.  She usually hold her blood pressure medication until after  dialysis on dialysis days in case her blood pressure drops too much.  She denies any recent chest pain.  She still has intermittent dyspnea however her lung is very much clear on exam.  We discussed the recent echocardiogram finding, she is agreeable to proceed with cardiac catheterization.  She is aware of the risk and benefit of the procedure.  I will see her back in 1 month.   Past Medical History:  Diagnosis Date  . Anemia   . Chronic kidney disease   . COPD (chronic obstructive pulmonary disease) ([place]) 05/2019   no inhaler  . Diabetes mellitus without complication ([place])    no meds - diet controlled  . History of blood transfusion 09/2020   1 unit  . HLD (hyperlipidemia)   . Hypertension   . PONV (postoperative nausea and vomiting)   . Restless legs syndrome (RLS)   . Stroke [facility])     Past Surgical History:  Procedure Laterality Date  . AV FISTULA PLACEMENT Left 07/27/2020   Procedure: LEFT BRACHIO-BASILIC ARTERIOVENOUS (AV) FISTULA CREATION;  Surgeon: Tina Robins, MD;  Location: [place];  Service: Vascular;  Laterality: Left;  . BASCILIC VEIN TRANSPOSITION Left 11/02/2020   Procedure: LEFT UPPER ARM SECOND STAGE BASILIC VEIN TRANSPOSITION;  Surgeon: Tina Robins, MD;  Location: [place];  Service: Vascular;  Laterality: Left;  PERIPHERAL NERVE BLOCK  . CESAREAN SECTION     x 1  . ESOPHAGOGASTRODUODENOSCOPY (EGD) WITH PROPOFOL N/A 05/26/2020   Procedure: ESOPHAGOGASTRODUODENOSCOPY (EGD) WITH PROPOFOL;  Surgeon:  Tina Shipper, MD;  Location: [facility] ENDOSCOPY;  Service: Endoscopy;  Laterality: N/A;  . INSERTION OF DIALYSIS CATHETER Right 07/27/2020   Procedure: INSERTION OF RIGHT INTERNAL JUGULAR TUNNELED DIALYSIS CATHETER;  Surgeon: Tina Robins, MD;  Location: [place];  Service: Vascular;  Laterality: Right;  . IR FLUORO GUIDE CV LINE RIGHT  07/25/2020  . IR US GUIDE VASC ACCESS RIGHT  07/25/2020  . TONSILLECTOMY    . UPPER GI ENDOSCOPY     growth removed from voice box  .  WRIST SURGERY Left    ganglion cyst removal    Current Medications: Current Meds  Medication Sig  . aspirin 81 MG chewable tablet Chew 1 tablet (81 mg total) by mouth daily.  Marland Kitchen atorvastatin (LIPITOR) 40 MG tablet TAKE 1 TABLET BY MOUTH DAILY AT 6 PM. (Patient taking differently: Take 40 mg by mouth every evening.)  . B Complex-C-Zn-Folic Acid (DIALYVITE 485 WITH ZINC) 0.8 MG TABS Take 1 tablet by mouth at bedtime.  . Blood Glucose Monitoring Suppl (BLOOD GLUCOSE METER) kit Use as instructed  . Blood Glucose Monitoring Suppl (TRUE METRIX METER) DEVI 1 each by Does not apply route 3 (three) times daily before meals.  . calcium acetate (PHOSLO) 667 MG capsule Take 2,001 mg by mouth 3 (three) times daily with meals.  . carvedilol (COREG) 12.5 MG tablet Take 1 tablet (12.5 mg total) by mouth 2 (two) times daily with a meal.  . glucose blood (TRUETEST TEST) test strip Use as instructed  . Lancets (FREESTYLE) lancets Use as instructed  . losartan (COZAAR) 50 MG tablet Take 1 tablet (50 mg total) by mouth daily.  . metoCLOPramide (REGLAN) 5 MG tablet TAKE 1 TABLET BY MOUTH FOUR TIMES DAILY, BEFORE MEALS AND AT BEDTIME.  Marland Kitchen rOPINIRole (REQUIP) 2 MG tablet Take 2 mg by mouth in the morning and at bedtime.  . TRUEplus Lancets 28G MISC 1 each by Does not apply route 3 (three) times daily before meals.     Allergies:   Codeine and Adhesive [tape]   Social History   Socioeconomic History  . Marital status: Divorced    Spouse name: Not on file  . Number of children: Not on file  . Years of education: Not on file  . Highest education level: Not on file  Occupational History  . Not on file  Tobacco Use  . Smoking status: Current Every Day Smoker    Packs/day: 0.25    Years: 10.00    Pack years: 2.50    Types: Cigarettes  . Smokeless tobacco: Never Used  . Tobacco comment: 5 cigs daily  Vaping Use  . Vaping Use: Never used  Substance and Sexual Activity  . Alcohol use: No  . Drug use: No   . Sexual activity: Not on file  Other Topics Concern  . Not on file  Social History Narrative  . Not on file   Social Determinants of Health   Financial Resource Strain: Not on file  Food Insecurity: Not on file  Transportation Needs: Not on file  Physical Activity: Not on file  Stress: Not on file  Social Connections: Not on file     Family History: The patient's family history includes Hyperlipidemia in her father and mother; Hypertension in her father and mother; Stroke in her mother.  ROS:   Please see the history of present illness.     All other systems reviewed and are negative.  EKGs/Labs/Other Studies Reviewed:    The following  studies were reviewed today:  Echo 11/08/2020 1. Left ventricular ejection fraction, by estimation, is 30%. The left  ventricle has moderate to severely decreased function. The left ventricle  demonstrates global hypokinesis. The left ventricular internal cavity size  was mildly dilated. There is mild  left ventricular hypertrophy. Left ventricular diastolic parameters are  consistent with Grade II diastolic dysfunction (pseudonormalization).  2. Right ventricular systolic function is mildly reduced. The right  ventricular size is normal. Tricuspid regurgitation signal is inadequate  for assessing PA pressure.  3. Left atrial size was moderately dilated.  4. Right atrial size was mildly dilated.  5. The mitral valve is normal in structure. Mild mitral valve  regurgitation. No evidence of mitral stenosis.  6. The aortic valve is tricuspid. Aortic valve regurgitation is not  visualized. No aortic stenosis is present.  7. The inferior vena cava is dilated in size with >50% respiratory  variability, suggesting right atrial pressure of 8 mmHg.   EKG:  EKG is ordered today.  The ekg ordered today demonstrates normal sinus rhythm, first-degree AV block, no significant ST-T wave changes.  Recent Labs: 05/25/2020: TSH 3.049 07/28/2020:  Magnesium 2.0 11/07/2020: ALT <5; B Natriuretic Peptide 1,369.1 11/24/2020: BUN 47; Creatinine, Ser 6.10; Hemoglobin 8.4; Platelets 376; Potassium 4.5; Sodium 141  Recent Lipid Panel    Component Value Date/Time   CHOL 251 (H) 11/25/2018 1506   TRIG 272 (H) 11/25/2018 1506   HDL 44 11/25/2018 1506   CHOLHDL 5.7 (H) 11/25/2018 1506   CHOLHDL 4.8 11/13/2016 1018   VLDL 40 (H) 02/15/2016 0907   LDLCALC 153 (H) 11/25/2018 1506     Risk Assessment/Calculations:       Physical Exam:    VS:  BP (!) 170/62   Pulse 63   Ht 5' 2"  (1.575 m)   Wt 172 lb 6.4 oz (78.2 kg)   LMP  (LMP Unknown)   SpO2 90%   BMI 31.53 kg/m     Wt Readings from Last 3 Encounters:  11/24/20 172 lb 6.4 oz (78.2 kg)  11/09/20 167 lb 3.2 oz (75.8 kg)  11/02/20 170 lb (77.1 kg)     GEN:  Well nourished, well developed in no acute distress HEENT: Normal NECK: No JVD; No carotid bruits LYMPHATICS: No lymphadenopathy CARDIAC: RRR, no murmurs, rubs, gallops RESPIRATORY:  Clear to auscultation without rales, wheezing or rhonchi  ABDOMEN: Soft, non-tender, non-distended MUSCULOSKELETAL:  No edema; No deformity  SKIN: Warm and dry NEUROLOGIC:  Alert and oriented x 3 PSYCHIATRIC:  Normal affect   ASSESSMENT:    1. Acute systolic heart failure ([place])   2. Pre-procedural examination   3. Primary hypertension   4. Hyperlipidemia LDL goal <70   5. Controlled type 2 diabetes mellitus without complication, without long-term current use of insulin ([place])   6. Chronic obstructive pulmonary disease, unspecified COPD type ([place])   7. ESRD on dialysis ([place])   8. H/O: CVA (cerebrovascular accident)    PLAN:    In order of problems listed above:  1. Acute systolic heart failure: Recent echocardiogram revealed ejection fraction down to 30%.  Dr. Stanford Breed who recommended proceeding with outpatient cardiac catheterization.  Risk and benefit of the procedure has been explained to the patient, she is agreeable to  proceed  - Risk and benefit of procedure explained to the patient who display clear understanding and agree to proceed. Discussed with patient possible procedural risk include bleeding, vascular injury, renal injury (renal injury does not apply to her  since she has ESRD), arrythmia, MI, stroke and loss of limb or life.  2. Hypertension: Blood pressure is elevated today, however she did not take any of her blood pressure medication due to the need for dialysis.  If she usually take her blood pressure medication after the dialysis  3. Hyperlipidemia: On Lipitor.  4. DM2: Managed by primary care provider  5. COPD: Her lungs is clear today, no sign of acute exacerbation  6. End-stage renal disease on dialysis: Dialysis Monday Wednesday Friday 7. History of CVA: No recurrence   Shared Decision Making/Informed Consent The risks [stroke (1 in 1000), death (1 in 1000), kidney failure [usually temporary] (1 in 500), bleeding (1 in 200), allergic reaction [possibly serious] (1 in 200)], benefits (diagnostic support and management of coronary artery disease) and alternatives of a cardiac catheterization were discussed in detail with Ms. Sills and she is willing to proceed.        Medication Adjustments/Labs and Tests Ordered: Current medicines are reviewed at length with the patient today.  Concerns regarding medicines are outlined above.  Orders Placed This Encounter  Procedures  . Basic metabolic panel  . CBC  . EKG 12-Lead   No orders of the defined types were placed in this encounter.   Patient Instructions  Medication Instructions:  Your physician recommends that you continue on your current medications as directed. Please refer to the Current Medication list given to you today.  *If you need a refill on your cardiac medications before your next appointment, please call your pharmacy*  Lab Work: Your physician recommends that you return for lab work in:  BMET  CBC You will  need a COVID-19  test prior to your procedure. You are scheduled for 11/24/20 at 1:35 PM. This is a Drive Up Visit at [address] [place], [place] [address]. Someone will direct you to the appropriate testing line. Stay in your car and someone will be with you shortly.  If you have labs (blood work) drawn today and your tests are completely normal, you will receive your results only by: Marland Kitchen MyChart Message (if you have MyChart) OR . A paper copy in the mail If you have any lab test that is abnormal or we need to change your treatment, we will call you to review the results.  Testing/Procedures: Your physician has requested that you have a cardiac catheterization. Cardiac catheterization is used to diagnose and/or treat various heart conditions. Doctors may recommend this procedure for a number of different reasons. The most common reason is to evaluate chest pain. Chest pain can be a symptom of coronary artery disease (CAD), and cardiac catheterization can show whether plaque is narrowing or blocking your heart's arteries. This procedure is also used to evaluate the valves, as well as measure the blood flow and oxygen levels in different parts of your heart. For further information please visit [email or web address]. Please follow instruction sheet, as given.   Follow-Up: At [facility], you and your health needs are our priority.  As part of our continuing mission to provide you with exceptional heart care, we have created designated Provider Care Teams.  These Care Teams include your primary Cardiologist (physician) and Advanced Practice Providers (APPs -  Physician Assistants and Nurse Practitioners) who all work together to provide you with the care you need, when you need it.  Your next appointment:   1 month(s)  The format for your next appointment:   In Person  Provider:   Kirk Ruths, MD  or APP   Other Instructions       [place] [address] Dept: [phone] Loc: [phone]  TERASA ORSINI  11/24/2020  You are scheduled for a Cardiac Catheterization on Tuesday, March 22 with Dr. Shelva Majestic.  1. Please arrive at the [facility] (Main Entrance A) at [facility]: [address] [place], [place] [address] at 7:00 AM (This time is two hours before your procedure to ensure your preparation). Free valet parking service is available.   Special note: Every effort is made to have your procedure done on time. Please understand that emergencies sometimes delay scheduled procedures.  2. Diet: Do not eat solid foods after midnight.  The patient may have clear liquids until 5am upon the day of the procedure.  3. Labs: You will need to have blood drawn on Friday, March 18 at [facility]  Open: [place] (Lunch 12:30 - 1:30)   Phone: [phone]. You do not need to be fasting.  4. Medication instructions in preparation for your procedure:   Contrast Allergy: No   On the morning of your procedure, take your Aspirin and any morning medicines NOT listed above.  You may use sips of water.  5. Plan for one night stay--bring personal belongings. 6. Bring a current list of your medications and current insurance cards. 7. You MUST have a responsible person to drive you home. 8. Someone MUST be with you the first 24 hours after you arrive home or your discharge will be delayed. 9. Please wear clothes that are easy to get on and off and wear slip-on shoes.  Thank you for allowing [place] to care for you!   -- [facility] services      Signed, Almyra Deforest, [place]  11/26/2020 11:27 PM    [place]

## 2020-11-24 NOTE — Progress Notes (Signed)
Cardiology Office Note:    Date:  11/26/2020   ID:  Tina Gomez, DOB December 31, 1955, MRN [id number]  PCP:  Charlott Rakes, [place]  Cardiologist:  Kirk Ruths, MD  Advanced Practice Provider:  No care team member to display Electrophysiologist:  None   Referring MD: Charlott Rakes, MD   Chief Complaint  Patient presents with  . Follow-up    Seen for Dr. Stanford Breed, setting up cath    History of Present Illness:    Tina Gomez is a 65 y.o. female with a hx of hypertension, hyperlipidemia, DM 2, RLS, anemia, ESRD on HD, COPD and CVA.  She has been on hemodialysis since November 2021.  She has issues with her left upper arm AV fistula and required revision on 2/24.  Dialysis has been done through her HD catheter in the right upper chest.  She also has periodic hypotension is as result of hemodialysis.  Patient presented recently to the hospital in early March with progressive worsening shortness of breath.  Creatinine on arrival was 3.9.  BMP 1369.  High-sensitivity troponin 78--79.  Chest x-ray showed body interstitial edema with basilar atelectasis and a small right effusion.  EKG showed T wave inversion in the lateral leads.  Patient was treated with nebulizer and steroids with improvement in dyspnea.  Echocardiogram obtained during admission shows EF 30% with diffuse hypokinesis.  Her condition improved significantly after hemodialysis.  She denied any recent anginal symptom, it was initially recommended for her to proceed with cardiac catheterization, however she was eager to get out of the hospital to take care of her son who has autism.  Therefore it was recommended to proceed with outpatient cardiac catheterization.  Patient presents today for follow-up along with her daughter.  She did not take any of her blood pressure medication today therefore her blood pressure is quite elevated.  She usually hold her blood pressure medication until after  dialysis on dialysis days in case her blood pressure drops too much.  She denies any recent chest pain.  She still has intermittent dyspnea however her lung is very much clear on exam.  We discussed the recent echocardiogram finding, she is agreeable to proceed with cardiac catheterization.  She is aware of the risk and benefit of the procedure.  I will see her back in 1 month.   Past Medical History:  Diagnosis Date  . Anemia   . Chronic kidney disease   . COPD (chronic obstructive pulmonary disease) ([place]) 05/2019   no inhaler  . Diabetes mellitus without complication ([place])    no meds - diet controlled  . History of blood transfusion 09/2020   1 unit  . HLD (hyperlipidemia)   . Hypertension   . PONV (postoperative nausea and vomiting)   . Restless legs syndrome (RLS)   . Stroke [facility])     Past Surgical History:  Procedure Laterality Date  . AV FISTULA PLACEMENT Left 07/27/2020   Procedure: LEFT BRACHIO-BASILIC ARTERIOVENOUS (AV) FISTULA CREATION;  Surgeon: Cherre Robins, MD;  Location: [place];  Service: Vascular;  Laterality: Left;  . BASCILIC VEIN TRANSPOSITION Left 11/02/2020   Procedure: LEFT UPPER ARM SECOND STAGE BASILIC VEIN TRANSPOSITION;  Surgeon: Cherre Robins, MD;  Location: [place];  Service: Vascular;  Laterality: Left;  PERIPHERAL NERVE BLOCK  . CESAREAN SECTION     x 1  . ESOPHAGOGASTRODUODENOSCOPY (EGD) WITH PROPOFOL N/A 05/26/2020   Procedure: ESOPHAGOGASTRODUODENOSCOPY (EGD) WITH PROPOFOL;  Surgeon:  Irene Shipper, MD;  Location[facility] END[facility]COPY;  Service: Endoscopy;  Laterality: N/A;  . INSERTION OF DIALYSIS CATHETER Right 07/27/2020   Procedure: INSERTION OF RIGHT INTERNAL JUGULAR TUNNELED DIALYSIS CATHETER;  Surgeon: Cherre Robins, MD;  Location: [place];  Service: Vascular;  Laterality: Right;  . IR FLUORO GUIDE CV LINE RIGHT  07/25/2020  . IR US GUIDE VASC ACCESS RIGHT  07/25/2020  . TONSILLECTOMY    . UPPER GI ENDOSCOPY     growth removed from voice box  .  WRIST SURGERY Left    ganglion cyst removal    Current Medications: Current Meds  Medication Sig  . aspirin 81 MG chewable tablet Chew 1 tablet (81 mg total) by mouth daily.  Marland Kitchen atorvastatin (LIPITOR) 40 MG tablet TAKE 1 TABLET BY MOUTH DAILY AT 6 PM. (Patient taking differently: Take 40 mg by mouth every evening.)  . B Complex-C-Zn-Folic Acid (DIALYVITE 010 WITH ZINC) 0.8 MG TABS Take 1 tablet by mouth at bedtime.  . Blood Glucose Monitoring Suppl (BLOOD GLUCOSE METER) kit Use as instructed  . Blood Glucose Monitoring Suppl (TRUE METRIX METER) DEVI 1 each by Does not apply route 3 (three) times daily before meals.  . calcium acetate (PHOSLO) 667 MG capsule Take 2,001 mg by mouth 3 (three) times daily with meals.  . carvedilol (COREG) 12.5 MG tablet Take 1 tablet (12.5 mg total) by mouth 2 (two) times daily with a meal.  . glucose blood (TRUETEST TEST) test strip Use as instructed  . Lancets (FREESTYLE) lancets Use as instructed  . losartan (COZAAR) 50 MG tablet Take 1 tablet (50 mg total) by mouth daily.  . metoCLOPramide (REGLAN) 5 MG tablet TAKE 1 TABLET BY MOUTH FOUR TIMES DAILY, BEFORE MEALS AND AT BEDTIME.  Marland Kitchen rOPINIRole (REQUIP) 2 MG tablet Take 2 mg by mouth in the morning and at bedtime.  . TRUEplus Lancets 28G MISC 1 each by Does not apply route 3 (three) times daily before meals.     Allergies:   Codeine and Adhesive [tape]   Social History   Socioeconomic History  . Marital status: Divorced    Spouse name: Not on file  . Number of children: Not on file  . Years of education: Not on file  . Highest education level: Not on file  Occupational History  . Not on file  Tobacco Use  . Smoking status: Current Every Day Smoker    Packs/day: 0.25    Years: 10.00    Pack years: 2.50    Types: Cigarettes  . Smokeless tobacco: Never Used  . Tobacco comment: 5 cigs daily  Vaping Use  . Vaping Use: Never used  Substance and Sexual Activity  . Alcohol use: No  . Drug use: No   . Sexual activity: Not on file  Other Topics Concern  . Not on file  Social History Narrative  . Not on file   Social Determinants of Health   Financial Resource Strain: Not on file  Food Insecurity: Not on file  Transportation Needs: Not on file  Physical Activity: Not on file  Stress: Not on file  Social Connections: Not on file     Family History: The patient's family history includes Hyperlipidemia in her father and mother; Hypertension in her father and mother; Stroke in her mother.  ROS:   Please see the history of present illness.     All other systems reviewed and are negative.  EKGs/Labs/Other Studies Reviewed:    The following  studies were reviewed today:  Echo 11/08/2020 1. Left ventricular ejection fraction, by estimation, is 30%. The left  ventricle has moderate to severely decreased function. The left ventricle  demonstrates global hypokinesis. The left ventricular internal cavity size  was mildly dilated. There is mild  left ventricular hypertrophy. Left ventricular diastolic parameters are  consistent with Grade II diastolic dysfunction (pseudonormalization).  2. Right ventricular systolic function is mildly reduced. The right  ventricular size is normal. Tricuspid regurgitation signal is inadequate  for assessing PA pressure.  3. Left atrial size was moderately dilated.  4. Right atrial size was mildly dilated.  5. The mitral valve is normal in structure. Mild mitral valve  regurgitation. No evidence of mitral stenosis.  6. The aortic valve is tricuspid. Aortic valve regurgitation is not  visualized. No aortic stenosis is present.  7. The inferior vena cava is dilated in size with >50% respiratory  variability, suggesting right atrial pressure of 8 mmHg.   EKG:  EKG is ordered today.  The ekg ordered today demonstrates normal sinus rhythm, first-degree AV block, no significant ST-T wave changes.  Recent Labs: 05/25/2020: TSH 3.049 07/28/2020:  Magnesium 2.0 11/07/2020: ALT <5; B Natriuretic Peptide 1,369.1 11/24/2020: BUN 47; Creatinine, Ser 6.10; Hemoglobin 8.4; Platelets 376; Potassium 4.5; Sodium 141  Recent Lipid Panel    Component Value Date/Time   CHOL 251 (H) 11/25/2018 1506   TRIG 272 (H) 11/25/2018 1506   HDL 44 11/25/2018 1506   CHOLHDL 5.7 (H) 11/25/2018 1506   CHOLHDL 4.8 11/13/2016 1018   VLDL 40 (H) 02/15/2016 0907   LDLCALC 153 (H) 11/25/2018 1506     Risk Assessment/Calculations:       Physical Exam:    VS:  BP (!) 170/62   Pulse 63   Ht 5' 2"  (1.575 m)   Wt 172 lb 6.4 oz (78.2 kg)   LMP  (LMP Unknown)   SpO2 90%   BMI 31.53 kg/m     Wt Readings from Last 3 Encounters:  11/24/20 172 lb 6.4 oz (78.2 kg)  11/09/20 167 lb 3.2 oz (75.8 kg)  11/02/20 170 lb (77.1 kg)     GEN:  Well nourished, well developed in no acute distress HEENT: Normal NECK: No JVD; No carotid bruits LYMPHATICS: No lymphadenopathy CARDIAC: RRR, no murmurs, rubs, gallops RESPIRATORY:  Clear to auscultation without rales, wheezing or rhonchi  ABDOMEN: Soft, non-tender, non-distended MUSCULOSKELETAL:  No edema; No deformity  SKIN: Warm and dry NEUROLOGIC:  Alert and oriented x 3 PSYCHIATRIC:  Normal affect   ASSESSMENT:    1. Acute systolic heart failure ([place])   2. Pre-procedural examination   3. Primary hypertension   4. Hyperlipidemia LDL goal <70   5. Controlled type 2 diabetes mellitus without complication, without long-term current use of insulin ([place])   6. Chronic obstructive pulmonary disease, unspecified COPD type ([place])   7. ESRD on dialysis ([place])   8. H/O: CVA (cerebrovascular accident)    PLAN:    In order of problems listed above:  1. Acute systolic heart failure: Recent echocardiogram revealed ejection fraction down to 30%.  Dr. Stanford Breed who recommended proceeding with outpatient cardiac catheterization.  Risk and benefit of the procedure has been explained to the patient, she is agreeable to  proceed  - Risk and benefit of procedure explained to the patient who display clear understanding and agree to proceed. Discussed with patient possible procedural risk include bleeding, vascular injury, renal injury (renal injury does not apply to her  since she has ESRD), arrythmia, MI, stroke and loss of limb or life.  2. Hypertension: Blood pressure is elevated today, however she did not take any of her blood pressure medication due to the need for dialysis.  If she usually take her blood pressure medication after the dialysis  3. Hyperlipidemia: On Lipitor.  4. DM2: Managed by primary care provider  5. COPD: Her lungs is clear today, no sign of acute exacerbation  6. End-stage renal disease on dialysis: Dialysis Monday Wednesday Friday 7. History of CVA: No recurrence   Shared Decision Making/Informed Consent The risks [stroke (1 in 1000), death (1 in 1000), kidney failure [usually temporary] (1 in 500), bleeding (1 in 200), allergic reaction [possibly serious] (1 in 200)], benefits (diagnostic support and management of coronary artery disease) and alternatives of a cardiac catheterization were discussed in detail with Ms. Korman and she is willing to proceed.        Medication Adjustments/Labs and Tests Ordered: Current medicines are reviewed at length with the patient today.  Concerns regarding medicines are outlined above.  Orders Placed This Encounter  Procedures  . Basic metabolic panel  . CBC  . EKG 12-Lead   No orders of the defined types were placed in this encounter.   Patient Instructions  Medication Instructions:  Your physician recommends that you continue on your current medications as directed. Please refer to the Current Medication list given to you today.  *If you need a refill on your cardiac medications before your next appointment, please call your pharmacy*  Lab Work: Your physician recommends that you return for lab work in:  BMET  CBC You will  need a COVID-19  test prior to your procedure. You are scheduled for 11/24/20 at 1:35 PM. This is a Drive Up Visit at [address] [place], [place] [address]. Someone will direct you to the appropriate testing line. Stay in your car and someone will be with you shortly.  If you have labs (blood work) drawn today and your tests are completely normal, you will receive your results only by: Marland Kitchen MyChart Message (if you have MyChart) OR . A paper copy in the mail If you have any lab test that is abnormal or we need to change your treatment, we will call you to review the results.  Testing/Procedures: Your physician has requested that you have a cardiac catheterization. Cardiac catheterization is used to diagnose and/or treat various heart conditions. Doctors may recommend this procedure for a number of different reasons. The most common reason is to evaluate chest pain. Chest pain can be a symptom of coronary artery disease (CAD), and cardiac catheterization can show whether plaque is narrowing or blocking your heart's arteries. This procedure is also used to evaluate the valves, as well as measure the blood flow and oxygen levels in different parts of your heart. For further information please visit HugeFiesta.tn. Please follow instruction sheet, as given.   Follow-Up: At [facility], you and your health needs are our priority.  As part of our continuing mission to provide you with exceptional heart care, we have created designated Provider Care Teams.  These Care Teams include your primary Cardiologist (physician) and Advanced Practice Providers (APPs -  Physician Assistants and Nurse Practitioners) who all work together to provide you with the care you need, when you need it.  Your next appointment:   1 month(s)  The format for your next appointment:   In Person  Provider:   Kirk Ruths, MD  or APP   Other Instructions       [address] [place] [address] Dept: [phone] Loc: [phone]  REEM FLEURY  11/24/2020  You are scheduled for a Cardiac Catheterization on Tuesday, March 22 with Dr. Shelva Majestic.  1. Please arrive at the [facility] (Main Entrance A) at [facility]: [address] [place], [place] [address] at 7:00 AM (This time is two hours before your procedure to ensure your preparation). Free valet parking service is available.   Special note: Every effort is made to have your procedure done on time. Please understand that emergencies sometimes delay scheduled procedures.  2. Diet: Do not eat solid foods after midnight.  The patient may have clear liquids until 5am upon the day of the procedure.  3. Labs: You will need to have blood drawn on Friday, March 18 at [place]  Open: [place] (Lunch 12:30 - 1:30)   Phone: [phone]. You do not need to be fasting.  4. Medication instructions in preparation for your procedure:   Contrast Allergy: No   On the morning of your procedure, take your Aspirin and any morning medicines NOT listed above.  You may use sips of water.  5. Plan for one night stay--bring personal belongings. 6. Bring a current list of your medications and current insurance cards. 7. You MUST have a responsible person to drive you home. 8. Someone MUST be with you the first 24 hours after you arrive home or your discharge will be delayed. 9. Please wear clothes that are easy to get on and off and wear slip-on shoes.  Thank you for allowing [place] to care for you!   -- [facility] Invasive Cardiovascular services      Signed, Almyra Deforest, [place]  11/26/2020 11:27 PM    [place]

## 2020-11-24 NOTE — Patient Instructions (Signed)
Medication Instructions:  Your physician recommends that you continue on your current medications as directed. Please refer to the Current Medication list given to you today.  *If you need a refill on your cardiac medications before your next appointment, please call your pharmacy*  Lab Work: Your physician recommends that you return for lab work in:  BMET  CBC You will need a COVID-19  test prior to your procedure. You are scheduled for 11/24/20 at 1:35 PM. This is a Drive Up Visit at [id number] [place], [place] [address]. Someone will direct you to the appropriate testing line. Stay in your car and someone will be with you shortly.  If you have labs (blood work) drawn today and your tests are completely normal, you will receive your results only by: Marland Kitchen MyChart Message (if you have MyChart) OR . A paper copy in the mail If you have any lab test that is abnormal or we need to change your treatment, we will call you to review the results.  Testing/Procedures: Your physician has requested that you have a cardiac catheterization. Cardiac catheterization is used to diagnose and/or treat various heart conditions. Doctors may recommend this procedure for a number of different reasons. The most common reason is to evaluate chest pain. Chest pain can be a symptom of coronary artery disease (CAD), and cardiac catheterization can show whether plaque is narrowing or blocking your heart's arteries. This procedure is also used to evaluate the valves, as well as measure the blood flow and oxygen levels in different parts of your heart. For further information please visit HugeFiesta.tn. Please follow instruction sheet, as given.   Follow-Up: At [facility], you and your health needs are our priority.  As part of our continuing mission to provide you with exceptional heart care, we have created designated Provider Care Teams.  These Care Teams include your primary Cardiologist (physician) and  Advanced Practice Providers (APPs -  Physician Assistants and Nurse Practitioners) who all work together to provide you with the care you need, when you need it.  Your next appointment:   1 month(s)  The format for your next appointment:   In Person  Provider:   Kirk Ruths, MD or APP   Other Instructions       [place] [address] Dept: [phone] Loc: [phone]  CHRISTLYN AMMAR  11/24/2020  You are scheduled for a Cardiac Catheterization on Tuesday, March 22 with Dr. Shelva Majestic.  1. Please arrive at the [facility] (Main Entrance A) at [facility]: [address] [place], [place] [address] at 7:00 AM (This time is two hours before your procedure to ensure your preparation). Free valet parking service is available.   Special note: Every effort is made to have your procedure done on time. Please understand that emergencies sometimes delay scheduled procedures.  2. Diet: Do not eat solid foods after midnight.  The patient may have clear liquids until 5am upon the day of the procedure.  3. Labs: You will need to have blood drawn on Friday, March 18 at [place]  Open: [place] (Lunch 12:30 - 1:30)   Phone: [phone]. You do not need to be fasting.  4. Medication instructions in preparation for your procedure:   Contrast Allergy: No   On the morning of your procedure, take your Aspirin and any morning medicines NOT listed above.  You may use sips of water.  5. Plan for one night stay--bring personal belongings. 6. Bring a current list of your medications and current insurance cards. 7. You MUST have a responsible person to drive you home. 8. Someone MUST be with you the first 24 hours after you arrive home or your discharge will be delayed. 9. Please wear clothes that are easy to get on and off and  wear slip-on shoes.  Thank you for allowing [place] to care for you!   -- [place] Invasive Cardiovascular services

## 2020-11-25 LAB — CBC
Hematocrit: 25.8 % — ABNORMAL LOW (ref 34.0–46.6)
Hemoglobin: 8.4 g/dL — ABNORMAL LOW (ref 11.1–15.9)
MCH: 28.9 pg (ref 26.6–33.0)
MCHC: 32.6 g/dL (ref 31.5–35.7)
MCV: 89 fL (ref 79–97)
Platelets: 376 10*3/uL (ref 150–450)
RBC: 2.91 x10E6/uL — ABNORMAL LOW (ref 3.77–5.28)
RDW: 17 % — ABNORMAL HIGH (ref 11.7–15.4)
WBC: 9 10*3/uL (ref 3.4–10.8)

## 2020-11-25 LAB — BASIC METABOLIC PANEL
BUN/Creatinine Ratio: 8 — ABNORMAL LOW (ref 12–28)
BUN: 47 mg/dL — ABNORMAL HIGH (ref 8–27)
CO2: 23 mmol/L (ref 20–29)
Calcium: 9.4 mg/dL (ref 8.7–10.3)
Chloride: 96 mmol/L (ref 96–106)
Creatinine, Ser: 6.1 mg/dL — ABNORMAL HIGH (ref 0.57–1.00)
Glucose: 131 mg/dL — ABNORMAL HIGH (ref 65–99)
Potassium: 4.5 mmol/L (ref 3.5–5.2)
Sodium: 141 mmol/L (ref 134–144)
eGFR: 7 mL/min/{1.73_m2} — ABNORMAL LOW (ref >59)

## 2020-11-26 ENCOUNTER — Encounter: Payer: Self-pay | Admitting: Physician Assistant

## 2020-11-26 ENCOUNTER — Other Ambulatory Visit: Payer: Self-pay | Admitting: Physician Assistant

## 2020-11-26 MED ORDER — SODIUM CHLORIDE 0.9% FLUSH: 3.0000 mL | Freq: Two times a day (BID) | INTRAVENOUS | Status: DC

## 2020-11-27 ENCOUNTER — Telehealth: Payer: Self-pay | Admitting: *Deleted

## 2020-11-27 DIAGNOSIS — R404 Transient alteration of awareness: Secondary | ICD-10-CM | POA: Diagnosis not present

## 2020-11-27 DIAGNOSIS — I959 Hypotension, unspecified: Secondary | ICD-10-CM | POA: Diagnosis not present

## 2020-11-27 DIAGNOSIS — R55 Syncope and collapse: Secondary | ICD-10-CM | POA: Diagnosis not present

## 2020-11-27 DIAGNOSIS — R9089 Other abnormal findings on diagnostic imaging of central nervous system: Secondary | ICD-10-CM | POA: Diagnosis not present

## 2020-11-27 DIAGNOSIS — I517 Cardiomegaly: Secondary | ICD-10-CM | POA: Diagnosis not present

## 2020-11-27 DIAGNOSIS — R0902 Hypoxemia: Secondary | ICD-10-CM | POA: Diagnosis not present

## 2020-11-27 DIAGNOSIS — R531 Weakness: Secondary | ICD-10-CM | POA: Diagnosis not present

## 2020-11-27 DIAGNOSIS — J90 Pleural effusion, not elsewhere classified: Secondary | ICD-10-CM | POA: Diagnosis not present

## 2020-11-27 DIAGNOSIS — R42 Dizziness and giddiness: Secondary | ICD-10-CM | POA: Diagnosis not present

## 2020-11-27 DIAGNOSIS — I9589 Other hypotension: Secondary | ICD-10-CM | POA: Diagnosis not present

## 2020-11-27 DIAGNOSIS — R0989 Other specified symptoms and signs involving the circulatory and respiratory systems: Secondary | ICD-10-CM | POA: Diagnosis not present

## 2020-11-27 MED FILL — rOPINIRole HCL 2 MG TABS: 2 | 30 days supply | Qty: 60 | Fill #0

## 2020-11-27 NOTE — Progress Notes (Signed)
Anemia of chronic disease is very stable, Cr is elevated because patient is on dialysis for end stage renal disease. No contraindication to cath

## 2020-11-27 NOTE — Telephone Encounter (Addendum)
Pt contacted pre-catheterization scheduled at [facility] for: Tuesday November 28, 2020 9 AM Verified arrival time and place: [facility]) at: 6 AM-DOS COVID*   No solid food after midnight prior to cath, clear liquids until 5 AM day of procedure.  Hold: Losartan-AM of procedure  Except hold medications AM meds can be  taken pre-cath with sips of water including: ASA 81 mg   Confirmed patient has responsible adult to drive home post procedure and be with patient first 24 hours after arriving home: yes  You are allowed ONE visitor in the waiting room during the time you are at the hospital for your procedure. Both you and your visitor must wear a mask once you enter the hospital.   Reviewed procedure/mask/visitor instructions with patient.    * COVID test  11/24/20 was collected but not run, per Anderson Malta, RN [facility] lab, ask patient to arrive November 28, 2020  6 AM for DOS COVID prior to procedure at 9 AM-pt aware.

## 2020-11-28 ENCOUNTER — Encounter (HOSPITAL_COMMUNITY)
Admission: RE | Disposition: A | Discharge status: Home / Self Care | Payer: Self-pay | Source: Ambulatory Visit | Attending: Cardiovascular Disease

## 2020-11-28 ENCOUNTER — Ambulatory Visit (HOSPITAL_COMMUNITY)
Admission: RE | Admit: 2020-11-28 | Discharge: 2020-11-28 | Disposition: A | Discharge status: Home / Self Care | Payer: Medicare Other | Source: Ambulatory Visit | Attending: Cardiovascular Disease | Admitting: Cardiovascular Disease

## 2020-11-28 ENCOUNTER — Other Ambulatory Visit: Payer: Self-pay

## 2020-11-28 DIAGNOSIS — Z79899 Other long term (current) drug therapy: Secondary | ICD-10-CM | POA: Insufficient documentation

## 2020-11-28 DIAGNOSIS — I9589 Other hypotension: Secondary | ICD-10-CM | POA: Diagnosis not present

## 2020-11-28 DIAGNOSIS — Z8673 Personal history of transient ischemic attack (TIA), and cerebral infarction without residual deficits: Secondary | ICD-10-CM | POA: Insufficient documentation

## 2020-11-28 DIAGNOSIS — Z888 Allergy status to other drugs, medicaments and biological substances status: Secondary | ICD-10-CM | POA: Insufficient documentation

## 2020-11-28 DIAGNOSIS — Z20822 Contact with and (suspected) exposure to covid-19: Secondary | ICD-10-CM | POA: Insufficient documentation

## 2020-11-28 DIAGNOSIS — I5021 Acute systolic (congestive) heart failure: Secondary | ICD-10-CM | POA: Insufficient documentation

## 2020-11-28 DIAGNOSIS — I132 Hypertensive heart and chronic kidney disease with heart failure and with stage 5 chronic kidney disease, or end stage renal disease: Secondary | ICD-10-CM | POA: Insufficient documentation

## 2020-11-28 DIAGNOSIS — I2510 Atherosclerotic heart disease of native coronary artery without angina pectoris: Secondary | ICD-10-CM | POA: Diagnosis not present

## 2020-11-28 DIAGNOSIS — Z992 Dependence on renal dialysis: Secondary | ICD-10-CM | POA: Insufficient documentation

## 2020-11-28 DIAGNOSIS — E785 Hyperlipidemia, unspecified: Secondary | ICD-10-CM | POA: Diagnosis not present

## 2020-11-28 DIAGNOSIS — N186 End stage renal disease: Secondary | ICD-10-CM

## 2020-11-28 DIAGNOSIS — F17210 Nicotine dependence, cigarettes, uncomplicated: Secondary | ICD-10-CM | POA: Insufficient documentation

## 2020-11-28 DIAGNOSIS — Z885 Allergy status to narcotic agent status: Secondary | ICD-10-CM | POA: Insufficient documentation

## 2020-11-28 DIAGNOSIS — J449 Chronic obstructive pulmonary disease, unspecified: Secondary | ICD-10-CM | POA: Insufficient documentation

## 2020-11-28 DIAGNOSIS — E1122 Type 2 diabetes mellitus with diabetic chronic kidney disease: Secondary | ICD-10-CM | POA: Diagnosis not present

## 2020-11-28 DIAGNOSIS — I429 Cardiomyopathy, unspecified: Secondary | ICD-10-CM | POA: Insufficient documentation

## 2020-11-28 DIAGNOSIS — Z7982 Long term (current) use of aspirin: Secondary | ICD-10-CM | POA: Diagnosis not present

## 2020-11-28 HISTORY — PX: LEFT HEART CATH AND CORONARY ANGIOGRAPHY: CATH118249

## 2020-11-28 LAB — GLUCOSE, CAPILLARY
Glucose-Capillary: 102 mg/dL — ABNORMAL HIGH (ref 70–99)
Glucose-Capillary: 109 mg/dL — ABNORMAL HIGH (ref 70–99)

## 2020-11-28 LAB — SARS CORONAVIRUS 2 BY RT PCR (HOSPITAL ORDER, PERFORMED IN ~~LOC~~ HOSPITAL LAB): SARS Coronavirus 2: NEGATIVE

## 2020-11-28 SURGERY — LEFT HEART CATH AND CORONARY ANGIOGRAPHY
Anesthesia: LOCAL

## 2020-11-28 MED ORDER — IOHEXOL 350 MG/ML SOLN
INTRAVENOUS | Status: DC | PRN
  Administered 2020-11-28: 60 mL

## 2020-11-28 MED ORDER — SODIUM CHLORIDE 0.9% FLUSH: 3.0000 mL | Freq: Two times a day (BID) | INTRAVENOUS | Status: DC

## 2020-11-28 MED ORDER — ASPIRIN 81 MG PO CHEW: 81.0000 mg | CHEWABLE_TABLET | ORAL | Status: DC

## 2020-11-28 MED ORDER — ONDANSETRON HCL 4 MG/2ML IJ SOLN: 4.0000 mg | Freq: Four times a day (QID) | INTRAMUSCULAR | Status: DC | PRN

## 2020-11-28 MED ORDER — ATORVASTATIN CALCIUM 80 MG PO TABS: 80.0000 mg | ORAL_TABLET | Freq: Every day | ORAL | Status: DC

## 2020-11-28 MED ORDER — LIDOCAINE HCL (PF) 1 % IJ SOLN
INTRAMUSCULAR | Status: DC | PRN
  Administered 2020-11-28: 15 mL via INTRADERMAL

## 2020-11-28 MED ORDER — HEPARIN (PORCINE) IN NACL 1000-0.9 UT/500ML-% IV SOLN
INTRAVENOUS | Status: DC | PRN
  Administered 2020-11-28 (×2): 500 mL

## 2020-11-28 MED ORDER — FENTANYL CITRATE (PF) 100 MCG/2ML IJ SOLN
INTRAMUSCULAR | Status: AC
  Filled 2020-11-28: qty 2

## 2020-11-28 MED ORDER — SODIUM CHLORIDE 0.9 % IV SOLN: 250.0000 mL | INTRAVENOUS | Status: DC | PRN

## 2020-11-28 MED ORDER — MIDAZOLAM HCL 2 MG/2ML IJ SOLN
INTRAMUSCULAR | Status: DC | PRN
  Administered 2020-11-28 (×2): 1 mg via INTRAVENOUS

## 2020-11-28 MED ORDER — ASPIRIN 81 MG PO CHEW: 81.0000 mg | CHEWABLE_TABLET | Freq: Every day | ORAL | Status: DC

## 2020-11-28 MED ORDER — SODIUM CHLORIDE 0.9 % IV SOLN: INTRAVENOUS | Status: DC

## 2020-11-28 MED ORDER — DIAZEPAM 5 MG PO TABS
5.0000 mg | ORAL_TABLET | ORAL | Status: DC | PRN
  Administered 2020-11-28: 5 mg via ORAL
  Filled 2020-11-28: qty 1

## 2020-11-28 MED ORDER — SODIUM CHLORIDE 0.9% FLUSH: 3.0000 mL | INTRAVENOUS | Status: DC | PRN

## 2020-11-28 MED ORDER — HYDRALAZINE HCL 20 MG/ML IJ SOLN
INTRAMUSCULAR | Status: DC | PRN
  Administered 2020-11-28 (×2): 10 mg via INTRAVENOUS

## 2020-11-28 MED ORDER — HYDRALAZINE HCL 20 MG/ML IJ SOLN
INTRAMUSCULAR | Status: AC
  Filled 2020-11-28: qty 1

## 2020-11-28 MED ORDER — HEPARIN (PORCINE) IN NACL 1000-0.9 UT/500ML-% IV SOLN
INTRAVENOUS | Status: AC
  Filled 2020-11-28: qty 1000

## 2020-11-28 MED ORDER — LABETALOL HCL 5 MG/ML IV SOLN: 10.0000 mg | INTRAVENOUS | Status: DC | PRN

## 2020-11-28 MED ORDER — HYDRALAZINE HCL 20 MG/ML IJ SOLN: 10.0000 mg | INTRAMUSCULAR | Status: DC | PRN

## 2020-11-28 MED ORDER — ACETAMINOPHEN 325 MG PO TABS: 650.0000 mg | ORAL_TABLET | ORAL | Status: DC | PRN

## 2020-11-28 MED ORDER — LIDOCAINE HCL (PF) 1 % IJ SOLN
INTRAMUSCULAR | Status: AC
  Filled 2020-11-28: qty 30

## 2020-11-28 MED ORDER — FENTANYL CITRATE (PF) 100 MCG/2ML IJ SOLN
INTRAMUSCULAR | Status: DC | PRN
  Administered 2020-11-28 (×2): 25 ug via INTRAVENOUS

## 2020-11-28 MED ORDER — MIDAZOLAM HCL 2 MG/2ML IJ SOLN
INTRAMUSCULAR | Status: AC
  Filled 2020-11-28: qty 2

## 2020-11-28 SURGICAL SUPPLY — 8 items
CATH INFINITI 5FR MULTPACK ANG (CATHETERS) ×1 IMPLANT
KIT HEART LEFT (KITS) ×2 IMPLANT
PACK CARDIAC CATHETERIZATION (CUSTOM PROCEDURE TRAY) ×2 IMPLANT
SHEATH PINNACLE 5F 10CM (SHEATH) ×1 IMPLANT
SHEATH PROBE COVER 6X72 (BAG) ×1 IMPLANT
TRANSDUCER W/STOPCOCK (MISCELLANEOUS) ×2 IMPLANT
TUBING CIL FLEX 10 FLL-RA (TUBING) ×2 IMPLANT
WIRE EMERALD 3MM-J .035X150CM (WIRE) ×1 IMPLANT

## 2020-11-28 NOTE — Discharge Instructions (Signed)
Angiogram, Care After This sheet gives you information about how to care for yourself after your procedure. Your health care provider may also give you more specific instructions. If you have problems or questions, contact your health care provider. What can I expect after the procedure? After the procedure, it is common to have:  Bruising and tenderness at the catheter insertion area.  A collection of blood (hematoma) at the insertion area. This may feel like a small lump under the skin at the insertion site. Follow these instructions at home: Insertion site care  Follow instructions from your health care provider about how to take care of your insertion site. Make sure you: ? Wash your hands with soap and water before and after you change your bandage (dressing). If soap and water are not available, use hand sanitizer. ? Change your dressing as told by your health care provider.  Do not take baths, swim, or use a hot tub until your health care provider approves.  You may shower 24-48 hours after the procedure, or as told by your health care provider. To clean the insertion site: ? Gently wash the area with plain soap and water. ? Pat the area dry with a clean towel. ? Do not rub the site. This may cause bleeding.  Check your insertion site every day for signs of infection. Check for: ? Redness, swelling, or pain. ? Fluid or blood. ? Warmth. ? Pus or a bad smell.  Do not apply powder or lotion to the site. Keep the site clean and dry.   Activity  Do not drive for 24 hours if you were given a sedative during your procedure.  Rest as told by your health care provider, usually for 1-2 days.  Do not lift anything that is heavier than 10 lb (4.5 kg), or the limit that you are told, until your health care provider says that it is safe.  If the insertion site was in your leg, try to avoid stairs for a few days.  Return to your normal activities as told by your health care provider,  usually in about a week. Ask your health care provider what activities are safe for you. General instructions  If your insertion site starts bleeding, lie flat and put pressure on the site. If the bleeding does not stop, get help right away. This is a medical emergency.  Take over-the-counter and prescription medicines only as told by your health care provider.  Drink enough fluid to keep your urine pale yellow. This helps flush the contrast dye from your body.  Keep all follow-up visits as told by your health care provider. This is important.   Contact a health care provider if:  You have a fever or chills.  You have redness, swelling, or pain around your insertion site.  You have fluid or blood coming from your insertion site.  Your insertion site feels warm to the touch.  You have pus or a bad smell coming from your insertion site.  You have more bruising around the insertion site. Get help right away if you have:  A problem with the insertion area, such as: ? The area swells fast or bleeds even after you apply pressure. ? The area becomes pale, cool, tingly, or numb.  Chest pain.  Trouble breathing.  A rash.  Any symptoms of a stroke. "BE FAST" is an easy way to remember the main warning signs of a stroke: ? B - Balance. Signs are dizziness, sudden trouble walking,   or loss of balance. ? E - Eyes. Signs are trouble seeing or a sudden change in vision. ? F - Face. Signs are sudden weakness or loss of feeling of the face, or the face or eyelid drooping on one side. ? A - Arms. Signs are weakness or loss of feeling in an arm. This happens suddenly and usually on one side of the body. ? S - Speech. Signs are sudden trouble speaking, slurred speech, or trouble understanding what people say. ? T - Time. Time to call emergency services. Write down what time symptoms started.  You have other signs of a stroke, such as: ? A sudden, severe headache with no known cause. ? Nausea  or vomiting. ? Seizure. These symptoms may represent a serious problem that is an emergency. Do not wait to see if the symptoms will go away. Get medical help right away. Call your local emergency services (911 in the [place] Do not drive yourself to the hospital. Summary  It is common to have bruising and tenderness at the catheter insertion area.  Do not take baths, swim, or use a hot tub until your health care provider approves. You may shower 24-48 hours after the procedure or as told.  It is important to rest and drink plenty of fluids.  If the insertion site bleeds, lie flat and put pressure on the site. If the bleeding continues, get help right away. This is a medical emergency. This information is not intended to replace advice given to you by your health care provider. Make sure you discuss any questions you have with your health care provider. Document Revised: 06/30/2019 Document Reviewed: 06/30/2019 Elsevier Patient Education  2021 [employer].  

## 2020-11-28 NOTE — Progress Notes (Addendum)
19f sheath aspirated and removed from rfa, manual pressure applied for 45 minutes.  Manual pressure was applied for 45 minutes due to restless body/leg. She has constant movement in her leg and hips. Tegaderm dressing applied. Bedrest instructions given.    Bilateral dp pulses present, pt pulses weak.   Bedrest begins at 12:15:00   Patient reminded not to move her legs. Not to lift her leg of the bed.

## 2020-11-28 NOTE — Interval H&P Note (Signed)
Cath Lab Visit (complete for each Cath Lab visit)  Clinical Evaluation Leading to the Procedure:   ACS: No.  Non-ACS:    Anginal Classification: CCS II  Anti-ischemic medical therapy: Minimal Therapy (1 class of medications)  Non-Invasive Test Results: No non-invasive testing performed  Prior CABG: No previous CABG      History and Physical Interval Note:  11/28/2020 10:23 AM  Tina Gomez  has presented today for surgery, with the diagnosis of LV disfunction.  The various methods of treatment have been discussed with the patient and family. After consideration of risks, benefits and other options for treatment, the patient has consented to  Procedure(s): LEFT HEART CATH AND CORONARY ANGIOGRAPHY (N/A) as a surgical intervention.  The patient's history has been reviewed, patient examined, no change in status, stable for surgery.  I have reviewed the patient's chart and labs.  Questions were answered to the patient's satisfaction.     Shelva Majestic

## 2020-11-28 NOTE — Progress Notes (Signed)
Pt put on call light, right hand IV was out and blood all over. Pt is very anxious and has restless leg syndrome, unable to get second IV per Orma Flaming RN, IV team order placed, Eliberto IvoryNori Riis RN/ cath lab was messaged to see if Dr Claiborne Billings could order something for anxiety. Pt is twitching/ jerking legs and arms and sighing with discomfort. Awaiting reply for orders.

## 2020-11-28 NOTE — Progress Notes (Signed)
Pt has restless leg syndrome, I have given valium as prescribed, pt is requesting to take home dose of tramadol 50 mg, I called PA cards/ Mendel Ryder and made request, she said pt can take her home med, pt took med given to her by her daughter.

## 2020-11-29 ENCOUNTER — Encounter (HOSPITAL_COMMUNITY): Payer: Self-pay | Admitting: Cardiovascular Disease

## 2020-11-30 ENCOUNTER — Ambulatory Visit (INDEPENDENT_AMBULATORY_CARE_PROVIDER_SITE_OTHER): Payer: Medicaid Other | Admitting: Physician Assistant

## 2020-11-30 ENCOUNTER — Other Ambulatory Visit: Payer: Self-pay

## 2020-11-30 ENCOUNTER — Ambulatory Visit (HOSPITAL_COMMUNITY)
Admission: RE | Admit: 2020-11-30 | Discharge: 2020-11-30 | Disposition: A | Discharge status: Home / Self Care | Payer: Medicare Other | Source: Ambulatory Visit | Attending: Vascular Surgery | Admitting: Vascular Surgery

## 2020-11-30 VITALS — BP 181/70 | HR 64 | Temp 98.3°F | Resp 20 | Ht 62.0 in | Wt 171.0 lb

## 2020-11-30 DIAGNOSIS — N186 End stage renal disease: Secondary | ICD-10-CM | POA: Insufficient documentation

## 2020-11-30 DIAGNOSIS — Z992 Dependence on renal dialysis: Secondary | ICD-10-CM

## 2020-11-30 NOTE — Progress Notes (Signed)
    Postoperative Access Visit   History of Present Illness   Tina Gomez is a 65 y.o. year old female who presents for postoperative follow-up for: left second stage basilic vein transposition by Dr. Stanford Breed 11/02/20.  The patient's wounds are well healed.  The patient notes very minimal steal symptoms. She has a little numbness in her left 5th finger. This has been present since her initial AV fistula surgery. She otherwise has a little soreness up in axilla along incision but it is improving.  She dialyzes via right IJ TDC on MWF at the [facility] location. Nephrologist is Dr. Moshe Cipro.  Physical Examination   Vitals:   11/30/20 1453  BP: (!) 181/70  Pulse: 64  Resp: 20  Temp: 98.3 F (36.8 C)  TempSrc: Temporal  SpO2: 97%  Weight: 171 lb (77.6 kg)  Height: '5\' 2"'$  (1.575 m)   Body mass index is 31.28 kg/m.  left arm Incision is healed, 2+ radial pulse, hand grip is 5/5, sensation in digits is intact, palpable thrill, bruit can be auscultated    Non Invasive Study: Findings:  +--------------------+----------+-----------------+--------+  AVF         PSV (cm/s)Flow Vol (mL/min)Comments  +--------------------+----------+-----------------+--------+  Native artery inflow  237     1454          +--------------------+----------+-----------------+--------+  AVF Anastomosis     682                 +--------------------+----------+-----------------+--------+     +------------+----------+-------------+----------+--------+  OUTFLOW VEINPSV (cm/s)Diameter (cm)Depth (cm)Describe  +------------+----------+-------------+----------+--------+  Shoulder    156    1.06     1.02        +------------+----------+-------------+----------+--------+  Prox UA     530    0.60     0.38  stenotic  +------------+----------+-------------+----------+--------+  Mid UA     195    0.50      0.18        +------------+----------+-------------+----------+--------+  Dist UA     295    0.51     0.22        +------------+----------+-------------+----------+--------+  AC Fossa    545    0.44     1.06        +------------+----------+-------------+----------+--------+   Patent AV fistula  Medical Decision Making   ROWAN LUEBBE is a 65 y.o. year old female who presents s/p left second stage basilic vein transposition by Dr. Stanford Breed 11/02/20. Today's duplex shows patent functioning AV fistula with good volume flow and adequate depth and diameter   Patent is without signs or symptoms of steal syndrome  The patient's access will be ready for use immediately  The patient's tunneled dialysis catheter can be removed when Nephrology is comfortable with the performance of the left AV fistula. Please contact [facility] to schedule removal of this at your discretion  The patient may follow up on a prn basis   Karoline Caldwell, PA-C [facility] Office: [facility] MD: Laqueta Due

## 2020-12-06 ENCOUNTER — Other Ambulatory Visit (HOSPITAL_COMMUNITY): Payer: Self-pay | Admitting: Nephrology

## 2020-12-07 DIAGNOSIS — Z992 Dependence on renal dialysis: Secondary | ICD-10-CM | POA: Diagnosis not present

## 2020-12-07 DIAGNOSIS — N186 End stage renal disease: Secondary | ICD-10-CM | POA: Diagnosis not present

## 2020-12-07 DIAGNOSIS — E1122 Type 2 diabetes mellitus with diabetic chronic kidney disease: Secondary | ICD-10-CM | POA: Diagnosis not present

## 2020-12-09 ENCOUNTER — Other Ambulatory Visit: Payer: Self-pay

## 2020-12-12 ENCOUNTER — Encounter: Payer: Self-pay | Admitting: Family Medicine

## 2020-12-12 ENCOUNTER — Other Ambulatory Visit: Payer: Self-pay

## 2020-12-12 ENCOUNTER — Ambulatory Visit: Payer: Medicaid Other | Attending: Family Medicine | Admitting: Family Medicine

## 2020-12-12 VITALS — BP 196/89 | HR 72 | Ht 62.0 in | Wt 175.2 lb

## 2020-12-12 DIAGNOSIS — I110 Hypertensive heart disease with heart failure: Secondary | ICD-10-CM | POA: Diagnosis not present

## 2020-12-12 DIAGNOSIS — Z992 Dependence on renal dialysis: Secondary | ICD-10-CM | POA: Diagnosis not present

## 2020-12-12 DIAGNOSIS — E785 Hyperlipidemia, unspecified: Secondary | ICD-10-CM | POA: Diagnosis not present

## 2020-12-12 DIAGNOSIS — J439 Emphysema, unspecified: Secondary | ICD-10-CM

## 2020-12-12 DIAGNOSIS — F419 Anxiety disorder, unspecified: Secondary | ICD-10-CM | POA: Diagnosis not present

## 2020-12-12 DIAGNOSIS — G2581 Restless legs syndrome: Secondary | ICD-10-CM | POA: Diagnosis not present

## 2020-12-12 DIAGNOSIS — I5021 Acute systolic (congestive) heart failure: Secondary | ICD-10-CM

## 2020-12-12 DIAGNOSIS — N186 End stage renal disease: Secondary | ICD-10-CM | POA: Diagnosis not present

## 2020-12-12 DIAGNOSIS — E1169 Type 2 diabetes mellitus with other specified complication: Secondary | ICD-10-CM | POA: Diagnosis not present

## 2020-12-12 DIAGNOSIS — E1122 Type 2 diabetes mellitus with diabetic chronic kidney disease: Secondary | ICD-10-CM

## 2020-12-12 MED ORDER — GABAPENTIN 100 MG PO CAPS
100.0000 mg | ORAL_CAPSULE | Freq: Every day | ORAL | 3 refills | Status: DC
Start: 2020-12-12 — End: 2021-01-04
  Filled 2020-12-12: qty 30, 30d supply, fill #0

## 2020-12-12 MED ORDER — HYDROXYZINE HCL 25 MG PO TABS
25.0000 mg | ORAL_TABLET | Freq: Three times a day (TID) | ORAL | 1 refills | Status: DC | PRN
  Filled 2020-12-12: qty 60, 20d supply, fill #0
  Filled 2021-01-24: qty 60, 20d supply, fill #1

## 2020-12-12 MED ORDER — ALBUTEROL SULFATE HFA 108 (90 BASE) MCG/ACT IN AERS
2.0000 | INHALATION_SPRAY | Freq: Four times a day (QID) | RESPIRATORY_TRACT | 1 refills | Status: DC | PRN
  Filled 2020-12-12: qty 8.5, 25d supply, fill #0
  Filled 2021-03-29: qty 8.5, 25d supply, fill #1

## 2020-12-12 MED ORDER — FLUOXETINE HCL 20 MG PO CAPS
20.0000 mg | ORAL_CAPSULE | Freq: Every day | ORAL | 3 refills | Status: DC
  Filled 2020-12-12: qty 30, 30d supply, fill #0
  Filled 2021-01-09: qty 30, 30d supply, fill #1
  Filled 2021-03-29: qty 30, 30d supply, fill #2
  Filled 2021-05-18: qty 30, 30d supply, fill #3

## 2020-12-12 MED ORDER — ALBUTEROL SULFATE (2.5 MG/3ML) 0.083% IN NEBU
2.5000 mg | INHALATION_SOLUTION | Freq: Four times a day (QID) | RESPIRATORY_TRACT | 1 refills | Status: DC | PRN
  Filled 2020-12-12: qty 75, 7d supply, fill #0
  Filled 2021-01-24: qty 75, 7d supply, fill #1

## 2020-12-12 NOTE — Patient Instructions (Signed)
[email or web address]">  Generalized Anxiety Disorder, Adult Generalized anxiety disorder (GAD) is a mental health condition. Unlike normal worries, anxiety related to GAD is not triggered by a specific event. These worries do not fade or get better with time. GAD interferes with relationships, work, and school. GAD symptoms can vary from mild to severe. People with severe GAD can have intense waves of anxiety with physical symptoms that are similar to panic attacks. What are the causes? The exact cause of GAD is not known, but the following are believed to have an impact:  Differences in natural brain chemicals.  Genes passed down from parents to children.  Differences in the way threats are perceived.  Development during childhood.  Personality. What increases the risk? The following factors may make you more likely to develop this condition:  Being female.  Having a family history of anxiety disorders.  Being very shy.  Experiencing very stressful life events, such as the death of a loved one.  Having a very stressful family environment. What are the signs or symptoms? People with GAD often worry excessively about many things in their lives, such as their health and family. Symptoms may also include:  Mental and emotional symptoms: ? Worrying excessively about natural disasters. ? Fear of being late. ? Difficulty concentrating. ? Fears that others are judging your performance.  Physical symptoms: ? Fatigue. ? Headaches, muscle tension, muscle twitches, trembling, or feeling shaky. ? Feeling like your heart is pounding or beating very fast. ? Feeling out of breath or like you cannot take a deep breath. ? Having trouble falling asleep or staying asleep, or experiencing restlessness. ? Sweating. ? Nausea, diarrhea, or irritable bowel syndrome (IBS).  Behavioral symptoms: ? Experiencing erratic moods or irritability. ? Avoidance of new situations. ? Avoidance of  people. ? Extreme difficulty making decisions. How is this diagnosed? This condition is diagnosed based on your symptoms and medical history. You will also have a physical exam. Your health care provider may perform tests to rule out other possible causes of your symptoms. To be diagnosed with GAD, a person must have anxiety that:  Is out of his or her control.  Affects several different aspects of his or her life, such as work and relationships.  Causes distress that makes him or her unable to take part in normal activities.  Includes at least three symptoms of GAD, such as restlessness, fatigue, trouble concentrating, irritability, muscle tension, or sleep problems. Before your health care provider can confirm a diagnosis of GAD, these symptoms must be present more days than they are not, and they must last for 6 months or longer. How is this treated? This condition may be treated with:  Medicine. Antidepressant medicine is usually prescribed for long-term daily control. Anti-anxiety medicines may be added in severe cases, especially when panic attacks occur.  Talk therapy (psychotherapy). Certain types of talk therapy can be helpful in treating GAD by providing support, education, and guidance. Options include: ? Cognitive behavioral therapy (CBT). People learn coping skills and self-calming techniques to ease their physical symptoms. They learn to identify unrealistic thoughts and behaviors and to replace them with more appropriate thoughts and behaviors. ? Acceptance and commitment therapy (ACT). This treatment teaches people how to be mindful as a way to cope with unwanted thoughts and feelings. ? Biofeedback. This process trains you to manage your body's response (physiological response) through breathing techniques and relaxation methods. You will work with a therapist while machines are used to monitor your physical   symptoms.  Stress management techniques. These include yoga,  meditation, and exercise. A mental health specialist can help determine which treatment is best for you. Some people see improvement with one type of therapy. However, other people require a combination of therapies.   Follow these instructions at home: Lifestyle  Maintain a consistent routine and schedule.  Anticipate stressful situations. Create a plan, and allow extra time to work with your plan.  Practice stress management or self-calming techniques that you have learned from your therapist or your health care provider. General instructions  Take over-the-counter and prescription medicines only as told by your health care provider.  Understand that you are likely to have setbacks. Accept this and be kind to yourself as you persist to take better care of yourself.  Recognize and accept your accomplishments, even if you judge them as small.  Keep all follow-up visits as told by your health care provider. This is important. Contact a health care provider if:  Your symptoms do not get better.  Your symptoms get worse.  You have signs of depression, such as: ? A persistently sad or irritable mood. ? Loss of enjoyment in activities that used to bring you joy. ? Change in weight or eating. ? Changes in sleeping habits. ? Avoiding friends or family members. ? Loss of energy for normal tasks. ? Feelings of guilt or worthlessness. Get help right away if:  You have serious thoughts about hurting yourself or others. If you ever feel like you may hurt yourself or others, or have thoughts about taking your own life, get help right away. Go to your nearest emergency department or:  Call your local emergency services (911 in the [place]  Call a suicide crisis helpline, such as the National Suicide Prevention Lifeline at 1-800-273-8255. This is open 24 hours a day in the [place]  Text the Crisis Text Line at 741741 (in the [place] Summary  Generalized anxiety disorder (GAD) is a mental  health condition that involves worry that is not triggered by a specific event.  People with GAD often worry excessively about many things in their lives, such as their health and family.  GAD may cause symptoms such as restlessness, trouble concentrating, sleep problems, frequent sweating, nausea, diarrhea, headaches, and trembling or muscle twitching.  A mental health specialist can help determine which treatment is best for you. Some people see improvement with one type of therapy. However, other people require a combination of therapies. This information is not intended to replace advice given to you by your health care provider. Make sure you discuss any questions you have with your health care provider. Document Revised: 06/16/2019 Document Reviewed: 06/16/2019 Elsevier Patient Education  2021 [employer] Inc.  

## 2020-12-12 NOTE — Progress Notes (Signed)
Subjective:  Patient ID: Tina Gomez, female    DOB: 07-18-1956  Age: 65 y.o. MRN: [id number]  CC: Hospitalization Follow-up   HPI Tina Gomez  is a65year-old female withahistory of hypertension, hyperlipidemia, type 2 diabetes mellitus (diet controlled with A1c6.4), GERD, ESRD on HD (M,W,F at [facility]) She is accompanied by her daughter to today's visit.  She was hospitalized at [facility] after she passed out at home post HD.  She underwent cardiac cath which revealed multivessel coronary calcifications involving all coronary arteries, EF of 30% out of proportion to her CAD.  Medication management recommended.  She has an upcoming appointment with her cardiologist.  States Dialysis days are awful and she feels like 'death' At night she has a hard time breathing as she is dyspneic when she tries to lie flat. At HD she is on Oxygen and she does well on it.  We have walked around the clinic but her oxygen saturation has not dropped to 88 but remains at 92 or above. Her pulse ox read 88% at home; MDI albuterol helps some and daughter used nebulizer machine with some relief. She denies presence of weight gain or pedal edema. On days when she receives hemodialysis she does not take her antihypertensive as recommended by her nephrologist as her blood pressure usually bottoms out.  Her blood pressure is significantly elevated today and she informs me she just took her antihypertensives a few minutes ago.  She has anxiety attacks which are exacerbating her breathing issues. She feels her whole body is restless, her legs are restless and she received 72m of Requip from her Nephrologist with no improvement in symptoms. They used Tramadol and it calmed her down, restless legs resolved and she was able to sleep. She has a sEducation officer, museumat her dialysis center who she talks to for psychotherapy.   Past Medical History:  Diagnosis Date  . Anemia   . Chronic kidney disease    . COPD (chronic obstructive pulmonary disease) ([facility] 05/2019   no inhaler  . Diabetes mellitus without complication ([place]    no meds - diet controlled  . History of blood transfusion 09/2020   1 unit  . HLD (hyperlipidemia)   . Hypertension   . PONV (postoperative nausea and vomiting)   . Restless legs syndrome (RLS)   . Stroke ([facility]     Past Surgical History:  Procedure Laterality Date  . AV FISTULA PLACEMENT Left 07/27/2020   Procedure: LEFT BRACHIO-BASILIC ARTERIOVENOUS (AV) FISTULA CREATION;  Surgeon: HCherre Robins MD;  [facility]  Service: Vascular;  Laterality: Left;  . BASCILIC VEIN TRANSPOSITION Left 11/02/2020   Procedure: LEFT UPPER ARM SECOND STAGE BASILIC VEIN TRANSPOSITION;  Surgeon: HCherre Robins MD;  [place] [facility]  Service: Vascular;  Laterality: Left;  PERIPHERAL NERVE BLOCK  . CESAREAN SECTION     x 1  . ESOPHAGOGASTRODUODENOSCOPY (EGD) WITH PROPOFOL N/A 05/26/2020   Procedure: ESOPHAGOGASTRODUODENOSCOPY (EGD) WITH PROPOFOL;  Surgeon: PIrene Shipper MD;  [facility];  Service: Endoscopy;  Laterality: N/A;  . INSERTION OF DIALYSIS CATHETER Right 07/27/2020   Procedure: INSERTION OF RIGHT INTERNAL JUGULAR TUNNELED DIALYSIS CATHETER;  Surgeon: HCherre Robins MD;  [place] [facility]  Service: Vascular;  Laterality: Right;  . IR FLUORO GUIDE CV LINE RIGHT  07/25/2020  . IR [place] VASC ACCESS RIGHT  07/25/2020  . LEFT HEART CATH AND CORONARY ANGIOGRAPHY N/A 11/28/2020   Procedure: LEFT HEART CATH AND CORONARY  ANGIOGRAPHY;  Surgeon: Troy Sine, MD;  Location: [place] CV LAB;  Service: Cardiovascular;  Laterality: N/A;  . TONSILLECTOMY    . UPPER GI ENDOSCOPY     growth removed from voice box  . WRIST SURGERY Left    ganglion cyst removal    Family History  Problem Relation Age of Onset  . Stroke Mother   . Hypertension Mother   . Hyperlipidemia Mother   . Hypertension Father   . Hyperlipidemia Father     Allergies   Allergen Reactions  . Codeine Nausea And Vomiting  . Adhesive [Tape] Other (See Comments)    Tape breaks out the skin if it is left on for a lengthy period of time    Outpatient Medications Prior to Visit  Medication Sig Dispense Refill  . aspirin 81 MG chewable tablet Chew 1 tablet (81 mg total) by mouth daily. (Patient taking differently: Chew 325 mg by mouth daily.) 30 tablet 0  . atorvastatin (LIPITOR) 40 MG tablet TAKE 1 TABLET BY MOUTH DAILY AT 6 PM. (Patient taking differently: Take 40 mg by mouth every evening.) 90 tablet 1  . B Complex-C-Zn-Folic Acid (DIALYVITE 502 WITH ZINC) 0.8 MG TABS Take 1 tablet by mouth at bedtime.    . Blood Glucose Monitoring Suppl (BLOOD GLUCOSE METER) kit Use as instructed 1 each 0  . Blood Glucose Monitoring Suppl (TRUE METRIX METER) DEVI 1 each by Does not apply route 3 (three) times daily before meals. 1 each 0  . calcium acetate (PHOSLO) 667 MG capsule Take 2,001 mg by mouth 3 (three) times daily with meals.    . calcium acetate (PHOSLO) 667 MG capsule TAKE 1 CAPSULE BY MOUTH 3 TIMES DAILY WITH MEALS AND 1 CAPSULE WITH SNACKS (2 SNACKS A DAY). 150 capsule 11  . calcium carbonate (OSCAL) 1500 (600 Ca) MG TABS tablet TAKE 1 TABLET (500 MG OF ELEMENTAL CALCIUM TOTAL) BY MOUTH TWO TIMES DAILY WITH A MEAL. 60 tablet 0  . carvedilol (COREG) 12.5 MG tablet TAKE 1 TABLET (12.5 MG TOTAL) BY MOUTH 2 (TWO) TIMES DAILY WITH A MEAL. 60 tablet 3  . glucose blood (TRUETEST TEST) test strip Use as instructed 100 each 12  . Lancets (FREESTYLE) lancets Use as instructed 100 each 12  . losartan (COZAAR) 50 MG tablet TAKE 1 TABLET (50 MG TOTAL) BY MOUTH DAILY. 30 tablet 0  . metoCLOPramide (REGLAN) 5 MG tablet TAKE 1 TABLET BY MOUTH FOUR TIMES DAILY, BEFORE MEALS AND AT BEDTIME. 120 tablet 1  . rOPINIRole (REQUIP) 2 MG tablet Take 2 mg by mouth in the morning and at bedtime.    Marland Kitchen rOPINIRole (REQUIP) 2 MG tablet TAKE 1 TABLET BY MOUTH EVERY MORNING BEFORE HD TREATMENT AND  TAKE 1 TABLET BY MOUTH EVERY NIGHT AT BEDTIME.STOP IF FELLING DROWSY 60 tablet 0  . TRUEplus Lancets 28G MISC 1 each by Does not apply route 3 (three) times daily before meals. 100 each 12  . rOPINIRole (REQUIP) 0.25 MG tablet TAKE 1 TABLET BY MOUTH AT BEDTIME AS NEEDED FOR RESTLESS LEGS (Patient not taking: Reported on 12/12/2020) 30 tablet 2  . rOPINIRole (REQUIP) 0.5 MG tablet TAKE 1 TABLET BY MOUTH 30 MINUTES PRIOR TO DIALYSIS AND ANOTHER TABLET MID RX IF NEEDED (Patient not taking: Reported on 12/12/2020) 45 tablet 3  . rOPINIRole (REQUIP) 0.5 MG tablet TAKE 1 TABLET BY MOUTH AT BEDTIME (Patient not taking: Reported on 12/12/2020) 30 tablet 5  . rOPINIRole (REQUIP) 1 MG tablet TAKE  1 TABLET BY MOUTH TWICE A DAY (Patient not taking: Reported on 12/12/2020) 60 tablet 3  . rOPINIRole (REQUIP) 2 MG tablet TAKE 1 TABLET BY MOUTH ONCE A DAY (Patient not taking: Reported on 12/12/2020) 90 tablet 3   Facility-Administered Medications Prior to Visit  Medication Dose Route Frequency Provider Last Rate Last Admin  . sodium chloride flush (NS) 0.9 % injection 3 mL  3 mL Intravenous Q12H Meng, [place], PA         ROS Review of Systems  Constitutional: Positive for fatigue. Negative for activity change and appetite change.  HENT: Negative for congestion, sinus pressure and sore throat.   Eyes: Negative for visual disturbance.  Respiratory: Negative for cough, chest tightness, shortness of breath and wheezing.   Cardiovascular: Negative for chest pain and palpitations.  Gastrointestinal: Negative for abdominal distention, abdominal pain and constipation.  Endocrine: Negative for polydipsia.  Genitourinary: Negative for dysuria and frequency.  Musculoskeletal: Negative for arthralgias and back pain.  Skin: Negative for rash.  Neurological: Negative for tremors, light-headedness and numbness.  Hematological: Does not bruise/bleed easily.  Psychiatric/Behavioral: Positive for sleep disturbance. Negative for  agitation and behavioral problems.       Dysphoric mood    Objective:  BP (!) 196/89 (BP Location: Right Arm)   Pulse 72   Ht 5' 2" (1.575 m)   Wt 175 lb 3.2 oz (79.5 kg)   LMP  (LMP Unknown)   SpO2 99%   BMI 32.04 kg/m   BP/Weight 12/12/2020 11/30/2020 2/47/9980  Systolic BP 012 393 594  Diastolic BP 89 70 76  Wt. (Lbs) 175.2 171 170  BMI 32.04 31.28 31.09      Physical Exam Constitutional:      Appearance: She is well-developed.  Neck:     Vascular: No JVD.  Cardiovascular:     Rate and Rhythm: Normal rate.     Heart sounds: Normal heart sounds. No murmur heard.   Pulmonary:     Effort: Pulmonary effort is normal.     Breath sounds: Normal breath sounds. No wheezing or rales.  Chest:     Chest wall: No tenderness.  Abdominal:     General: Bowel sounds are normal. There is no distension.     Palpations: Abdomen is soft. There is no mass.     Tenderness: There is no abdominal tenderness.  Musculoskeletal:        General: Normal range of motion.     Right lower leg: No edema.     Left lower leg: No edema.  Neurological:     Mental Status: She is alert and oriented to person, place, and time.  Psychiatric:     Comments: Dysphoric mood     CMP Latest Ref Rng & Units 11/24/2020 11/08/2020 11/07/2020  Glucose 65 - 99 mg/dL 131(H) 156(H) 158(H)  BUN 8 - 27 mg/dL 47(H) 33(H) 23  Creatinine 0.57 - 1.00 mg/dL 6.10(H) 4.64(H) 3.97(H)  Sodium 134 - 144 mmol/L 141 137 141  Potassium 3.5 - 5.2 mmol/L 4.5 3.6 3.4(L)  Chloride 96 - 106 mmol/L 96 99 98  CO2 20 - 29 mmol/L _0 Calcium 8.7 - 10.3 mg/dL 9.4 8.4(L) 8.5(L)  Total Protein 6.5 - 8.1 g/dL - - 5.9(L)  Total Bilirubin 0.3 - 1.2 mg/dL - - 0.5  Alkaline Phos 38 - 126 U/L - - 65  AST 15 - 41 U/L - - 15  ALT 0 - 44 U/L - - <5  Lipid Panel     Component Value Date/Time   CHOL 251 (H) 11/25/2018 1506   TRIG 272 (H) 11/25/2018 1506   HDL 44 11/25/2018 1506   CHOLHDL 5.7 (H) 11/25/2018 1506   CHOLHDL 4.8  11/13/2016 1018   VLDL 40 (H) 02/15/2016 0907   LDLCALC 153 (H) 11/25/2018 1506    CBC    Component Value Date/Time   WBC 9.0 11/24/2020 1304   WBC 8.7 11/09/2020 0351   RBC 2.91 (L) 11/24/2020 1304   RBC 2.56 (L) 11/09/2020 0351   HGB 8.4 (L) 11/24/2020 1304   HCT 25.8 (L) 11/24/2020 1304   PLT 376 11/24/2020 1304   MCV 89 11/24/2020 1304   MCH 28.9 11/24/2020 1304   MCH 28.5 11/09/2020 0351   MCHC 32.6 11/24/2020 1304   MCHC 31.3 11/09/2020 0351   RDW 17.0 (H) 11/24/2020 1304   LYMPHSABS 1.9 11/07/2020 1215   MONOABS 0.7 11/07/2020 1215   EOSABS 0.2 11/07/2020 1215   BASOSABS 0.0 11/07/2020 1215    Lab Results  Component Value Date   HGBA1C 6.4 (H) 11/08/2020    Assessment & Plan:  1. Pulmonary emphysema, unspecified emphysema type ([place]) She does have ongoing dyspnea which could be secondary to CHF exacerbation but I will provide her some albuterol nebulizer and ProAir which she said has been beneficial We have also given her nebulizer machine from the clinic - albuterol (VENTOLIN HFA) 108 (90 Base) MCG/ACT inhaler; Inhale 2 puffs into the lungs every 6 (six) hours as needed for wheezing or shortness of breath.  Dispense: 8.5 g; Refill: 1 - albuterol (PROVENTIL) (2.5 MG/3ML) 0.083% nebulizer solution; Take 3 mLs (2.5 mg total) by nebulization every 6 (six) hours as needed for wheezing or shortness of breath.  Dispense: 75 mL; Refill: 1  2. Hypertensive heart disease with acute systolic congestive heart failure (HCC) EF of 30% with ongoing symptoms She might benefit from Lasix but patient and daughter informs me that nephrologist has advised against using Lasix Advised to maintain head of bed inclined at 30 degrees Blood pressure is significantly elevated Holding off on adding additional antihypertensive given she becomes hypotensive during hemodialysis sessions  3. End stage renal disease on dialysis due to type 2 diabetes mellitus ([place]) High risk patient with  multiple comorbidities Currently on hemodialysis She is not tolerating dialysis well New onset anxiety associated with underlying chronic medical conditions in addition to new diagnosis CHF She will benefit from psychotherapy  4. Anxiety See #3 above Placed on SSRI and hydroxyzine will be used as needed If symptoms are not controlled will consider low-dose benzodiazepine - FLUoxetine (PROZAC) 20 MG capsule; Take 1 capsule (20 mg total) by mouth daily.  Dispense: 30 capsule; Refill: 3 - hydrOXYzine (ATARAX/VISTARIL) 25 MG tablet; Take 1 tablet (25 mg total) by mouth 3 (three) times daily as needed.  Dispense: 60 tablet; Refill: 1  5. Restless leg syndrome Uncontrolled on Requip Placed on gabapentin - gabapentin (NEURONTIN) 100 MG capsule; Take 1 capsule (100 mg total) by mouth at bedtime.  Dispense: 30 capsule; Refill: 3  6. Hyperlipidemia associated with type 2 diabetes mellitus ([place]) Controlled Low-cholesterol diet    Meds ordered this encounter  Medications  . albuterol (VENTOLIN HFA) 108 (90 Base) MCG/ACT inhaler    Sig: Inhale 2 puffs into the lungs every 6 (six) hours as needed for wheezing or shortness of breath.    Dispense:  8.5 g    Refill:  1  . gabapentin (NEURONTIN) 100  MG capsule    Sig: Take 1 capsule (100 mg total) by mouth at bedtime.    Dispense:  30 capsule    Refill:  3  . albuterol (PROVENTIL) (2.5 MG/3ML) 0.083% nebulizer solution    Sig: Take 3 mLs (2.5 mg total) by nebulization every 6 (six) hours as needed for wheezing or shortness of breath.    Dispense:  75 mL    Refill:  1  . FLUoxetine (PROZAC) 20 MG capsule    Sig: Take 1 capsule (20 mg total) by mouth daily.    Dispense:  30 capsule    Refill:  3  . hydrOXYzine (ATARAX/VISTARIL) 25 MG tablet    Sig: Take 1 tablet (25 mg total) by mouth 3 (three) times daily as needed.    Dispense:  60 tablet    Refill:  1    Follow-up: Return in about 6 weeks (around 01/23/2021) for Follow-up of anxiety.        Charlott Rakes, MD, FAAFP. [facility], [place]   12/12/2020, 1:42 PM

## 2020-12-13 ENCOUNTER — Inpatient Hospital Stay (HOSPITAL_COMMUNITY)
Admission: EM | Admit: 2020-12-13 | Discharge: 2020-12-16 | DRG: 640 | Disposition: A | Discharge status: Home / Self Care | Payer: Medicare Other | Attending: Internal Medicine | Admitting: Internal Medicine

## 2020-12-13 ENCOUNTER — Encounter (HOSPITAL_COMMUNITY): Payer: Self-pay

## 2020-12-13 ENCOUNTER — Other Ambulatory Visit: Payer: Self-pay

## 2020-12-13 ENCOUNTER — Emergency Department (HOSPITAL_COMMUNITY): Payer: Medicare Other

## 2020-12-13 DIAGNOSIS — N186 End stage renal disease: Secondary | ICD-10-CM | POA: Diagnosis not present

## 2020-12-13 DIAGNOSIS — Z9115 Patient's noncompliance with renal dialysis: Secondary | ICD-10-CM

## 2020-12-13 DIAGNOSIS — Z885 Allergy status to narcotic agent status: Secondary | ICD-10-CM

## 2020-12-13 DIAGNOSIS — I2510 Atherosclerotic heart disease of native coronary artery without angina pectoris: Secondary | ICD-10-CM | POA: Diagnosis present

## 2020-12-13 DIAGNOSIS — E669 Obesity, unspecified: Secondary | ICD-10-CM | POA: Diagnosis present

## 2020-12-13 DIAGNOSIS — R0600 Dyspnea, unspecified: Secondary | ICD-10-CM | POA: Diagnosis not present

## 2020-12-13 DIAGNOSIS — I69351 Hemiplegia and hemiparesis following cerebral infarction affecting right dominant side: Secondary | ICD-10-CM | POA: Diagnosis not present

## 2020-12-13 DIAGNOSIS — J810 Acute pulmonary edema: Secondary | ICD-10-CM | POA: Diagnosis present

## 2020-12-13 DIAGNOSIS — Z79899 Other long term (current) drug therapy: Secondary | ICD-10-CM

## 2020-12-13 DIAGNOSIS — K3184 Gastroparesis: Secondary | ICD-10-CM | POA: Diagnosis present

## 2020-12-13 DIAGNOSIS — R55 Syncope and collapse: Secondary | ICD-10-CM | POA: Diagnosis present

## 2020-12-13 DIAGNOSIS — I132 Hypertensive heart and chronic kidney disease with heart failure and with stage 5 chronic kidney disease, or end stage renal disease: Secondary | ICD-10-CM | POA: Diagnosis not present

## 2020-12-13 DIAGNOSIS — Z9981 Dependence on supplemental oxygen: Secondary | ICD-10-CM | POA: Diagnosis not present

## 2020-12-13 DIAGNOSIS — Z20822 Contact with and (suspected) exposure to covid-19: Secondary | ICD-10-CM | POA: Diagnosis present

## 2020-12-13 DIAGNOSIS — I10 Essential (primary) hypertension: Secondary | ICD-10-CM | POA: Diagnosis present

## 2020-12-13 DIAGNOSIS — J9601 Acute respiratory failure with hypoxia: Secondary | ICD-10-CM | POA: Diagnosis present

## 2020-12-13 DIAGNOSIS — J439 Emphysema, unspecified: Secondary | ICD-10-CM | POA: Diagnosis present

## 2020-12-13 DIAGNOSIS — I5020 Unspecified systolic (congestive) heart failure: Secondary | ICD-10-CM | POA: Diagnosis not present

## 2020-12-13 DIAGNOSIS — G2581 Restless legs syndrome: Secondary | ICD-10-CM | POA: Diagnosis present

## 2020-12-13 DIAGNOSIS — J8410 Pulmonary fibrosis, unspecified: Secondary | ICD-10-CM | POA: Diagnosis not present

## 2020-12-13 DIAGNOSIS — I5022 Chronic systolic (congestive) heart failure: Secondary | ICD-10-CM | POA: Diagnosis present

## 2020-12-13 DIAGNOSIS — Z7982 Long term (current) use of aspirin: Secondary | ICD-10-CM

## 2020-12-13 DIAGNOSIS — E1140 Type 2 diabetes mellitus with diabetic neuropathy, unspecified: Secondary | ICD-10-CM | POA: Diagnosis present

## 2020-12-13 DIAGNOSIS — I5023 Acute on chronic systolic (congestive) heart failure: Secondary | ICD-10-CM

## 2020-12-13 DIAGNOSIS — N289 Disorder of kidney and ureter, unspecified: Secondary | ICD-10-CM | POA: Diagnosis present

## 2020-12-13 DIAGNOSIS — E1122 Type 2 diabetes mellitus with diabetic chronic kidney disease: Secondary | ICD-10-CM | POA: Diagnosis present

## 2020-12-13 DIAGNOSIS — Z83438 Family history of other disorder of lipoprotein metabolism and other lipidemia: Secondary | ICD-10-CM

## 2020-12-13 DIAGNOSIS — Z823 Family history of stroke: Secondary | ICD-10-CM

## 2020-12-13 DIAGNOSIS — F419 Anxiety disorder, unspecified: Secondary | ICD-10-CM | POA: Diagnosis present

## 2020-12-13 DIAGNOSIS — Z6832 Body mass index (BMI) 32.0-32.9, adult: Secondary | ICD-10-CM

## 2020-12-13 DIAGNOSIS — N250 Renal osteodystrophy: Secondary | ICD-10-CM | POA: Diagnosis not present

## 2020-12-13 DIAGNOSIS — Z91048 Other nonmedicinal substance allergy status: Secondary | ICD-10-CM

## 2020-12-13 DIAGNOSIS — J449 Chronic obstructive pulmonary disease, unspecified: Secondary | ICD-10-CM | POA: Diagnosis not present

## 2020-12-13 DIAGNOSIS — J811 Chronic pulmonary edema: Secondary | ICD-10-CM

## 2020-12-13 DIAGNOSIS — E1143 Type 2 diabetes mellitus with diabetic autonomic (poly)neuropathy: Secondary | ICD-10-CM

## 2020-12-13 DIAGNOSIS — I517 Cardiomegaly: Secondary | ICD-10-CM | POA: Diagnosis not present

## 2020-12-13 DIAGNOSIS — E785 Hyperlipidemia, unspecified: Secondary | ICD-10-CM | POA: Diagnosis present

## 2020-12-13 DIAGNOSIS — F32A Depression, unspecified: Secondary | ICD-10-CM | POA: Diagnosis present

## 2020-12-13 DIAGNOSIS — Z992 Dependence on renal dialysis: Secondary | ICD-10-CM | POA: Diagnosis not present

## 2020-12-13 DIAGNOSIS — F17210 Nicotine dependence, cigarettes, uncomplicated: Secondary | ICD-10-CM | POA: Diagnosis present

## 2020-12-13 DIAGNOSIS — I509 Heart failure, unspecified: Secondary | ICD-10-CM

## 2020-12-13 DIAGNOSIS — N179 Acute kidney failure, unspecified: Secondary | ICD-10-CM

## 2020-12-13 DIAGNOSIS — R0602 Shortness of breath: Secondary | ICD-10-CM | POA: Diagnosis not present

## 2020-12-13 DIAGNOSIS — R079 Chest pain, unspecified: Secondary | ICD-10-CM

## 2020-12-13 DIAGNOSIS — E1129 Type 2 diabetes mellitus with other diabetic kidney complication: Secondary | ICD-10-CM | POA: Diagnosis not present

## 2020-12-13 DIAGNOSIS — E8770 Fluid overload, unspecified: Principal | ICD-10-CM

## 2020-12-13 DIAGNOSIS — D631 Anemia in chronic kidney disease: Secondary | ICD-10-CM | POA: Diagnosis not present

## 2020-12-13 DIAGNOSIS — Z8249 Family history of ischemic heart disease and other diseases of the circulatory system: Secondary | ICD-10-CM

## 2020-12-13 DIAGNOSIS — I700 Atherosclerosis of aorta: Secondary | ICD-10-CM | POA: Diagnosis not present

## 2020-12-13 DIAGNOSIS — Z8673 Personal history of transient ischemic attack (TIA), and cerebral infarction without residual deficits: Secondary | ICD-10-CM

## 2020-12-13 DIAGNOSIS — I120 Hypertensive chronic kidney disease with stage 5 chronic kidney disease or end stage renal disease: Secondary | ICD-10-CM | POA: Diagnosis not present

## 2020-12-13 DIAGNOSIS — N2581 Secondary hyperparathyroidism of renal origin: Secondary | ICD-10-CM | POA: Diagnosis present

## 2020-12-13 LAB — CBC WITH DIFFERENTIAL/PLATELET
Abs Immature Granulocytes: 0.1 10*3/uL — ABNORMAL HIGH (ref 0.00–0.07)
Basophils Absolute: 0 10*3/uL (ref 0.0–0.1)
Basophils Relative: 0 %
Eosinophils Absolute: 0.2 10*3/uL (ref 0.0–0.5)
Eosinophils Relative: 2 %
HCT: 25.1 % — ABNORMAL LOW (ref 36.0–46.0)
Hemoglobin: 7.8 g/dL — ABNORMAL LOW (ref 12.0–15.0)
Immature Granulocytes: 1 %
Lymphocytes Relative: 13 %
Lymphs Abs: 1.4 10*3/uL (ref 0.7–4.0)
MCH: 29.9 pg (ref 26.0–34.0)
MCHC: 31.1 g/dL (ref 30.0–36.0)
MCV: 96.2 fL (ref 80.0–100.0)
Monocytes Absolute: 0.7 10*3/uL (ref 0.1–1.0)
Monocytes Relative: 6 %
Neutro Abs: 8.9 10*3/uL — ABNORMAL HIGH (ref 1.7–7.7)
Neutrophils Relative %: 78 %
Platelets: 299 10*3/uL (ref 150–400)
RBC: 2.61 MIL/uL — ABNORMAL LOW (ref 3.87–5.11)
RDW: 17.5 % — ABNORMAL HIGH (ref 11.5–15.5)
WBC: 11.3 10*3/uL — ABNORMAL HIGH (ref 4.0–10.5)
nRBC: 0 % (ref 0.0–0.2)

## 2020-12-13 LAB — COMPREHENSIVE METABOLIC PANEL
ALT: 9 U/L (ref 0–44)
AST: 10 U/L — ABNORMAL LOW (ref 15–41)
Albumin: 2.9 g/dL — ABNORMAL LOW (ref 3.5–5.0)
Alkaline Phosphatase: 72 U/L (ref 38–126)
Anion gap: 17 — ABNORMAL HIGH (ref 5–15)
BUN: 84 mg/dL — ABNORMAL HIGH (ref 8–23)
CO2: 21 mmol/L — ABNORMAL LOW (ref 22–32)
Calcium: 7.9 mg/dL — ABNORMAL LOW (ref 8.9–10.3)
Chloride: 102 mmol/L (ref 98–111)
Creatinine, Ser: 8.45 mg/dL — ABNORMAL HIGH (ref 0.44–1.00)
GFR, Estimated: 5 mL/min — ABNORMAL LOW (ref >60)
Glucose, Bld: 152 mg/dL — ABNORMAL HIGH (ref 70–99)
Potassium: 4.4 mmol/L (ref 3.5–5.1)
Sodium: 140 mmol/L (ref 135–145)
Total Bilirubin: 0.3 mg/dL (ref 0.3–1.2)
Total Protein: 6.1 g/dL — ABNORMAL LOW (ref 6.5–8.1)

## 2020-12-13 LAB — TROPONIN I (HIGH SENSITIVITY)
Troponin I (High Sensitivity): 24 ng/L — ABNORMAL HIGH (ref <18)
Troponin I (High Sensitivity): 27 ng/L — ABNORMAL HIGH (ref <18)

## 2020-12-13 LAB — RESP PANEL BY RT-PCR (FLU A&B, COVID) ARPGX2
Influenza A by PCR: NEGATIVE
Influenza B by PCR: NEGATIVE
SARS Coronavirus 2 by RT PCR: NEGATIVE

## 2020-12-13 LAB — BRAIN NATRIURETIC PEPTIDE: B Natriuretic Peptide: 1380.6 pg/mL — ABNORMAL HIGH (ref 0.0–100.0)

## 2020-12-13 MED ORDER — CALCIUM ACETATE (PHOS BINDER) 667 MG PO CAPS
667.0000 mg | ORAL_CAPSULE | ORAL | Status: DC
  Administered 2020-12-15: 667 mg via ORAL
  Filled 2020-12-13: qty 1

## 2020-12-13 MED ORDER — LIDOCAINE-PRILOCAINE 2.5-2.5 % EX CREA: 1.0000 "application " | TOPICAL_CREAM | CUTANEOUS | Status: DC | PRN

## 2020-12-13 MED ORDER — PENTAFLUOROPROP-TETRAFLUOROETH EX AERO: 1.0000 "application " | INHALATION_SPRAY | CUTANEOUS | Status: DC | PRN

## 2020-12-13 MED ORDER — CHLORHEXIDINE GLUCONATE CLOTH 2 % EX PADS
6.0000 | MEDICATED_PAD | Freq: Every day | CUTANEOUS | Status: DC
  Administered 2020-12-13 – 2020-12-16 (×4): 6 via TOPICAL

## 2020-12-13 MED ORDER — SODIUM CHLORIDE 0.9 % IV SOLN: 100.0000 mL | INTRAVENOUS | Status: DC | PRN

## 2020-12-13 MED ORDER — LIDOCAINE-PRILOCAINE 2.5-2.5 % EX CREA: 1.0000 "application " | TOPICAL_CREAM | CUTANEOUS | Status: DC

## 2020-12-13 MED ORDER — ATORVASTATIN CALCIUM 40 MG PO TABS
40.0000 mg | ORAL_TABLET | Freq: Every evening | ORAL | Status: DC
  Administered 2020-12-14 – 2020-12-15 (×3): 40 mg via ORAL
  Filled 2020-12-13 (×3): qty 1

## 2020-12-13 MED ORDER — CALCIUM CARBONATE 1250 (500 CA) MG PO TABS
1250.0000 mg | ORAL_TABLET | Freq: Two times a day (BID) | ORAL | Status: DC
  Administered 2020-12-14 – 2020-12-16 (×5): 1250 mg via ORAL
  Filled 2020-12-13 (×6): qty 1

## 2020-12-13 MED ORDER — HEPARIN SODIUM (PORCINE) 1000 UNIT/ML DIALYSIS: 1000.0000 [IU] | INTRAMUSCULAR | Status: DC | PRN

## 2020-12-13 MED ORDER — ASPIRIN EC 325 MG PO TBEC
325.0000 mg | DELAYED_RELEASE_TABLET | Freq: Every day | ORAL | Status: DC
  Administered 2020-12-14 – 2020-12-16 (×4): 325 mg via ORAL
  Filled 2020-12-13 (×4): qty 1

## 2020-12-13 MED ORDER — FLUOXETINE HCL 20 MG PO CAPS: 20.0000 mg | ORAL_CAPSULE | Freq: Every day | ORAL | Status: DC

## 2020-12-13 MED ORDER — HEPARIN SODIUM (PORCINE) 1000 UNIT/ML DIALYSIS: 20.0000 [IU]/kg | INTRAMUSCULAR | Status: DC | PRN

## 2020-12-13 MED ORDER — ACETAMINOPHEN 325 MG PO TABS
650.0000 mg | ORAL_TABLET | Freq: Four times a day (QID) | ORAL | Status: DC | PRN
  Filled 2020-12-13: qty 2

## 2020-12-13 MED ORDER — CARVEDILOL 12.5 MG PO TABS
12.5000 mg | ORAL_TABLET | Freq: Two times a day (BID) | ORAL | Status: DC
  Administered 2020-12-14 – 2020-12-16 (×4): 12.5 mg via ORAL
  Filled 2020-12-13 (×5): qty 1

## 2020-12-13 MED ORDER — INSULIN ASPART 100 UNIT/ML ~~LOC~~ SOLN
0.0000 [IU] | Freq: Three times a day (TID) | SUBCUTANEOUS | Status: DC
  Administered 2020-12-14: 1 [IU] via SUBCUTANEOUS

## 2020-12-13 MED ORDER — CALCIUM ACETATE (PHOS BINDER) 667 MG PO CAPS
1334.0000 mg | ORAL_CAPSULE | Freq: Three times a day (TID) | ORAL | Status: DC
  Administered 2020-12-14 – 2020-12-16 (×7): 1334 mg via ORAL
  Filled 2020-12-13 (×8): qty 2

## 2020-12-13 MED ORDER — ALBUTEROL SULFATE (2.5 MG/3ML) 0.083% IN NEBU
2.5000 mg | INHALATION_SOLUTION | Freq: Four times a day (QID) | RESPIRATORY_TRACT | Status: DC | PRN
  Administered 2020-12-13: 2.5 mg via RESPIRATORY_TRACT
  Filled 2020-12-13: qty 3

## 2020-12-13 MED ORDER — METOCLOPRAMIDE HCL 5 MG PO TABS: 5.0000 mg | ORAL_TABLET | Freq: Every day | ORAL | Status: DC | PRN

## 2020-12-13 MED ORDER — ACETAMINOPHEN 650 MG RE SUPP: 650.0000 mg | Freq: Four times a day (QID) | RECTAL | Status: DC | PRN

## 2020-12-13 MED ORDER — DOXERCALCIFEROL 4 MCG/2ML IV SOLN
2.0000 ug | INTRAVENOUS | Status: DC
  Filled 2020-12-13: qty 2

## 2020-12-13 MED ORDER — HYDROXYZINE HCL 25 MG PO TABS: 25.0000 mg | ORAL_TABLET | Freq: Three times a day (TID) | ORAL | Status: DC | PRN

## 2020-12-13 MED ORDER — HEPARIN SODIUM (PORCINE) 5000 UNIT/ML IJ SOLN
5000.0000 [IU] | Freq: Three times a day (TID) | INTRAMUSCULAR | Status: DC
  Administered 2020-12-13 – 2020-12-16 (×7): 5000 [IU] via SUBCUTANEOUS
  Filled 2020-12-13 (×8): qty 1

## 2020-12-13 MED ORDER — PROSOURCE PLUS PO LIQD
30.0000 mL | Freq: Two times a day (BID) | ORAL | Status: DC
  Administered 2020-12-15: 30 mL via ORAL
  Filled 2020-12-13 (×4): qty 30

## 2020-12-13 MED ORDER — LOSARTAN POTASSIUM 50 MG PO TABS
50.0000 mg | ORAL_TABLET | Freq: Every day | ORAL | Status: DC
  Administered 2020-12-14 – 2020-12-16 (×4): 50 mg via ORAL
  Filled 2020-12-13 (×4): qty 1

## 2020-12-13 MED ORDER — LIDOCAINE HCL (PF) 1 % IJ SOLN: 5.0000 mL | INTRAMUSCULAR | Status: DC | PRN

## 2020-12-13 NOTE — Progress Notes (Signed)
NEW ADMISSION NOTE New Admission Note:   Arrival Method: Stretcher Mental Orientation: A&O x4 Telemetry: 44M-09   Assessment: Completed Skin: Intact  IV: Right hand Pain: 0/10 Tubes: none Safety Measures: Safety Fall Prevention Plan has been given, discussed and signed Admission: Completed [facility] Orientation: Patient has been orientated to the room, unit and staff.  Family: Daughter at bedside  Orders have been reviewed and implemented. Will continue to monitor the patient. Call light has been placed within reach and bed alarm has been activated.   Mikki Santee, RN

## 2020-12-13 NOTE — ED Provider Notes (Signed)
MSE was initiated and I personally evaluated the patient and placed orders (if any) at  2:38 AM on December 13, 2020.  C/o severe SOB when she lies down at night. She checked her pulse ox at home and reports readings of 71%, then 66%. Recent heart cath, ECHO. H/o CHF, dialysis, COPD in a continuous smoker, CVA, DM, HTN. No fever.   Patient appears significantly SOB, speaking is difficult. O2 placed which improved her subjective symptoms and O2 sat 91%. Bibasilar rales present.   Today's Vitals   12/13/20 0225 12/13/20 0226  BP:  (!) 184/69  Pulse:  81  Resp:  16  Temp:  98.1 F (36.7 C)  TempSrc:  Oral  SpO2:  96%  Weight: 79.5 kg   Height: '5\' 2"'$  (1.575 m)   PainSc: 0-No pain    Body mass index is 32.06 kg/m.  The patient appears stable so that the remainder of the MSE may be completed by another provider.   Charlann Lange, PA-C 12/13/20 0241    Veryl Speak, MD 12/13/20 [phone]

## 2020-12-13 NOTE — ED Notes (Signed)
Pt goes to dialysis MWF - pt wasn't able to get session done because she was sick.

## 2020-12-13 NOTE — Consult Note (Signed)
[facility] Renal Consultation Note    Indication for Consultation:  Management of ESRD/hemodialysis, anemia, hypertension/volume, and secondary hyperparathyroidism. PCP:  HPI: Tina Gomez is a 65 y.o. female with ESRD (started HD 07/2020), COPD, T2DM with significant gastroparesis, HL, Hx CVA, HFrEF (EF 30%) who is being admitted with dyspnea/hypoxia.  Pt is resting while sitting on edge of bed. Daughter at bedside who provides most of the history. Has been struggling with dyspnea and orthopnea for the past few weeks. Saw her PCP who walked her for O2 qualification, apparently did not meet requirements. She has mentioned symptoms to her nephrology/HD team -- apparently has been hard to find good dry weight for her because she has bounced between overload and volume depletion, as well as cutting her HD time d/t significant RLS symptoms and anxiety. She has also recently completed cardiac evaluation.   - [facility] admit 3/1-11/09/20 with CHF exacerbation, + pulm edema + R effusion   - ED visit for syncope after HD on 11/27/20, but CXR with B effusions/vascular congestion. 1L NS given + KCl 51mq given. - LHC 11/28/20 with non-obstructive CAD, but EF 30% - felt out of proportion for degree of CAD. - PCP appt 4/5 - started on several meds (SSRI, gabapentin, albuterol)  She had severe N/V/D earlier in the week and missed her HD on Monday. She endorses fatigue, nausea/vomiting (her baseline), dyspnea. No abdominal pain or CP at the moment.  Labs today show Na 140, K 4.4, CO2 21, Ca 7.9, WBC 11.3, Hgb 7.8, BNP 1380.6, Trop 24-> 27. COVID/Flu negative. CXR shows pulm edema on top of chronic emphysema with fibrosis.  [facility]08/20 as above.   Past Medical History:  Diagnosis Date  . Anemia   . Chronic kidney disease   . [place]   no inhaler  . [place]    no meds  - diet controlled  . History of blood transfusion 09/2020   1 unit  . HLD (hyperlipidemia)   . Hypertension   . PONV (postoperative nausea and vomiting)   . Restless legs syndrome (RLS)   . Stroke ([facility]    Past Surgical History:  Procedure Laterality Date  . AV FISTULA PLACEMENT Left 07/27/2020   Procedure: LEFT BRACHIO-BASILIC ARTERIOVENOUS (AV) FISTULA CREATION;  Surgeon: HCherre Robins MD;  [place]  Service: Vascular;  Laterality: Left;  . BASCILIC VEIN TRANSPOSITION Left 11/02/2020   Procedure: LEFT UPPER ARM SECOND STAGE BASILIC VEIN TRANSPOSITION;  Surgeon: HCherre Robins MD;  [place]  Service: Vascular;  Laterality: Left;  PERIPHERAL NERVE BLOCK  . CESAREAN SECTION     x 1  . ESOPHAGOGASTRODUODENOSCOPY (EGD) WITH PROPOFOL N/A 05/26/2020   Procedure: ESOPHAGOGASTRODUODENOSCOPY (EGD) WITH PROPOFOL;  Surgeon: PIrene Shipper MD;  [facility]  Service: Endoscopy;  Laterality: N/A;  . INSERTION OF DIALYSIS CATHETER Right 07/27/2020   Procedure: INSERTION OF RIGHT INTERNAL JUGULAR TUNNELED DIALYSIS CATHETER;  Surgeon: HCherre Robins MD;  [place]  Service: Vascular;  Laterality: Right;  . IR FLUORO GUIDE CV LINE RIGHT  07/25/2020  . IR [place] VASC ACCESS RIGHT  07/25/2020  . LEFT HEART CATH AND CORONARY ANGIOGRAPHY N/A 11/28/2020   Procedure: LEFT HEART CATH AND CORONARY ANGIOGRAPHY;  Surgeon: KTroy Sine MD;  [facility] [place];  Service: Cardiovascular;  Laterality: N/A;  . TONSILLECTOMY    .  UPPER GI ENDOSCOPY     growth removed from voice box  . WRIST SURGERY Left    ganglion cyst removal   Family History  Problem Relation Age of Onset  . Stroke Mother   . Hypertension Mother   . Hyperlipidemia Mother   . Hypertension Father   . Hyperlipidemia Father    Social History:  reports that she has been smoking cigarettes. She has a 2.50 pack-year smoking history. She has never used smokeless tobacco. She reports that  she does not drink alcohol and does not use drugs.  ROS: As per HPI otherwise negative.  Physical Exam: Vitals:   12/13/20 0700 12/13/20 0745 12/13/20 0900 12/13/20 1000  BP: (!) 165/57 (!) 166/80 (!) 182/67 (!) 170/63  Pulse: 72 72 69 71  Resp: (!) 21 19 (!) 22 (!) 24  Temp:      TempSrc:      SpO2: 97% 96% 97% 97%  Weight:      Height:         General: Chronically ill appearing man, appears older than stated age. Nasal O2 in place. Head: Normocephalic, atraumatic, sclera non-icteric, mucus membranes are moist. Neck: Supple without lymphadenopathy/masses. JVD not elevated. Lungs: Expiratory rhonchi throughout, no distinct rales Heart: RRR with normal S1, S2. No murmurs, rubs, or gallops appreciated. Abdomen: Soft, non-tender, non-distended with normoactive bowel sounds.  Musculoskeletal:  Strength and tone appear normal for age. Lower extremities: No edema or ischemic changes, no open wounds. Neuro: Awake but drowsy. Moves all extremities spontaneously. Dialysis Access: L AVF + bruit  Allergies  Allergen Reactions  . Codeine Nausea And Vomiting  . Adhesive [Tape] Other (See Comments)    Tape breaks out the skin if it is left on for a lengthy period of time   Prior to Admission medications   Medication Sig Start Date End Date Taking? Authorizing Provider  albuterol (PROVENTIL) (2.5 MG/3ML) 0.083% nebulizer solution Take 3 mLs (2.5 mg total) by nebulization every 6 (six) hours as needed for wheezing or shortness of breath. 12/12/20   Charlott Rakes, MD  albuterol (VENTOLIN HFA) 108 (90 Base) MCG/ACT inhaler Inhale 2 puffs into the lungs every 6 (six) hours as needed for wheezing or shortness of breath. 12/12/20   Charlott Rakes, MD  aspirin 81 MG chewable tablet Chew 1 tablet (81 mg total) by mouth daily. Patient taking differently: Chew 325 mg by mouth daily. 11/09/20   Lilland, Alana, DO  atorvastatin (LIPITOR) 40 MG tablet TAKE 1 TABLET BY MOUTH DAILY AT 6 PM. Patient taking  differently: Take 40 mg by mouth every evening. 05/23/20   Charlott Rakes, MD  B Complex-C-Zn-Folic Acid (DIALYVITE 852 WITH ZINC) 0.8 MG TABS Take 1 tablet by mouth at bedtime. 08/12/20   [provider]  Blood Glucose Monitoring Suppl (BLOOD GLUCOSE METER) kit Use as instructed 11/01/13   Reyne Dumas, MD  Blood Glucose Monitoring Suppl (TRUE METRIX METER) DEVI 1 each by Does not apply route 3 (three) times daily before meals. 06/02/20   Charlott Rakes, MD  calcium acetate (PHOSLO) 667 MG capsule Take 2,001 mg by mouth 3 (three) times daily with meals. 09/19/20   [provider]  calcium acetate (PHOSLO) 667 MG capsule TAKE 1 CAPSULE BY MOUTH 3 TIMES DAILY WITH MEALS AND 1 CAPSULE WITH SNACKS (2 SNACKS A DAY). 09/07/20 09/07/21  Corliss Parish, MD  calcium carbonate (OSCAL) 1500 (600 Ca) MG TABS tablet TAKE 1 TABLET (500 MG OF ELEMENTAL CALCIUM TOTAL) BY MOUTH TWO TIMES  DAILY WITH A MEAL. 07/31/20 07/31/21  Terrilee Croak, MD  carvedilol (COREG) 12.5 MG tablet TAKE 1 TABLET (12.5 MG TOTAL) BY MOUTH 2 (TWO) TIMES DAILY WITH A MEAL. 06/15/20 06/15/21  Charlott Rakes, MD  FLUoxetine (PROZAC) 20 MG capsule Take 1 capsule (20 mg total) by mouth daily. 12/12/20   Charlott Rakes, MD  gabapentin (NEURONTIN) 100 MG capsule Take 1 capsule (100 mg total) by mouth at bedtime. 12/12/20   Charlott Rakes, MD  glucose blood (TRUETEST TEST) test strip Use as instructed 06/02/20   Charlott Rakes, MD  hydrOXYzine (ATARAX/VISTARIL) 25 MG tablet Take 1 tablet (25 mg total) by mouth 3 (three) times daily as needed. 12/12/20   Charlott Rakes, MD  Lancets (FREESTYLE) lancets Use as instructed 11/01/13   Reyne Dumas, MD  lidocaine-prilocaine (EMLA) cream Apply 1 application topically See admin instructions. APPLY SMALL AMOUNT TO ACCESS SITE (AVF) 1 TO 2 HOURS BEFORE DIALYSIS. COVER WITH OCCLUSIVE DRESSING (SARAN WRAP). 12/04/20   [provider]  losartan (COZAAR) 50 MG tablet TAKE 1 TABLET (50 MG  TOTAL) BY MOUTH DAILY. 11/09/20 11/09/21  Lilland, Alana, DO  metoCLOPramide (REGLAN) 5 MG tablet TAKE 1 TABLET BY MOUTH FOUR TIMES DAILY, BEFORE MEALS AND AT BEDTIME. 11/13/20 11/13/21  Willia Craze, NP  rOPINIRole (REQUIP) 0.25 MG tablet TAKE 1 TABLET BY MOUTH AT BEDTIME AS NEEDED FOR RESTLESS LEGS Patient not taking: Reported on 12/12/2020 10/02/20 10/02/21  Corliss Parish, MD  rOPINIRole (REQUIP) 0.5 MG tablet TAKE 1 TABLET BY MOUTH 30 MINUTES PRIOR TO DIALYSIS AND ANOTHER TABLET MID RX IF NEEDED Patient not taking: Reported on 12/12/2020 11/15/20 11/15/21  Corliss Parish, MD  rOPINIRole (REQUIP) 0.5 MG tablet TAKE 1 TABLET BY MOUTH AT BEDTIME Patient not taking: Reported on 12/12/2020 10/13/20 10/13/21  Corliss Parish, MD  rOPINIRole (REQUIP) 1 MG tablet TAKE 1 TABLET BY MOUTH TWICE A DAY Patient not taking: Reported on 12/12/2020 12/06/20 12/06/21  Corliss Parish, MD  rOPINIRole (REQUIP) 2 MG tablet Take 2 mg by mouth in the morning and at bedtime.    [provider]  rOPINIRole (REQUIP) 2 MG tablet TAKE 1 TABLET BY MOUTH EVERY MORNING BEFORE HD TREATMENT AND TAKE 1 TABLET BY MOUTH EVERY NIGHT AT BEDTIME.STOP IF FELLING DROWSY 11/22/20 11/22/21  Adelfa Koh, NP  rOPINIRole (REQUIP) 2 MG tablet TAKE 1 TABLET BY MOUTH ONCE A DAY Patient not taking: Reported on 12/12/2020 10/25/20 10/25/21  Corliss Parish, MD  TRUEplus Lancets 28G MISC 1 each by Does not apply route 3 (three) times daily before meals. 06/02/20   Charlott Rakes, MD  amLODipine (NORVASC) 10 MG tablet Take 1 tablet (10 mg total) by mouth daily. 08/01/20 11/09/20  Terrilee Croak, MD  hydrALAZINE (APRESOLINE) 25 MG tablet Take 1 tablet (25 mg total) by mouth every 8 (eight) hours. 07/31/20 11/09/20  Terrilee Croak, MD  omeprazole (PRILOSEC) 20 MG capsule TAKE 1 CAPSULE (20 MG TOTAL) BY MOUTH 2 (TWO) TIMES DAILY BEFORE A MEAL. 08/01/20 08/22/20  Charlott Rakes, MD   Current Facility-Administered Medications  Medication  Dose Route Frequency Provider Last Rate Last Admin  . acetaminophen (TYLENOL) tablet 650 mg  650 mg Oral Q6H PRN Fuller Plan A, MD       Or  . acetaminophen (TYLENOL) suppository 650 mg  650 mg Rectal Q6H PRN Smith, Rondell A, MD      . albuterol (PROVENTIL) (2.5 MG/3ML) 0.083% nebulizer solution 2.5 mg  2.5 mg Nebulization Q6H PRN Norval Morton, MD      .  Chlorhexidine Gluconate Cloth 2 % PADS 6 each  6 each Topical Q0600 Loren Racer, PA-C      . heparin injection 5,000 Units  5,000 Units Subcutaneous Q8H Smith, Rondell A, MD      . sodium chloride flush (NS) 0.9 % injection 3 mL  3 mL Intravenous Q12H Almyra Deforest, [place]       Current Outpatient Medications  Medication Sig Dispense Refill  . albuterol (PROVENTIL) (2.5 MG/3ML) 0.083% nebulizer solution Take 3 mLs (2.5 mg total) by nebulization every 6 (six) hours as needed for wheezing or shortness of breath. 75 mL 1  . albuterol (VENTOLIN HFA) 108 (90 Base) MCG/ACT inhaler Inhale 2 puffs into the lungs every 6 (six) hours as needed for wheezing or shortness of breath. 8.5 g 1  . aspirin 81 MG chewable tablet Chew 1 tablet (81 mg total) by mouth daily. (Patient taking differently: Chew 325 mg by mouth daily.) 30 tablet 0  . atorvastatin (LIPITOR) 40 MG tablet TAKE 1 TABLET BY MOUTH DAILY AT 6 PM. (Patient taking differently: Take 40 mg by mouth every evening.) 90 tablet 1  . B Complex-C-Zn-Folic Acid (DIALYVITE 480 WITH ZINC) 0.8 MG TABS Take 1 tablet by mouth at bedtime.    . Blood Glucose Monitoring Suppl (BLOOD GLUCOSE METER) kit Use as instructed 1 each 0  . Blood Glucose Monitoring Suppl (TRUE METRIX METER) DEVI 1 each by Does not apply route 3 (three) times daily before meals. 1 each 0  . calcium acetate (PHOSLO) 667 MG capsule Take 2,001 mg by mouth 3 (three) times daily with meals.    . calcium acetate (PHOSLO) 667 MG capsule TAKE 1 CAPSULE BY MOUTH 3 TIMES DAILY WITH MEALS AND 1 CAPSULE WITH SNACKS (2 SNACKS A DAY). 150 capsule 11   . calcium carbonate (OSCAL) 1500 (600 Ca) MG TABS tablet TAKE 1 TABLET (500 MG OF ELEMENTAL CALCIUM TOTAL) BY MOUTH TWO TIMES DAILY WITH A MEAL. 60 tablet 0  . carvedilol (COREG) 12.5 MG tablet TAKE 1 TABLET (12.5 MG TOTAL) BY MOUTH 2 (TWO) TIMES DAILY WITH A MEAL. 60 tablet 3  . FLUoxetine (PROZAC) 20 MG capsule Take 1 capsule (20 mg total) by mouth daily. 30 capsule 3  . gabapentin (NEURONTIN) 100 MG capsule Take 1 capsule (100 mg total) by mouth at bedtime. 30 capsule 3  . glucose blood (TRUETEST TEST) test strip Use as instructed 100 each 12  . hydrOXYzine (ATARAX/VISTARIL) 25 MG tablet Take 1 tablet (25 mg total) by mouth 3 (three) times daily as needed. 60 tablet 1  . Lancets (FREESTYLE) lancets Use as instructed 100 each 12  . lidocaine-prilocaine (EMLA) cream Apply 1 application topically See admin instructions. APPLY SMALL AMOUNT TO ACCESS SITE (AVF) 1 TO 2 HOURS BEFORE DIALYSIS. COVER WITH OCCLUSIVE DRESSING (SARAN WRAP).    Marland Kitchen losartan (COZAAR) 50 MG tablet TAKE 1 TABLET (50 MG TOTAL) BY MOUTH DAILY. 30 tablet 0  . metoCLOPramide (REGLAN) 5 MG tablet TAKE 1 TABLET BY MOUTH FOUR TIMES DAILY, BEFORE MEALS AND AT BEDTIME. 120 tablet 1  . rOPINIRole (REQUIP) 0.25 MG tablet TAKE 1 TABLET BY MOUTH AT BEDTIME AS NEEDED FOR RESTLESS LEGS (Patient not taking: Reported on 12/12/2020) 30 tablet 2  . rOPINIRole (REQUIP) 0.5 MG tablet TAKE 1 TABLET BY MOUTH 30 MINUTES PRIOR TO DIALYSIS AND ANOTHER TABLET MID RX IF NEEDED (Patient not taking: Reported on 12/12/2020) 45 tablet 3  . rOPINIRole (REQUIP) 0.5 MG tablet TAKE 1 TABLET BY MOUTH  AT BEDTIME (Patient not taking: Reported on 12/12/2020) 30 tablet 5  . rOPINIRole (REQUIP) 1 MG tablet TAKE 1 TABLET BY MOUTH TWICE A DAY (Patient not taking: Reported on 12/12/2020) 60 tablet 3  . rOPINIRole (REQUIP) 2 MG tablet Take 2 mg by mouth in the morning and at bedtime.    Marland Kitchen rOPINIRole (REQUIP) 2 MG tablet TAKE 1 TABLET BY MOUTH EVERY MORNING BEFORE HD TREATMENT AND  TAKE 1 TABLET BY MOUTH EVERY NIGHT AT BEDTIME.STOP IF FELLING DROWSY 60 tablet 0  . rOPINIRole (REQUIP) 2 MG tablet TAKE 1 TABLET BY MOUTH ONCE A DAY (Patient not taking: Reported on 12/12/2020) 90 tablet 3  . TRUEplus Lancets 28G MISC 1 each by Does not apply route 3 (three) times daily before meals. 100 each 12   Labs: Basic Metabolic Panel: Recent Labs  Lab 12/13/20 0243  NA 140  K 4.4  CL 102  CO2 21*  GLUCOSE 152*  BUN 84*  CREATININE 8.45*  CALCIUM 7.9*   Liver Function Tests: Recent Labs  Lab 12/13/20 0243  AST 10*  ALT 9  ALKPHOS 72  BILITOT 0.3  PROT 6.1*  ALBUMIN 2.9*   CBC: Recent Labs  Lab 12/13/20 0243  WBC 11.3*  NEUTROABS 8.9*  HGB 7.8*  HCT 25.1*  MCV 96.2  PLT 299   Studies/Results: DG Chest 1 View  Result Date: 12/13/2020 CLINICAL DATA:  65 year old female with shortness of breath.  COPD. EXAM: CHEST  1 VIEW COMPARISON:  Portable chest 11/07/2020 and earlier. FINDINGS: Portable AP semi upright view at 0352 hours. Stable lung volumes. Stable cardiac size and mediastinal contours. Chronic mild cardiomegaly. Visualized tracheal air column is within normal limits. Stable right chest dual lumen dialysis type catheter. Chronic increased interstitial markings throughout the lungs, with early peripheral honeycombing demonstrated on a 2010 chest CT. Chronic upper lobe emphysema. No superimposed pneumothorax, pleural effusion. But interstitial markings remain increased compared to November, similar to last month. No consolidation. Stable visualized osseous structures. IMPRESSION: Acute on chronic interstitial opacity similar to last month and suspected due to pulmonary edema superimposed on chronic emphysema and developing pulmonary fibrosis. Electronically Signed   By: Genevie Ann M.D.   On: 12/13/2020 03:59   Dialysis Orders:  MWF at [facility] ---> cuts HD time q HD, gets at most 2:45hr treatments 3:45hr, 400/500, EDW 75kg, 3K/2.5Ca, UFP #4, AVF, heparin 2000 bolus -  Hectoral 55mg IV q HD - Venofer 1084mx 10 ordered - s/p 4 of 10 so far - Mircera 2256mIV q 2 weeks (last 3/28)  Assessment/Plan: 1. Dyspnea: Multifactorial - she does have pulmomary edema, but also with quite significant COPD and HFrEF and chronic anemia. For HD today to see if can get volume down.  2.  ESRD: Usual MWF schedule -- apparently has been having a rough time with HD. Cannot tolerating sitting for full treatment d/t RLS/anxiety - just started on gabapentin and SSRI in addition to Requip which may help. So far, does not appear dialysis is adding to her QOL. Will attempt to fix the fixable and then reassess. HD today, per usual schedule. 3.  Hypertension/volume: BP very high today, UF with HD as tolerated. 4.  Anemia: Hgb 7.8 - on max ESA as outpatient, not due for next dose yet. Consider transfusion. 5.  Metabolic bone disease: CorrCa ok - continue home meds. 6.  Nutrition: Alb low, will add supplements. 7.  HFrEF (EF 30%): Recent LHC with non-obstuctive CAD  8.  COPD: On  albuterol only - likely needs LABA 9.  T2DM/gatroparesis  Veneta Penton, PA-C 12/13/2020, 11:01 AM  [employer]

## 2020-12-13 NOTE — Progress Notes (Signed)
Attempted to use L AVF and cannulated well with good flow and return. Pt asked to turn to her L side for comfort and Venous site got infiltrated. Use the R chest HD cath and applied cold compress on L AVF site.

## 2020-12-13 NOTE — H&P (Signed)
History and Physical    Tina Gomez ERX:[id number] DOB: [birth year] DOA: 12/13/2020  Referring MD/NP/PA: Jennette Kettle, MD PCP: Charlott Rakes, MD  Consultants: Vanetta Mulders, MD nephrology Patient coming from: home via EMS  Chief Complaint: Shortness of breath  I have personally briefly reviewed patient's old medical records in [place]   HPI: Tina Gomez is a 65 y.o. female with medical history significant of HTN, HLD, COPD, CAD, systolic CHF, diabetes mellitus type 2, ESRD on HD(MWF), CVA with residual right-sided weakness, and anemia requiring prior transfusions presents with complaints of shortness of breath over the last few days.  She reports last dialyzing was on 4/1.  Due to nausea and vomiting that she relates to her gastroparesis she had missed her hemodialysis appointment on 4/4.  Complains of shortness of breath symptoms worsening whenever she is trying to lay down at night.  She does report intermittent cough after receiving breathing treatment while here in the hospital.  Patient feels that she needs to be on oxygen at least at night, but when she was seen by her primary care provider yesterday her oxygen did not drop low enough to qualify.  She had just had a cardiac cath on 3/22 which revealed multivessel coronary calcification involving all coronary arteries, but no stentable lesions were appreciated.  She has the hemodialysis catheter still in her right chest wall, but states that she just recently started being able to use fistula.  Patient denies having any recent fevers or chills.  Since her episode of nausea and vomiting on Monday she denies any further ports.  ED Course: Upon admission into the emergency department patient was seen to be afebrile, respirations 15-22, blood pressures 153/54-190 6/87, and O2 saturation maintained on room air.  Labs significant for WBC 11.3, hemoglobin 7.8, potassium 4.4, BUN 89, creatinine 8.45, glucose 159, calcium 7.9, BNP  1380, and high-sensitivity troponin 24->27.  Chest x-ray significant for acute on chronic interstitial opacity suspected secondary to pulmonary edema superimposed on chronic emphysema and developing fibrosis.  COVID-19 and influenza screening was negative.  Review of Systems  Constitutional: Positive for malaise/fatigue. Negative for fever.  Eyes: Negative for photophobia and pain.  Respiratory: Positive for cough and shortness of breath.   Cardiovascular: Negative for leg swelling.  Gastrointestinal: Negative for abdominal pain, nausea and vomiting.  Genitourinary: Negative for dysuria and hematuria.  Neurological: Negative for focal weakness and loss of consciousness.  Psychiatric/Behavioral: The patient has insomnia.     Past Medical History:  Diagnosis Date  . Anemia   . Chronic kidney disease   . COPD (chronic obstructive pulmonary disease) ([place]) 05/2019   no inhaler  . Diabetes mellitus without complication ([place])    no meds - diet controlled  . History of blood transfusion 09/2020   1 unit  . HLD (hyperlipidemia)   . Hypertension   . PONV (postoperative nausea and vomiting)   . Restless legs syndrome (RLS)   . Stroke [facility])     Past Surgical History:  Procedure Laterality Date  . AV FISTULA PLACEMENT Left 07/27/2020   Procedure: LEFT BRACHIO-BASILIC ARTERIOVENOUS (AV) FISTULA CREATION;  Surgeon: Cherre Robins, MD;  Location: [place] [facility];  Service: Vascular;  Laterality: Left;  . BASCILIC VEIN TRANSPOSITION Left 11/02/2020   Procedure: LEFT UPPER ARM SECOND STAGE BASILIC VEIN TRANSPOSITION;  Surgeon: Cherre Robins, MD;  Location: [place];  Service: Vascular;  Laterality: Left;  PERIPHERAL NERVE BLOCK  . CESAREAN SECTION     x  1  . ESOPHAGOGASTRODUODENOSCOPY (EGD) WITH PROPOFOL N/A 05/26/2020   Procedure: ESOPHAGOGASTRODUODENOSCOPY (EGD) WITH PROPOFOL;  Surgeon: Irene Shipper, MD;  Location: [facility] ENDOSCOPY;  Service: Endoscopy;  Laterality: N/A;  . INSERTION OF DIALYSIS CATHETER  Right 07/27/2020   Procedure: INSERTION OF RIGHT INTERNAL JUGULAR TUNNELED DIALYSIS CATHETER;  Surgeon: Cherre Robins, MD;  Location: [place];  Service: Vascular;  Laterality: Right;  . IR FLUORO GUIDE CV LINE RIGHT  07/25/2020  . IR US GUIDE VASC ACCESS RIGHT  07/25/2020  . LEFT HEART CATH AND CORONARY ANGIOGRAPHY N/A 11/28/2020   Procedure: LEFT HEART CATH AND CORONARY ANGIOGRAPHY;  Surgeon: Troy Sine, MD;  Location: [place] CV LAB;  Service: Cardiovascular;  Laterality: N/A;  . TONSILLECTOMY    . UPPER GI ENDOSCOPY     growth removed from voice box  . WRIST SURGERY Left    ganglion cyst removal     reports that she has been smoking cigarettes. She has a 2.50 pack-year smoking history. She has never used smokeless tobacco. She reports that she does not drink alcohol and does not use drugs.  Allergies  Allergen Reactions  . Codeine Nausea And Vomiting  . Adhesive [Tape] Other (See Comments)    Tape breaks out the skin if it is left on for a lengthy period of time    Family History  Problem Relation Age of Onset  . Stroke Mother   . Hypertension Mother   . Hyperlipidemia Mother   . Hypertension Father   . Hyperlipidemia Father     Prior to Admission medications   Medication Sig Start Date End Date Taking? Authorizing Provider  albuterol (PROVENTIL) (2.5 MG/3ML) 0.083% nebulizer solution Take 3 mLs (2.5 mg total) by nebulization every 6 (six) hours as needed for wheezing or shortness of breath. 12/12/20   Charlott Rakes, MD  albuterol (VENTOLIN HFA) 108 (90 Base) MCG/ACT inhaler Inhale 2 puffs into the lungs every 6 (six) hours as needed for wheezing or shortness of breath. 12/12/20   Charlott Rakes, MD  aspirin 81 MG chewable tablet Chew 1 tablet (81 mg total) by mouth daily. Patient taking differently: Chew 325 mg by mouth daily. 11/09/20   Lilland, Alana, DO  atorvastatin (LIPITOR) 40 MG tablet TAKE 1 TABLET BY MOUTH DAILY AT 6 PM. Patient taking differently: Take 40 mg  by mouth every evening. 05/23/20   Charlott Rakes, MD  B Complex-C-Zn-Folic Acid (DIALYVITE 509 WITH ZINC) 0.8 MG TABS Take 1 tablet by mouth at bedtime. 08/12/20   [provider]  Blood Glucose Monitoring Suppl (BLOOD GLUCOSE METER) kit Use as instructed 11/01/13   Reyne Dumas, MD  Blood Glucose Monitoring Suppl (TRUE METRIX METER) DEVI 1 each by Does not apply route 3 (three) times daily before meals. 06/02/20   Charlott Rakes, MD  calcium acetate (PHOSLO) 667 MG capsule Take 2,001 mg by mouth 3 (three) times daily with meals. 09/19/20   [provider]  calcium acetate (PHOSLO) 667 MG capsule TAKE 1 CAPSULE BY MOUTH 3 TIMES DAILY WITH MEALS AND 1 CAPSULE WITH SNACKS (2 SNACKS A DAY). 09/07/20 09/07/21  Corliss Parish, MD  calcium carbonate (OSCAL) 1500 (600 Ca) MG TABS tablet TAKE 1 TABLET (500 MG OF ELEMENTAL CALCIUM TOTAL) BY MOUTH TWO TIMES DAILY WITH A MEAL. 07/31/20 07/31/21  Terrilee Croak, MD  carvedilol (COREG) 12.5 MG tablet TAKE 1 TABLET (12.5 MG TOTAL) BY MOUTH 2 (TWO) TIMES DAILY WITH A MEAL. 06/15/20 06/15/21  Charlott Rakes, MD  FLUoxetine (  PROZAC) 20 MG capsule Take 1 capsule (20 mg total) by mouth daily. 12/12/20   Charlott Rakes, MD  gabapentin (NEURONTIN) 100 MG capsule Take 1 capsule (100 mg total) by mouth at bedtime. 12/12/20   Charlott Rakes, MD  glucose blood (TRUETEST TEST) test strip Use as instructed 06/02/20   Charlott Rakes, MD  hydrOXYzine (ATARAX/VISTARIL) 25 MG tablet Take 1 tablet (25 mg total) by mouth 3 (three) times daily as needed. 12/12/20   Charlott Rakes, MD  Lancets (FREESTYLE) lancets Use as instructed 11/01/13   Reyne Dumas, MD  losartan (COZAAR) 50 MG tablet TAKE 1 TABLET (50 MG TOTAL) BY MOUTH DAILY. 11/09/20 11/09/21  Lilland, Alana, DO  metoCLOPramide (REGLAN) 5 MG tablet TAKE 1 TABLET BY MOUTH FOUR TIMES DAILY, BEFORE MEALS AND AT BEDTIME. 11/13/20 11/13/21  Willia Craze, NP  rOPINIRole (REQUIP) 0.25 MG tablet TAKE 1 TABLET BY MOUTH AT  BEDTIME AS NEEDED FOR RESTLESS LEGS Patient not taking: Reported on 12/12/2020 10/02/20 10/02/21  Corliss Parish, MD  rOPINIRole (REQUIP) 0.5 MG tablet TAKE 1 TABLET BY MOUTH 30 MINUTES PRIOR TO DIALYSIS AND ANOTHER TABLET MID RX IF NEEDED Patient not taking: Reported on 12/12/2020 11/15/20 11/15/21  Corliss Parish, MD  rOPINIRole (REQUIP) 0.5 MG tablet TAKE 1 TABLET BY MOUTH AT BEDTIME Patient not taking: Reported on 12/12/2020 10/13/20 10/13/21  Corliss Parish, MD  rOPINIRole (REQUIP) 1 MG tablet TAKE 1 TABLET BY MOUTH TWICE A DAY Patient not taking: Reported on 12/12/2020 12/06/20 12/06/21  Corliss Parish, MD  rOPINIRole (REQUIP) 2 MG tablet Take 2 mg by mouth in the morning and at bedtime.    [provider]  rOPINIRole (REQUIP) 2 MG tablet TAKE 1 TABLET BY MOUTH EVERY MORNING BEFORE HD TREATMENT AND TAKE 1 TABLET BY MOUTH EVERY NIGHT AT BEDTIME.STOP IF FELLING DROWSY 11/22/20 11/22/21  Adelfa Koh, NP  rOPINIRole (REQUIP) 2 MG tablet TAKE 1 TABLET BY MOUTH ONCE A DAY Patient not taking: Reported on 12/12/2020 10/25/20 10/25/21  Corliss Parish, MD  TRUEplus Lancets 28G MISC 1 each by Does not apply route 3 (three) times daily before meals. 06/02/20   Charlott Rakes, MD  amLODipine (NORVASC) 10 MG tablet Take 1 tablet (10 mg total) by mouth daily. 08/01/20 11/09/20  Terrilee Croak, MD  hydrALAZINE (APRESOLINE) 25 MG tablet Take 1 tablet (25 mg total) by mouth every 8 (eight) hours. 07/31/20 11/09/20  Terrilee Croak, MD  omeprazole (PRILOSEC) 20 MG capsule TAKE 1 CAPSULE (20 MG TOTAL) BY MOUTH 2 (TWO) TIMES DAILY BEFORE A MEAL. 08/01/20 08/22/20  Charlott Rakes, MD    Physical Exam:  Constitutional: Older female who appears to be in some acute respiratory distress Vitals:   12/13/20 0600 12/13/20 0700 12/13/20 0745 12/13/20 0900  BP: (!) 188/95 (!) 165/57 (!) 166/80 (!) 182/67  Pulse: 78 72 72 69  Resp: (!) 21 (!) 21 19 (!) 22  Temp:      TempSrc:      SpO2: 95% 97% 96%  97%  Weight:      Height:       Eyes: PERRL, lids and conjunctivae normal ENMT: Mucous membranes are moist. Posterior pharynx clear of any exudate or lesions.Normal dentition.  Neck: normal, supple, no masses, no thyromegaly.  Positive JVD Respiratory: Tachypneic with decreased aeration and possible crackles appreciated in both lungs.  Patient currently on 5 L nasal cannula.  Able to talk in shortened sentences. Cardiovascular: Regular rate and rhythm, no murmurs / rubs / gallops. No extremity edema.  2+ pedal pulses. No carotid bruits.  Right chest wall hemodialysis catheter in place.  Left upper extremity fistula appreciated. Abdomen: no tenderness, no masses palpated. No hepatosplenomegaly. Bowel sounds positive.  Musculoskeletal: no clubbing / cyanosis. No joint deformity upper and lower extremities. Good ROM, no contractures. Normal muscle tone.  Skin: no rashes, lesions, ulcers. No induration Neurologic: CN 2-12 grossly intact. Sensation intact, DTR normal. Strength 5/5 in all 4.  Psychiatric: Normal judgment and insight. Alert and oriented x 3. Normal mood.     Labs on Admission: I have personally reviewed following labs and imaging studies  CBC: Recent Labs  Lab 12/13/20 0243  WBC 11.3*  NEUTROABS 8.9*  HGB 7.8*  HCT 25.1*  MCV 96.2  PLT 161   Basic Metabolic Panel: Recent Labs  Lab 12/13/20 0243  NA 140  K 4.4  CL 102  CO2 21*  GLUCOSE 152*  BUN 84*  CREATININE 8.45*  CALCIUM 7.9*   GFR: Estimated Creatinine Clearance: 6.7 mL/min (A) (by C-G formula based on SCr of 8.45 mg/dL (H)). Liver Function Tests: Recent Labs  Lab 12/13/20 0243  AST 10*  ALT 9  ALKPHOS 72  BILITOT 0.3  PROT 6.1*  ALBUMIN 2.9*   No results for input(s): LIPASE, AMYLASE in the last 168 hours. No results for input(s): AMMONIA in the last 168 hours. Coagulation Profile: No results for input(s): INR, PROTIME in the last 168 hours. Cardiac Enzymes: No results for input(s): CKTOTAL,  CKMB, CKMBINDEX, TROPONINI in the last 168 hours. BNP (last 3 results) No results for input(s): PROBNP in the last 8760 hours. HbA1C: No results for input(s): HGBA1C in the last 72 hours. CBG: No results for input(s): GLUCAP in the last 168 hours. Lipid Profile: No results for input(s): CHOL, HDL, LDLCALC, TRIG, CHOLHDL, LDLDIRECT in the last 72 hours. Thyroid Function Tests: No results for input(s): TSH, T4TOTAL, FREET4, T3FREE, THYROIDAB in the last 72 hours. Anemia Panel: No results for input(s): VITAMINB12, FOLATE, FERRITIN, TIBC, IRON, RETICCTPCT in the last 72 hours. Urine analysis:    Component Value Date/Time   COLORURINE YELLOW 07/23/2020 0146   APPEARANCEUR CLOUDY (A) 07/23/2020 0146   LABSPEC 1.010 07/23/2020 0146   PHURINE 5.0 07/23/2020 0146   GLUCOSEU 50 (A) 07/23/2020 0146   HGBUR MODERATE (A) 07/23/2020 0146   BILIRUBINUR NEGATIVE 07/23/2020 0146   KETONESUR NEGATIVE 07/23/2020 0146   PROTEINUR >=300 (A) 07/23/2020 0146   UROBILINOGEN 1.0 10/22/2013 1626   NITRITE NEGATIVE 07/23/2020 0146   LEUKOCYTESUR NEGATIVE 07/23/2020 0146   Sepsis Labs: Recent Results (from the past 240 hour(s))  Resp Panel by RT-PCR (Flu A&B, Covid) Nasopharyngeal Swab     Status: None   Collection Time: 12/13/20  2:44 AM   Specimen: Nasopharyngeal Swab; Nasopharyngeal(NP) swabs in vial transport medium  Result Value Ref Range Status   SARS Coronavirus 2 by RT PCR NEGATIVE NEGATIVE Final    Comment: (NOTE) SARS-CoV-2 target nucleic acids are NOT DETECTED.  The SARS-CoV-2 RNA is generally detectable in upper respiratory specimens during the acute phase of infection. The lowest concentration of SARS-CoV-2 viral copies this assay can detect is 138 copies/mL. A negative result does not preclude SARS-Cov-2 infection and should not be used as the sole basis for treatment or other patient management decisions. A negative result may occur with  improper specimen collection/handling,  submission of specimen other than nasopharyngeal swab, presence of viral mutation(s) within the areas targeted by this assay, and inadequate number of viral copies(<138 copies/mL). A negative  result must be combined with clinical observations, patient history, and epidemiological information. The expected result is Negative.  Fact Sheet for Patients:  EntrepreneurPulse.com.au  Fact Sheet for Healthcare Providers:  IncredibleEmployment.be  This test is no t yet approved or cleared by the [place] FDA and  has been authorized for detection and/or diagnosis of SARS-CoV-2 by FDA under an Emergency Use Authorization (EUA). This EUA will remain  in effect (meaning this test can be used) for the duration of the COVID-19 declaration under Section 564(b)(1) of the Act, 21 U.S.C.section 360bbb-3(b)(1), unless the authorization is terminated  or revoked sooner.       Influenza A by PCR NEGATIVE NEGATIVE Final   Influenza B by PCR NEGATIVE NEGATIVE Final    Comment: (NOTE) The Xpert Xpress SARS-CoV-2/FLU/RSV plus assay is intended as an aid in the diagnosis of influenza from Nasopharyngeal swab specimens and should not be used as a sole basis for treatment. Nasal washings and aspirates are unacceptable for Xpert Xpress SARS-CoV-2/FLU/RSV testing.  Fact Sheet for Patients: EntrepreneurPulse.com.au  Fact Sheet for Healthcare Providers: IncredibleEmployment.be  This test is not yet approved or cleared by the [place] FDA and has been authorized for detection and/or diagnosis of SARS-CoV-2 by FDA under an Emergency Use Authorization (EUA). This EUA will remain in effect (meaning this test can be used) for the duration of the COVID-19 declaration under Section 564(b)(1) of the Act, 21 U.S.C. section 360bbb-3(b)(1), unless the authorization is terminated or revoked.  Performed at [facility], [place] [address], [place], [place] [address]      Radiological Exams on Admission: DG Chest 1 View  Result Date: 12/13/2020 CLINICAL DATA:  65 year old female with shortness of breath.  COPD. EXAM: CHEST  1 VIEW COMPARISON:  Portable chest 11/07/2020 and earlier. FINDINGS: Portable AP semi upright view at 0352 hours. Stable lung volumes. Stable cardiac size and mediastinal contours. Chronic mild cardiomegaly. Visualized tracheal air column is within normal limits. Stable right chest dual lumen dialysis type catheter. Chronic increased interstitial markings throughout the lungs, with early peripheral honeycombing demonstrated on a 2010 chest CT. Chronic upper lobe emphysema. No superimposed pneumothorax, pleural effusion. But interstitial markings remain increased compared to November, similar to last month. No consolidation. Stable visualized osseous structures. IMPRESSION: Acute on chronic interstitial opacity similar to last month and suspected due to pulmonary edema superimposed on chronic emphysema and developing pulmonary fibrosis. Electronically Signed   By: Genevie Ann M.D.   On: 12/13/2020 03:59    EKG: Independently reviewed.  Sinus rhythm at 77 bpm  Assessment/Plan Acute respiratory failure with hypoxia secondary to fluid overload: Patient presents with complaints of shortness of breath after recently missing hemodialysis on 4/4.  Allises session on 4/1.  Chest x-ray showing an area edema superimposed on chronic emphysema and developing pulmonary fibrosis. -Admit to medical telemetry bed -Continuous pulse oximetry with nasal cannula oxygen maintain O2 saturation greater than 92% -HD per nephrology  ESRD on HD: Patient normally dialyzes Monday, Wednesday, and Friday.  Patient has left upper extremity fistula is able to be used, but still has right chest wall hemodialysis catheter in place. -Continue current medication regimen -HD per nephrology  Systolic congestive heart failure: Acute on chronic.   BNP elevated at 1380.6.  Last EF noted to be 30% with grade 2 diastolic dysfunction.  -Fluid status to be addressed with hemodialysis  CAD elevated troponin: On admission high-sensitivity troponin 24-> 27 without acute signs of ischemia on EKG. Recent cardiac cath  showed multivessel coronary artery calcifications without obstruction from last month. -Continue ASA and statin  Essential hypertension: Blood pressures initially elevated up to 196/89. -Continue Coreg and losartan  Anxiety/depression -Continue Prozac and hydroxyzine as needed for anxiety  Diabetes mellitus type 2 with gastroparesis: On admission glucose is mildly elevated at 152.  Patient reports missing dialysis due to her vomiting that she relates to history of gastroparesis last hemoglobin A1c the was 6.4 on 11/08/2020.  Patient appears to be diet controlled. -Hypoglycemic protocols -Continue Reglan as needed -CBGs before every meal and at bedtime with very sensitive sliding scale insulin  COPD/emphysema: Patient without acute wheezing appreciated on physical exam. -Albuterol nebs as needed for shortness of breath/wheezing.  Patient likely needs to be on a maintenance medication  Hyperlipidemia  Anemia of chronic disease: Hemoglobin 7.8 g/dL which appears near patient's baseline which ranges from 7-8 g/dL. -Consider transfusion with hemodialysis, but we will hold off transfusing blood now due to patient being fluid overloaded    DVT prophylaxis: Heparin Code Status: Full Family Communication: None Disposition Plan: Home once medically stable Consults called: Nephrology Admission status: Inpatient require more than 2 midnight stay for need of hemodialysis for fluid overload  Norval Morton MD [facility]   If 7PM-7AM, please contact night-coverage   12/13/2020, 9:28 AM

## 2020-12-13 NOTE — Progress Notes (Signed)
Called by RN that pt having increased SOB and concerned that dialysis is being delayed and feels she is getting worse. Pt and family wants to speak with MD.  I went to bedside and discussed concerns with pt. Tina Gomez is sitting up and leaning forward in bed and states her respiratory status has worsened over the past few hours and she feels like she will not make it to morning.  Called and discussed with nephrology, Dr. Augustin Coupe, who contacted HD RN who stated that pt is next on schedule for dialysis tonight once the patient currently being treated is completed.

## 2020-12-13 NOTE — ED Provider Notes (Signed)
[facility] EMERGENCY DEPARTMENT Provider Note   CSN: [id number] Arrival date & time: 12/13/20  0215     History Chief Complaint  Patient presents with  . Shortness of Breath    Tina Gomez is a 65 y.o. female.  Patient is a 65 year old female with extensive past medical history including COPD, CHF, diabetes, and end-stage renal disease for which she is on hemodialysis.  Patient presents today with complaints of shortness of breath.  She reports a several day history of difficulty breathing, especially when she lays down to sleep at night.  She also reports being able to walk only short distances before she becomes dyspneic.  She recently had a heart cath performed showing nonocclusive/nonobstructive multivessel disease.  She denies any fevers or chills.  She denies any chest pain or cough.  Patient missed dialysis on Monday secondary to vomiting related to gastroparesis.  The history is provided by the patient.  Shortness of Breath Severity:  Moderate Onset quality:  Gradual Timing:  Constant Progression:  Worsening Chronicity:  New Relieved by:  Nothing Exacerbated by: Lying flat, and exertion. Ineffective treatments:  None tried      Past Medical History:  Diagnosis Date  . Anemia   . Chronic kidney disease   . COPD (chronic obstructive pulmonary disease) ([place]) 05/2019   no inhaler  . Diabetes mellitus without complication ([place])    no meds - diet controlled  . History of blood transfusion 09/2020   1 unit  . HLD (hyperlipidemia)   . Hypertension   . PONV (postoperative nausea and vomiting)   . Restless legs syndrome (RLS)   . Stroke [facility])     Patient Active Problem List   Diagnosis Date Noted  . Shortness of breath   . Acute exacerbation of CHF (congestive heart failure) ([place]) 11/07/2020  . Protein-calorie malnutrition, severe 07/28/2020  . ARF (acute renal failure) ([place]) 07/23/2020  . Diabetic gastroparesis ([place])   . Abnormal finding  on GI tract imaging   . H. pylori infection   . Diverticulitis 05/24/2020  . History of stroke 05/24/2020  . Anemia 05/24/2020  . Nausea and vomiting 05/24/2020  . AKI (acute kidney injury) ([place]) 05/24/2020  . Dehydration 05/24/2020  . Hypomagnesemia 05/24/2020  . Hypokalemia 05/24/2020  . Tobacco abuse 05/24/2020  . Emphysema of lung ([place]) 05/24/2020  . Psoriasis 02/14/2016  . Type 2 diabetes mellitus with diabetic neuropathy, unspecified ([place]) 01/18/2014  . Essential hypertension, benign 01/18/2014  . GERD (gastroesophageal reflux disease) 01/18/2014  . Hyperlipidemia 01/18/2014  . Cerebral infarction ([place]) 10/22/2013  . 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 AV FISTULA PLACEMENT Left 07/27/2020   Procedure: LEFT BRACHIO-BASILIC ARTERIOVENOUS (AV) FISTULA CREATION;  Surgeon: Cherre Robins, MD;  Location: [facility];  Service: Vascular;  Laterality: Left;  . BASCILIC VEIN TRANSPOSITION Left 11/02/2020   Procedure: LEFT UPPER ARM SECOND STAGE BASILIC VEIN TRANSPOSITION;  Surgeon: Cherre Robins, MD;  Location: [facility];  Service: Vascular;  Laterality: Left;  PERIPHERAL NERVE BLOCK  . CESAREAN SECTION     x 1  . ESOPHAGOGASTRODUODENOSCOPY (EGD) WITH PROPOFOL N/A 05/26/2020   Procedure: ESOPHAGOGASTRODUODENOSCOPY (EGD) WITH PROPOFOL;  Surgeon: Irene Shipper, MD;  Location: [facility] ENDOSCOPY;  Service: Endoscopy;  Laterality: N/A;  . INSERTION OF DIALYSIS CATHETER Right 07/27/2020   Procedure: INSERTION OF RIGHT INTERNAL JUGULAR TUNNELED DIALYSIS CATHETER;  Surgeon: Cherre Robins, MD;  Location: [facility];  Service: Vascular;  Laterality: Right;  . IR FLUORO GUIDE CV LINE RIGHT  07/25/2020  . IR US GUIDE VASC ACCESS RIGHT  07/25/2020  . LEFT HEART CATH AND CORONARY ANGIOGRAPHY N/A 11/28/2020   Procedure: LEFT HEART CATH AND CORONARY ANGIOGRAPHY;  Surgeon: Troy Sine, MD;  Location: [facility] CV LAB;  Service: Cardiovascular;  Laterality:  N/A;  . TONSILLECTOMY    . UPPER GI ENDOSCOPY     growth removed from voice box  . WRIST SURGERY Left    ganglion cyst removal     OB History   No obstetric history on file.     Family History  Problem Relation Age of Onset  . Stroke Mother   . Hypertension Mother   . Hyperlipidemia Mother   . Hypertension Father   . Hyperlipidemia Father     Social History   Tobacco Use  . Smoking status: Current Every Day Smoker    Packs/day: 0.25    Years: 10.00    Pack years: 2.50    Types: Cigarettes  . Smokeless tobacco: Never Used  . Tobacco comment: 5 cigs daily  Vaping Use  . Vaping Use: Never used  Substance Use Topics  . Alcohol use: No  . Drug use: No    Home Medications Prior to Admission medications   Medication Sig Start Date End Date Taking? Authorizing Provider  albuterol (PROVENTIL) (2.5 MG/3ML) 0.083% nebulizer solution Take 3 mLs (2.5 mg total) by nebulization every 6 (six) hours as needed for wheezing or shortness of breath. 12/12/20   Charlott Rakes, MD  albuterol (VENTOLIN HFA) 108 (90 Base) MCG/ACT inhaler Inhale 2 puffs into the lungs every 6 (six) hours as needed for wheezing or shortness of breath. 12/12/20   Charlott Rakes, MD  aspirin 81 MG chewable tablet Chew 1 tablet (81 mg total) by mouth daily. Patient taking differently: Chew 325 mg by mouth daily. 11/09/20   Lilland, Alana, DO  atorvastatin (LIPITOR) 40 MG tablet TAKE 1 TABLET BY MOUTH DAILY AT 6 PM. Patient taking differently: Take 40 mg by mouth every evening. 05/23/20   Charlott Rakes, MD  B Complex-C-Zn-Folic Acid (DIALYVITE 671 WITH ZINC) 0.8 MG TABS Take 1 tablet by mouth at bedtime. 08/12/20   [provider]  Blood Glucose Monitoring Suppl (BLOOD GLUCOSE METER) kit Use as instructed 11/01/13   Reyne Dumas, MD  Blood Glucose Monitoring Suppl (TRUE METRIX METER) DEVI 1 each by Does not apply route 3 (three) times daily before meals. 06/02/20   Charlott Rakes, MD  calcium acetate  (PHOSLO) 667 MG capsule Take 2,001 mg by mouth 3 (three) times daily with meals. 09/19/20   [provider]  calcium acetate (PHOSLO) 667 MG capsule TAKE 1 CAPSULE BY MOUTH 3 TIMES DAILY WITH MEALS AND 1 CAPSULE WITH SNACKS (2 SNACKS A DAY). 09/07/20 09/07/21  Corliss Parish, MD  calcium carbonate (OSCAL) 1500 (600 Ca) MG TABS tablet TAKE 1 TABLET (500 MG OF ELEMENTAL CALCIUM TOTAL) BY MOUTH TWO TIMES DAILY WITH A MEAL. 07/31/20 07/31/21  Terrilee Croak, MD  carvedilol (COREG) 12.5 MG tablet TAKE 1 TABLET (12.5 MG TOTAL) BY MOUTH 2 (TWO) TIMES DAILY WITH A MEAL. 06/15/20 06/15/21  Charlott Rakes, MD  FLUoxetine (PROZAC) 20 MG capsule Take 1 capsule (20 mg total) by mouth daily. 12/12/20   Charlott Rakes, MD  gabapentin (NEURONTIN) 100 MG capsule Take 1 capsule (100 mg total) by mouth at bedtime. 12/12/20   Charlott Rakes, MD  glucose blood (TRUETEST TEST) test strip Use  as instructed 06/02/20   Charlott Rakes, MD  hydrOXYzine (ATARAX/VISTARIL) 25 MG tablet Take 1 tablet (25 mg total) by mouth 3 (three) times daily as needed. 12/12/20   Charlott Rakes, MD  Lancets (FREESTYLE) lancets Use as instructed 11/01/13   Reyne Dumas, MD  losartan (COZAAR) 50 MG tablet TAKE 1 TABLET (50 MG TOTAL) BY MOUTH DAILY. 11/09/20 11/09/21  Lilland, Alana, DO  metoCLOPramide (REGLAN) 5 MG tablet TAKE 1 TABLET BY MOUTH FOUR TIMES DAILY, BEFORE MEALS AND AT BEDTIME. 11/13/20 11/13/21  Willia Craze, NP  rOPINIRole (REQUIP) 0.25 MG tablet TAKE 1 TABLET BY MOUTH AT BEDTIME AS NEEDED FOR RESTLESS LEGS Patient not taking: Reported on 12/12/2020 10/02/20 10/02/21  Corliss Parish, MD  rOPINIRole (REQUIP) 0.5 MG tablet TAKE 1 TABLET BY MOUTH 30 MINUTES PRIOR TO DIALYSIS AND ANOTHER TABLET MID RX IF NEEDED Patient not taking: Reported on 12/12/2020 11/15/20 11/15/21  Corliss Parish, MD  rOPINIRole (REQUIP) 0.5 MG tablet TAKE 1 TABLET BY MOUTH AT BEDTIME Patient not taking: Reported on 12/12/2020 10/13/20 10/13/21  Corliss Parish, MD  rOPINIRole (REQUIP) 1 MG tablet TAKE 1 TABLET BY MOUTH TWICE A DAY Patient not taking: Reported on 12/12/2020 12/06/20 12/06/21  Corliss Parish, MD  rOPINIRole (REQUIP) 2 MG tablet Take 2 mg by mouth in the morning and at bedtime.    [provider]  rOPINIRole (REQUIP) 2 MG tablet TAKE 1 TABLET BY MOUTH EVERY MORNING BEFORE HD TREATMENT AND TAKE 1 TABLET BY MOUTH EVERY NIGHT AT BEDTIME.STOP IF FELLING DROWSY 11/22/20 11/22/21  Adelfa Koh, NP  rOPINIRole (REQUIP) 2 MG tablet TAKE 1 TABLET BY MOUTH ONCE A DAY Patient not taking: Reported on 12/12/2020 10/25/20 10/25/21  Corliss Parish, MD  TRUEplus Lancets 28G MISC 1 each by Does not apply route 3 (three) times daily before meals. 06/02/20   Charlott Rakes, MD  amLODipine (NORVASC) 10 MG tablet Take 1 tablet (10 mg total) by mouth daily. 08/01/20 11/09/20  Terrilee Croak, MD  hydrALAZINE (APRESOLINE) 25 MG tablet Take 1 tablet (25 mg total) by mouth every 8 (eight) hours. 07/31/20 11/09/20  Terrilee Croak, MD  omeprazole (PRILOSEC) 20 MG capsule TAKE 1 CAPSULE (20 MG TOTAL) BY MOUTH 2 (TWO) TIMES DAILY BEFORE A MEAL. 08/01/20 08/22/20  Charlott Rakes, MD    Allergies    Codeine and Adhesive [tape]  Review of Systems   Review of Systems  Respiratory: Positive for shortness of breath.   All other systems reviewed and are negative.   Physical Exam Updated Vital Signs BP (!) 179/69 (BP Location: Right Arm)   Pulse 73   Temp 98.3 F (36.8 C) (Oral)   Resp 15   Ht _0  (1.575 m)   Wt 79.5 kg   LMP  (LMP Unknown)   SpO2 98%   BMI 32.06 kg/m   Physical Exam Vitals and nursing note reviewed.  Constitutional:      General: She is not in acute distress.    Appearance: She is well-developed. She is not diaphoretic.  HENT:     Head: Normocephalic and atraumatic.  Cardiovascular:     Rate and Rhythm: Normal rate and regular rhythm.     Heart sounds: No murmur heard. No friction rub. No gallop.   Pulmonary:      Effort: Pulmonary effort is normal. No respiratory distress.     Breath sounds: Examination of the right-lower field reveals rales. Examination of the left-lower field reveals rales. Rales present. No wheezing.  Abdominal:  General: Bowel sounds are normal. There is no distension.     Palpations: Abdomen is soft.     Tenderness: There is no abdominal tenderness.  Musculoskeletal:        General: Normal range of motion.     Cervical back: Normal range of motion and neck supple.     Right lower leg: No tenderness. No edema.     Left lower leg: No tenderness. No edema.  Skin:    General: Skin is warm and dry.  Neurological:     Mental Status: She is alert and oriented to person, place, and time.     ED Results / Procedures / Treatments   Labs (all labs ordered are listed, but only abnormal results are displayed) Labs Reviewed  CBC WITH DIFFERENTIAL/PLATELET - Abnormal; Notable for the following components:      Result Value   WBC 11.3 (*)    RBC 2.61 (*)    Hemoglobin 7.8 (*)    HCT 25.1 (*)    RDW 17.5 (*)    Neutro Abs 8.9 (*)    Abs Immature Granulocytes 0.10 (*)    All other components within normal limits  RESP PANEL BY RT-PCR (FLU A&B, COVID) [id number]  COMPREHENSIVE METABOLIC PANEL  BRAIN NATRIURETIC PEPTIDE  TROPONIN I (HIGH SENSITIVITY)    EKG EKG Interpretation  Date/Time:  Wednesday December 13 2020 02:24:30 EDT Ventricular Rate:  77 PR Interval:  254 QRS Duration: 88 QT Interval:  376 QTC Calculation: 425 R Axis:   70 Text Interpretation: Sinus rhythm with 1st degree A-V block Cannot rule out Anterior infarct , age undetermined Abnormal ECG Confirmed by Veryl Speak [phone]) on 12/13/2020 5:52:04 AM   Radiology No results found.  Procedures Procedures   Medications Ordered in ED Medications - No data to display  ED Course  I have reviewed the triage vital signs and the nursing notes.  Pertinent labs & imaging results that were available during  my care of the patient were reviewed by me and considered in my medical decision making (see chart for details).    MDM Rules/Calculators/A&P  Patient is a 65 year old female with medical history as per HPI, most notably end-stage renal disease on hemodialysis.  She missed dialysis on Monday and presents with shortness of breath.  Patient's oxygen saturations dropped to the upper 80s while she is off of oxygen.  At home she reports saturations at one point in the 70s when attempting to ambulate.  Chest x-ray shows volume overload and BNP is 1400.  Patient to be admitted to the hospitalist service.  She will likely require urgent dialysis.  CRITICAL CARE Performed by: Veryl Speak Total critical care time: 35 minutes Critical care time was exclusive of separately billable procedures and treating other patients. Critical care was necessary to treat or prevent imminent or life-threatening deterioration. Critical care was time spent personally by me on the following activities: development of treatment plan with patient and/or surrogate as well as nursing, discussions with consultants, evaluation of patient's response to treatment, examination of patient, obtaining history from patient or surrogate, ordering and performing treatments and interventions, ordering and review of laboratory studies, ordering and review of radiographic studies, pulse oximetry and re-evaluation of patient's condition.   Final Clinical Impression(s) / ED Diagnoses Final diagnoses:  Chest pain    Rx / DC Orders ED Discharge Orders    None       Veryl Speak, MD 12/13/20 [phone]

## 2020-12-13 NOTE — ED Triage Notes (Signed)
Shortness of breath x several days - pt went to PCP and pt is requesting to have oxygen at night. Pt having more difficulty sleeping and resting at night.    Hx of CHF and COPD. On room air sat, pt was 88%, placed on 2lpm via [place] - pt sat at 96%.

## 2020-12-13 NOTE — Progress Notes (Signed)
Heart Failure Navigator Progress Note  Assessed for Heart & Vascular TOC clinic readiness.  Unfortunately at this time the patient does not meet criteria due to ESRD on HD.   Navigator available for reassessment of patient.   Pricilla Holm, RN, BSN Heart Failure Nurse Navigator [phone]

## 2020-12-14 DIAGNOSIS — E8770 Fluid overload, unspecified: Principal | ICD-10-CM

## 2020-12-14 LAB — RENAL FUNCTION PANEL
Albumin: 2.8 g/dL — ABNORMAL LOW (ref 3.5–5.0)
Anion gap: 11 (ref 5–15)
BUN: 32 mg/dL — ABNORMAL HIGH (ref 8–23)
CO2: 25 mmol/L (ref 22–32)
Calcium: 7.9 mg/dL — ABNORMAL LOW (ref 8.9–10.3)
Chloride: 98 mmol/L (ref 98–111)
Creatinine, Ser: 4.04 mg/dL — ABNORMAL HIGH (ref 0.44–1.00)
GFR, Estimated: 12 mL/min — ABNORMAL LOW (ref >60)
Glucose, Bld: 137 mg/dL — ABNORMAL HIGH (ref 70–99)
Phosphorus: 5.2 mg/dL — ABNORMAL HIGH (ref 2.5–4.6)
Potassium: 3.3 mmol/L — ABNORMAL LOW (ref 3.5–5.1)
Sodium: 134 mmol/L — ABNORMAL LOW (ref 135–145)

## 2020-12-14 LAB — CBC
HCT: 23.1 % — ABNORMAL LOW (ref 36.0–46.0)
Hemoglobin: 7.3 g/dL — ABNORMAL LOW (ref 12.0–15.0)
MCH: 29 pg (ref 26.0–34.0)
MCHC: 31.6 g/dL (ref 30.0–36.0)
MCV: 91.7 fL (ref 80.0–100.0)
Platelets: 270 10*3/uL (ref 150–400)
RBC: 2.52 MIL/uL — ABNORMAL LOW (ref 3.87–5.11)
RDW: 17 % — ABNORMAL HIGH (ref 11.5–15.5)
WBC: 9.1 10*3/uL (ref 4.0–10.5)
nRBC: 0 % (ref 0.0–0.2)

## 2020-12-14 LAB — GLUCOSE, CAPILLARY
Glucose-Capillary: 139 mg/dL — ABNORMAL HIGH (ref 70–99)
Glucose-Capillary: 125 mg/dL — ABNORMAL HIGH (ref 70–99)
Glucose-Capillary: 159 mg/dL — ABNORMAL HIGH (ref 70–99)
Glucose-Capillary: 148 mg/dL — ABNORMAL HIGH (ref 70–99)
Glucose-Capillary: 199 mg/dL — ABNORMAL HIGH (ref 70–99)

## 2020-12-14 MED ORDER — HEPARIN SODIUM (PORCINE) 1000 UNIT/ML IJ SOLN
INTRAMUSCULAR | Status: AC
  Administered 2020-12-14: 3200 [IU] via INTRAVENOUS_CENTRAL
  Filled 2020-12-14: qty 4

## 2020-12-14 MED ORDER — DOXERCALCIFEROL 4 MCG/2ML IV SOLN
INTRAVENOUS | Status: AC
  Administered 2020-12-14: 2 ug via INTRAVENOUS
  Filled 2020-12-14: qty 2

## 2020-12-14 NOTE — Progress Notes (Signed)
[facility] Progress Note   Subjective:  Seen in room - looks markedly improved from yesterday. Denies dyspnea, but still requiring O2 (does not use at home). Net UF 2.2L with HD last night - she remains about 3kg above her dry weight. In discussions with daughter, she is wondering about getting home O2 - especially for night when her dyspnea is the worst, and also inquiring about a sleep study given gasping for air at night - told will need to ask her hospitalist about these items but agree they would be beneficial.  Objective Vitals:   12/14/20 0145 12/14/20 0200 12/14/20 0212 12/14/20 0340  BP: (!) 121/51 (!) 125/57 (!) 153/62 (!) 144/56  Pulse: 69 67 66 70  Resp:   18 (!) 21  Temp:   98.2 F (36.8 C) 97.7 F (36.5 C)  TempSrc:   Oral   SpO2:   97% 97%  Weight:   78.3 kg   Height:       Physical Exam General: Chronically ill appearing woman, NAD and laying nearly supine. On nasal O2. Heart: RRR; 2/6 murmur Lungs: Expiratory rhonchi, no distinct rales Abdomen: soft, non-tender Extremities: No LE edema Dialysis Access: L AVF + bruit and R [facility]  Additional Objective Labs: Basic Metabolic Panel: Recent Labs  Lab 12/13/20 0243 12/14/20 0423  NA 140 134*  K 4.4 3.3*  CL 102 98  CO2 21* 25  GLUCOSE 152* 137*  BUN 84* 32*  CREATININE 8.45* 4.04*  CALCIUM 7.9* 7.9*  PHOS  --  5.2*   Liver Function Tests: Recent Labs  Lab 12/13/20 0243 12/14/20 0423  AST 10*  --   ALT 9  --   ALKPHOS 72  --   BILITOT 0.3  --   PROT 6.1*  --   ALBUMIN 2.9* 2.8*   CBC: Recent Labs  Lab 12/13/20 0243 12/14/20 0423  WBC 11.3* 9.1  NEUTROABS 8.9*  --   HGB 7.8* 7.3*  HCT 25.1* 23.1*  MCV 96.2 91.7  PLT 299 270   Studies/Results: DG Chest 1 View  Result Date: 12/13/2020 CLINICAL DATA:  65 year old female with shortness of breath.  COPD. EXAM: CHEST  1 VIEW COMPARISON:  Portable chest 11/07/2020 and earlier. FINDINGS: Portable AP semi upright view at 0352  hours. Stable lung volumes. Stable cardiac size and mediastinal contours. Chronic mild cardiomegaly. Visualized tracheal air column is within normal limits. Stable right chest dual lumen dialysis type catheter. Chronic increased interstitial markings throughout the lungs, with early peripheral honeycombing demonstrated on a 2010 chest CT. Chronic upper lobe emphysema. No superimposed pneumothorax, pleural effusion. But interstitial markings remain increased compared to November, similar to last month. No consolidation. Stable visualized osseous structures. IMPRESSION: Acute on chronic interstitial opacity similar to last month and suspected due to pulmonary edema superimposed on chronic emphysema and developing pulmonary fibrosis. Electronically Signed   By: Genevie Ann M.D.   On: 12/13/2020 03:59   Medications:  . (feeding supplement) PROSource Plus  30 mL Oral BID BM  . aspirin EC  325 mg Oral Daily  . atorvastatin  40 mg Oral QPM  . calcium acetate  1,334 mg Oral TID WC  . calcium acetate  667 mg Oral With snacks  . calcium carbonate  1,250 mg Oral BID WC  . carvedilol  12.5 mg Oral BID WC  . Chlorhexidine Gluconate Cloth  6 each Topical Q0600  . [START ON 12/15/2020] doxercalciferol  2 mcg Intravenous Q M,W,F-HD  . heparin  5,000 Units Subcutaneous Q8H  . insulin aspart  0-6 Units Subcutaneous TID WC  . losartan  50 mg Oral Daily   Dialysis Orders: MWF at [facility] ---> cuts HD time q HD, gets at most 2:45hr treatments 3:45hr, 400/500, EDW 75kg, 3K/2.5Ca, UFP #4, AVF, heparin 2000 bolus - Hectoral 53mg IV q HD - Venofer '100mg'$  x 10 ordered - s/p 4 of 10 so far - Mircera 226m IV q 2 weeks (last 3/28)  Assessment/Plan: 1. Dyspnea: Multifactorial - she does have pulmomary edema, but also with quite significant COPD and HFrEF and chronic anemia. Trying to offload pulm edema with dialysis. 2.  ESRD: Usual MWF schedule -- apparently has been having a rough time with HD. Cannot tolerating sitting for  full treatment d/t RLS/anxiety - just started on gabapentin and SSRI in addition to Requip which may help. So far, does not appear dialysis is adding to her QOL. Will attempt to fix the fixable and then reassess. Next HD 4/8 per usual MWF schedule. 3.  Hypertension/volume: BP remains, UF with HD as tolerated. 4.  Anemia: Hgb 7.3 - on max ESA as outpatient, not due for next dose yet. Consider transfusion given symptoms. 5.  Metabolic bone disease: CorrCa/Phos ok - continue home meds. 6.  Nutrition: Alb low, continue protein supplements. 7.  HFrEF (EF 30%): Recent LHC with non-obstuctive CAD  8.  COPD: On albuterol only - likely needs LABA/ICS or something. Can she be evaluated for home oxygen while here? Discussed smoking cessation. 9.  T2DM/gastroparesis   KaVeneta PentonPA-C 12/14/2020, 10:29 AM  [facility]

## 2020-12-14 NOTE — Progress Notes (Signed)
PROGRESS NOTE    Tina Gomez  [id number] DOB: 02-10-1956 DOA: 12/13/2020 PCP: Charlott Rakes, MD   Chief Complaint  Patient presents with  . Shortness of Breath  Brief Narrative: 5 old female with multiple comorbidities with hypertension, chronic systolic CHF, COPD, CAD, HLD, T2DM, ESRD on HD MWF, history of CVA with residual right-sided weakness, chronic anemia requiring previous transfusion presents with shortness of breath for last few days, last dialyzed on 4/1 but was having nausea vomiting the setting of gastroparesis and missed her dialysis appointment 4/4.   Recent [place] hospitalization: [facility] admit 3/1-11/09/20 with CHF exacerbation, + pulm edema + R effusion   - ED visit for syncope after HD on 11/27/20, but CXR with B effusions/vascular congestion. 1L NS given + KCl 48mq given. - LHC 11/28/20 with non-obstructive CAD, but EF 30% - felt out of proportion for degree of CAD. - PCP appt 4/5 - started on several meds (SSRI, gabapentin, albuterol)  ED Course: Upon admission into the emergency department patient was seen to be afebrile, respirations 15-22, blood pressures 153/54-190 6/87, and O2 saturation maintained on room air.  Labs significant for WBC 11.3, hemoglobin 7.8, potassium 4.4, BUN 89, creatinine 8.45, glucose 159, calcium 7.9, BNP 1380, and high-sensitivity troponin 24->27.  Chest x-ray significant for acute on chronic interstitial opacity suspected secondary to pulmonary edema superimposed on chronic emphysema and developing fibrosis.  COVID-19 and influenza screening was negative.  Patient was admitted seen by nephrology for urgent dialysis  Subjective: Seen and examined this morning. Breathing much better, on 3l , not on home o2, able ot lay almost flat last night after HD but not back  to normal  Assessment & Plan:  Acute pulmonary edema/fluid overload Missed dialysis Acute respiratory failure with hypoxia and dyspnea: multifactorial with acute pulmonary  edema from fluid overload in the setting of COPD HFrEF chronic anemia. Status post urgent dialysis last night 4/6  ESRD on HD MWF: Apparently having a rough time with HD not tolerating sitting for full treatment due to RLS/anxiety issues and was placed on Neurontin and SSRI along with Requip recently.  Nephrology managing. Recent Labs  Lab 12/13/20 0243 12/14/20 0423  BUN 84* 32*  CREATININE 8.45* 4.04*   Hypokalemia adjust with dialysis. Recent Labs  Lab 12/13/20 0243 12/14/20 0423  K 4.4 3.3*   Anemia of chronic kidney disease continue ESA as outpatient.  Monitor H&H transfuse if less than 7 g. May need transfusion if ongoing dyspnea despite HD- await nephro inputs. Recent Labs  Lab 12/13/20 0243 12/14/20 0423  HGB 7.8* 7.3*  HCT [id number] [id number]   Metabolic bone disease calcium okay continue home meds  History of CAD but unremarkable LHC recently.  Mild elevated troponin in the setting of respiratory distress [id number] Chronic systolic CHF EF [id number] recent LHC with nonobstructive CAD  Address fluid management with dialysis. Net IO Since Admission: -1,726 mL [12/14/20 1000]  Filed Weights   12/13/20 0225 12/13/20 2235 12/14/20 0212  Weight: 79.5 kg 80.2 kg 78.3 kg   COPD/emphysema: Continue as needed albuterol.  Type 2 diabetes mellitus with diabetic neuropathy diabetic gastroparesis.  Hemoglobin A1c is stable 6.4 on 3/3.  Blood sugar well controlled. No more naesea or vomiting today. Always nauseas I nam but improved as she keeps her diet on schedule and takes 1-2 times  reglan per week , zofran does not work. Recent Labs  Lab 12/14/20 0343 12/14/20 0719  GLUCAP 139* 125*   Essential hypertension, benign: Poorly  controlled on admission in the setting of fluid overload.  Currently appears improved.  Continue Coreg and losartan.  Anxiety/depression continue Prozac and hydroxyzine multiple medication.  Morbid obesity BMI 31 will benefit with weight loss. Diet Order             Diet renal/carb modified with fluid restriction Diet-HS Snack? Nothing; Fluid restriction: 1200 mL Fluid; Room service appropriate? Yes; Fluid consistency: Thin  Diet effective now                 Patient's Body mass index is 31.57 kg/m. DVT prophylaxis: heparin injection 5,000 Units Start: 12/13/20 1400 SCDs Start: 12/13/20 1001 Code Status:   Code Status: Full Code  Family Communication: plan of care discussed with patient and her daughter at bedside.  Status is: Inpatient Remains inpatient appropriate because:Inpatient level of care appropriate due to severity of illness  Dispo: The patient is from: Home              Anticipated d/c is to: TBD.  Obtain PT OT evaluation.              Patient currently is not medically stable to d/c.   Difficult to place patient No    Unresulted Labs (From admission, onward)          Start     Ordered   12/14/20 0500  Renal function panel  Daily,   R      12/13/20 1018         Medications reviewed:  Scheduled Meds: . (feeding supplement) PROSource Plus  30 mL Oral BID BM  . aspirin EC  325 mg Oral Daily  . atorvastatin  40 mg Oral QPM  . calcium acetate  1,334 mg Oral TID WC  . calcium acetate  667 mg Oral With snacks  . calcium carbonate  1,250 mg Oral BID WC  . carvedilol  12.5 mg Oral BID WC  . Chlorhexidine Gluconate Cloth  6 each Topical Q0600  . [START ON 12/15/2020] doxercalciferol  2 mcg Intravenous Q M,W,F-HD  . heparin  5,000 Units Subcutaneous Q8H  . insulin aspart  0-6 Units Subcutaneous TID WC  . losartan  50 mg Oral Daily   Continuous Infusions:  Consultants:see note  Procedures:see note  Antimicrobials: Anti-infectives (From admission, onward)   None     Culture/Microbiology No results found for: SDES, SPECREQUEST, CULT, REPTSTATUS  Other culture-see note  Objective: Vitals: Today's Vitals   12/14/20 0200 12/14/20 0212 12/14/20 0340 12/14/20 0900  BP: (!) 125/57 (!) 153/62 (!) 144/56   Pulse: 67 66  70   Resp:  18 (!) 21   Temp:  98.2 F (36.8 C) 97.7 F (36.5 C)   TempSrc:  Oral    SpO2:  97% 97%   Weight:  78.3 kg    Height:      PainSc:  0-No pain  0-No pain    Intake/Output Summary (Last 24 hours) at 12/14/2020 1000 Last data filed at 12/14/2020 0601 Gross per 24 hour  Intake 474 ml  Output 2200 ml  Net -1726 ml   Filed Weights   12/13/20 0225 12/13/20 2235 12/14/20 0212  Weight: 79.5 kg 80.2 kg 78.3 kg   Weight change: 0.7 kg  Intake/Output from previous day: 04/06 0701 - 04/07 0700 In: 474 [P.O.:474] Out: 2200  Intake/Output this shift: No intake/output data recorded. Filed Weights   12/13/20 0225 12/13/20 2235 12/14/20 0212  Weight: 79.5 kg 80.2 kg 78.3 kg  Examination: General exam: AAOx3 ,thin, NAD, weak appearing. HEENT:Oral mucosa moist, Ear/Nose WNL grossly,dentition normal. Respiratory system: bilaterally diminished,no use of accessory muscle, non tender. Cardiovascular system: S1 & S2 +, regular, No JVD. Gastrointestinal system: Abdomen soft, NT,ND, BS+. Nervous System:Alert, awake, moving extremities and grossly nonfocal Extremities: No edema, distal peripheral pulses palpable.  Skin: No rashes,no icterus. MSK: Normal muscle bulk,tone, power LUE avf+, chest HD cath- placed on 07/2020 when HD started.  Data Reviewed: I have personally reviewed following labs and imaging studies CBC: Recent Labs  Lab 12/13/20 0243 12/14/20 0423  WBC 11.3* 9.1  NEUTROABS 8.9*  --   HGB 7.8* 7.3*  HCT 25.1* 23.1*  MCV 96.2 91.7  PLT 299 [id number]   Basic Metabolic Panel: Recent Labs  Lab 12/13/20 0243 12/14/20 0423  NA 140 134*  K 4.4 3.3*  CL 102 98  CO2 21* 25  GLUCOSE 152* 137*  BUN 84* 32*  CREATININE 8.45* 4.04*  CALCIUM 7.9* 7.9*  PHOS  --  5.2*   GFR: Estimated Creatinine Clearance: 13.8 mL/min (A) (by C-G formula based on SCr of 4.04 mg/dL (H)). Liver Function Tests: Recent Labs  Lab 12/13/20 0243 12/14/20 0423  AST 10*  --   ALT 9  --    ALKPHOS 72  --   BILITOT 0.3  --   PROT 6.1*  --   ALBUMIN 2.9* 2.8*   No results for input(s): LIPASE, AMYLASE in the last 168 hours. No results for input(s): AMMONIA in the last 168 hours. Coagulation Profile: No results for input(s): INR, PROTIME in the last 168 hours. Cardiac Enzymes: No results for input(s): CKTOTAL, CKMB, CKMBINDEX, TROPONINI in the last 168 hours. BNP (last 3 results) No results for input(s): PROBNP in the last 8760 hours. HbA1C: No results for input(s): HGBA1C in the last 72 hours. CBG: Recent Labs  Lab 12/14/20 0343 12/14/20 0719  GLUCAP 139* 125*   Lipid Profile: No results for input(s): CHOL, HDL, LDLCALC, TRIG, CHOLHDL, LDLDIRECT in the last 72 hours. Thyroid Function Tests: No results for input(s): TSH, T4TOTAL, FREET4, T3FREE, THYROIDAB in the last 72 hours. Anemia Panel: No results for input(s): VITAMINB12, FOLATE, FERRITIN, TIBC, IRON, RETICCTPCT in the last 72 hours. Sepsis Labs: No results for input(s): PROCALCITON, LATICACIDVEN in the last 168 hours.  Recent Results (from the past 240 hour(s))  Resp Panel by RT-PCR (Flu A&B, Covid) Nasopharyngeal Swab     Status: None   Collection Time: 12/13/20  2:44 AM   Specimen: Nasopharyngeal Swab; Nasopharyngeal(NP) swabs in vial transport medium  Result Value Ref Range Status   SARS Coronavirus 2 by RT PCR NEGATIVE NEGATIVE Final    Comment: (NOTE) SARS-CoV-2 target nucleic acids are NOT DETECTED.  The SARS-CoV-2 RNA is generally detectable in upper respiratory specimens during the acute phase of infection. The lowest concentration of SARS-CoV-2 viral copies this assay can detect is 138 copies/mL. A negative result does not preclude SARS-Cov-2 infection and should not be used as the sole basis for treatment or other patient management decisions. A negative result may occur with  improper specimen collection/handling, submission of specimen other than nasopharyngeal swab, presence of viral  mutation(s) within the areas targeted by this assay, and inadequate number of viral copies(<138 copies/mL). A negative result must be combined with clinical observations, patient history, and epidemiological information. The expected result is Negative.  Fact Sheet for Patients:  EntrepreneurPulse.com.au  Fact Sheet for Healthcare Providers:  IncredibleEmployment.be  This test is no t yet approved or  cleared by the [place] and  has been authorized for detection and/or diagnosis of SARS-CoV-2 by FDA under an Emergency Use Authorization (EUA). This EUA will remain  in effect (meaning this test can be used) for the duration of the COVID-19 declaration under Section 564(b)(1) of the Act, 21 U.S.C.section 360bbb-3(b)(1), unless the authorization is terminated  or revoked sooner.       Influenza A by PCR NEGATIVE NEGATIVE Final   Influenza B by PCR NEGATIVE NEGATIVE Final    Comment: (NOTE) The Xpert Xpress SARS-CoV-2/FLU/RSV plus assay is intended as an aid in the diagnosis of influenza from Nasopharyngeal swab specimens and should not be used as a sole basis for treatment. Nasal washings and aspirates are unacceptable for Xpert Xpress SARS-CoV-2/FLU/RSV testing.  Fact Sheet for Patients: EntrepreneurPulse.com.au  Fact Sheet for Healthcare Providers: IncredibleEmployment.be  This test is not yet approved or cleared by the [place] FDA and has been authorized for detection and/or diagnosis of SARS-CoV-2 by FDA under an Emergency Use Authorization (EUA). This EUA will remain in effect (meaning this test can be used) for the duration of the COVID-19 declaration under Section 564(b)(1) of the Act, 21 U.S.C. section 360bbb-3(b)(1), unless the authorization is terminated or revoked.  Performed at [facility], [place] [address], [place], [place] [address]      Radiology Studies: DG Chest 1  View  Result Date: 12/13/2020 CLINICAL DATA:  65 year old female with shortness of breath.  COPD. EXAM: CHEST  1 VIEW COMPARISON:  Portable chest 11/07/2020 and earlier. FINDINGS: Portable AP semi upright view at 0352 hours. Stable lung volumes. Stable cardiac size and mediastinal contours. Chronic mild cardiomegaly. Visualized tracheal air column is within normal limits. Stable right chest dual lumen dialysis type catheter. Chronic increased interstitial markings throughout the lungs, with early peripheral honeycombing demonstrated on a 2010 chest CT. Chronic upper lobe emphysema. No superimposed pneumothorax, pleural effusion. But interstitial markings remain increased compared to November, similar to last month. No consolidation. Stable visualized osseous structures. IMPRESSION: Acute on chronic interstitial opacity similar to last month and suspected due to pulmonary edema superimposed on chronic emphysema and developing pulmonary fibrosis. Electronically Signed   By: Genevie Ann M.D.   On: 12/13/2020 03:59     LOS: 1 day   Antonieta Pert, MD [facility]  12/14/2020, 10:00 AM

## 2020-12-14 NOTE — Plan of Care (Signed)
  Problem: Education: Goal: Knowledge of disease and its progression will improve Outcome: Completed/Met

## 2020-12-14 NOTE — Evaluation (Signed)
Occupational Therapy Evaluation Patient Details Name: Tina Gomez MRN: [id number] DOB: [birth year] Today's Date: 12/14/2020    History of Present Illness 65 y.o. female with PMH HTN, HLD, COPD, CAD, systolic CHF, diabetes mellitus type 2, ESRD on HD(MWF), CVA with residual right-sided weakness, and anemia presenting with complaints of shortness of breath. Patient diagnosed with Acute respiratory failure with hypoxia secondary to fluid overload.   Clinical Impression   Patient lives with her family in a two level house, patient lives on main level. Patient is modified independent with self care tasks, reports her family watches her "like a hawk" and provides assist with whatever she needs. Patient uses walker at baseline but also furniture cruises in the house. Currently patient set up for UB ADL, set up-min G for LB ADL and min A for functional ambulation with HHA x1 due to x1 lateral loss of balance however patient able to self correct. Patient maintain 94% post ambulation on 3L O2. Recommend continued acute OT services to maximize patient endurance, balance and overall safety in order to facilitate D/C home with family assist as needed.     Follow Up Recommendations  No OT follow up    Equipment Recommendations  None recommended by OT       Precautions / Restrictions Precautions Precautions: Fall Precaution Comments: monitor O2 Restrictions Weight Bearing Restrictions: No      Mobility Bed Mobility               General bed mobility comments: seated EOB upon arrival    Transfers Overall transfer level: Needs assistance Equipment used: 1 person hand held assist Transfers: Sit to/from Stand Sit to Stand: Min guard              Balance Overall balance assessment: Mild deficits observed, not formally tested                                         ADL either performed or assessed with clinical judgement   ADL Overall ADL's : Needs  assistance/impaired Eating/Feeding: Independent;Sitting   Grooming: Set up;Sitting   Upper Body Bathing: Set up;Sitting   Lower Body Bathing: Min guard;Sit to/from stand   Upper Body Dressing : Set up;Sitting   Lower Body Dressing: Set up;Min guard;Sitting/lateral leans;Sit to/from stand Lower Body Dressing Details (indicate cue type and reason): seated with increased time patient able to don socks Toilet Transfer: Minimal assistance;Ambulation Toilet Transfer Details (indicate cue type and reason): patient participate in functional ambulation with HHA x1 and min A for safety with minor lateral loss of balance however able to recover Toileting- Clothing Manipulation and Hygiene: Min guard;Sit to/from stand       Functional mobility during ADLs: Min guard;Minimal assistance General ADL Comments: patient appears close to her baseline, reports use of walker at home but also states "I am familiar with everything there so sometimes I just hold onto furniture"                  Pertinent Vitals/Pain Pain Assessment: Faces Faces Pain Scale: Hurts little more Pain Location: L arm, R upper thigh Pain Descriptors / Indicators: Sore Pain Intervention(s): Monitored during session     Hand Dominance Right   Extremity/Trunk Assessment Upper Extremity Assessment Upper Extremity Assessment: Overall WFL for tasks assessed;RUE deficits/detail RUE Deficits / Details: hx of R sided weakness due to CVA however patient able to  perform tasks/ADLs with R UE without increased difficulty, not formally assessed.   Lower Extremity Assessment Lower Extremity Assessment: Defer to PT evaluation       Communication Communication Communication: No difficulties   Cognition Arousal/Alertness: Awake/alert Behavior During Therapy: WFL for tasks assessed/performed Overall Cognitive Status: Within Functional Limits for tasks assessed                                     General  Comments  VSS on 3L post mobility            Home Living Family/patient expects to be discharged to:: Private residence Living Arrangements: Children Available Help at Discharge: Family;Available 24 hours/day (DTR works from home) Type of Home: House Home Access: [place]: Two level;Able to live on main level with bedroom/bathroom     Bathroom Shower/Tub: Occupational psychologist: Standard     Home Equipment: Shower seat;Grab bars - tub/shower;Bedside commode;Grab bars - toilet;Walker - 4 wheels (put bedside commode over toilet)          Prior Functioning/Environment Level of Independence: Independent with assistive device(s)        Comments: doesn't use rollator all the time        OT Problem List: Decreased activity tolerance;Impaired balance (sitting and/or standing);Cardiopulmonary status limiting activity      OT Treatment/Interventions: Self-care/ADL training;Energy conservation;DME and/or AE instruction;Therapeutic activities;Patient/family education;Balance training    OT Goals(Current goals can be found in the care plan section) Acute Rehab OT Goals Patient Stated Goal: home tomorrow OT Goal Formulation: With patient Time For Goal Achievement: 12/28/20 Potential to Achieve Goals: Good  OT Frequency: Min 2X/week    AM-PAC OT "6 Clicks" Daily Activity     Outcome Measure Help from another person eating meals?: None Help from another person taking care of personal grooming?: A Little Help from another person toileting, which includes using toliet, bedpan, or urinal?: A Little Help from another person bathing (including washing, rinsing, drying)?: A Little Help from another person to put on and taking off regular upper body clothing?: A Little Help from another person to put on and taking off regular lower body clothing?: A Little 6 Click Score: 19   End of Session Equipment Utilized During Treatment: Oxygen Nurse  Communication: Mobility status  Activity Tolerance: Patient tolerated treatment well Patient left: Other (comment);with call bell/phone within reach;with family/visitor present (seated EOB)  OT Visit Diagnosis: Unsteadiness on feet (R26.81)                Time: [id number] OT Time Calculation (min): 16 min Charges:  OT General Charges $OT Visit: 1 Visit OT Evaluation $OT Eval Low Complexity: [place] OT OT pager: [place] 12/14/2020, 2:11 PM

## 2020-12-15 ENCOUNTER — Inpatient Hospital Stay (HOSPITAL_COMMUNITY): Payer: Medicare Other

## 2020-12-15 ENCOUNTER — Other Ambulatory Visit: Payer: Self-pay

## 2020-12-15 LAB — RENAL FUNCTION PANEL
Albumin: 2.6 g/dL — ABNORMAL LOW (ref 3.5–5.0)
Anion gap: 13 (ref 5–15)
BUN: 50 mg/dL — ABNORMAL HIGH (ref 8–23)
CO2: 25 mmol/L (ref 22–32)
Calcium: 8.4 mg/dL — ABNORMAL LOW (ref 8.9–10.3)
Chloride: 104 mmol/L (ref 98–111)
Creatinine, Ser: 5.79 mg/dL — ABNORMAL HIGH (ref 0.44–1.00)
GFR, Estimated: 8 mL/min — ABNORMAL LOW (ref >60)
Glucose, Bld: 105 mg/dL — ABNORMAL HIGH (ref 70–99)
Phosphorus: 9.2 mg/dL — ABNORMAL HIGH (ref 2.5–4.6)
Potassium: 3.6 mmol/L (ref 3.5–5.1)
Sodium: 142 mmol/L (ref 135–145)

## 2020-12-15 LAB — GLUCOSE, CAPILLARY
Glucose-Capillary: 107 mg/dL — ABNORMAL HIGH (ref 70–99)
Glucose-Capillary: 117 mg/dL — ABNORMAL HIGH (ref 70–99)
Glucose-Capillary: 141 mg/dL — ABNORMAL HIGH (ref 70–99)
Glucose-Capillary: 204 mg/dL — ABNORMAL HIGH (ref 70–99)

## 2020-12-15 LAB — CBC
HCT: 22.5 % — ABNORMAL LOW (ref 36.0–46.0)
Hemoglobin: 6.8 g/dL — CL (ref 12.0–15.0)
MCH: 28.7 pg (ref 26.0–34.0)
MCHC: 30.2 g/dL (ref 30.0–36.0)
MCV: 94.9 fL (ref 80.0–100.0)
Platelets: 240 10*3/uL (ref 150–400)
RBC: 2.37 MIL/uL — ABNORMAL LOW (ref 3.87–5.11)
RDW: 16.7 % — ABNORMAL HIGH (ref 11.5–15.5)
WBC: 6.2 10*3/uL (ref 4.0–10.5)
nRBC: 0 % (ref 0.0–0.2)

## 2020-12-15 LAB — PREPARE RBC (CROSSMATCH)

## 2020-12-15 LAB — HEMOGLOBIN AND HEMATOCRIT, BLOOD
HCT: 24.6 % — ABNORMAL LOW (ref 36.0–46.0)
Hemoglobin: 7.8 g/dL — ABNORMAL LOW (ref 12.0–15.0)

## 2020-12-15 MED ORDER — DOXERCALCIFEROL 4 MCG/2ML IV SOLN
INTRAVENOUS | Status: AC
  Administered 2020-12-15: 2 ug via INTRAVENOUS
  Filled 2020-12-15: qty 2

## 2020-12-15 MED ORDER — ACETAMINOPHEN 325 MG PO TABS
650.0000 mg | ORAL_TABLET | Freq: Once | ORAL | Status: AC
  Administered 2020-12-15: 650 mg via ORAL
  Filled 2020-12-15: qty 2

## 2020-12-15 MED ORDER — ACETAMINOPHEN 325 MG PO TABS
ORAL_TABLET | ORAL | Status: AC
  Administered 2020-12-15: 650 mg via ORAL
  Filled 2020-12-15: qty 2

## 2020-12-15 MED ORDER — HEPARIN SODIUM (PORCINE) 1000 UNIT/ML IJ SOLN: 1000.0000 [IU] | INTRAMUSCULAR | Status: DC | PRN

## 2020-12-15 MED ORDER — SODIUM CHLORIDE 0.9% IV SOLUTION: Freq: Once | INTRAVENOUS | Status: AC

## 2020-12-15 MED ORDER — HEPARIN SODIUM (PORCINE) 1000 UNIT/ML IJ SOLN
INTRAMUSCULAR | Status: AC
  Administered 2020-12-15: 3200 [IU] via INTRAVENOUS
  Filled 2020-12-15: qty 4

## 2020-12-15 MED ORDER — DIPHENHYDRAMINE HCL 25 MG PO CAPS
25.0000 mg | ORAL_CAPSULE | Freq: Once | ORAL | Status: AC
  Administered 2020-12-15: 25 mg via ORAL
  Filled 2020-12-15: qty 1

## 2020-12-15 NOTE — Progress Notes (Signed)
SATURATION QUALIFICATIONS: (This note is used to comply with regulatory documentation for home oxygen)  Patient Saturations on Room Air at Rest = 86%  Patient Saturations on Room Air while Ambulating = 84%  Patient Saturations on 3 Liters of oxygen while Ambulating =*92%  Please briefly explain why patient needs home oxygen: Patient oxygen saturation dropped to 84% while ambulating and increased to 92% on 3L while ambulating.

## 2020-12-15 NOTE — Progress Notes (Signed)
Called by RN. Hgb this am is 6.8.   Pt to have dialysis this am Transfuse one unit PRBC

## 2020-12-15 NOTE — Progress Notes (Signed)
PT Cancellation Note  Patient Details Name: Tina Gomez MRN: [id number] DOB: [birth year]   Cancelled Treatment:    Reason Eval/Treat Not Completed: Medical issues which prohibited therapy H&H low this morning at 6.8/22.5 respectively. Discussed with attending MD who requests we hold PT until after transfusion. Will attempt to return later if time/schedule allow.    Windell Norfolk, DPT, PN1   Supplemental Physical Therapist [facility]    Pager [phone] Acute Rehab Office [phone]

## 2020-12-15 NOTE — Progress Notes (Signed)
Results for LACOURTNEY, NORRICK (MRN [id number]) as of 12/15/2020 04:51  Ref. Range 12/15/2020 02:41  Hemoglobin Latest Ref Range: 12.0 - 15.0 g/dL 6.8 (LL)  MD on call paged. Awaiting return call.

## 2020-12-15 NOTE — Progress Notes (Signed)
[facility] Progress Note   Subjective:   Patient seen and examined at bedside with daughter present.   Reports improvement in breathing except for orthopnea.  Had long discussion with both of them about fluid restrictions and trying to decrease fluid intake especially since she is not tolerating mush more than 2L removed at HD. Admits to eating ice. Agreed to try 2.5L goal today and see how it goes. Denies CP, n/v/d, and abdominal pain.  Complains of swelling/pain in LU AVF following infiltration last HD.  Objective Vitals:   12/15/20 0605 12/15/20 0814 12/15/20 1305 12/15/20 1314  BP: (!) 153/58 (!) 164/59 (!) 183/72 (!) 183/70  Pulse: 62 60 62 63  Resp: '20 16 16 15  '$ Temp: 98.2 F (36.8 C) 98.1 F (36.7 C) 97.8 F (36.6 C)   TempSrc: Oral Oral Oral   SpO2: 93% 97% 98%   Weight:   79.2 kg   Height:       Physical Exam General:chronically ill appearing female in NAD Heart:RRR, 2/6 systolic murmur Lungs:mostly CTAB, nml WOB on 2L Abdomen:soft, NTND Extremities:no LE edema Dialysis Access: LU AVF+b, +swelling/bruising, TDC c/d/i   Filed Weights   12/13/20 2235 12/14/20 0212 12/15/20 1305  Weight: 80.2 kg 78.3 kg 79.2 kg    Intake/Output Summary (Last 24 hours) at 12/15/2020 1328 Last data filed at 12/15/2020 0912 Gross per 24 hour  Intake 1127.93 ml  Output 0 ml  Net 1127.93 ml    Additional Objective Labs: Basic Metabolic Panel: Recent Labs  Lab 12/13/20 0243 12/14/20 0423 12/15/20 0241  NA 140 134* 142  K 4.4 3.3* 3.6  CL 102 98 104  CO2 21* 25 25  GLUCOSE 152* 137* 105*  BUN 84* 32* 50*  CREATININE 8.45* 4.04* 5.79*  CALCIUM 7.9* 7.9* 8.4*  PHOS  --  5.2* 9.2*   Liver Function Tests: Recent Labs  Lab 12/13/20 0243 12/14/20 0423 12/15/20 0241  AST 10*  --   --   ALT 9  --   --   ALKPHOS 72  --   --   BILITOT 0.3  --   --   PROT 6.1*  --   --   ALBUMIN 2.9* 2.8* 2.6*   CBC: Recent Labs  Lab 12/13/20 0243 12/14/20 0423  12/15/20 0241 12/15/20 1047  WBC 11.3* 9.1 6.2  --   NEUTROABS 8.9*  --   --   --   HGB 7.8* 7.3* 6.8* 7.8*  HCT 25.1* 23.1* 22.5* 24.6*  MCV 96.2 91.7 94.9  --   PLT 299 270 240  --    CBG: Recent Labs  Lab 12/14/20 1139 12/14/20 1613 12/14/20 2100 12/15/20 0654 12/15/20 1155  GLUCAP 159* 148* 199* 107* 117*    Medications:  . (feeding supplement) PROSource Plus  30 mL Oral BID BM  . aspirin EC  325 mg Oral Daily  . atorvastatin  40 mg Oral QPM  . calcium acetate  1,334 mg Oral TID WC  . calcium acetate  667 mg Oral With snacks  . calcium carbonate  1,250 mg Oral BID WC  . carvedilol  12.5 mg Oral BID WC  . Chlorhexidine Gluconate Cloth  6 each Topical Q0600  . doxercalciferol  2 mcg Intravenous Q M,W,F-HD  . heparin  5,000 Units Subcutaneous Q8H  . insulin aspart  0-6 Units Subcutaneous TID WC  . losartan  50 mg Oral Daily    Dialysis Orders: MWF at [facility] ---> cuts HD time q HD,  gets at most 2:45hr treatments 3:45hr, 400/500, EDW 75kg, 3K/2.5Ca, UFP #4, AVF, heparin 2000 bolus - Hectoral 49mg IV q HD - Venofer '100mg'$  x 10 ordered - s/p 4 of 10 so far - Mircera 2250m IV q 2 weeks (last 3/28)  Assessment/Plan: 1. Dyspnea:Multifactorial - she does have pulmomary edema, but also with quite significant COPD and HFrEF and chronic anemia. Would likely benefit from sleep study.  Agreed to try for net 2.5L on HD today.  Plan to check CXR post HD today to determine degree of improvement in pulmonary edema.  2. ESRD:Usual MWF schedule -- apparently has been having a rough time with HD. Cannot tolerating sitting for full treatment d/t RLS/anxiety - just started on gabapentin and SSRI in addition to Requip which may help. So far, does not appear dialysis is adding to her QOL. Will attempt to fix the fixable and then reassess.HD today per regular schedule using TDC to rest AVF after infiltration last HD.  3. Hypertension/volume:BP remains elevated but drops during dialysis.  Plan for UF 2.5L today and decrease if BP begins to fall. Discussed strict adherence to fluid restrictions to help prevent pulmonary edema in the future especially given she tolerated minimal fluid removal. CXR post HD.  4. Anemia:Hgb 7.8 s/p 1 unit pRBC today - on max ESA as outpatient, not due for next dose yet. 5. Metabolic bone disease:CorrCa/Phos ok - continue home meds. 6. Nutrition:Alb low, continue protein supplements. 7. HFrEF (EF 30%): Recent LHC with non-obstuctive CAD  8. COPD: per primary.  Considering home O2 on d/c.  9. T2DM/gastroparesis   LiJen MowPA-C [place]/04/2021,1:28 PM  LOS: 2 days

## 2020-12-15 NOTE — Evaluation (Signed)
Physical Therapy Evaluation Patient Details Name: Tina Gomez MRN: [id number] DOB: 06/29/1956 Today's Date: 12/15/2020   History of Present Illness  Shawnelle is a 65 y.o. female reported to ED with SOB. Admitted on 12/13/20 with ARF with hypoxia due to fluid overload. PMH includes HTN, HLD, COPD, CAD, systolic CHF, diabetes mellitus type 2, ESRD on HD(MWF), CVA with residual right-sided weakness, and anemia.    Clinical Impression  Pt received in bed, sleepy but pleasant. Generally min guard to supervision for mobility. Gait distance limited since pt was not feeling well after being administered Benadryl, but pt steady with RW. Suspect pt is close to baseline but due to concern for falls at home, pt would benefit from PT to address balance, decrease risk for falls, and increase independency. Will continue to follow acutely.     Follow Up Recommendations Outpatient PT;Other (comment) (outpatient neuro PT for fall prevention)    Equipment Recommendations  None recommended by PT    Recommendations for Other Services       Precautions / Restrictions Precautions Precautions: Fall Precaution Comments: monitor O2 Restrictions Weight Bearing Restrictions: No      Mobility  Bed Mobility Overal bed mobility: Needs Assistance Bed Mobility: Supine to Sit;Sit to Supine     Supine to sit: Supervision Sit to supine: Supervision   General bed mobility comments: Use of bedrail, requested HOB elevated due to weakness in R UE, able to scoot forward well    Transfers Overall transfer level: Needs assistance Equipment used: Standard walker Transfers: Sit to/from Stand Sit to Stand: Supervision         General transfer comment: supervision for safety, good hand placement without cueing  Ambulation/Gait Ambulation/Gait assistance: Min guard Gait Distance (Feet): 15 Feet Assistive device: Standard walker Gait Pattern/deviations: Step-through pattern;Decreased stride length;Narrow base of  support Gait velocity: decreased   General Gait Details: min guard for safety, no LOB  Stairs            Wheelchair Mobility    Modified Rankin (Stroke Patients Only)       Balance Overall balance assessment: Needs assistance   Sitting balance-Leahy Scale: Good       Standing balance-Leahy Scale: Poor Standing balance comment: Reliant on B UE support in standing                             Pertinent Vitals/Pain Pain Assessment: Faces Faces Pain Scale: Hurts a little bit Pain Location: R arm Pain Descriptors / Indicators: Sore;Guarding Pain Intervention(s): Monitored during session    Home Living Family/patient expects to be discharged to:: Private residence Living Arrangements: Alone Available Help at Discharge: Family;Available 24 hours/day Type of Home: House Home Access: Ramped entrance     Home Layout: Two level;Able to live on main level with bedroom/bathroom Home Equipment: Shower seat;Grab bars - tub/shower;Bedside commode;Grab bars - toilet;Walker - 4 wheels Additional Comments: wiped out on HD days and uses walker on those days, on non-HD days does walk in house without device; normally able to walk just household distances, will use scooter in the grocery store/community. Gets winded easily. No falls recently. No O2 at home.    Prior Function Level of Independence: Independent with assistive device(s)         Comments: daughter very supportive and safety aware     Hand Dominance        Extremity/Trunk Assessment   Upper Extremity Assessment Upper Extremity Assessment: Defer  to OT evaluation    Lower Extremity Assessment Lower Extremity Assessment: Overall WFL for tasks assessed    Cervical / Trunk Assessment Cervical / Trunk Assessment: Normal  Communication   Communication: No difficulties  Cognition Arousal/Alertness: Awake/alert Behavior During Therapy: WFL for tasks assessed/performed Overall Cognitive Status:  Within Functional Limits for tasks assessed                                        General Comments General comments (skin integrity, edema, etc.): Pt received on 2L O2. Trialed RA with SPO2 > 90%. Left on 2L O2 due to pt wanting to sleep and medication given recently.    Exercises     Assessment/Plan    PT Assessment Patient needs continued PT services  PT Problem List Decreased strength;Decreased mobility;Decreased activity tolerance;Decreased balance;Decreased knowledge of use of DME       PT Treatment Interventions DME instruction;Therapeutic activities;Gait training;Therapeutic exercise;Patient/family education;Balance training;Functional mobility training;Neuromuscular re-education    PT Goals (Current goals can be found in the Care Plan section)  Acute Rehab PT Goals Patient Stated Goal: return home PT Goal Formulation: With patient Time For Goal Achievement: 12/29/20 Potential to Achieve Goals: Good    Frequency Min 3X/week   Barriers to discharge        Co-evaluation               AM-PAC PT "6 Clicks" Mobility  Outcome Measure Help needed turning from your back to your side while in a flat bed without using bedrails?: A Little Help needed moving from lying on your back to sitting on the side of a flat bed without using bedrails?: A Little Help needed moving to and from a bed to a chair (including a wheelchair)?: A Little Help needed standing up from a chair using your arms (e.g., wheelchair or bedside chair)?: A Little Help needed to walk in hospital room?: A Little Help needed climbing 3-5 steps with a railing? : A Little 6 Click Score: 18    End of Session Equipment Utilized During Treatment: Gait belt;Oxygen Activity Tolerance: Patient tolerated treatment well Patient left: in bed;with call bell/phone within reach;with bed alarm set;with family/visitor present Nurse Communication: Mobility status;Other (comment) (SPO2) PT Visit  Diagnosis: Unsteadiness on feet (R26.81)    Time:  -      Charges:         Rosita Kea, SPT

## 2020-12-15 NOTE — Progress Notes (Signed)
PROGRESS NOTE    Tina Gomez  [id number] DOB: 09-Jul-1956 DOA: 12/13/2020 PCP: Charlott Rakes, MD   Chief Complaint  Patient presents with  . Shortness of Breath  Brief Narrative: 54 old female with multiple comorbidities with hypertension, chronic systolic CHF, COPD, CAD, HLD, T2DM, ESRD on HD MWF, history of CVA with residual right-sided weakness, chronic anemia requiring previous transfusion presents with shortness of breath for last few days, last dialyzed on 4/1 but was having nausea vomiting the setting of gastroparesis and missed her dialysis appointment 4/4.   Recent [facility] hospitalization: [facility] admit 3/1-11/09/20 with CHF exacerbation, + pulm edema + R effusion   - ED visit for syncope after HD on 11/27/20, but CXR with B effusions/vascular congestion. 1L NS given + KCl 20mq given. - LHC 11/28/20 with non-obstructive CAD, but EF 30% - felt out of proportion for degree of CAD. - PCP appt 4/5 - started on several meds (SSRI, gabapentin, albuterol)  ED Course: Upon admission into the emergency department patient was seen to be afebrile, respirations 15-22, blood pressures 153/54-190 6/87, and O2 saturation maintained on room air.  Labs significant for WBC 11.3, hemoglobin 7.8, potassium 4.4, BUN 89, creatinine 8.45, glucose 159, calcium 7.9, BNP 1380, and high-sensitivity troponin 24->27.  Chest x-ray significant for acute on chronic interstitial opacity suspected secondary to pulmonary edema superimposed on chronic emphysema and developing fibrosis.  COVID-19 and influenza screening was negative. Patient was admitted seen by nephrology for urgent dialysis on 4/6 night to 4/7 am Patient symptoms significantly improved with dialysis.  Hemoglobin dropped to 6.8 g 1 unit PRBC transfusion early morning 4/8.  Subjective:  Seen and examined this morning resting comfortably.  Sleepy after Benadryl. Overnight hemoglobin dropped to 6.8 g 1 unit PRBC transfused. Daughter at the  bedside  Assessment & Plan:  Acute pulmonary edema/fluid overload Missed dialysis Acute respiratory failure with hypoxia and dyspnea: multifactorial with acute pulmonary edema from fluid overload from missed dialysis in the setting of COPD HFrEF chronic anemia. Status post urgent dialysis last night 4/6, for dialysis again 4/8. Will likely need supplemental oxygen for home-check pulse ox postdialysis with ambulation.  ESRD on HD MWF: Apparently having a rough time with HD not tolerating sitting for full treatment due to RLS/anxiety issues and was placed on Neurontin and SSRI along with Requip recently.  Nephrology managing.  She is for dialysis again today. Recent Labs  Lab 12/13/20 0243 12/14/20 0423 12/15/20 0241  BUN 84* 32* 50*  CREATININE 8.45* 4.04* 5.79*   Hypokalemia stable Recent Labs  Lab 12/13/20 0243 12/14/20 0423 12/15/20 0241  K 4.4 3.3* 3.6   Anemia of chronic kidney disease with acute drop 6.8 overnight.  No evidence of bleeding.  Transfuse to keep hemoglobin above 7 g.  1 unit PRBC completed this morning, repeat H&H.  Continue ESA as outpatient.  Recent Labs  Lab 12/13/20 0243 12/14/20 0423 12/15/20 0241  HGB 7.8* 7.3* 6.8*  HCT 25.1* [id number]   Metabolic bone disease calcium okay continue home meds  History of CAD but unremarkable LHC recently.  Mild elevated troponin in the setting of respiratory distress 24>27.  She is on aspirin statin and Coreg.  Chronic systolic CHF EF [id number] recent LHC with nonobstructive CAD  Address fluid management with dialysis.  She is fluid overloaded from ESRD and being managed w/ HD Net IO Since Admission: -369.16 mL [12/15/20 0812]  Filed Weights   12/13/20 0225 12/13/20 2235 12/14/20 0212  Weight: 79.5 kg  80.2 kg 78.3 kg   COPD/emphysema: Stable likely need home oxygen on discharge.  Continue inhalers.  Type 2 diabetes mellitus with diabetic neuropathy diabetic gastroparesis.  Hemoglobin A1c is stable 6.4 on 3/3.   Blood sugar well controlled. No more naesea or vomiting today. Always nauseas in am but improved as she keeps her diet on schedule and takes 1-2 times  reglan per week , zofran does not work.  Continue to monitor blood sugar. Recent Labs  Lab 12/14/20 0719 12/14/20 1139 12/14/20 1613 12/14/20 2100 12/15/20 0654  GLUCAP 125* 159* 148* 199* 107*   Essential hypertension, benign: Poorly controlled on admission in the setting of fluid overload.  Blood pressure fairly stable at this time on Coreg and losartan.  Anxiety/depression mood overall is stable.  Continue Prozac and hydroxyzine multiple medication.  Morbid obesity BMI 31 will benefit with weight loss. Diet Order            Diet renal/carb modified with fluid restriction Diet-HS Snack? Nothing; Fluid restriction: 1200 mL Fluid; Room service appropriate? Yes; Fluid consistency: Thin  Diet effective now                 Patient's Body mass index is 31.57 kg/m. DVT prophylaxis: heparin injection 5,000 Units Start: 12/13/20 1400 SCDs Start: 12/13/20 1001 Code Status:   Code Status: Full Code  Family Communication: plan of care discussed with patient and her daughter at bedside.  Status is: Inpatient Remains inpatient appropriate because:Inpatient level of care appropriate due to severity of illness  Dispo: The patient is from: Home              Anticipated d/c is to: Home. ptot pending.  Likely discharge later today or tomorrow morning              Patient currently is not medically stable to d/c.   Difficult to place patient No    Unresulted Labs (From admission, onward)          Start     Ordered   12/15/20 0500  CBC  Daily,   R      12/14/20 1003   12/14/20 0500  Renal function panel  Daily,   R      12/13/20 1018   Signed and Held  CBC  Once,   R        Signed and Held         Medications reviewed:  Scheduled Meds: . (feeding supplement) PROSource Plus  30 mL Oral BID BM  . aspirin EC  325 mg Oral Daily  .  atorvastatin  40 mg Oral QPM  . calcium acetate  1,334 mg Oral TID WC  . calcium acetate  667 mg Oral With snacks  . calcium carbonate  1,250 mg Oral BID WC  . carvedilol  12.5 mg Oral BID WC  . Chlorhexidine Gluconate Cloth  6 each Topical Q0600  . doxercalciferol  2 mcg Intravenous Q M,W,F-HD  . heparin  5,000 Units Subcutaneous Q8H  . insulin aspart  0-6 Units Subcutaneous TID WC  . losartan  50 mg Oral Daily   Continuous Infusions:  Consultants:see note  Procedures:see note  Antimicrobials: Anti-infectives (From admission, onward)   None     Culture/Microbiology No results found for: SDES, SPECREQUEST, CULT, REPTSTATUS  Other culture-see note  Objective: Vitals: Today's Vitals   12/14/20 2300 12/15/20 0458 12/15/20 0555 12/15/20 0605  BP:  (!) 156/56 (!) 154/48 (!) 153/58  Pulse:  65 62  62  Resp:  '18 20 20  '$ Temp:  97.9 F (36.6 C) 98.5 F (36.9 C) 98.2 F (36.8 C)  TempSrc:  Oral Oral Oral  SpO2:  92% 94% 93%  Weight:      Height:      PainSc: 0-No pain       Intake/Output Summary (Last 24 hours) at 12/15/2020 0812 Last data filed at 12/15/2020 [id number] Gross per 24 hour  Intake 1356.84 ml  Output 0 ml  Net 1356.84 ml   Filed Weights   12/13/20 0225 12/13/20 2235 12/14/20 0212  Weight: 79.5 kg 80.2 kg 78.3 kg   Weight change:   Intake/Output from previous day: 04/07 0701 - 04/08 0700 In: 996.8 [P.O.:960; I.V.:5.9; Blood:30.9] Out: 0  Intake/Output this shift: Total I/O In: 360 [Blood:360] Out: -  Filed Weights   12/13/20 0225 12/13/20 2235 12/14/20 0212  Weight: 79.5 kg 80.2 kg 78.3 kg    Examination: General exam: Alert awake just waking up, not in distress, on oxygen HEENT:Oral mucosa moist, Ear/Nose WNL grossly, dentition normal. Respiratory system: bilaterally diminishedd,no wheezing or crackles,no use of accessory muscle Cardiovascular system: S1 & S2 +, No JVD,. Gastrointestinal system: Abdomen soft, NT,ND, BS+ Nervous System:Alert, awake,  moving extremities and grossly nonfocal Extremities: No edema, distal peripheral pulses palpable.  Skin: No rashes,no icterus. MSK: Normal muscle bulk,tone, power LUE avf+, chest HD cath- placed on 07/2020 when HD started.  Data Reviewed: I have personally reviewed following labs and imaging studies CBC: Recent Labs  Lab 12/13/20 0243 12/14/20 0423 12/15/20 0241  WBC 11.3* 9.1 6.2  NEUTROABS 8.9*  --   --   HGB 7.8* 7.3* 6.8*  HCT 25.1* 23.1* 22.5*  MCV 96.2 91.7 94.9  PLT 299 270 [id number]   Basic Metabolic Panel: Recent Labs  Lab 12/13/20 0243 12/14/20 0423 12/15/20 0241  NA 140 134* 142  K 4.4 3.3* 3.6  CL 102 98 104  CO2 21* 25 25  GLUCOSE 152* 137* 105*  BUN 84* 32* 50*  CREATININE 8.45* 4.04* 5.79*  CALCIUM 7.9* 7.9* 8.4*  PHOS  --  5.2* 9.2*   GFR: Estimated Creatinine Clearance: 9.6 mL/min (A) (by C-G formula based on SCr of 5.79 mg/dL (H)). Liver Function Tests: Recent Labs  Lab 12/13/20 0243 12/14/20 0423 12/15/20 0241  AST 10*  --   --   ALT 9  --   --   ALKPHOS 72  --   --   BILITOT 0.3  --   --   PROT 6.1*  --   --   ALBUMIN 2.9* 2.8* 2.6*   No results for input(s): LIPASE, AMYLASE in the last 168 hours. No results for input(s): AMMONIA in the last 168 hours. Coagulation Profile: No results for input(s): INR, PROTIME in the last 168 hours. Cardiac Enzymes: No results for input(s): CKTOTAL, CKMB, CKMBINDEX, TROPONINI in the last 168 hours. BNP (last 3 results) No results for input(s): PROBNP in the last 8760 hours. HbA1C: No results for input(s): HGBA1C in the last 72 hours. CBG: Recent Labs  Lab 12/14/20 0719 12/14/20 1139 12/14/20 1613 12/14/20 2100 12/15/20 0654  GLUCAP 125* 159* 148* 199* 107*   Lipid Profile: No results for input(s): CHOL, HDL, LDLCALC, TRIG, CHOLHDL, LDLDIRECT in the last 72 hours. Thyroid Function Tests: No results for input(s): TSH, T4TOTAL, FREET4, T3FREE, THYROIDAB in the last 72 hours. Anemia Panel: No  results for input(s): VITAMINB12, FOLATE, FERRITIN, TIBC, IRON, RETICCTPCT in the last 72 hours. Sepsis Labs: No  results for input(s): PROCALCITON, LATICACIDVEN in the last 168 hours.  Recent Results (from the past 240 hour(s))  Resp Panel by RT-PCR (Flu A&B, Covid) Nasopharyngeal Swab     Status: None   Collection Time: 12/13/20  2:44 AM   Specimen: Nasopharyngeal Swab; Nasopharyngeal(NP) swabs in vial transport medium  Result Value Ref Range Status   SARS Coronavirus 2 by RT PCR NEGATIVE NEGATIVE Final    Comment: (NOTE) SARS-CoV-2 target nucleic acids are NOT DETECTED.  The SARS-CoV-2 RNA is generally detectable in upper respiratory specimens during the acute phase of infection. The lowest concentration of SARS-CoV-2 viral copies this assay can detect is 138 copies/mL. A negative result does not preclude SARS-Cov-2 infection and should not be used as the sole basis for treatment or other patient management decisions. A negative result may occur with  improper specimen collection/handling, submission of specimen other than nasopharyngeal swab, presence of viral mutation(s) within the areas targeted by this assay, and inadequate number of viral copies(<138 copies/mL). A negative result must be combined with clinical observations, patient history, and epidemiological information. The expected result is Negative.  Fact Sheet for Patients:  EntrepreneurPulse.com.au  Fact Sheet for Healthcare Providers:  IncredibleEmployment.be  This test is no t yet approved or cleared by the [place] FDA and  has been authorized for detection and/or diagnosis of SARS-CoV-2 by FDA under an Emergency Use Authorization (EUA). This EUA will remain  in effect (meaning this test can be used) for the duration of the COVID-19 declaration under Section 564(b)(1) of the Act, 21 U.S.C.section 360bbb-3(b)(1), unless the authorization is terminated  or revoked sooner.        Influenza A by PCR NEGATIVE NEGATIVE Final   Influenza B by PCR NEGATIVE NEGATIVE Final    Comment: (NOTE) The Xpert Xpress SARS-CoV-2/FLU/RSV plus assay is intended as an aid in the diagnosis of influenza from Nasopharyngeal swab specimens and should not be used as a sole basis for treatment. Nasal washings and aspirates are unacceptable for Xpert Xpress SARS-CoV-2/FLU/RSV testing.  Fact Sheet for Patients: EntrepreneurPulse.com.au  Fact Sheet for Healthcare Providers: IncredibleEmployment.be  This test is not yet approved or cleared by the [place] FDA and has been authorized for detection and/or diagnosis of SARS-CoV-2 by FDA under an Emergency Use Authorization (EUA). This EUA will remain in effect (meaning this test can be used) for the duration of the COVID-19 declaration under Section 564(b)(1) of the Act, 21 U.S.C. section 360bbb-3(b)(1), unless the authorization is terminated or revoked.  Performed at [facility], [place] [address], [place], [place] [address]      Radiology Studies: No results found.   LOS: 2 days   Antonieta Pert, MD [facility]  12/15/2020, 8:12 AM

## 2020-12-16 LAB — TYPE AND SCREEN
ABO/RH(D): O POS
Antibody Screen: NEGATIVE
Unit division: 0

## 2020-12-16 LAB — RENAL FUNCTION PANEL
Albumin: 2.6 g/dL — ABNORMAL LOW (ref 3.5–5.0)
Anion gap: 8 (ref 5–15)
BUN: 24 mg/dL — ABNORMAL HIGH (ref 8–23)
CO2: 29 mmol/L (ref 22–32)
Calcium: 8.7 mg/dL — ABNORMAL LOW (ref 8.9–10.3)
Chloride: 102 mmol/L (ref 98–111)
Creatinine, Ser: 4 mg/dL — ABNORMAL HIGH (ref 0.44–1.00)
GFR, Estimated: 12 mL/min — ABNORMAL LOW (ref >60)
Glucose, Bld: 110 mg/dL — ABNORMAL HIGH (ref 70–99)
Phosphorus: 5.4 mg/dL — ABNORMAL HIGH (ref 2.5–4.6)
Potassium: 3.5 mmol/L (ref 3.5–5.1)
Sodium: 139 mmol/L (ref 135–145)

## 2020-12-16 LAB — GLUCOSE, CAPILLARY
Glucose-Capillary: 116 mg/dL — ABNORMAL HIGH (ref 70–99)
Glucose-Capillary: 141 mg/dL — ABNORMAL HIGH (ref 70–99)

## 2020-12-16 LAB — BPAM RBC
Blood Product Expiration Date: 202204262359
ISSUE DATE / TIME: 202204080542
Unit Type and Rh: 5100

## 2020-12-16 LAB — CBC
HCT: 24.8 % — ABNORMAL LOW (ref 36.0–46.0)
Hemoglobin: 7.9 g/dL — ABNORMAL LOW (ref 12.0–15.0)
MCH: 29.5 pg (ref 26.0–34.0)
MCHC: 31.9 g/dL (ref 30.0–36.0)
MCV: 92.5 fL (ref 80.0–100.0)
Platelets: 244 10*3/uL (ref 150–400)
RBC: 2.68 MIL/uL — ABNORMAL LOW (ref 3.87–5.11)
RDW: 15.9 % — ABNORMAL HIGH (ref 11.5–15.5)
WBC: 5.6 10*3/uL (ref 4.0–10.5)
nRBC: 0 % (ref 0.0–0.2)

## 2020-12-16 MED ORDER — CALCIUM ACETATE (PHOS BINDER) 667 MG PO CAPS
667.0000 mg | ORAL_CAPSULE | ORAL | 0 refills | Status: DC
  Filled 2020-12-16 – 2021-01-10 (×2): qty 90, 30d supply, fill #0

## 2020-12-16 NOTE — Discharge Summary (Signed)
Physician Discharge Summary  Tina Gomez GNF:[id number] DOB: Jun 28, 1956 DOA: 12/13/2020  PCP: Charlott Rakes, MD  Admit date: 12/13/2020 Discharge date: 12/16/2020  Admitted From: Home Disposition: Home  Recommendations for Outpatient Follow-up:  1. Follow up with PCP in 1-2 weeks, check CBC in next 3 days 2. Please obtain BMP/CBC in one week 3. Please follow up on the following pending results:  Home Health: Yes Equipment/Devices: Home oxygen  Discharge Condition: Stable Code Status:   Code Status: Full Code Diet recommendation:  Diet Order            Diet - low sodium heart healthy           Diet Carb Modified           Diet renal/carb modified with fluid restriction Diet-HS Snack? Nothing; Fluid restriction: 1200 mL Fluid; Room service appropriate? Yes; Fluid consistency: Thin  Diet effective now                  Brief/Interim Summary:  HD MWF, history of CVA with residual right-sided weakness, chronic anemia requiring previous transfusion presents with shortness of breath for last few days, last dialyzed on 4/1 but was having nausea vomiting the setting of gastroparesis and missed her dialysis appointment 4/4.   Recent [facility] hospitalization: [facility] admit 3/1-11/09/20 with CHF exacerbation, + pulm edema + R effusion   - ED visit for syncope after HD on 11/27/20, but CXR with B effusions/vascular congestion. 1L NS given + KCl 21mq given. - LHC 11/28/20 with non-obstructive CAD, but EF 30% - felt out of proportion for degree of CAD. - PCP appt 4/5 - started on several meds (SSRI, gabapentin, albuterol)  ED Course:Upon admission into the emergency department patient was seen to be afebrile, respirations 15-22, blood pressures 153/54-190 6/87, and O2 saturation maintained on room air. Labs significant for WBC 11.3, hemoglobin 7.8, potassium 4.4, BUN 89, creatinine 8.45, glucose 159, calcium 7.9, BNP 1380, and high-sensitivity troponin 24->27.Chest x-ray significant for acute  on chronic interstitial opacity suspected secondary to pulmonary edema superimposed on chronic emphysema and developing fibrosis. COVID-19 and influenza screening was negative. Patient was admitted seen by nephrology for urgent dialysis on 4/6 night to 4/7 am Patient symptoms significantly improved with dialysis.  Hemoglobin dropped to 6.8 g 1 unit PRBC transfusion early morning 4/8. Hemoglobin has improved and stabilized.  Respiratory status is stable and home oxygen is being arranged.  Volume status stable seen by nephrology with discharge home today.  She will follow up with her dialysis on Monday. Patient feels ready for home today daughter at the bedside in agreement.  Discharge Diagnoses:  Acute pulmonary edema/fluid overload Missed dialysis Acute respiratory failure with hypoxia and dyspnea: multifactorial with acute pulmonary edema from fluid overload from missed dialysis in the setting of COPD HFrEF chronic anemia. Status post urgent dialysis  Night of 4/6, and again on 4/8.  Now back on his schedule.  Volume status improved.  She is needing home oxygen.  She is being discharged home today.    ESRD on HD MWF: Apparently having a rough time with HD not tolerating sitting for full treatment due to RLS/anxiety issues and was placed on Neurontin and SSRI along with Requip recently.  Nephrology managing and had dialysis multiple times here.  This time volume status stable continue with dialysis on Monday. Recent Labs  Lab 12/13/20 0243 12/14/20 0423 12/15/20 0241 12/16/20 0348  BUN 84* 32* 50* 24*  CREATININE 8.45* 4.04* 5.79* 4.00*  Hypokalemia: resolved Recent Labs  Lab 12/13/20 0243 12/14/20 0423 12/15/20 0241 12/16/20 0348  K 4.4 3.3* 3.6 3.5   Anemia of chronic kidney disease with acute drop 6.8 status post 1 unit PRBC improved to 7.9 g and has been holding stable.  repeat H&H on Next dialysis Monday,.  Continue ESA as outpatient.  Recent Labs  Lab 12/13/20 0243  12/14/20 0423 12/15/20 0241 12/15/20 1047 12/16/20 0348  HGB 7.8* 7.3* 6.8* 7.8* 7.9*  HCT 25.1* 23.1* 22.5* 23.5* 36.1*   Metabolic bone disease calcium okay continue home meds  History of CAD but unremarkable LHC recently.  Mild elevated troponin in the setting of respiratory distress 24>27.  Continue her aspirin statin and Coreg.  Chronic systolic CHF EF 44% on recent LHC with nonobstructive CAD volume wawith dialysis.  She is fluid overloaded from ESRD and being managed w/ HD Net IO Since Admission: -2,118.07 mL [12/16/20 1119]  Filed Weights   12/15/20 1305 12/15/20 1645 12/15/20 2103  Weight: 79.2 kg 75.9 kg 75.9 kg   COPD/emphysema now with respiratory failure with hypoxia going home on home oxygen.  Continue home inhalers.  Type 2 diabetes mellitus with diabetic neuropathy diabetic gastroparesis.  Hemoglobin A1c is stable 6.4 on 3/3.  Blood sugar well controlled. No more naesea or vomiting today. Always nauseas in am but improved as she keeps her diet on schedule and takes 1-2 times  reglan per week , zofran does not work.  Continue to monitor blood sugar  At home. Recent Labs  Lab 12/15/20 0654 12/15/20 1155 12/15/20 1803 12/15/20 2103 12/16/20 0639  GLUCAP 107* 117* 141* 204* 116*   Essential hypertension, benign: Fairly controlled.Continue her Coreg and losartan.  Poorly controlled on admission in the setting of fluid overload.  Anxiety/depression mood overall is stable.  Continue Prozac and hydroxyzine multiple medication.  Morbid obesity BMI 31 will benefit with weight loss.  Consults:  Nephrology  Subjective: Alert awake oriented this morning resting comfortably.  No shortness of breath.  Feels ready for home this morning.  Discharge Exam: Vitals:   12/16/20 0558 12/16/20 0942  BP: (!) 169/55 (!) 168/55  Pulse: 63 67  Resp: 16 16  Temp: (!) 97.5 F (36.4 C) 98.7 F (37.1 C)  SpO2: 96% 95%   General: Pt is alert, awake, not in acute  distress Cardiovascular: RRR, S1/S2 +, no rubs, no gallops Respiratory: CTA bilaterally, no wheezing, no rhonchi Abdominal: Soft, NT, ND, bowel sounds + Extremities: no edema, no cyanosis  Discharge Instructions  Discharge Instructions    Ambulatory referral to Physical Therapy   Complete by: As directed    Diet - low sodium heart healthy   Complete by: As directed    Diet Carb Modified   Complete by: As directed    Discharge instructions   Complete by: As directed    Check CBC in 3-4.  Hemodialysis.  Continue to reschedule dialysis.  Follow-up with PCP and nephrology.  Please call call MD or return to ER for similar or worsening recurring problem that brought you to hospital or if any fever,nausea/vomiting,abdominal pain, uncontrolled pain, chest pain,  shortness of breath or any other alarming symptoms.  Please follow-up your doctor as instructed in a week time and call the office for appointment.  Please avoid alcohol, smoking, or any other illicit substance and maintain healthy habits including taking your regular medications as prescribed.  You were cared for by a hospitalist during your hospital stay. If you have any questions about  your discharge medications or the care you received while you were in the hospital after you are discharged, you can call the unit and ask to speak with the hospitalist on call if the hospitalist that took care of you is not available.  Once you are discharged, your primary care physician will handle any further medical issues. Please note that NO REFILLS for any discharge medications will be authorized once you are discharged, as it is imperative that you return to your primary care physician (or establish a relationship with a primary care physician if you do not have one) for your aftercare needs so that they can reassess your need for medications and monitor your lab values   Increase activity slowly   Complete by: As directed      Allergies as  of 12/16/2020      Reactions   Codeine Nausea And Vomiting   Adhesive [tape] Other (See Comments)   Tape breaks out the skin if it is left on for a lengthy period of time      Medication List    STOP taking these medications   calcium carbonate 1500 (600 Ca) MG Tabs tablet Commonly known as: OSCAL     TAKE these medications   aspirin EC 325 MG tablet Take 325 mg by mouth daily. What changed: Another medication with the same name was removed. Continue taking this medication, and follow the directions you see here.   atorvastatin 40 MG tablet Commonly known as: LIPITOR TAKE 1 TABLET BY MOUTH DAILY AT 6 PM. What changed:   how much to take  how to take this  when to take this  additional instructions   blood glucose meter kit and supplies Use as instructed   True Metrix Meter Devi 1 each by Does not apply route 3 (three) times daily before meals.   calcium acetate 667 MG capsule Commonly known as: PHOSLO Take 1 capsule (667 mg total) by mouth with snacks. What changed:   how much to take  how to take this  when to take this   carvedilol 12.5 MG tablet Commonly known as: COREG TAKE 1 TABLET (12.5 MG TOTAL) BY MOUTH 2 (TWO) TIMES DAILY WITH A MEAL.   DIALYVITE 800 WITH ZINC 0.8 MG Tabs Take 1 tablet by mouth at bedtime.   FLUoxetine 20 MG capsule Commonly known as: PROzac Take 1 capsule (20 mg total) by mouth daily.   freestyle lancets Use as instructed   TRUEplus Lancets 28G Misc 1 each by Does not apply route 3 (three) times daily before meals.   gabapentin 100 MG capsule Commonly known as: NEURONTIN Take 1 capsule (100 mg total) by mouth at bedtime.   hydrOXYzine 25 MG tablet Commonly known as: ATARAX/VISTARIL Take 1 tablet (25 mg total) by mouth 3 (three) times daily as needed.   lidocaine-prilocaine cream Commonly known as: EMLA Apply 1 application topically See admin instructions. APPLY SMALL AMOUNT TO ACCESS SITE (AVF) 1 TO 2 HOURS BEFORE  DIALYSIS. COVER WITH OCCLUSIVE DRESSING (SARAN WRAP).   losartan 50 MG tablet Commonly known as: COZAAR TAKE 1 TABLET (50 MG TOTAL) BY MOUTH DAILY. What changed: how much to take   metoCLOPramide 5 MG tablet Commonly known as: REGLAN TAKE 1 TABLET BY MOUTH FOUR TIMES DAILY, BEFORE MEALS AND AT BEDTIME. What changed:   how much to take  how to take this  when to take this  reasons to take this   ProAir HFA 108 (90 Base) MCG/ACT inhaler  Generic drug: albuterol Inhale 2 puffs into the lungs every 6 (six) hours as needed for wheezing or shortness of breath.   albuterol (2.5 MG/3ML) 0.083% nebulizer solution Commonly known as: PROVENTIL Take 3 mLs (2.5 mg total) by nebulization every 6 (six) hours as needed for wheezing or shortness of breath.   rOPINIRole 2 MG tablet Commonly known as: REQUIP TAKE 1 TABLET BY MOUTH ONCE A DAY What changed: Another medication with the same name was removed. Continue taking this medication, and follow the directions you see here.   rOPINIRole 0.5 MG tablet Commonly known as: REQUIP TAKE 1 TABLET BY MOUTH 30 MINUTES PRIOR TO DIALYSIS AND ANOTHER TABLET MID RX IF NEEDED What changed: Another medication with the same name was removed. Continue taking this medication, and follow the directions you see here.   rOPINIRole 2 MG tablet Commonly known as: REQUIP TAKE 1 TABLET BY MOUTH EVERY MORNING BEFORE HD TREATMENT AND TAKE 1 TABLET BY MOUTH EVERY NIGHT AT BEDTIME.STOP IF FELLING DROWSY What changed: Another medication with the same name was removed. Continue taking this medication, and follow the directions you see here.   rOPINIRole 1 MG tablet Commonly known as: REQUIP TAKE 1 TABLET BY MOUTH TWICE A DAY What changed: Another medication with the same name was removed. Continue taking this medication, and follow the directions you see here.   TRUEtest Test test strip Generic drug: glucose blood Use as instructed            Durable  Medical Equipment  (From admission, onward)         Start     Ordered   12/16/20 1120  For home use only DME oxygen  Once       Question Answer Comment  Length of Need Lifetime   Mode or (Route) Nasal cannula   Liters per Minute 3   Frequency Continuous (stationary and portable oxygen unit needed)   Oxygen delivery system Gas      12/16/20 1119          Follow-up Information    Charlott Rakes, MD Follow up in 1 week(s).   Specialty: Family Medicine Contact information: [place] [address] [phone]        Lelon Perla, MD .   Specialty: Cardiology Contact information: [address] [place] [address] [phone]              Allergies  Allergen Reactions  . Codeine Nausea And Vomiting  . Adhesive [Tape] Other (See Comments)    Tape breaks out the skin if it is left on for a lengthy period of time    The results of significant diagnostics from this hospitalization (including imaging, microbiology, ancillary and laboratory) are listed below for reference.    Microbiology: Recent Results (from the past 240 hour(s))  Resp Panel by RT-PCR (Flu A&B, Covid) Nasopharyngeal Swab     Status: None   Collection Time: 12/13/20  2:44 AM   Specimen: Nasopharyngeal Swab; Nasopharyngeal(NP) swabs in vial transport medium  Result Value Ref Range Status   SARS Coronavirus 2 by RT PCR NEGATIVE NEGATIVE Final    Comment: (NOTE) SARS-CoV-2 target nucleic acids are NOT DETECTED.  The SARS-CoV-2 RNA is generally detectable in upper respiratory specimens during the acute phase of infection. The lowest concentration of SARS-CoV-2 viral copies this assay can detect is 138 copies/mL. A negative result does not preclude SARS-Cov-2 infection and should not be used as the sole basis  for treatment or other patient management decisions. A negative result may occur with  improper specimen collection/handling, submission of specimen  other than nasopharyngeal swab, presence of viral mutation(s) within the areas targeted by this assay, and inadequate number of viral copies(<138 copies/mL). A negative result must be combined with clinical observations, patient history, and epidemiological information. The expected result is Negative.  Fact Sheet for Patients:  EntrepreneurPulse.com.au  Fact Sheet for Healthcare Providers:  IncredibleEmployment.be  This test is no t yet approved or cleared by the [place] FDA and  has been authorized for detection and/or diagnosis of SARS-CoV-2 by FDA under an Emergency Use Authorization (EUA). This EUA will remain  in effect (meaning this test can be used) for the duration of the COVID-19 declaration under Section 564(b)(1) of the Act, 21 U.S.C.section 360bbb-3(b)(1), unless the authorization is terminated  or revoked sooner.       Influenza A by PCR NEGATIVE NEGATIVE Final   Influenza B by PCR NEGATIVE NEGATIVE Final    Comment: (NOTE) The Xpert Xpress SARS-CoV-2/FLU/RSV plus assay is intended as an aid in the diagnosis of influenza from Nasopharyngeal swab specimens and should not be used as a sole basis for treatment. Nasal washings and aspirates are unacceptable for Xpert Xpress SARS-CoV-2/FLU/RSV testing.  Fact Sheet for Patients: EntrepreneurPulse.com.au  Fact Sheet for Healthcare Providers: IncredibleEmployment.be  This test is not yet approved or cleared by the [place] FDA and has been authorized for detection and/or diagnosis of SARS-CoV-2 by FDA under an Emergency Use Authorization (EUA). This EUA will remain in effect (meaning this test can be used) for the duration of the COVID-19 declaration under Section 564(b)(1) of the Act, 21 U.S.C. section 360bbb-3(b)(1), unless the authorization is terminated or revoked.  Performed at [facility], [place] [address], [place],  [place] [address]     Procedures/Studies: DG Chest 1 View  Result Date: 12/13/2020 CLINICAL DATA:  65 year old female with shortness of breath.  COPD. EXAM: CHEST  1 VIEW COMPARISON:  Portable chest 11/07/2020 and earlier. FINDINGS: Portable AP semi upright view at 0352 hours. Stable lung volumes. Stable cardiac size and mediastinal contours. Chronic mild cardiomegaly. Visualized tracheal air column is within normal limits. Stable right chest dual lumen dialysis type catheter. Chronic increased interstitial markings throughout the lungs, with early peripheral honeycombing demonstrated on a 2010 chest CT. Chronic upper lobe emphysema. No superimposed pneumothorax, pleural effusion. But interstitial markings remain increased compared to November, similar to last month. No consolidation. Stable visualized osseous structures. IMPRESSION: Acute on chronic interstitial opacity similar to last month and suspected due to pulmonary edema superimposed on chronic emphysema and developing pulmonary fibrosis. Electronically Signed   By: Genevie Ann M.D.   On: 12/13/2020 03:59   DG Chest 2 View  Result Date: 12/15/2020 CLINICAL DATA:  Pulmonary edema. EXAM: CHEST - 2 VIEW COMPARISON:  December 13, 2020 FINDINGS: There is stable right-sided venous catheter positioning. Mild, diffuse, chronic appearing increased interstitial lung markings are seen. There is no evidence of a pleural effusion or pneumothorax. The cardiac silhouette is mildly enlarged. Mild calcification of the aortic arch is seen. The visualized skeletal structures are unremarkable. IMPRESSION: Mild diffuse, chronic appearing increased interstitial lung markings without acute cardiopulmonary disease. Electronically Signed   By: Virgina Norfolk M.D.   On: 12/15/2020 20:17   CARDIAC CATHETERIZATION  Result Date: 11/28/2020  Colon Flattery Cx lesion is 20% stenosed.  Mid LAD-1 lesion is 35% stenosed.  Mid LAD-2 lesion is 30% stenosed.  Prox  RCA-1 lesion is 70% stenosed.  Prox  RCA-2 lesion is 50% stenosed.  Mid RCA lesion is 20% stenosed.  Dist RCA lesion is 20% stenosed.  Mid Cx lesion is 20% stenosed.  There is multivessel coronary calcification involving all coronary arteries. The LAD had smooth 30 to 40% narrowing between the first and second diagonal vessel with 30% narrowing beyond the second diagonal vessel; the circumflex vessel had mild 20% ostial stenosis, and had 20% mid stenosis with minimal luminal irregularity throughout; the right coronary artery had proximal 65- 70% stenosis followed by 50% mid stenosis with segmental 20 and 30% mid distal stenoses with a dominant PDA and PLA vessel. LVEDP 22 mm. Significant blood pressure differential between the central aorta/ RFA pressures   and a blood pressure cuff on the left lower extremity with approximately 161 mm systolic differential. RECOMMENDATION: The patient's global LV dysfunction with EF of 30% is out of proportion to her CAD.  She has not been on significant anti-ischemic medication and has been on carvedilol in addition to losartan.  Will add amlodipine which would be helpful both for blood pressure control as well as potential anti-ischemic benefit.  VAS US DUPLEX DIALYSIS ACCESS (AVF,AVG)  Result Date: 11/30/2020 DIALYSIS ACCESS Reason for Exam: Routine follow up. Access Site: Left Upper Extremity. Access Type: Basilic vein transposition. History: Left arm AVF created 07/27/2020. second stage surgery 11/02/20. Comparison Study: 09/05/20 Performing Technologist: June Leap RDMS, RVT  Examination Guidelines: A complete evaluation includes B-mode imaging, spectral Doppler, color Doppler, and power Doppler as needed of all accessible portions of each vessel. Unilateral testing is considered an integral part of a complete examination. Limited examinations for reoccurring indications may be performed as noted.  Findings: +--------------------+----------+-----------------+--------+ AVF                 PSV (cm/s)Flow  Vol (mL/min)Comments +--------------------+----------+-----------------+--------+ Native artery inflow   237          1454                +--------------------+----------+-----------------+--------+ AVF Anastomosis        682                              +--------------------+----------+-----------------+--------+  +------------+----------+-------------+----------+--------+ OUTFLOW VEINPSV (cm/s)Diameter (cm)Depth (cm)Describe +------------+----------+-------------+----------+--------+ Shoulder       156        1.06        1.02            +------------+----------+-------------+----------+--------+ Prox UA        530        0.60        0.38   stenotic +------------+----------+-------------+----------+--------+ Mid UA         195        0.50        0.18            +------------+----------+-------------+----------+--------+ Dist UA        295        0.51        0.22            +------------+----------+-------------+----------+--------+ AC Fossa       545        0.44        1.06            +------------+----------+-------------+----------+--------+   Summary: Patent arteriovenous fistula. *See table(s) above for measurements and observations.  Diagnosing physician: Ruta Hinds MD Electronically signed  by Ruta Hinds MD on 11/30/2020 at 4:05:58 PM.    --------------------------------------------------------------------------------   Final     Labs: BNP (last 3 results) Recent Labs    05/25/20 0032 11/07/20 1215 12/13/20 0243  BNP 209.4* 1,369.1* 4,967.5*   Basic Metabolic Panel: Recent Labs  Lab 12/13/20 0243 12/14/20 0423 12/15/20 0241 12/16/20 0348  NA 140 134* 142 139  K 4.4 3.3* 3.6 3.5  CL 102 98 104 102  CO2 21* _0 GLUCOSE 152* 137* 105* 110*  BUN 84* 32* 50* 24*  CREATININE 8.45* 4.04* 5.79* 4.00*  CALCIUM 7.9* 7.9* 8.4* 8.7*  PHOS  --  5.2* 9.2* 5.4*   Liver Function Tests: Recent Labs  Lab 12/13/20 0243 12/14/20 0423  12/15/20 0241 12/16/20 0348  AST 10*  --   --   --   ALT 9  --   --   --   ALKPHOS 72  --   --   --   BILITOT 0.3  --   --   --   PROT 6.1*  --   --   --   ALBUMIN 2.9* 2.8* 2.6* 2.6*   No results for input(s): LIPASE, AMYLASE in the last 168 hours. No results for input(s): AMMONIA in the last 168 hours. CBC: Recent Labs  Lab 12/13/20 0243 12/14/20 0423 12/15/20 0241 12/15/20 1047 12/16/20 0348  WBC 11.3* 9.1 6.2  --  5.6  NEUTROABS 8.9*  --   --   --   --   HGB 7.8* 7.3* 6.8* 7.8* 7.9*  HCT 25.1* 23.1* 22.5* 24.6* 24.8*  MCV 96.2 91.7 94.9  --  92.5  PLT 299 270 240  --  244   Cardiac Enzymes: No results for input(s): CKTOTAL, CKMB, CKMBINDEX, TROPONINI in the last 168 hours. BNP: Invalid input(s): POCBNP CBG: Recent Labs  Lab 12/15/20 0654 12/15/20 1155 12/15/20 1803 12/15/20 2103 12/16/20 0639  GLUCAP 107* 117* 141* 204* 116*   D-Dimer No results for input(s): DDIMER in the last 72 hours. Hgb A1c No results for input(s): HGBA1C in the last 72 hours. Lipid Profile No results for input(s): CHOL, HDL, LDLCALC, TRIG, CHOLHDL, LDLDIRECT in the last 72 hours. Thyroid function studies No results for input(s): TSH, T4TOTAL, T3FREE, THYROIDAB in the last 72 hours.  Invalid input(s): FREET3 Anemia work up No results for input(s): VITAMINB12, FOLATE, FERRITIN, TIBC, IRON, RETICCTPCT in the last 72 hours. Urinalysis    Component Value Date/Time   COLORURINE YELLOW 07/23/2020 0146   APPEARANCEUR CLOUDY (A) 07/23/2020 0146   LABSPEC 1.010 07/23/2020 0146   PHURINE 5.0 07/23/2020 0146   GLUCOSEU 50 (A) 07/23/2020 0146   HGBUR MODERATE (A) 07/23/2020 0146   BILIRUBINUR NEGATIVE 07/23/2020 0146   KETONESUR NEGATIVE 07/23/2020 0146   PROTEINUR >=300 (A) 07/23/2020 0146   UROBILINOGEN 1.0 10/22/2013 1626   NITRITE NEGATIVE 07/23/2020 0146   LEUKOCYTESUR NEGATIVE 07/23/2020 0146   Sepsis Labs Invalid input(s): PROCALCITONIN,  WBC,   LACTICIDVEN Microbiology Recent Results (from the past 240 hour(s))  Resp Panel by RT-PCR (Flu A&B, Covid) Nasopharyngeal Swab     Status: None   Collection Time: 12/13/20  2:44 AM   Specimen: Nasopharyngeal Swab; Nasopharyngeal(NP) swabs in vial transport medium  Result Value Ref Range Status   SARS Coronavirus 2 by RT PCR NEGATIVE NEGATIVE Final    Comment: (NOTE) SARS-CoV-2 target nucleic acids are NOT DETECTED.  The SARS-CoV-2 RNA is generally detectable in upper respiratory specimens during the acute phase of infection. The  lowest concentration of SARS-CoV-2 viral copies this assay can detect is 138 copies/mL. A negative result does not preclude SARS-Cov-2 infection and should not be used as the sole basis for treatment or other patient management decisions. A negative result may occur with  improper specimen collection/handling, submission of specimen other than nasopharyngeal swab, presence of viral mutation(s) within the areas targeted by this assay, and inadequate number of viral copies(<138 copies/mL). A negative result must be combined with clinical observations, patient history, and epidemiological information. The expected result is Negative.  Fact Sheet for Patients:  EntrepreneurPulse.com.au  Fact Sheet for Healthcare Providers:  IncredibleEmployment.be  This test is no t yet approved or cleared by the [place] FDA and  has been authorized for detection and/or diagnosis of SARS-CoV-2 by FDA under an Emergency Use Authorization (EUA). This EUA will remain  in effect (meaning this test can be used) for the duration of the COVID-19 declaration under Section 564(b)(1) of the Act, 21 U.S.C.section 360bbb-3(b)(1), unless the authorization is terminated  or revoked sooner.       Influenza A by PCR NEGATIVE NEGATIVE Final   Influenza B by PCR NEGATIVE NEGATIVE Final    Comment: (NOTE) The Xpert Xpress SARS-CoV-2/FLU/RSV plus  assay is intended as an aid in the diagnosis of influenza from Nasopharyngeal swab specimens and should not be used as a sole basis for treatment. Nasal washings and aspirates are unacceptable for Xpert Xpress SARS-CoV-2/FLU/RSV testing.  Fact Sheet for Patients: EntrepreneurPulse.com.au  Fact Sheet for Healthcare Providers: IncredibleEmployment.be  This test is not yet approved or cleared by the [place] FDA and has been authorized for detection and/or diagnosis of SARS-CoV-2 by FDA under an Emergency Use Authorization (EUA). This EUA will remain in effect (meaning this test can be used) for the duration of the COVID-19 declaration under Section 564(b)(1) of the Act, 21 U.S.C. section 360bbb-3(b)(1), unless the authorization is terminated or revoked.  Performed at [facility], [place] [address], [place], [place] [address]      Time coordinating discharge: 25 minutes  SIGNED: Antonieta Pert, MD  [facility] 12/16/2020, 11:19 AM  If 7PM-7AM, please contact night-coverage [email or web address]

## 2020-12-16 NOTE — TOC Transition Note (Signed)
Transition of Care [facility]) - CM/SW Discharge Note   Patient Details  Name: Tina Gomez MRN: [id number] Date of Birth: 09-16-1955  Transition of Care [facility]) CM/SW Contact:  Konrad Penta, RN Phone Number: [phone] 12/16/2020, 11:35 AM   Clinical Narrative:  Spoke with Ms. Gracia who confirms she is transitioning to home today. She will stay with her daughter in [place] initially.  Discussed that referral was placed for outpatient PT.   Discussed oxygen referral was made to [place] [place] and oxygen will be delivered to room prior to discharge.   Ms. Klinger denies having any other DME needs.   No further needs identified.     Final next level of care: OP Rehab Barriers to Discharge: No Barriers Identified   Patient Goals and CMS Choice Patient states their goals for this hospitalization and ongoing recovery are:: return home CMS Medicare.gov Compare Post Acute Care list provided to:: Patient Choice offered to / list presented to : Patient  Discharge Placement                       Discharge Plan and Services                DME Arranged: Oxygen DME Agency: [employer] Date DME Agency Contacted: 12/16/20 Time DME Agency Contacted: [id number] Representative spoke with at DME Agency: [place] ([place]) Interventions     Readmission Risk Interventions Readmission Risk Prevention Plan 07/28/2020  Transportation Screening Complete  PCP or Specialist Appt within 3-5 Days Not Complete  Not Complete comments first available PCP is 12/16  [place] or [place] Complete  Social Work Consult for [place] Planning/Counseling Complete  Palliative Care Screening Not Applicable  Some recent data might be hidden    Marthenia Rolling, MSN, RN,BSN Inpatient [facility] Case Manager [phone]

## 2020-12-16 NOTE — Progress Notes (Signed)
DISCHARGE NOTE HOME Tina Gomez to be discharged Home per MD order. Discussed prescriptions and follow up appointments with the patient. Prescriptions given to patient; medication list explained in detail. Patient verbalized understanding.  Skin clean, dry and intact without evidence of skin break down, no evidence of skin tears noted. IV catheter discontinued intact. Site without signs and symptoms of complications. Dressing and pressure applied. Pt denies pain at the site currently. No complaints noted.  Patient free of lines, drains, and wounds.   An After Visit Summary (AVS) was printed and given to the patient. Patient escorted via wheelchair, and discharged home via private auto.  Berneta Levins, RN

## 2020-12-16 NOTE — Progress Notes (Signed)
[facility] Progress Note   Subjective:   Patient seen and examined at bedside.  Tolerated dialysis well yesterday.  Breathing improved today.  Denies CP, SOB, n/v/d, and abdominal pain.  Discussed importance of working with PT/OT and accepting their help if they recommend additional therapy after discharge, agreed to consider.   Objective Vitals:   12/15/20 1645 12/15/20 1801 12/15/20 2103 12/16/20 0558  BP: (!) 156/71 (!) 156/70 (!) 153/55 (!) 169/55  Pulse: (!) 58 (!) 57 64 63  Resp: '18  16 16  '$ Temp: 97.6 F (36.4 C) 97.9 F (36.6 C) 98.4 F (36.9 C) (!) 97.5 F (36.4 C)  TempSrc: Oral Oral    SpO2: 99% 97% 97% 96%  Weight: 75.9 kg  75.9 kg   Height:       Physical Exam General:chroncially ill appearing female in NAD Heart:RRR, [id number] systolic murmur Lungs:CTAB, +expiratory wheeze Abdomen:soft, NTND Extremities:no LE edema Dialysis Access: TDC, LU AVF +b/t, some edema in around upper portion of access   [facility] Weights   12/15/20 1305 12/15/20 1645 12/15/20 2103  Weight: 79.2 kg 75.9 kg 75.9 kg    Intake/Output Summary (Last 24 hours) at 12/16/2020 0918 Last data filed at 12/16/2020 0558 Gross per 24 hour  Intake 480 ml  Output 2600 ml  Net -2120 ml    Additional Objective Labs: Basic Metabolic Panel: Recent Labs  Lab 12/14/20 0423 12/15/20 0241 12/16/20 0348  NA 134* 142 139  K 3.3* 3.6 3.5  CL 98 104 102  CO2 '25 25 29  '$ GLUCOSE 137* 105* 110*  BUN 32* 50* 24*  CREATININE 4.04* 5.79* 4.00*  CALCIUM 7.9* 8.4* 8.7*  PHOS 5.2* 9.2* 5.4*   Liver Function Tests: Recent Labs  Lab 12/13/20 0243 12/14/20 0423 12/15/20 0241 12/16/20 0348  AST 10*  --   --   --   ALT 9  --   --   --   ALKPHOS 72  --   --   --   BILITOT 0.3  --   --   --   PROT 6.1*  --   --   --   ALBUMIN 2.9* 2.8* 2.6* 2.6*   CBC: Recent Labs  Lab 12/13/20 0243 12/14/20 0423 12/15/20 0241 12/15/20 1047 12/16/20 0348  WBC 11.3* 9.1 6.2  --  5.6  NEUTROABS 8.9*  --    --   --   --   HGB 7.8* 7.3* 6.8* 7.8* 7.9*  HCT 25.1* 23.1* 22.5* 24.6* 24.8*  MCV 96.2 91.7 94.9  --  92.5  PLT 299 270 240  --  244   CBG: Recent Labs  Lab 12/15/20 0654 12/15/20 1155 12/15/20 1803 12/15/20 2103 12/16/20 0639  GLUCAP 107* 117* 141* 204* 116*   Studies/Results: DG Chest 2 View  Result Date: 12/15/2020 CLINICAL DATA:  Pulmonary edema. EXAM: CHEST - 2 VIEW COMPARISON:  December 13, 2020 FINDINGS: There is stable right-sided venous catheter positioning. Mild, diffuse, chronic appearing increased interstitial lung markings are seen. There is no evidence of a pleural effusion or pneumothorax. The cardiac silhouette is mildly enlarged. Mild calcification of the aortic arch is seen. The visualized skeletal structures are unremarkable. IMPRESSION: Mild diffuse, chronic appearing increased interstitial lung markings without acute cardiopulmonary disease. Electronically Signed   By: Virgina Norfolk M.D.   On: 12/15/2020 20:17    Medications:  . (feeding supplement) PROSource Plus  30 mL Oral BID BM  . aspirin EC  325 mg Oral Daily  . atorvastatin  40 mg Oral QPM  . calcium acetate  1,334 mg Oral TID WC  . calcium acetate  667 mg Oral With snacks  . calcium carbonate  1,250 mg Oral BID WC  . carvedilol  12.5 mg Oral BID WC  . Chlorhexidine Gluconate Cloth  6 each Topical Q0600  . doxercalciferol  2 mcg Intravenous Q M,W,F-HD  . heparin  5,000 Units Subcutaneous Q8H  . insulin aspart  0-6 Units Subcutaneous TID WC  . losartan  50 mg Oral Daily    Dialysis Orders: MWF at [facility] ---> cuts HD time q HD, gets at most 2:45hr treatments 3:45hr, 400/500, EDW 75kg, 3K/2.5Ca, UFP #4, AVF, heparin 2000 bolus - Hectoral 86mg IV q HD - Venofer '100mg'$  x 10 ordered - s/p 4 of 10 so far - Mircera 2222m IV q 2 weeks (last 3/28)  Assessment/Plan: 1. Dyspnea:Multifactorial - she does have pulmomary edema, but also with quite significant COPD and HFrEF and chronic anemia. WOuld benefit  from home O2 according to saturation qualifications. CXR post HD yesterday shows much improvement in pulmonary edema.  2. ESRD:Usual MWF schedule -- apparently has been having a rough time with HD. Cannot tolerating sitting for full treatment d/t RLS/anxiety - just started on gabapentin and SSRI in addition to Requip which may help. So far, does not appear dialysis is adding to her QOL. Will attempt to fix the fixable and then reassess.Tolerated HD yesterday.  3. Hypertension/volume:BPelevated but drops during dialysis. Successful UF of 2.5L yesterday.  Respiratory status and CXR improved.  Discussed strict adherence to fluid restrictions to help prevent pulmonary edema in the future especially given she tolerated minimal fluid removal. Not quite to dry weight post HD, will increase EDW to 75.5kg on d/c.  4. Anemia:Hgb 7.9 s/p 1 unit pRBC 4/8- on max ESA as outpatient, not due for next dose yet. 5. Metabolic bone disease:CorrCa/Phos ok- continue home meds. 6. Nutrition:Alb low,continue proteinsupplements. 7. HFrEF (EF 30%): Recent LHC with non-obstuctive CAD  8. COPD: per primary.  Home O2 on discharge.   9. T2DM/gastroparesis 10. Dispo - ok to d/c from renal standpoint    LiJen MowPA-C [place]/05/2021,9:18 AM  LOS: 3 days

## 2020-12-17 ENCOUNTER — Telehealth: Payer: Self-pay | Admitting: Nephrology

## 2020-12-17 NOTE — Telephone Encounter (Signed)
Transition of Care Contact from [place]   Date of Discharge: 12/16/20 Date of Contact: 12/17/20 Method of contact: phone Talked to patient   Patient contacted to discuss transition of care form recent hospitaliztion. Patient was admitted to [facility] from 4/6 to 12/16/20 with the discharge diagnosis of dyspnea.     Medication changes were reviewed - advised patient to continue phoslo 2AC TID as Rx prior to admission.   Patient will follow up with is outpatient dialysis center 12/18/20.   Other follow up needs include - none identified.    Jen Mow, PA-C [facility] Pager: [phone]

## 2020-12-18 ENCOUNTER — Telehealth: Payer: Self-pay

## 2020-12-18 ENCOUNTER — Other Ambulatory Visit: Payer: Self-pay

## 2020-12-18 NOTE — Telephone Encounter (Signed)
Transition Care Management Follow-up Telephone Call  Date of discharge and from where: 12/16/2020, [facility]   How have you been since you were released from the hospital? She said she is feeling much better, just tired because she finished dialysis  Any questions or concerns? No  Items Reviewed:  Did the pt receive and understand the discharge instructions provided? Yes  - her daughter has them.   Medications obtained and verified? Yes  -  She said she has all of her medications. she said she is not taking the requip any longer.  She has been started on gabapentin instead for restless legs. She took it last night and said that it helped. Her daughter helps to manage her medication regime.   Other? No   Any new allergies since your discharge? No   Dietary orders reviewed? Yes and she said she is adhering to 1200 ml/day fluid restriction.   Do you have support at home? Yes , her daughter  [place] and Equipment/Supplies: Were home health services ordered? no If so, what is the name of the agency? n/a  Has the agency set up a time to come to the patient's home? not applicable  Were any new equipment or medical supplies ordered?  Yes: O2.  - she said that she only needs it at night and uses it @ 2L.  She has not needed it during the day and HD has O2 if she needs it when she is having her treatment.  What is the name of the medical supply agency? [facility]  Were you able to get the supplies/equipment? yes Do you have any questions related to the use of the equipment or supplies? No   She already has a working glucometer, nebulizer and pulse oximeter.   Attends HD M/W/F at [place]: (I = Independent and D = Dependent) ADLs: Independent, has walker to use when she goes out of the house. She said that she doesn't need an assistive device when ambulating indoors.   Follow up appointments reviewed:   PCP Hospital f/u appt confirmed? Yes  - Dr  Margarita Rana 01/30/2021. She did not want to schedule an appointment to be seen sooner.   [facility] f/u appt confirmed? Yes  - cardiology 12/28/2020.    Are transportation arrangements needed? No   If their condition worsens, is the pt aware to call PCP or go to the Emergency Dept.? Yes  Was the patient provided with contact information for the PCP's office or ED? Yes  Was to pt encouraged to call back with questions or concerns? Yes

## 2020-12-19 ENCOUNTER — Other Ambulatory Visit: Payer: Self-pay

## 2020-12-19 ENCOUNTER — Other Ambulatory Visit: Payer: Self-pay | Admitting: Family Medicine

## 2020-12-19 MED ORDER — LOSARTAN POTASSIUM 50 MG PO TABS
50.0000 mg | ORAL_TABLET | Freq: Every day | ORAL | 2 refills | Status: DC
Start: 2020-12-19 — End: 2021-01-04
  Filled 2020-12-19: qty 30, 30d supply, fill #0

## 2020-12-19 MED FILL — Carvedilol Tab 12.5 MG: ORAL | 30 days supply | Qty: 60 | Fill #0 | Status: CN

## 2020-12-19 NOTE — Telephone Encounter (Signed)
Requested medication (s) are due for refill today: yes  Requested medication (s) are on the active medication list: yes  Last refill:  11/10/2020  Future visit scheduled: yes  Notes to clinic: Medication prescribed by a different provider  Review for refill    Requested Prescriptions  Pending Prescriptions Disp Refills   losartan (COZAAR) 50 MG tablet 30 tablet 0    Sig: TAKE 1 TABLET (50 MG TOTAL) BY MOUTH DAILY.      Cardiovascular:  Angiotensin Receptor Blockers Failed - 12/19/2020 10:47 AM      Failed - Cr in normal range and within 180 days    Creat  Date Value Ref Range Status  11/13/2016 1.25 (H) 0.50 - 1.05 mg/dL Final    Comment:      For patients > or = 64 years of age: The upper reference limit for Creatinine is approximately 13% higher for people identified as African-American.      Creatinine, Ser  Date Value Ref Range Status  12/16/2020 4.00 (H) 0.44 - 1.00 mg/dL Final   Creatinine, Urine  Date Value Ref Range Status  07/23/2020 46.68 mg/dL Final          Failed - Last BP in normal range    BP Readings from Last 1 Encounters:  12/16/20 (!) 168/55          Passed - K in normal range and within 180 days    Potassium  Date Value Ref Range Status  12/16/2020 3.5 3.5 - 5.1 mmol/L Final          Passed - Patient is not pregnant      Passed - Valid encounter within last 6 months    Recent Outpatient Visits           1 week ago Pulmonary emphysema, unspecified emphysema type ([place])   [place], Taylor, MD   3 months ago Hypertension in stage 5 chronic kidney disease due to type 2 diabetes mellitus ([place])   [place], [place], MD   6 months ago Type 2 diabetes mellitus with diabetic neuropathy, without long-term current use of insulin ([place])   [place], [place] L, RPH-CPP   6 months ago Helicobacter pylori gastritis   [place], Charlane Ferretti, MD   7 months ago Type 2 diabetes mellitus with diabetic neuropathy, without long-term current use of insulin ([place])   [place], MD       Future Appointments             In 1 week C[place], Denice Bors, MD [facility] [place], [facility]   In 1 month Charlott Rakes, MD [place]

## 2020-12-21 NOTE — Progress Notes (Signed)
HPI: Follow-up CHF.  Carotid Dopplers February 2015 showed 1 to 39% bilateral stenosis.  Echocardiogram March 2022 showed ejection fraction 30%, mild left ventricular enlargement, mild left ventricular hypertrophy, grade 2 diastolic dysfunction, mild RV dysfunction, moderate left atrial enlargement, mild right atrial enlargement, mild mitral regurgitation.  Cardiac catheterization March 2022 showed 70% proximal right coronary artery followed by 50% with otherwise nonobstructive disease.  Cardiomyopathy felt out of proportion to coronary artery disease and she has been treated medically.  Patient also on dialysis for end-stage renal disease.  Since last seen she denies dyspnea, chest pain, palpitations or syncope.  She is having difficulties with hypotension on her dialysis days to the point that she has had syncope.  Her blood pressure is running higher on nondialysis days.  Current Outpatient Medications  Medication Sig Dispense Refill  . albuterol (PROVENTIL) (2.5 MG/3ML) 0.083% nebulizer solution Take 3 mLs (2.5 mg total) by nebulization every 6 (six) hours as needed for wheezing or shortness of breath. 75 mL 1  . albuterol (VENTOLIN HFA) 108 (90 Base) MCG/ACT inhaler Inhale 2 puffs into the lungs every 6 (six) hours as needed for wheezing or shortness of breath. 8.5 g 1  . aspirin EC 325 MG tablet Take 325 mg by mouth daily.    Mar[place] Kitchen atorvastatin (LIPITOR) 40 MG tablet TAKE 1 TABLET BY MOUTH DAILY AT 6 PM. 90 tablet 1  . B Complex-C-Zn-Folic Acid (DIALYVITE 941 WITH ZINC) 0.8 MG TABS Take 1 tablet by mouth at bedtime.    . Blood Glucose Monitoring Suppl (BLOOD GLUCOSE METER) kit Use as instructed 1 each 0  . Blood Glucose Monitoring Suppl (TRUE METRIX METER) DEVI 1 each by Does not apply route 3 (three) times daily before meals. 1 each 0  . calcium acetate (PHOSLO) 667 MG capsule Take 1 capsule (667 mg total) by mouth with snacks. 90 capsule 0  . carvedilol (COREG) 12.5 MG tablet TAKE 1 TABLET  (12.5 MG TOTAL) BY MOUTH 2 (TWO) TIMES DAILY WITH A MEAL. 60 tablet 3  . FLUoxetine (PROZAC) 20 MG capsule Take 1 capsule (20 mg total) by mouth daily. 30 capsule 3  . gabapentin (NEURONTIN) 100 MG capsule Take 1 capsule (100 mg total) by mouth at bedtime. 30 capsule 3  . glucose blood (TRUETEST TEST) test strip Use as instructed 100 each 12  . hydrOXYzine (ATARAX/VISTARIL) 25 MG tablet Take 1 tablet (25 mg total) by mouth 3 (three) times daily as needed. 60 tablet 1  . Lancets (FREESTYLE) lancets Use as instructed 100 each 12  . lidocaine-prilocaine (EMLA) cream Apply 1 application topically See admin instructions. APPLY SMALL AMOUNT TO ACCESS SITE (AVF) 1 TO 2 HOURS BEFORE DIALYSIS. COVER WITH OCCLUSIVE DRESSING (SARAN WRAP).    Marland Kitchen losartan (COZAAR) 50 MG tablet Take 1 tablet (50 mg total) by mouth daily. 30 tablet 2  . metoCLOPramide (REGLAN) 5 MG tablet TAKE 1 TABLET BY MOUTH FOUR TIMES DAILY, BEFORE MEALS AND AT BEDTIME. (Patient taking differently: Take 5 mg by mouth daily as needed for nausea or vomiting.) 120 tablet 1  . TRUEplus Lancets 28G MISC 1 each by Does not apply route 3 (three) times daily before meals. 100 each 12   Current Facility-Administered Medications  Medication Dose Route Frequency Provider Last Rate Last Admin  . sodium chloride flush (NS) 0.9 % injection 3 mL  3 mL Intravenous Q12H Almyra Deforest, PA         Past Medical History:  Diagnosis Date  .  Anemia   . Chronic kidney disease   . COPD (chronic obstructive pulmonary disease) ([place]) 05/2019   no inhaler  . Diabetes mellitus without complication ([place])    no meds - diet controlled  . History of blood transfusion 09/2020   1 unit  . HLD (hyperlipidemia)   . Hypertension   . PONV (postoperative nausea and vomiting)   . Restless legs syndrome (RLS)   . Stroke [facility])     Past Surgical History:  Procedure Laterality Date  . AV FISTULA PLACEMENT Left 07/27/2020   Procedure: LEFT BRACHIO-BASILIC ARTERIOVENOUS (AV)  FISTULA CREATION;  Surgeon: Cherre Robins, MD;  Location: [place];  Service: Vascular;  Laterality: Left;  . BASCILIC VEIN TRANSPOSITION Left 11/02/2020   Procedure: LEFT UPPER ARM SECOND STAGE BASILIC VEIN TRANSPOSITION;  Surgeon: Cherre Robins, MD;  Location: [place];  Service: Vascular;  Laterality: Left;  PERIPHERAL NERVE BLOCK  . CESAREAN SECTION     x 1  . ESOPHAGOGASTRODUODENOSCOPY (EGD) WITH PROPOFOL N/A 05/26/2020   Procedure: ESOPHAGOGASTRODUODENOSCOPY (EGD) WITH PROPOFOL;  Surgeon: Irene Shipper, MD;  Location: [facility] ENDOSCOPY;  Service: Endoscopy;  Laterality: N/A;  . INSERTION OF DIALYSIS CATHETER Right 07/27/2020   Procedure: INSERTION OF RIGHT INTERNAL JUGULAR TUNNELED DIALYSIS CATHETER;  Surgeon: Cherre Robins, MD;  Location: [place];  Service: Vascular;  Laterality: Right;  . IR FLUORO GUIDE CV LINE RIGHT  07/25/2020  . IR US GUIDE VASC ACCESS RIGHT  07/25/2020  . LEFT HEART CATH AND CORONARY ANGIOGRAPHY N/A 11/28/2020   Procedure: LEFT HEART CATH AND CORONARY ANGIOGRAPHY;  Surgeon: Troy Sine, MD;  Location: [place] CV LAB;  Service: Cardiovascular;  Laterality: N/A;  . TONSILLECTOMY    . UPPER GI ENDOSCOPY     growth removed from voice box  . WRIST SURGERY Left    ganglion cyst removal    Social History   Socioeconomic History  . Marital status: Divorced    Spouse name: Not on file  . Number of children: Not on file  . Years of education: Not on file  . Highest education level: Not on file  Occupational History  . Not on file  Tobacco Use  . Smoking status: Current Every Day Smoker    Packs/day: 0.25    Years: 10.00    Pack years: 2.50    Types: Cigarettes  . Smokeless tobacco: Never Used  . Tobacco comment: 5 cigs daily  Vaping Use  . Vaping Use: Never used  Substance and Sexual Activity  . Alcohol use: No  . Drug use: No  . Sexual activity: Not on file  Other Topics Concern  . Not on file  Social History Narrative  . Not on file   Social  Determinants of Health   Financial Resource Strain: Not on file  Food Insecurity: Not on file  Transportation Needs: Not on file  Physical Activity: Not on file  Stress: Not on file  Social Connections: Not on file  Intimate Partner Violence: Not on file    Family History  Problem Relation Age of Onset  . Stroke Mother   . Hypertension Mother   . Hyperlipidemia Mother   . Hypertension Father   . Hyperlipidemia Father     ROS: no fevers or chills, productive cough, hemoptysis, dysphasia, odynophagia, melena, hematochezia, dysuria, hematuria, rash, seizure activity, orthopnea, PND, pedal edema, claudication. Remaining systems are negative.  Physical Exam: Well-developed well-nourished in no acute distress.  Skin is warm and dry.  HEENT is normal.  Neck is supple.  Chest is clear to auscultation with normal expansion.  Cardiovascular exam is regular rate and rhythm.  Abdominal exam nontender or distended. No masses palpated. Extremities show no edema.  Fistula left upper extremity neuro grossly intact  ECG-December 13, 2020-sinus rhythm with first-degree AV block.  Personally reviewed  A/P  1 coronary artery disease-continue aspirin and statin.  2 cardiomyopathy-felt to be out of portion to coronary artery disease.  Plan to continue medical therapy.  Continue carvedilol and losartan.  I am hesitant to advance these medications as she is having hypotension postdialysis on Monday Wednesdays and Fridays.  They are trying to adjust her dry weight.  Hopefully we can increase medications in the future.  3 hypertension-patient's blood pressure is elevated; however low on dialysis days.  We will advance in the future if needed.  4 hyperlipidemia-continue statin.  5 end-stage renal disease-management per nephrology.  6 tobacco abuse-patient counseled on discontinuing.  Kirk Ruths, MD

## 2020-12-25 ENCOUNTER — Other Ambulatory Visit: Payer: Self-pay

## 2020-12-26 ENCOUNTER — Other Ambulatory Visit: Payer: Self-pay

## 2020-12-28 ENCOUNTER — Ambulatory Visit (INDEPENDENT_AMBULATORY_CARE_PROVIDER_SITE_OTHER): Payer: Medicaid Other | Admitting: Cardiology

## 2020-12-28 ENCOUNTER — Other Ambulatory Visit: Payer: Self-pay

## 2020-12-28 ENCOUNTER — Encounter: Payer: Self-pay | Admitting: Cardiology

## 2020-12-28 VITALS — BP 174/50 | HR 68 | Ht 62.0 in | Wt 176.4 lb

## 2020-12-28 DIAGNOSIS — I10 Essential (primary) hypertension: Secondary | ICD-10-CM

## 2020-12-28 DIAGNOSIS — E785 Hyperlipidemia, unspecified: Secondary | ICD-10-CM

## 2020-12-28 DIAGNOSIS — I420 Dilated cardiomyopathy: Secondary | ICD-10-CM

## 2020-12-28 DIAGNOSIS — I2510 Atherosclerotic heart disease of native coronary artery without angina pectoris: Secondary | ICD-10-CM

## 2020-12-28 NOTE — Patient Instructions (Signed)

## 2020-12-31 ENCOUNTER — Other Ambulatory Visit: Payer: Self-pay

## 2020-12-31 ENCOUNTER — Emergency Department (HOSPITAL_BASED_OUTPATIENT_CLINIC_OR_DEPARTMENT_OTHER): Payer: Medicare Other

## 2020-12-31 ENCOUNTER — Encounter (HOSPITAL_BASED_OUTPATIENT_CLINIC_OR_DEPARTMENT_OTHER): Payer: Self-pay | Admitting: Emergency Medicine

## 2020-12-31 ENCOUNTER — Inpatient Hospital Stay (HOSPITAL_BASED_OUTPATIENT_CLINIC_OR_DEPARTMENT_OTHER)
Admission: EM | Admit: 2020-12-31 | Discharge: 2021-01-04 | DRG: 640 | Disposition: A | Discharge status: Home / Self Care | Payer: Medicare Other | Attending: Internal Medicine | Admitting: Internal Medicine

## 2020-12-31 DIAGNOSIS — Z91048 Other nonmedicinal substance allergy status: Secondary | ICD-10-CM

## 2020-12-31 DIAGNOSIS — J810 Acute pulmonary edema: Secondary | ICD-10-CM

## 2020-12-31 DIAGNOSIS — Z8249 Family history of ischemic heart disease and other diseases of the circulatory system: Secondary | ICD-10-CM | POA: Diagnosis not present

## 2020-12-31 DIAGNOSIS — D649 Anemia, unspecified: Secondary | ICD-10-CM | POA: Diagnosis not present

## 2020-12-31 DIAGNOSIS — R0600 Dyspnea, unspecified: Secondary | ICD-10-CM | POA: Diagnosis not present

## 2020-12-31 DIAGNOSIS — Z8673 Personal history of transient ischemic attack (TIA), and cerebral infarction without residual deficits: Secondary | ICD-10-CM

## 2020-12-31 DIAGNOSIS — E1140 Type 2 diabetes mellitus with diabetic neuropathy, unspecified: Secondary | ICD-10-CM | POA: Diagnosis not present

## 2020-12-31 DIAGNOSIS — D631 Anemia in chronic kidney disease: Secondary | ICD-10-CM | POA: Diagnosis present

## 2020-12-31 DIAGNOSIS — I5022 Chronic systolic (congestive) heart failure: Secondary | ICD-10-CM | POA: Diagnosis not present

## 2020-12-31 DIAGNOSIS — Z992 Dependence on renal dialysis: Secondary | ICD-10-CM

## 2020-12-31 DIAGNOSIS — E611 Iron deficiency: Secondary | ICD-10-CM | POA: Diagnosis present

## 2020-12-31 DIAGNOSIS — Z885 Allergy status to narcotic agent status: Secondary | ICD-10-CM

## 2020-12-31 DIAGNOSIS — I5042 Chronic combined systolic (congestive) and diastolic (congestive) heart failure: Secondary | ICD-10-CM | POA: Diagnosis present

## 2020-12-31 DIAGNOSIS — J9621 Acute and chronic respiratory failure with hypoxia: Secondary | ICD-10-CM | POA: Diagnosis present

## 2020-12-31 DIAGNOSIS — I2510 Atherosclerotic heart disease of native coronary artery without angina pectoris: Secondary | ICD-10-CM | POA: Diagnosis not present

## 2020-12-31 DIAGNOSIS — E8770 Fluid overload, unspecified: Secondary | ICD-10-CM | POA: Diagnosis not present

## 2020-12-31 DIAGNOSIS — F17210 Nicotine dependence, cigarettes, uncomplicated: Secondary | ICD-10-CM | POA: Diagnosis present

## 2020-12-31 DIAGNOSIS — I10 Essential (primary) hypertension: Secondary | ICD-10-CM | POA: Diagnosis present

## 2020-12-31 DIAGNOSIS — E785 Hyperlipidemia, unspecified: Secondary | ICD-10-CM | POA: Diagnosis not present

## 2020-12-31 DIAGNOSIS — K219 Gastro-esophageal reflux disease without esophagitis: Secondary | ICD-10-CM | POA: Diagnosis not present

## 2020-12-31 DIAGNOSIS — I132 Hypertensive heart and chronic kidney disease with heart failure and with stage 5 chronic kidney disease, or end stage renal disease: Secondary | ICD-10-CM | POA: Diagnosis not present

## 2020-12-31 DIAGNOSIS — I953 Hypotension of hemodialysis: Secondary | ICD-10-CM | POA: Diagnosis not present

## 2020-12-31 DIAGNOSIS — Z83438 Family history of other disorder of lipoprotein metabolism and other lipidemia: Secondary | ICD-10-CM

## 2020-12-31 DIAGNOSIS — Z79899 Other long term (current) drug therapy: Secondary | ICD-10-CM

## 2020-12-31 DIAGNOSIS — I509 Heart failure, unspecified: Secondary | ICD-10-CM

## 2020-12-31 DIAGNOSIS — J90 Pleural effusion, not elsewhere classified: Secondary | ICD-10-CM | POA: Diagnosis not present

## 2020-12-31 DIAGNOSIS — R0902 Hypoxemia: Secondary | ICD-10-CM

## 2020-12-31 DIAGNOSIS — E1122 Type 2 diabetes mellitus with diabetic chronic kidney disease: Secondary | ICD-10-CM | POA: Diagnosis not present

## 2020-12-31 DIAGNOSIS — Z823 Family history of stroke: Secondary | ICD-10-CM

## 2020-12-31 DIAGNOSIS — Z7982 Long term (current) use of aspirin: Secondary | ICD-10-CM | POA: Diagnosis not present

## 2020-12-31 DIAGNOSIS — E8779 Other fluid overload: Secondary | ICD-10-CM | POA: Diagnosis not present

## 2020-12-31 DIAGNOSIS — N250 Renal osteodystrophy: Secondary | ICD-10-CM | POA: Diagnosis not present

## 2020-12-31 DIAGNOSIS — Z20822 Contact with and (suspected) exposure to covid-19: Secondary | ICD-10-CM | POA: Diagnosis present

## 2020-12-31 DIAGNOSIS — G2581 Restless legs syndrome: Secondary | ICD-10-CM | POA: Diagnosis present

## 2020-12-31 DIAGNOSIS — J439 Emphysema, unspecified: Secondary | ICD-10-CM | POA: Diagnosis not present

## 2020-12-31 DIAGNOSIS — Z9981 Dependence on supplemental oxygen: Secondary | ICD-10-CM

## 2020-12-31 DIAGNOSIS — N186 End stage renal disease: Secondary | ICD-10-CM | POA: Diagnosis present

## 2020-12-31 DIAGNOSIS — R0602 Shortness of breath: Secondary | ICD-10-CM | POA: Diagnosis not present

## 2020-12-31 DIAGNOSIS — F419 Anxiety disorder, unspecified: Secondary | ICD-10-CM | POA: Diagnosis present

## 2020-12-31 DIAGNOSIS — R55 Syncope and collapse: Secondary | ICD-10-CM | POA: Diagnosis present

## 2020-12-31 DIAGNOSIS — I120 Hypertensive chronic kidney disease with stage 5 chronic kidney disease or end stage renal disease: Secondary | ICD-10-CM | POA: Diagnosis not present

## 2020-12-31 DIAGNOSIS — J9601 Acute respiratory failure with hypoxia: Secondary | ICD-10-CM

## 2020-12-31 DIAGNOSIS — I517 Cardiomegaly: Secondary | ICD-10-CM | POA: Diagnosis not present

## 2020-12-31 LAB — GLUCOSE, CAPILLARY: Glucose-Capillary: 109 mg/dL — ABNORMAL HIGH (ref 70–99)

## 2020-12-31 LAB — CBC WITH DIFFERENTIAL/PLATELET
Abs Immature Granulocytes: 0.05 10*3/uL (ref 0.00–0.07)
Basophils Absolute: 0 10*3/uL (ref 0.0–0.1)
Basophils Relative: 0 %
Eosinophils Absolute: 0.2 10*3/uL (ref 0.0–0.5)
Eosinophils Relative: 3 %
HCT: 21.4 % — ABNORMAL LOW (ref 36.0–46.0)
Hemoglobin: 6.8 g/dL — CL (ref 12.0–15.0)
Immature Granulocytes: 1 %
Lymphocytes Relative: 18 %
Lymphs Abs: 1.3 10*3/uL (ref 0.7–4.0)
MCH: 30 pg (ref 26.0–34.0)
MCHC: 31.8 g/dL (ref 30.0–36.0)
MCV: 94.3 fL (ref 80.0–100.0)
Monocytes Absolute: 0.7 10*3/uL (ref 0.1–1.0)
Monocytes Relative: 9 %
Neutro Abs: 5.3 10*3/uL (ref 1.7–7.7)
Neutrophils Relative %: 69 %
Platelets: 257 10*3/uL (ref 150–400)
RBC: 2.27 MIL/uL — ABNORMAL LOW (ref 3.87–5.11)
RDW: 16.5 % — ABNORMAL HIGH (ref 11.5–15.5)
WBC: 7.6 10*3/uL (ref 4.0–10.5)
nRBC: 0 % (ref 0.0–0.2)

## 2020-12-31 LAB — RESP PANEL BY RT-PCR (FLU A&B, COVID) ARPGX2
Influenza A by PCR: NEGATIVE
Influenza B by PCR: NEGATIVE
SARS Coronavirus 2 by RT PCR: NEGATIVE

## 2020-12-31 LAB — BASIC METABOLIC PANEL
Anion gap: 16 — ABNORMAL HIGH (ref 5–15)
BUN: 52 mg/dL — ABNORMAL HIGH (ref 8–23)
CO2: 24 mmol/L (ref 22–32)
Calcium: 7.9 mg/dL — ABNORMAL LOW (ref 8.9–10.3)
Chloride: 98 mmol/L (ref 98–111)
Creatinine, Ser: 5.64 mg/dL — ABNORMAL HIGH (ref 0.44–1.00)
GFR, Estimated: 8 mL/min — ABNORMAL LOW (ref >60)
Glucose, Bld: 143 mg/dL — ABNORMAL HIGH (ref 70–99)
Potassium: 3.7 mmol/L (ref 3.5–5.1)
Sodium: 138 mmol/L (ref 135–145)

## 2020-12-31 LAB — OCCULT BLOOD X 1 CARD TO LAB, STOOL: Fecal Occult Bld: NEGATIVE

## 2020-12-31 LAB — TROPONIN I (HIGH SENSITIVITY)
Troponin I (High Sensitivity): 17 ng/L (ref <18)
Troponin I (High Sensitivity): 19 ng/L — ABNORMAL HIGH (ref <18)

## 2020-12-31 MED ORDER — ALBUTEROL SULFATE HFA 108 (90 BASE) MCG/ACT IN AERS
2.0000 | INHALATION_SPRAY | Freq: Four times a day (QID) | RESPIRATORY_TRACT | Status: DC | PRN
  Administered 2021-01-02: 2 via RESPIRATORY_TRACT
  Filled 2020-12-31: qty 6.7

## 2020-12-31 MED ORDER — ATORVASTATIN CALCIUM 40 MG PO TABS
40.0000 mg | ORAL_TABLET | Freq: Every evening | ORAL | Status: DC
  Administered 2021-01-01 – 2021-01-03 (×3): 40 mg via ORAL
  Filled 2020-12-31 (×3): qty 1

## 2020-12-31 MED ORDER — METOCLOPRAMIDE HCL 5 MG PO TABS: 5.0000 mg | ORAL_TABLET | Freq: Every day | ORAL | Status: DC | PRN

## 2020-12-31 MED ORDER — MIDODRINE HCL 5 MG PO TABS: 10.0000 mg | ORAL_TABLET | Freq: Once | ORAL | Status: DC | PRN

## 2020-12-31 MED ORDER — SODIUM CHLORIDE 0.9% FLUSH
3.0000 mL | Freq: Two times a day (BID) | INTRAVENOUS | Status: DC
  Administered 2021-01-01 – 2021-01-04 (×8): 3 mL via INTRAVENOUS

## 2020-12-31 MED ORDER — ALBUMIN HUMAN 25 % IV SOLN
25.0000 g | Freq: Once | INTRAVENOUS | Status: DC | PRN
  Filled 2020-12-31: qty 100

## 2020-12-31 MED ORDER — CALCIUM ACETATE (PHOS BINDER) 667 MG PO CAPS: 667.0000 mg | ORAL_CAPSULE | ORAL | Status: DC | PRN

## 2020-12-31 MED ORDER — ASPIRIN EC 325 MG PO TBEC
325.0000 mg | DELAYED_RELEASE_TABLET | Freq: Every day | ORAL | Status: DC
  Filled 2020-12-31: qty 1

## 2020-12-31 MED ORDER — INSULIN ASPART 100 UNIT/ML ~~LOC~~ SOLN
0.0000 [IU] | Freq: Three times a day (TID) | SUBCUTANEOUS | Status: DC
  Administered 2021-01-01: 1 [IU] via SUBCUTANEOUS
  Administered 2021-01-02: 2 [IU] via SUBCUTANEOUS
  Administered 2021-01-03: 1 [IU] via SUBCUTANEOUS

## 2020-12-31 MED ORDER — ACETAMINOPHEN 325 MG PO TABS
650.0000 mg | ORAL_TABLET | Freq: Four times a day (QID) | ORAL | Status: DC | PRN
  Administered 2021-01-01: 650 mg via ORAL
  Filled 2020-12-31: qty 2

## 2020-12-31 MED ORDER — CHLORHEXIDINE GLUCONATE CLOTH 2 % EX PADS
6.0000 | MEDICATED_PAD | Freq: Every day | CUTANEOUS | Status: DC
  Administered 2021-01-01: 6 via TOPICAL
  Filled 2020-12-31: qty 6

## 2020-12-31 MED ORDER — ACETAMINOPHEN 650 MG RE SUPP: 650.0000 mg | Freq: Four times a day (QID) | RECTAL | Status: DC | PRN

## 2020-12-31 MED ORDER — HYDROXYZINE HCL 25 MG PO TABS
25.0000 mg | ORAL_TABLET | Freq: Three times a day (TID) | ORAL | Status: DC | PRN
  Administered 2021-01-01: 25 mg via ORAL
  Filled 2020-12-31: qty 1

## 2020-12-31 MED ORDER — CARVEDILOL 12.5 MG PO TABS
12.5000 mg | ORAL_TABLET | Freq: Two times a day (BID) | ORAL | Status: DC
  Administered 2021-01-01 – 2021-01-04 (×5): 12.5 mg via ORAL
  Filled 2020-12-31 (×5): qty 1

## 2020-12-31 MED ORDER — FLUOXETINE HCL 20 MG PO CAPS
20.0000 mg | ORAL_CAPSULE | Freq: Every day | ORAL | Status: DC
  Administered 2021-01-01 – 2021-01-04 (×4): 20 mg via ORAL
  Filled 2020-12-31 (×4): qty 1

## 2020-12-31 MED ORDER — LOSARTAN POTASSIUM 50 MG PO TABS
50.0000 mg | ORAL_TABLET | Freq: Every day | ORAL | Status: DC
  Administered 2021-01-01: 50 mg via ORAL
  Filled 2020-12-31 (×2): qty 1

## 2020-12-31 MED ORDER — RENA-VITE PO TABS
1.0000 | ORAL_TABLET | Freq: Every day | ORAL | Status: DC
  Administered 2021-01-01 – 2021-01-03 (×4): 1 via ORAL
  Filled 2020-12-31 (×4): qty 1

## 2020-12-31 MED ORDER — GABAPENTIN 100 MG PO CAPS
100.0000 mg | ORAL_CAPSULE | Freq: Every day | ORAL | Status: DC
  Administered 2021-01-01 – 2021-01-03 (×4): 100 mg via ORAL
  Filled 2020-12-31 (×4): qty 1

## 2020-12-31 MED ORDER — HEPARIN SODIUM (PORCINE) 5000 UNIT/ML IJ SOLN
5000.0000 [IU] | Freq: Three times a day (TID) | INTRAMUSCULAR | Status: DC
  Administered 2021-01-01 – 2021-01-04 (×8): 5000 [IU] via SUBCUTANEOUS
  Filled 2020-12-31 (×9): qty 1

## 2020-12-31 NOTE — ED Notes (Signed)
Present with C/O short of breath starting three days ago.  Renal patient Dyalsis last done on Friday.  Cough non productive.  Worse when lying down.

## 2020-12-31 NOTE — ED Notes (Signed)
Patient desat to 81% after changing position.  Patient repositioned, increased to 6l/m [place], SpO2 86%. [facility] at bedside.  Placed on NRB SpO2 100%.

## 2020-12-31 NOTE — ED Notes (Signed)
ED Provider at bedside. 

## 2020-12-31 NOTE — ED Notes (Signed)
Hand-off report given to [facility].  

## 2020-12-31 NOTE — Progress Notes (Signed)
[facility] to [facility] transfer:   Patient with h/o HTN; HLD; CVA with R hemiparesis; anemia of chronic disease requiring occasional transfusion; chronic systolic CHF (EF [id number]); DM; COPD on home 2L O2; and ESRD on MWF HD presenting to [facility] with SOB.  She was previously admitted from 4/6-9 for volume overload associated with missed HD.  She has had difficulty tolerating full treatments due to anxiety and RLS, for which she was recently started on SSRI and Neurontin.  She has not missed recent HD (took off 2.5L) but has had weight gain and SOB, currently on 4.5L.  CXR with pulmonary edema.  Hgb 6.8, less than baseline and heme negative so likely needs transfusion.  Nephrology will plan to dialyze tonight.  Will observe on telemetry.  Carlyon Shadow, M.D.

## 2020-12-31 NOTE — Consult Note (Signed)
Renal Service Consult Note [facility]  Tina Gomez 12/31/2020 Sol Blazing, MD Requesting Physician: Dr. Lorin Mercy, Lenna Sciara.   Reason for Consult: ESRD pt w/ acute on chronic resp failure HPI: The patient is a 65 y.o. year-old w/ hx of COPD, ESRD on HD (x 6 mos), HFrEF, HTN, HL, DM2, RLS, anxiety / depression, hx CVA p[resented to ED today w/ worsening SOB x about 1 week. Wet cough , no purulent sputum or fevers, no pleuritic or other CP. +orthopnea. Last HD was Friday (MWF sched). Normally takes 2 L O2 at home, currently using 4.5 L. Had heart cath 3 wks ago w/ nonobstructive disease.  In ED at [facility] BP was 150/ 50, HR 80-90, RR 15- 22, afebrile. WBC 11 Hb 7.8  K 4.4, BUN 89 Creat 8.4. CXR showed acute on chronic IS opacities suspected pulm edema on background of COPD and developing fibrosis. Pt was transferred for admission to [facility]. We are asked to see for dialysis.   Pt seen in room. In no distress. Prefers FM O2 over nasal cannula. States has had 4-5 syncopal episodes on HD since starting 6 mos ago. Has had many trips to the ED from HD.  States her BP drops on dialysis often. Has some good sessions. Pt denies any fevers, chills or prod sputum. No abd pain, n/v/d. Mild ankle edema. +orthopnea.   ROS  denies CP  no joint pain   no HA  no blurry vision  no rash  no diarrhea  no nausea/ vomiting  no dysuria   Past Medical History  Past Medical History:  Diagnosis Date  . Anemia   . Chronic kidney disease   . COPD (chronic obstructive pulmonary disease) ([place]) 05/2019   no inhaler  . Diabetes mellitus without complication ([place])    no meds - diet controlled  . History of blood transfusion 09/2020   1 unit  . HLD (hyperlipidemia)   . Hypertension   . PONV (postoperative nausea and vomiting)   . Restless legs syndrome (RLS)   . Stroke [facility])    Past Surgical History  Past Surgical History:  Procedure Laterality Date  . AV FISTULA PLACEMENT Left 07/27/2020    Procedure: LEFT BRACHIO-BASILIC ARTERIOVENOUS (AV) FISTULA CREATION;  Surgeon: Cherre Robins, MD;  Location: [place];  Service: Vascular;  Laterality: Left;  . BASCILIC VEIN TRANSPOSITION Left 11/02/2020   Procedure: LEFT UPPER ARM SECOND STAGE BASILIC VEIN TRANSPOSITION;  Surgeon: Cherre Robins, MD;  Location: [place];  Service: Vascular;  Laterality: Left;  PERIPHERAL NERVE BLOCK  . CESAREAN SECTION     x 1  . ESOPHAGOGASTRODUODENOSCOPY (EGD) WITH PROPOFOL N/A 05/26/2020   Procedure: ESOPHAGOGASTRODUODENOSCOPY (EGD) WITH PROPOFOL;  Surgeon: Irene Shipper, MD;  Location: [facility] ENDOSCOPY;  Service: Endoscopy;  Laterality: N/A;  . INSERTION OF DIALYSIS CATHETER Right 07/27/2020   Procedure: INSERTION OF RIGHT INTERNAL JUGULAR TUNNELED DIALYSIS CATHETER;  Surgeon: Cherre Robins, MD;  Location: [place];  Service: Vascular;  Laterality: Right;  . IR FLUORO GUIDE CV LINE RIGHT  07/25/2020  . IR US GUIDE VASC ACCESS RIGHT  07/25/2020  . LEFT HEART CATH AND CORONARY ANGIOGRAPHY N/A 11/28/2020   Procedure: LEFT HEART CATH AND CORONARY ANGIOGRAPHY;  Surgeon: Troy Sine, MD;  Location: [place] CV LAB;  Service: Cardiovascular;  Laterality: N/A;  . TONSILLECTOMY    . UPPER GI ENDOSCOPY     growth removed from voice box  . WRIST SURGERY Left  ganglion cyst removal   Family History  Family History  Problem Relation Age of Onset  . Stroke Mother   . Hypertension Mother   . Hyperlipidemia Mother   . Hypertension Father   . Hyperlipidemia Father    Social History  reports that she has been smoking cigarettes. She has a 2.50 pack-year smoking history. She has never used smokeless tobacco. She reports that she does not drink alcohol and does not use drugs. Allergies  Allergies  Allergen Reactions  . Codeine Nausea And Vomiting  . Adhesive [Tape] Other (See Comments)    Tape breaks out the skin if it is left on for a lengthy period of time   Home medications Prior to Admission medications    Medication Sig Start Date End Date Taking? Authorizing Provider  albuterol (PROVENTIL) (2.5 MG/3ML) 0.083% nebulizer solution Take 3 mLs (2.5 mg total) by nebulization every 6 (six) hours as needed for wheezing or shortness of breath. 12/12/20   Charlott Rakes, MD  albuterol (VENTOLIN HFA) 108 (90 Base) MCG/ACT inhaler Inhale 2 puffs into the lungs every 6 (six) hours as needed for wheezing or shortness of breath. 12/12/20   Charlott Rakes, MD  aspirin EC 325 MG tablet Take 325 mg by mouth daily.    [provider]  atorvastatin (LIPITOR) 40 MG tablet TAKE 1 TABLET BY MOUTH DAILY AT 6 PM. 05/23/20   Charlott Rakes, MD  B Complex-C-Zn-Folic Acid (DIALYVITE 694 WITH ZINC) 0.8 MG TABS Take 1 tablet by mouth at bedtime. 08/12/20   [provider]  Blood Glucose Monitoring Suppl (BLOOD GLUCOSE METER) kit Use as instructed 11/01/13   Reyne Dumas, MD  Blood Glucose Monitoring Suppl (TRUE METRIX METER) DEVI 1 each by Does not apply route 3 (three) times daily before meals. 06/02/20   Charlott Rakes, MD  calcium acetate (PHOSLO) 667 MG capsule Take 1 capsule (667 mg total) by mouth with snacks. 12/16/20 01/15/21  Antonieta Pert, MD  carvedilol (COREG) 12.5 MG tablet TAKE 1 TABLET (12.5 MG TOTAL) BY MOUTH 2 (TWO) TIMES DAILY WITH A MEAL. 06/15/20 06/15/21  Charlott Rakes, MD  FLUoxetine (PROZAC) 20 MG capsule Take 1 capsule (20 mg total) by mouth daily. 12/12/20   Charlott Rakes, MD  gabapentin (NEURONTIN) 100 MG capsule Take 1 capsule (100 mg total) by mouth at bedtime. 12/12/20   Charlott Rakes, MD  glucose blood (TRUETEST TEST) test strip Use as instructed 06/02/20   Charlott Rakes, MD  hydrOXYzine (ATARAX/VISTARIL) 25 MG tablet Take 1 tablet (25 mg total) by mouth 3 (three) times daily as needed. 12/12/20   Charlott Rakes, MD  Lancets (FREESTYLE) lancets Use as instructed 11/01/13   Reyne Dumas, MD  lidocaine-prilocaine (EMLA) cream Apply 1 application topically See admin instructions. APPLY SMALL  AMOUNT TO ACCESS SITE (AVF) 1 TO 2 HOURS BEFORE DIALYSIS. COVER WITH OCCLUSIVE DRESSING (SARAN WRAP). 12/04/20   [provider]  losartan (COZAAR) 50 MG tablet Take 1 tablet (50 mg total) by mouth daily. 12/19/20 03/19/21  Charlott Rakes, MD  metoCLOPramide (REGLAN) 5 MG tablet TAKE 1 TABLET BY MOUTH FOUR TIMES DAILY, BEFORE MEALS AND AT BEDTIME. Patient taking differently: Take 5 mg by mouth daily as needed for nausea or vomiting. 11/13/20 11/13/21  Willia Craze, NP  TRUEplus Lancets 28G MISC 1 each by Does not apply route 3 (three) times daily before meals. 06/02/20   Charlott Rakes, MD  amLODipine (NORVASC) 10 MG tablet Take 1 tablet (10 mg total) by mouth daily. 08/01/20  11/09/20  Terrilee Croak, MD  hydrALAZINE (APRESOLINE) 25 MG tablet Take 1 tablet (25 mg total) by mouth every 8 (eight) hours. 07/31/20 11/09/20  Terrilee Croak, MD  omeprazole (PRILOSEC) 20 MG capsule TAKE 1 CAPSULE (20 MG TOTAL) BY MOUTH 2 (TWO) TIMES DAILY BEFORE A MEAL. 08/01/20 08/22/20  Charlott Rakes, MD     Vitals:   12/31/20 1715 12/31/20 1747 12/31/20 1832 12/31/20 1946  BP:  (!) 158/64 (!) 182/72 (!) 170/75  Pulse: 65 63 67 65  Resp: _0 (!) 26  Temp:   98.8 F (37.1 C)   TempSrc:   Oral   SpO2: 94% 90% 90% 100%   Exam Gen alert, no distress No rash, cyanosis or gangrene Sclera anicteric, throat clear  No jvd or bruits Chest scattered rales bilat bases, no wheezing or bronch BS RRR no MRG Abd soft ntnd no mass or ascites +bs GU defer MS no joint effusions or deformity Ext trace pretib edema bilat , no wounds or ulcers Neuro is alert, Ox 3 , nf RIJ TDC/ LUA AVF+bruit       Home meds:  - asa 325/ lipitor 40  - losartan 50 qd/ coreg 12.5 bid  - phoslo 1 ac  - reglan 5 qid prn  - prozac 20 qd/ neurontin 100 hs  - prn's/ vitamins/ supplements    OP HD: MWF AF  3h 55mn  3K/2.5 bath  AVF LUA / RIJ TDC  Hep 2000 (from 12/16/20 admit)  - pending     Assessment/ Plan: 1. Dyspnea -  acute on chronic resp failure, on home O2 w/ hx COPD, HFrEF and chronic anemia.  Suspect vol excess/ pulm edema contributing. Last admit early April she improved w/ HD/ vol removal. Plan is for HD tonight.  2. ESRD - on HD MWF schedule. Last HD Friday. Has had 4-5 syncopal episodes on HD since starting 6 mos ago. And per last admit there was noted a hx of  sig anxiety and RLS and signs off early most treatments.  3. Hypertension/volume:BPelevated now, usually drops w/ HD. Consider midodrine pre/ during HD. HD tonight as above. 4. Anemia ckd - get records in am. Hb 6.8 w/ vol excess. Repeat in am. No active bleeding.  5. Metabolic bone disease: cont binders, get records in am for others 6. Nutrition:Alb low,continue proteinsupplements. 7. HFrEF (EF 30%): Recent LHC with non-obstructive CAD  8. COPD:per primary. Home O2 on discharge.   9. T2DM/gastroparesis  RKelly Splinter MD 12/31/2020, 8:00 PM  Recent Labs  Lab 12/31/20 1425  WBC 7.6  HGB 6.8*   Recent Labs  Lab 12/31/20 1425  K 3.7  BUN 52*  CREATININE 5.64*  CALCIUM 7.9*

## 2020-12-31 NOTE — ED Provider Notes (Signed)
[facility] Provider Note   CSN: [id number] Arrival date & time: 12/31/20  1354     History SOB   Tina Gomez is a 65 y.o. female with past medical history significant for CKD, COPD, diabetes, CVA who presents for evaluation shortness of breath.  Began 1 week ago.  Worsened yesterday evening.  Had dialysis 2 days ago on Friday.  Normally on 2 L of oxygen at baseline however is now requiring 4.5 L here. Checked her O2 at home and was in the 70's. Had a cardiac cath 3 weeks ago had nonobstructive CAD.  She was seen by cardiology 3 days ago, told needed medical managment.  Sleeps with 3 pillows at night.  Admits to PND and orthopnea.  Patient states she does not normally retain fluid in her lower extremities however quickly gets pulmonary edema, and edema to abdomen.  Removed her normal 2 L on Friday.  States also told that she was anemic and her hemoglobin was 7 when she went to dialysis on Friday.  Per daughter in room was told that her hemoglobin is normally 9 when at dialysis.  She has needed transfusions previously.  She denies chest pain, hemoptysis, fever, chills, nausea, vomiting, abdominal pain, diarrhea, dysuria.  Normally urinates once daily in the morning. Chronic dark stool at baseline, on iron. No change in color. No difficulty with urinating today.  Was told she possibly had pneumonia.  She has a cough which is productive of sputum.  No recent COVID exposures.  No one around her has been sick.  Denies additional aggravating or alleviating factors  History obtained from patient past medical records.  Interpreter used.  HPI     Past Medical History:  Diagnosis Date  . Anemia   . Chronic kidney disease   . COPD (chronic obstructive pulmonary disease) ([place]) 05/2019   no inhaler  . Diabetes mellitus without complication ([place])    no meds - diet controlled  . History of blood transfusion 09/2020   1 unit  . HLD (hyperlipidemia)   . Hypertension    . PONV (postoperative nausea and vomiting)   . Restless legs syndrome (RLS)   . Stroke [facility])     Patient Active Problem List   Diagnosis Date Noted  . Volume overload 12/13/2020  . CAD (coronary artery disease) 12/13/2020  . Acute respiratory failure with hypoxia ([place]) 12/13/2020  . Shortness of breath   . Acute exacerbation of CHF (congestive heart failure) ([place]) 11/07/2020  . Protein-calorie malnutrition, severe 07/28/2020  . ARF (acute renal failure) ([place]) 07/23/2020  . Diabetic gastroparesis ([place])   . Abnormal finding on GI tract imaging   . H. pylori infection   . Diverticulitis 05/24/2020  . History of stroke 05/24/2020  . Anemia 05/24/2020  . Nausea and vomiting 05/24/2020  . AKI (acute kidney injury) ([place]) 05/24/2020  . Dehydration 05/24/2020  . Hypomagnesemia 05/24/2020  . Hypokalemia 05/24/2020  . Tobacco abuse 05/24/2020  . Emphysema of lung ([place]) 05/24/2020  . Psoriasis 02/14/2016  . Type 2 diabetes mellitus with diabetic neuropathy, unspecified ([place]) 01/18/2014  . Essential hypertension, benign 01/18/2014  . GERD (gastroesophageal reflux disease) 01/18/2014  . Hyperlipidemia 01/18/2014  . Cerebral infarction ([place]) 10/22/2013  . 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 AV FISTULA PLACEMENT Left 07/27/2020   Procedure: LEFT BRACHIO-BASILIC ARTERIOVENOUS (AV) FISTULA CREATION;  Surgeon: Cherre Robins, MD;  Location: [place];  Service: Vascular;  Laterality: Left;  . BASCILIC VEIN TRANSPOSITION Left 11/02/2020   Procedure: LEFT UPPER ARM SECOND STAGE BASILIC VEIN TRANSPOSITION;  Surgeon: Cherre Robins, MD;  Location: [place];  Service: Vascular;  Laterality: Left;  PERIPHERAL NERVE BLOCK  . CESAREAN SECTION     x 1  . ESOPHAGOGASTRODUODENOSCOPY (EGD) WITH PROPOFOL N/A 05/26/2020   Procedure: ESOPHAGOGASTRODUODENOSCOPY (EGD) WITH PROPOFOL;  Surgeon: Irene Shipper, MD;  Location: [facility] ENDOSCOPY;  Service: Endoscopy;   Laterality: N/A;  . INSERTION OF DIALYSIS CATHETER Right 07/27/2020   Procedure: INSERTION OF RIGHT INTERNAL JUGULAR TUNNELED DIALYSIS CATHETER;  Surgeon: Cherre Robins, MD;  Location: [place];  Service: Vascular;  Laterality: Right;  . IR FLUORO GUIDE CV LINE RIGHT  07/25/2020  . IR US GUIDE VASC ACCESS RIGHT  07/25/2020  . LEFT HEART CATH AND CORONARY ANGIOGRAPHY N/A 11/28/2020   Procedure: LEFT HEART CATH AND CORONARY ANGIOGRAPHY;  Surgeon: Troy Sine, MD;  Location: [place] CV LAB;  Service: Cardiovascular;  Laterality: N/A;  . TONSILLECTOMY    . UPPER GI ENDOSCOPY     growth removed from voice box  . WRIST SURGERY Left    ganglion cyst removal     OB History   No obstetric history on file.     Family History  Problem Relation Age of Onset  . Stroke Mother   . Hypertension Mother   . Hyperlipidemia Mother   . Hypertension Father   . Hyperlipidemia Father     Social History   Tobacco Use  . Smoking status: Current Every Day Smoker    Packs/day: 0.25    Years: 10.00    Pack years: 2.50    Types: Cigarettes  . Smokeless tobacco: Never Used  . Tobacco comment: 5 cigs daily  Vaping Use  . Vaping Use: Never used  Substance Use Topics  . Alcohol use: No  . Drug use: No    Home Medications Prior to Admission medications   Medication Sig Start Date End Date Taking? Authorizing Provider  albuterol (PROVENTIL) (2.5 MG/3ML) 0.083% nebulizer solution Take 3 mLs (2.5 mg total) by nebulization every 6 (six) hours as needed for wheezing or shortness of breath. 12/12/20   Charlott Rakes, MD  albuterol (VENTOLIN HFA) 108 (90 Base) MCG/ACT inhaler Inhale 2 puffs into the lungs every 6 (six) hours as needed for wheezing or shortness of breath. 12/12/20   Charlott Rakes, MD  aspirin EC 325 MG tablet Take 325 mg by mouth daily.    [provider]  atorvastatin (LIPITOR) 40 MG tablet TAKE 1 TABLET BY MOUTH DAILY AT 6 PM. 05/23/20   Charlott Rakes, MD  B  Complex-C-Zn-Folic Acid (DIALYVITE 761 WITH ZINC) 0.8 MG TABS Take 1 tablet by mouth at bedtime. 08/12/20   [provider]  Blood Glucose Monitoring Suppl (BLOOD GLUCOSE METER) kit Use as instructed 11/01/13   Reyne Dumas, MD  Blood Glucose Monitoring Suppl (TRUE METRIX METER) DEVI 1 each by Does not apply route 3 (three) times daily before meals. 06/02/20   Charlott Rakes, MD  calcium acetate (PHOSLO) 667 MG capsule Take 1 capsule (667 mg total) by mouth with snacks. 12/16/20 01/15/21  Antonieta Pert, MD  carvedilol (COREG) 12.5 MG tablet TAKE 1 TABLET (12.5 MG TOTAL) BY MOUTH 2 (TWO) TIMES DAILY WITH A MEAL. 06/15/20 06/15/21  Charlott Rakes, MD  FLUoxetine (PROZAC) 20 MG capsule Take 1 capsule (20 mg total) by mouth daily. 12/12/20   Charlott Rakes, MD  gabapentin (NEURONTIN) 100  MG capsule Take 1 capsule (100 mg total) by mouth at bedtime. 12/12/20   Charlott Rakes, MD  glucose blood (TRUETEST TEST) test strip Use as instructed 06/02/20   Charlott Rakes, MD  hydrOXYzine (ATARAX/VISTARIL) 25 MG tablet Take 1 tablet (25 mg total) by mouth 3 (three) times daily as needed. 12/12/20   Charlott Rakes, MD  Lancets (FREESTYLE) lancets Use as instructed 11/01/13   Reyne Dumas, MD  lidocaine-prilocaine (EMLA) cream Apply 1 application topically See admin instructions. APPLY SMALL AMOUNT TO ACCESS SITE (AVF) 1 TO 2 HOURS BEFORE DIALYSIS. COVER WITH OCCLUSIVE DRESSING (SARAN WRAP). 12/04/20   [provider]  losartan (COZAAR) 50 MG tablet Take 1 tablet (50 mg total) by mouth daily. 12/19/20 03/19/21  Charlott Rakes, MD  metoCLOPramide (REGLAN) 5 MG tablet TAKE 1 TABLET BY MOUTH FOUR TIMES DAILY, BEFORE MEALS AND AT BEDTIME. Patient taking differently: Take 5 mg by mouth daily as needed for nausea or vomiting. 11/13/20 11/13/21  Willia Craze, NP  TRUEplus Lancets 28G MISC 1 each by Does not apply route 3 (three) times daily before meals. 06/02/20   Charlott Rakes, MD  amLODipine (NORVASC) 10 MG tablet  Take 1 tablet (10 mg total) by mouth daily. 08/01/20 11/09/20  Terrilee Croak, MD  hydrALAZINE (APRESOLINE) 25 MG tablet Take 1 tablet (25 mg total) by mouth every 8 (eight) hours. 07/31/20 11/09/20  Terrilee Croak, MD  omeprazole (PRILOSEC) 20 MG capsule TAKE 1 CAPSULE (20 MG TOTAL) BY MOUTH 2 (TWO) TIMES DAILY BEFORE A MEAL. 08/01/20 08/22/20  Charlott Rakes, MD    Allergies    Codeine and Adhesive [tape]  Review of Systems   Review of Systems  Constitutional: Negative.   HENT: Negative.   Respiratory: Positive for cough and shortness of breath.   Cardiovascular: Negative.   Gastrointestinal: Negative.   Genitourinary: Negative.   Musculoskeletal: Negative.   Skin: Negative.   Neurological: Positive for weakness (Generalized).  All other systems reviewed and are negative.   Physical Exam Updated Vital Signs BP (!) 176/57   Pulse 62   Temp 98.5 F (36.9 C) (Oral)   Resp 18   LMP  (LMP Unknown)   SpO2 93%   Physical Exam Vitals and nursing note reviewed.  Constitutional:      General: She is not in acute distress.    Appearance: She is well-developed. She is ill-appearing (Chronically ill appearing). She is not toxic-appearing.  HENT:     Head: Normocephalic and atraumatic.  Eyes:     Pupils: Pupils are equal, round, and reactive to light.  Cardiovascular:     Rate and Rhythm: Normal rate.     Pulses: Normal pulses.     Heart sounds: Murmur heard.    Pulmonary:     Effort: Tachypnea present. No respiratory distress.     Breath sounds: Rales present.     Comments: Minimal tachypnea on 4 L via nasal cannula, crackles at bases bilaterally Chest:     Comments: Equal rise and fall to chest wall Dialysis catheter to right chest wall Abdominal:     General: Bowel sounds are normal. There is no distension.     Palpations: Abdomen is soft.     Comments: Soft, nontender without rebound or guarding  Genitourinary:    Comments: Light brown stool in vault. Non  tender Musculoskeletal:        General: Normal range of motion.     Cervical back: Normal range of motion.     Right  lower leg: No tenderness. No edema.     Left lower leg: No tenderness. No edema.     Comments: No bony tenderness.  Moves all 4 extremities without difficulty.  Compartments soft Dialysis fistula to left upper extremity  Skin:    General: Skin is warm and dry.     Capillary Refill: Capillary refill takes less than 2 seconds.     Comments: No rashes or lesions  Neurological:     Mental Status: She is alert.     Comments: Cranial nerves II through grossly intact    ED Results / Procedures / Treatments   Labs (all labs ordered are listed, but only abnormal results are displayed) Labs Reviewed  CBC WITH DIFFERENTIAL/PLATELET - Abnormal; Notable for the following components:      Result Value   RBC 2.27 (*)    Hemoglobin 6.8 (*)    HCT 21.4 (*)    RDW 16.5 (*)    All other components within normal limits  BASIC METABOLIC PANEL - Abnormal; Notable for the following components:   Glucose, Bld 143 (*)    BUN 52 (*)    Creatinine, Ser 5.64 (*)    Calcium 7.9 (*)    GFR, Estimated 8 (*)    Anion gap 16 (*)    All other components within normal limits  RESP PANEL BY RT-PCR (FLU A&B, COVID) ARPGX2  OCCULT BLOOD X 1 CARD TO LAB, STOOL  TROPONIN I (HIGH SENSITIVITY)  TROPONIN I (HIGH SENSITIVITY)    EKG EKG Interpretation  Date/Time:  Sunday December 31 2020 14:05:28 EDT Ventricular Rate:  65 PR Interval:  266 QRS Duration: 91 QT Interval:  475 QTC Calculation: 494 R Axis:   58 Text Interpretation: Sinus rhythm Prolonged PR interval Probable anteroseptal infarct, old Confirmed by Lennice Sites [phone]) on 12/31/2020 2:08:43 PM   Radiology DG Chest Portable 1 View  Result Date: 12/31/2020 CLINICAL DATA:  Shortness of breath.  End-stage renal disease. EXAM: PORTABLE CHEST 1 VIEW COMPARISON:  December 15, 2020 FINDINGS: The right-sided double lumen catheter is in good  position. No pneumothorax. Mild increased interstitial markings without overt edema. Small bilateral pleural effusions were not seen previously. No other abnormalities. IMPRESSION: 1. Cardiomegaly, mild pulmonary venous congestion/edema, and small pleural effusions. Electronically Signed   By: Dorise Bullion III M.D   On: 12/31/2020 14:45    Procedures .Critical Care Performed by: Nettie Elm, PA-C Authorized by: Nettie Elm, PA-C   Critical care provider statement:    Critical care time (minutes):  45   Critical care was necessary to treat or prevent imminent or life-threatening deterioration of the following conditions:  Respiratory failure, renal failure, cardiac failure and metabolic crisis   Critical care was time spent personally by me on the following activities:  Discussions with consultants, evaluation of patient's response to treatment, examination of patient, ordering and performing treatments and interventions, ordering and review of laboratory studies, ordering and review of radiographic studies, pulse oximetry, re-evaluation of patient's condition, obtaining history from patient or surrogate and review of old charts     LEFT heart cath 11/28/20   Ost Cx lesion is 20% stenosed.  Mid LAD-1 lesion is 35% stenosed.  Mid LAD-2 lesion is 30% stenosed.  Prox RCA-1 lesion is 70% stenosed.  Prox RCA-2 lesion is 50% stenosed.  Mid RCA lesion is 20% stenosed.  Dist RCA lesion is 20% stenosed.  Mid Cx lesion is 20% stenosed.   There is multivessel coronary calcification  involving all coronary arteries.  The LAD had smooth 30 to 40% narrowing between the first and second diagonal vessel with 30% narrowing beyond the second diagonal vessel; the circumflex vessel had mild 20% ostial stenosis, and had 20% mid stenosis with minimal luminal irregularity throughout; the right coronary artery had proximal 65- 70% stenosis followed by 50% mid stenosis with segmental 20 and  30% mid distal stenoses with a dominant PDA and PLA vessel.  Significant blood pressure differential between the central aorta/ RFA pressures   and a blood pressure cuff on the left lower extremity with approximately 782 mm systolic differential.  RECOMMENDATION: The patient's global LV dysfunction with EF of 30% is out of proportion to her CAD.  She has not been on significant anti-ischemic medication and has been on carvedilol in addition to losartan.  Will add amlodipine which would be helpful both for blood pressure control as well as potential anti-ischemic benefit.   Medications Ordered in ED Medications  Chlorhexidine Gluconate Cloth 2 % PADS 6 each (has no administration in time range)    ED Course  I have reviewed the triage vital signs and the nursing notes.  Pertinent labs & imaging results that were available during my care of the patient were reviewed by me and considered in my medical decision making (see chart for details).  66 year old, ESRD patient presents for evaluation of shortness of breath.  No recently missed dialysis sessions.  Wells criteria low risk.  Clinically she sounds fluid overloaded.  No significant edema to lower extremities.  She is up from her dry weight of 75.5kg, here today 80 kg.  Symptoms not seem consistent with ACS, recent heart cath less than 1 month ago showed nonocclusive CAD.  Plan on labs, imaging and reassess  Labs and imaging personally reviewed and interpreted:  CBC without leukocytosis, hemoglobin 6.8, lower than baseline, will need to be transfused, hold on labs as they will need to be done at transfusing facility. BMP BUN 52, creatinine 5.64, gap 16 DG chest with pulmonary edema, small pleural effusion EKG without ischemia COVID negative Trop 17 Occult negative  Patient reassessed. Comfortable on 4.5L. No respiratory distress.  Discussed labs and imaging with patient and family in room.  Will need admission  CONSULT with Dr.  Melvia Heaps with Nephrology who agrees with dialysis  CONSULT with Dr. Lorin Mercy with [facility] who agrees to accept patient in transfer for admission.  Will need blood transfusion once arrived at [facility] for symptomatic anemia  The patient appears reasonably stabilized for admission considering the current resources, flow, and capabilities available in the ED at this time, and I doubt any other [facility] requiring further screening and/or treatment in the ED prior to admission.  Discussed with attending, Dr. Ronnald Nian who agrees with above treatment, plan to admit.     MDM Rules/Calculators/A&P                           Final Clinical Impression(s) / ED Diagnoses Final diagnoses:  ESRD (end stage renal disease) ([place])  Acute pulmonary edema ([place])  Acute respiratory failure with hypoxia ([facility])  Symptomatic anemia    Rx / DC Orders ED Discharge Orders    None       Orlanda Frankum A, PA-C 12/31/20 [place], [place], DO 01/01/21 [phone]

## 2020-12-31 NOTE — H&P (Signed)
History and Physical   Tina Gomez YQM:[id number] DOB: 05-25-1956 DOA: 12/31/2020  PCP: Charlott Rakes, MD   Patient coming from: Home  Chief Complaint: Shortness of breath and increased weight.  HPI: Tina Gomez is a 65 y.o. female with medical history significant of ESRD on HD Monday Wednesday Friday, CHF, COPD, CVA, anemia of chronic disease, CAD, hypertension, GERD, hyperlipidemia, diabetes who presents with shortness of breath and weight gain.  Patient reports she has had ongoing worsening shortness of breath for about the past week.  She states that she has some issues with finishing her full sessions of dialysis due to soft blood pressure, restless leg syndrome and anxiety can also contribute per reports.  She has not really missed any recent dialysis sessions however does have to in them early.  Her last session was on Friday.  She also reports having to use increased oxygen at home she is typically on 2 L and had to increase this to 4.5 L. States she get abdominal edema but no LE edema.  Of note, she had a heart cath 3 weeks ago which showed nonobstructive CAD. She reports nonproductive cough. Reports orthopnea. She denies fevers, chills, chest pain, abdominal pain, constipation, diarrhea, nausea, vomiting.  ED Course: Vital signs in the ED significant for blood pressure in the 528U to 132G systolic.  Requiring 4 L to maintain saturations.  Lab work-up showed BMP with creatinine 5.64 consistent with ESRD, BUN 52, gap of 16.  Calcium 7.9.  CBC showed hemoglobin of 6.8 down from baseline of around 8.  Troponin flat at 17 and then 19 on repeat.  FOBT was negative.  Respiratory panel for flu and COVID was negative.  Chest x-ray showed cardiomegaly and pulmonary venous congestion.  Nephrology was consulted who agree with volume overload and plan for dialysis tonight.  Review of Systems: As per HPI otherwise all other systems reviewed and are negative.  Past Medical History:  Diagnosis  Date  . Anemia   . Chronic kidney disease   . COPD (chronic obstructive pulmonary disease) ([place]) 05/2019   no inhaler  . Diabetes mellitus without complication ([place])    no meds - diet controlled  . History of blood transfusion 09/2020   1 unit  . HLD (hyperlipidemia)   . Hypertension   . PONV (postoperative nausea and vomiting)   . Restless legs syndrome (RLS)   . Stroke [facility])     Past Surgical History:  Procedure Laterality Date  . AV FISTULA PLACEMENT Left 07/27/2020   Procedure: LEFT BRACHIO-BASILIC ARTERIOVENOUS (AV) FISTULA CREATION;  Surgeon: Cherre Robins, MD;  Location: [place];  Service: Vascular;  Laterality: Left;  . BASCILIC VEIN TRANSPOSITION Left 11/02/2020   Procedure: LEFT UPPER ARM SECOND STAGE BASILIC VEIN TRANSPOSITION;  Surgeon: Cherre Robins, MD;  Location: [place];  Service: Vascular;  Laterality: Left;  PERIPHERAL NERVE BLOCK  . CESAREAN SECTION     x 1  . ESOPHAGOGASTRODUODENOSCOPY (EGD) WITH PROPOFOL N/A 05/26/2020   Procedure: ESOPHAGOGASTRODUODENOSCOPY (EGD) WITH PROPOFOL;  Surgeon: Irene Shipper, MD;  Location: [facility] ENDOSCOPY;  Service: Endoscopy;  Laterality: N/A;  . INSERTION OF DIALYSIS CATHETER Right 07/27/2020   Procedure: INSERTION OF RIGHT INTERNAL JUGULAR TUNNELED DIALYSIS CATHETER;  Surgeon: Cherre Robins, MD;  Location: [place];  Service: Vascular;  Laterality: Right;  . IR FLUORO GUIDE CV LINE RIGHT  07/25/2020  . IR US GUIDE VASC ACCESS RIGHT  07/25/2020  . LEFT HEART CATH AND CORONARY ANGIOGRAPHY N/A  11/28/2020   Procedure: LEFT HEART CATH AND CORONARY ANGIOGRAPHY;  Surgeon: Troy Sine, MD;  Location: [place] CV LAB;  Service: Cardiovascular;  Laterality: N/A;  . TONSILLECTOMY    . UPPER GI ENDOSCOPY     growth removed from voice box  . WRIST SURGERY Left    ganglion cyst removal    Social History  reports that she has been smoking cigarettes. She has a 2.50 pack-year smoking history. She has never used smokeless tobacco. She  reports that she does not drink alcohol and does not use drugs.  Allergies  Allergen Reactions  . Codeine Nausea And Vomiting  . Adhesive [Tape] Other (See Comments)    Tape breaks out the skin if it is left on for a lengthy period of time    Family History  Problem Relation Age of Onset  . Stroke Mother   . Hypertension Mother   . Hyperlipidemia Mother   . Hypertension Father   . Hyperlipidemia Father   Reviewed on admission  Prior to Admission medications   Medication Sig Start Date End Date Taking? Authorizing Provider  albuterol (PROVENTIL) (2.5 MG/3ML) 0.083% nebulizer solution Take 3 mLs (2.5 mg total) by nebulization every 6 (six) hours as needed for wheezing or shortness of breath. 12/12/20   Charlott Rakes, MD  albuterol (VENTOLIN HFA) 108 (90 Base) MCG/ACT inhaler Inhale 2 puffs into the lungs every 6 (six) hours as needed for wheezing or shortness of breath. 12/12/20   Charlott Rakes, MD  aspirin EC 325 MG tablet Take 325 mg by mouth daily.    [provider]  atorvastatin (LIPITOR) 40 MG tablet TAKE 1 TABLET BY MOUTH DAILY AT 6 PM. 05/23/20   Charlott Rakes, MD  B Complex-C-Zn-Folic Acid (DIALYVITE 500 WITH ZINC) 0.8 MG TABS Take 1 tablet by mouth at bedtime. 08/12/20   [provider]  Blood Glucose Monitoring Suppl (BLOOD GLUCOSE METER) kit Use as instructed 11/01/13   Reyne Dumas, MD  Blood Glucose Monitoring Suppl (TRUE METRIX METER) DEVI 1 each by Does not apply route 3 (three) times daily before meals. 06/02/20   Charlott Rakes, MD  calcium acetate (PHOSLO) 667 MG capsule Take 1 capsule (667 mg total) by mouth with snacks. 12/16/20 01/15/21  Antonieta Pert, MD  carvedilol (COREG) 12.5 MG tablet TAKE 1 TABLET (12.5 MG TOTAL) BY MOUTH 2 (TWO) TIMES DAILY WITH A MEAL. 06/15/20 06/15/21  Charlott Rakes, MD  FLUoxetine (PROZAC) 20 MG capsule Take 1 capsule (20 mg total) by mouth daily. 12/12/20   Charlott Rakes, MD  gabapentin (NEURONTIN) 100 MG capsule Take 1 capsule  (100 mg total) by mouth at bedtime. 12/12/20   Charlott Rakes, MD  glucose blood (TRUETEST TEST) test strip Use as instructed 06/02/20   Charlott Rakes, MD  hydrOXYzine (ATARAX/VISTARIL) 25 MG tablet Take 1 tablet (25 mg total) by mouth 3 (three) times daily as needed. 12/12/20   Charlott Rakes, MD  Lancets (FREESTYLE) lancets Use as instructed 11/01/13   Reyne Dumas, MD  lidocaine-prilocaine (EMLA) cream Apply 1 application topically See admin instructions. APPLY SMALL AMOUNT TO ACCESS SITE (AVF) 1 TO 2 HOURS BEFORE DIALYSIS. COVER WITH OCCLUSIVE DRESSING (SARAN WRAP). 12/04/20   [provider]  losartan (COZAAR) 50 MG tablet Take 1 tablet (50 mg total) by mouth daily. 12/19/20 03/19/21  Charlott Rakes, MD  metoCLOPramide (REGLAN) 5 MG tablet TAKE 1 TABLET BY MOUTH FOUR TIMES DAILY, BEFORE MEALS AND AT BEDTIME. Patient taking differently: Take 5 mg by mouth  daily as needed for nausea or vomiting. 11/13/20 11/13/21  Willia Craze, NP  TRUEplus Lancets 28G MISC 1 each by Does not apply route 3 (three) times daily before meals. 06/02/20   Charlott Rakes, MD  amLODipine (NORVASC) 10 MG tablet Take 1 tablet (10 mg total) by mouth daily. 08/01/20 11/09/20  Terrilee Croak, MD  hydrALAZINE (APRESOLINE) 25 MG tablet Take 1 tablet (25 mg total) by mouth every 8 (eight) hours. 07/31/20 11/09/20  Terrilee Croak, MD  omeprazole (PRILOSEC) 20 MG capsule TAKE 1 CAPSULE (20 MG TOTAL) BY MOUTH 2 (TWO) TIMES DAILY BEFORE A MEAL. 08/01/20 08/22/20  Charlott Rakes, MD    Physical Exam: Vitals:   12/31/20 2228 12/31/20 2232 12/31/20 2258 12/31/20 2328  BP: (!) 159/58  (!) 159/51 (!) 144/68  Pulse: 61 63 62 62  Resp: _0 (!) 22  Temp:      TempSrc:      SpO2: 92% 92% (!) 87% 93%  Weight:      Height:       Physical Exam Constitutional:      General: She is not in acute distress.    Appearance: Normal appearance. She is obese.  HENT:     Head: Normocephalic and atraumatic.     Mouth/Throat:      Mouth: Mucous membranes are moist.     Pharynx: Oropharynx is clear.  Eyes:     Extraocular Movements: Extraocular movements intact.     Pupils: Pupils are equal, round, and reactive to light.  Cardiovascular:     Rate and Rhythm: Normal rate and regular rhythm.     Pulses: Normal pulses.     Heart sounds: Normal heart sounds.  Pulmonary:     Effort: Pulmonary effort is normal. No respiratory distress.     Breath sounds: Rales present.  Abdominal:     General: Bowel sounds are normal. There is no distension.     Palpations: Abdomen is soft.     Tenderness: There is no abdominal tenderness.  Musculoskeletal:        General: No swelling or deformity.  Skin:    General: Skin is warm and dry.     Comments: Dialysis catheter in place in right chest.  Neurological:     General: No focal deficit present.     Mental Status: Mental status is at baseline.    Labs on Admission: I have personally reviewed following labs and imaging studies  CBC: Recent Labs  Lab 12/31/20 1425  WBC 7.6  NEUTROABS 5.3  HGB 6.8*  HCT 21.4*  MCV 94.3  PLT 283    Basic Metabolic Panel: Recent Labs  Lab 12/31/20 1425  NA 138  K 3.7  CL 98  CO2 24  GLUCOSE 143*  BUN 52*  CREATININE 5.64*  CALCIUM 7.9*    GFR: Estimated Creatinine Clearance: 10 mL/min (A) (by C-G formula based on SCr of 5.64 mg/dL (H)).  Liver Function Tests: No results for input(s): AST, ALT, ALKPHOS, BILITOT, PROT, ALBUMIN in the last 168 hours.  Urine analysis:    Component Value Date/Time   COLORURINE YELLOW 07/23/2020 0146   APPEARANCEUR CLOUDY (A) 07/23/2020 0146   LABSPEC 1.010 07/23/2020 0146   PHURINE 5.0 07/23/2020 0146   GLUCOSEU 50 (A) 07/23/2020 0146   HGBUR MODERATE (A) 07/23/2020 0146   BILIRUBINUR NEGATIVE 07/23/2020 0146   KETONESUR NEGATIVE 07/23/2020 0146   PROTEINUR >=300 (A) 07/23/2020 0146   UROBILINOGEN 1.0 10/22/2013 1626   NITRITE NEGATIVE 07/23/2020  [place]  07/23/2020 0146    Radiological Exams on Admission: DG Chest Portable 1 View  Result Date: 12/31/2020 CLINICAL DATA:  Shortness of breath.  End-stage renal disease. EXAM: PORTABLE CHEST 1 VIEW COMPARISON:  December 15, 2020 FINDINGS: The right-sided double lumen catheter is in good position. No pneumothorax. Mild increased interstitial markings without overt edema. Small bilateral pleural effusions were not seen previously. No other abnormalities. IMPRESSION: 1. Cardiomegaly, mild pulmonary venous congestion/edema, and small pleural effusions. Electronically Signed   By: Dorise Bullion III M.D   On: 12/31/2020 14:45   EKG: Independently reviewed.  Sinus rhythm at 65 bpm.  Nonspecific T wave flattening in multiple leads.  Similar to previous.  Assessment/Plan Principal Problem:   Volume overload Active Problems:   Type 2 diabetes mellitus with diabetic neuropathy, unspecified (HCC)   Essential hypertension, benign   GERD (gastroesophageal reflux disease)   Anemia   Emphysema of lung (HCC)   CHF (congestive heart failure) (HCC)   CAD (coronary artery disease)  Volume overload ESRD on HD Monday Wednesday Friday > Has difficulty finishing her full dialysis session so she does not miss sessions.  She has had increasing shortness of breath and weight gain for the past week.  Last dialysis session was on Friday. > Noted to have edema on exam and chest x-ray with pulmonary venous congestion and cardiomegaly. > Electrolytes stable in ED.  Hemoglobin as below was noted to be 6.8 however this may be a delusional component given her volume overload. - Appreciate nephrology recommendations and assistance with this patient -Continue home PhosLo, Rena-Vite  Anemia of chronic disease > Chronic anemia with hemoglobin typically in the sevens to eights has previously required transfusion.  Hemoglobin in the ED was 6.8 with previous of 7.9. > This acute on chronic anemia is in the setting of volume overload  will get a type and screen and recheck hemoglobin after HD to monitor for improvement. - Type and screen - Recheck hemoglobin after dialysis, given CHF goal hemoglobin should be ~8 - If transfusion is needed may need to coordinate with nephrology to do during next dialysis session  CHF > Last echo in March with EF 30%, G2 DD, mildly reduced RVEF > Is volume over loaded as above, volume is managed with dialysis - Proceeding with HD for volume control - Monitor I's and O's and weights - Continue home Coreg, losartan (states she does not take losartan on dialysis days)  CAD Hyperlipidemia Hypertension > Recent cath showing nonobstructive CAD - Continue home Coreg, losartan, atorvastatin, aspirin  Diabetes - SSI  COPD - Continue home albuterol   History of CVA - Continue home aspirin  DVT prophylaxis: Heparin  Code Status:   Full  Family Communication:  None on admission Disposition Plan:   Patient is from:  Home  Anticipated DC to:  Home  Anticipated DC date:  1 to 2 days  Anticipated DC barriers: None  Consults called:  Nephrology consulted by EDP, Dr. Jonnie Finner  Admission status:  Observation, telemetry   Severity of Illness: The appropriate patient status for this patient is OBSERVATION. Observation status is judged to be reasonable and necessary in order to provide the required intensity of service to ensure the patient's safety. The patient's presenting symptoms, physical exam findings, and initial radiographic and laboratory data in the context of their medical condition is felt to place them at decreased risk for further clinical deterioration. Furthermore, it is anticipated that the patient will be  medically stable for discharge from the hospital within 2 midnights of admission. The following factors support the patient status of observation.   " The patient's presenting symptoms include shortness of breath, weight gain. " The physical exam findings include rales. " The  initial radiographic and laboratory data are chest x-ray with cardiomegaly and pulmonary venous congestion, creatinine 5.64 consistent with ESRD, hemoglobin 6.8 down from baseline of 7.9.  Troponin flat at 17 and 19.   Marcelyn Bruins MD [facility]  How to contact the [facility] Attending or Consulting provider [place] or covering provider during after hours [place], for this patient?   1. Check the care team in [facility] and look for a) attending/consulting [facility] provider listed and b) the [facility] team listed 2. Log into [email or web address] and use [place]'s universal password to access. If you do not have the password, please contact the hospital operator. 3. Locate the [facility] provider you are looking for under [facility] and page to a number that you can be directly reached. 4. If you still have difficulty reaching the provider, please page the [facility] (Director on Call) for the Hospitalists listed on amion for assistance.  12/31/2020, 11:45 PM

## 2020-12-31 NOTE — ED Triage Notes (Signed)
Pt arrives pov with c/o of shob x 1 week, with worsening today. Recent dialysis x 2 days pta. Normally 2L o2, currently on 4.5L. Cardiac cath x 3 weeks pta, no stents placed

## 2020-12-31 NOTE — Progress Notes (Signed)
New Admission Note:   Arrival Method: Arrived via Care link from [facility] ED Mental Orientation: Alert and oriented x4 Telemetry: Box #5 Assessment: Completed Skin: See doc flowsheet IV: NSL- Rt FA Pain: 0/10 Tubes: N/A Safety Measures: Safety Fall Prevention Plan has been discussed.  Admission: Completed 5MW Orientation: Patient has been oriented to the room, unit and staff.  Family: None at this time  Orders have been reviewed and implemented. Will continue to monitor the patient. Call light has been placed within reach and bed alarm has been activated.   Latasha Buczkowski [employer], RN-BC Phone number: [phone]

## 2020-12-31 NOTE — ED Notes (Signed)
Handoff report given to [place] RN

## 2021-01-01 DIAGNOSIS — I5042 Chronic combined systolic (congestive) and diastolic (congestive) heart failure: Secondary | ICD-10-CM | POA: Diagnosis not present

## 2021-01-01 DIAGNOSIS — Z20822 Contact with and (suspected) exposure to covid-19: Secondary | ICD-10-CM | POA: Diagnosis present

## 2021-01-01 DIAGNOSIS — E8770 Fluid overload, unspecified: Secondary | ICD-10-CM | POA: Diagnosis not present

## 2021-01-01 DIAGNOSIS — E8779 Other fluid overload: Secondary | ICD-10-CM | POA: Diagnosis not present

## 2021-01-01 DIAGNOSIS — N186 End stage renal disease: Secondary | ICD-10-CM | POA: Diagnosis not present

## 2021-01-01 DIAGNOSIS — D631 Anemia in chronic kidney disease: Secondary | ICD-10-CM | POA: Diagnosis not present

## 2021-01-01 DIAGNOSIS — R0600 Dyspnea, unspecified: Secondary | ICD-10-CM | POA: Diagnosis not present

## 2021-01-01 DIAGNOSIS — I120 Hypertensive chronic kidney disease with stage 5 chronic kidney disease or end stage renal disease: Secondary | ICD-10-CM | POA: Diagnosis not present

## 2021-01-01 DIAGNOSIS — Z885 Allergy status to narcotic agent status: Secondary | ICD-10-CM | POA: Diagnosis not present

## 2021-01-01 DIAGNOSIS — J9621 Acute and chronic respiratory failure with hypoxia: Secondary | ICD-10-CM | POA: Diagnosis not present

## 2021-01-01 DIAGNOSIS — F17210 Nicotine dependence, cigarettes, uncomplicated: Secondary | ICD-10-CM | POA: Diagnosis present

## 2021-01-01 DIAGNOSIS — Z83438 Family history of other disorder of lipoprotein metabolism and other lipidemia: Secondary | ICD-10-CM | POA: Diagnosis not present

## 2021-01-01 DIAGNOSIS — R0902 Hypoxemia: Secondary | ICD-10-CM | POA: Diagnosis not present

## 2021-01-01 DIAGNOSIS — R0602 Shortness of breath: Secondary | ICD-10-CM | POA: Diagnosis not present

## 2021-01-01 DIAGNOSIS — N250 Renal osteodystrophy: Secondary | ICD-10-CM | POA: Diagnosis not present

## 2021-01-01 DIAGNOSIS — E785 Hyperlipidemia, unspecified: Secondary | ICD-10-CM | POA: Diagnosis present

## 2021-01-01 DIAGNOSIS — Z79899 Other long term (current) drug therapy: Secondary | ICD-10-CM | POA: Diagnosis not present

## 2021-01-01 DIAGNOSIS — Z91048 Other nonmedicinal substance allergy status: Secondary | ICD-10-CM | POA: Diagnosis not present

## 2021-01-01 DIAGNOSIS — E1122 Type 2 diabetes mellitus with diabetic chronic kidney disease: Secondary | ICD-10-CM | POA: Diagnosis present

## 2021-01-01 DIAGNOSIS — J9601 Acute respiratory failure with hypoxia: Secondary | ICD-10-CM | POA: Diagnosis not present

## 2021-01-01 DIAGNOSIS — J810 Acute pulmonary edema: Secondary | ICD-10-CM | POA: Diagnosis not present

## 2021-01-01 DIAGNOSIS — I10 Essential (primary) hypertension: Secondary | ICD-10-CM | POA: Diagnosis not present

## 2021-01-01 DIAGNOSIS — Z823 Family history of stroke: Secondary | ICD-10-CM | POA: Diagnosis not present

## 2021-01-01 DIAGNOSIS — Z8249 Family history of ischemic heart disease and other diseases of the circulatory system: Secondary | ICD-10-CM | POA: Diagnosis not present

## 2021-01-01 DIAGNOSIS — I132 Hypertensive heart and chronic kidney disease with heart failure and with stage 5 chronic kidney disease, or end stage renal disease: Secondary | ICD-10-CM | POA: Diagnosis present

## 2021-01-01 DIAGNOSIS — J90 Pleural effusion, not elsewhere classified: Secondary | ICD-10-CM | POA: Diagnosis not present

## 2021-01-01 DIAGNOSIS — E1140 Type 2 diabetes mellitus with diabetic neuropathy, unspecified: Secondary | ICD-10-CM | POA: Diagnosis present

## 2021-01-01 DIAGNOSIS — Z7982 Long term (current) use of aspirin: Secondary | ICD-10-CM | POA: Diagnosis not present

## 2021-01-01 DIAGNOSIS — K219 Gastro-esophageal reflux disease without esophagitis: Secondary | ICD-10-CM | POA: Diagnosis present

## 2021-01-01 DIAGNOSIS — I2510 Atherosclerotic heart disease of native coronary artery without angina pectoris: Secondary | ICD-10-CM | POA: Diagnosis present

## 2021-01-01 DIAGNOSIS — Z8673 Personal history of transient ischemic attack (TIA), and cerebral infarction without residual deficits: Secondary | ICD-10-CM | POA: Diagnosis not present

## 2021-01-01 DIAGNOSIS — J439 Emphysema, unspecified: Secondary | ICD-10-CM | POA: Diagnosis present

## 2021-01-01 DIAGNOSIS — Z992 Dependence on renal dialysis: Secondary | ICD-10-CM | POA: Diagnosis not present

## 2021-01-01 DIAGNOSIS — I517 Cardiomegaly: Secondary | ICD-10-CM | POA: Diagnosis not present

## 2021-01-01 DIAGNOSIS — D649 Anemia, unspecified: Secondary | ICD-10-CM | POA: Diagnosis not present

## 2021-01-01 LAB — GLUCOSE, CAPILLARY
Glucose-Capillary: 77 mg/dL (ref 70–99)
Glucose-Capillary: 173 mg/dL — ABNORMAL HIGH (ref 70–99)
Glucose-Capillary: 127 mg/dL — ABNORMAL HIGH (ref 70–99)
Glucose-Capillary: 256 mg/dL — ABNORMAL HIGH (ref 70–99)

## 2021-01-01 LAB — COMPREHENSIVE METABOLIC PANEL
ALT: 9 U/L (ref 0–44)
AST: 11 U/L — ABNORMAL LOW (ref 15–41)
Albumin: 2.6 g/dL — ABNORMAL LOW (ref 3.5–5.0)
Alkaline Phosphatase: 61 U/L (ref 38–126)
Anion gap: 9 (ref 5–15)
BUN: 18 mg/dL (ref 8–23)
CO2: 27 mmol/L (ref 22–32)
Calcium: 8.2 mg/dL — ABNORMAL LOW (ref 8.9–10.3)
Chloride: 101 mmol/L (ref 98–111)
Creatinine, Ser: 2.84 mg/dL — ABNORMAL HIGH (ref 0.44–1.00)
GFR, Estimated: 18 mL/min — ABNORMAL LOW (ref >60)
Glucose, Bld: 100 mg/dL — ABNORMAL HIGH (ref 70–99)
Potassium: 3.3 mmol/L — ABNORMAL LOW (ref 3.5–5.1)
Sodium: 137 mmol/L (ref 135–145)
Total Bilirubin: 0.5 mg/dL (ref 0.3–1.2)
Total Protein: 5.7 g/dL — ABNORMAL LOW (ref 6.5–8.1)

## 2021-01-01 LAB — CBC
HCT: 20.1 % — ABNORMAL LOW (ref 36.0–46.0)
Hemoglobin: 6.1 g/dL — CL (ref 12.0–15.0)
MCH: 28.6 pg (ref 26.0–34.0)
MCHC: 30.3 g/dL (ref 30.0–36.0)
MCV: 94.4 fL (ref 80.0–100.0)
Platelets: 252 10*3/uL (ref 150–400)
RBC: 2.13 MIL/uL — ABNORMAL LOW (ref 3.87–5.11)
RDW: 16.3 % — ABNORMAL HIGH (ref 11.5–15.5)
WBC: 7.5 10*3/uL (ref 4.0–10.5)

## 2021-01-01 LAB — HEMOGLOBIN AND HEMATOCRIT, BLOOD
HCT: 20.2 % — ABNORMAL LOW (ref 36.0–46.0)
HCT: 24.4 % — ABNORMAL LOW (ref 36.0–46.0)
Hemoglobin: 6.3 g/dL — CL (ref 12.0–15.0)
Hemoglobin: 7.5 g/dL — ABNORMAL LOW (ref 12.0–15.0)

## 2021-01-01 LAB — PREPARE RBC (CROSSMATCH)

## 2021-01-01 MED ORDER — DOXERCALCIFEROL 4 MCG/2ML IV SOLN
2.0000 ug | INTRAVENOUS | Status: DC
  Filled 2021-01-01 (×2): qty 2

## 2021-01-01 MED ORDER — SODIUM CHLORIDE 0.9% IV SOLUTION: Freq: Once | INTRAVENOUS | Status: AC

## 2021-01-01 MED ORDER — CALCIUM ACETATE (PHOS BINDER) 667 MG PO CAPS
1334.0000 mg | ORAL_CAPSULE | Freq: Three times a day (TID) | ORAL | Status: DC
  Administered 2021-01-02 – 2021-01-04 (×6): 1334 mg via ORAL
  Filled 2021-01-01 (×6): qty 2

## 2021-01-01 MED ORDER — DARBEPOETIN ALFA 200 MCG/0.4ML IJ SOSY: 200.0000 ug | PREFILLED_SYRINGE | INTRAMUSCULAR | Status: DC

## 2021-01-01 MED ORDER — SODIUM CHLORIDE 0.9 % IV SOLN: 125.0000 mg | INTRAVENOUS | Status: DC

## 2021-01-01 MED ORDER — CHLORHEXIDINE GLUCONATE CLOTH 2 % EX PADS
6.0000 | MEDICATED_PAD | Freq: Every day | CUTANEOUS | Status: DC
  Administered 2021-01-02 – 2021-01-04 (×3): 6 via TOPICAL

## 2021-01-01 MED ORDER — SODIUM CHLORIDE 0.9 % IV SOLN
125.0000 mg | INTRAVENOUS | Status: DC
  Administered 2021-01-01 – 2021-01-03 (×2): 125 mg via INTRAVENOUS
  Filled 2021-01-01 (×2): qty 10

## 2021-01-01 MED ORDER — DARBEPOETIN ALFA 200 MCG/0.4ML IJ SOSY
200.0000 ug | PREFILLED_SYRINGE | INTRAMUSCULAR | Status: AC
  Filled 2021-01-01: qty 0.4

## 2021-01-01 MED ORDER — HEPARIN SODIUM (PORCINE) 1000 UNIT/ML IJ SOLN
INTRAMUSCULAR | Status: AC
  Filled 2021-01-01: qty 6

## 2021-01-01 NOTE — Progress Notes (Signed)
Patient ID: Tina Gomez, female   DOB: [birth year], 65 y.o.   MRN: [id number]  PROGRESS NOTE    KENNEDII ARTIST  [id number] DOB: May 18, 1956 DOA: 12/31/2020 PCP: Charlott Rakes, MD   Brief Narrative:  65 y.o. female with medical history significant of ESRD on HD Monday Wednesday Friday, chronic combined CHF, COPD, unspecified CVA, anemia of chronic disease, nonobstructive CAD as seen on cardiac cath 3 weeks ago, hypertension, GERD, hyperlipidemia, diabetes, chronic hypoxic respiratory failure on 2 L oxygen via nasal cannula presented on 12/31/2020 with worsening shortness of breath and weight gain.  On presentation, she required 4 L oxygen to maintain saturations; hemoglobin was 6.8 down from baseline of around 8; troponin were flat at 17 and 19.  COVID-19 and flu tests were negative.  Chest x-ray showed cardiomegaly with pulmonary venous congestion.  Nephrology was consulted and patient underwent dialysis overnight on 12/31/2020.  Assessment & Plan:   Volume overload End-stage renal disease on hemodialysis Acute on chronic hypoxic respiratory failure -Patient presented with worsening shortness of breath and chest x-ray showed pulmonary venous congestion.  She required 4 L oxygen via nasal cannula and is normally on 2 L at home.  She underwent hemodialysis last night.  Nephrology following.  Still requiring 4 L oxygen via nasal cannula.  Wean off as able -Strict input and output.  Daily weights.  Fluid restriction  Acute on chronic anemia of chronic disease -Baseline hemoglobin around close to 8.  Hemoglobin 6.1 this morning.  She will be transfused 1 unit packed red cells.  Monitor hemoglobin.  Chronic systolic and diastolic CHF Nonobstructive CAD Hypertension Hyperlipidemia -Last echo in March showed EF of 30% with grade 2 diastolic dysfunction.  Had a cath recently 2 weeks ago which showed nonobstructive CAD.  Volume is being managed by dialysis.  Continue aspirin, statin, Coreg,  losartan -Outpatient follow-up with cardiology  Diabetes mellitus type II -Continue CBGs with SSI  COPD Continue inhalers as needed  History of unspecified CVA -Continue home aspirin   DVT prophylaxis: Heparin Code Status:  Full Family Communication: Daughter at bedside Disposition Plan: Status is: Observation  The patient will require care spanning > 2 midnights and should be moved to inpatient because: Inpatient level of care appropriate due to severity of illness  Dispo: The patient is from: Home              Anticipated d/c is to: Home              Patient currently is not medically stable to d/c.   Difficult to place patient No   Consultants: Nephrology  Procedures: None  Antimicrobials: None   Subjective: Patient seen and examined at bedside.  Feels slightly better.  Still short of breath with exertion.  Denies any overnight fever or vomiting.  No current chest pain.  Objective: Vitals:   01/01/21 0158 01/01/21 0228 01/01/21 0315 01/01/21 0936  BP: (!) 146/51 (!) 159/54 (!) 157/50 (!) 136/97  Pulse: 65 65 65 67  Resp: '20 19 20 18  '$ Temp:  98.6 F (37 C) 97.7 F (36.5 C) 97.8 F (36.6 C)  TempSrc:  Oral Oral Oral  SpO2: 91% 95% 91% 96%  Weight:      Height:        Intake/Output Summary (Last 24 hours) at 01/01/2021 1058 Last data filed at 01/01/2021 0800 Gross per 24 hour  Intake 320 ml  Output 2550 ml  Net -2230 ml   Filed Weights   12/31/20  1956  Weight: 80.7 kg    Examination:  General exam: Appears calm and comfortable.  Looks chronically ill.  Currently on 4 L oxygen by nasal cannula Respiratory system: Bilateral decreased breath sounds at bases with scattered crackles Cardiovascular system: S1 & S2 heard, Rate controlled Gastrointestinal system: Abdomen is nondistended, soft and nontender. Normal bowel sounds heard. Extremities: No cyanosis, clubbing; trace lower extremity edema Central nervous system: Alert and oriented. No focal  neurological deficits. Moving extremities Skin: No rashes, lesions or ulcers Psychiatry: Flat affect    Data Reviewed: I have personally reviewed following labs and imaging studies  CBC: Recent Labs  Lab 12/31/20 1425 01/01/21 0405  WBC 7.6 7.5  NEUTROABS 5.3  --   HGB 6.8* 6.1*  6.3*  HCT 21.4* 20.1*  20.2*  MCV 94.3 94.4  PLT 257 [id number]   Basic Metabolic Panel: Recent Labs  Lab 12/31/20 1425 01/01/21 0405  NA 138 137  K 3.7 3.3*  CL 98 101  CO2 24 27  GLUCOSE 143* 100*  BUN 52* 18  CREATININE 5.64* 2.84*  CALCIUM 7.9* 8.2*   GFR: Estimated Creatinine Clearance: 19.9 mL/min (A) (by C-G formula based on SCr of 2.84 mg/dL (H)). Liver Function Tests: Recent Labs  Lab 01/01/21 0405  AST 11*  ALT 9  ALKPHOS 61  BILITOT 0.5  PROT 5.7*  ALBUMIN 2.6*   No results for input(s): LIPASE, AMYLASE in the last 168 hours. No results for input(s): AMMONIA in the last 168 hours. Coagulation Profile: No results for input(s): INR, PROTIME in the last 168 hours. Cardiac Enzymes: No results for input(s): CKTOTAL, CKMB, CKMBINDEX, TROPONINI in the last 168 hours. BNP (last 3 results) No results for input(s): PROBNP in the last 8760 hours. HbA1C: No results for input(s): HGBA1C in the last 72 hours. CBG: Recent Labs  Lab 12/31/20 2140 01/01/21 0647  GLUCAP 109* 77   Lipid Profile: No results for input(s): CHOL, HDL, LDLCALC, TRIG, CHOLHDL, LDLDIRECT in the last 72 hours. Thyroid Function Tests: No results for input(s): TSH, T4TOTAL, FREET4, T3FREE, THYROIDAB in the last 72 hours. Anemia Panel: No results for input(s): VITAMINB12, FOLATE, FERRITIN, TIBC, IRON, RETICCTPCT in the last 72 hours. Sepsis Labs: No results for input(s): PROCALCITON, LATICACIDVEN in the last 168 hours.  Recent Results (from the past 240 hour(s))  Resp Panel by RT-PCR (Flu A&B, Covid) Nasopharyngeal Swab     Status: None   Collection Time: 12/31/20  1:30 PM   Specimen: Nasopharyngeal Swab;  Nasopharyngeal(NP) swabs in vial transport medium  Result Value Ref Range Status   SARS Coronavirus 2 by RT PCR NEGATIVE NEGATIVE Final    Comment: (NOTE) SARS-CoV-2 target nucleic acids are NOT DETECTED.  The SARS-CoV-2 RNA is generally detectable in upper respiratory specimens during the acute phase of infection. The lowest concentration of SARS-CoV-2 viral copies this assay can detect is 138 copies/mL. A negative result does not preclude SARS-Cov-2 infection and should not be used as the sole basis for treatment or other patient management decisions. A negative result may occur with  improper specimen collection/handling, submission of specimen other than nasopharyngeal swab, presence of viral mutation(s) within the areas targeted by this assay, and inadequate number of viral copies(<138 copies/mL). A negative result must be combined with clinical observations, patient history, and epidemiological information. The expected result is Negative.  Fact Sheet for Patients:  EntrepreneurPulse.com.au  Fact Sheet for Healthcare Providers:  IncredibleEmployment.be  This test is no t yet approved or cleared by the [place]  States FDA and  has been authorized for detection and/or diagnosis of SARS-CoV-2 by FDA under an Emergency Use Authorization (EUA). This EUA will remain  in effect (meaning this test can be used) for the duration of the COVID-19 declaration under Section 564(b)(1) of the Act, 21 U.S.C.section 360bbb-3(b)(1), unless the authorization is terminated  or revoked sooner.       Influenza A by PCR NEGATIVE NEGATIVE Final   Influenza B by PCR NEGATIVE NEGATIVE Final    Comment: (NOTE) The Xpert Xpress SARS-CoV-2/FLU/RSV plus assay is intended as an aid in the diagnosis of influenza from Nasopharyngeal swab specimens and should not be used as a sole basis for treatment. Nasal washings and aspirates are unacceptable for Xpert Xpress  SARS-CoV-2/FLU/RSV testing.  Fact Sheet for Patients: EntrepreneurPulse.com.au  Fact Sheet for Healthcare Providers: IncredibleEmployment.be  This test is not yet approved or cleared by the [place] FDA and has been authorized for detection and/or diagnosis of SARS-CoV-2 by FDA under an Emergency Use Authorization (EUA). This EUA will remain in effect (meaning this test can be used) for the duration of the COVID-19 declaration under Section 564(b)(1) of the Act, 21 U.S.C. section 360bbb-3(b)(1), unless the authorization is terminated or revoked.  Performed at [facility], [address], [place], [place] [address]          Radiology Studies: DG Chest Portable 1 View  Result Date: 12/31/2020 CLINICAL DATA:  Shortness of breath.  End-stage renal disease. EXAM: PORTABLE CHEST 1 VIEW COMPARISON:  December 15, 2020 FINDINGS: The right-sided double lumen catheter is in good position. No pneumothorax. Mild increased interstitial markings without overt edema. Small bilateral pleural effusions were not seen previously. No other abnormalities. IMPRESSION: 1. Cardiomegaly, mild pulmonary venous congestion/edema, and small pleural effusions. Electronically Signed   By: Dorise Bullion III M.D   On: 12/31/2020 14:45        Scheduled Meds: . sodium chloride   Intravenous Once  . aspirin EC  325 mg Oral Daily  . atorvastatin  40 mg Oral QPM  . carvedilol  12.5 mg Oral BID WC  . Chlorhexidine Gluconate Cloth  6 each Topical Q0600  . [START ON 01/02/2021] darbepoetin (ARANESP) injection - DIALYSIS  200 mcg Intravenous Q Tue-HD  . [START ON 01/10/2021] darbepoetin (ARANESP) injection - DIALYSIS  200 mcg Intravenous Q Wed-HD  . doxercalciferol  2 mcg Intravenous Q M,W,F-HD  . FLUoxetine  20 mg Oral Daily  . gabapentin  100 mg Oral QHS  . heparin  5,000 Units Subcutaneous Q8H  . heparin sodium (porcine)      . insulin aspart  0-6 Units  Subcutaneous TID WC  . losartan  50 mg Oral Daily  . multivitamin  1 tablet Oral QHS  . sodium chloride flush  3 mL Intravenous Q12H   Continuous Infusions: . albumin human    . ferric gluconate (FERRLECIT/NULECIT) IV            Aline August, MD [facility] 01/01/2021, 10:58 AM

## 2021-01-01 NOTE — Progress Notes (Signed)
HOSPITAL MEDICINE OVERNIGHT EVENT NOTE    Notified by nursing posttransfusion hemoglobin is 6.0.  This is a drop in hemoglobin compared to admission hemoglobin despite patient undergoing hemodialysis earlier this morning.    According to nursing there is no gross evidence of bleeding, no shortness of breath or chest pain at this time.  Will order 1 unit packed blood cell transfusion with posttransfusion H&H ordered.  Vernelle Emerald  MD [facility]

## 2021-01-01 NOTE — Progress Notes (Signed)
[facility] Progress Note   Subjective:   Tolerated HD yesterday with net UF 2.5L, but machine clotted at the end of HD. Hgb dropped to 6.0 overnight. Pt reports this has been an ongoing issue, not sure of cause at this point. FOBT negative on 4/24 and she denies any known bleeding including melena.   Objective Vitals:   01/01/21 0128 01/01/21 0158 01/01/21 0228 01/01/21 0315  BP: (!) 151/63 (!) 146/51 (!) 159/54 (!) 157/50  Pulse: 63 65 65 65  Resp: '17 20 19 20  '$ Temp:   98.6 F (37 C) 97.7 F (36.5 C)  TempSrc:   Oral Oral  SpO2: 90% 91% 95% 91%  Weight:      Height:       Physical Exam General: well developed female, alert and in NAD Heart: RRR, no murmurs, rubs or gallops Lungs: CTA bilaterally without wheezing, on O2 4L via [place] Abdomen: Soft, non-distended, +BS Extremities: No edema b/l lower extremities Dialysis Access: R IJ TDC with clean, dry bandage  Additional Objective Labs: Basic Metabolic Panel: Recent Labs  Lab 12/31/20 1425 01/01/21 0405  NA 138 137  K 3.7 3.3*  CL 98 101  CO2 24 27  GLUCOSE 143* 100*  BUN 52* 18  CREATININE 5.64* 2.84*  CALCIUM 7.9* 8.2*   Liver Function Tests: Recent Labs  Lab 01/01/21 0405  AST 11*  ALT 9  ALKPHOS 61  BILITOT 0.5  PROT 5.7*  ALBUMIN 2.6*   CBC: Recent Labs  Lab 12/31/20 1425 01/01/21 0405  WBC 7.6 7.5  NEUTROABS 5.3  --   HGB 6.8* 6.1*  6.3*  HCT 21.4* 20.1*  20.2*  MCV 94.3 94.4  PLT 257 252   CBG: Recent Labs  Lab 12/31/20 2140 01/01/21 0647  GLUCAP 109* 77   Studies/Results: DG Chest Portable 1 View  Result Date: 12/31/2020 CLINICAL DATA:  Shortness of breath.  End-stage renal disease. EXAM: PORTABLE CHEST 1 VIEW COMPARISON:  December 15, 2020 FINDINGS: The right-sided double lumen catheter is in good position. No pneumothorax. Mild increased interstitial markings without overt edema. Small bilateral pleural effusions were not seen previously. No other abnormalities.  IMPRESSION: 1. Cardiomegaly, mild pulmonary venous congestion/edema, and small pleural effusions. Electronically Signed   By: Dorise Bullion III M.D   On: 12/31/2020 14:45   Medications: . albumin human     . sodium chloride   Intravenous Once  . aspirin EC  325 mg Oral Daily  . atorvastatin  40 mg Oral QPM  . carvedilol  12.5 mg Oral BID WC  . Chlorhexidine Gluconate Cloth  6 each Topical Q0600  . FLUoxetine  20 mg Oral Daily  . gabapentin  100 mg Oral QHS  . heparin  5,000 Units Subcutaneous Q8H  . heparin sodium (porcine)      . insulin aspart  0-6 Units Subcutaneous TID WC  . losartan  50 mg Oral Daily  . multivitamin  1 tablet Oral QHS  . sodium chloride flush  3 mL Intravenous Q12H    Outpatient Dialysis Orders: MWF AF  3h 72mn  3K/2.5 bath  AVF LUA / RIJ TDC  Heparin- none  Mircera 2283m IV q 2 weeks- last dose 12/18/20 Venofer '100mg'$  IV q HD x 10 doses (last tsat 14, ferritin 275 on 4/20)- not started yet hectorol 2 mcg IV q HD  Assessment/Plan: 1. Dyspnea - acute on chronic resp failure, on home O2 w/ hx COPD, HFrEF and chronic anemia.  Suspect vol  excess/ pulm edema contributing. Tolerated 2.5L UF with HD overnight and shortness of breath is significantly improved. Receiving PRBC today, plan for HD again tomorrow then resume regular schedule on Wednesday. 2. ESRD - on HD MWF schedule, had HD overnight 4/24-4/25. Will plan for extra HD tomorrow then resume MWF schedule.  Has had 4-5 syncopal episodes on HD since starting 6 mos ago. And per last admit there was noted a hx of  sig anxiety and RLS and signs off early most treatments. Patient does wish to continue HD and tolerated it well last night.  3. Hypertension/volume:BPelevated now, usually drops w/ HD. Consider midodrine pre/ during HD if BP continues to drop.  4. Anemia ckd - No known source of bleeding and FOBT negative. She is iron deficient per outpatient labs on 4/20. Will start IV Fe here. Also due for ESA today,  will give dose with HD tomorrow.  5. Metabolic bone disease: Continue hectorol and phosphorus binders 6. Nutrition:Alb low,continue proteinsupplements. 7. HFrEF (EF 30%): Recent LHC with non-obstructive CAD  8. COPD:per primary.Home O2 on discharge. 9. Goals of care: Pt reports a doctor mentioned to her that her heart may not tolerate HD long term given her recurrent hypotension/syncope, which was distressing to her/her daughter. Discussed that some patients with cardiac disease cannot tolerate dialysis eventually and develop refractory volume overload, but she did tolerate HD yesterday and not all options for hypotension have been exhausted. Plan for now is to continue HD and readdress goals of care if she eventually stops tolerating dialysis.   Anice Paganini, PA-C 01/01/2021, 9:32 AM  [facility] Pager: [phone]

## 2021-01-02 DIAGNOSIS — J9621 Acute and chronic respiratory failure with hypoxia: Secondary | ICD-10-CM

## 2021-01-02 DIAGNOSIS — E8770 Fluid overload, unspecified: Secondary | ICD-10-CM | POA: Diagnosis not present

## 2021-01-02 DIAGNOSIS — I5042 Chronic combined systolic (congestive) and diastolic (congestive) heart failure: Secondary | ICD-10-CM | POA: Diagnosis not present

## 2021-01-02 DIAGNOSIS — I10 Essential (primary) hypertension: Secondary | ICD-10-CM | POA: Diagnosis not present

## 2021-01-02 LAB — GLUCOSE, CAPILLARY
Glucose-Capillary: 118 mg/dL — ABNORMAL HIGH (ref 70–99)
Glucose-Capillary: 90 mg/dL (ref 70–99)
Glucose-Capillary: 218 mg/dL — ABNORMAL HIGH (ref 70–99)

## 2021-01-02 LAB — CBC WITH DIFFERENTIAL/PLATELET
Abs Immature Granulocytes: 0.04 10*3/uL (ref 0.00–0.07)
Basophils Absolute: 0 10*3/uL (ref 0.0–0.1)
Basophils Relative: 0 %
Eosinophils Absolute: 0.2 10*3/uL (ref 0.0–0.5)
Eosinophils Relative: 3 %
HCT: 23.8 % — ABNORMAL LOW (ref 36.0–46.0)
Hemoglobin: 7.3 g/dL — ABNORMAL LOW (ref 12.0–15.0)
Immature Granulocytes: 1 %
Lymphocytes Relative: 18 %
Lymphs Abs: 1.4 10*3/uL (ref 0.7–4.0)
MCH: 28.9 pg (ref 26.0–34.0)
MCHC: 30.7 g/dL (ref 30.0–36.0)
MCV: 94.1 fL (ref 80.0–100.0)
Monocytes Absolute: 0.7 10*3/uL (ref 0.1–1.0)
Monocytes Relative: 8 %
Neutro Abs: 5.5 10*3/uL (ref 1.7–7.7)
Neutrophils Relative %: 70 %
Platelets: 240 10*3/uL (ref 150–400)
RBC: 2.53 MIL/uL — ABNORMAL LOW (ref 3.87–5.11)
RDW: 16.5 % — ABNORMAL HIGH (ref 11.5–15.5)
WBC: 7.9 10*3/uL (ref 4.0–10.5)
nRBC: 0 % (ref 0.0–0.2)

## 2021-01-02 LAB — TYPE AND SCREEN
ABO/RH(D): O POS
Antibody Screen: NEGATIVE
Unit division: 0

## 2021-01-02 LAB — BASIC METABOLIC PANEL
Anion gap: 12 (ref 5–15)
BUN: 39 mg/dL — ABNORMAL HIGH (ref 8–23)
CO2: 26 mmol/L (ref 22–32)
Calcium: 8 mg/dL — ABNORMAL LOW (ref 8.9–10.3)
Chloride: 103 mmol/L (ref 98–111)
Creatinine, Ser: 5 mg/dL — ABNORMAL HIGH (ref 0.44–1.00)
GFR, Estimated: 9 mL/min — ABNORMAL LOW (ref >60)
Glucose, Bld: 122 mg/dL — ABNORMAL HIGH (ref 70–99)
Potassium: 3.7 mmol/L (ref 3.5–5.1)
Sodium: 141 mmol/L (ref 135–145)

## 2021-01-02 LAB — BPAM RBC
Blood Product Expiration Date: 202205172359
ISSUE DATE / TIME: 202204251430
Unit Type and Rh: 5100

## 2021-01-02 MED ORDER — DARBEPOETIN ALFA 200 MCG/0.4ML IJ SOSY
PREFILLED_SYRINGE | INTRAMUSCULAR | Status: AC
  Administered 2021-01-02: 200 ug via INTRAVENOUS
  Filled 2021-01-02: qty 0.4

## 2021-01-02 NOTE — Progress Notes (Addendum)
[facility] Progress Note   Subjective:   Seen on HD.  TDC not working at all - using AVF 2 needles today which is going fine.  She has dyspnea again this AM - not as bad as Sunday was though.    Objective Vitals:   01/02/21 0800 01/02/21 0820 01/02/21 0830 01/02/21 0900  BP: (!) 162/53 (!) 175/58 (!) 181/65 (!) 175/58  Pulse: (!) 59     Resp: (!) '22 19 14 15  '$ Temp: (!) 97.5 F (36.4 C)     TempSrc:      SpO2: 92%     Weight:      Height:       Physical Exam General: well developed female, alert and in NAD Heart: RRR, no murmurs, rubs or gallops Lungs: scattered rhonchi BL,  on O2 4L via [place] Abdomen: Soft, non-distended, +BS Extremities: No edema b/l lower extremities Dialysis Access: R IJ TDC with clean, dry bandage; LUE AVF 16g Qb 300  Additional Objective Labs: Basic Metabolic Panel: Recent Labs  Lab 12/31/20 1425 01/01/21 0405 01/02/21 0543  NA 138 137 141  K 3.7 3.3* 3.7  CL 98 101 103  CO2 '24 27 26  '$ GLUCOSE 143* 100* 122*  BUN 52* 18 39*  CREATININE 5.64* 2.84* 5.00*  CALCIUM 7.9* 8.2* 8.0*   Liver Function Tests: Recent Labs  Lab 01/01/21 0405  AST 11*  ALT 9  ALKPHOS 61  BILITOT 0.5  PROT 5.7*  ALBUMIN 2.6*   CBC: Recent Labs  Lab 12/31/20 1425 01/01/21 0405 01/01/21 1920 01/02/21 0543  WBC 7.6 7.5  --  7.9  NEUTROABS 5.3  --   --  5.5  HGB 6.8* 6.1*  6.3* 7.5* 7.3*  HCT 21.4* 20.1*  20.2* 24.4* 23.8*  MCV 94.3 94.4  --  94.1  PLT 257 252  --  240   CBG: Recent Labs  Lab 01/01/21 0647 01/01/21 1139 01/01/21 1635 01/01/21 2039 01/02/21 0649  GLUCAP 77 173* 127* 256* 118*   Studies/Results: DG Chest Portable 1 View  Result Date: 12/31/2020 CLINICAL DATA:  Shortness of breath.  End-stage renal disease. EXAM: PORTABLE CHEST 1 VIEW COMPARISON:  December 15, 2020 FINDINGS: The right-sided double lumen catheter is in good position. No pneumothorax. Mild increased interstitial markings without overt edema. Small bilateral  pleural effusions were not seen previously. No other abnormalities. IMPRESSION: 1. Cardiomegaly, mild pulmonary venous congestion/edema, and small pleural effusions. Electronically Signed   By: Dorise Bullion III M.D   On: 12/31/2020 14:45   Medications: . albumin human    . ferric gluconate (FERRLECIT/NULECIT) IV Stopped (01/01/21 1329)   . atorvastatin  40 mg Oral QPM  . calcium acetate  1,334 mg Oral TID WC  . carvedilol  12.5 mg Oral BID WC  . Chlorhexidine Gluconate Cloth  6 each Topical Q0600  . darbepoetin (ARANESP) injection - DIALYSIS  200 mcg Intravenous Q Tue-HD  . [START ON 01/10/2021] darbepoetin (ARANESP) injection - DIALYSIS  200 mcg Intravenous Q Wed-HD  . doxercalciferol  2 mcg Intravenous Q M,W,F-HD  . FLUoxetine  20 mg Oral Daily  . gabapentin  100 mg Oral QHS  . heparin  5,000 Units Subcutaneous Q8H  . insulin aspart  0-6 Units Subcutaneous TID WC  . losartan  50 mg Oral Daily  . multivitamin  1 tablet Oral QHS  . sodium chloride flush  3 mL Intravenous Q12H    Outpatient Dialysis Orders: MWF AF  3h 17mn  3K/2.5  bath  AVF LUA / RIJ TDC  Heparin- none  Mircera 229mg IV q 2 weeks- last dose 12/18/20 Venofer '100mg'$  IV q HD x 10 doses (last tsat 14, ferritin 275 on 4/20)- not started yet hectorol 2 mcg IV q HD  Assessment/Plan: 1. Dyspnea - acute on chronic resp failure, on home O2 w/ hx COPD, HFrEF and chronic anemia.  Suspect vol excess/ pulm edema contributing. Challenging EDW as tolerated.  2. ESRD - on HD MWF schedule, had HD overnight 4/24-4/25. Will plan for extra HD today then resume MWF schedule.  Has had 4-5 syncopal episodes on HD since starting 6 mos ago. And per last admit there was noted a hx of  sig anxiety and RLS and signs off early most treatments. Patient does wish to continue HD and tolerated it well Sunday.  [facility] today [facility] but in light of recent infiltration will maintain for another  treatment. 3. Hypertension/volume:BPelevated now, usually drops w/ HD. Consider midodrine pre/ during HD if BP continues to drop.  4. Anemia ckd - No known source of bleeding and FOBT negative. She is iron deficient per outpatient labs on 4/20. Start IV Fe here. Dose ESA today per outpt schedule. 5. Metabolic bone disease: Continue hectorol and phosphorus binders 6. Nutrition:Alb low,continue proteinsupplements. 7. HFrEF (EF 30%): Recent LHC with non-obstructive CAD  8. COPD:per primary.Home O2 on discharge. 9. Goals of care: Pt reports a doctor mentioned to her that her heart may not tolerate HD long term given her recurrent hypotension/syncope, which was distressing to her/her daughter. Discussed that some patients with cardiac disease cannot tolerate dialysis eventually and develop refractory volume overload, but she did tolerate HD Sun and not all options for hypotension have been exhausted. Plan for now is to continue HD and readdress goals of care if she eventually stops tolerating dialysis.   LJannifer HickMD [facility] Pager [phone]

## 2021-01-02 NOTE — Progress Notes (Signed)
Patient ID: Tina Gomez, female   DOB: 03-01-1956, 65 y.o.   MRN: [id number]  PROGRESS NOTE    ARLICIA MULTER  [id number] DOB: 1955/10/12 DOA: 12/31/2020 PCP: Charlott Rakes, MD   Brief Narrative:  65 y.o. female with medical history significant of ESRD on HD Monday Wednesday Friday, chronic combined CHF, COPD, unspecified CVA, anemia of chronic disease, nonobstructive CAD as seen on cardiac cath 3 weeks ago, hypertension, GERD, hyperlipidemia, diabetes, chronic hypoxic respiratory failure on 2 L oxygen via nasal cannula presented on 12/31/2020 with worsening shortness of breath and weight gain.  On presentation, she required 4 L oxygen to maintain saturations; hemoglobin was 6.8 down from baseline of around 8; troponin were flat at 17 and 19.  COVID-19 and flu tests were negative.  Chest x-ray showed cardiomegaly with pulmonary venous congestion.  Nephrology was consulted and patient underwent dialysis overnight on 12/31/2020.  Assessment & Plan:   Volume overload End-stage renal disease on hemodialysis Acute on chronic hypoxic respiratory failure -Patient presented with worsening shortness of breath and chest x-ray showed pulmonary venous congestion.  She required 4 L oxygen via nasal cannula and is normally on 2 L at home.  She underwent hemodialysis on presentation.  Nephrology following.  Still requiring 4 L oxygen via nasal cannula.  Wean off as able -Strict input and output.  Daily weights.  Fluid restriction  Acute on chronic anemia of chronic disease -Baseline hemoglobin around close to 8.  Hemoglobin 6.1 on 01/01/2021 requiring 1 unit packed red cells transfusion.  Hemoglobin 7.3 today.  Monitor  Chronic systolic and diastolic CHF Nonobstructive CAD Hypertension Hyperlipidemia -Last echo in March showed EF of 30% with grade 2 diastolic dysfunction.  Had a cath recently 2 weeks ago which showed nonobstructive CAD.  Volume is being managed by dialysis.  Continue aspirin, statin,  Coreg, losartan -Outpatient follow-up with cardiology  Diabetes mellitus type II -Continue CBGs with SSI  COPD Continue inhalers as needed  History of unspecified CVA -Continue home aspirin   DVT prophylaxis: Heparin Code Status:  Full Family Communication: Daughter at bedside on 01/01/2021 Disposition Plan: Status is: Observation  The patient will require care spanning > 2 midnights and should be moved to inpatient because: Inpatient level of care appropriate due to severity of illness  Dispo: The patient is from: Home              Anticipated d/c is to: Home in 1 to 2 days once cleared by nephrology              Patient currently is not medically stable to d/c.   Difficult to place patient No   Consultants: Nephrology  Procedures: None  Antimicrobials: None   Subjective: Patient seen and examined at bedside undergoing hemodialysis.  Still feels short of breath and does not feel ready to go home today.  No overnight fever or vomiting reported. Objective: Vitals:   01/02/21 0820 01/02/21 0830 01/02/21 0900 01/02/21 1030  BP: (!) 175/58 (!) 181/65 (!) 175/58 (!) 159/56  Pulse:      Resp: '19 14 15 13  '$ Temp:      TempSrc:      SpO2:      Weight:      Height:        Intake/Output Summary (Last 24 hours) at 01/02/2021 1103 Last data filed at 01/02/2021 0654 Gross per 24 hour  Intake 1052 ml  Output 2 ml  Net 1050 ml   [employer]  12/31/20 1957 01/02/21 0500 01/02/21 0755  Weight: 80.7 kg 80.9 kg 82.3 kg    Examination:  General exam: Currently still on 4 L oxygen via nasal cannula.  No distress.  Looks chronically ill and deconditioned.  Respiratory system: Decreased breath sounds at bases bilaterally with bibasilar crackles Cardiovascular system: Rate controlled, S1-S2 heard Gastrointestinal system: Abdomen is slightly distended, soft and nontender.  Bowel sounds are heard  extremities: Bilateral lower extremity edema present; no clubbing Central  nervous system: Awake and alert; very slow to respond.  No focal neurological deficits. Moving extremities Skin: No obvious ecchymosis/lesions  psychiatry: Affect is very flat    Data Reviewed: I have personally reviewed following labs and imaging studies  CBC: Recent Labs  Lab 12/31/20 1425 01/01/21 0405 01/01/21 1920 01/02/21 0543  WBC 7.6 7.5  --  7.9  NEUTROABS 5.3  --   --  5.5  HGB 6.8* 6.1*  6.3* 7.5* 7.3*  HCT 21.4* 20.1*  20.2* 24.4* 23.8*  MCV 94.3 94.4  --  94.1  PLT 257 252  --  [id number]   Basic Metabolic Panel: Recent Labs  Lab 12/31/20 1425 01/01/21 0405 01/02/21 0543  NA 138 137 141  K 3.7 3.3* 3.7  CL 98 101 103  CO2 '24 27 26  '$ GLUCOSE 143* 100* 122*  BUN 52* 18 39*  CREATININE 5.64* 2.84* 5.00*  CALCIUM 7.9* 8.2* 8.0*   GFR: Estimated Creatinine Clearance: 11.5 mL/min (A) (by C-G formula based on SCr of 5 mg/dL (H)). Liver Function Tests: Recent Labs  Lab 01/01/21 0405  AST 11*  ALT 9  ALKPHOS 61  BILITOT 0.5  PROT 5.7*  ALBUMIN 2.6*   No results for input(s): LIPASE, AMYLASE in the last 168 hours. No results for input(s): AMMONIA in the last 168 hours. Coagulation Profile: No results for input(s): INR, PROTIME in the last 168 hours. Cardiac Enzymes: No results for input(s): CKTOTAL, CKMB, CKMBINDEX, TROPONINI in the last 168 hours. BNP (last 3 results) No results for input(s): PROBNP in the last 8760 hours. HbA1C: No results for input(s): HGBA1C in the last 72 hours. CBG: Recent Labs  Lab 01/01/21 0647 01/01/21 1139 01/01/21 1635 01/01/21 2039 01/02/21 0649  GLUCAP 77 173* 127* 256* 118*   Lipid Profile: No results for input(s): CHOL, HDL, LDLCALC, TRIG, CHOLHDL, LDLDIRECT in the last 72 hours. Thyroid Function Tests: No results for input(s): TSH, T4TOTAL, FREET4, T3FREE, THYROIDAB in the last 72 hours. Anemia Panel: No results for input(s): VITAMINB12, FOLATE, FERRITIN, TIBC, IRON, RETICCTPCT in the last 72 hours. Sepsis  Labs: No results for input(s): PROCALCITON, LATICACIDVEN in the last 168 hours.  Recent Results (from the past 240 hour(s))  Resp Panel by RT-PCR (Flu A&B, Covid) Nasopharyngeal Swab     Status: None   Collection Time: 12/31/20  1:30 PM   Specimen: Nasopharyngeal Swab; Nasopharyngeal(NP) swabs in vial transport medium  Result Value Ref Range Status   SARS Coronavirus 2 by RT PCR NEGATIVE NEGATIVE Final    Comment: (NOTE) SARS-CoV-2 target nucleic acids are NOT DETECTED.  The SARS-CoV-2 RNA is generally detectable in upper respiratory specimens during the acute phase of infection. The lowest concentration of SARS-CoV-2 viral copies this assay can detect is 138 copies/mL. A negative result does not preclude SARS-Cov-2 infection and should not be used as the sole basis for treatment or other patient management decisions. A negative result may occur with  improper specimen collection/handling, submission of specimen other than nasopharyngeal swab, presence of viral mutation(s)  within the areas targeted by this assay, and inadequate number of viral copies(<138 copies/mL). A negative result must be combined with clinical observations, patient history, and epidemiological information. The expected result is Negative.  Fact Sheet for Patients:  EntrepreneurPulse.com.au  Fact Sheet for Healthcare Providers:  IncredibleEmployment.be  This test is no t yet approved or cleared by the [place] FDA and  has been authorized for detection and/or diagnosis of SARS-CoV-2 by FDA under an Emergency Use Authorization (EUA). This EUA will remain  in effect (meaning this test can be used) for the duration of the COVID-19 declaration under Section 564(b)(1) of the Act, 21 U.S.C.section 360bbb-3(b)(1), unless the authorization is terminated  or revoked sooner.       Influenza A by PCR NEGATIVE NEGATIVE Final   Influenza B by PCR NEGATIVE NEGATIVE Final     Comment: (NOTE) The Xpert Xpress SARS-CoV-2/FLU/RSV plus assay is intended as an aid in the diagnosis of influenza from Nasopharyngeal swab specimens and should not be used as a sole basis for treatment. Nasal washings and aspirates are unacceptable for Xpert Xpress SARS-CoV-2/FLU/RSV testing.  Fact Sheet for Patients: EntrepreneurPulse.com.au  Fact Sheet for Healthcare Providers: IncredibleEmployment.be  This test is not yet approved or cleared by the [place] FDA and has been authorized for detection and/or diagnosis of SARS-CoV-2 by FDA under an Emergency Use Authorization (EUA). This EUA will remain in effect (meaning this test can be used) for the duration of the COVID-19 declaration under Section 564(b)(1) of the Act, 21 U.S.C. section 360bbb-3(b)(1), unless the authorization is terminated or revoked.  Performed at [facility], [address], [place],  [address]          Radiology Studies: DG Chest Portable 1 View  Result Date: 12/31/2020 CLINICAL DATA:  Shortness of breath.  End-stage renal disease. EXAM: PORTABLE CHEST 1 VIEW COMPARISON:  December 15, 2020 FINDINGS: The right-sided double lumen catheter is in good position. No pneumothorax. Mild increased interstitial markings without overt edema. Small bilateral pleural effusions were not seen previously. No other abnormalities. IMPRESSION: 1. Cardiomegaly, mild pulmonary venous congestion/edema, and small pleural effusions. Electronically Signed   By: Dorise Bullion III M.D   On: 12/31/2020 14:45        Scheduled Meds: . atorvastatin  40 mg Oral QPM  . calcium acetate  1,334 mg Oral TID WC  . carvedilol  12.5 mg Oral BID WC  . Chlorhexidine Gluconate Cloth  6 each Topical Q0600  . [START ON 01/10/2021] darbepoetin (ARANESP) injection - DIALYSIS  200 mcg Intravenous Q Wed-HD  . doxercalciferol  2 mcg Intravenous Q M,W,F-HD  . FLUoxetine  20 mg Oral Daily   . gabapentin  100 mg Oral QHS  . heparin  5,000 Units Subcutaneous Q8H  . insulin aspart  0-6 Units Subcutaneous TID WC  . losartan  50 mg Oral Daily  . multivitamin  1 tablet Oral QHS  . sodium chloride flush  3 mL Intravenous Q12H   Continuous Infusions: . albumin human    . ferric gluconate (FERRLECIT/NULECIT) IV Stopped (01/01/21 1329)          Aline August, MD [facility] 01/02/2021, 11:03 AM

## 2021-01-02 NOTE — Progress Notes (Signed)
Pt off the unit to hemodialysis  

## 2021-01-03 ENCOUNTER — Other Ambulatory Visit: Payer: Self-pay

## 2021-01-03 ENCOUNTER — Inpatient Hospital Stay (HOSPITAL_COMMUNITY): Payer: Medicare Other

## 2021-01-03 DIAGNOSIS — E8770 Fluid overload, unspecified: Secondary | ICD-10-CM | POA: Diagnosis not present

## 2021-01-03 LAB — CBC WITH DIFFERENTIAL/PLATELET
Abs Immature Granulocytes: 0.07 10*3/uL (ref 0.00–0.07)
Basophils Absolute: 0 10*3/uL (ref 0.0–0.1)
Basophils Relative: 0 %
Eosinophils Absolute: 0.2 10*3/uL (ref 0.0–0.5)
Eosinophils Relative: 3 %
HCT: 25.2 % — ABNORMAL LOW (ref 36.0–46.0)
Hemoglobin: 7.7 g/dL — ABNORMAL LOW (ref 12.0–15.0)
Immature Granulocytes: 1 %
Lymphocytes Relative: 21 %
Lymphs Abs: 1.6 10*3/uL (ref 0.7–4.0)
MCH: 29.3 pg (ref 26.0–34.0)
MCHC: 30.6 g/dL (ref 30.0–36.0)
MCV: 95.8 fL (ref 80.0–100.0)
Monocytes Absolute: 0.8 10*3/uL (ref 0.1–1.0)
Monocytes Relative: 10 %
Neutro Abs: 5 10*3/uL (ref 1.7–7.7)
Neutrophils Relative %: 65 %
Platelets: 226 10*3/uL (ref 150–400)
RBC: 2.63 MIL/uL — ABNORMAL LOW (ref 3.87–5.11)
RDW: 16.2 % — ABNORMAL HIGH (ref 11.5–15.5)
WBC: 7.7 10*3/uL (ref 4.0–10.5)
nRBC: 0 % (ref 0.0–0.2)

## 2021-01-03 LAB — BASIC METABOLIC PANEL
Anion gap: 13 (ref 5–15)
BUN: 28 mg/dL — ABNORMAL HIGH (ref 8–23)
CO2: 26 mmol/L (ref 22–32)
Calcium: 9.1 mg/dL (ref 8.9–10.3)
Chloride: 100 mmol/L (ref 98–111)
Creatinine, Ser: 3.54 mg/dL — ABNORMAL HIGH (ref 0.44–1.00)
GFR, Estimated: 14 mL/min — ABNORMAL LOW (ref >60)
Glucose, Bld: 134 mg/dL — ABNORMAL HIGH (ref 70–99)
Potassium: 3.5 mmol/L (ref 3.5–5.1)
Sodium: 139 mmol/L (ref 135–145)

## 2021-01-03 LAB — GLUCOSE, CAPILLARY
Glucose-Capillary: 111 mg/dL — ABNORMAL HIGH (ref 70–99)
Glucose-Capillary: 91 mg/dL (ref 70–99)
Glucose-Capillary: 188 mg/dL — ABNORMAL HIGH (ref 70–99)
Glucose-Capillary: 181 mg/dL — ABNORMAL HIGH (ref 70–99)

## 2021-01-03 MED ORDER — MIDODRINE HCL 5 MG PO TABS: 10.0000 mg | ORAL_TABLET | ORAL | Status: DC

## 2021-01-03 MED ORDER — DOXERCALCIFEROL 4 MCG/2ML IV SOLN
INTRAVENOUS | Status: AC
  Administered 2021-01-03: 2 ug via INTRAVENOUS
  Filled 2021-01-03: qty 2

## 2021-01-03 MED ORDER — MIDODRINE HCL 5 MG PO TABS
ORAL_TABLET | ORAL | Status: AC
  Administered 2021-01-03: 10 mg via ORAL
  Filled 2021-01-03: qty 2

## 2021-01-03 MED ORDER — LORATADINE 10 MG PO TABS
10.0000 mg | ORAL_TABLET | Freq: Every day | ORAL | Status: DC
  Administered 2021-01-03 – 2021-01-04 (×3): 10 mg via ORAL
  Filled 2021-01-03 (×3): qty 1

## 2021-01-03 NOTE — Progress Notes (Signed)
[facility] Progress Note   Subjective:   Seen on HD.  Had symptomatic hypotension during HD yesterday.  She's early in HD treatment today and having symptomatic hypotension again 70/40s, better to 90/40s on recheck lying flat.  Cough worse overnight - having allergy symptoms too.  No fevers, chills, edema.   Objective Vitals:   01/03/21 0840 01/03/21 0845 01/03/21 0900 01/03/21 0930  BP:  (!) 149/47 (!) 167/54 (!) 127/51  Pulse:  61    Resp:  16 12   Temp:  98.2 F (36.8 C)    TempSrc:  Oral    SpO2:  98%    Weight: 79.2 kg     Height:       Physical Exam General: well developed female, alert and in NAD lying flat Heart: RRR, no murmurs, rubs or gallops Lungs: scattered rhonchi BL,  on O2 4L via [place] Abdomen: Soft, non-distended, +BS Extremities: No edema b/l lower extremities Dialysis Access: R IJ TDC with clean, dry bandage; LUE AVF 16g Qb 300  Additional Objective Labs: Basic Metabolic Panel: Recent Labs  Lab 01/01/21 0405 01/02/21 0543 01/03/21 0424  NA 137 141 139  K 3.3* 3.7 3.5  CL 101 103 100  CO2 '27 26 26  '$ GLUCOSE 100* 122* 134*  BUN 18 39* 28*  CREATININE 2.84* 5.00* 3.54*  CALCIUM 8.2* 8.0* 9.1   Liver Function Tests: Recent Labs  Lab 01/01/21 0405  AST 11*  ALT 9  ALKPHOS 61  BILITOT 0.5  PROT 5.7*  ALBUMIN 2.6*   CBC: Recent Labs  Lab 12/31/20 1425 01/01/21 0405 01/01/21 1920 01/02/21 0543 01/03/21 0424  WBC 7.6 7.5  --  7.9 7.7  NEUTROABS 5.3  --   --  5.5 5.0  HGB 6.8* 6.1*  6.3* 7.5* 7.3* 7.7*  HCT 21.4* 20.1*  20.2* 24.4* 23.8* 25.2*  MCV 94.3 94.4  --  94.1 95.8  PLT 257 252  --  240 226   CBG: Recent Labs  Lab 01/01/21 2039 01/02/21 0649 01/02/21 1304 01/02/21 1746 01/03/21 0644  GLUCAP 256* 118* 90 218* 111*   Studies/Results: No results found. Medications: . albumin human    . ferric gluconate (FERRLECIT/NULECIT) IV Stopped (01/01/21 1329)   . atorvastatin  40 mg Oral QPM  . calcium acetate   1,334 mg Oral TID WC  . carvedilol  12.5 mg Oral BID WC  . Chlorhexidine Gluconate Cloth  6 each Topical Q0600  . [START ON 01/10/2021] darbepoetin (ARANESP) injection - DIALYSIS  200 mcg Intravenous Q Wed-HD  . doxercalciferol  2 mcg Intravenous Q M,W,F-HD  . FLUoxetine  20 mg Oral Daily  . gabapentin  100 mg Oral QHS  . heparin  5,000 Units Subcutaneous Q8H  . insulin aspart  0-6 Units Subcutaneous TID WC  . loratadine  10 mg Oral Daily  . losartan  50 mg Oral Daily  . midodrine  10 mg Oral Q M,W,F-HD  . multivitamin  1 tablet Oral QHS  . sodium chloride flush  3 mL Intravenous Q12H    Outpatient Dialysis Orders: MWF AF  3h 66mn  3K/2.5 bath  AVF LUA / RIJ TDC  Heparin- none EDW 76  Mircera 2267m IV q 2 weeks- last dose 12/18/20 Venofer '100mg'$  IV q HD x 10 doses (last tsat 14, ferritin 275 on 4/20)- not started yet hectorol 2 mcg IV q HD  Assessment/Plan: 1. Dyspnea - acute on chronic resp failure, on home O2 w/ hx COPD, HFrEF and  chronic anemia.  Suspect vol excess/ pulm edema contributing. Challenging EDW as tolerated.    2. ESRD - on HD MWF schedule, had HD overnight 4/24-4/25 for volume.  Has had syncopal episodes following dialysis several times over the past 6 months and has had symptomatic hypotension yesterday and today.  Will dec machine T, profile 2 and will add midodrine.  AVF working well with 16g Qb 300 will switch to 15g next tx and if works will have [place] removed.  3. Hypertension/volume: HTN on non HD days but BPs drop with HD - adding midodrine '10mg'$  with HD today due to symptomatic hypotension.   Has been over EDW - attempting to achieve in light of symptomatic vol OL on presentation but BP has been limiting.  4. Anemia ckd - No known source of bleeding and FOBT negative. She is iron deficient per outpatient labs on 4/20. Started IV Fe here. Cont weekly high dose ESA.  Hb stable in mid 7s.  No indication for transfusion at this time. 5. Metabolic bone disease: Continue  hectorol and phosphorus binders 6. Nutrition:Alb low,continue proteinsupplements. 7. HFrEF (EF 30%): Recent LHC with non-obstructive CAD  8. COPD:per primary.Home O2 on discharge. 9. Goals of care: Wishes to continue dialysis for now.   Jannifer Hick MD [facility] Pager [phone]

## 2021-01-03 NOTE — Progress Notes (Addendum)
Patient ID: Tina Gomez, female   DOB: 1956/06/14, 65 y.o.   MRN: [id number]  PROGRESS NOTE    Tina Gomez  [id number] DOB: 12-Feb-1956 DOA: 12/31/2020 PCP: Charlott Rakes, MD   Brief Narrative:  65 y.o. female with medical history significant of ESRD on HD Monday Wednesday Friday, chronic combined CHF, COPD, unspecified CVA, anemia of chronic disease, nonobstructive CAD as seen on cardiac cath 3 weeks ago, hypertension, GERD, hyperlipidemia, diabetes, chronic hypoxic respiratory failure on 2 L oxygen via nasal cannula presented on 12/31/2020 with worsening shortness of breath and weight gain.  On presentation, she required 4 L oxygen to maintain saturations; hemoglobin was 6.8 down from baseline of around 8; troponin were flat at 17 and 19.  COVID-19 and flu tests were negative.  Chest x-ray showed cardiomegaly with pulmonary venous congestion.  Nephrology was consulted and patient underwent dialysis overnight on 12/31/2020.  Assessment & Plan:   Acute on chronic hypoxic respiratory failure Volume overload End-stage renal disease on hemodialysis -Requiring 4 L of oxygen, chest x-ray indicated pulmonary edema and small pleural effusions  -Has hypotension which limits adequate fluid removal on HD  -Started on midodrine  -Repeat chest x-ray today, wean down O2 to 2L  -Ambulate   Acute on chronic anemia of chronic disease -Baseline hemoglobin around close to 8.  Hemoglobin 6.1 on 01/01/2021 -transfused 1 unit of PRBC, also getting IV iron -Check anemia panel  Chronic systolic and diastolic CHF Nonobstructive CAD Hypertension Hyperlipidemia -Last echo in March showed EF of 30% with grade 2 diastolic dysfunction.  Had a cath recently 2 weeks ago which showed nonobstructive CAD.  Volume is being managed by dialysis.   -Continue aspirin, statin, Coreg, -Discontinue losartan with hypotension limiting HD -Outpatient follow-up with cardiology  Diabetes mellitus type II -Continue CBGs  with SSI  COPD Chronic respiratory failure on 2 L home O2 Continue inhalers as needed  History of unspecified CVA -Continue home aspirin   DVT prophylaxis: Heparin Code Status:  Full Family Communication: Spoke with patient in detail, no family at bedside Disposition Plan: Status is: Inpatient  Inpatient level of care appropriate due to severity of illness  Dispo: The patient is from: Home              Anticipated d/c is to: Home in 24 to 48 hours              Patient currently is not medically stable to d/c.   Difficult to place patient No   Consultants: Nephrology  Procedures: None  Antimicrobials: None   Subjective: Continues to have productive cough and shortness of breath Objective: Vitals:   01/03/21 0951 01/03/21 1000 01/03/21 1100 01/03/21 1130  BP: (!) 98/47 (!) 127/47 (!) 114/93 (!) 182/58  Pulse:      Resp: 16 15    Temp:      TempSrc:      SpO2:      Weight:      Height:        Intake/Output Summary (Last 24 hours) at 01/03/2021 1136 Last data filed at 01/03/2021 0815 Gross per 24 hour  Intake 800 ml  Output 2400 ml  Net -1600 ml   Filed Weights   01/02/21 0755 01/02/21 1220 01/03/21 0840  Weight: 82.3 kg 80 kg 79.2 kg    Examination:  General exam: Chronically ill female appears much older than stated age, seen on dialysis, awake alert oriented HEENT: Right IJ HD cath noted CVS: S1-S2, regular rate  rhythm Lungs: Scattered rhonchi bilaterally Abdomen: Soft, nontender, bowel sounds present  extremities: No edema Psych: Flat affect   Data Reviewed: I have personally reviewed following labs and imaging studies  CBC: Recent Labs  Lab 12/31/20 1425 01/01/21 0405 01/01/21 1920 01/02/21 0543 01/03/21 0424  WBC 7.6 7.5  --  7.9 7.7  NEUTROABS 5.3  --   --  5.5 5.0  HGB 6.8* 6.1*  6.3* 7.5* 7.3* 7.7*  HCT 21.4* 20.1*  20.2* 24.4* 23.8* 25.2*  MCV 94.3 94.4  --  94.1 95.8  PLT 257 252  --  240 [id number]   Basic Metabolic Panel: Recent  Labs  Lab 12/31/20 1425 01/01/21 0405 01/02/21 0543 01/03/21 0424  NA 138 137 141 139  K 3.7 3.3* 3.7 3.5  CL 98 101 103 100  CO2 '24 27 26 26  '$ GLUCOSE 143* 100* 122* 134*  BUN 52* 18 39* 28*  CREATININE 5.64* 2.84* 5.00* 3.54*  CALCIUM 7.9* 8.2* 8.0* 9.1   GFR: Estimated Creatinine Clearance: 15.8 mL/min (A) (by C-G formula based on SCr of 3.54 mg/dL (H)). Liver Function Tests: Recent Labs  Lab 01/01/21 0405  AST 11*  ALT 9  ALKPHOS 61  BILITOT 0.5  PROT 5.7*  ALBUMIN 2.6*   No results for input(s): LIPASE, AMYLASE in the last 168 hours. No results for input(s): AMMONIA in the last 168 hours. Coagulation Profile: No results for input(s): INR, PROTIME in the last 168 hours. Cardiac Enzymes: No results for input(s): CKTOTAL, CKMB, CKMBINDEX, TROPONINI in the last 168 hours. BNP (last 3 results) No results for input(s): PROBNP in the last 8760 hours. HbA1C: No results for input(s): HGBA1C in the last 72 hours. CBG: Recent Labs  Lab 01/01/21 2039 01/02/21 0649 01/02/21 1304 01/02/21 1746 01/03/21 0644  GLUCAP 256* 118* 90 218* 111*   Lipid Profile: No results for input(s): CHOL, HDL, LDLCALC, TRIG, CHOLHDL, LDLDIRECT in the last 72 hours. Thyroid Function Tests: No results for input(s): TSH, T4TOTAL, FREET4, T3FREE, THYROIDAB in the last 72 hours. Anemia Panel: No results for input(s): VITAMINB12, FOLATE, FERRITIN, TIBC, IRON, RETICCTPCT in the last 72 hours. Sepsis Labs: No results for input(s): PROCALCITON, LATICACIDVEN in the last 168 hours.  Recent Results (from the past 240 hour(s))  Resp Panel by RT-PCR (Flu A&B, Covid) Nasopharyngeal Swab     Status: None   Collection Time: 12/31/20  1:30 PM   Specimen: Nasopharyngeal Swab; Nasopharyngeal(NP) swabs in vial transport medium  Result Value Ref Range Status   SARS Coronavirus 2 by RT PCR NEGATIVE NEGATIVE Final    Comment: (NOTE) SARS-CoV-2 target nucleic acids are NOT DETECTED.  The SARS-CoV-2 RNA is  generally detectable in upper respiratory specimens during the acute phase of infection. The lowest concentration of SARS-CoV-2 viral copies this assay can detect is 138 copies/mL. A negative result does not preclude SARS-Cov-2 infection and should not be used as the sole basis for treatment or other patient management decisions. A negative result may occur with  improper specimen collection/handling, submission of specimen other than nasopharyngeal swab, presence of viral mutation(s) within the areas targeted by this assay, and inadequate number of viral copies(<138 copies/mL). A negative result must be combined with clinical observations, patient history, and epidemiological information. The expected result is Negative.  Fact Sheet for Patients:  EntrepreneurPulse.com.au  Fact Sheet for Healthcare Providers:  IncredibleEmployment.be  This test is no t yet approved or cleared by the [place] FDA and  has been authorized for detection and/or diagnosis  of SARS-CoV-2 by FDA under an Emergency Use Authorization (EUA). This EUA will remain  in effect (meaning this test can be used) for the duration of the COVID-19 declaration under Section 564(b)(1) of the Act, 21 U.S.C.section 360bbb-3(b)(1), unless the authorization is terminated  or revoked sooner.       Influenza A by PCR NEGATIVE NEGATIVE Final   Influenza B by PCR NEGATIVE NEGATIVE Final    Comment: (NOTE) The Xpert Xpress SARS-CoV-2/FLU/RSV plus assay is intended as an aid in the diagnosis of influenza from Nasopharyngeal swab specimens and should not be used as a sole basis for treatment. Nasal washings and aspirates are unacceptable for Xpert Xpress SARS-CoV-2/FLU/RSV testing.  Fact Sheet for Patients: EntrepreneurPulse.com.au  Fact Sheet for Healthcare Providers: IncredibleEmployment.be  This test is not yet approved or cleared by the [place] FDA and has been authorized for detection and/or diagnosis of SARS-CoV-2 by FDA under an Emergency Use Authorization (EUA). This EUA will remain in effect (meaning this test can be used) for the duration of the COVID-19 declaration under Section 564(b)(1) of the Act, 21 U.S.C. section 360bbb-3(b)(1), unless the authorization is terminated or revoked.  Performed at [facility], [address] [place], [place] [address]          Radiology Studies: No results found.      Scheduled Meds: . atorvastatin  40 mg Oral QPM  . calcium acetate  1,334 mg Oral TID WC  . carvedilol  12.5 mg Oral BID WC  . Chlorhexidine Gluconate Cloth  6 each Topical Q0600  . [START ON 01/10/2021] darbepoetin (ARANESP) injection - DIALYSIS  200 mcg Intravenous Q Wed-HD  . doxercalciferol      . doxercalciferol  2 mcg Intravenous Q M,W,F-HD  . FLUoxetine  20 mg Oral Daily  . gabapentin  100 mg Oral QHS  . heparin  5,000 Units Subcutaneous Q8H  . insulin aspart  0-6 Units Subcutaneous TID WC  . loratadine  10 mg Oral Daily  . losartan  50 mg Oral Daily  . midodrine  10 mg Oral Q M,W,F-HD  . multivitamin  1 tablet Oral QHS  . sodium chloride flush  3 mL Intravenous Q12H   Continuous Infusions: . albumin human    . ferric gluconate (FERRLECIT/NULECIT) IV Stopped (01/01/21 1329)     Domenic Polite, MD [facility] 01/03/2021, 11:36 AM

## 2021-01-03 NOTE — Progress Notes (Signed)
Pt off unit to hemodialysis. 

## 2021-01-04 ENCOUNTER — Other Ambulatory Visit (HOSPITAL_COMMUNITY): Payer: Self-pay

## 2021-01-04 ENCOUNTER — Ambulatory Visit: Payer: Medicaid Other

## 2021-01-04 ENCOUNTER — Telehealth: Payer: Self-pay | Admitting: *Deleted

## 2021-01-04 DIAGNOSIS — E8770 Fluid overload, unspecified: Secondary | ICD-10-CM | POA: Diagnosis not present

## 2021-01-04 DIAGNOSIS — J9601 Acute respiratory failure with hypoxia: Secondary | ICD-10-CM | POA: Diagnosis not present

## 2021-01-04 DIAGNOSIS — J810 Acute pulmonary edema: Secondary | ICD-10-CM

## 2021-01-04 LAB — GLUCOSE, CAPILLARY
Glucose-Capillary: 98 mg/dL (ref 70–99)
Glucose-Capillary: 136 mg/dL — ABNORMAL HIGH (ref 70–99)

## 2021-01-04 MED ORDER — GABAPENTIN 100 MG PO CAPS
100.0000 mg | ORAL_CAPSULE | Freq: Two times a day (BID) | ORAL | 0 refills | Status: DC
  Filled 2021-01-04: qty 60, 30d supply, fill #0

## 2021-01-04 MED ORDER — MIDODRINE HCL 10 MG PO TABS
10.0000 mg | ORAL_TABLET | ORAL | 0 refills | Status: DC
  Filled 2021-01-04: qty 12, 28d supply, fill #0

## 2021-01-04 MED ORDER — GABAPENTIN 100 MG PO CAPS
100.0000 mg | ORAL_CAPSULE | Freq: Two times a day (BID) | ORAL | Status: DC
  Administered 2021-01-04: 100 mg via ORAL
  Filled 2021-01-04: qty 1

## 2021-01-04 NOTE — Progress Notes (Signed)
[facility] Progress Note   Subjective:   Seen in room, daughter and hospitalist present.  Felt well after midodrine at HD yesterday.   UF 2L and she's net neg 4.3L for the admission.  EDW is 76 and she achieved that.  F/u CXR with some pulm congestion still.  She's feeling remarkably better but hasn't ambulated yet.   Objective Vitals:   01/03/21 1330 01/03/21 1700 01/03/21 2118 01/04/21 0553  BP: (!) 170/52 (!) 142/40 (!) 143/51 (!) 159/47  Pulse: (!) 58 61 62 60  Resp: '18 16 19 19  '$ Temp: 98.3 F (36.8 C) 99.1 F (37.3 C) 99.3 F (37.4 C) 98.2 F (36.8 C)  TempSrc: Oral Oral Oral   SpO2: (!) 89% 97% 91% 92%  Weight:   76 kg   Height:       Physical Exam General: well developed female, alert and in NAD lying flat Heart: RRR, no murmurs, rubs or gallops Lungs: a few basilar rales R > L that mainly clear with deep inspiration,  on O2 4L via [place] Abdomen: Soft, non-distended, +BS Extremities: No edema b/l lower extremities Dialysis Access: R IJ TDC with clean, dry bandage; LUE AVF +t/b  Additional Objective Labs: Basic Metabolic Panel: Recent Labs  Lab 01/01/21 0405 01/02/21 0543 01/03/21 0424  NA 137 141 139  K 3.3* 3.7 3.5  CL 101 103 100  CO2 '27 26 26  '$ GLUCOSE 100* 122* 134*  BUN 18 39* 28*  CREATININE 2.84* 5.00* 3.54*  CALCIUM 8.2* 8.0* 9.1   Liver Function Tests: Recent Labs  Lab 01/01/21 0405  AST 11*  ALT 9  ALKPHOS 61  BILITOT 0.5  PROT 5.7*  ALBUMIN 2.6*   CBC: Recent Labs  Lab 12/31/20 1425 01/01/21 0405 01/01/21 1920 01/02/21 0543 01/03/21 0424  WBC 7.6 7.5  --  7.9 7.7  NEUTROABS 5.3  --   --  5.5 5.0  HGB 6.8* 6.1*  6.3* 7.5* 7.3* 7.7*  HCT 21.4* 20.1*  20.2* 24.4* 23.8* 25.2*  MCV 94.3 94.4  --  94.1 95.8  PLT 257 252  --  240 226   CBG: Recent Labs  Lab 01/03/21 0644 01/03/21 1329 01/03/21 1656 01/03/21 2117 01/04/21 0642  GLUCAP 111* 91 188* 181* 98   Studies/Results: DG Chest 2 View  Result Date:  01/03/2021 CLINICAL DATA:  Hypoxia. EXAM: CHEST - 2 VIEW COMPARISON:  12/31/2020. FINDINGS: Dual-lumen catheter noted with tip over SVC. Cardiomegaly with mild bilateral interstitial prominence and small bilateral pleural effusions. Findings suggest CHF. Pneumonitis cannot be excluded. IMPRESSION: 1.  Dual-lumen catheter stable position. 2. Cardiomegaly with mild bilateral interstitial prominence suggesting interstitial edema. Small bilateral pleural effusions. Electronically Signed   By: Marcello Moores  Register   On: 01/03/2021 15:58   Medications: . albumin human    . ferric gluconate (FERRLECIT/NULECIT) IV 125 mg (01/03/21 1144)   . atorvastatin  40 mg Oral QPM  . calcium acetate  1,334 mg Oral TID WC  . carvedilol  12.5 mg Oral BID WC  . Chlorhexidine Gluconate Cloth  6 each Topical Q0600  . [START ON 01/10/2021] darbepoetin (ARANESP) injection - DIALYSIS  200 mcg Intravenous Q Wed-HD  . doxercalciferol  2 mcg Intravenous Q M,W,F-HD  . FLUoxetine  20 mg Oral Daily  . gabapentin  100 mg Oral BID  . heparin  5,000 Units Subcutaneous Q8H  . insulin aspart  0-6 Units Subcutaneous TID WC  . loratadine  10 mg Oral Daily  . midodrine  10 mg Oral Q M,W,F-HD  . multivitamin  1 tablet Oral QHS  . sodium chloride flush  3 mL Intravenous Q12H    Outpatient Dialysis Orders: MWF AF  3h 12mn  3K/2.5 bath  AVF LUA / RIJ TDC  Heparin- none EDW 76  Mircera 2248m IV q 2 weeks- last dose 12/18/20 Venofer '100mg'$  IV q HD x 10 doses (last tsat 14, ferritin 275 on 4/20)- not started yet hectorol 2 mcg IV q HD  Assessment/Plan: 1. Dyspnea - acute on chronic resp failure, on home O2 w/ hx COPD, HFrEF and chronic anemia.  Vol excess/ pulm edema contributing. Challenging EDW as tolerated - she's down to EDW but I think needs to be challenged a bit more which can be done here or outpt.  If she's able to ambulate/perform ADLs plan is d/c today.    2. ESRD - on HD MWF schedule, had HD overnight 4/24-4/25 for volume.  Has  had syncopal episodes following dialysis several times over the past 6 months and has had symptomatic hypotension yesterday and today.  Have dec machine T, profile 2 and will added midodrine '10mg'$  and yesterday that worked well.  Plan per above to challenge EDW -- try 0.5kg qtx x3.  AVF working well with 16g Qb 300 will switch to 15g next tx and if works [place] - can be done outpt. 3. Hypertension/volume: HTN on non HD days but BPs drop with HD - added midodrine '10mg'$  with HD due to symptomatic hypotension.   At EDW now, challenge. 4. Anemia ckd - No known source of bleeding and FOBT negative. She is iron deficient per outpatient labs on 4/20. Started IV Fe here. Cont weekly high dose ESA.  Hb stable in mid 7s.  No indication for transfusion at this time. 5. Metabolic bone disease: Continue hectorol and phosphorus binders 6. Nutrition:Alb low,continue proteinsupplements. 7. HFrEF (EF 30%): Recent LHC with non-obstructive CAD  8. COPD:per primary.Home O2 on discharge. 9. RLS: gabapentin 100 qhs but has pervasive daytime symptoms, ^ to 100 BID 10. Goals of care: Wishes to continue dialysis for now.   LiJannifer HickD [facility] Pager [phone]

## 2021-01-04 NOTE — Telephone Encounter (Signed)
Received call from patients daughter stating that when the pts ekg leads were removed her dialysis catheter was pulled out about an inch and a half. Pt successfully dialysed via AVF earlier today. Instructed patient not to leave the hospital and informed Dr. Stanford Breed.

## 2021-01-04 NOTE — Plan of Care (Signed)
  Problem: Education: Goal: Knowledge of General Education information will improve Description: Including pain rating scale, medication(s)/side effects and non-pharmacologic comfort measures Outcome: Progressing   Problem: Fluid Volume: Goal: Compliance with measures to maintain balanced fluid volume will improve Outcome: Progressing

## 2021-01-04 NOTE — Plan of Care (Signed)
  Problem: Education: Goal: Knowledge of General Education information will improve Description: Including pain rating scale, medication(s)/side effects and non-pharmacologic comfort measures Outcome: Progressing   Problem: Health Behavior/Discharge Planning: Goal: Ability to manage health-related needs will improve Outcome: Progressing   Problem: Clinical Measurements: Goal: Will remain free from infection Outcome: Progressing   Problem: Clinical Measurements: Goal: Respiratory complications will improve Outcome: Progressing   Problem: Pain Managment: Goal: General experience of comfort will improve Outcome: Progressing   Problem: Safety: Goal: Ability to remain free from injury will improve Outcome: Progressing   Problem: Skin Integrity: Goal: Risk for impaired skin integrity will decrease Outcome: Progressing   Problem: Fluid Volume: Goal: Compliance with measures to maintain balanced fluid volume will improve Outcome: Progressing   Problem: Health Behavior/Discharge Planning: Goal: Ability to manage health-related needs will improve Outcome: Progressing

## 2021-01-04 NOTE — Progress Notes (Signed)
DISCHARGE NOTE HOME Jahniah Tina Gomez to be discharged Home per MD order. Discussed prescriptions and follow up appointments with the patient. Prescriptions given to patient; medication list explained in detail. Patient verbalized understanding.  Skin clean, dry and intact without evidence of skin break down, no evidence of skin tears noted. IV catheter discontinued intact. Site without signs and symptoms of complications. Dressing and pressure applied. Pt denies pain at the site currently. No complaints noted.  Patient free of lines, drains, and wounds.   An After Visit Summary (AVS) was printed and given to the patient. Patient escorted via wheelchair, and discharged home via private auto.  Dolores Hoose, RN

## 2021-01-04 NOTE — Progress Notes (Signed)
SATURATION QUALIFICATIONS: (This note is used to comply with regulatory documentation for home oxygen)  Patient Saturations on Room Air at Rest = 97%  Patient Saturations on Room Air while Ambulating = 97%  Patient Saturations on 2 Liters of oxygen while Ambulating = 95%  Please briefly explain why patient needs home oxygen: Patient able to sustain O2 saturation above the 90% while ambulating. Patient is on fluid restriction and educated on fluid restriction to 1200 ml a day as a hemodialysis patient.

## 2021-01-04 NOTE — Plan of Care (Signed)
  Problem: Education: Goal: Knowledge of General Education information will improve Description: Including pain rating scale, medication(s)/side effects and non-pharmacologic comfort measures 01/04/2021 1256 by Dolores Hoose, RN Outcome: Adequate for Discharge 01/04/2021 0800 by Dolores Hoose, RN Outcome: Progressing   Problem: Health Behavior/Discharge Planning: Goal: Ability to manage health-related needs will improve Outcome: Adequate for Discharge   Problem: Clinical Measurements: Goal: Ability to maintain clinical measurements within normal limits will improve Outcome: Adequate for Discharge Goal: Will remain free from infection Outcome: Adequate for Discharge Goal: Diagnostic test results will improve Outcome: Adequate for Discharge Goal: Respiratory complications will improve Outcome: Adequate for Discharge Goal: Cardiovascular complication will be avoided Outcome: Adequate for Discharge   Problem: Activity: Goal: Risk for activity intolerance will decrease Outcome: Adequate for Discharge   Problem: Nutrition: Goal: Adequate nutrition will be maintained Outcome: Adequate for Discharge   Problem: Coping: Goal: Level of anxiety will decrease Outcome: Adequate for Discharge   Problem: Elimination: Goal: Will not experience complications related to bowel motility Outcome: Adequate for Discharge Goal: Will not experience complications related to urinary retention Outcome: Adequate for Discharge   Problem: Pain Managment: Goal: General experience of comfort will improve Outcome: Adequate for Discharge   Problem: Safety: Goal: Ability to remain free from injury will improve Outcome: Adequate for Discharge   Problem: Skin Integrity: Goal: Risk for impaired skin integrity will decrease Outcome: Adequate for Discharge   Problem: Education: Goal: Knowledge of disease and its progression will improve Outcome: Adequate for Discharge Goal: Individualized  Educational Video(s) Outcome: Adequate for Discharge   Problem: Fluid Volume: Goal: Compliance with measures to maintain balanced fluid volume will improve 01/04/2021 1256 by Dolores Hoose, RN Outcome: Adequate for Discharge 01/04/2021 0801 by Dolores Hoose, RN Outcome: Progressing   Problem: Health Behavior/Discharge Planning: Goal: Ability to manage health-related needs will improve Outcome: Adequate for Discharge   Problem: Nutritional: Goal: Ability to make healthy dietary choices will improve Outcome: Adequate for Discharge   Problem: Clinical Measurements: Goal: Complications related to the disease process, condition or treatment will be avoided or minimized Outcome: Adequate for Discharge   Problem: Fluid Volume: Goal: Compliance with measures to maintain balanced fluid volume will improve Outcome: Adequate for Discharge   Problem: Nutritional: Goal: Ability to make healthy dietary choices will improve Outcome: Adequate for Discharge

## 2021-01-05 ENCOUNTER — Telehealth: Payer: Self-pay

## 2021-01-05 NOTE — Telephone Encounter (Signed)
Transition Care Management Unsuccessful Follow-up Telephone Call  Date of discharge and from where:  [facility]  On 01/04/2021  Attempts:  1st Attempt  Reason for unsuccessful TCM follow-up call:  Left voice message unable to reach pt at this time.  Pt has scheduled appt with Dr Margarita Rana on 01/30/2021.

## 2021-01-06 DIAGNOSIS — N186 End stage renal disease: Secondary | ICD-10-CM | POA: Diagnosis not present

## 2021-01-06 DIAGNOSIS — E1122 Type 2 diabetes mellitus with diabetic chronic kidney disease: Secondary | ICD-10-CM | POA: Diagnosis not present

## 2021-01-06 DIAGNOSIS — Z992 Dependence on renal dialysis: Secondary | ICD-10-CM | POA: Diagnosis not present

## 2021-01-08 ENCOUNTER — Telehealth: Payer: Self-pay

## 2021-01-08 NOTE — Telephone Encounter (Signed)
Transition Care Management Follow-up Telephone Call  Date of discharge and from where: 01/04/2021, [facility]   How have you been since you were released from the hospital? She said she is feeling good, a lot better.   Any questions or concerns? Yes - she wants to make sure she has an appointment with Dr Margarita Rana so she can get medication refills.   Items Reviewed:  Did the pt receive and understand the discharge instructions provided? Yes   Medications obtained and verified? Yes  - she said she has all medications and did not have any questions about her med regime.   Other? No   Any new allergies since your discharge? No   Dietary orders reviewed? Yes and she said she is adhering to 32 ounces/day fluid restriction   Do you have support at home? Yes , she lives with her daughter   [place] and Equipment/Supplies: Were home health services ordered? no If so, what is the name of the agency? n/a  Has the agency set up a time to come to the patient's home? not applicable Were any new equipment or medical supplies ordered?  No What is the name of the medical supply agency? n/a Were you able to get the supplies/equipment? not applicable Do you have any questions related to the use of the equipment or supplies? No   She already has O2 from [place].  She is using it mostly at night.  She also has a working glucometer, nebulizer and pulse oximeter.   Attends HD M/W/F at [facility]: (I = Independent and D = Dependent) ADLs: independent, has walker that she uses when she goes out of the house. Doesn't need it at home.  Daughter assists with medication management   Follow up appointments reviewed:   PCP Hospital f/u appt confirmed? Yes  - Dr Margarita Rana 01/30/2021.    [facility] f/u appt confirmed? No , none scheduled at this time   Are transportation arrangements needed? No , her daughter drives her.   If their condition worsens, is the  pt aware to call PCP or go to the Emergency Dept.? Yes  Was the patient provided with contact information for the PCP's office or ED? Yes, she has the phone number for [facility].  Was to pt encouraged to call back with questions or concerns? Yes

## 2021-01-08 NOTE — Telephone Encounter (Signed)
Pt is calling back to speak to Opal Sidles

## 2021-01-08 NOTE — Telephone Encounter (Signed)
From the discharge call:   She said she is feeling good, a lot better.    she wants to make sure she has an appointment with Dr Margarita Rana so she can get medication refills.    she said she has all medications and did not have any questions about her med regime.    she said she is adhering to 32 ounces/day fluid restriction.   She already has O2 from [place]. She is using it mostly at night.    She also has a working glucometer, nebulizer and pulse oximeter.    Attends HD M/W/F at [facility] with Dr Margarita Rana 01/30/2021.

## 2021-01-08 NOTE — Telephone Encounter (Signed)
Pt is calling back to speak to Sharee Holster- [phone]

## 2021-01-08 NOTE — Telephone Encounter (Signed)
Transition Care Management Unsuccessful Follow-up Telephone Call  Date of discharge and from where:  01/04/2021, [facility]   Attempts:  2nd Attempt  Reason for unsuccessful TCM follow-up call:  Left voice message on # [phone].    Patient has appointment at [facility] with Dr Margarita Rana - 01/30/2021

## 2021-01-09 ENCOUNTER — Other Ambulatory Visit: Payer: Self-pay

## 2021-01-09 ENCOUNTER — Other Ambulatory Visit (HOSPITAL_BASED_OUTPATIENT_CLINIC_OR_DEPARTMENT_OTHER): Payer: Self-pay

## 2021-01-09 ENCOUNTER — Telehealth: Payer: Self-pay

## 2021-01-09 MED FILL — Carvedilol Tab 12.5 MG: ORAL | 30 days supply | Qty: 60 | Fill #0 | Status: AC

## 2021-01-09 NOTE — Telephone Encounter (Signed)
Opened in error

## 2021-01-10 ENCOUNTER — Other Ambulatory Visit: Payer: Self-pay

## 2021-01-10 ENCOUNTER — Other Ambulatory Visit: Payer: Self-pay | Admitting: Family Medicine

## 2021-01-10 ENCOUNTER — Other Ambulatory Visit (HOSPITAL_COMMUNITY): Payer: Self-pay

## 2021-01-10 MED ORDER — GABAPENTIN 100 MG PO CAPS
ORAL_CAPSULE | ORAL | 3 refills | Status: DC
  Filled 2021-01-10 – 2021-02-09 (×5): qty 90, 30d supply, fill #0
  Filled 2021-03-29: qty 90, 30d supply, fill #1

## 2021-01-11 ENCOUNTER — Other Ambulatory Visit: Payer: Self-pay

## 2021-01-12 ENCOUNTER — Telehealth (HOSPITAL_COMMUNITY): Payer: Self-pay

## 2021-01-12 ENCOUNTER — Other Ambulatory Visit: Payer: Self-pay

## 2021-01-12 NOTE — Telephone Encounter (Signed)
Transitions of Care Pharmacy  ° °Call attempted for a pharmacy transitions of care follow-up. HIPAA appropriate voicemail was left with call back information provided.  ° °Call attempt #1. Will follow-up in 2-3 days.  °  °

## 2021-01-17 NOTE — Discharge Summary (Signed)
Physician Discharge Summary  Tina Gomez ITG:[id number] DOB: 10-10-1955 DOA: 12/31/2020  PCP: Charlott Rakes, MD  Admit date: 12/31/2020 Discharge date: 01/04/2021  Time spent:35 minutes  Recommendations for Outpatient Follow-up:  1. PCP in 1 week 2. Monitor CBC, hemoglobin on dialysis   Discharge Diagnoses:  Acute hypoxic respiratory failure Hypotension   Volume overload Active Problems:   Type 2 diabetes mellitus with diabetic neuropathy, unspecified (HCC)   Essential hypertension, benign   GERD (gastroesophageal reflux disease)   Anemia   Emphysema of lung (HCC)   CHF (congestive heart failure) (HCC)   CAD (coronary artery disease)   Acute pulmonary edema ([place])   Discharge Condition: Stable  Diet recommendation: Renal, diabetic  Filed Weights   01/03/21 0840 01/03/21 1235 01/03/21 2118  Weight: 79.2 kg 76 kg 76 kg    History of present illness:  65 y.o.femalewith medical history significant ofESRD on HD Monday Wednesday Friday, chronic combined CHF, COPD, unspecified CVA, anemia of chronic disease, nonobstructive CAD as seen on cardiac cath 3 weeks ago, hypertension, GERD, hyperlipidemia, diabetes, chronic hypoxic respiratory failure on 2 L oxygen via nasal cannula presented on 12/31/2020 with worsening shortness of breath and weight gain.  On presentation, she required 4 L oxygen to maintain saturations; hemoglobin was 6.8 down from baseline of around 8; troponin were flat at 17 and 19.  COVID-19 and flu tests were negative.  Chest x-ray showed cardiomegaly with pulmonary venous congestion.  Nephrology was consulted and patient underwent dialysis overnight on 12/31/2020.  Hospital Course:   Acute on chronic hypoxic respiratory failure Volume overload End-stage renal disease on hemodialysis -Requiring 4 L of oxygen, chest x-ray indicated pulmonary edema and small pleural effusions  -Has hypotension which limits adequate fluid removal on HD  -Started on midodrine   -Weaned off oxygen -Discharged home in a stable condition, advised to take midodrine on dialysis days  Acute on chronic anemia of chronic disease -Baseline hemoglobin around close to 8.  Hemoglobin 6.1 on 01/01/2021 -Hemoccult negative, no overt blood loss, given IV iron here and continued on high-dose Epo with HD -Continue to monitor hemoglobin dialysis  Chronic systolic and diastolic CHF Nonobstructive CAD Hypertension Hyperlipidemia -Last echo in March showed EF of 30% with grade 2 diastolic dysfunction.  Had a cath recently 2 weeks ago which showed nonobstructive CAD.  Volume is being managed by dialysis.   -Continue aspirin, statin, Coreg, -Discontinued losartan with hypotension limiting HD -Outpatient follow-up with cardiology  Diabetes mellitus type II -Stable  COPD Chronic respiratory failure on 2 L home O2 Continue inhalers as needed  History of unspecified CVA -Continue home aspirin   Discharge Exam: Vitals:   01/04/21 0900 01/04/21 1248  BP: (!) 146/54   Pulse: (!) 57 (!) 58  Resp: 16 18  Temp: 99.2 F (37.3 C)   SpO2: 97% 94%    General:AAOx3 HEENT: Right IJ HD catheter Cardiovascular: S1S2/RRR Respiratory: CTAB  Discharge Instructions   Discharge Instructions    Increase activity slowly   Complete by: As directed      Allergies as of 01/04/2021      Reactions   Codeine Nausea And Vomiting   Adhesive [tape] Other (See Comments)   Tape breaks out the skin if it is left on for a lengthy period of time      Medication List    STOP taking these medications   aspirin EC 325 MG tablet   gabapentin 100 MG capsule Commonly known as: NEURONTIN   losartan 50  MG tablet Commonly known as: COZAAR     TAKE these medications   atorvastatin 40 MG tablet Commonly known as: LIPITOR TAKE 1 TABLET BY MOUTH DAILY AT 6 PM. What changed:   how much to take  how to take this  when to take this  additional instructions   blood glucose  meter kit and supplies Use as instructed   True Metrix Meter Devi 1 each by Does not apply route 3 (three) times daily before meals.   calcium acetate 667 MG capsule Commonly known as: PHOSLO Take 1 capsule (667 mg total) by mouth with snacks. What changed:   how much to take  when to take this  additional instructions   carvedilol 12.5 MG tablet Commonly known as: COREG TAKE 1 TABLET (12.5 MG TOTAL) BY MOUTH 2 (TWO) TIMES DAILY WITH A MEAL. What changed: how much to take   DIALYVITE 800 WITH ZINC 0.8 MG Tabs Take 1 tablet by mouth at bedtime.   FLUoxetine 20 MG capsule Commonly known as: PROzac Take 1 capsule (20 mg total) by mouth daily.   freestyle lancets Use as instructed   TRUEplus Lancets 28G Misc 1 each by Does not apply route 3 (three) times daily before meals.   hydrOXYzine 25 MG tablet Commonly known as: ATARAX/VISTARIL Take 1 tablet (25 mg total) by mouth 3 (three) times daily as needed. What changed: reasons to take this   lidocaine-prilocaine cream Commonly known as: EMLA Apply 1 application topically See admin instructions. APPLY SMALL AMOUNT TO ACCESS SITE (AVF) 1 TO 2 HOURS BEFORE DIALYSIS. COVER WITH OCCLUSIVE DRESSING (SARAN WRAP).   metoCLOPramide 5 MG tablet Commonly known as: REGLAN TAKE 1 TABLET BY MOUTH FOUR TIMES DAILY, BEFORE MEALS AND AT BEDTIME. What changed:   how much to take  how to take this  when to take this  reasons to take this   midodrine 10 MG tablet Commonly known as: PROAMATINE Take 1 tablet (10 mg total) by mouth every Monday, Wednesday, and Friday with hemodialysis.   ProAir HFA 108 (90 Base) MCG/ACT inhaler Generic drug: albuterol Inhale 2 puffs into the lungs every 6 (six) hours as needed for wheezing or shortness of breath.   albuterol (2.5 MG/3ML) 0.083% nebulizer solution Commonly known as: PROVENTIL Take 3 mLs (2.5 mg total) by nebulization every 6 (six) hours as needed for wheezing or shortness of  breath.   TRUEtest Test test strip Generic drug: glucose blood Use as instructed      Allergies  Allergen Reactions  . Codeine Nausea And Vomiting  . Adhesive [Tape] Other (See Comments)    Tape breaks out the skin if it is left on for a lengthy period of time    Follow-up Information    Charlott Rakes, MD. Schedule an appointment as soon as possible for a visit in 1 week(s).   Specialty: Family Medicine Contact information: [place]  [phone]                The results of significant diagnostics from this hospitalization (including imaging, microbiology, ancillary and laboratory) are listed below for reference.    Significant Diagnostic Studies: DG Chest 2 View  Result Date: 01/03/2021 CLINICAL DATA:  Hypoxia. EXAM: CHEST - 2 VIEW COMPARISON:  12/31/2020. FINDINGS: Dual-lumen catheter noted with tip over SVC. Cardiomegaly with mild bilateral interstitial prominence and small bilateral pleural effusions. Findings suggest CHF. Pneumonitis cannot be excluded. IMPRESSION: 1.  Dual-lumen catheter stable position. 2. Cardiomegaly with  mild bilateral interstitial prominence suggesting interstitial edema. Small bilateral pleural effusions. Electronically Signed   By: Marcello Moores  Register   On: 01/03/2021 15:58   DG Chest Portable 1 View  Result Date: 12/31/2020 CLINICAL DATA:  Shortness of breath.  End-stage renal disease. EXAM: PORTABLE CHEST 1 VIEW COMPARISON:  December 15, 2020 FINDINGS: The right-sided double lumen catheter is in good position. No pneumothorax. Mild increased interstitial markings without overt edema. Small bilateral pleural effusions were not seen previously. No other abnormalities. IMPRESSION: 1. Cardiomegaly, mild pulmonary venous congestion/edema, and small pleural effusions. Electronically Signed   By: Dorise Bullion III M.D   On: 12/31/2020 14:45    Microbiology: No results found for this or any previous visit (from the  past 240 hour(s)).   Labs: Basic Metabolic Panel: No results for input(s): NA, K, CL, CO2, GLUCOSE, BUN, CREATININE, CALCIUM, MG, PHOS in the last 168 hours. Liver Function Tests: No results for input(s): AST, ALT, ALKPHOS, BILITOT, PROT, ALBUMIN in the last 168 hours. No results for input(s): LIPASE, AMYLASE in the last 168 hours. No results for input(s): AMMONIA in the last 168 hours. CBC: No results for input(s): WBC, NEUTROABS, HGB, HCT, MCV, PLT in the last 168 hours. Cardiac Enzymes: No results for input(s): CKTOTAL, CKMB, CKMBINDEX, TROPONINI in the last 168 hours. BNP: BNP (last 3 results) Recent Labs    05/25/20 0032 11/07/20 1215 12/13/20 0243  BNP 209.4* 1,369.1* 1,380.6*    ProBNP (last 3 results) No results for input(s): PROBNP in the last 8760 hours.  CBG: No results for input(s): GLUCAP in the last 168 hours.     Signed:  Domenic Polite MD.  [facility] 01/17/2021, 4:59 PM

## 2021-01-19 ENCOUNTER — Telehealth (HOSPITAL_COMMUNITY): Payer: Self-pay | Admitting: Pharmacist

## 2021-01-19 ENCOUNTER — Telehealth: Payer: Self-pay

## 2021-01-19 ENCOUNTER — Other Ambulatory Visit (HOSPITAL_COMMUNITY): Payer: Self-pay

## 2021-01-19 NOTE — Telephone Encounter (Signed)
Received referral from Dr. Moshe Cipro for cath removal per patient request - sent to schedule

## 2021-01-19 NOTE — Telephone Encounter (Signed)
Pharmacy Transitions of Care Follow-up Telephone Call  Date of discharge:01/04/2021 Discharge Diagnosis: ESRD on dialysis; hypotension  Medication changes made at discharge:  - START: Midodrine 10 mg MWF with dialysis   - CHANGED: Carvedilol 12.5 mg bid; calcium acetate 667 mg with snacks, metoclopramide 5 mg four times daily (with meals and bedtime)  Medication changes verified by the patient? Yes    Medication Accessibility:  Home Pharmacy: [facility]  Was the patient provided with refills on discharged medications? yes  Have all prescriptions been transferred from [facility] to home pharmacy? Patient     Medication Review:  We were asked by Dr. Broadus John to follow up with the patient regarding midodrine Midodrine-  Patient states that she has been taking midodrine during dialysis because her blood pressure drops during dialysis. Midodrine is removed through dialysis. She is not taking it at other times. I reviewed common side effects with her today and she denies having any shivering, goosebumps, tingling, etc.   Patient states she is checking blood pressure daily at home. She states that her BP has been running high lately- last night it was 198/66. She took her second scheduled dose of carvedilol and BP came down to 177/67. I explained that these were very high measurements. She is scheduled for dialysis today. She has not checked her BP today.   I called the [facility] and spoke with Tillman Abide, RN regarding her recent BP elevations and asked that a provider see patient today during dialysis to assess recent BP elevations.     Follow-up Appointments:  PCP Hospital f/u appt confirmed? Yes Scheduled to see Dr. Margarita Rana at [facility] 01/30/21  Will have dialysis today at [facility].   If their condition worsens, is the pt aware to call PCP or go to the Emergency Dept.? Yes  Final Patient Assessment: Patient's reported BP is elevated above goal  and she needs to be assessed today during dialysis for BP elevations.  Beside elevated BP, patient seems to be tolerating midodrine without adverse effects.   Balbina Depace D. Donneta Romberg, PharmD, BCPS, [place]  [phone]

## 2021-01-22 ENCOUNTER — Other Ambulatory Visit: Payer: Self-pay | Admitting: *Deleted

## 2021-01-22 ENCOUNTER — Telehealth: Payer: Self-pay | Admitting: *Deleted

## 2021-01-22 NOTE — Telephone Encounter (Signed)
dialysis catheter to be removed on 6/2 at 11:00 at the [place] infusion center.

## 2021-01-24 ENCOUNTER — Other Ambulatory Visit: Payer: Self-pay

## 2021-01-24 ENCOUNTER — Other Ambulatory Visit: Payer: Self-pay | Admitting: Internal Medicine

## 2021-01-24 MED ORDER — CALCIUM ACETATE (PHOS BINDER) 667 MG PO CAPS
667.0000 mg | ORAL_CAPSULE | ORAL | 0 refills | Status: AC
  Filled 2021-01-24: qty 90, 30d supply, fill #0

## 2021-01-24 MED FILL — Metoclopramide HCl Tab 5 MG (Base Equivalent): ORAL | 30 days supply | Qty: 120 | Fill #0 | Status: AC

## 2021-01-25 ENCOUNTER — Other Ambulatory Visit: Payer: Self-pay

## 2021-01-30 ENCOUNTER — Other Ambulatory Visit: Payer: Self-pay

## 2021-01-30 ENCOUNTER — Encounter: Payer: Self-pay | Admitting: Family Medicine

## 2021-01-30 ENCOUNTER — Ambulatory Visit: Payer: Medicaid Other | Attending: Family Medicine | Admitting: Family Medicine

## 2021-01-30 VITALS — BP 195/72 | HR 64 | Ht 62.0 in | Wt 172.4 lb

## 2021-01-30 DIAGNOSIS — D649 Anemia, unspecified: Secondary | ICD-10-CM | POA: Diagnosis not present

## 2021-01-30 DIAGNOSIS — H04301 Unspecified dacryocystitis of right lacrimal passage: Secondary | ICD-10-CM | POA: Diagnosis not present

## 2021-01-30 DIAGNOSIS — J439 Emphysema, unspecified: Secondary | ICD-10-CM

## 2021-01-30 DIAGNOSIS — N186 End stage renal disease: Secondary | ICD-10-CM | POA: Diagnosis not present

## 2021-01-30 DIAGNOSIS — E785 Hyperlipidemia, unspecified: Secondary | ICD-10-CM

## 2021-01-30 DIAGNOSIS — R152 Fecal urgency: Secondary | ICD-10-CM

## 2021-01-30 DIAGNOSIS — N185 Chronic kidney disease, stage 5: Secondary | ICD-10-CM

## 2021-01-30 DIAGNOSIS — I120 Hypertensive chronic kidney disease with stage 5 chronic kidney disease or end stage renal disease: Secondary | ICD-10-CM

## 2021-01-30 DIAGNOSIS — E1169 Type 2 diabetes mellitus with other specified complication: Secondary | ICD-10-CM | POA: Diagnosis not present

## 2021-01-30 DIAGNOSIS — Z992 Dependence on renal dialysis: Secondary | ICD-10-CM

## 2021-01-30 DIAGNOSIS — R159 Full incontinence of feces: Secondary | ICD-10-CM

## 2021-01-30 DIAGNOSIS — I5021 Acute systolic (congestive) heart failure: Secondary | ICD-10-CM | POA: Diagnosis not present

## 2021-01-30 DIAGNOSIS — E1122 Type 2 diabetes mellitus with diabetic chronic kidney disease: Secondary | ICD-10-CM

## 2021-01-30 DIAGNOSIS — I110 Hypertensive heart disease with heart failure: Secondary | ICD-10-CM | POA: Diagnosis not present

## 2021-01-30 MED ORDER — BACITRACIN-POLYMYXIN B 500-10000 UNIT/GM OP OINT
1.0000 "application " | TOPICAL_OINTMENT | Freq: Two times a day (BID) | OPHTHALMIC | 0 refills | Status: DC
  Filled 2021-01-30: qty 3.5, 5d supply, fill #0

## 2021-01-30 MED ORDER — MISC. DEVICES MISC
0 refills | Status: DC
  Filled 2021-01-30: qty 1, fill #0

## 2021-01-30 MED ORDER — SPIRIVA RESPIMAT 2.5 MCG/ACT IN AERS
2.0000 | INHALATION_SPRAY | Freq: Every day | RESPIRATORY_TRACT | 3 refills | Status: DC
  Filled 2021-01-30: qty 4, 30d supply, fill #0
  Filled 2021-03-29: qty 4, 30d supply, fill #1
  Filled 2021-05-18: qty 4, 30d supply, fill #2
  Filled 2021-07-11: qty 4, 30d supply, fill #3

## 2021-01-30 MED ORDER — MIDODRINE HCL 10 MG PO TABS
10.0000 mg | ORAL_TABLET | ORAL | 1 refills | Status: DC
  Filled 2021-01-30: qty 15, 34d supply, fill #0

## 2021-01-30 MED ORDER — MISC. DEVICES MISC: 0 refills | Status: DC

## 2021-01-30 NOTE — Progress Notes (Signed)
Subjective:  Patient ID: Tina Gomez, female    DOB: 26-Dec-1955  Age: 65 y.o. MRN: [id number]  CC: Hospitalization Follow-up   HPI Tina Gomez is a65year-old female withahistory of hypertension, hyperlipidemia, type 2 diabetes mellitus (diet controlled withA1c6.4), GERD,COPD, ESRD on HD (M,W,[place]), hypertensive heart disease (EF 30% from echo 11/2020) She is accompanied by her daughter to today's visit.  Interval History: She currently has fecal incontinence especially after dialysis sessions. Complains of gas pains and sometimes is unable to tell the difference between gas and Diarrhea and needs pull up Depends She is concerned that her labs take a week or more to get her resuls and she has to wait 3 weeks for an appointment for transfusion which is being ordered by the dialysis center.  Her nephrologist is Dr. Moshe Cipro with [facility].  She would like her labs to be done at this clinic in order to be aware of her anemia and time and the need for transfusion as daughter has had to call for fevers at [facility] to expedite transfusion.  Her blood pressure is significantly elevated but she is yet to take her medications as she has been instructed by the dialysis unit to take her medications in the afternoons.  Surprisingly she has to take midodrine on dialysis days in addition to skipping her antihypertensives due to low blood pressure.  Has cut back on cigarette smoking and now does 3 cig/day. Complains of discharge from her right eye which has occurred in the past.  She notices crusting and whitish discharge on waking up. During her most recent hospitalization there was a question of initiating Spiriva for her emphysema and she is requesting a prescription for this. Past Medical History:  Diagnosis Date  . Anemia   . Chronic kidney disease   . COPD (chronic obstructive pulmonary disease) ([place]) 05/2019   no inhaler  . Diabetes mellitus  without complication ([place])    no meds - diet controlled  . History of blood transfusion 09/2020   1 unit  . HLD (hyperlipidemia)   . Hypertension   . PONV (postoperative nausea and vomiting)   . Restless legs syndrome (RLS)   . Stroke [facility])     Past Surgical History:  Procedure Laterality Date  . AV FISTULA PLACEMENT Left 07/27/2020   Procedure: LEFT BRACHIO-BASILIC ARTERIOVENOUS (AV) FISTULA CREATION;  Surgeon: Cherre Robins, MD;  Location: [place];  Service: Vascular;  Laterality: Left;  . BASCILIC VEIN TRANSPOSITION Left 11/02/2020   Procedure: LEFT UPPER ARM SECOND STAGE BASILIC VEIN TRANSPOSITION;  Surgeon: Cherre Robins, MD;  Location: [place];  Service: Vascular;  Laterality: Left;  PERIPHERAL NERVE BLOCK  . CESAREAN SECTION     x 1  . ESOPHAGOGASTRODUODENOSCOPY (EGD) WITH PROPOFOL N/A 05/26/2020   Procedure: ESOPHAGOGASTRODUODENOSCOPY (EGD) WITH PROPOFOL;  Surgeon: Irene Shipper, MD;  Location: [facility] ENDOSCOPY;  Service: Endoscopy;  Laterality: N/A;  . INSERTION OF DIALYSIS CATHETER Right 07/27/2020   Procedure: INSERTION OF RIGHT INTERNAL JUGULAR TUNNELED DIALYSIS CATHETER;  Surgeon: Cherre Robins, MD;  Location: [place];  Service: Vascular;  Laterality: Right;  . IR FLUORO GUIDE CV LINE RIGHT  07/25/2020  . IR US GUIDE VASC ACCESS RIGHT  07/25/2020  . LEFT HEART CATH AND CORONARY ANGIOGRAPHY N/A 11/28/2020   Procedure: LEFT HEART CATH AND CORONARY ANGIOGRAPHY;  Surgeon: [place] Sine, MD;  Location: [place] CV LAB;  Service: Cardiovascular;  Laterality: N/A;  . TONSILLECTOMY    .  UPPER GI ENDOSCOPY     growth removed from voice box  . WRIST SURGERY Left    ganglion cyst removal    Family History  Problem Relation Age of Onset  . Stroke Mother   . Hypertension Mother   . Hyperlipidemia Mother   . Hypertension Father   . Hyperlipidemia Father     Allergies  Allergen Reactions  . Codeine Nausea And Vomiting  . Adhesive [Tape] Other (See Comments)    Tape  breaks out the skin if it is left on for a lengthy period of time    Outpatient Medications Prior to Visit  Medication Sig Dispense Refill  . albuterol (PROVENTIL) (2.5 MG/3ML) 0.083% nebulizer solution Take 3 mLs (2.5 mg total) by nebulization every 6 (six) hours as needed for wheezing or shortness of breath. 75 mL 1  . albuterol (VENTOLIN HFA) 108 (90 Base) MCG/ACT inhaler Inhale 2 puffs into the lungs every 6 (six) hours as needed for wheezing or shortness of breath. 8.5 g 1  . atorvastatin (LIPITOR) 40 MG tablet TAKE 1 TABLET BY MOUTH DAILY AT 6 PM. (Patient taking differently: Take 40 mg by mouth daily at 6 PM.) 90 tablet 1  . B Complex-C-Zn-Folic Acid (DIALYVITE 976 WITH ZINC) 0.8 MG TABS Take 1 tablet by mouth at bedtime.    . Blood Glucose Monitoring Suppl (BLOOD GLUCOSE METER) kit Use as instructed 1 each 0  . Blood Glucose Monitoring Suppl (TRUE METRIX METER) DEVI 1 each by Does not apply route 3 (three) times daily before meals. 1 each 0  . calcium acetate (PHOSLO) 667 MG capsule Take 1 capsule (667 mg total) by mouth with snacks. 90 capsule 0  . carvedilol (COREG) 12.5 MG tablet TAKE 1 TABLET (12.5 MG TOTAL) BY MOUTH 2 (TWO) TIMES DAILY WITH A MEAL. (Patient taking differently: Take 12.5 mg by mouth 2 (two) times daily with a meal.) 60 tablet 3  . FLUoxetine (PROZAC) 20 MG capsule Take 1 capsule (20 mg total) by mouth daily. 30 capsule 3  . gabapentin (NEURONTIN) 100 MG capsule Take 1 capsule by mouth three times a day 90 capsule 3  . glucose blood (TRUETEST TEST) test strip Use as instructed 100 each 12  . hydrOXYzine (ATARAX/VISTARIL) 25 MG tablet Take 1 tablet (25 mg total) by mouth 3 (three) times daily as needed. (Patient taking differently: Take 25 mg by mouth 3 (three) times daily as needed for anxiety.) 60 tablet 1  . Lancets (FREESTYLE) lancets Use as instructed 100 each 12  . lidocaine-prilocaine (EMLA) cream Apply 1 application topically See admin instructions. APPLY SMALL  AMOUNT TO ACCESS SITE (AVF) 1 TO 2 HOURS BEFORE DIALYSIS. COVER WITH OCCLUSIVE DRESSING (SARAN WRAP).    Marland Kitchen metoCLOPramide (REGLAN) 5 MG tablet TAKE 1 TABLET BY MOUTH FOUR TIMES DAILY, BEFORE MEALS AND AT BEDTIME. (Patient taking differently: Take 5 mg by mouth daily as needed for nausea or vomiting.) 120 tablet 1  . midodrine (PROAMATINE) 10 MG tablet Take 1 tablet (10 mg total) by mouth every Monday, Wednesday, and Friday with hemodialysis. 20 tablet 0  . TRUEplus Lancets 28G MISC 1 each by Does not apply route 3 (three) times daily before meals. 100 each 12   Facility-Administered Medications Prior to Visit  Medication Dose Route Frequency Provider Last Rate Last Admin  . sodium chloride flush (NS) 0.9 % injection 3 mL  3 mL Intravenous Q12H Almyra Deforest, PA         ROS Review of Systems  Constitutional: Negative for activity change, appetite change and fatigue.  HENT: Negative for congestion, sinus pressure and sore throat.   Eyes: Negative for visual disturbance.  Respiratory: Negative for cough, chest tightness, shortness of breath and wheezing.   Cardiovascular: Negative for chest pain and palpitations.  Gastrointestinal: Positive for diarrhea. Negative for abdominal distention, abdominal pain and constipation.  Endocrine: Negative for polydipsia.  Genitourinary: Negative for dysuria and frequency.  Musculoskeletal: Negative for arthralgias and back pain.  Skin: Negative for rash.  Neurological: Negative for tremors, light-headedness and numbness.  Hematological: Does not bruise/bleed easily.  Psychiatric/Behavioral: Negative for agitation and behavioral problems.    Objective:  BP (!) 195/72   Pulse 64   Ht 5' 2"  (1.575 m)   Wt 172 lb 6.4 oz (78.2 kg)   LMP  (LMP Unknown)   SpO2 98%   BMI 31.53 kg/m   BP/Weight 01/30/2021 01/04/2021 0/93/1121  Systolic BP 624 469 -  Diastolic BP 72 54 -  Wt. (Lbs) 172.4 - 167.55  BMI 31.53 - 30.65      Physical Exam Constitutional:       Appearance: She is well-developed.  Eyes:     Comments: Slight ptosis of right eyelid.  No discharge appreciated  Neck:     Vascular: No JVD.  Cardiovascular:     Rate and Rhythm: Normal rate.     Heart sounds: Normal heart sounds. No murmur heard.   Pulmonary:     Effort: Pulmonary effort is normal.     Breath sounds: Normal breath sounds. No wheezing or rales.     Comments: Port-A-Cath in right upper chest wall Chest:     Chest wall: No tenderness.  Abdominal:     General: Bowel sounds are normal. There is no distension.     Palpations: Abdomen is soft. There is no mass.     Tenderness: There is no abdominal tenderness.  Musculoskeletal:        General: Normal range of motion.     Right lower leg: No edema.     Left lower leg: No edema.     Comments: AV fistula in the left arm with superficial bruising  Neurological:     Mental Status: She is alert and oriented to person, place, and time.  Psychiatric:        Mood and Affect: Mood normal.     CMP Latest Ref Rng & Units 01/03/2021 01/02/2021 01/01/2021  Glucose 70 - 99 mg/dL 134(H) 122(H) 100(H)  BUN 8 - 23 mg/dL 28(H) 39(H) 18  Creatinine 0.44 - 1.00 mg/dL 3.54(H) 5.00(H) 2.84(H)  Sodium 135 - 145 mmol/L 139 141 137  Potassium 3.5 - 5.1 mmol/L 3.5 3.7 3.3(L)  Chloride 98 - 111 mmol/L 100 103 101  CO2 22 - 32 mmol/L 26 26 27   Calcium 8.9 - 10.3 mg/dL 9.1 8.0(L) 8.2(L)  Total Protein 6.5 - 8.1 g/dL - - 5.7(L)  Total Bilirubin 0.3 - 1.2 mg/dL - - 0.5  Alkaline Phos 38 - 126 U/L - - 61  AST 15 - 41 U/L - - 11(L)  ALT 0 - 44 U/L - - 9    Lipid Panel     Component Value Date/Time   CHOL 251 (H) 11/25/2018 1506   TRIG 272 (H) 11/25/2018 1506   HDL 44 11/25/2018 1506   CHOLHDL 5.7 (H) 11/25/2018 1506   CHOLHDL 4.8 11/13/2016 1018   VLDL 40 (H) 02/15/2016 0907   LDLCALC 153 (H) 11/25/2018 1506  CBC    Component Value Date/Time   WBC 7.7 01/03/2021 0424   RBC 2.63 (L) 01/03/2021 0424   HGB 7.7 (L)  01/03/2021 0424   HGB 8.4 (L) 11/24/2020 1304   HCT 25.2 (L) 01/03/2021 0424   HCT 25.8 (L) 11/24/2020 1304   PLT 226 01/03/2021 0424   PLT 376 11/24/2020 1304   MCV 95.8 01/03/2021 0424   MCV 89 11/24/2020 1304   MCH 29.3 01/03/2021 0424   MCHC 30.6 01/03/2021 0424   RDW 16.2 (H) 01/03/2021 0424   RDW 17.0 (H) 11/24/2020 1304   LYMPHSABS 1.6 01/03/2021 0424   MONOABS 0.8 01/03/2021 0424   EOSABS 0.2 01/03/2021 0424   BASOSABS 0.0 01/03/2021 0424    Lab Results  Component Value Date   HGBA1C 6.4 (H) 11/08/2020    Assessment & Plan:  1. Hypertensive heart disease with acute systolic congestive heart failure (HCC) EF of 30% from echo of 11/2020 High risk patient with risk of fluid overload but she is euvolemic at this time and needs to keep appointment with hemodialysis sessions Continue current medications  2. Symptomatic anemia I am unsure if her nephrologist needs to order her transfusion as well as her CBCs Will look into options available for expedited transfusion in the event that she is anemic to prevent 3-week wait.  She might also need Lasix to prevent fluid overload in the setting of her CHF and ESRD.  This is a difficult situation.  The patient's daughter will get back to me regarding avenues where she has obtained expedited appointments as she is not open to the patient case and due to the long wait. - CBC with Differential/Platelet; Future  3. End stage renal disease on dialysis due to type 2 diabetes mellitus ([place]) Currently on hemodialysis  4. Hyperlipidemia associated with type 2 diabetes mellitus ([place]) Uncontrolled Unable to draw lipid panel today as she is not fasting Continue statin Low-cholesterol diet  5. Hypertension in stage 5 chronic kidney disease due to type 2 diabetes mellitus ([place]) Uncontrolled hypertension She is yet to take her antihypertensive No intervention in the clinic and she has been advised to take antihypertensive when she gets  home She skips her antihypertensive on dialysis days and takes midodrine due to hypotension  6. Incontinence of feces with fecal urgency Worse on dialysis days Will order depends - Misc. Devices MISC; Large depends. Diagnosis - fecal incontinence  Dispense: 1 each; Refill: 0  7. Infection of right lacrimal duct Advised to use warm compress Placed on topical ophthalmic ointment - bacitracin-polymyxin b (POLYSPORIN) ophthalmic ointment; Place 1 application into the right eye every 12 (twelve) hours. apply to eye every 12 hours while awake  Dispense: 3.5 g; Refill: 0  8. Pulmonary emphysema, unspecified emphysema type ([place]) Suboptimally controlled We will initiate Spiriva Counseled on smoking cessation - Tiotropium Bromide Monohydrate (SPIRIVA RESPIMAT) 2.5 MCG/ACT AERS; Inhale 2 puffs into the lungs daily.  Dispense: 4 g; Refill: 3    No orders of the defined types were placed in this encounter.  Return in about 3 months (around 05/02/2021) for Medical conditions.       Charlott Rakes, MD, FAAFP. [facility] [place], [place]   01/30/2021, 3:15 PM

## 2021-01-30 NOTE — Patient Instructions (Signed)
Dacryocystitis Dacryocystitis is an infection of the sac that collects tears (lacrimal sac). The lacrimal sac is located between the inner corner of the eye and the nose. The glands of the eyelids make tears that keep the surface of the eye wet and protected. Tears drain from two small tubes (ducts) in the eyelids. These ducts carry tears to the lacrimal sac. Another tube (nasolacrimal duct) carries tears from the lacrimal sac down into the back of the nose to the throat. Dacryocystitis can be sudden (acute) or long-lasting (chronic). It usually affects only one eye. What are the causes? The most common cause of this condition is a blocked nasolacrimal duct. When this duct is blocked, tears cannot drain into the nose, and tears become backed up in the lacrimal sac. Bacteria that normally live in the eye, on the skin, or in the nose start to grow inside the sac and cause infection. The nasolacrimal duct may become blocked because of:  A nose or sinus infection that spreads into the duct.  A duct that is abnormally shaped (malformed).  A growth or swelling in the nose.  An injury or surgery that narrows or scars the duct. Dacryocystitis also may start as an eye infection that spreads to the lacrimal sac. Sometimes, the cause of dacryocystitis is not known. What increases the risk? You are more likely to develop this condition if you:  Are older than 64 years of age.  Are female. Women tend to have a narrower nasolacrimal duct than men.  Have had nasal trauma, such as a broken nose or nasal surgery.  Have nasal polyps. What are the signs or symptoms? Symptoms of acute dacryocystitis start suddenly and may include:  Excessive tearing.  A matted, watery eye.  Swelling and redness over the lacrimal sac.  Discharge of mucus or pus into the eye. This may cause blurred vision.  Eye pain.  A fever. Symptoms of chronic dacryocystitis usually include:  More tearing than  usual.  Discharge of mucus or pus into the eye.  Blurred vision. Redness, pain, and swelling are less common with chronic dacryocystitis.   How is this diagnosed? This condition is diagnosed based on your medical history and a physical exam. During the exam, your health care provider may press between your eye and the side of your nose to see if discharge flows back into your eye. You may also have tests, such as:  Removal of a sample of discharge from your eye or nose to check for infection.  A test where your health care provider will put a yellow dye in your eye to see if the dye disappears from your eye (dye disappearance test). A swab may be placed in your nose to see if the dye drains to your nose.  A test where a thin, lighted scope (endoscope) is placed in your nose to determine what is causing the duct blockage (nasal endoscopy). How is this treated? Acute dacryocystitis is treated with antibiotic medicines. These are usually given by mouth (orally), but they can also be given as eye drops or ointments. If the infection has spread to tissues around the eye (orbital cellulitis), antibiotics may be given through an IV. Chronic dacryocystitis usually needs to be treated with surgery. Surgical options include:  Probing the duct to open it.  Widening the duct.  Removing a nasal blockage. Follow these instructions at home: Medicines  Take over-the-counter and prescription medicines only as told by your health care provider.  Take or apply your  antibiotic medicine, drops, or ointment as told by your health care provider. Do not stop taking or applying the antibiotic even if you start to feel better. General instructions  If directed by your health care provider, apply a clean, warm compress to the inside corner of your eye. To do this: ? Wash your hands first. ? Hold the compress over the inside corner of your eye for a few minutes. ? Repeat this every few hours during the  day.  Keep all follow-up visits as told by your health care provider. This is important. Contact a health care provider if:  You have a fever.  Your symptoms come back, do not improve, or get worse. Get help right away if you have:  Redness, swelling, and pain that spread to the tissues around your eye.  A sudden decrease in your vision. Summary  Dacryocystitis can be sudden (acute) or long-lasting (chronic).  The most common cause of this condition is a blocked nasolacrimal duct.  Acute dacryocystitis is treated with antibiotic medicines.  Chronic dacryocystitis usually needs to be treated with surgery.  Keep all follow-up visits as told by your health care provider. This is important. This information is not intended to replace advice given to you by your health care provider. Make sure you discuss any questions you have with your health care provider. Document Revised: 07/21/2018 Document Reviewed: 07/21/2018 Elsevier Patient Education  2021 [employer].

## 2021-01-30 NOTE — Progress Notes (Signed)
Needs medication refill Inhaler for COPD Office notes for urinary incont. Gas pains. Discuss blood work from dialysis.

## 2021-01-31 ENCOUNTER — Other Ambulatory Visit: Payer: Self-pay

## 2021-01-31 DIAGNOSIS — Z992 Dependence on renal dialysis: Secondary | ICD-10-CM | POA: Diagnosis not present

## 2021-01-31 DIAGNOSIS — Z452 Encounter for adjustment and management of vascular access device: Secondary | ICD-10-CM | POA: Diagnosis not present

## 2021-01-31 DIAGNOSIS — N186 End stage renal disease: Secondary | ICD-10-CM | POA: Diagnosis not present

## 2021-02-01 ENCOUNTER — Other Ambulatory Visit: Payer: Self-pay

## 2021-02-02 DIAGNOSIS — R32 Unspecified urinary incontinence: Secondary | ICD-10-CM | POA: Diagnosis not present

## 2021-02-06 ENCOUNTER — Ambulatory Visit: Payer: Medicaid Other | Attending: Family Medicine

## 2021-02-06 ENCOUNTER — Other Ambulatory Visit: Payer: Self-pay

## 2021-02-06 DIAGNOSIS — N186 End stage renal disease: Secondary | ICD-10-CM | POA: Diagnosis not present

## 2021-02-06 DIAGNOSIS — D649 Anemia, unspecified: Secondary | ICD-10-CM

## 2021-02-06 DIAGNOSIS — Z992 Dependence on renal dialysis: Secondary | ICD-10-CM | POA: Diagnosis not present

## 2021-02-06 DIAGNOSIS — E1122 Type 2 diabetes mellitus with diabetic chronic kidney disease: Secondary | ICD-10-CM | POA: Diagnosis not present

## 2021-02-07 ENCOUNTER — Other Ambulatory Visit: Payer: Self-pay | Admitting: Family Medicine

## 2021-02-07 DIAGNOSIS — D649 Anemia, unspecified: Secondary | ICD-10-CM

## 2021-02-07 LAB — CBC WITH DIFFERENTIAL/PLATELET
Basophils Absolute: 0 10*3/uL (ref 0.0–0.2)
Basos: 0 %
EOS (ABSOLUTE): 0.2 10*3/uL (ref 0.0–0.4)
Eos: 3 %
Hematocrit: 31.3 % — ABNORMAL LOW (ref 34.0–46.6)
Hemoglobin: 9.5 g/dL — ABNORMAL LOW (ref 11.1–15.9)
Immature Grans (Abs): 0.1 10*3/uL (ref 0.0–0.1)
Immature Granulocytes: 1 %
Lymphocytes Absolute: 1.6 10*3/uL (ref 0.7–3.1)
Lymphs: 19 %
MCH: 28.1 pg (ref 26.6–33.0)
MCHC: 30.4 g/dL — ABNORMAL LOW (ref 31.5–35.7)
MCV: 93 fL (ref 79–97)
Monocytes Absolute: 0.7 10*3/uL (ref 0.1–0.9)
Monocytes: 9 %
Neutrophils Absolute: 5.8 10*3/uL (ref 1.4–7.0)
Neutrophils: 68 %
Platelets: 299 10*3/uL (ref 150–450)
RBC: 3.38 x10E6/uL — ABNORMAL LOW (ref 3.77–5.28)
RDW: 15.8 % — ABNORMAL HIGH (ref 11.7–15.4)
WBC: 8.4 10*3/uL (ref 3.4–10.8)

## 2021-02-08 ENCOUNTER — Encounter (HOSPITAL_COMMUNITY): Payer: Medicaid Other

## 2021-02-09 ENCOUNTER — Other Ambulatory Visit (HOSPITAL_COMMUNITY): Payer: Self-pay

## 2021-02-09 ENCOUNTER — Other Ambulatory Visit: Payer: Self-pay

## 2021-02-13 DIAGNOSIS — N2581 Secondary hyperparathyroidism of renal origin: Secondary | ICD-10-CM | POA: Diagnosis not present

## 2021-02-13 DIAGNOSIS — E8770 Fluid overload, unspecified: Secondary | ICD-10-CM | POA: Diagnosis not present

## 2021-02-13 DIAGNOSIS — N186 End stage renal disease: Secondary | ICD-10-CM | POA: Diagnosis not present

## 2021-02-13 DIAGNOSIS — Z992 Dependence on renal dialysis: Secondary | ICD-10-CM | POA: Diagnosis not present

## 2021-02-19 ENCOUNTER — Other Ambulatory Visit: Payer: Self-pay

## 2021-02-19 ENCOUNTER — Emergency Department (HOSPITAL_COMMUNITY)
Admission: EM | Admit: 2021-02-19 | Discharge: 2021-02-20 | Disposition: A | Discharge status: Home / Self Care | Payer: Medicare Other | Attending: Emergency Medicine | Admitting: Emergency Medicine

## 2021-02-19 ENCOUNTER — Emergency Department (HOSPITAL_COMMUNITY): Payer: Medicare Other

## 2021-02-19 DIAGNOSIS — S298XXA Other specified injuries of thorax, initial encounter: Secondary | ICD-10-CM

## 2021-02-19 DIAGNOSIS — Z79899 Other long term (current) drug therapy: Secondary | ICD-10-CM | POA: Insufficient documentation

## 2021-02-19 DIAGNOSIS — Z992 Dependence on renal dialysis: Secondary | ICD-10-CM | POA: Diagnosis not present

## 2021-02-19 DIAGNOSIS — S299XXA Unspecified injury of thorax, initial encounter: Secondary | ICD-10-CM | POA: Diagnosis not present

## 2021-02-19 DIAGNOSIS — Y9301 Activity, walking, marching and hiking: Secondary | ICD-10-CM | POA: Insufficient documentation

## 2021-02-19 DIAGNOSIS — S40812A Abrasion of left upper arm, initial encounter: Secondary | ICD-10-CM | POA: Diagnosis not present

## 2021-02-19 DIAGNOSIS — E1122 Type 2 diabetes mellitus with diabetic chronic kidney disease: Secondary | ICD-10-CM | POA: Diagnosis not present

## 2021-02-19 DIAGNOSIS — Y92019 Unspecified place in single-family (private) house as the place of occurrence of the external cause: Secondary | ICD-10-CM | POA: Diagnosis not present

## 2021-02-19 DIAGNOSIS — I132 Hypertensive heart and chronic kidney disease with heart failure and with stage 5 chronic kidney disease, or end stage renal disease: Secondary | ICD-10-CM | POA: Diagnosis not present

## 2021-02-19 DIAGNOSIS — W010XXA Fall on same level from slipping, tripping and stumbling without subsequent striking against object, initial encounter: Secondary | ICD-10-CM | POA: Insufficient documentation

## 2021-02-19 DIAGNOSIS — Z23 Encounter for immunization: Secondary | ICD-10-CM | POA: Diagnosis not present

## 2021-02-19 DIAGNOSIS — I509 Heart failure, unspecified: Secondary | ICD-10-CM | POA: Diagnosis not present

## 2021-02-19 DIAGNOSIS — S41112A Laceration without foreign body of left upper arm, initial encounter: Secondary | ICD-10-CM | POA: Diagnosis not present

## 2021-02-19 DIAGNOSIS — R0789 Other chest pain: Secondary | ICD-10-CM | POA: Diagnosis not present

## 2021-02-19 DIAGNOSIS — S4992XA Unspecified injury of left shoulder and upper arm, initial encounter: Secondary | ICD-10-CM | POA: Diagnosis present

## 2021-02-19 DIAGNOSIS — I2510 Atherosclerotic heart disease of native coronary artery without angina pectoris: Secondary | ICD-10-CM | POA: Diagnosis not present

## 2021-02-19 DIAGNOSIS — E1140 Type 2 diabetes mellitus with diabetic neuropathy, unspecified: Secondary | ICD-10-CM | POA: Diagnosis not present

## 2021-02-19 DIAGNOSIS — F17210 Nicotine dependence, cigarettes, uncomplicated: Secondary | ICD-10-CM | POA: Diagnosis not present

## 2021-02-19 DIAGNOSIS — J449 Chronic obstructive pulmonary disease, unspecified: Secondary | ICD-10-CM | POA: Insufficient documentation

## 2021-02-19 DIAGNOSIS — Z043 Encounter for examination and observation following other accident: Secondary | ICD-10-CM | POA: Diagnosis not present

## 2021-02-19 DIAGNOSIS — W19XXXA Unspecified fall, initial encounter: Secondary | ICD-10-CM

## 2021-02-19 DIAGNOSIS — J9811 Atelectasis: Secondary | ICD-10-CM | POA: Diagnosis not present

## 2021-02-19 DIAGNOSIS — N186 End stage renal disease: Secondary | ICD-10-CM | POA: Insufficient documentation

## 2021-02-19 DIAGNOSIS — I517 Cardiomegaly: Secondary | ICD-10-CM | POA: Diagnosis not present

## 2021-02-19 NOTE — ED Triage Notes (Signed)
Pt reports mechanical fall yesterday while walking up a ramp at her house and tripping over a nail. Pt MWF dialysis patient, went to dialysis this morning and she was sent here without her tx for eval of L rib cage, L shoulder, L knee pain and has scratches and cuts to L arm where her dialysis access is. No LOC, no dizziness prior to fall.

## 2021-02-19 NOTE — ED Notes (Signed)
Pt called x3 for vitals with no answer. Will check again in 15-20 minutes.

## 2021-02-20 ENCOUNTER — Emergency Department (HOSPITAL_COMMUNITY): Payer: Medicare Other

## 2021-02-20 DIAGNOSIS — I517 Cardiomegaly: Secondary | ICD-10-CM | POA: Diagnosis not present

## 2021-02-20 DIAGNOSIS — J9811 Atelectasis: Secondary | ICD-10-CM | POA: Diagnosis not present

## 2021-02-20 LAB — RENAL FUNCTION PANEL
Albumin: 2.7 g/dL — ABNORMAL LOW (ref 3.5–5.0)
Anion gap: 19 — ABNORMAL HIGH (ref 5–15)
BUN: 63 mg/dL — ABNORMAL HIGH (ref 8–23)
CO2: 22 mmol/L (ref 22–32)
Calcium: 7.9 mg/dL — ABNORMAL LOW (ref 8.9–10.3)
Chloride: 98 mmol/L (ref 98–111)
Creatinine, Ser: 8 mg/dL — ABNORMAL HIGH (ref 0.44–1.00)
GFR, Estimated: 5 mL/min — ABNORMAL LOW (ref >60)
Glucose, Bld: 125 mg/dL — ABNORMAL HIGH (ref 70–99)
Phosphorus: >30 mg/dL — ABNORMAL HIGH (ref 2.5–4.6)
Potassium: 2.8 mmol/L — ABNORMAL LOW (ref 3.5–5.1)
Sodium: 139 mmol/L (ref 135–145)

## 2021-02-20 LAB — I-STAT CHEM 8, ED
BUN: 65 mg/dL — ABNORMAL HIGH (ref 8–23)
Calcium, Ion: 0.9 mmol/L — ABNORMAL LOW (ref 1.15–1.40)
Chloride: 103 mmol/L (ref 98–111)
Creatinine, Ser: 8.7 mg/dL — ABNORMAL HIGH (ref 0.44–1.00)
Glucose, Bld: 130 mg/dL — ABNORMAL HIGH (ref 70–99)
HCT: 29 % — ABNORMAL LOW (ref 36.0–46.0)
Hemoglobin: 9.9 g/dL — ABNORMAL LOW (ref 12.0–15.0)
Potassium: 2.8 mmol/L — ABNORMAL LOW (ref 3.5–5.1)
Sodium: 140 mmol/L (ref 135–145)
TCO2: 24 mmol/L (ref 22–32)

## 2021-02-20 LAB — CBC
HCT: 27.8 % — ABNORMAL LOW (ref 36.0–46.0)
Hemoglobin: 8.3 g/dL — ABNORMAL LOW (ref 12.0–15.0)
MCH: 28.1 pg (ref 26.0–34.0)
MCHC: 29.9 g/dL — ABNORMAL LOW (ref 30.0–36.0)
MCV: 94.2 fL (ref 80.0–100.0)
Platelets: 303 10*3/uL (ref 150–400)
RBC: 2.95 MIL/uL — ABNORMAL LOW (ref 3.87–5.11)
RDW: 17.4 % — ABNORMAL HIGH (ref 11.5–15.5)
WBC: 9.9 10*3/uL (ref 4.0–10.5)
nRBC: 0 % (ref 0.0–0.2)

## 2021-02-20 MED ORDER — ALTEPLASE 2 MG IJ SOLR: 2.0000 mg | Freq: Once | INTRAMUSCULAR | Status: DC | PRN

## 2021-02-20 MED ORDER — TETANUS-DIPHTH-ACELL PERTUSSIS 5-2.5-18.5 LF-MCG/0.5 IM SUSY
0.5000 mL | PREFILLED_SYRINGE | Freq: Once | INTRAMUSCULAR | Status: AC
  Administered 2021-02-20: 0.5 mL via INTRAMUSCULAR
  Filled 2021-02-20: qty 0.5

## 2021-02-20 MED ORDER — SODIUM CHLORIDE 0.9 % IV SOLN: 100.0000 mL | INTRAVENOUS | Status: DC | PRN

## 2021-02-20 MED ORDER — PENTAFLUOROPROP-TETRAFLUOROETH EX AERO
1.0000 "application " | INHALATION_SPRAY | CUTANEOUS | Status: DC | PRN
  Filled 2021-02-20: qty 116

## 2021-02-20 MED ORDER — CHLORHEXIDINE GLUCONATE CLOTH 2 % EX PADS: 6.0000 | MEDICATED_PAD | Freq: Every day | CUTANEOUS | Status: DC

## 2021-02-20 MED ORDER — LIDOCAINE HCL (PF) 1 % IJ SOLN: 5.0000 mL | INTRAMUSCULAR | Status: DC | PRN

## 2021-02-20 MED ORDER — LIDOCAINE-PRILOCAINE 2.5-2.5 % EX CREA
1.0000 "application " | TOPICAL_CREAM | CUTANEOUS | Status: DC | PRN
  Filled 2021-02-20: qty 5

## 2021-02-20 MED ORDER — HEPARIN SODIUM (PORCINE) 1000 UNIT/ML DIALYSIS
1000.0000 [IU] | INTRAMUSCULAR | Status: DC | PRN
  Filled 2021-02-20: qty 1

## 2021-02-20 NOTE — Progress Notes (Addendum)
Brief Nephrology note: Pt p/w fall and missed dialysis on Monday. Some SOB+. Imaging studies without fracture. K 2.8, bun 65. ER called [place] to arrange dialysis. I called [employer], unfortunately no chair time/spot for today manly because of short staffing, We will plan HD today in the hospital and the patient will return to ER for re-evaluation after dialysis. D/w Dr. Christy Gentles and HD nurse. She will likely discharge home afterward.   OP HD orders: [facility], 3.45 hr, 3K 2.5 ca, AVF for the access, 400/500. EDW 76.5 Kg.  Tina Gomez. Carolin Sicks, MD [facility].

## 2021-02-20 NOTE — ED Provider Notes (Signed)
[facility] EMERGENCY DEPARTMENT Provider Note   CSN: [id number] Arrival date & time: 02/19/21  1150     History Chief Complaint  Patient presents with   [name]    Tina Gomez is a 65 y.o. female.  The history is provided by the patient.  Fall This is a new problem. The problem occurs constantly. The problem has been gradually worsening. Associated symptoms include chest pain. Pertinent negatives include no abdominal pain and no headaches. The symptoms are aggravated by walking. The symptoms are relieved by rest.  Patient with history of COPD on oxygen at night, diabetes, hypertension, ESRD presents after a fall.  Patient reports she tripped over a nail going up a ramp injuring her left arm and her chest.  No head injury or LOC.  She reports pain in her left chest wall and mild pain in the left arm.  She reports abrasions left arm.  She then went to her scheduled dialysis, and was told that she could not be dialyzed.  She reports they had a concern she had "internal bleeding" and was sent to the ER for further evaluation.   She reports she is now concerned that she may need dialysis emergently as she is feeling more short of breath. Past Medical History:  Diagnosis Date   Anemia    Chronic kidney disease    COPD (chronic obstructive pulmonary disease) ([place]) 05/2019   no inhaler   Diabetes mellitus without complication ([place])    no meds - diet controlled   History of blood transfusion 09/2020   1 unit   HLD (hyperlipidemia)    Hypertension    PONV (postoperative nausea and vomiting)    Restless legs syndrome (RLS)    Stroke [facility])     Patient Active Problem List   Diagnosis Date Noted   Acute pulmonary edema (HCC)    Volume overload 12/13/2020   CAD (coronary artery disease) 12/13/2020   Acute respiratory failure with hypoxia ([place]) 12/13/2020   Shortness of breath    CHF (congestive heart failure) ([place]) 11/07/2020   Protein-calorie malnutrition, severe  07/28/2020   ARF (acute renal failure) ([place]) 07/23/2020   Diabetic gastroparesis (HCC)    Abnormal finding on GI tract imaging    H. pylori infection    Diverticulitis 05/24/2020   History of stroke 05/24/2020   Anemia 05/24/2020   Nausea and vomiting 05/24/2020   AKI (acute kidney injury) ([place]) 05/24/2020   Dehydration 05/24/2020   Hypomagnesemia 05/24/2020   Hypokalemia 05/24/2020   Tobacco abuse 05/24/2020   Emphysema of lung ([place]) 05/24/2020   Psoriasis 02/14/2016   Type 2 diabetes mellitus with diabetic neuropathy, unspecified ([place]) 01/18/2014   Essential hypertension, benign 01/18/2014   GERD (gastroesophageal reflux disease) 01/18/2014   Hyperlipidemia 01/18/2014   History of CVA (cerebrovascular accident) 10/22/2013   Malignant hypertension 10/22/2013    Past Surgical History:  Procedure Laterality Date   AV FISTULA PLACEMENT Left 07/27/2020   Procedure: LEFT BRACHIO-BASILIC ARTERIOVENOUS (AV) FISTULA CREATION;  Surgeon: Cherre Robins, MD;  Location: [place];  Service: Vascular;  Laterality: Left;   [place] Left 11/02/2020   Procedure: LEFT UPPER ARM SECOND STAGE BASILIC VEIN TRANSPOSITION;  Surgeon: Cherre Robins, MD;  Location: [place] [facility];  Service: Vascular;  Laterality: Left;  PERIPHERAL NERVE BLOCK   CESAREAN SECTION     x 1   ESOPHAGOGASTRODUODENOSCOPY (EGD) WITH PROPOFOL N/A 05/26/2020   Procedure: ESOPHAGOGASTRODUODENOSCOPY (EGD) WITH PROPOFOL;  Surgeon: Irene Shipper,  MD;  Location: [place] [facility];  Service: Endoscopy;  Laterality: N/A;   INSERTION OF DIALYSIS CATHETER Right 07/27/2020   Procedure: INSERTION OF RIGHT INTERNAL JUGULAR TUNNELED DIALYSIS CATHETER;  Surgeon: Cherre Robins, MD;  Location: [place];  Service: Vascular;  Laterality: Right;   IR FLUORO GUIDE CV LINE RIGHT  07/25/2020   IR US GUIDE VASC ACCESS RIGHT  07/25/2020   LEFT HEART CATH AND CORONARY ANGIOGRAPHY N/A 11/28/2020   Procedure: LEFT HEART CATH AND CORONARY ANGIOGRAPHY;   Surgeon: Troy Sine, MD;  Location: [place] CV LAB;  Service: Cardiovascular;  Laterality: N/A;   TONSILLECTOMY     UPPER GI ENDOSCOPY     growth removed from voice box   WRIST SURGERY Left    ganglion cyst removal     OB History   No obstetric history on file.     Family History  Problem Relation Age of Onset   Stroke Mother    Hypertension Mother    Hyperlipidemia Mother    Hypertension Father    Hyperlipidemia Father     Social History   Tobacco Use   Smoking status: Every Day    Packs/day: 0.25    Years: 10.00    Pack years: 2.50    Types: Cigarettes   Smokeless tobacco: Never   Tobacco comments:    5 cigs daily  Vaping Use   Vaping Use: Never used  Substance Use Topics   Alcohol use: No   Drug use: No    Home Medications Prior to Admission medications   Medication Sig Start Date End Date Taking? Authorizing Provider  albuterol (PROVENTIL) (2.5 MG/3ML) 0.083% nebulizer solution Take 3 mLs (2.5 mg total) by nebulization every 6 (six) hours as needed for wheezing or shortness of breath. 12/12/20   Charlott Rakes, MD  albuterol (VENTOLIN HFA) 108 (90 Base) MCG/ACT inhaler Inhale 2 puffs into the lungs every 6 (six) hours as needed for wheezing or shortness of breath. 12/12/20   Charlott Rakes, MD  atorvastatin (LIPITOR) 40 MG tablet TAKE 1 TABLET BY MOUTH DAILY AT 6 PM. Patient taking differently: Take 40 mg by mouth daily at 6 PM. 05/23/20   Charlott Rakes, MD  B Complex-C-Zn-Folic Acid (DIALYVITE 275 WITH ZINC) 0.8 MG TABS Take 1 tablet by mouth at bedtime. 08/12/20   [provider]  bacitracin-polymyxin b (POLYSPORIN) ophthalmic ointment Place 1 application into the right eye every 12 (twelve) hours. apply to eye every 12 hours while awake 01/30/21   Charlott Rakes, MD  Blood Glucose Monitoring Suppl (BLOOD GLUCOSE METER) kit Use as instructed 11/01/13   Reyne Dumas, MD  Blood Glucose Monitoring Suppl (TRUE METRIX METER) DEVI 1 each by Does not  apply route 3 (three) times daily before meals. 06/02/20   Charlott Rakes, MD  calcium acetate (PHOSLO) 667 MG capsule Take 1 capsule (667 mg total) by mouth with snacks. 01/24/21 02/23/21  Antonieta Pert, MD  carvedilol (COREG) 12.5 MG tablet TAKE 1 TABLET (12.5 MG TOTAL) BY MOUTH 2 (TWO) TIMES DAILY WITH A MEAL. Patient taking differently: Take 12.5 mg by mouth 2 (two) times daily with a meal. 06/15/20 06/15/21  Charlott Rakes, MD  FLUoxetine (PROZAC) 20 MG capsule Take 1 capsule (20 mg total) by mouth daily. 12/12/20   Charlott Rakes, MD  gabapentin (NEURONTIN) 100 MG capsule Take 1 capsule by mouth three times a day 01/10/21     glucose blood (TRUETEST TEST) test strip Use as instructed 06/02/20   Charlott Rakes,  MD  hydrOXYzine (ATARAX/VISTARIL) 25 MG tablet Take 1 tablet (25 mg total) by mouth 3 (three) times daily as needed. Patient taking differently: Take 25 mg by mouth 3 (three) times daily as needed for anxiety. 12/12/20   Charlott Rakes, MD  Lancets (FREESTYLE) lancets Use as instructed 11/01/13   Reyne Dumas, MD  lidocaine-prilocaine (EMLA) cream Apply 1 application topically See admin instructions. APPLY SMALL AMOUNT TO ACCESS SITE (AVF) 1 TO 2 HOURS BEFORE DIALYSIS. COVER WITH OCCLUSIVE DRESSING (SARAN WRAP). 12/04/20   [provider]  metoCLOPramide (REGLAN) 5 MG tablet TAKE 1 TABLET BY MOUTH FOUR TIMES DAILY, BEFORE MEALS AND AT BEDTIME. Patient taking differently: Take 5 mg by mouth daily as needed for nausea or vomiting. 11/13/20 11/13/21  Willia Craze, NP  midodrine (PROAMATINE) 10 MG tablet Take 1 tablet (10 mg total) by mouth every Monday, Wednesday, and Friday with hemodialysis. 01/31/21   Charlott Rakes, MD  Misc. Devices MISC Large depends. Diagnosis - fecal incontinence 01/30/21   Charlott Rakes, MD  Tiotropium Bromide Monohydrate (SPIRIVA RESPIMAT) 2.5 MCG/ACT AERS Inhale 2 puffs into the lungs daily. 01/30/21   Charlott Rakes, MD  TRUEplus Lancets 28G MISC 1 each by Does  not apply route 3 (three) times daily before meals. 06/02/20   Charlott Rakes, MD  amLODipine (NORVASC) 10 MG tablet Take 1 tablet (10 mg total) by mouth daily. 08/01/20 11/09/20  Terrilee Croak, MD  hydrALAZINE (APRESOLINE) 25 MG tablet Take 1 tablet (25 mg total) by mouth every 8 (eight) hours. 07/31/20 11/09/20  Terrilee Croak, MD  omeprazole (PRILOSEC) 20 MG capsule TAKE 1 CAPSULE (20 MG TOTAL) BY MOUTH 2 (TWO) TIMES DAILY BEFORE A MEAL. 08/01/20 08/22/20  Charlott Rakes, MD    Allergies    Codeine and Adhesive [tape]  Review of Systems   Review of Systems  Constitutional:  Negative for fever.  Cardiovascular:  Positive for chest pain.  Gastrointestinal:  Negative for abdominal pain.  Skin:  Positive for wound.  Neurological:  Negative for headaches.  All other systems reviewed and are negative.  Physical Exam Updated Vital Signs BP (!) 181/60   Pulse 73   Temp 98 F (36.7 C) (Oral)   Resp 17   LMP  (LMP Unknown)   SpO2 96%   Physical Exam CONSTITUTIONAL: Chronically ill-appearing HEAD: Normocephalic/atraumatic EYES: EOMI/PERRL ENMT: Mucous membranes moist NECK: supple no meningeal signs SPINE/BACK:entire spine nontender, no bruising/crepitance/stepoffs noted to spine CV: S1/S2 noted LUNGS: Lungs are clear to auscultation bilaterally, no apparent distress Chest-diffuse left-sided chest wall tenderness without crepitus or bruising.  Chaperone present for exam ABDOMEN: soft, nontender, no rebound or guarding, bowel sounds noted throughout abdomen, no LUQ tenderness.  No bruising GU:no cva tenderness NEURO: Pt is awake/alert/appropriate, moves all extremitiesx4.  No facial droop.   EXTREMITIES: pulses normal/equal, full ROM, scattered abrasions and skin tears to left arm.  Distal pulses equal and intact.  No significant tenderness of the left elbow or left wrist. All other extremities/joints palpated/ranged and nontender SKIN: warm, color normal, abrasions to left arm PSYCH:  no abnormalities of mood noted, alert and oriented to situation  ED Results / Procedures / Treatments   Labs (all labs ordered are listed, but only abnormal results are displayed) Labs Reviewed  I-STAT CHEM 8, ED - Abnormal; Notable for the following components:      Result Value   Potassium 2.8 (*)    BUN 65 (*)    Creatinine, Ser 8.70 (*)    Glucose, Bld  130 (*)    Calcium, Ion 0.90 (*)    Hemoglobin 9.9 (*)    HCT 29.0 (*)    All other components within normal limits  RENAL FUNCTION PANEL  CBC    EKG EKG Interpretation  Date/Time:  Tuesday February 20 2021 04:46:37 EDT Ventricular Rate:  74 PR Interval:  289 QRS Duration: 100 QT Interval:  434 QTC Calculation: 482 R Axis:   14 Text Interpretation: Sinus rhythm Prolonged PR interval Borderline repolarization abnormality Confirmed by Ripley Fraise [phone]) on 02/20/2021 4:50:56 AM  Radiology DG Ribs Unilateral W/Chest Left  Result Date: 02/19/2021 CLINICAL DATA:  Fall yesterday. EXAM: LEFT RIBS AND CHEST - 3+ VIEW COMPARISON:  01/03/2021 FINDINGS: Cardiac enlargement. Mild vascular congestion without edema. Mild bibasilar atelectasis. No significant effusion or pneumothorax Negative for left rib fracture. IMPRESSION: Negative for left rib fracture Bibasilar atelectasis. Electronically Signed   By: Franchot Gallo M.D.   On: 02/19/2021 12:33   DG Knee 2 Views Left  Result Date: 02/19/2021 CLINICAL DATA:  Fall. EXAM: LEFT KNEE - 1-2 VIEW COMPARISON:  None. FINDINGS: Negative for fracture or joint effusion. Normal alignment. Joint spaces well maintained Moderate atherosclerotic calcification. IMPRESSION: Negative for fracture. Electronically Signed   By: Franchot Gallo M.D.   On: 02/19/2021 12:36   DG Shoulder Left  Result Date: 02/19/2021 CLINICAL DATA:  Fall yesterday EXAM: LEFT SHOULDER - 2+ VIEW COMPARISON:  None. FINDINGS: There is no evidence of fracture or dislocation. There is no evidence of arthropathy or other focal  bone abnormality. Mild arterial calcification in the left axillary artery. IMPRESSION: Negative. Electronically Signed   By: Franchot Gallo M.D.   On: 02/19/2021 12:35    Procedures Procedures   Medications Ordered in ED Medications  Tdap (BOOSTRIX) injection 0.5 mL (0.5 mLs Intramuscular Given 02/20/21 0506)    ED Course  I have reviewed the triage vital signs and the nursing notes.  Pertinent labs & imaging results that were available during my care of the patient were reviewed by me and considered in my medical decision making (see chart for details).    MDM Rules/Calculators/A&P                          5:12 AM Patient presented after a fall right before dialysis.  Due to the fall, she was not dialyzed and sent to the emergency department.  Due to prolonged wait time, patient now feels that she may need emergency dialysis.  I have repeated her chest x-ray and will check potassium No other signs of acute traumatic injury  7:01 AM D/w Dr Carolin Sicks with nephrology He will arrange dialysis here today as she is unable to be seen by her dialysis center today Will endorse to  Dr. Rex Kras at signout Final Clinical Impression(s) / ED Diagnoses Final diagnoses:  Blunt trauma to chest, initial encounter  Abrasion of left upper extremity, initial encounter    Rx / DC Orders ED Discharge Orders     None        Ripley Fraise, MD 02/20/21 [id number]

## 2021-02-20 NOTE — ED Provider Notes (Signed)
11:31 AM Patient feels much better after dialysis.  She feels well and is stable for discharge home.   Sherwood Gambler, MD 02/20/21 1131

## 2021-02-20 NOTE — Procedures (Signed)
Pt seen on HD, no SOB , wears chronic O2 at home, 3 L goal .  For dc back to ED when HD completed.   I was present at this dialysis session, have reviewed the session itself and made  appropriate changes Kelly Splinter MD [place] pager [phone]   02/20/2021, 7:44 AM

## 2021-02-21 DIAGNOSIS — I517 Cardiomegaly: Secondary | ICD-10-CM | POA: Diagnosis not present

## 2021-02-21 DIAGNOSIS — J90 Pleural effusion, not elsewhere classified: Secondary | ICD-10-CM | POA: Diagnosis not present

## 2021-02-21 DIAGNOSIS — I951 Orthostatic hypotension: Secondary | ICD-10-CM | POA: Diagnosis not present

## 2021-02-21 DIAGNOSIS — R000 Tachycardia, unspecified: Secondary | ICD-10-CM | POA: Diagnosis not present

## 2021-02-21 DIAGNOSIS — R55 Syncope and collapse: Secondary | ICD-10-CM | POA: Diagnosis not present

## 2021-02-21 DIAGNOSIS — I959 Hypotension, unspecified: Secondary | ICD-10-CM | POA: Diagnosis not present

## 2021-02-21 DIAGNOSIS — R404 Transient alteration of awareness: Secondary | ICD-10-CM | POA: Diagnosis not present

## 2021-02-21 DIAGNOSIS — I120 Hypertensive chronic kidney disease with stage 5 chronic kidney disease or end stage renal disease: Secondary | ICD-10-CM | POA: Diagnosis not present

## 2021-02-21 DIAGNOSIS — R0602 Shortness of breath: Secondary | ICD-10-CM | POA: Diagnosis not present

## 2021-02-21 DIAGNOSIS — R079 Chest pain, unspecified: Secondary | ICD-10-CM | POA: Diagnosis not present

## 2021-02-21 DIAGNOSIS — R0902 Hypoxemia: Secondary | ICD-10-CM | POA: Diagnosis not present

## 2021-02-21 DIAGNOSIS — R5383 Other fatigue: Secondary | ICD-10-CM | POA: Diagnosis not present

## 2021-02-21 DIAGNOSIS — N186 End stage renal disease: Secondary | ICD-10-CM | POA: Diagnosis not present

## 2021-02-22 DIAGNOSIS — R32 Unspecified urinary incontinence: Secondary | ICD-10-CM | POA: Diagnosis not present

## 2021-02-23 ENCOUNTER — Telehealth: Payer: Self-pay

## 2021-02-23 NOTE — Telephone Encounter (Signed)
Error

## 2021-02-27 ENCOUNTER — Other Ambulatory Visit: Payer: Self-pay

## 2021-02-27 ENCOUNTER — Ambulatory Visit (INDEPENDENT_AMBULATORY_CARE_PROVIDER_SITE_OTHER): Payer: Medicaid Other | Admitting: Nurse Practitioner

## 2021-02-27 VITALS — BP 180/63 | HR 72 | Temp 97.9°F | Resp 18

## 2021-02-27 DIAGNOSIS — N189 Chronic kidney disease, unspecified: Secondary | ICD-10-CM | POA: Diagnosis not present

## 2021-02-27 DIAGNOSIS — I998 Other disorder of circulatory system: Secondary | ICD-10-CM | POA: Diagnosis not present

## 2021-02-27 MED ORDER — CEPHALEXIN 500 MG PO CAPS
500.0000 mg | ORAL_CAPSULE | Freq: Two times a day (BID) | ORAL | 0 refills | Status: AC
  Filled 2021-02-27: qty 14, 7d supply, fill #0

## 2021-02-27 NOTE — Progress Notes (Signed)
@Patient  ID: Tina Gomez, female    DOB: 15-Feb-1956, 65 y.o.   MRN: [id number]  Chief Complaint  Patient presents with   Hospitalization Follow-up    Referring provider: Charlott Rakes, MD    HPI  Patient presents today for hospital follow-up/transition of care visit.  Patient was seen in the ED on 02/19/2021 and returned on 02/21/2021.  Patient has been having ongoing issues with fluctuation in blood pressure.  Patient is a dialysis patient.  She states that her blood pressure is extremely low on dialysis days and elevated on the days that she is not in dialysis.  She does have an appointment coming up this week with her doctors at the dialysis center and will discuss better options for dialysis and her blood pressure.  She does have a follow-up scheduled with cardiology and also has a follow-up with Dr. Margarita Rana (PCP) scheduled. Patient does have good family support. She does have all medications and is compliant.  Overall feeling improved today.  It is noted that patient's blood pressure is elevated today because it is a nondialysis day.  Patient does complain of insomnia.  We discussed that she can try melatonin and magnesium glycinate if this is okay with nephrology.  She also reports that she has a skin tear to her left hand and she is concerned that it may become infected.  Denies f/c/s, n/v/d, hemoptysis, PND, chest pain or edema.     Allergies  Allergen Reactions   Codeine Nausea And Vomiting   Adhesive [Tape] Other (See Comments)    Tape breaks out the skin if it is left on for a lengthy period of time    Immunization History  Administered Date(s) Administered   Influenza,inj,Quad PF,6+ Mos 08/22/2014, 11/13/2016, 12/03/2017, 07/29/2020   PFIZER(Purple Top)SARS-COV-2 Vaccination 12/23/2019, 01/13/2020   Pneumococcal Polysaccharide-23 10/23/2013, 07/29/2020   Tdap 02/14/2016, 02/20/2021    Past Medical History:  Diagnosis Date   Anemia    Chronic kidney disease    COPD  (chronic obstructive pulmonary disease) ([place]) 05/2019   no inhaler   Diabetes mellitus without complication (HCC)    no meds - diet controlled   History of blood transfusion 09/2020   1 unit   HLD (hyperlipidemia)    Hypertension    PONV (postoperative nausea and vomiting)    Restless legs syndrome (RLS)    Stroke (HCC)     Tobacco History: Social History   Tobacco Use  Smoking Status Every Day   Packs/day: 0.25   Years: 10.00   Pack years: 2.50   Types: Cigarettes  Smokeless Tobacco Never  Tobacco Comments   5 cigs daily   Ready to quit: Not Answered Counseling given: Not Answered Tobacco comments: 5 cigs daily   Outpatient Encounter Medications as of 02/27/2021  Medication Sig   [EXPIRED] cephALEXin (KEFLEX) 500 MG capsule Take 1 capsule (500 mg total) by mouth 2 (two) times daily for 7 days.   albuterol (PROVENTIL) (2.5 MG/3ML) 0.083% nebulizer solution Take 3 mLs (2.5 mg total) by nebulization every 6 (six) hours as needed for wheezing or shortness of breath.   albuterol (VENTOLIN HFA) 108 (90 Base) MCG/ACT inhaler Inhale 2 puffs into the lungs every 6 (six) hours as needed for wheezing or shortness of breath.   atorvastatin (LIPITOR) 40 MG tablet TAKE 1 TABLET BY MOUTH DAILY AT 6 PM. (Patient taking differently: Take 40 mg by mouth daily at 6 PM.)   B Complex-C-Zn-Folic Acid (DIALYVITE 262 WITH ZINC) 0.8 MG TABS  Take 1 tablet by mouth at bedtime.   bacitracin-polymyxin b (POLYSPORIN) ophthalmic ointment Place 1 application into the right eye every 12 (twelve) hours. apply to eye every 12 hours while awake   Blood Glucose Monitoring Suppl (BLOOD GLUCOSE METER) kit Use as instructed   Blood Glucose Monitoring Suppl (TRUE METRIX METER) DEVI 1 each by Does not apply route 3 (three) times daily before meals.   carvedilol (COREG) 12.5 MG tablet TAKE 1 TABLET (12.5 MG TOTAL) BY MOUTH 2 (TWO) TIMES DAILY WITH A MEAL. (Patient taking differently: Take 12.5 mg by mouth 2 (two)  times daily with a meal.)   FLUoxetine (PROZAC) 20 MG capsule Take 1 capsule (20 mg total) by mouth daily.   gabapentin (NEURONTIN) 100 MG capsule Take 1 capsule by mouth three times a day (Patient taking differently: Take 100 mg by mouth 2 (two) times daily.)   glucose blood (TRUETEST TEST) test strip Use as instructed   hydrOXYzine (ATARAX/VISTARIL) 25 MG tablet Take 1 tablet (25 mg total) by mouth 3 (three) times daily as needed. (Patient taking differently: Take 25 mg by mouth 3 (three) times daily as needed for anxiety.)   Lancets (FREESTYLE) lancets Use as instructed   lidocaine-prilocaine (EMLA) cream Apply 1 application topically See admin instructions. APPLY SMALL AMOUNT TO ACCESS SITE (AVF) 1 TO 2 HOURS BEFORE DIALYSIS. COVER WITH OCCLUSIVE DRESSING (SARAN WRAP).   metoCLOPramide (REGLAN) 5 MG tablet TAKE 1 TABLET BY MOUTH FOUR TIMES DAILY, BEFORE MEALS AND AT BEDTIME. (Patient taking differently: Take 5 mg by mouth daily as needed for nausea or vomiting.)   midodrine (PROAMATINE) 10 MG tablet Take 1 tablet (10 mg total) by mouth every Monday, Wednesday, and Friday with hemodialysis. (Patient taking differently: Take 10 mg by mouth See admin instructions. Monday ,Wednesday and Friday. As needed if blood pressure is low.)   Misc. Devices MISC Large depends. Diagnosis - fecal incontinence   Tiotropium Bromide Monohydrate (SPIRIVA RESPIMAT) 2.5 MCG/ACT AERS Inhale 2 puffs into the lungs daily.   TRUEplus Lancets 28G MISC 1 each by Does not apply route 3 (three) times daily before meals.   [DISCONTINUED] amLODipine (NORVASC) 10 MG tablet Take 1 tablet (10 mg total) by mouth daily.   [DISCONTINUED] hydrALAZINE (APRESOLINE) 25 MG tablet Take 1 tablet (25 mg total) by mouth every 8 (eight) hours.   [DISCONTINUED] omeprazole (PRILOSEC) 20 MG capsule TAKE 1 CAPSULE (20 MG TOTAL) BY MOUTH 2 (TWO) TIMES DAILY BEFORE A MEAL.   Facility-Administered Encounter Medications as of 02/27/2021  Medication    sodium chloride flush (NS) 0.9 % injection 3 mL     Review of Systems  Review of Systems  Constitutional: Negative.   HENT: Negative.    Respiratory:  Negative for cough and shortness of breath.   Cardiovascular: Negative.   Gastrointestinal: Negative.   Allergic/Immunologic: Negative.   Neurological: Negative.   Psychiatric/Behavioral: Negative.        Physical Exam  BP (!) 180/63   Pulse 72   Temp 97.9 F (36.6 C)   Resp 18   LMP  (LMP Unknown)   SpO2 93%   Wt Readings from Last 5 Encounters:  01/30/21 172 lb 6.4 oz (78.2 kg)  01/03/21 167 lb 8.8 oz (76 kg)  12/28/20 176 lb 6.4 oz (80 kg)  12/15/20 167 lb 5.3 oz (75.9 kg)  12/12/20 175 lb 3.2 oz (79.5 kg)     Physical Exam Vitals and nursing note reviewed.  Constitutional:  General: She is not in acute distress.    Appearance: She is well-developed.  Cardiovascular:     Rate and Rhythm: Normal rate and regular rhythm.  Pulmonary:     Effort: Pulmonary effort is normal.     Breath sounds: Normal breath sounds.  Skin:         Comments: Kin tear noted. Area is dry. No drainage noted. Slightly red around the edges.   Neurological:     Mental Status: She is alert and oriented to person, place, and time.     Lab Results:  CBC    Component Value Date/Time   WBC 9.9 02/20/2021 0633   RBC 2.95 (L) 02/20/2021 0633   HGB 8.3 (L) 02/20/2021 0633   HGB 9.5 (L) 02/06/2021 0927   HCT 27.8 (L) 02/20/2021 0633   HCT 31.3 (L) 02/06/2021 0927   PLT 303 02/20/2021 0633   PLT 299 02/06/2021 0927   MCV 94.2 02/20/2021 0633   MCV 93 02/06/2021 0927   MCH 28.1 02/20/2021 0633   MCHC 29.9 (L) 02/20/2021 0633   RDW 17.4 (H) 02/20/2021 0633   RDW 15.8 (H) 02/06/2021 0927   LYMPHSABS 1.6 02/06/2021 0927   MONOABS 0.8 01/03/2021 0424   EOSABS 0.2 02/06/2021 0927   BASOSABS 0.0 02/06/2021 0927    BMET    Component Value Date/Time   NA 139 02/20/2021 0633   NA 141 11/24/2020 1304   K 2.8 (L) 02/20/2021  0633   CL 98 02/20/2021 0633   CO2 22 02/20/2021 0633   GLUCOSE 125 (H) 02/20/2021 0633   BUN 63 (H) 02/20/2021 0633   BUN 47 (H) 11/24/2020 1304   CREATININE 8.00 (H) 02/20/2021 0633   CREATININE 1.25 (H) 11/13/2016 1018   CALCIUM 7.9 (L) 02/20/2021 0633   CALCIUM 5.8 (LL) 07/24/2020 0343   GFRNONAA 5 (L) 02/20/2021 0633   GFRNONAA 47 (L) 11/13/2016 1018   GFRAA 13 (L) 06/15/2020 1134   GFRAA 54 (L) 11/13/2016 1018    BNP    Component Value Date/Time   BNP 1,380.6 (H) 12/13/2020 0243    ProBNP No results found for: PROBNP  Imaging: DG Ribs Unilateral W/Chest Left  Result Date: 02/19/2021 CLINICAL DATA:  Fall yesterday. EXAM: LEFT RIBS AND CHEST - 3+ VIEW COMPARISON:  01/03/2021 FINDINGS: Cardiac enlargement. Mild vascular congestion without edema. Mild bibasilar atelectasis. No significant effusion or pneumothorax Negative for left rib fracture. IMPRESSION: Negative for left rib fracture Bibasilar atelectasis. Electronically Signed   By: Franchot Gallo M.D.   On: 02/19/2021 12:33   DG Knee 2 Views Left  Result Date: 02/19/2021 CLINICAL DATA:  Fall. EXAM: LEFT KNEE - 1-2 VIEW COMPARISON:  None. FINDINGS: Negative for fracture or joint effusion. Normal alignment. Joint spaces well maintained Moderate atherosclerotic calcification. IMPRESSION: Negative for fracture. Electronically Signed   By: Franchot Gallo M.D.   On: 02/19/2021 12:36   DG Chest Port 1 View  Result Date: 02/20/2021 CLINICAL DATA:  65 year old female status post fall yesterday. Left side pain. EXAM: PORTABLE CHEST 1 VIEW COMPARISON:  Chest and left rib series 02/19/2021 and earlier. FINDINGS: Portable AP semi upright view at 0501 hours. Stable lung volumes and mediastinal contours with chronic cardiomegaly. Decreased fluid or thickening along the right minor fissure since yesterday. Streaky left mid and lower lung opacity is stable and most resembles atelectasis. Stable pulmonary vascularity, no overt edema. No  pneumothorax. No definite effusion. Stable visualized osseous structures. Paucity of bowel gas in the upper abdomen. IMPRESSION:  Atelectasis not significantly changed since yesterday. Chronic cardiomegaly. No new cardiopulmonary abnormality. Electronically Signed   By: Genevie Ann M.D.   On: 02/20/2021 05:38   DG Shoulder Left  Result Date: 02/19/2021 CLINICAL DATA:  Fall yesterday EXAM: LEFT SHOULDER - 2+ VIEW COMPARISON:  None. FINDINGS: There is no evidence of fracture or dislocation. There is no evidence of arthropathy or other focal bone abnormality. Mild arterial calcification in the left axillary artery. IMPRESSION: Negative. Electronically Signed   By: Franchot Gallo M.D.   On: 02/19/2021 12:35     Assessment & Plan:   No problem-specific Assessment & Plan notes found for this encounter.     Fenton Foy, NP 03/08/2021

## 2021-02-27 NOTE — Patient Instructions (Addendum)
Hypotension:  Follow up with Cardiology to discuss hypotension ASAP  Follow up with [facility] - for blood pressure medication advice   Kidney disease:  Follow up with Nephrology this Wednesday to discuss issues   Insomnia:  Melatonin 1 mg 1 hour before bedtime  Magnesium Glycinate 400 mg 1 hour before bedtime  Please make sure it is ok with Nephrology before starting these supplements   Skin tear:  Left hand - will order Keflex  Keep clean and dry   Follow up:  Follow up with Dr. Margarita Rana as soon as possible

## 2021-02-28 ENCOUNTER — Other Ambulatory Visit: Payer: Self-pay

## 2021-03-08 DIAGNOSIS — N186 End stage renal disease: Secondary | ICD-10-CM | POA: Diagnosis not present

## 2021-03-08 DIAGNOSIS — N189 Chronic kidney disease, unspecified: Secondary | ICD-10-CM | POA: Insufficient documentation

## 2021-03-08 DIAGNOSIS — Z992 Dependence on renal dialysis: Secondary | ICD-10-CM | POA: Diagnosis not present

## 2021-03-08 DIAGNOSIS — E1122 Type 2 diabetes mellitus with diabetic chronic kidney disease: Secondary | ICD-10-CM | POA: Diagnosis not present

## 2021-03-08 DIAGNOSIS — I998 Other disorder of circulatory system: Secondary | ICD-10-CM | POA: Insufficient documentation

## 2021-03-08 NOTE — Assessment & Plan Note (Signed)
Hypotension:  Follow up with Cardiology to discuss hypotension ASAP  Follow up with [facility] - for blood pressure medication advice   Kidney disease:  Follow up with Nephrology this Wednesday to discuss issues   Insomnia:  Melatonin 1 mg 1 hour before bedtime  Magnesium Glycinate 400 mg 1 hour before bedtime  Please make sure it is ok with Nephrology before starting these supplements   Skin tear:  Left hand - will order Keflex  Keep clean and dry   Follow up:  Follow up with Dr. Margarita Rana as soon as possible

## 2021-03-21 DIAGNOSIS — R32 Unspecified urinary incontinence: Secondary | ICD-10-CM | POA: Diagnosis not present

## 2021-03-29 ENCOUNTER — Other Ambulatory Visit: Payer: Self-pay | Admitting: Family Medicine

## 2021-03-29 ENCOUNTER — Other Ambulatory Visit: Payer: Self-pay

## 2021-03-29 DIAGNOSIS — J439 Emphysema, unspecified: Secondary | ICD-10-CM

## 2021-03-29 MED FILL — Carvedilol Tab 12.5 MG: ORAL | 30 days supply | Qty: 60 | Fill #1 | Status: AC

## 2021-03-29 NOTE — Telephone Encounter (Signed)
  Last refill: 01/30/2021  Future visit scheduled: yes   Notes to clinic:  Failed protocol:One inhaler should last at least one month. If the patient is requesting refills earlier, contact the patient to check for uncontrolled symptoms.   Requested Prescriptions  Pending Prescriptions Disp Refills   albuterol (PROVENTIL) (2.5 MG/3ML) 0.083% nebulizer solution 75 mL 1    Sig: Take 3 mLs (2.5 mg total) by nebulization every 6 (six) hours as needed for wheezing or shortness of breath.      Pulmonology:  Beta Agonists Failed - 03/29/2021 11:14 AM      Failed - One inhaler should last at least one month. If the patient is requesting refills earlier, contact the patient to check for uncontrolled symptoms.      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

## 2021-03-30 ENCOUNTER — Other Ambulatory Visit: Payer: Self-pay

## 2021-03-30 MED ORDER — ALBUTEROL SULFATE (2.5 MG/3ML) 0.083% IN NEBU
2.5000 mg | INHALATION_SOLUTION | Freq: Four times a day (QID) | RESPIRATORY_TRACT | 0 refills | Status: DC | PRN
  Filled 2021-03-30: qty 75, 7d supply, fill #0

## 2021-04-02 ENCOUNTER — Other Ambulatory Visit: Payer: Self-pay

## 2021-04-04 ENCOUNTER — Inpatient Hospital Stay (HOSPITAL_COMMUNITY)
Admission: EM | Admit: 2021-04-04 | Discharge: 2021-04-15 | DRG: 177 | Disposition: A | Discharge status: Home / Self Care | Payer: Medicare Other | Attending: Internal Medicine | Admitting: Internal Medicine

## 2021-04-04 ENCOUNTER — Encounter (HOSPITAL_COMMUNITY): Payer: Self-pay | Admitting: *Deleted

## 2021-04-04 ENCOUNTER — Emergency Department (HOSPITAL_COMMUNITY): Payer: Medicare Other

## 2021-04-04 ENCOUNTER — Other Ambulatory Visit: Payer: Self-pay

## 2021-04-04 ENCOUNTER — Ambulatory Visit: Payer: Self-pay | Admitting: *Deleted

## 2021-04-04 DIAGNOSIS — Z7982 Long term (current) use of aspirin: Secondary | ICD-10-CM

## 2021-04-04 DIAGNOSIS — D631 Anemia in chronic kidney disease: Secondary | ICD-10-CM | POA: Diagnosis present

## 2021-04-04 DIAGNOSIS — Z83438 Family history of other disorder of lipoprotein metabolism and other lipidemia: Secondary | ICD-10-CM

## 2021-04-04 DIAGNOSIS — E1122 Type 2 diabetes mellitus with diabetic chronic kidney disease: Secondary | ICD-10-CM | POA: Diagnosis present

## 2021-04-04 DIAGNOSIS — Z91018 Allergy to other foods: Secondary | ICD-10-CM

## 2021-04-04 DIAGNOSIS — U071 COVID-19: Principal | ICD-10-CM | POA: Diagnosis present

## 2021-04-04 DIAGNOSIS — E1140 Type 2 diabetes mellitus with diabetic neuropathy, unspecified: Secondary | ICD-10-CM | POA: Diagnosis present

## 2021-04-04 DIAGNOSIS — I132 Hypertensive heart and chronic kidney disease with heart failure and with stage 5 chronic kidney disease, or end stage renal disease: Secondary | ICD-10-CM | POA: Diagnosis present

## 2021-04-04 DIAGNOSIS — F419 Anxiety disorder, unspecified: Secondary | ICD-10-CM

## 2021-04-04 DIAGNOSIS — E7800 Pure hypercholesterolemia, unspecified: Secondary | ICD-10-CM

## 2021-04-04 DIAGNOSIS — E785 Hyperlipidemia, unspecified: Secondary | ICD-10-CM | POA: Diagnosis present

## 2021-04-04 DIAGNOSIS — Y92239 Unspecified place in hospital as the place of occurrence of the external cause: Secondary | ICD-10-CM | POA: Diagnosis not present

## 2021-04-04 DIAGNOSIS — I10 Essential (primary) hypertension: Secondary | ICD-10-CM

## 2021-04-04 DIAGNOSIS — F17210 Nicotine dependence, cigarettes, uncomplicated: Secondary | ICD-10-CM | POA: Diagnosis present

## 2021-04-04 DIAGNOSIS — J069 Acute upper respiratory infection, unspecified: Secondary | ICD-10-CM | POA: Diagnosis present

## 2021-04-04 DIAGNOSIS — Z7189 Other specified counseling: Secondary | ICD-10-CM | POA: Diagnosis not present

## 2021-04-04 DIAGNOSIS — R0602 Shortness of breath: Secondary | ICD-10-CM | POA: Diagnosis present

## 2021-04-04 DIAGNOSIS — N2581 Secondary hyperparathyroidism of renal origin: Secondary | ICD-10-CM | POA: Diagnosis present

## 2021-04-04 DIAGNOSIS — Z8249 Family history of ischemic heart disease and other diseases of the circulatory system: Secondary | ICD-10-CM | POA: Diagnosis not present

## 2021-04-04 DIAGNOSIS — J159 Unspecified bacterial pneumonia: Secondary | ICD-10-CM | POA: Diagnosis present

## 2021-04-04 DIAGNOSIS — T380X5A Adverse effect of glucocorticoids and synthetic analogues, initial encounter: Secondary | ICD-10-CM | POA: Diagnosis not present

## 2021-04-04 DIAGNOSIS — J441 Chronic obstructive pulmonary disease with (acute) exacerbation: Secondary | ICD-10-CM

## 2021-04-04 DIAGNOSIS — J1282 Pneumonia due to coronavirus disease 2019: Secondary | ICD-10-CM | POA: Diagnosis present

## 2021-04-04 DIAGNOSIS — N186 End stage renal disease: Secondary | ICD-10-CM | POA: Diagnosis present

## 2021-04-04 DIAGNOSIS — J9621 Acute and chronic respiratory failure with hypoxia: Secondary | ICD-10-CM | POA: Diagnosis present

## 2021-04-04 DIAGNOSIS — I160 Hypertensive urgency: Secondary | ICD-10-CM | POA: Diagnosis not present

## 2021-04-04 DIAGNOSIS — G2581 Restless legs syndrome: Secondary | ICD-10-CM | POA: Diagnosis present

## 2021-04-04 DIAGNOSIS — J439 Emphysema, unspecified: Secondary | ICD-10-CM | POA: Diagnosis present

## 2021-04-04 DIAGNOSIS — Z8673 Personal history of transient ischemic attack (TIA), and cerebral infarction without residual deficits: Secondary | ICD-10-CM | POA: Diagnosis not present

## 2021-04-04 DIAGNOSIS — J810 Acute pulmonary edema: Secondary | ICD-10-CM | POA: Diagnosis present

## 2021-04-04 DIAGNOSIS — E1165 Type 2 diabetes mellitus with hyperglycemia: Secondary | ICD-10-CM | POA: Diagnosis not present

## 2021-04-04 DIAGNOSIS — Z9981 Dependence on supplemental oxygen: Secondary | ICD-10-CM

## 2021-04-04 DIAGNOSIS — Z992 Dependence on renal dialysis: Secondary | ICD-10-CM | POA: Diagnosis not present

## 2021-04-04 DIAGNOSIS — Z5329 Procedure and treatment not carried out because of patient's decision for other reasons: Secondary | ICD-10-CM | POA: Diagnosis present

## 2021-04-04 DIAGNOSIS — Z823 Family history of stroke: Secondary | ICD-10-CM | POA: Diagnosis not present

## 2021-04-04 DIAGNOSIS — Z79899 Other long term (current) drug therapy: Secondary | ICD-10-CM

## 2021-04-04 DIAGNOSIS — R531 Weakness: Secondary | ICD-10-CM

## 2021-04-04 DIAGNOSIS — R7989 Other specified abnormal findings of blood chemistry: Secondary | ICD-10-CM | POA: Diagnosis not present

## 2021-04-04 DIAGNOSIS — E213 Hyperparathyroidism, unspecified: Secondary | ICD-10-CM | POA: Diagnosis present

## 2021-04-04 DIAGNOSIS — J9610 Chronic respiratory failure, unspecified whether with hypoxia or hypercapnia: Secondary | ICD-10-CM | POA: Diagnosis present

## 2021-04-04 DIAGNOSIS — Z9115 Patient's noncompliance with renal dialysis: Secondary | ICD-10-CM

## 2021-04-04 DIAGNOSIS — Z515 Encounter for palliative care: Secondary | ICD-10-CM | POA: Diagnosis not present

## 2021-04-04 DIAGNOSIS — J9611 Chronic respiratory failure with hypoxia: Secondary | ICD-10-CM | POA: Diagnosis present

## 2021-04-04 DIAGNOSIS — E876 Hypokalemia: Secondary | ICD-10-CM | POA: Diagnosis present

## 2021-04-04 DIAGNOSIS — Z885 Allergy status to narcotic agent status: Secondary | ICD-10-CM

## 2021-04-04 LAB — COMPREHENSIVE METABOLIC PANEL
ALT: 10 U/L (ref 0–44)
AST: 13 U/L — ABNORMAL LOW (ref 15–41)
Albumin: 2.9 g/dL — ABNORMAL LOW (ref 3.5–5.0)
Alkaline Phosphatase: 68 U/L (ref 38–126)
Anion gap: 11 (ref 5–15)
BUN: 15 mg/dL (ref 8–23)
CO2: 28 mmol/L (ref 22–32)
Calcium: 8.9 mg/dL (ref 8.9–10.3)
Chloride: 98 mmol/L (ref 98–111)
Creatinine, Ser: 3.19 mg/dL — ABNORMAL HIGH (ref 0.44–1.00)
GFR, Estimated: 16 mL/min — ABNORMAL LOW (ref >60)
Glucose, Bld: 139 mg/dL — ABNORMAL HIGH (ref 70–99)
Potassium: 3.2 mmol/L — ABNORMAL LOW (ref 3.5–5.1)
Sodium: 137 mmol/L (ref 135–145)
Total Bilirubin: 0.2 mg/dL — ABNORMAL LOW (ref 0.3–1.2)
Total Protein: 6.2 g/dL — ABNORMAL LOW (ref 6.5–8.1)

## 2021-04-04 LAB — CBC WITH DIFFERENTIAL/PLATELET
Abs Immature Granulocytes: 0.1 10*3/uL — ABNORMAL HIGH (ref 0.00–0.07)
Basophils Absolute: 0 10*3/uL (ref 0.0–0.1)
Basophils Relative: 0 %
Eosinophils Absolute: 0.1 10*3/uL (ref 0.0–0.5)
Eosinophils Relative: 1 %
HCT: 34.1 % — ABNORMAL LOW (ref 36.0–46.0)
Hemoglobin: 9.9 g/dL — ABNORMAL LOW (ref 12.0–15.0)
Immature Granulocytes: 2 %
Lymphocytes Relative: 15 %
Lymphs Abs: 0.8 10*3/uL (ref 0.7–4.0)
MCH: 26.8 pg (ref 26.0–34.0)
MCHC: 29 g/dL — ABNORMAL LOW (ref 30.0–36.0)
MCV: 92.2 fL (ref 80.0–100.0)
Monocytes Absolute: 0.8 10*3/uL (ref 0.1–1.0)
Monocytes Relative: 16 %
Neutro Abs: 3.3 10*3/uL (ref 1.7–7.7)
Neutrophils Relative %: 66 %
Platelets: 211 10*3/uL (ref 150–400)
RBC: 3.7 MIL/uL — ABNORMAL LOW (ref 3.87–5.11)
RDW: 19 % — ABNORMAL HIGH (ref 11.5–15.5)
WBC: 5 10*3/uL (ref 4.0–10.5)
nRBC: 0 % (ref 0.0–0.2)

## 2021-04-04 LAB — RESP PANEL BY RT-PCR (FLU A&B, COVID) ARPGX2
Influenza A by PCR: NEGATIVE
Influenza B by PCR: NEGATIVE
SARS Coronavirus 2 by RT PCR: POSITIVE — AB

## 2021-04-04 MED ORDER — ACETAMINOPHEN 325 MG PO TABS
650.0000 mg | ORAL_TABLET | Freq: Four times a day (QID) | ORAL | Status: DC | PRN
  Administered 2021-04-05 – 2021-04-14 (×8): 650 mg via ORAL
  Filled 2021-04-04 (×8): qty 2

## 2021-04-04 MED ORDER — ADULT MULTIVITAMIN W/MINERALS CH
1.0000 | ORAL_TABLET | Freq: Every day | ORAL | Status: DC
  Administered 2021-04-05 – 2021-04-15 (×11): 1 via ORAL
  Filled 2021-04-04 (×11): qty 1

## 2021-04-04 MED ORDER — PREDNISONE 50 MG PO TABS
50.0000 mg | ORAL_TABLET | Freq: Every day | ORAL | Status: DC
  Administered 2021-04-08 – 2021-04-10 (×3): 50 mg via ORAL
  Filled 2021-04-04: qty 2
  Filled 2021-04-04: qty 1
  Filled 2021-04-04: qty 2

## 2021-04-04 MED ORDER — METHYLPREDNISOLONE SODIUM SUCC 125 MG IJ SOLR
80.0000 mg | Freq: Once | INTRAMUSCULAR | Status: AC
  Administered 2021-04-04: 80 mg via INTRAVENOUS
  Filled 2021-04-04: qty 2

## 2021-04-04 MED ORDER — HEPARIN SODIUM (PORCINE) 5000 UNIT/ML IJ SOLN
5000.0000 [IU] | Freq: Three times a day (TID) | INTRAMUSCULAR | Status: DC
  Administered 2021-04-04 – 2021-04-15 (×30): 5000 [IU] via SUBCUTANEOUS
  Filled 2021-04-04 (×30): qty 1

## 2021-04-04 MED ORDER — METHYLPREDNISOLONE SODIUM SUCC 125 MG IJ SOLR
80.0000 mg | Freq: Two times a day (BID) | INTRAMUSCULAR | Status: AC
Start: 2021-04-05 — End: 2021-04-07
  Administered 2021-04-05 – 2021-04-07 (×6): 80 mg via INTRAVENOUS
  Filled 2021-04-04 (×6): qty 2

## 2021-04-04 MED ORDER — GUAIFENESIN-DM 100-10 MG/5ML PO SYRP
10.0000 mL | ORAL_SOLUTION | ORAL | Status: DC | PRN
  Filled 2021-04-04 (×2): qty 10

## 2021-04-04 MED ORDER — IPRATROPIUM-ALBUTEROL 20-100 MCG/ACT IN AERS
1.0000 | INHALATION_SPRAY | Freq: Four times a day (QID) | RESPIRATORY_TRACT | Status: DC
  Administered 2021-04-05 – 2021-04-07 (×11): 1 via RESPIRATORY_TRACT
  Filled 2021-04-04: qty 4

## 2021-04-04 MED ORDER — ZINC SULFATE 220 (50 ZN) MG PO CAPS
220.0000 mg | ORAL_CAPSULE | Freq: Every day | ORAL | Status: DC
  Administered 2021-04-05 – 2021-04-15 (×11): 220 mg via ORAL
  Filled 2021-04-04 (×11): qty 1

## 2021-04-04 MED ORDER — ASCORBIC ACID 500 MG PO TABS
500.0000 mg | ORAL_TABLET | Freq: Every day | ORAL | Status: DC
  Administered 2021-04-05 – 2021-04-15 (×11): 500 mg via ORAL
  Filled 2021-04-04 (×11): qty 1

## 2021-04-04 NOTE — H&P (Addendum)
History and Physical  [name] IRC:[id number] DOB: 29-Aug-1956 DOA: 04/04/2021  Referring physician: Evette Cristal, PA-EDP. PCP: Charlott Rakes, MD  Outpatient Specialists: Nephrology. Patient coming from: Home.  Chief Complaint: Fever, cough, shortness of breath.  HPI: Tina Gomez is a 65 y.o. female with medical history significant for ESRD on HD MWF, COPD, chronic hypoxia on 4 L nasal cannula at baseline, hypertension, hyperlipidemia, anemia of chronic kidney disease, hyperlipidemia, chronic anxiety/depression, who presented to [facility] ED from home due to fever 102, cough, shortness of breath.  Onset on 04/03/2021, she took a COVID-19 screening test at home which returned negative.  She went to hemodialysis the following day with persistent symptoms, completed hemodialysis, returned home and took a COVID-19 test which returned positive.  EMS was called.  Upon EMS arrival oxygen saturation in the 80s on 4 L [place].  Oxygen supplementation increased to 6 L with O2 saturation in the low 90s.  She was brought into the ED for further evaluation.  Wheezing on exam, endorses feeling very weak and tired.  Chest x-ray revealing bilateral pulmonary opacities.  EDP contacted nephrology who recommended treatment of underlying conditions, multifocal pneumonia.  COVID-19 screening test positive on 04/04/2021.  Patient admitted to hospitalist team for acute on chronic hypoxic respiratory failure secondary to COVID-19 viral pneumonia likely superimposed by bacterial pneumonia.  ED Course:  T-max 99.9.  BP 194/84, pulse 75, respiration rate 15, O2 saturation 95% on 6 L.  Lab studies remarkable for serum sodium 137, potassium 3.2, BUN 15, creatinine 3.19.  WBC 5.0, hemoglobin 9.9, MCV 92, platelet 211.  Review of Systems: Review of systems as noted in the HPI. All other systems reviewed and are negative.   Past Medical History:  Diagnosis Date   Anemia    Chronic kidney disease    COPD (chronic obstructive pulmonary  disease) ([place]) 05/2019   no inhaler   Diabetes mellitus without complication ([place])    no meds - diet controlled   History of blood transfusion 09/2020   1 unit   HLD (hyperlipidemia)    Hypertension    PONV (postoperative nausea and vomiting)    Restless legs syndrome (RLS)    Stroke [facility])    Past Surgical History:  Procedure Laterality Date   AV FISTULA PLACEMENT Left 07/27/2020   Procedure: LEFT BRACHIO-BASILIC ARTERIOVENOUS (AV) FISTULA CREATION;  Surgeon: Cherre Robins, MD;  Location: [place];  Service: Vascular;  Laterality: Left;   [place] Left 11/02/2020   Procedure: LEFT UPPER ARM SECOND STAGE BASILIC VEIN TRANSPOSITION;  Surgeon: Cherre Robins, MD;  Location: [place];  Service: Vascular;  Laterality: Left;  PERIPHERAL NERVE BLOCK   CESAREAN SECTION     x 1   ESOPHAGOGASTRODUODENOSCOPY (EGD) WITH PROPOFOL N/A 05/26/2020   Procedure: ESOPHAGOGASTRODUODENOSCOPY (EGD) WITH PROPOFOL;  Surgeon: Irene Shipper, MD;  Location: [place];  Service: Endoscopy;  Laterality: N/A;   INSERTION OF DIALYSIS CATHETER Right 07/27/2020   Procedure: INSERTION OF RIGHT INTERNAL JUGULAR TUNNELED DIALYSIS CATHETER;  Surgeon: Cherre Robins, MD;  Location: [place];  Service: Vascular;  Laterality: Right;   IR FLUORO GUIDE CV LINE RIGHT  07/25/2020   IR US GUIDE VASC ACCESS RIGHT  07/25/2020   LEFT HEART CATH AND CORONARY ANGIOGRAPHY N/A 11/28/2020   Procedure: LEFT HEART CATH AND CORONARY ANGIOGRAPHY;  Surgeon: Troy Sine, MD;  Location: [place] CV LAB;  Service: Cardiovascular;  Laterality: N/A;   TONSILLECTOMY     UPPER GI ENDOSCOPY  growth removed from voice box   WRIST SURGERY Left    ganglion cyst removal    Social History:  reports that she has been smoking cigarettes. She has a 2.50 pack-year smoking history. She has never used smokeless tobacco. She reports that she does not drink alcohol and does not use drugs.   Allergies  Allergen Reactions    Codeine Nausea And Vomiting   Adhesive [Tape] Other (See Comments)    Tape breaks out the skin if it is left on for a lengthy period of time    Family History  Problem Relation Age of Onset   Stroke Mother    Hypertension Mother    Hyperlipidemia Mother    Hypertension Father    Hyperlipidemia Father       Prior to Admission medications   Medication Sig Start Date End Date Taking? Authorizing Provider  acetaminophen (TYLENOL) 500 MG tablet Take 1,000 mg by mouth every 6 (six) hours as needed for mild pain or headache.   Yes [provider]  albuterol (PROVENTIL) (2.5 MG/3ML) 0.083% nebulizer solution Take 3 mLs (2.5 mg total) by nebulization every 6 (six) hours as needed for wheezing or shortness of breath. 03/30/21  Yes Newlin, Enobong, MD  albuterol (VENTOLIN HFA) 108 (90 Base) MCG/ACT inhaler Inhale 2 puffs into the lungs every 6 (six) hours as needed for wheezing or shortness of breath. 12/12/20  Yes Newlin, Enobong, MD  aspirin 325 MG tablet Take 325 mg by mouth daily.   Yes [provider]  atorvastatin (LIPITOR) 40 MG tablet TAKE 1 TABLET BY MOUTH DAILY AT 6 PM. Patient taking differently: Take 40 mg by mouth daily at 6 PM. 05/23/20  Yes Newlin, Enobong, MD  B Complex-C-Zn-Folic Acid (DIALYVITE 800 WITH ZINC) 0.8 MG TABS Take 1 tablet by mouth at bedtime. 08/12/20  Yes [provider]  calcium acetate (PHOSLO) 667 MG capsule Take 1,334 mg by mouth in the morning, at noon, in the evening, and at bedtime. With meals and a snack   Yes [provider]  carvedilol (COREG) 12.5 MG tablet TAKE 1 TABLET (12.5 MG TOTAL) BY MOUTH 2 (TWO) TIMES DAILY WITH A MEAL. Patient taking differently: Take 12.5 mg by mouth 2 (two) times daily with a meal. 06/15/20 06/15/21 Yes Newlin, Enobong, MD  FLUoxetine (PROZAC) 20 MG capsule Take 1 capsule (20 mg total) by mouth daily. 12/12/20  Yes Newlin, Enobong, MD  gabapentin (NEURONTIN) 100 MG capsule Take 1 capsule by mouth three  times a day Patient taking differently: Take 100 mg by mouth at bedtime. 01/10/21  Yes   hydrOXYzine (ATARAX/VISTARIL) 25 MG tablet Take 1 tablet (25 mg total) by mouth 3 (three) times daily as needed. Patient taking differently: Take 25 mg by mouth 3 (three) times daily as needed for anxiety. 12/12/20  Yes Newlin, Enobong, MD  lidocaine-prilocaine (EMLA) cream Apply 1 application topically See admin instructions. For use on dialysis days. 12/04/20  Yes [provider]  losartan (COZAAR) 50 MG tablet Take 50 mg by mouth every evening.   Yes [provider]  midodrine (PROAMATINE) 10 MG tablet Take 1 tablet (10 mg total) by mouth every Monday, Wednesday, and Friday with hemodialysis. Patient taking differently: Take 10 mg by mouth See admin instructions. Monday ,Wednesday and Friday. As needed if blood pressure is low. 01/31/21  Yes Newlin, Enobong, MD  OXYGEN Inhale 3 L into the lungs continuous.   Yes [provider]  Tiotropium Bromide Monohydrate (SPIRIVA RESPIMAT) 2.5   MCG/ACT AERS Inhale 2 puffs into the lungs daily. 01/30/21  Yes Newlin, Enobong, MD  bacitracin-polymyxin b (POLYSPORIN) ophthalmic ointment Place 1 application into the right eye every 12 (twelve) hours. apply to eye every 12 hours while awake Patient not taking: Reported on 04/04/2021 01/30/21   Newlin, Enobong, MD  Blood Glucose Monitoring Suppl (BLOOD GLUCOSE METER) kit Use as instructed 11/01/13   Abrol, Nayana, MD  Blood Glucose Monitoring Suppl (TRUE METRIX METER) DEVI 1 each by Does not apply route 3 (three) times daily before meals. 06/02/20   Newlin, Enobong, MD  glucose blood (TRUETEST TEST) test strip Use as instructed 06/02/20   Newlin, Enobong, MD  Lancets (FREESTYLE) lancets Use as instructed 11/01/13   Abrol, Nayana, MD  metoCLOPramide (REGLAN) 5 MG tablet TAKE 1 TABLET BY MOUTH FOUR TIMES DAILY, BEFORE MEALS AND AT BEDTIME. Patient not taking: Reported on 04/04/2021 11/13/20 11/13/21  Guenther, Paula M, NP   Misc. Devices MISC Large depends. Diagnosis - fecal incontinence 01/30/21   Newlin, Enobong, MD  TRUEplus Lancets 28G MISC 1 each by Does not apply route 3 (three) times daily before meals. 06/02/20   Newlin, Enobong, MD  amLODipine (NORVASC) 10 MG tablet Take 1 tablet (10 mg total) by mouth daily. 08/01/20 11/09/20  Dahal, Binaya, MD  hydrALAZINE (APRESOLINE) 25 MG tablet Take 1 tablet (25 mg total) by mouth every 8 (eight) hours. 07/31/20 11/09/20  Dahal, Binaya, MD  omeprazole (PRILOSEC) 20 MG capsule TAKE 1 CAPSULE (20 MG TOTAL) BY MOUTH 2 (TWO) TIMES DAILY BEFORE A MEAL. 08/01/20 08/22/20  Newlin, Enobong, MD    Physical Exam: BP (!) 181/57   Pulse 72   Temp 99.9 F (37.7 C) (Oral)   Resp 19   LMP  (LMP Unknown)   SpO2 95%   General: 65 y.o. year-old female well developed well nourished in no acute distress.  Alert and oriented x3.  On high flow nasal cannula and nonrebreather. Cardiovascular: Regular rate and rhythm with no rubs or gallops.  No thyromegaly or JVD noted.  No lower extremity edema. 2/4 pulses in all 4 extremities. Respiratory: Diffuse rales bilaterally with poor inspiratory effort.   Abdomen: Soft nontender nondistended with normal bowel sounds x4 quadrants. Muskuloskeletal: No cyanosis, clubbing or edema noted bilaterally Neuro: CN II-XII intact, strength, sensation, reflexes Skin: No ulcerative lesions noted or rashes Psychiatry: Judgement and insight appear normal. Mood is appropriate for condition and setting          Labs on Admission:  Basic Metabolic Panel: Recent Labs  Lab 04/04/21 2043  NA 137  K 3.2*  CL 98  CO2 28  GLUCOSE 139*  BUN 15  CREATININE 3.19*  CALCIUM 8.9   Liver Function Tests: Recent Labs  Lab 04/04/21 2043  AST 13*  ALT 10  ALKPHOS 68  BILITOT 0.2*  PROT 6.2*  ALBUMIN 2.9*   No results for input(s): LIPASE, AMYLASE in the last 168 hours. No results for input(s): AMMONIA in the last 168 hours. CBC: Recent Labs  Lab  04/04/21 2043  WBC 5.0  NEUTROABS 3.3  HGB 9.9*  HCT 34.1*  MCV 92.2  PLT 211   Cardiac Enzymes: No results for input(s): CKTOTAL, CKMB, CKMBINDEX, TROPONINI in the last 168 hours.  BNP (last 3 results) Recent Labs    05/25/20 0032 11/07/20 1215 12/13/20 0243  BNP 209.4* 1,369.1* 1,380.6*    ProBNP (last 3 results) No results for input(s): PROBNP in the last 8760 hours.  CBG: No results for   input(s): GLUCAP in the last 168 hours.  Radiological Exams on Admission: DG Chest Portable 1 View  Result Date: 04/04/2021 CLINICAL DATA:  Shortness of breath EXAM: PORTABLE CHEST 1 VIEW COMPARISON:  Radiograph 02/20/2021, chest CT 04/29/2009 FINDINGS: Enlarged cardiac silhouette. There is diffuse interstitial in bilateral lower lung airspace opacities. There are small bilateral pleural effusions with adjacent atelectasis. There is no visible pneumothorax. There is no acute osseous abnormality. IMPRESSION: Cardiomegaly with mild-to-moderate pulmonary edema and small bilateral pleural effusions with adjacent atelectasis. Electronically Signed   By: Jacob  Kahn   On: 04/04/2021 21:09    EKG: I independently viewed the EKG done and my findings are as followed: Sinus rhythm rate of 64.  Nonspecific ST-T changes.  QTc 433.  Assessment/Plan Present on Admission:  Acute on chronic respiratory failure with hypoxia (HCC)  Active Problems:   Acute on chronic respiratory failure with hypoxia (HCC)  Acute on chronic hypoxic respiratory failure possibly secondary to COVID-19 viral pneumonia, likely superimposed by bacterial pneumonia, POA. Patient reports fever at home with T-max 102 and a positive COVID-19 screening test at home. COVID-19 screening test pending at the time of this dictation. Returned positive on 04/04/2021.  She will need 21-day of airborne isolation while symptomatic. At baseline she is on 4 L nasal cannula, currently requiring 6 L. Started on IV Solu-Medrol and Zosyn Pulmonary  toilette, bronchodilators Antitussives Incentive spirometer Mobilize as tolerated Maintain O2 saturation greater than 90% Trend inflammatory markers  PUI, tested positive for COVID at home. Follow COVID-19 screening test. Follow inflammatory markers Rest of management as stated above  ESRD on HD MWF Nephrology has been contacted by EDP Last hemoglobin session was today 04/04/2021.  Hypertension BP is currently not at goal, elevated Resume home oral antihypertensives IV antihypertensive as needed with parameters Continue to closely monitor vital signs  Hypokalemia Serum potassium 3.2 Volume status and electrolytes addressed with hemodialysis. Nephrology has been made aware of patient's admission by EDP.  Anemia of chronic kidney disease Hemoglobin 9.1, stable Monitor H&H  Generalized weakness PT OT assessment starting on 04/06/2021 Fall precautions  DVT prophylaxis: Subcu heparin 3 times daily  Code Status: Full code  Family Communication: None at bedside  Disposition Plan: Admitted to progressive unit  Consults called: Nephrology contacted by EDP.  Admission status: Inpatient status.  Patient will require at least 2 midnights for further evaluation and treatment of present condition.   Status is: Inpatient    Dispo:  Patient From: Home  Planned Disposition: Home, possibly on 04/06/2021 or when symptomatology has improved.  Medically stable for discharge: No         Carole N Hall MD [facility] Pager [phone]  If 7PM-7AM, please contact night-coverage [email or web address] Password [email or web address]  04/04/2021, 11:22 PM   

## 2021-04-04 NOTE — ED Triage Notes (Signed)
Pt says she tested positive this morning. Pain in her chest, feels bad. Last tylenol about 2 hours ago. On chronic O2

## 2021-04-04 NOTE — ED Provider Notes (Signed)
[facility] EMERGENCY DEPARTMENT Provider Note   CSN: [id number] Arrival date & time: 04/04/21  2012     History Chief Complaint  Patient presents with   Shortness of Breath    Tina Gomez is a 65 y.o. female with PMH/o COPD, DM, HLD, HTN who presents for evaluation of SOB, cough. She reports that about 2:30 am she started feeling tired, fatigued, and having cough. She tested for COVID and was negative.  She did her dialysis session today and states that she they took the normal amount of fluid off.  She reports that at about 2:30 PM, she woke up from a nap and states that she had a fever of 102 she felt tired, achy and felt like she was having worsening shortness of breath.  She is on 4 L of oxygen at baseline at home.  She states that with her 4 L of oxygen, she still did not feel like she could breathe normally.  She turned it up to 5 but still states that she was having trouble breathing.  She did at home test and tested positive.  She tried using inhalers with minimal improvement.  She took Tylenol for her fever.  She has had some post of tussive emesis but denies any vomiting, abdominal pain, chest pain.  On EMS arrival, patient was dropping into the mid 80s on her normal 4 L.  They took her up to 6 L and have her satting at 95.  She has been vaccinated x2.  No booster.  She is a Monday, Wednesday, Friday, Saturday dialysis.  The history is provided by the patient.      Past Medical History:  Diagnosis Date   Anemia    Chronic kidney disease    COPD (chronic obstructive pulmonary disease) ([place]) 05/2019   no inhaler   Diabetes mellitus without complication ([place])    no meds - diet controlled   History of blood transfusion 09/2020   1 unit   HLD (hyperlipidemia)    Hypertension    PONV (postoperative nausea and vomiting)    Restless legs syndrome (RLS)    Stroke [facility])     Patient Active Problem List   Diagnosis Date Noted   Acute on chronic respiratory  failure with hypoxia ([place]) 04/04/2021   Fluctuating blood pressure 03/08/2021   Chronic kidney disease 03/08/2021   Acute pulmonary edema (HCC)    Volume overload 12/13/2020   CAD (coronary artery disease) 12/13/2020   Acute respiratory failure with hypoxia ([place]) 12/13/2020   Shortness of breath    CHF (congestive heart failure) ([place]) 11/07/2020   Protein-calorie malnutrition, severe 07/28/2020   ARF (acute renal failure) ([place]) 07/23/2020   Diabetic gastroparesis (HCC)    Abnormal finding on GI tract imaging    H. pylori infection    Diverticulitis 05/24/2020   History of stroke 05/24/2020   Anemia 05/24/2020   Nausea and vomiting 05/24/2020   AKI (acute kidney injury) ([place]) 05/24/2020   Dehydration 05/24/2020   Hypomagnesemia 05/24/2020   Hypokalemia 05/24/2020   Tobacco abuse 05/24/2020   Emphysema of lung ([place]) 05/24/2020   Psoriasis 02/14/2016   Type 2 diabetes mellitus with diabetic neuropathy, unspecified ([place]) 01/18/2014   Essential hypertension, benign 01/18/2014   GERD (gastroesophageal reflux disease) 01/18/2014   Hyperlipidemia 01/18/2014   History of CVA (cerebrovascular accident) 10/22/2013   Malignant hypertension 10/22/2013    Past Surgical History:  Procedure Laterality Date   AV FISTULA PLACEMENT Left 07/27/2020  Procedure: LEFT BRACHIO-BASILIC ARTERIOVENOUS (AV) FISTULA CREATION;  Surgeon: Cherre Robins, MD;  Location: [place];  Service: Vascular;  Laterality: Left;   [place] Left 11/02/2020   Procedure: LEFT UPPER ARM SECOND STAGE BASILIC VEIN TRANSPOSITION;  Surgeon: Cherre Robins, MD;  Location: [place];  Service: Vascular;  Laterality: Left;  PERIPHERAL NERVE BLOCK   CESAREAN SECTION     x 1   ESOPHAGOGASTRODUODENOSCOPY (EGD) WITH PROPOFOL N/A 05/26/2020   Procedure: ESOPHAGOGASTRODUODENOSCOPY (EGD) WITH PROPOFOL;  Surgeon: Irene Shipper, MD;  Location: [facility] ENDOSCOPY;  Service: Endoscopy;  Laterality: N/A;   INSERTION OF DIALYSIS  CATHETER Right 07/27/2020   Procedure: INSERTION OF RIGHT INTERNAL JUGULAR TUNNELED DIALYSIS CATHETER;  Surgeon: Cherre Robins, MD;  Location: [place];  Service: Vascular;  Laterality: Right;   IR FLUORO GUIDE CV LINE RIGHT  07/25/2020   IR US GUIDE VASC ACCESS RIGHT  07/25/2020   LEFT HEART CATH AND CORONARY ANGIOGRAPHY N/A 11/28/2020   Procedure: LEFT HEART CATH AND CORONARY ANGIOGRAPHY;  Surgeon: Troy Sine, MD;  Location: [facility] LAB;  Service: Cardiovascular;  Laterality: N/A;   TONSILLECTOMY     UPPER GI ENDOSCOPY     growth removed from voice box   WRIST SURGERY Left    ganglion cyst removal     OB History   No obstetric history on file.     Family History  Problem Relation Age of Onset   Stroke Mother    Hypertension Mother    Hyperlipidemia Mother    Hypertension Father    Hyperlipidemia Father     Social History   Tobacco Use   Smoking status: Every Day    Packs/day: 0.25    Years: 10.00    Pack years: 2.50    Types: Cigarettes   Smokeless tobacco: Never   Tobacco comments:    5 cigs daily  Vaping Use   Vaping Use: Never used  Substance Use Topics   Alcohol use: No   Drug use: No    Home Medications Prior to Admission medications   Medication Sig Start Date End Date Taking? Authorizing Provider  acetaminophen (TYLENOL) 500 MG tablet Take 1,000 mg by mouth every 6 (six) hours as needed for mild pain or headache.   Yes [provider]  albuterol (PROVENTIL) (2.5 MG/3ML) 0.083% nebulizer solution Take 3 mLs (2.5 mg total) by nebulization every 6 (six) hours as needed for wheezing or shortness of breath. 03/30/21  Yes Newlin, Enobong, MD  albuterol (VENTOLIN HFA) 108 (90 Base) MCG/ACT inhaler Inhale 2 puffs into the lungs every 6 (six) hours as needed for wheezing or shortness of breath. 12/12/20  Yes Charlott Rakes, MD  aspirin 325 MG tablet Take 325 mg by mouth daily.   Yes [provider]  atorvastatin (LIPITOR) 40 MG tablet  TAKE 1 TABLET BY MOUTH DAILY AT 6 PM. Patient taking differently: Take 40 mg by mouth daily at 6 PM. 05/23/20  Yes Newlin, Enobong, MD  B Complex-C-Zn-Folic Acid (DIALYVITE 676 WITH ZINC) 0.8 MG TABS Take 1 tablet by mouth at bedtime. 08/12/20  Yes [provider]  calcium acetate (PHOSLO) 667 MG capsule Take 1,334 mg by mouth in the morning, at noon, in the evening, and at bedtime. With meals and a snack   Yes [provider]  carvedilol (COREG) 12.5 MG tablet TAKE 1 TABLET (12.5 MG TOTAL) BY MOUTH 2 (TWO) TIMES DAILY WITH A MEAL. Patient taking differently: Take 12.5 mg  by mouth 2 (two) times daily with a meal. 06/15/20 06/15/21 Yes Newlin, Enobong, MD  FLUoxetine (PROZAC) 20 MG capsule Take 1 capsule (20 mg total) by mouth daily. 12/12/20  Yes Charlott Rakes, MD  gabapentin (NEURONTIN) 100 MG capsule Take 1 capsule by mouth three times a day Patient taking differently: Take 100 mg by mouth at bedtime. 01/10/21  Yes   hydrOXYzine (ATARAX/VISTARIL) 25 MG tablet Take 1 tablet (25 mg total) by mouth 3 (three) times daily as needed. Patient taking differently: Take 25 mg by mouth 3 (three) times daily as needed for anxiety. 12/12/20  Yes Charlott Rakes, MD  lidocaine-prilocaine (EMLA) cream Apply 1 application topically See admin instructions. For use on dialysis days. 12/04/20  Yes [provider]  losartan (COZAAR) 50 MG tablet Take 50 mg by mouth every evening.   Yes [provider]  midodrine (PROAMATINE) 10 MG tablet Take 1 tablet (10 mg total) by mouth every Monday, Wednesday, and Friday with hemodialysis. Patient taking differently: Take 10 mg by mouth See admin instructions. Monday ,Wednesday and Friday. As needed if blood pressure is low. 01/31/21  Yes Newlin, Enobong, MD  OXYGEN Inhale 3 L into the lungs continuous.   Yes [provider]  Tiotropium Bromide Monohydrate (SPIRIVA RESPIMAT) 2.5 MCG/ACT AERS Inhale 2 puffs into the lungs daily. 01/30/21  Yes  Charlott Rakes, MD  bacitracin-polymyxin b (POLYSPORIN) ophthalmic ointment Place 1 application into the right eye every 12 (twelve) hours. apply to eye every 12 hours while awake Patient not taking: Reported on 04/04/2021 01/30/21   Charlott Rakes, MD  Blood Glucose Monitoring Suppl (BLOOD GLUCOSE METER) kit Use as instructed 11/01/13   Reyne Dumas, MD  Blood Glucose Monitoring Suppl (TRUE METRIX METER) DEVI 1 each by Does not apply route 3 (three) times daily before meals. 06/02/20   Charlott Rakes, MD  glucose blood (TRUETEST TEST) test strip Use as instructed 06/02/20   Charlott Rakes, MD  Lancets (FREESTYLE) lancets Use as instructed 11/01/13   Reyne Dumas, MD  metoCLOPramide (REGLAN) 5 MG tablet TAKE 1 TABLET BY MOUTH FOUR TIMES DAILY, BEFORE MEALS AND AT BEDTIME. Patient not taking: Reported on 04/04/2021 11/13/20 11/13/21  Willia Craze, NP  Misc. Devices MISC Large depends. Diagnosis - fecal incontinence 01/30/21   Charlott Rakes, MD  TRUEplus Lancets 28G MISC 1 each by Does not apply route 3 (three) times daily before meals. 06/02/20   Charlott Rakes, MD  amLODipine (NORVASC) 10 MG tablet Take 1 tablet (10 mg total) by mouth daily. 08/01/20 11/09/20  Terrilee Croak, MD  hydrALAZINE (APRESOLINE) 25 MG tablet Take 1 tablet (25 mg total) by mouth every 8 (eight) hours. 07/31/20 11/09/20  Terrilee Croak, MD  omeprazole (PRILOSEC) 20 MG capsule TAKE 1 CAPSULE (20 MG TOTAL) BY MOUTH 2 (TWO) TIMES DAILY BEFORE A MEAL. 08/01/20 08/22/20  Charlott Rakes, MD    Allergies    Codeine and Adhesive [tape]  Review of Systems   Review of Systems  Constitutional:  Positive for fatigue and fever.  Respiratory:  Positive for cough and shortness of breath.   Cardiovascular:  Negative for chest pain.  Gastrointestinal:  Negative for abdominal pain, nausea and vomiting.  Genitourinary:  Negative for dysuria and hematuria.  Neurological:  Negative for headaches.  All other systems reviewed and are  negative.  Physical Exam Updated Vital Signs BP (!) 181/57   Pulse 72   Temp 99.9 F (37.7 C) (Oral)   Resp 19   LMP  (LMP  Unknown)   SpO2 95%   Physical Exam Vitals and nursing note reviewed.  Constitutional:      Appearance: Normal appearance. She is well-developed.  HENT:     Head: Normocephalic and atraumatic.  Eyes:     General: Lids are normal.     Conjunctiva/sclera: Conjunctivae normal.     Pupils: Pupils are equal, round, and reactive to light.  Cardiovascular:     Rate and Rhythm: Normal rate and regular rhythm.     Pulses: Normal pulses.     Heart sounds: Normal heart sounds. No murmur heard.   No friction rub. No gallop.     Comments: AV fistula noted to LUE with positive thrill.  Pulmonary:     Effort: Pulmonary effort is normal.     Breath sounds: Wheezing and rhonchi present.     Comments: Speaking in medium to short sentences.  Diffuse wheezing, rhonchi present. Abdominal:     Palpations: Abdomen is soft. Abdomen is not rigid.     Tenderness: There is no abdominal tenderness. There is no guarding.     Comments: Abdomen is soft, non-distended, non-tender. No rigidity, No guarding. No peritoneal signs.  Musculoskeletal:        General: Normal range of motion.     Cervical back: Full passive range of motion without pain.  Skin:    General: Skin is warm and dry.     Capillary Refill: Capillary refill takes less than 2 seconds.  Neurological:     Mental Status: She is alert and oriented to person, place, and time.  Psychiatric:        Speech: Speech normal.    ED Results / Procedures / Treatments   Labs (all labs ordered are listed, but only abnormal results are displayed) Labs Reviewed  RESP PANEL BY RT-PCR (FLU A&B, COVID) ARPGX2 - Abnormal; Notable for the following components:      Result Value   SARS Coronavirus 2 by RT PCR POSITIVE (*)    All other components within normal limits  COMPREHENSIVE METABOLIC PANEL - Abnormal; Notable for the  following components:   Potassium 3.2 (*)    Glucose, Bld 139 (*)    Creatinine, Ser 3.19 (*)    Total Protein 6.2 (*)    Albumin 2.9 (*)    AST 13 (*)    Total Bilirubin 0.2 (*)    GFR, Estimated 16 (*)    All other components within normal limits  CBC WITH DIFFERENTIAL/PLATELET - Abnormal; Notable for the following components:   RBC 3.70 (*)    Hemoglobin 9.9 (*)    HCT 34.1 (*)    MCHC 29.0 (*)    RDW 19.0 (*)    Abs Immature Granulocytes 0.10 (*)    All other components within normal limits  CBC WITH DIFFERENTIAL/PLATELET  COMPREHENSIVE METABOLIC PANEL  C-REACTIVE PROTEIN  D-DIMER, QUANTITATIVE  FERRITIN    EKG EKG Interpretation  Date/Time:  Wednesday April 04 2021 20:14:53 EDT Ventricular Rate:  64 PR Interval:  250 QRS Duration: 94 QT Interval:  420 QTC Calculation: 433 R Axis:   17 Text Interpretation: Sinus rhythm with 1st degree A-V block Possible Anterior infarct , age undetermined Abnormal ECG No significant change since last tracing Confirmed by Wandra Arthurs [phone]) on 04/04/2021 10:03:51 PM  Radiology DG Chest Portable 1 View  Result Date: 04/04/2021 CLINICAL DATA:  Shortness of breath EXAM: PORTABLE CHEST 1 VIEW COMPARISON:  Radiograph 02/20/2021, chest CT 04/29/2009 FINDINGS: Enlarged cardiac silhouette. There  is diffuse interstitial in bilateral lower lung airspace opacities. There are small bilateral pleural effusions with adjacent atelectasis. There is no visible pneumothorax. There is no acute osseous abnormality. IMPRESSION: Cardiomegaly with mild-to-moderate pulmonary edema and small bilateral pleural effusions with adjacent atelectasis. Electronically Signed   By: Maurine Simmering   On: 04/04/2021 21:09    Procedures Procedures   Medications Ordered in ED Medications  Ipratropium-Albuterol (COMBIVENT) respimat 1 puff (has no administration in time range)  ascorbic acid (VITAMIN C) tablet 500 mg (has no administration in time range)  zinc sulfate  capsule 220 mg (has no administration in time range)  guaiFENesin-dextromethorphan (ROBITUSSIN DM) 100-10 MG/5ML syrup 10 mL (has no administration in time range)  methylPREDNISolone sodium succinate (SOLU-MEDROL) 125 mg/2 mL injection 80 mg (has no administration in time range)    Followed by  predniSONE (DELTASONE) tablet 50 mg (has no administration in time range)  multivitamin with minerals tablet 1 tablet (has no administration in time range)  heparin injection 5,000 Units (5,000 Units Subcutaneous Given 04/04/21 2341)  acetaminophen (TYLENOL) tablet 650 mg (has no administration in time range)  methylPREDNISolone sodium succinate (SOLU-MEDROL) 125 mg/2 mL injection 80 mg (80 mg Intravenous Given 04/04/21 2054)    ED Course  I have reviewed the triage vital signs and the nursing notes.  Pertinent labs & imaging results that were available during my care of the patient were reviewed by me and considered in my medical decision making (see chart for details).    MDM Rules/Calculators/A&P                            65 year old female past ministry of COPD, end-stage renal disease (Monday, Wednesday, Friday, Saturday dialysis) who presents for evaluation of shortness of breath, cough.  Patient reports she tested positive for COVID at home.  She has been vaccinated x2.  She is on oxygen at baseline with 4 L.  When EMS got there, she was hypoxic into the mid high 80s on her 4 L.  They bumped her up to 6 which with improvement.  On initially arrival, she is satting at around 95-97 on 6 L.  She has diffuse wheezing, rhonchi noted.  We will plan for labs, chest x-ray.  Will give Solu-Medrol.  Question if this is a COPD exacerbation on top of COVID.  CBC shows no leukocytosis.  Hemoglobin stable at 9.9.  CMP shows potassium 3.3.  BUN 15, creatinine 3.19.  Chest x-ray shows cardiomegaly with mild to moderate pulmonary edema and small bilateral pleural effusions with adjacent atelectasis.  I  discussed with Dr. Joelyn Oms (nephrology).  He feels like this is most likely COVID/COPD related given that she does have dialysis today and creatinine is stable.  Recommends treating pulmonary process.  No emergent dialysis needed.  Re-evalution.  Patient does report some improvement after Solu-Medrol.  On my exam, wheezing is improved but she still has diffuse wheezing still requiring 6 L of oxygen.  We will plan for admission  Discussed patient with Dr. Nevada Crane (hospitalist) who accepts patient for admission.   Portions of this note were generated with [employer]. Dictation errors may occur despite best attempts at proofreading.  Final Clinical Impression(s) / ED Diagnoses Final diagnoses:  COVID-19  COPD exacerbation [facility])    Rx / DC Orders ED Discharge Orders     None        Volanda Napoleon, PA-C 04/04/21 2351  Drenda Freeze, MD 04/05/21 1537

## 2021-04-04 NOTE — Telephone Encounter (Signed)
Pt's daughter calling, reports pt tested covid positive this evening. States family members in home had tested positive earlier. Pt with CP, SOB at rest, headache, fatigue, cough, vomiting, onset this evening except headache, onset this AM.  On home O2, 95% on 3ls, drops to 80% on RA. States pt has been able to go without dropping on RA. States SOB at rest, with speaking.   Advised ED, states will follow disposition, will call EMS. Advised to alert of covid positive status when calling EMS.  Reason for Disposition  SEVERE or constant chest pain or pressure  (Exception: Mild central chest pain, present only when coughing.)  Answer Assessment - Initial Assessment Questions 1. COVID-19 DIAGNOSIS: "Who made your COVID-19 diagnosis?" "Was it confirmed by a positive lab test or self-test?" If not diagnosed by a doctor (or NP/PA), ask "Are there lots of cases (community spread) where you live?" Note: See public health department website, if unsure.     Home test this evening 2. COVID-19 EXPOSURE: "Was there any known exposure to COVID before the symptoms began?" CDC Definition of close contact: within 6 feet (2 meters) for a total of 15 minutes or more over a 24-hour period.      Yes, family has covid, household 3. ONSET: "When did the COVID-19 symptoms start?"      This AM 4. WORST SYMPTOM: "What is your worst symptom?" (e.g., cough, fever, shortness of breath, muscle aches)     CP, Sob, with wheezing 5. COUGH: "Do you have a cough?" If Yes, ask: "How bad is the cough?"       Cough, productive for clear phlegm. 6. FEVER: "Do you have a fever?" If Yes, ask: "What is your temperature, how was it measured, and when did it start?"     102.2 7. RESPIRATORY STATUS: "Describe your breathing?" (e.g., shortness of breath, wheezing, unable to speak)      SOB at rest 8. BETTER-SAME-WORSE: "Are you getting better, staying the same or getting worse compared to yesterday?"  If getting worse, ask, "In what way?"      worse 9. HIGH RISK DISEASE: "Do you have any chronic medical problems?" (e.g., asthma, heart or lung disease, weak immune system, obesity, etc.)     yes 10. VACCINE: "Have you had the COVID-19 vaccine?" If Yes, ask: "Which one, how many shots, when did you get it?"       Yes 11. BOOSTER: "Have you received your COVID-19 booster?" If Yes, ask: "Which one and when did you get it?"       None  13. OTHER SYMPTOMS: "Do you have any other symptoms?"  (e.g., chills, fatigue, headache, loss of smell or taste, muscle pain, sore throat)       Headache, fatigue, fever, vomiting, SOB 14. O2 SATURATION MONITOR:  "Do you use an oxygen saturation monitor (pulse oximeter) at home?" If Yes, ask "What is your reading (oxygen level) today?" "What is your usual oxygen saturation reading?" (e.g., 95%)       95% on 3 ls O2, drops to 80% on RA  Protocols used: Coronavirus (COVID-19) Diagnosed or Suspected-A-AH

## 2021-04-04 NOTE — ED Triage Notes (Signed)
Pt arrives via [facility] from home, with c/o sob, +COVID today. Dialysis today. 180/70, HR 64, 95% 6 Liters (normally only on 4). 102 temp at home. CBG 142.

## 2021-04-05 ENCOUNTER — Encounter (HOSPITAL_COMMUNITY): Payer: Self-pay | Admitting: Internal Medicine

## 2021-04-05 DIAGNOSIS — R531 Weakness: Secondary | ICD-10-CM

## 2021-04-05 DIAGNOSIS — N186 End stage renal disease: Secondary | ICD-10-CM

## 2021-04-05 DIAGNOSIS — J069 Acute upper respiratory infection, unspecified: Secondary | ICD-10-CM | POA: Diagnosis present

## 2021-04-05 DIAGNOSIS — Z992 Dependence on renal dialysis: Secondary | ICD-10-CM

## 2021-04-05 DIAGNOSIS — U071 COVID-19: Secondary | ICD-10-CM | POA: Diagnosis present

## 2021-04-05 DIAGNOSIS — J9621 Acute and chronic respiratory failure with hypoxia: Secondary | ICD-10-CM | POA: Diagnosis not present

## 2021-04-05 LAB — COMPREHENSIVE METABOLIC PANEL
ALT: 9 U/L (ref 0–44)
AST: 12 U/L — ABNORMAL LOW (ref 15–41)
Albumin: 2.8 g/dL — ABNORMAL LOW (ref 3.5–5.0)
Alkaline Phosphatase: 70 U/L (ref 38–126)
Anion gap: 10 (ref 5–15)
BUN: 20 mg/dL (ref 8–23)
CO2: 28 mmol/L (ref 22–32)
Calcium: 8.6 mg/dL — ABNORMAL LOW (ref 8.9–10.3)
Chloride: 99 mmol/L (ref 98–111)
Creatinine, Ser: 3.81 mg/dL — ABNORMAL HIGH (ref 0.44–1.00)
GFR, Estimated: 13 mL/min — ABNORMAL LOW (ref >60)
Glucose, Bld: 222 mg/dL — ABNORMAL HIGH (ref 70–99)
Potassium: 3.2 mmol/L — ABNORMAL LOW (ref 3.5–5.1)
Sodium: 137 mmol/L (ref 135–145)
Total Bilirubin: 0.2 mg/dL — ABNORMAL LOW (ref 0.3–1.2)
Total Protein: 6.1 g/dL — ABNORMAL LOW (ref 6.5–8.1)

## 2021-04-05 LAB — CBC WITH DIFFERENTIAL/PLATELET
Abs Immature Granulocytes: 0.09 10*3/uL — ABNORMAL HIGH (ref 0.00–0.07)
Basophils Absolute: 0 10*3/uL (ref 0.0–0.1)
Basophils Relative: 0 %
Eosinophils Absolute: 0 10*3/uL (ref 0.0–0.5)
Eosinophils Relative: 0 %
HCT: 33.7 % — ABNORMAL LOW (ref 36.0–46.0)
Hemoglobin: 9.6 g/dL — ABNORMAL LOW (ref 12.0–15.0)
Immature Granulocytes: 2 %
Lymphocytes Relative: 9 %
Lymphs Abs: 0.4 10*3/uL — ABNORMAL LOW (ref 0.7–4.0)
MCH: 26.7 pg (ref 26.0–34.0)
MCHC: 28.5 g/dL — ABNORMAL LOW (ref 30.0–36.0)
MCV: 93.9 fL (ref 80.0–100.0)
Monocytes Absolute: 0.1 10*3/uL (ref 0.1–1.0)
Monocytes Relative: 4 %
Neutro Abs: 3.4 10*3/uL (ref 1.7–7.7)
Neutrophils Relative %: 85 %
Platelets: 185 10*3/uL (ref 150–400)
RBC: 3.59 MIL/uL — ABNORMAL LOW (ref 3.87–5.11)
RDW: 18.6 % — ABNORMAL HIGH (ref 11.5–15.5)
WBC: 4 10*3/uL (ref 4.0–10.5)
nRBC: 0 % (ref 0.0–0.2)

## 2021-04-05 LAB — PROCALCITONIN: Procalcitonin: 0.3 ng/mL

## 2021-04-05 LAB — FERRITIN: Ferritin: 310 ng/mL — ABNORMAL HIGH (ref 11–307)

## 2021-04-05 LAB — D-DIMER, QUANTITATIVE: D-Dimer, Quant: 1.24 ug/mL-FEU — ABNORMAL HIGH (ref 0.00–0.50)

## 2021-04-05 LAB — C-REACTIVE PROTEIN: CRP: 4.2 mg/dL — ABNORMAL HIGH (ref <1.0)

## 2021-04-05 MED ORDER — HYDRALAZINE HCL 25 MG PO TABS
25.0000 mg | ORAL_TABLET | Freq: Four times a day (QID) | ORAL | Status: DC | PRN
  Administered 2021-04-06 – 2021-04-13 (×9): 25 mg via ORAL
  Filled 2021-04-05 (×11): qty 1

## 2021-04-05 MED ORDER — SODIUM CHLORIDE 0.9 % IV SOLN
100.0000 mg | Freq: Every day | INTRAVENOUS | Status: DC
  Filled 2021-04-05 (×4): qty 20

## 2021-04-05 MED ORDER — SODIUM CHLORIDE 0.9 % IV SOLN
200.0000 mg | Freq: Once | INTRAVENOUS | Status: DC
  Filled 2021-04-05: qty 40

## 2021-04-05 MED ORDER — TIOTROPIUM BROMIDE MONOHYDRATE 2.5 MCG/ACT IN AERS: 2.0000 | INHALATION_SPRAY | Freq: Every day | RESPIRATORY_TRACT | Status: DC

## 2021-04-05 MED ORDER — ATORVASTATIN CALCIUM 40 MG PO TABS
40.0000 mg | ORAL_TABLET | Freq: Every day | ORAL | Status: DC
  Administered 2021-04-05 – 2021-04-14 (×9): 40 mg via ORAL
  Filled 2021-04-05 (×9): qty 1

## 2021-04-05 MED ORDER — PIPERACILLIN-TAZOBACTAM IN DEX 2-0.25 GM/50ML IV SOLN
2.2500 g | Freq: Three times a day (TID) | INTRAVENOUS | Status: DC
  Filled 2021-04-05 (×2): qty 50

## 2021-04-05 MED ORDER — ONDANSETRON HCL 4 MG/2ML IJ SOLN
4.0000 mg | Freq: Four times a day (QID) | INTRAMUSCULAR | Status: DC | PRN
  Administered 2021-04-10: 4 mg via INTRAVENOUS
  Filled 2021-04-05: qty 2

## 2021-04-05 MED ORDER — ALBUTEROL SULFATE (2.5 MG/3ML) 0.083% IN NEBU: 2.5000 mg | INHALATION_SOLUTION | Freq: Four times a day (QID) | RESPIRATORY_TRACT | Status: DC | PRN

## 2021-04-05 MED ORDER — CHLORHEXIDINE GLUCONATE CLOTH 2 % EX PADS
6.0000 | MEDICATED_PAD | Freq: Every day | CUTANEOUS | Status: DC
  Administered 2021-04-06 – 2021-04-15 (×10): 6 via TOPICAL

## 2021-04-05 MED ORDER — CARVEDILOL 12.5 MG PO TABS
12.5000 mg | ORAL_TABLET | Freq: Two times a day (BID) | ORAL | Status: DC
  Administered 2021-04-05 – 2021-04-09 (×8): 12.5 mg via ORAL
  Filled 2021-04-05 (×9): qty 1

## 2021-04-05 MED ORDER — ASPIRIN 325 MG PO TABS
325.0000 mg | ORAL_TABLET | Freq: Every day | ORAL | Status: DC
  Administered 2021-04-05 – 2021-04-15 (×11): 325 mg via ORAL
  Filled 2021-04-05 (×11): qty 1

## 2021-04-05 MED ORDER — CALCIUM ACETATE (PHOS BINDER) 667 MG PO CAPS
1334.0000 mg | ORAL_CAPSULE | Freq: Three times a day (TID) | ORAL | Status: DC
  Administered 2021-04-05 – 2021-04-15 (×27): 1334 mg via ORAL
  Filled 2021-04-05 (×28): qty 2

## 2021-04-05 MED ORDER — PIPERACILLIN-TAZOBACTAM 3.375 G IVPB 30 MIN
3.3750 g | Freq: Once | INTRAVENOUS | Status: AC
  Administered 2021-04-05: 3.375 g via INTRAVENOUS
  Filled 2021-04-05: qty 50

## 2021-04-05 MED ORDER — MELATONIN 3 MG PO TABS
3.0000 mg | ORAL_TABLET | Freq: Every evening | ORAL | Status: DC | PRN
  Administered 2021-04-11 – 2021-04-12 (×2): 3 mg via ORAL
  Filled 2021-04-05 (×2): qty 1

## 2021-04-05 MED ORDER — GABAPENTIN 100 MG PO CAPS
100.0000 mg | ORAL_CAPSULE | Freq: Every day | ORAL | Status: DC
  Administered 2021-04-05 – 2021-04-14 (×11): 100 mg via ORAL
  Filled 2021-04-05 (×11): qty 1

## 2021-04-05 MED ORDER — POLYETHYLENE GLYCOL 3350 17 G PO PACK
17.0000 g | PACK | Freq: Every day | ORAL | Status: DC | PRN
Start: 2021-04-05 — End: 2021-04-15

## 2021-04-05 MED ORDER — LOSARTAN POTASSIUM 50 MG PO TABS
50.0000 mg | ORAL_TABLET | Freq: Every evening | ORAL | Status: DC
  Administered 2021-04-05 – 2021-04-08 (×3): 50 mg via ORAL
  Filled 2021-04-05 (×4): qty 1

## 2021-04-05 MED ORDER — ALBUTEROL SULFATE HFA 108 (90 BASE) MCG/ACT IN AERS: 2.0000 | INHALATION_SPRAY | Freq: Four times a day (QID) | RESPIRATORY_TRACT | Status: DC | PRN

## 2021-04-05 MED ORDER — FLUOXETINE HCL 20 MG PO CAPS
20.0000 mg | ORAL_CAPSULE | Freq: Every day | ORAL | Status: DC
  Administered 2021-04-05 – 2021-04-15 (×11): 20 mg via ORAL
  Filled 2021-04-05 (×11): qty 1

## 2021-04-05 MED ORDER — UMECLIDINIUM BROMIDE 62.5 MCG/INH IN AEPB
1.0000 | INHALATION_SPRAY | Freq: Every day | RESPIRATORY_TRACT | Status: DC
  Administered 2021-04-06 – 2021-04-07 (×2): 1 via RESPIRATORY_TRACT
  Filled 2021-04-05: qty 7

## 2021-04-05 NOTE — Consult Note (Signed)
[facility] Renal Consultation Note    Indication for Consultation:  Management of ESRD/hemodialysis; anemia, hypertension/volume and secondary hyperparathyroidism  RWE:RXVQMG, Charlane Ferretti, MD  HPI: Tina Gomez is a 65 y.o. female with ESRD on HD MWF at [facility].  Past medical history significant for COPD, chronic hypoxia on 4L home O2 at baseline, HTN, HL, chronic anxiety/depression, and DMT2.  Patient presented to the ED due to dyspnea.  Reports she completed dialysis, went home to take a nap and woke up short of breath, weak, tired with a cough and had a fever of 101.  She called her PCP who recommended calling EMS.  Reports her daughter and grand daughter who live in the house with her tested positive last week but she had been isolating from them.  States she was asymptomatic prior to going to dialysis.  Denies CP, abdominal pain, and n/v/d.  Currently patient is feeling a little better, states breathing has improved significantly with "this machine."  Pertinent findings in the ED include +COVID test, tmax 99.9, hypertension, hypoxia with increased O2 requirement on high flow nasal cannula and CXR with mild-to-moderate pulmonary edema and small bilateral pleural effusions with adjacent atelectasis.  Patient has been admitted for further evaluation and management.   Past Medical History:  Diagnosis Date   Anemia    Chronic kidney disease    COPD (chronic obstructive pulmonary disease) ([place]) 05/2019   no inhaler   Diabetes mellitus without complication ([place])    no meds - diet controlled   History of blood transfusion 09/2020   1 unit   HLD (hyperlipidemia)    Hypertension    PONV (postoperative nausea and vomiting)    Restless legs syndrome (RLS)    Stroke [facility])    Past Surgical History:  Procedure Laterality Date   AV FISTULA PLACEMENT Left 07/27/2020   Procedure: LEFT BRACHIO-BASILIC ARTERIOVENOUS (AV) FISTULA CREATION;  Surgeon: Cherre Robins, MD;   Location: [place];  Service: Vascular;  Laterality: Left;   [place] Left 11/02/2020   Procedure: LEFT UPPER ARM SECOND STAGE BASILIC VEIN TRANSPOSITION;  Surgeon: Cherre Robins, MD;  Location: [place];  Service: Vascular;  Laterality: Left;  PERIPHERAL NERVE BLOCK   CESAREAN SECTION     x 1   ESOPHAGOGASTRODUODENOSCOPY (EGD) WITH PROPOFOL N/A 05/26/2020   Procedure: ESOPHAGOGASTRODUODENOSCOPY (EGD) WITH PROPOFOL;  Surgeon: Irene Shipper, MD;  Location: [place];  Service: Endoscopy;  Laterality: N/A;   INSERTION OF DIALYSIS CATHETER Right 07/27/2020   Procedure: INSERTION OF RIGHT INTERNAL JUGULAR TUNNELED DIALYSIS CATHETER;  Surgeon: Cherre Robins, MD;  Location: [place];  Service: Vascular;  Laterality: Right;   IR FLUORO GUIDE CV LINE RIGHT  07/25/2020   IR US GUIDE VASC ACCESS RIGHT  07/25/2020   LEFT HEART CATH AND CORONARY ANGIOGRAPHY N/A 11/28/2020   Procedure: LEFT HEART CATH AND CORONARY ANGIOGRAPHY;  Surgeon: Troy Sine, MD;  Location: [place] CV LAB;  Service: Cardiovascular;  Laterality: N/A;   TONSILLECTOMY     UPPER GI ENDOSCOPY     growth removed from voice box   WRIST SURGERY Left    ganglion cyst removal   Family History  Problem Relation Age of Onset   Stroke Mother    Hypertension Mother    Hyperlipidemia Mother    Hypertension Father    Hyperlipidemia Father    Social History:  reports that she has been smoking cigarettes. She has a 2.50 pack-year smoking history. She has never  used smokeless tobacco. She reports that she does not drink alcohol and does not use drugs. Allergies  Allergen Reactions   Codeine Nausea And Vomiting   Adhesive [Tape] Other (See Comments)    Tape breaks out the skin if it is left on for a lengthy period of time   Prior to Admission medications   Medication Sig Start Date End Date Taking? Authorizing Provider  acetaminophen (TYLENOL) 500 MG tablet Take 1,000 mg by mouth every 6 (six) hours as needed for  mild pain or headache.   Yes [provider]  albuterol (PROVENTIL) (2.5 MG/3ML) 0.083% nebulizer solution Take 3 mLs (2.5 mg total) by nebulization every 6 (six) hours as needed for wheezing or shortness of breath. 03/30/21  Yes Newlin, Enobong, MD  albuterol (VENTOLIN HFA) 108 (90 Base) MCG/ACT inhaler Inhale 2 puffs into the lungs every 6 (six) hours as needed for wheezing or shortness of breath. 12/12/20  Yes Charlott Rakes, MD  aspirin 325 MG tablet Take 325 mg by mouth daily.   Yes [provider]  atorvastatin (LIPITOR) 40 MG tablet TAKE 1 TABLET BY MOUTH DAILY AT 6 PM. Patient taking differently: Take 40 mg by mouth daily at 6 PM. 05/23/20  Yes Newlin, Enobong, MD  B Complex-C-Zn-Folic Acid (DIALYVITE 364 WITH ZINC) 0.8 MG TABS Take 1 tablet by mouth at bedtime. 08/12/20  Yes [provider]  calcium acetate (PHOSLO) 667 MG capsule Take 1,334 mg by mouth in the morning, at noon, in the evening, and at bedtime. With meals and a snack   Yes [provider]  carvedilol (COREG) 12.5 MG tablet TAKE 1 TABLET (12.5 MG TOTAL) BY MOUTH 2 (TWO) TIMES DAILY WITH A MEAL. Patient taking differently: Take 12.5 mg by mouth 2 (two) times daily with a meal. 06/15/20 06/15/21 Yes Newlin, Enobong, MD  FLUoxetine (PROZAC) 20 MG capsule Take 1 capsule (20 mg total) by mouth daily. 12/12/20  Yes Charlott Rakes, MD  gabapentin (NEURONTIN) 100 MG capsule Take 1 capsule by mouth three times a day Patient taking differently: Take 100 mg by mouth at bedtime. 01/10/21  Yes   hydrOXYzine (ATARAX/VISTARIL) 25 MG tablet Take 1 tablet (25 mg total) by mouth 3 (three) times daily as needed. Patient taking differently: Take 25 mg by mouth 3 (three) times daily as needed for anxiety. 12/12/20  Yes Charlott Rakes, MD  lidocaine-prilocaine (EMLA) cream Apply 1 application topically See admin instructions. For use on dialysis days. 12/04/20  Yes [provider]  losartan (COZAAR) 50 MG tablet  Take 50 mg by mouth every evening.   Yes [provider]  midodrine (PROAMATINE) 10 MG tablet Take 1 tablet (10 mg total) by mouth every Monday, Wednesday, and Friday with hemodialysis. Patient taking differently: Take 10 mg by mouth See admin instructions. Monday ,Wednesday and Friday. As needed if blood pressure is low. 01/31/21  Yes Newlin, Enobong, MD  OXYGEN Inhale 3 L into the lungs continuous.   Yes [provider]  Tiotropium Bromide Monohydrate (SPIRIVA RESPIMAT) 2.5 MCG/ACT AERS Inhale 2 puffs into the lungs daily. 01/30/21  Yes Charlott Rakes, MD  bacitracin-polymyxin b (POLYSPORIN) ophthalmic ointment Place 1 application into the right eye every 12 (twelve) hours. apply to eye every 12 hours while awake Patient not taking: Reported on 04/04/2021 01/30/21   Charlott Rakes, MD  Blood Glucose Monitoring Suppl (BLOOD GLUCOSE METER) kit Use as instructed 11/01/13   Reyne Dumas, MD  Blood Glucose Monitoring Suppl (TRUE METRIX METER) DEVI 1  each by Does not apply route 3 (three) times daily before meals. 06/02/20   Charlott Rakes, MD  glucose blood (TRUETEST TEST) test strip Use as instructed 06/02/20   Charlott Rakes, MD  Lancets (FREESTYLE) lancets Use as instructed 11/01/13   Reyne Dumas, MD  metoCLOPramide (REGLAN) 5 MG tablet TAKE 1 TABLET BY MOUTH FOUR TIMES DAILY, BEFORE MEALS AND AT BEDTIME. Patient not taking: Reported on 04/04/2021 11/13/20 11/13/21  Willia Craze, NP  Misc. Devices MISC Large depends. Diagnosis - fecal incontinence 01/30/21   Charlott Rakes, MD  TRUEplus Lancets 28G MISC 1 each by Does not apply route 3 (three) times daily before meals. 06/02/20   Charlott Rakes, MD  amLODipine (NORVASC) 10 MG tablet Take 1 tablet (10 mg total) by mouth daily. 08/01/20 11/09/20  Terrilee Croak, MD  hydrALAZINE (APRESOLINE) 25 MG tablet Take 1 tablet (25 mg total) by mouth every 8 (eight) hours. 07/31/20 11/09/20  Terrilee Croak, MD  omeprazole (PRILOSEC) 20 MG capsule TAKE  1 CAPSULE (20 MG TOTAL) BY MOUTH 2 (TWO) TIMES DAILY BEFORE A MEAL. 08/01/20 08/22/20  Charlott Rakes, MD   Current Facility-Administered Medications  Medication Dose Route Frequency Provider Last Rate Last Admin   acetaminophen (TYLENOL) tablet 650 mg  650 mg Oral Q6H PRN Irene Pap N, DO   650 mg at 04/05/21 0035   albuterol (PROVENTIL) (2.5 MG/3ML) 0.083% nebulizer solution 2.5 mg  2.5 mg Nebulization Q6H PRN Darliss Cheney, MD       ascorbic acid (VITAMIN C) tablet 500 mg  500 mg Oral Daily Everett, Carole N, DO   500 mg at 04/05/21 1610   aspirin tablet 325 mg  325 mg Oral Daily Darliss Cheney, MD       atorvastatin (LIPITOR) tablet 40 mg  40 mg Oral q1800 Hall, Carole N, DO       carvedilol (COREG) tablet 12.5 mg  12.5 mg Oral BID WC Hall, Carole N, DO   12.5 mg at 04/05/21 0818   FLUoxetine (PROZAC) capsule 20 mg  20 mg Oral Daily Irene Pap N, DO   20 mg at 04/05/21 9604   gabapentin (NEURONTIN) capsule 100 mg  100 mg Oral QHS Irene Pap N, DO   100 mg at 04/05/21 0148   guaiFENesin-dextromethorphan (ROBITUSSIN DM) 100-10 MG/5ML syrup 10 mL  10 mL Oral Q4H PRN Irene Pap N, DO       heparin injection 5,000 Units  5,000 Units Subcutaneous Q8H Irene Pap N, DO   5,000 Units at 04/05/21 5409   hydrALAZINE (APRESOLINE) tablet 25 mg  25 mg Oral Q6H PRN Darliss Cheney, MD       Ipratropium-Albuterol (COMBIVENT) respimat 1 puff  1 puff Inhalation Q6H Irene Pap N, DO   1 puff at 04/05/21 1334   losartan (COZAAR) tablet 50 mg  50 mg Oral QPM Hall, Carole N, DO       melatonin tablet 3 mg  3 mg Oral QHS PRN Irene Pap N, DO       methylPREDNISolone sodium succinate (SOLU-MEDROL) 125 mg/2 mL injection 80 mg  80 mg Intravenous Q12H Irene Pap N, DO   80 mg at 04/05/21 8119   Followed by   Derrill Memo ON 04/08/2021] predniSONE (DELTASONE) tablet 50 mg  50 mg Oral Daily Irene Pap N, DO       multivitamin with minerals tablet 1 tablet  1 tablet Oral Daily Irene Pap N, DO   1 tablet at  04/05/21 0928   ondansetron (  ZOFRAN) injection 4 mg  4 mg Intravenous Q6H PRN Hall, Carole N, DO       piperacillin-tazobactam (ZOSYN) IVPB 2.25 g  2.25 g Intravenous Q8H Hall, Carole N, DO       polyethylene glycol (MIRALAX / GLYCOLAX) packet 17 g  17 g Oral Daily PRN Irene Pap N, DO       remdesivir 200 mg in sodium chloride 0.9% 250 mL IVPB  200 mg Intravenous Once Darliss Cheney, MD       Followed by   Derrill Memo ON 04/06/2021] remdesivir 100 mg in sodium chloride 0.9 % 100 mL IVPB  100 mg Intravenous Daily Darliss Cheney, MD       Derrill Memo ON 04/06/2021] umeclidinium bromide (INCRUSE ELLIPTA) 62.5 MCG/INH 1 puff  1 puff Inhalation Daily Hall, Carole N, DO       zinc sulfate capsule 220 mg  220 mg Oral Daily Irene Pap N, DO   220 mg at 04/05/21 1103   Labs: Basic Metabolic Panel: Recent Labs  Lab 04/04/21 2043 04/05/21 0339  NA 137 137  K 3.2* 3.2*  CL 98 99  CO2 28 28  GLUCOSE 139* 222*  BUN 15 20  CREATININE 3.19* 3.81*  CALCIUM 8.9 8.6*   Liver Function Tests: Recent Labs  Lab 04/04/21 2043 04/05/21 0339  AST 13* 12*  ALT 10 9  ALKPHOS 68 70  BILITOT 0.2* 0.2*  PROT 6.2* 6.1*  ALBUMIN 2.9* 2.8*   CBC: Recent Labs  Lab 04/04/21 2043 04/05/21 0339  WBC 5.0 4.0  NEUTROABS 3.3 3.4  HGB 9.9* 9.6*  HCT 34.1* 33.7*  MCV 92.2 93.9  PLT 211 185   Iron Studies:  Recent Labs    04/05/21 0339  FERRITIN 310*   Studies/Results: DG Chest Portable 1 View  Result Date: 04/04/2021 CLINICAL DATA:  Shortness of breath EXAM: PORTABLE CHEST 1 VIEW COMPARISON:  Radiograph 02/20/2021, chest CT 04/29/2009 FINDINGS: Enlarged cardiac silhouette. There is diffuse interstitial in bilateral lower lung airspace opacities. There are small bilateral pleural effusions with adjacent atelectasis. There is no visible pneumothorax. There is no acute osseous abnormality. IMPRESSION: Cardiomegaly with mild-to-moderate pulmonary edema and small bilateral pleural effusions with adjacent  atelectasis. Electronically Signed   By: Maurine Simmering   On: 04/04/2021 21:09    ROS: All others negative except those listed in HPI.   Physical Exam: Vitals:   04/05/21 1100 04/05/21 1150 04/05/21 1155 04/05/21 1329  BP: (!) 168/69     Pulse: 64 72  64  Resp: 16 16    Temp:   98.7 F (37.1 C)   TempSrc:   Oral   SpO2: 97% 97%  94%     General: chronically ill appearing female in NAD Head: NCAT sclera not icteric MMM Neck: Supple. No lymphadenopathy Lungs: mostly clear, faint crackles in lower lobes, nml WOB on HFNC Heart: RRR. No murmur, rubs or gallops.  Abdomen: soft, nontender, +BS, no guarding, no rebound tenderness M/S:  Equal strength b/l in upper and lower extremities.  Lower extremities:no edema, ischemic changes, or open wounds  Neuro: AAOx3. Moves all extremities spontaneously. Psych:  Responds to questions appropriately with a normal affect. Dialysis Access: LU AVF +b/t  Dialysis Orders:  MWF- [facility]  3hrs, BFR 400, DFR 500,  EDW 77kg, 3K/ 2.5Ca  Access: LU AVF  Heparin 5000 unit bolus Mircera 225 mcg q2wks - last 7/18 Venofer 155m qHD x10 (2 completed) Hectorol 179m IV qHD  Assessment/Plan:  COVID PNA- tested  positive on 7/27. Per PMD Acute on chronic respiratory failure 2/2 #1 & likely superimposed by bacterial PNA. On 4L O2 at home. per PMD  ESRD -  on HD MWF, will write orders for HD tomorrow per regular schedule.  Hypertension/volume  - Blood pressure elevated.  Usually takes midodrine 46m pre HD. Continue home meds. CXR with possible pulmonary edema, left HD 2L over edw, typically will meet, plan for goal 3L tomorrow.  Anemia of CKD - Hgb 9.6.  Will hold iron in setting of acute infection.  ESA due 8/1  Secondary Hyperparathyroidism -  Ca in goal. Check phos. Continue VDRA and phoslo 2 AC TID  Nutrition - Renal diet w/fluid restrictions  LJen Mow PA-C [employer]7/28/2022, 1:53 PM

## 2021-04-05 NOTE — Telephone Encounter (Signed)
FYI

## 2021-04-05 NOTE — ED Notes (Signed)
Pt has been on 6L, c/o of increased SOB, while waiting for inhaler to come from pharmacy, pt SOB became worse, oxygen dropped to 80% on 6L, resp called placing pt on heated high flow

## 2021-04-05 NOTE — Progress Notes (Signed)
PCT came back relatively low in ESRD (0.3). Ok to stop zosyn per Dr. Doristine Bosworth.   Onnie Boer, PharmD, BCIDP, AAHIVP, CPP Infectious Disease Pharmacist 04/05/2021 3:01 PM

## 2021-04-05 NOTE — Plan of Care (Signed)

## 2021-04-05 NOTE — Plan of Care (Signed)
  Problem: Education: Goal: Knowledge of General Education information will improve Description Including pain rating scale, medication(s)/side effects and non-pharmacologic comfort measures Outcome: Progressing   

## 2021-04-05 NOTE — Progress Notes (Signed)
PROGRESS NOTE    Tina Gomez  [id number] DOB: [birth year] DOA: 04/04/2021 PCP: Charlott Rakes, MD   Brief Narrative:  HPI: Tina Gomez is a 65 y.o. female with medical history significant for ESRD on HD MWF, COPD, chronic hypoxia on 4 L nasal cannula at baseline, hypertension, hyperlipidemia, anemia of chronic kidney disease, hyperlipidemia, chronic anxiety/depression, who presented to [facility] ED from home due to fever 102, cough, shortness of breath.  Onset on 04/03/2021, she took a COVID-19 screening test at home which returned negative.  She went to hemodialysis the following day with persistent symptoms, completed hemodialysis, returned home and took a COVID-19 test which returned positive.  EMS was called.  Upon EMS arrival oxygen saturation in the 80s on 4 L [place].  Oxygen supplementation increased to 6 L with O2 saturation in the low 90s.  She was brought into the ED for further evaluation.  Wheezing on exam, endorses feeling very weak and tired.  Chest x-ray revealing bilateral pulmonary opacities.  EDP contacted nephrology who recommended treatment of underlying conditions, multifocal pneumonia.  COVID-19 screening test positive on 04/04/2021.  Patient admitted to hospitalist team for acute on chronic hypoxic respiratory failure secondary to COVID-19 viral pneumonia likely superimposed by bacterial pneumonia.   ED Course:  T-max 99.9.  BP 194/84, pulse 75, respiration rate 15, O2 saturation 95% on 6 L.  Lab studies remarkable for serum sodium 137, potassium 3.2, BUN 15, creatinine 3.19.  WBC 5.0, hemoglobin 9.9, MCV 92, platelet 211.  Assessment & Plan:   Active Problems:   Type 2 diabetes mellitus with diabetic neuropathy, unspecified (HCC)   Essential hypertension, benign   Acute on chronic respiratory failure with hypoxia (HCC)   Acute respiratory disease due to COVID-19 virus   ESRD on hemodialysis (HCC)   Generalized weakness  Acute on chronic hypoxic respiratory failure possibly  secondary to COVID-19 viral pneumonia, likely superimposed by bacterial pneumonia, POA. At baseline she is on 4 L nasal cannula, currently requiring 20 L high flow oxygen. Started on IV Solu-Medrol but no antibiotics.  Will check procalcitonin and if significantly elevated, will initiate on antibiotics for possible bacterial pneumonia.  Will start on remdesivir.  Patient was encouraged to prone, out of bed to chair, to use incentive spirometry and flutter valve.  Follow inflammatory markers on daily basis.  The treatment plan and use of medications and known side effects were discussed with patient/family, they were clearly explained that there is no proven definitive treatment for COVID-19 infection, any medications used here are based on published clinical articles/anecdotal data which are not peer-reviewed or randomized control trials.  Complete risks and long-term side effects are unknown, however in the best clinical judgment they seem to be of some clinical benefit rather than medical risks.  Patient/family agree with the treatment plan and want to receive the given medications.  ESRD on HD MWF: Nephrology consulted.  Next dialysis tomorrow.   Hypertension, essential: Blood pressure slightly elevated.  Continue home medications and monitor closely.   Hypokalemia: 3.2 again.  Will defer to nephrology since she is on dialysis.   Anemia of chronic kidney disease: Hemoglobin is stable.  Generalized weakness: PT OT consulted.  DVT prophylaxis: heparin injection 5,000 Units Start: 04/04/21 2330 SCDs Start: 04/04/21 2317   Code Status: Full Code  Family Communication: None present at bedside.  Plan of care discussed with patient in length and he verbalized understanding and agreed with it.  Status is: Inpatient  Remains inpatient appropriate because:Hemodynamically  unstable  Dispo:  Patient From: Home  Planned Disposition: Home  Medically stable for discharge: No           Estimated body mass index is 31.53 kg/m as calculated from the following:   Height as of 01/30/21: '5\' 2"'$  (1.575 m).   Weight as of 01/30/21: 78.2 kg.     Nutritional Assessment: There is no height or weight on file to calculate BMI.. Seen by dietician.  I agree with the assessment and plan as outlined below: Nutrition Status:        .  Skin Assessment: I have examined the patient's skin and I agree with the wound assessment as performed by the wound care RN as outlined below:    Consultants:  None  Procedures:  None  Antimicrobials:  Anti-infectives (From admission, onward)    Start     Dose/Rate Route Frequency Ordered Stop   04/06/21 1000  remdesivir 100 mg in sodium chloride 0.9 % 100 mL IVPB       See Hyperspace for full Linked Orders Report.   100 mg 200 mL/hr over 30 Minutes Intravenous Daily 04/05/21 0847 04/10/21 0959   04/05/21 1400  piperacillin-tazobactam (ZOSYN) IVPB 2.25 g       Note to Pharmacy: Zosyn 2.25 g IV q8h in ESRD   2.25 g 100 mL/hr over 30 Minutes Intravenous Every 8 hours 04/05/21 0122     04/05/21 0900  remdesivir 200 mg in sodium chloride 0.9% 250 mL IVPB       See Hyperspace for full Linked Orders Report.   200 mg 580 mL/hr over 30 Minutes Intravenous Once 04/05/21 0847     04/05/21 0200  piperacillin-tazobactam (ZOSYN) IVPB 3.375 g        3.375 g 100 mL/hr over 30 Minutes Intravenous  Once 04/05/21 0122 04/05/21 0357          Subjective: Seen and examined.  She was eating breakfast.  Feeling better than yesterday, less shortness of breath.  Still on high amount of oxygen.  No other complaint.  Objective: Vitals:   04/05/21 0842 04/05/21 1100 04/05/21 1150 04/05/21 1155  BP:  (!) 168/69    Pulse: 69 64 72   Resp: (!) '24 16 16   '$ Temp:    98.7 F (37.1 C)  TempSrc:    Oral  SpO2: 92% 97% 97%     Intake/Output Summary (Last 24 hours) at 04/05/2021 1246 Last data filed at 04/05/2021 0357 Gross per 24 hour  Intake  100 ml  Output --  Net 100 ml   There were no vitals filed for this visit.  Examination:  General exam: Appears calm and comfortable but older than her stated age Respiratory system: Rhonchi bilaterally, respiratory effort normal. Cardiovascular system: S1 & S2 heard, RRR. No JVD, murmurs, rubs, gallops or clicks. No pedal edema. Gastrointestinal system: Abdomen is nondistended, soft and nontender. No organomegaly or masses felt. Normal bowel sounds heard. Central nervous system: Alert and oriented. No focal neurological deficits. Extremities: Symmetric 5 x 5 power. Skin: No rashes, lesions or ulcers Psychiatry: Judgement and insight appear normal. Mood & affect appropriate.    Data Reviewed: I have personally reviewed following labs and imaging studies  CBC: Recent Labs  Lab 04/04/21 2043 04/05/21 0339  WBC 5.0 4.0  NEUTROABS 3.3 3.4  HGB 9.9* 9.6*  HCT 34.1* 33.7*  MCV 92.2 93.9  PLT 211 [id number]   Basic Metabolic Panel: Recent Labs  Lab 04/04/21 2043 04/05/21 0339  NA 137 137  K 3.2* 3.2*  CL 98 99  CO2 28 28  GLUCOSE 139* 222*  BUN 15 20  CREATININE 3.19* 3.81*  CALCIUM 8.9 8.6*   GFR: CrCl cannot be calculated (Unknown ideal weight.). Liver Function Tests: Recent Labs  Lab 04/04/21 2043 04/05/21 0339  AST 13* 12*  ALT 10 9  ALKPHOS 68 70  BILITOT 0.2* 0.2*  PROT 6.2* 6.1*  ALBUMIN 2.9* 2.8*   No results for input(s): LIPASE, AMYLASE in the last 168 hours. No results for input(s): AMMONIA in the last 168 hours. Coagulation Profile: No results for input(s): INR, PROTIME in the last 168 hours. Cardiac Enzymes: No results for input(s): CKTOTAL, CKMB, CKMBINDEX, TROPONINI in the last 168 hours. BNP (last 3 results) No results for input(s): PROBNP in the last 8760 hours. HbA1C: No results for input(s): HGBA1C in the last 72 hours. CBG: No results for input(s): GLUCAP in the last 168 hours. Lipid Profile: No results for input(s): CHOL, HDL, LDLCALC,  TRIG, CHOLHDL, LDLDIRECT in the last 72 hours. Thyroid Function Tests: No results for input(s): TSH, T4TOTAL, FREET4, T3FREE, THYROIDAB in the last 72 hours. Anemia Panel: Recent Labs    04/05/21 0339  FERRITIN 310*   Sepsis Labs: No results for input(s): PROCALCITON, LATICACIDVEN in the last 168 hours.  Recent Results (from the past 240 hour(s))  Resp Panel by RT-PCR (Flu A&B, Covid) Nasopharyngeal Swab     Status: Abnormal   Collection Time: 04/04/21  8:55 PM   Specimen: Nasopharyngeal Swab; Nasopharyngeal(NP) swabs in vial transport medium  Result Value Ref Range Status   SARS Coronavirus 2 by RT PCR POSITIVE (A) NEGATIVE Final    Comment: RESULT CALLED TO, READ BACK BY AND VERIFIED WITH: SEEARNARS RN, AT 2334 04/04/21 D. VANHOOK (NOTE) SARS-CoV-2 target nucleic acids are DETECTED.  The SARS-CoV-2 RNA is generally detectable in upper respiratory specimens during the acute phase of infection. Positive results are indicative of the presence of the identified virus, but do not rule out bacterial infection or co-infection with other pathogens not detected by the test. Clinical correlation with patient history and other diagnostic information is necessary to determine patient infection status. The expected result is Negative.  Fact Sheet for Patients: EntrepreneurPulse.com.au  Fact Sheet for Healthcare Providers: IncredibleEmployment.be  This test is not yet approved or cleared by the [place] FDA and  has been authorized for detection and/or diagnosis of SARS-CoV-2 by FDA under an Emergency Use Authorization (EUA).  This EUA will remain in effect (meaning this test  can be used) for the duration of  the COVID-19 declaration under Section 564(b)(1) of the Act, 21 U.S.C. section 360bbb-3(b)(1), unless the authorization is terminated or revoked sooner.     Influenza A by PCR NEGATIVE NEGATIVE Final   Influenza B by PCR NEGATIVE  NEGATIVE Final    Comment: (NOTE) The Xpert Xpress SARS-CoV-2/FLU/RSV plus assay is intended as an aid in the diagnosis of influenza from Nasopharyngeal swab specimens and should not be used as a sole basis for treatment. Nasal washings and aspirates are unacceptable for Xpert Xpress SARS-CoV-2/FLU/RSV testing.  Fact Sheet for Patients: EntrepreneurPulse.com.au  Fact Sheet for Healthcare Providers: IncredibleEmployment.be  This test is not yet approved or cleared by the [place] FDA and has been authorized for detection and/or diagnosis of SARS-CoV-2 by FDA under an Emergency Use Authorization (EUA). This EUA will remain in effect (meaning this test can be used) for the duration of the COVID-19 declaration under Section 564(b)(1)  of the Act, 21 U.S.C. section 360bbb-3(b)(1), unless the authorization is terminated or revoked.  Performed at [facility], [place] [address], [place], [place] [address]       Radiology Studies: DG Chest Portable 1 View  Result Date: 04/04/2021 CLINICAL DATA:  Shortness of breath EXAM: PORTABLE CHEST 1 VIEW COMPARISON:  Radiograph 02/20/2021, chest CT 04/29/2009 FINDINGS: Enlarged cardiac silhouette. There is diffuse interstitial in bilateral lower lung airspace opacities. There are small bilateral pleural effusions with adjacent atelectasis. There is no visible pneumothorax. There is no acute osseous abnormality. IMPRESSION: Cardiomegaly with mild-to-moderate pulmonary edema and small bilateral pleural effusions with adjacent atelectasis. Electronically Signed   By: Maurine Simmering   On: 04/04/2021 21:09    Scheduled Meds:  vitamin C  500 mg Oral Daily   atorvastatin  40 mg Oral q1800   carvedilol  12.5 mg Oral BID WC   FLUoxetine  20 mg Oral Daily   gabapentin  100 mg Oral QHS   heparin  5,000 Units Subcutaneous Q8H   Ipratropium-Albuterol  1 puff Inhalation Q6H   losartan  50 mg Oral QPM    methylPREDNISolone (SOLU-MEDROL) injection  80 mg Intravenous Q12H   Followed by   Derrill Memo ON 04/08/2021] predniSONE  50 mg Oral Daily   multivitamin with minerals  1 tablet Oral Daily   zinc sulfate  220 mg Oral Daily   Continuous Infusions:  piperacillin-tazobactam (ZOSYN)  IV     remdesivir 200 mg in sodium chloride 0.9% 250 mL IVPB     Followed by   Derrill Memo ON 04/06/2021] remdesivir 100 mg in NS 100 mL       LOS: 1 day   Time spent: 37 minutes   Darliss Cheney, MD [facility]  04/05/2021, 12:46 PM   How to contact the [facility] Attending or Consulting provider [place] or covering provider during after hours [place], for this patient?  Check the care team in [facility] and look for a) attending/consulting TR[facility] provider listed and b) the [facility] team listed. Page or secure chat [phone]. Log into [email or web address] and use [place]'s universal password to access. If you do not have the password, please contact the hospital operator. Locate the [facility] provider you are looking for under [facility] and page to a number that you can be directly reached. If you still have difficulty reaching the provider, please page the [facility] (Director on Call) for the Hospitalists listed on amion for assistance.

## 2021-04-06 DIAGNOSIS — J9621 Acute and chronic respiratory failure with hypoxia: Secondary | ICD-10-CM | POA: Diagnosis not present

## 2021-04-06 LAB — COMPREHENSIVE METABOLIC PANEL
ALT: 11 U/L (ref 0–44)
AST: 17 U/L (ref 15–41)
Albumin: 3.1 g/dL — ABNORMAL LOW (ref 3.5–5.0)
Alkaline Phosphatase: 66 U/L (ref 38–126)
Anion gap: 14 (ref 5–15)
BUN: 47 mg/dL — ABNORMAL HIGH (ref 8–23)
CO2: 27 mmol/L (ref 22–32)
Calcium: 8.5 mg/dL — ABNORMAL LOW (ref 8.9–10.3)
Chloride: 96 mmol/L — ABNORMAL LOW (ref 98–111)
Creatinine, Ser: 5.04 mg/dL — ABNORMAL HIGH (ref 0.44–1.00)
GFR, Estimated: 9 mL/min — ABNORMAL LOW (ref >60)
Glucose, Bld: 222 mg/dL — ABNORMAL HIGH (ref 70–99)
Potassium: 3.5 mmol/L (ref 3.5–5.1)
Sodium: 137 mmol/L (ref 135–145)
Total Bilirubin: 0.7 mg/dL (ref 0.3–1.2)
Total Protein: 6.5 g/dL (ref 6.5–8.1)

## 2021-04-06 LAB — CBC WITH DIFFERENTIAL/PLATELET
Abs Immature Granulocytes: 0.09 10*3/uL — ABNORMAL HIGH (ref 0.00–0.07)
Basophils Absolute: 0 10*3/uL (ref 0.0–0.1)
Basophils Relative: 0 %
Eosinophils Absolute: 0 10*3/uL (ref 0.0–0.5)
Eosinophils Relative: 0 %
HCT: 36 % (ref 36.0–46.0)
Hemoglobin: 10.5 g/dL — ABNORMAL LOW (ref 12.0–15.0)
Immature Granulocytes: 1 %
Lymphocytes Relative: 7 %
Lymphs Abs: 0.6 10*3/uL — ABNORMAL LOW (ref 0.7–4.0)
MCH: 26.8 pg (ref 26.0–34.0)
MCHC: 29.2 g/dL — ABNORMAL LOW (ref 30.0–36.0)
MCV: 91.8 fL (ref 80.0–100.0)
Monocytes Absolute: 0.3 10*3/uL (ref 0.1–1.0)
Monocytes Relative: 4 %
Neutro Abs: 7.2 10*3/uL (ref 1.7–7.7)
Neutrophils Relative %: 88 %
Platelets: 217 10*3/uL (ref 150–400)
RBC: 3.92 MIL/uL (ref 3.87–5.11)
RDW: 18.2 % — ABNORMAL HIGH (ref 11.5–15.5)
WBC: 8.2 10*3/uL (ref 4.0–10.5)
nRBC: 0 % (ref 0.0–0.2)

## 2021-04-06 LAB — GLUCOSE, CAPILLARY
Glucose-Capillary: 219 mg/dL — ABNORMAL HIGH (ref 70–99)
Glucose-Capillary: 324 mg/dL — ABNORMAL HIGH (ref 70–99)
Glucose-Capillary: 246 mg/dL — ABNORMAL HIGH (ref 70–99)
Glucose-Capillary: 179 mg/dL — ABNORMAL HIGH (ref 70–99)

## 2021-04-06 LAB — FERRITIN: Ferritin: 609 ng/mL — ABNORMAL HIGH (ref 11–307)

## 2021-04-06 LAB — D-DIMER, QUANTITATIVE: D-Dimer, Quant: 1.5 ug/mL-FEU — ABNORMAL HIGH (ref 0.00–0.50)

## 2021-04-06 LAB — HEMOGLOBIN A1C
Hgb A1c MFr Bld: 6.2 % — ABNORMAL HIGH (ref 4.8–5.6)
Mean Plasma Glucose: 131.24 mg/dL

## 2021-04-06 LAB — C-REACTIVE PROTEIN: CRP: 2.6 mg/dL — ABNORMAL HIGH (ref <1.0)

## 2021-04-06 MED ORDER — INSULIN ASPART 100 UNIT/ML IJ SOLN
0.0000 [IU] | Freq: Every day | INTRAMUSCULAR | Status: DC
  Administered 2021-04-07: 4 [IU] via SUBCUTANEOUS
  Administered 2021-04-08: 3 [IU] via SUBCUTANEOUS
  Administered 2021-04-09: 2 [IU] via SUBCUTANEOUS
  Administered 2021-04-10: 4 [IU] via SUBCUTANEOUS
  Administered 2021-04-11: 3 [IU] via SUBCUTANEOUS
  Administered 2021-04-12: 5 [IU] via SUBCUTANEOUS
  Administered 2021-04-13: 2 [IU] via SUBCUTANEOUS

## 2021-04-06 MED ORDER — LABETALOL HCL 5 MG/ML IV SOLN
10.0000 mg | INTRAVENOUS | Status: DC | PRN
  Administered 2021-04-06 – 2021-04-13 (×9): 10 mg via INTRAVENOUS
  Filled 2021-04-06 (×11): qty 4

## 2021-04-06 MED ORDER — INSULIN ASPART 100 UNIT/ML IJ SOLN
0.0000 [IU] | Freq: Three times a day (TID) | INTRAMUSCULAR | Status: DC
  Administered 2021-04-06: 7 [IU] via SUBCUTANEOUS
  Administered 2021-04-06: 3 [IU] via SUBCUTANEOUS
  Administered 2021-04-07: 2 [IU] via SUBCUTANEOUS

## 2021-04-06 NOTE — Progress Notes (Signed)
[facility] Progress Note  Subjective: seen in room, has not had HD yet today, no sig SOb  Vitals:   04/06/21 1430 04/06/21 1435 04/06/21 1540 04/06/21 1616  BP: (!) 190/83 (!) 199/79 (!) 191/87 (!) 190/90  Pulse: 60  (!) 58 62  Resp: (!) '27 18 15 20  '$ Temp:      TempSrc:      SpO2: 96%  93% 92%    Exam: General: chronically ill appearing female in NAD Head: NCAT sclera not icteric MMM Neck: Supple. No lymphadenopathy Lungs: mostly clear, faint crackles in lower lobes, nml WOB on HFNC Heart: RRR. No murmur, rubs or gallops. Abdomen: soft, nontender, +BS, no guarding, no rebound tenderness M/S:  Equal strength b/l in upper and lower extremities. Lower extremities:no edema, ischemic changes, or open wounds Neuro: AAOx3. Moves all extremities spontaneously. Psych:  Responds to questions appropriately with a normal affect. Dialysis Access: LU AVF +b/t   Dialysis:  MWF [facility]  3h  400/500  77kg  3K/2.5Ca bath  LUA AVF  Hep 5000  Mircera 225 mcg q2wks - last 7/18 Venofer '100mg'$  qHD x10 (2 completed) Hectorol 63mg IV qHD   Assessment/Plan:  COVID PNA- tested positive on 7/27. Per PMD Acute on chronic respiratory failure - 2/2 #1 & likely superimposed by bacterial PNA and possibly pulm edema. On 4L O2 at home. per PMD  ESRD -  on HD MWFSat. For HD today. per regular schedule. Pt states she takes only 3h HD 4 days per week. Will plan 3 hr HD tomorrow as well.   Hypertension/volume  - Blood pressure elevated.  Usually takes midodrine '10mg'$  pre HD. Continue home meds. CXR with possible pulmonary edema, left HD 2L over edw, typically will meet, plan for goal 3L today and tomorrow.   Anemia of CKD - Hgb 9.6.  Will hold iron in setting of acute infection.  ESA due 8/1  Secondary Hyperparathyroidism -  Ca in goal. Check phos. Continue VDRA and phoslo 2 AC TID  Nutrition - Renal diet w/fluid restrictions     Rob Montez Cuda 04/06/2021, 4:27 PM   Recent Labs  Lab  04/05/21 0339 04/06/21 0308  K 3.2* 3.5  BUN 20 47*  CREATININE 3.81* 5.04*  CALCIUM 8.6* 8.5*  HGB 9.6* 10.5*   Inpatient medications:  vitamin C  500 mg Oral Daily   aspirin  325 mg Oral Daily   atorvastatin  40 mg Oral q1800   calcium acetate  1,334 mg Oral TID WC   carvedilol  12.5 mg Oral BID WC   Chlorhexidine Gluconate Cloth  6 each Topical Q0600   FLUoxetine  20 mg Oral Daily   gabapentin  100 mg Oral QHS   heparin  5,000 Units Subcutaneous Q8H   insulin aspart  0-5 Units Subcutaneous QHS   insulin aspart  0-9 Units Subcutaneous TID WC   Ipratropium-Albuterol  1 puff Inhalation Q6H   losartan  50 mg Oral QPM   methylPREDNISolone (SOLU-MEDROL) injection  80 mg Intravenous Q12H   Followed by   [Derrill MemoON 04/08/2021] predniSONE  50 mg Oral Daily   multivitamin with minerals  1 tablet Oral Daily   umeclidinium bromide  1 puff Inhalation Daily   zinc sulfate  220 mg Oral Daily    remdesivir 200 mg in sodium chloride 0.9% 250 mL IVPB     Followed by   remdesivir 100 mg in NS 100 mL     acetaminophen, albuterol, guaiFENesin-dextromethorphan, hydrALAZINE, labetalol, melatonin,  ondansetron (ZOFRAN) IV, polyethylene glycol

## 2021-04-06 NOTE — Evaluation (Signed)
Physical Therapy Evaluation Patient Details Name: Tina Gomez MRN: [id number] DOB: May 09, 1956 Today's Date: 04/06/2021   History of Present Illness  65 y.o. female presents to [facility] ED on 04/04/2021 with fever, cough, SOB. Pt found to be COVID+. Chest x-ray revealing bilateral pulmonary opacities. Past medical history significant for ESRD on HD MWF, COPD, chronic hypoxia on 4 L nasal cannula at baseline, hypertension, hyperlipidemia, anemia of chronic kidney disease, hyperlipidemia, chronic anxiety/depression.  Clinical Impression  Pt presents to PT with deficits in activity tolerance, strength, power, balance, and gait. Pt ambulation is limited by being on heated high flow during session. Pt is able to ambulate without need for significant physical assistance, but does demonstrate mild instability and reduced gait speed without UE support. Pt will benefit from continued aggressive mobilization and acute PT services to improve activity tolerance, reduce falls risk, and aide in restoring the pts PLOF.    Follow Up Recommendations Home health PT;Supervision - Intermittent    Equipment Recommendations  None recommended by PT    Recommendations for Other Services       Precautions / Restrictions Precautions Precautions: Fall;Other (comment) Precaution Comments: monitor O2, COVID Restrictions Weight Bearing Restrictions: No      Mobility  Bed Mobility               General bed mobility comments: not assessed, sitting at edge of bed on arrival and left in same position    Transfers Overall transfer level: Needs assistance Equipment used: None Transfers: Sit to/from Stand Sit to Stand: Supervision         General transfer comment: Supervision for sit to stand from bedside, no assist needed but pt reaching to [facility] armrests in front of bed for stability. Able to march in place with handheld assist and no desats.  Ambulation/Gait Ambulation/Gait assistance: Min  guard;Supervision Gait Distance (Feet): 20 Feet (additional trial of 40') Assistive device: 1 person hand held assist;None Gait Pattern/deviations: Step-to pattern Gait velocity: reduced Gait velocity interpretation: <1.8 ft/sec, indicate of risk for recurrent falls General Gait Details: pt with short step-to gait, high guard, initial trial with hand hold, 2nd trial without UE support. Gait distances limited due to pt being on heated high flow oxygen  Stairs            Wheelchair Mobility    Modified Rankin (Stroke Patients Only)       Balance Overall balance assessment: Needs assistance Sitting-balance support: No upper extremity supported;Feet supported Sitting balance-Leahy Scale: Good     Standing balance support: No upper extremity supported Standing balance-Leahy Scale: Fair Standing balance comment: appears reliant on at least one UE support with dynamic standing tasks                             Pertinent Vitals/Pain Pain Assessment: No/denies pain    Home Living Family/patient expects to be discharged to:: Private residence Living Arrangements: Children Available Help at Discharge: Family;Available 24 hours/day Type of Home: House Home Access: Ramped entrance     Home Layout: Two level;Able to live on main level with bedroom/bathroom Home Equipment: Shower seat;Grab bars - tub/shower;Bedside commode;Grab bars - toilet;Walker - 4 wheels;Wheelchair - manual;Hand held shower head      Prior Function Level of Independence: Needs assistance   Gait / Transfers Assistance Needed: able to mobilize without AD in home normally with occasional furniture walking. Uses Rollator out in community or wheelchair if long distance mobility  required (at [facility] center recently or on difficult dialysis days)  ADL's / Homemaking Assistance Needed: Typically Independent with ADLs, daughter assists with shower transfers and provides supervision for safety. Will do  basic IADLs like making sandwich.  Comments: daughter very supportive and safety aware     Hand Dominance   Dominant Hand: Right    Extremity/Trunk Assessment   Upper Extremity Assessment Upper Extremity Assessment: Overall WFL for tasks assessed    Lower Extremity Assessment Lower Extremity Assessment: Generalized weakness    Cervical / Trunk Assessment Cervical / Trunk Assessment: Normal  Communication   Communication: No difficulties  Cognition Arousal/Alertness: Awake/alert Behavior During Therapy: WFL for tasks assessed/performed Overall Cognitive Status: Within Functional Limits for tasks assessed                                        General Comments General comments (skin integrity, edema, etc.): pt on 20L heated high flow, sats ranging from 92-100% with activity.    Exercises     Assessment/Plan    PT Assessment Patient needs continued PT services  PT Problem List Decreased strength;Decreased activity tolerance;Decreased balance;Decreased mobility;Cardiopulmonary status limiting activity       PT Treatment Interventions DME instruction;Gait training;Functional mobility training;Therapeutic activities;Therapeutic exercise;Balance training;Patient/family education    PT Goals (Current goals can be found in the Care Plan section)  Acute Rehab PT Goals Patient Stated Goal: to reduce oxygen needs and go home PT Goal Formulation: With patient Time For Goal Achievement: 04/20/21 Potential to Achieve Goals: Good Additional Goals Additional Goal #1: Pt will ambulate for >150' reporting a DOE of 3/10 or less to indicate improved activity tolerance    Frequency Min 3X/week   Barriers to discharge        Co-evaluation               AM-PAC PT "6 Clicks" Mobility  Outcome Measure Help needed turning from your back to your side while in a flat bed without using bedrails?: None Help needed moving from lying on your back to sitting on the  side of a flat bed without using bedrails?: None Help needed moving to and from a bed to a chair (including a wheelchair)?: A Little Help needed standing up from a chair using your arms (e.g., wheelchair or bedside chair)?: A Little Help needed to walk in hospital room?: A Little Help needed climbing 3-5 steps with a railing? : A Little 6 Click Score: 20    End of Session Equipment Utilized During Treatment: Oxygen Activity Tolerance: Patient tolerated treatment well Patient left: in bed;with call bell/phone within reach Nurse Communication: Mobility status PT Visit Diagnosis: Other abnormalities of gait and mobility (R26.89);Muscle weakness (generalized) (M62.81)    Time: [id number] PT Time Calculation (min) (ACUTE ONLY): 18 min   Charges:   PT Evaluation $PT Eval Low Complexity: 1 Low          Zenaida Niece, PT, DPT Acute Rehabilitation Pager: [phone]   Zenaida Niece 04/06/2021, 11:28 AM

## 2021-04-06 NOTE — Evaluation (Signed)
Occupational Therapy Evaluation Patient Details Name: Tina Gomez MRN: [id number] DOB: Apr 27, 1956 Today's Date: 04/06/2021    History of Present Illness 65 y.o. female presents to [facility] ED on 04/04/2021 with fever, cough, SOB. Pt found to be COVID+. Chest x-ray revealing bilateral pulmonary opacities. Past medical history significant for ESRD on HD MWF, COPD, chronic hypoxia on 4 L nasal cannula at baseline, hypertension, hyperlipidemia, anemia of chronic kidney disease, hyperlipidemia, chronic anxiety/depression.   Clinical Impression   PTA, pt lives with daughter and reports Independence with ADLs and mobility inside of the home. Pt uses Rollator typically for community mobility. Pt received sitting EOB on 20 L HHFC, 60% FiO2. Pt overall Supervision at most for transfers and basic ADLs though limited in further assessment due to [facility] setup. Sats remained >96% throughout session. Anticipate pt to progress well with no OT needs at DC. Will continue to follow acutely to maximize endurance, progress strength with HEP training and wean O2 within tolerance during ADLs.     Follow Up Recommendations  No OT follow up    Equipment Recommendations  None recommended by OT    Recommendations for Other Services       Precautions / Restrictions Precautions Precautions: Fall;Other (comment) Precaution Comments: monitor O2 Restrictions Weight Bearing Restrictions: No      Mobility Bed Mobility               General bed mobility comments: sitting EOB on entry    Transfers Overall transfer level: Needs assistance Equipment used: None Transfers: Sit to/from Stand Sit to Stand: Supervision         General transfer comment: Supervision for sit to stand from bedside, no assist needed but pt reaching to [facility] armrests in front of bed for stability. Able to march in place with handheld assist and no desats.    Balance Overall balance assessment: Needs assistance Sitting-balance support:  No upper extremity supported;Feet supported Sitting balance-Leahy Scale: Good     Standing balance support: No upper extremity supported;During functional activity Standing balance-Leahy Scale: Poor Standing balance comment: appears reliant on at least one UE support with dynamic standing tasks                           ADL either performed or assessed with clinical judgement   ADL Overall ADL's : Needs assistance/impaired Eating/Feeding: Independent;Sitting   Grooming: Independent;Sitting   Upper Body Bathing: Set up;Sitting   Lower Body Bathing: Sit to/from stand;Supervison/ safety   Upper Body Dressing : Set up;Sitting   Lower Body Dressing: Sit to/from stand;Supervision/safety   Toilet Transfer: Supervision/safety;Stand-pivot;BSC Toilet Transfer Details (indicate cue type and reason): has been transferring to [facility] without assist though appears reliant on at least one UE support in standing with OT Toileting- Clothing Manipulation and Hygiene: Supervision/safety;Sit to/from stand         General ADL Comments: Pt received on HHFNC with SpO2 sats WFL, min unsteadiness in standing though good safety awareness.     Vision Patient Visual Report: No change from baseline Vision Assessment?: No apparent visual deficits     Perception     Praxis      Pertinent Vitals/Pain Pain Assessment: No/denies pain     Hand Dominance Right   Extremity/Trunk Assessment Upper Extremity Assessment Upper Extremity Assessment: Overall WFL for tasks assessed   Lower Extremity Assessment Lower Extremity Assessment: Defer to PT evaluation   Cervical / Trunk Assessment Cervical / Trunk Assessment: Normal  Communication Communication Communication: No difficulties   Cognition Arousal/Alertness: Awake/alert Behavior During Therapy: WFL for tasks assessed/performed Overall Cognitive Status: Within Functional Limits for tasks assessed                                      General Comments  SpO2 96% and above on 20 L HHFNC, 60% FiO2    Exercises     Shoulder Instructions      Home Living Family/patient expects to be discharged to:: Private residence Living Arrangements: Children Available Help at Discharge: Family;Available 24 hours/day Type of Home: House Home Access: Ramped entrance     Home Layout: Two level;Able to live on main level with bedroom/bathroom Alternate Level Stairs-Number of Steps: has "mother in law suite" which is handicapped equipped, does not need to go up stairs inside   [employer] Shower/Tub: Occupational psychologist: Handicapped height ([place] over toilet)     Home Equipment: Shower seat;Grab bars - tub/shower;Bedside commode;Grab bars - toilet;Walker - 4 wheels;Wheelchair - manual;Hand held shower head          Prior Functioning/Environment Level of Independence: Needs assistance  Gait / Transfers Assistance Needed: able to mobilize without AD in home normally with occasional furniture walking. Uses Rollator out in community or wheelchair if long distance mobility required (at [facility] recently or on difficult dialysis days) ADL's / Homemaking Assistance Needed: Typically Independent with ADLs, daughter assists with shower transfers and provides supervision for safety. Will do basic IADLs like making sandwich.   Comments: daughter very supportive and safety aware        OT Problem List: Decreased activity tolerance;Decreased strength;Impaired balance (sitting and/or standing);Cardiopulmonary status limiting activity      OT Treatment/Interventions: Self-care/ADL training;Therapeutic exercise;Energy conservation;DME and/or AE instruction;Therapeutic activities;Balance training;Patient/family education    OT Goals(Current goals can be found in the care plan section) Acute Rehab OT Goals Patient Stated Goal: fix thermostat, wean o2 OT Goal Formulation: With patient Time For Goal Achievement:  04/20/21 Potential to Achieve Goals: Good ADL Goals Pt Will Transfer to Toilet: with modified independence;ambulating Pt/caregiver will Perform Home Exercise Program: Increased strength;Both right and left upper extremity;With theraband;Independently;With written HEP provided Additional ADL Goal #1: Pt to increase standing activity tolerance > 5-7 min during daily tasks to maximize endurance  OT Frequency: Min 2X/week   Barriers to D/C:            Co-evaluation              AM-PAC OT "6 Clicks" Daily Activity     Outcome Measure Help from another person eating meals?: None Help from another person taking care of personal grooming?: A Little Help from another person toileting, which includes using toliet, bedpan, or urinal?: A Little Help from another person bathing (including washing, rinsing, drying)?: A Little Help from another person to put on and taking off regular upper body clothing?: A Little Help from another person to put on and taking off regular lower body clothing?: A Little 6 Click Score: 19   End of Session Equipment Utilized During Treatment: Oxygen Nurse Communication: Mobility status;Other (comment) (O2, thermostat broken)  Activity Tolerance: Patient tolerated treatment well Patient left: in bed;with call bell/phone within reach  OT Visit Diagnosis: Unsteadiness on feet (R26.81);Muscle weakness (generalized) (M62.81)                Time: [id number] OT Time Calculation (min):  25 min Charges:  OT General Charges $OT Visit: 1 Visit OT Evaluation $OT Eval Moderate Complexity: 1 Mod OT Treatments $Therapeutic Activity: 8-22 mins  Malachy Chamber, OTR/L Acute Rehab Services Office: [phone]   Layla Maw 04/06/2021, 11:08 AM

## 2021-04-06 NOTE — Progress Notes (Signed)
Attempted to call dialysis x 4 about when patient is supposed to go to dialysis, no answer.

## 2021-04-06 NOTE — Progress Notes (Signed)
PROGRESS NOTE    Tina Gomez  [id number] DOB: 25-Aug-1956 DOA: 04/04/2021 PCP: Charlott Rakes, MD   Brief Narrative:  Tina Gomez is a 65 y.o. female with medical history significant for type 2 diabetes mellitus ESRD on HD MWF, COPD, chronic hypoxia on 4 L nasal cannula at baseline, hypertension, hyperlipidemia, anemia of chronic kidney disease, hyperlipidemia, chronic anxiety/depression, who presented to [facility] ED from home due to fever 102, cough, shortness of breath.  Onset on 04/03/2021, she took a COVID-19 screening test at home which returned negative.  She went to hemodialysis the following day with persistent symptoms, completed hemodialysis, returned home and took a COVID-19 test which returned positive.  EMS was called.  Upon EMS arrival oxygen saturation in the 80s on 4 L [place]. She was brought into the ED for further evaluation. Chest x-ray revealing bilateral pulmonary opacities, multifocal pneumonia.  COVID-19 screening test positive on 04/04/2021.  Patient admitted to hospitalist team for acute on chronic hypoxic respiratory failure secondary to COVID-19 viral pneumonia.  Assessment & Plan:   Active Problems:   Type 2 diabetes mellitus with diabetic neuropathy, unspecified (HCC)   Essential hypertension, benign   Acute on chronic respiratory failure with hypoxia (HCC)   Acute respiratory disease due to COVID-19 virus   ESRD on hemodialysis (HCC)   Generalized weakness  Acute on chronic hypoxic respiratory failure possibly secondary to COVID-19 viral pneumonia,POA: At baseline she is on 4 L nasal cannula, currently requiring 20 L high flow oxygen. Procalcitonin only 0.30, doubt superimposed bacterial infection.  Continue Solu-Medrol and remdesivir.  CRP improved. Follow inflammatory markers on daily basis.  Patient was encouraged to prone, out of bed to chair, to use incentive spirometry and flutter valve.  She looks very comfortable.  Nursing advised to wean oxygen.  Hopefully  dialysis will help as well.  The treatment plan and use of medications and known side effects were discussed with patient/family, they were clearly explained that there is no proven definitive treatment for COVID-19 infection, any medications used here are based on published clinical articles/anecdotal data which are not peer-reviewed or randomized control trials.  Complete risks and long-term side effects are unknown, however in the best clinical judgment they seem to be of some clinical benefit rather than medical risks.  Patient/family agree with the treatment plan and want to receive the given medications.  ESRD on HD MWF: Nephrology consulted.  Dialysis today.   Hypertension, essential: Blood pressure slightly elevated.  Continue home medications and monitor closely.  Hopefully this will get better after dialysis today.   Hypokalemia: Resolved.   Anemia of chronic kidney disease: Hemoglobin is stable.  Generalized weakness: PT OT consulted.  Hyperlipidemia: Continue statin.  Type 2 diabetes mellitus: Not on any home medications.  Recent hemoglobin A1c in March was 6.4.  Now hyperglycemic, likely due to steroids.  We will check hemoglobin A1c and start on SSI for now.  Might need to add long-acting insulin.  DVT prophylaxis: heparin injection 5,000 Units Start: 04/04/21 2330 SCDs Start: 04/04/21 2317   Code Status: Full Code  Family Communication: None present at bedside.  Plan of care discussed with patient in length and he verbalized understanding and agreed with it.  Status is: Inpatient  Remains inpatient appropriate because:Hemodynamically unstable  Dispo:  Patient From: Home  Planned Disposition: Home  Medically stable for discharge: No      Estimated body mass index is 31.53 kg/m as calculated from the following:   Height as of  01/30/21: '5\' 2"'$  (1.575 m).   Weight as of 01/30/21: 78.2 kg.   Consultants:  Nephrology  Procedures:  None  Antimicrobials:   Anti-infectives (From admission, onward)    Start     Dose/Rate Route Frequency Ordered Stop   04/06/21 1000  remdesivir 100 mg in sodium chloride 0.9 % 100 mL IVPB       See Hyperspace for full Linked Orders Report.   100 mg 200 mL/hr over 30 Minutes Intravenous Daily 04/05/21 0847 04/10/21 0959   04/05/21 1400  piperacillin-tazobactam (ZOSYN) IVPB 2.25 g  Status:  Discontinued       Note to Pharmacy: Zosyn 2.25 g IV q8h in ESRD   2.25 g 100 mL/hr over 30 Minutes Intravenous Every 8 hours 04/05/21 0122 04/05/21 1500   04/05/21 0900  remdesivir 200 mg in sodium chloride 0.9% 250 mL IVPB       See Hyperspace for full Linked Orders Report.   200 mg 580 mL/hr over 30 Minutes Intravenous Once 04/05/21 0847     04/05/21 0200  piperacillin-tazobactam (ZOSYN) IVPB 3.375 g        3.375 g 100 mL/hr over 30 Minutes Intravenous  Once 04/05/21 0122 04/05/21 0357          Subjective: Seen and examined.  She was eating breakfast.  Feeling better than yesterday, less shortness of breath.  Still on high amount of oxygen.  No other complaint.  Objective: Vitals:   04/05/21 2105 04/06/21 0215 04/06/21 0524 04/06/21 0800  BP:   (!) 175/63 (!) 163/76  Pulse:   61 61  Resp:   (!) 21 20  Temp:   98.9 F (37.2 C)   TempSrc:   Oral   SpO2: 95% 95% 100% 97%    Intake/Output Summary (Last 24 hours) at 04/06/2021 1018 Last data filed at 04/06/2021 0600 Gross per 24 hour  Intake 120 ml  Output 0 ml  Net 120 ml    There were no vitals filed for this visit.  Examination:  General exam: Appears calm and comfortable  Respiratory system: Diminished breath sounds bilaterally, respiratory effort normal. Cardiovascular system: S1 & S2 heard, RRR. No JVD, murmurs, rubs, gallops or clicks. No pedal edema. Gastrointestinal system: Abdomen is nondistended, soft and nontender. No organomegaly or masses felt. Normal bowel sounds heard. Central nervous system: Alert and oriented. No focal neurological  deficits. Extremities: Symmetric 5 x 5 power. Skin: No rashes, lesions or ulcers.  Psychiatry: Judgement and insight appear normal. Mood & affect appropriate.   Data Reviewed: I have personally reviewed following labs and imaging studies  CBC: Recent Labs  Lab 04/04/21 2043 04/05/21 0339 04/06/21 0308  WBC 5.0 4.0 8.2  NEUTROABS 3.3 3.4 7.2  HGB 9.9* 9.6* 10.5*  HCT 34.1* 33.7* 36.0  MCV 92.2 93.9 91.8  PLT 211 185 [id number]    Basic Metabolic Panel: Recent Labs  Lab 04/04/21 2043 04/05/21 0339 04/06/21 0308  NA 137 137 137  K 3.2* 3.2* 3.5  CL 98 99 96*  CO2 '28 28 27  '$ GLUCOSE 139* 222* 222*  BUN 15 20 47*  CREATININE 3.19* 3.81* 5.04*  CALCIUM 8.9 8.6* 8.5*    GFR: CrCl cannot be calculated (Unknown ideal weight.). Liver Function Tests: Recent Labs  Lab 04/04/21 2043 04/05/21 0339 04/06/21 0308  AST 13* 12* 17  ALT '10 9 11  '$ ALKPHOS 68 70 66  BILITOT 0.2* 0.2* 0.7  PROT 6.2* 6.1* 6.5  ALBUMIN 2.9* 2.8* 3.1*  No results for input(s): LIPASE, AMYLASE in the last 168 hours. No results for input(s): AMMONIA in the last 168 hours. Coagulation Profile: No results for input(s): INR, PROTIME in the last 168 hours. Cardiac Enzymes: No results for input(s): CKTOTAL, CKMB, CKMBINDEX, TROPONINI in the last 168 hours. BNP (last 3 results) No results for input(s): PROBNP in the last 8760 hours. HbA1C: No results for input(s): HGBA1C in the last 72 hours. CBG: Recent Labs  Lab 04/06/21 0827  GLUCAP 219*   Lipid Profile: No results for input(s): CHOL, HDL, LDLCALC, TRIG, CHOLHDL, LDLDIRECT in the last 72 hours. Thyroid Function Tests: No results for input(s): TSH, T4TOTAL, FREET4, T3FREE, THYROIDAB in the last 72 hours. Anemia Panel: Recent Labs    04/05/21 0339 04/06/21 0308  FERRITIN 310* 609*    Sepsis Labs: Recent Labs  Lab 04/05/21 0339  PROCALCITON 0.30    Recent Results (from the past 240 hour(s))  Resp Panel by RT-PCR (Flu A&B, Covid)  Nasopharyngeal Swab     Status: Abnormal   Collection Time: 04/04/21  8:55 PM   Specimen: Nasopharyngeal Swab; Nasopharyngeal(NP) swabs in vial transport medium  Result Value Ref Range Status   SARS Coronavirus 2 by RT PCR POSITIVE (A) NEGATIVE Final    Comment: RESULT CALLED TO, READ BACK BY AND VERIFIED WITH: SEEARNARS RN, AT 2334 04/04/21 D. VANHOOK (NOTE) SARS-CoV-2 target nucleic acids are DETECTED.  The SARS-CoV-2 RNA is generally detectable in upper respiratory specimens during the acute phase of infection. Positive results are indicative of the presence of the identified virus, but do not rule out bacterial infection or co-infection with other pathogens not detected by the test. Clinical correlation with patient history and other diagnostic information is necessary to determine patient infection status. The expected result is Negative.  Fact Sheet for Patients: EntrepreneurPulse.com.au  Fact Sheet for Healthcare Providers: IncredibleEmployment.be  This test is not yet approved or cleared by the [place] FDA and  has been authorized for detection and/or diagnosis of SARS-CoV-2 by FDA under an Emergency Use Authorization (EUA).  This EUA will remain in effect (meaning this test  can be used) for the duration of  the COVID-19 declaration under Section 564(b)(1) of the Act, 21 U.S.C. section 360bbb-3(b)(1), unless the authorization is terminated or revoked sooner.     Influenza A by PCR NEGATIVE NEGATIVE Final   Influenza B by PCR NEGATIVE NEGATIVE Final    Comment: (NOTE) The Xpert Xpress SARS-CoV-2/FLU/RSV plus assay is intended as an aid in the diagnosis of influenza from Nasopharyngeal swab specimens and should not be used as a sole basis for treatment. Nasal washings and aspirates are unacceptable for Xpert Xpress SARS-CoV-2/FLU/RSV testing.  Fact Sheet for Patients: EntrepreneurPulse.com.au  Fact Sheet for  Healthcare Providers: IncredibleEmployment.be  This test is not yet approved or cleared by the [place] FDA and has been authorized for detection and/or diagnosis of SARS-CoV-2 by FDA under an Emergency Use Authorization (EUA). This EUA will remain in effect (meaning this test can be used) for the duration of the COVID-19 declaration under Section 564(b)(1) of the Act, 21 U.S.C. section 360bbb-3(b)(1), unless the authorization is terminated or revoked.  Performed at [facility], [place] [address], [place], [place] [address]        Radiology Studies: DG Chest Portable 1 View  Result Date: 04/04/2021 CLINICAL DATA:  Shortness of breath EXAM: PORTABLE CHEST 1 VIEW COMPARISON:  Radiograph 02/20/2021, chest CT 04/29/2009 FINDINGS: Enlarged cardiac silhouette. There is diffuse interstitial in  bilateral lower lung airspace opacities. There are small bilateral pleural effusions with adjacent atelectasis. There is no visible pneumothorax. There is no acute osseous abnormality. IMPRESSION: Cardiomegaly with mild-to-moderate pulmonary edema and small bilateral pleural effusions with adjacent atelectasis. Electronically Signed   By: Maurine Simmering   On: 04/04/2021 21:09    Scheduled Meds:  vitamin C  500 mg Oral Daily   aspirin  325 mg Oral Daily   atorvastatin  40 mg Oral q1800   calcium acetate  1,334 mg Oral TID WC   carvedilol  12.5 mg Oral BID WC   Chlorhexidine Gluconate Cloth  6 each Topical Q0600   FLUoxetine  20 mg Oral Daily   gabapentin  100 mg Oral QHS   heparin  5,000 Units Subcutaneous Q8H   insulin aspart  0-5 Units Subcutaneous QHS   insulin aspart  0-9 Units Subcutaneous TID WC   Ipratropium-Albuterol  1 puff Inhalation Q6H   losartan  50 mg Oral QPM   methylPREDNISolone (SOLU-MEDROL) injection  80 mg Intravenous Q12H   Followed by   Derrill Memo ON 04/08/2021] predniSONE  50 mg Oral Daily   multivitamin with minerals  1 tablet Oral Daily   umeclidinium  bromide  1 puff Inhalation Daily   zinc sulfate  220 mg Oral Daily   Continuous Infusions:  remdesivir 200 mg in sodium chloride 0.9% 250 mL IVPB     Followed by   remdesivir 100 mg in NS 100 mL       LOS: 2 days   Time spent: 30 minutes   Darliss Cheney, MD [facility]  04/06/2021, 10:18 AM   How to contact the [facility] Attending or Consulting provider [place] or covering provider during after hours [place], for this patient?  Check the care team in [facility] and look for a) attending/consulting TR[facility] provider listed and b) the [facility] team listed. Page or secure chat [phone]. Log into [email or web address] and use [place]'s universal password to access. If you do not have the password, please contact the hospital operator. Locate the [facility] provider you are looking for under [facility] and page to a number that you can be directly reached. If you still have difficulty reaching the provider, please page the [facility] (Director on Call) for the Hospitalists listed on amion for assistance.

## 2021-04-06 NOTE — Progress Notes (Signed)
Inpatient Diabetes Program Recommendations  AACE/ADA: New Consensus Statement on Inpatient Glycemic Control (2015)  Target Ranges:  Prepandial:   less than 140 mg/dL      Peak postprandial:   less than 180 mg/dL (1-2 hours)      Critically ill patients:  140 - 180 mg/dL   Lab Results  Component Value Date   GLUCAP 324 (H) 04/06/2021   HGBA1C 6.4 (H) 11/08/2020    Review of Glycemic Control Results for Tina Gomez, Tina Gomez (MRN [id number]) as of 04/06/2021 13:03  Ref. Range 04/06/2021 08:27 04/06/2021 11:28  Glucose-Capillary Latest Ref Range: 70 - 99 mg/dL 219 (H) 324 (H)   Diabetes history: DM 2 Outpatient Diabetes medications: Diet controlled Current orders for Inpatient glycemic control:  Novolog 0-9 units tid + hs  A1c 6.4% on 11/08/20 Solumedrol 80 mg Q12 PO prednisone 50 mg Daily starting on 7/31  Inpatient Diabetes Program Recommendations:    -  Add Levemir 8 units -  Add Novolog 2 units tid meal coverage if eating >50%  Thanks,  Tama Headings RN, MSN, BC-ADM Inpatient Diabetes Coordinator Team Pager [phone] (8a-5p)

## 2021-04-07 ENCOUNTER — Inpatient Hospital Stay (HOSPITAL_COMMUNITY): Payer: Medicare Other

## 2021-04-07 DIAGNOSIS — J9621 Acute and chronic respiratory failure with hypoxia: Secondary | ICD-10-CM

## 2021-04-07 LAB — COMPREHENSIVE METABOLIC PANEL
ALT: 13 U/L (ref 0–44)
AST: 16 U/L (ref 15–41)
Albumin: 2.9 g/dL — ABNORMAL LOW (ref 3.5–5.0)
Alkaline Phosphatase: 67 U/L (ref 38–126)
Anion gap: 15 (ref 5–15)
BUN: 50 mg/dL — ABNORMAL HIGH (ref 8–23)
CO2: 26 mmol/L (ref 22–32)
Calcium: 8.3 mg/dL — ABNORMAL LOW (ref 8.9–10.3)
Chloride: 95 mmol/L — ABNORMAL LOW (ref 98–111)
Creatinine, Ser: 4.88 mg/dL — ABNORMAL HIGH (ref 0.44–1.00)
GFR, Estimated: 9 mL/min — ABNORMAL LOW (ref >60)
Glucose, Bld: 246 mg/dL — ABNORMAL HIGH (ref 70–99)
Potassium: 4.1 mmol/L (ref 3.5–5.1)
Sodium: 136 mmol/L (ref 135–145)
Total Bilirubin: 0.2 mg/dL — ABNORMAL LOW (ref 0.3–1.2)
Total Protein: 6.1 g/dL — ABNORMAL LOW (ref 6.5–8.1)

## 2021-04-07 LAB — CBC WITH DIFFERENTIAL/PLATELET
Abs Immature Granulocytes: 0.17 10*3/uL — ABNORMAL HIGH (ref 0.00–0.07)
Basophils Absolute: 0 10*3/uL (ref 0.0–0.1)
Basophils Relative: 0 %
Eosinophils Absolute: 0 10*3/uL (ref 0.0–0.5)
Eosinophils Relative: 0 %
HCT: 34 % — ABNORMAL LOW (ref 36.0–46.0)
Hemoglobin: 9.8 g/dL — ABNORMAL LOW (ref 12.0–15.0)
Immature Granulocytes: 2 %
Lymphocytes Relative: 5 %
Lymphs Abs: 0.5 10*3/uL — ABNORMAL LOW (ref 0.7–4.0)
MCH: 26.7 pg (ref 26.0–34.0)
MCHC: 28.8 g/dL — ABNORMAL LOW (ref 30.0–36.0)
MCV: 92.6 fL (ref 80.0–100.0)
Monocytes Absolute: 0.4 10*3/uL (ref 0.1–1.0)
Monocytes Relative: 4 %
Neutro Abs: 9.4 10*3/uL — ABNORMAL HIGH (ref 1.7–7.7)
Neutrophils Relative %: 89 %
Platelets: 219 10*3/uL (ref 150–400)
RBC: 3.67 MIL/uL — ABNORMAL LOW (ref 3.87–5.11)
RDW: 18.3 % — ABNORMAL HIGH (ref 11.5–15.5)
WBC: 10.4 10*3/uL (ref 4.0–10.5)
nRBC: 0 % (ref 0.0–0.2)

## 2021-04-07 LAB — GLUCOSE, CAPILLARY
Glucose-Capillary: 200 mg/dL — ABNORMAL HIGH (ref 70–99)
Glucose-Capillary: 220 mg/dL — ABNORMAL HIGH (ref 70–99)
Glucose-Capillary: 307 mg/dL — ABNORMAL HIGH (ref 70–99)

## 2021-04-07 LAB — RENAL FUNCTION PANEL
Albumin: 2.9 g/dL — ABNORMAL LOW (ref 3.5–5.0)
Anion gap: 16 — ABNORMAL HIGH (ref 5–15)
BUN: 63 mg/dL — ABNORMAL HIGH (ref 8–23)
CO2: 23 mmol/L (ref 22–32)
Calcium: 8.3 mg/dL — ABNORMAL LOW (ref 8.9–10.3)
Chloride: 97 mmol/L — ABNORMAL LOW (ref 98–111)
Creatinine, Ser: 5.14 mg/dL — ABNORMAL HIGH (ref 0.44–1.00)
GFR, Estimated: 9 mL/min — ABNORMAL LOW (ref >60)
Glucose, Bld: 155 mg/dL — ABNORMAL HIGH (ref 70–99)
Phosphorus: 8.1 mg/dL — ABNORMAL HIGH (ref 2.5–4.6)
Potassium: 3.7 mmol/L (ref 3.5–5.1)
Sodium: 136 mmol/L (ref 135–145)

## 2021-04-07 LAB — CBC
HCT: 33.7 % — ABNORMAL LOW (ref 36.0–46.0)
Hemoglobin: 10 g/dL — ABNORMAL LOW (ref 12.0–15.0)
MCH: 27.5 pg (ref 26.0–34.0)
MCHC: 29.7 g/dL — ABNORMAL LOW (ref 30.0–36.0)
MCV: 92.8 fL (ref 80.0–100.0)
Platelets: 210 10*3/uL (ref 150–400)
RBC: 3.63 MIL/uL — ABNORMAL LOW (ref 3.87–5.11)
RDW: 18.4 % — ABNORMAL HIGH (ref 11.5–15.5)
WBC: 7.3 10*3/uL (ref 4.0–10.5)
nRBC: 0 % (ref 0.0–0.2)

## 2021-04-07 LAB — C-REACTIVE PROTEIN: CRP: 1.5 mg/dL — ABNORMAL HIGH (ref <1.0)

## 2021-04-07 LAB — FERRITIN: Ferritin: 494 ng/mL — ABNORMAL HIGH (ref 11–307)

## 2021-04-07 LAB — D-DIMER, QUANTITATIVE: D-Dimer, Quant: 1.13 ug/mL-FEU — ABNORMAL HIGH (ref 0.00–0.50)

## 2021-04-07 MED ORDER — BUDESONIDE 0.25 MG/2ML IN SUSP: 0.2500 mg | Freq: Two times a day (BID) | RESPIRATORY_TRACT | Status: DC

## 2021-04-07 MED ORDER — HEPARIN SODIUM (PORCINE) 1000 UNIT/ML DIALYSIS
1000.0000 [IU] | INTRAMUSCULAR | Status: DC | PRN
  Filled 2021-04-07: qty 1

## 2021-04-07 MED ORDER — IPRATROPIUM-ALBUTEROL 20-100 MCG/ACT IN AERS
1.0000 | INHALATION_SPRAY | Freq: Four times a day (QID) | RESPIRATORY_TRACT | Status: DC
  Administered 2021-04-07 – 2021-04-08 (×5): 1 via RESPIRATORY_TRACT
  Filled 2021-04-07: qty 4

## 2021-04-07 MED ORDER — IPRATROPIUM-ALBUTEROL 20-100 MCG/ACT IN AERS
1.0000 | INHALATION_SPRAY | Freq: Four times a day (QID) | RESPIRATORY_TRACT | Status: DC
  Filled 2021-04-07: qty 4

## 2021-04-07 MED ORDER — SODIUM CHLORIDE 0.9 % IV SOLN: 100.0000 mL | INTRAVENOUS | Status: DC | PRN

## 2021-04-07 MED ORDER — HEPARIN SODIUM (PORCINE) 1000 UNIT/ML DIALYSIS
5000.0000 [IU] | INTRAMUSCULAR | Status: DC | PRN
  Filled 2021-04-07: qty 5

## 2021-04-07 MED ORDER — REVEFENACIN 175 MCG/3ML IN SOLN
175.0000 ug | Freq: Every day | RESPIRATORY_TRACT | Status: DC
  Filled 2021-04-07: qty 3

## 2021-04-07 MED ORDER — ALBUTEROL SULFATE HFA 108 (90 BASE) MCG/ACT IN AERS
2.0000 | INHALATION_SPRAY | Freq: Four times a day (QID) | RESPIRATORY_TRACT | Status: DC | PRN
  Filled 2021-04-07: qty 6.7

## 2021-04-07 MED ORDER — ALTEPLASE 2 MG IJ SOLR: 2.0000 mg | Freq: Once | INTRAMUSCULAR | Status: DC | PRN

## 2021-04-07 MED ORDER — ARFORMOTEROL TARTRATE 15 MCG/2ML IN NEBU: 15.0000 ug | INHALATION_SOLUTION | Freq: Two times a day (BID) | RESPIRATORY_TRACT | Status: DC

## 2021-04-07 MED ORDER — HEPARIN SODIUM (PORCINE) 1000 UNIT/ML DIALYSIS
5000.0000 [IU] | Freq: Once | INTRAMUSCULAR | Status: DC
  Filled 2021-04-07: qty 5

## 2021-04-07 MED ORDER — MIDODRINE HCL 5 MG PO TABS
10.0000 mg | ORAL_TABLET | Freq: Three times a day (TID) | ORAL | Status: DC
  Administered 2021-04-07 – 2021-04-09 (×4): 10 mg via ORAL
  Filled 2021-04-07 (×4): qty 2

## 2021-04-07 MED ORDER — LIDOCAINE-PRILOCAINE 2.5-2.5 % EX CREA
1.0000 "application " | TOPICAL_CREAM | CUTANEOUS | Status: DC | PRN
  Filled 2021-04-07: qty 5

## 2021-04-07 MED ORDER — IPRATROPIUM-ALBUTEROL 0.5-2.5 (3) MG/3ML IN SOLN: 3.0000 mL | Freq: Four times a day (QID) | RESPIRATORY_TRACT | Status: DC

## 2021-04-07 MED ORDER — PENTAFLUOROPROP-TETRAFLUOROETH EX AERO: 1.0000 "application " | INHALATION_SPRAY | CUTANEOUS | Status: DC | PRN

## 2021-04-07 MED ORDER — INSULIN ASPART 100 UNIT/ML IJ SOLN
0.0000 [IU] | Freq: Three times a day (TID) | INTRAMUSCULAR | Status: DC
  Administered 2021-04-07 – 2021-04-08 (×3): 7 [IU] via SUBCUTANEOUS
  Administered 2021-04-08: 11 [IU] via SUBCUTANEOUS
  Administered 2021-04-09 (×3): 3 [IU] via SUBCUTANEOUS
  Administered 2021-04-10 – 2021-04-11 (×3): 4 [IU] via SUBCUTANEOUS
  Administered 2021-04-11: 20 [IU] via SUBCUTANEOUS
  Administered 2021-04-12: 11 [IU] via SUBCUTANEOUS
  Administered 2021-04-12 (×2): 7 [IU] via SUBCUTANEOUS
  Administered 2021-04-13: 5 [IU] via SUBCUTANEOUS
  Administered 2021-04-13: 4 [IU] via SUBCUTANEOUS
  Administered 2021-04-14: 7 [IU] via SUBCUTANEOUS
  Administered 2021-04-14: 4 [IU] via SUBCUTANEOUS
  Administered 2021-04-15: 7 [IU] via SUBCUTANEOUS

## 2021-04-07 MED ORDER — LIDOCAINE HCL (PF) 1 % IJ SOLN: 5.0000 mL | INTRAMUSCULAR | Status: DC | PRN

## 2021-04-07 NOTE — Progress Notes (Signed)
HD tx not achieved as expected due to pt complaining of not being able to breath, saying that something is not right, MD must called and requesting to go back to her room. O2 infusing at 30 L/m, unable to tolerate HD  tx , discontinued, and  pt transferred back to her room for further evaluation.  MD notified.

## 2021-04-07 NOTE — Consult Note (Signed)
NAME:  Tina Gomez, MRN:  [id number], DOB:  [birth year], LOS: 3 ADMISSION DATE:  04/04/2021, CONSULTATION DATE:  7/30 REFERRING MD:  singh, CHIEF COMPLAINT:  acute respiratory failure    History of Present Illness:  This is a 65 year old female patient with history as provided below.  Admitted 7/27 with fever, and shortness of breath, was positive for COVID.  Admitted to the internal medicine service with working diagnosis of COVID-pneumonia +/- volume overload.  Placed on IV systemic steroids and remdesivir.  Continued to require significant supplemental oxygen, due to logistics her last dialysis treatment was 7/27 (day of admit), attempted dialysis on 7/29 was acutely hypertensive, started to feel flush and short of breath demanded she be removed from dialysis stating something was wrong. 7/30: Critical care asked to evaluate given need for high flow oxygen, and logistics of providing this support in the dialysis suite Pertinent  Medical History  Chronic hypoxic respiratory failure, prior tobacco abuse, stopped smoking 2 months prior to admission, COPD.  End-stage renal disease.  M/W/F/S 3 hours schedule as typically gets hypotensive if has extended dialysis therapy Chronic hypotension on midodrine prior to dialysis Hyperparathyroidism, anemia of chronic disease.  Diabetes.  Hyperlipidemia.  Hypertension.  Prior stroke.  Restless leg syndrome.  Significant Hospital Events: Including procedures, antibiotic start and stop dates in addition to other pertinent events   7/27: Admitted, COVID-positive.  Had received dialysis outpatient setting.  Started on Solu-Medrol and remdesivir 7/27 through 7/29: Receiving treatment for COVID, this included remdesivir and Solu-Medrol as well as supplemental oxygen requiring high flow oxygen 20 to 30 L, 50%.  On 7/29 first attempt at dialysis.  Patient became hypertensive, diaphoretic, demanding dialysis be discontinued was treated with labetalol and seem to  improve 7/30 critical care asked to evaluate.  There was concern about need for high flow, also clinical decompensation in dialysis suite nephrology team asking to dialyze in the intensive care.  Interim History / Subjective:  Currently sitting up in bed eating breakfast no distress but does have work of breathing Objective   Blood pressure (Abnormal) 138/95, pulse 61, temperature (Abnormal) 97.5 F (36.4 C), temperature source Oral, resp. rate 20, height 5' 3"  (1.6 m), weight 78.7 kg, SpO2 98 %.    FiO2 (%):  [50 %] 50 %   Intake/Output Summary (Last 24 hours) at 04/07/2021 0919 Last data filed at 04/06/2021 2352 Gross per 24 hour  Intake no documentation  Output 501 ml  Net -501 ml   Filed Weights   04/07/21 0804  Weight: 78.7 kg    Examination: General: Chronically ill-appearing white female sitting upright currently on high flow oxygen HENT: Normocephalic atraumatic mucous membranes moist high flow nasal cannula in place Lungs: Basilar rales, currently some accessory use noted.  Expiratory wheezing.  Some scattered rhonchi.  Currently high flow oxygen Cardiovascular: Regular rate and rhythm without murmur rub or gallop Abdomen: Soft not tender Extremities: Warm dry Neuro: Awake oriented GU: Anuric  Resolved Hospital Problem list     Assessment & Plan:  Acute on chronic hypoxic respiratory failure in the setting of bilateral pulmonary infiltrate/COVID pneumonia, further complicated by pulmonary edema/volume overload superimposed on history of tobacco abuse and COPD -Portable chest x-ray personally reviewed shows diffuse bilateral infiltrates with decreased aeration particularly on the left when comparing prior film Plan Agree with transfer to intensive care Continue supplemental high flow oxygen Volume removal with dialysis, if does not tolerate IHD may need to consider CRRT Day #4 systemic steroids and  IV remdesivir for COVID Continue scheduled bronchodilators Continue  pulse oximetry A.m. chest x-ray  End-stage renal disease.  Complicated history with her dialysis tolerance.  Can only tolerate a 3-hour therapy, and therefore is typically on a Monday, Wednesday, Friday, Saturday schedule.  She has missed 1 scheduled treatment -The patient abruptly developed acute distress shortly after getting started on dialysis on 7/29 described feeling flush, hot, diaphoretic.  Said "I felt like I was going to die".  Was significantly hypertensive per review of chart.  Not clear to me that this was due to dialysis, seems more likely due to hypertension Plan Moving to intensive care for dialysis today If intermittent dialysis not tolerated, then we will need to consider CRRT Needs volume removal Supportive care A.m. chemistries Additional recs per nephrology  Complicated history of hypertension at baseline, and dialysis induced hypotension Plan Continuing scheduled Coreg, Cozaar and as needed's as needed  Anemia of chronic kidney disease Plan Trend CBC transfuse per protocol  Hyperlipidemia Plan Continue statin  Type 2 diabetes.  Steroid-induced hyperglycemia Plan Will advance to resistant scale    Best Practice (right click and "Reselect all SmartList Selections" daily)   Diet/type: Regular consistency (see orders) DVT prophylaxis: prophylactic heparin  GI prophylaxis: N/A Lines: N/A Foley:  N/A Code Status:  full code Last date of multidisciplinary goals of care discussion [pending ]  Labs   CBC: Recent Labs  Lab 04/04/21 2043 04/05/21 0339 04/06/21 0308 04/07/21 0221  WBC 5.0 4.0 8.2 10.4  NEUTROABS 3.3 3.4 7.2 9.4*  HGB 9.9* 9.6* 10.5* 9.8*  HCT 34.1* 33.7* 36.0 34.0*  MCV 92.2 93.9 91.8 92.6  PLT 211 185 217 459    Basic Metabolic Panel: Recent Labs  Lab 04/04/21 2043 04/05/21 0339 04/06/21 0308 04/07/21 0221  NA 137 137 137 136  K 3.2* 3.2* 3.5 4.1  CL 98 99 96* 95*  CO2 28 28 27 26   GLUCOSE 139* 222* 222* 246*  BUN 15 20  47* 50*  CREATININE 3.19* 3.81* 5.04* 4.88*  CALCIUM 8.9 8.6* 8.5* 8.3*   GFR: Estimated Creatinine Clearance: 11.6 mL/min (A) (by C-G formula based on SCr of 4.88 mg/dL (H)). Recent Labs  Lab 04/04/21 2043 04/05/21 0339 04/06/21 0308 04/07/21 0221  PROCALCITON  --  0.30  --   --   WBC 5.0 4.0 8.2 10.4    Liver Function Tests: Recent Labs  Lab 04/04/21 2043 04/05/21 0339 04/06/21 0308 04/07/21 0221  AST 13* 12* 17 16  ALT 10 9 11 13   ALKPHOS 68 70 66 67  BILITOT 0.2* 0.2* 0.7 0.2*  PROT 6.2* 6.1* 6.5 6.1*  ALBUMIN 2.9* 2.8* 3.1* 2.9*   No results for input(s): LIPASE, AMYLASE in the last 168 hours. No results for input(s): AMMONIA in the last 168 hours.  ABG    Component Value Date/Time   HCO3 26.1 05/25/2020 0107   TCO2 24 02/20/2021 0523   O2SAT 99.0 05/25/2020 0107     Coagulation Profile: No results for input(s): INR, PROTIME in the last 168 hours.  Cardiac Enzymes: No results for input(s): CKTOTAL, CKMB, CKMBINDEX, TROPONINI in the last 168 hours.  HbA1C: HbA1c, POC (controlled diabetic range)  Date/Time Value Ref Range Status  05/23/2020 04:13 PM 7.6 (A) 0.0 - 7.0 % Final  03/10/2018 09:08 AM 9.6 (A) 0.0 - 7.0 % Final   Hgb A1c MFr Bld  Date/Time Value Ref Range Status  04/06/2021 03:08 AM 6.2 (H) 4.8 - 5.6 % Final  Comment:    (NOTE) Pre diabetes:          5.7%-6.4%  Diabetes:              >6.4%  Glycemic control for   <7.0% adults with diabetes   11/08/2020 04:42 AM 6.4 (H) 4.8 - 5.6 % Final    Comment:    (NOTE) Pre diabetes:          5.7%-6.4%  Diabetes:              >6.4%  Glycemic control for   <7.0% adults with diabetes     CBG: Recent Labs  Lab 04/06/21 0827 04/06/21 1128 04/06/21 1726 04/06/21 2034 04/07/21 0619  GLUCAP 219* 324* 246* 179* 200*    Review of Systems:   Review of Systems  Constitutional:  Positive for malaise/fatigue. Negative for chills and fever.  HENT: Negative.    Eyes: Negative.    Respiratory:  Positive for shortness of breath and wheezing.   Cardiovascular: Negative.   Gastrointestinal: Negative.   Genitourinary: Negative.   Musculoskeletal: Negative.   Skin: Negative.   Neurological: Negative.   Endo/Heme/Allergies: Negative.   Psychiatric/Behavioral: Negative.      Past Medical History:  She,  has a past medical history of Anemia, Chronic kidney disease, COPD (chronic obstructive pulmonary disease) ([place]) (05/2019), Diabetes mellitus without complication ([place]), History of blood transfusion (09/2020), HLD (hyperlipidemia), Hypertension, PONV (postoperative nausea and vomiting), Restless legs syndrome (RLS), and Stroke ([place]).   Surgical History:   Past Surgical History:  Procedure Laterality Date   AV FISTULA PLACEMENT Left 07/27/2020   Procedure: LEFT BRACHIO-BASILIC ARTERIOVENOUS (AV) FISTULA CREATION;  Surgeon: Cherre Robins, MD;  Location: [facility];  Service: Vascular;  Laterality: Left;   [place] Left 11/02/2020   Procedure: LEFT UPPER ARM SECOND STAGE BASILIC VEIN TRANSPOSITION;  Surgeon: Cherre Robins, MD;  Location: [facility];  Service: Vascular;  Laterality: Left;  PERIPHERAL NERVE BLOCK   CESAREAN SECTION     x 1   ESOPHAGOGASTRODUODENOSCOPY (EGD) WITH PROPOFOL N/A 05/26/2020   Procedure: ESOPHAGOGASTRODUODENOSCOPY (EGD) WITH PROPOFOL;  Surgeon: Irene Shipper, MD;  Location: [facility];  Service: Endoscopy;  Laterality: N/A;   INSERTION OF DIALYSIS CATHETER Right 07/27/2020   Procedure: INSERTION OF RIGHT INTERNAL JUGULAR TUNNELED DIALYSIS CATHETER;  Surgeon: Cherre Robins, MD;  Location: [facility];  Service: Vascular;  Laterality: Right;   IR FLUORO GUIDE CV LINE RIGHT  07/25/2020   IR US GUIDE VASC ACCESS RIGHT  07/25/2020   LEFT HEART CATH AND CORONARY ANGIOGRAPHY N/A 11/28/2020   Procedure: LEFT HEART CATH AND CORONARY ANGIOGRAPHY;  Surgeon: Troy Sine, MD;  Location: [facility];  Service: Cardiovascular;   Laterality: N/A;   TONSILLECTOMY     UPPER GI ENDOSCOPY     growth removed from voice box   WRIST SURGERY Left    ganglion cyst removal     Social History:   reports that she has been smoking cigarettes. She has a 2.50 pack-year smoking history. She has never used smokeless tobacco. She reports that she does not drink alcohol and does not use drugs.   Family History:  Her family history includes Hyperlipidemia in her father and mother; Hypertension in her father and mother; Stroke in her mother.   Allergies Allergies  Allergen Reactions   Codeine Nausea And Vomiting   Adhesive [Tape] Other (See Comments)    Tape breaks out the skin if it is left on  for a lengthy period of time     Home Medications  Prior to Admission medications   Medication Sig Start Date End Date Taking? Authorizing Provider  acetaminophen (TYLENOL) 500 MG tablet Take 1,000 mg by mouth every 6 (six) hours as needed for mild pain or headache.   Yes [provider]  albuterol (PROVENTIL) (2.5 MG/3ML) 0.083% nebulizer solution Take 3 mLs (2.5 mg total) by nebulization every 6 (six) hours as needed for wheezing or shortness of breath. 03/30/21  Yes Newlin, Enobong, MD  albuterol (VENTOLIN HFA) 108 (90 Base) MCG/ACT inhaler Inhale 2 puffs into the lungs every 6 (six) hours as needed for wheezing or shortness of breath. 12/12/20  Yes Charlott Rakes, MD  aspirin 325 MG tablet Take 325 mg by mouth daily.   Yes [provider]  atorvastatin (LIPITOR) 40 MG tablet TAKE 1 TABLET BY MOUTH DAILY AT 6 PM. Patient taking differently: Take 40 mg by mouth daily at 6 PM. 05/23/20  Yes Newlin, Enobong, MD  B Complex-C-Zn-Folic Acid (DIALYVITE 027 WITH ZINC) 0.8 MG TABS Take 1 tablet by mouth at bedtime. 08/12/20  Yes [provider]  calcium acetate (PHOSLO) 667 MG capsule Take 1,334 mg by mouth in the morning, at noon, in the evening, and at bedtime. With meals and a snack   Yes [provider]   carvedilol (COREG) 12.5 MG tablet TAKE 1 TABLET (12.5 MG TOTAL) BY MOUTH 2 (TWO) TIMES DAILY WITH A MEAL. Patient taking differently: Take 12.5 mg by mouth 2 (two) times daily with a meal. 06/15/20 06/15/21 Yes Newlin, Enobong, MD  FLUoxetine (PROZAC) 20 MG capsule Take 1 capsule (20 mg total) by mouth daily. 12/12/20  Yes Charlott Rakes, MD  gabapentin (NEURONTIN) 100 MG capsule Take 1 capsule by mouth three times a day Patient taking differently: Take 100 mg by mouth at bedtime. 01/10/21  Yes   hydrOXYzine (ATARAX/VISTARIL) 25 MG tablet Take 1 tablet (25 mg total) by mouth 3 (three) times daily as needed. Patient taking differently: Take 25 mg by mouth 3 (three) times daily as needed for anxiety. 12/12/20  Yes Charlott Rakes, MD  lidocaine-prilocaine (EMLA) cream Apply 1 application topically See admin instructions. For use on dialysis days. 12/04/20  Yes [provider]  losartan (COZAAR) 50 MG tablet Take 50 mg by mouth every evening.   Yes [provider]  midodrine (PROAMATINE) 10 MG tablet Take 1 tablet (10 mg total) by mouth every Monday, Wednesday, and Friday with hemodialysis. Patient taking differently: Take 10 mg by mouth See admin instructions. Monday ,Wednesday and Friday. As needed if blood pressure is low. 01/31/21  Yes Newlin, Enobong, MD  OXYGEN Inhale 3 L into the lungs continuous.   Yes [provider]  Tiotropium Bromide Monohydrate (SPIRIVA RESPIMAT) 2.5 MCG/ACT AERS Inhale 2 puffs into the lungs daily. 01/30/21  Yes Charlott Rakes, MD  bacitracin-polymyxin b (POLYSPORIN) ophthalmic ointment Place 1 application into the right eye every 12 (twelve) hours. apply to eye every 12 hours while awake Patient not taking: Reported on 04/04/2021 01/30/21   Charlott Rakes, MD  Blood Glucose Monitoring Suppl (BLOOD GLUCOSE METER) kit Use as instructed 11/01/13   Reyne Dumas, MD  Blood Glucose Monitoring Suppl (TRUE METRIX METER) DEVI 1 each by Does not apply route 3  (three) times daily before meals. 06/02/20   Charlott Rakes, MD  glucose blood (TRUETEST TEST) test strip Use as instructed 06/02/20   Charlott Rakes, MD  Lancets (FREESTYLE) lancets Use as instructed  11/01/13   Reyne Dumas, MD  metoCLOPramide (REGLAN) 5 MG tablet TAKE 1 TABLET BY MOUTH FOUR TIMES DAILY, BEFORE MEALS AND AT BEDTIME. Patient not taking: Reported on 04/04/2021 11/13/20 11/13/21  Willia Craze, NP  Misc. Devices MISC Large depends. Diagnosis - fecal incontinence 01/30/21   Charlott Rakes, MD  TRUEplus Lancets 28G MISC 1 each by Does not apply route 3 (three) times daily before meals. 06/02/20   Charlott Rakes, MD  amLODipine (NORVASC) 10 MG tablet Take 1 tablet (10 mg total) by mouth daily. 08/01/20 11/09/20  Terrilee Croak, MD  hydrALAZINE (APRESOLINE) 25 MG tablet Take 1 tablet (25 mg total) by mouth every 8 (eight) hours. 07/31/20 11/09/20  Terrilee Croak, MD  omeprazole (PRILOSEC) 20 MG capsule TAKE 1 CAPSULE (20 MG TOTAL) BY MOUTH 2 (TWO) TIMES DAILY BEFORE A MEAL. 08/01/20 08/22/20  Charlott Rakes, MD     Critical care time: 32 min    Erick Colace ACNP-BC [facility] Pager # [phone] OR # [phone] if no answer

## 2021-04-07 NOTE — Progress Notes (Signed)
[facility] Progress Note  Subjective: we attempted to dialyze pt off the floor on 5 C where we usually dialyze COVID + pts who are not in ICU.  She was unable to tolerated HD there, most likely due to the inability to provide the very high flow HFNC O2 that she was getting on [place].  She had very little dialysis. We are still waiting on a standing weight. Is on the list for HD again today, but will not be able to dialyze her up on [place].   Vitals:   04/07/21 0736 04/07/21 0738 04/07/21 0804 04/07/21 0826  BP:   (!) 138/95   Pulse:   (!) 57 61  Resp:   20   Temp:   (!) 97.5 F (36.4 C)   TempSrc:   Oral   SpO2: 95% 95% 98%   Weight:   78.7 kg   Height:   '5\' 3"'$  (1.6 m)     Exam: General: chronically ill appearing female in NAD Head: NCAT sclera not icteric MMM Neck: Supple. No lymphadenopathy Lungs: mostly clear, faint crackles in lower lobes, nml WOB on HFNC Heart: RRR. No murmur, rubs or gallops. Abdomen: soft, nontender, +BS, no guarding, no rebound tenderness M/S:  Equal strength b/l in upper and lower extremities. Lower extremities:no edema, ischemic changes, or open wounds Neuro: AAOx3. Moves all extremities spontaneously. Psych:  Responds to questions appropriately with a normal affect. Dialysis Access: LU AVF +b/t   Dialysis:  MWF [facility]  3h  400/500  77kg  3K/2.5Ca bath  LUA AVF  Hep 5000  Mircera 225 mcg q2wks - last 7/18 Venofer '100mg'$  qHD x10 (2 completed) Hectorol 55mg IV qHD   Assessment/Plan:  COVID PNA- tested positive on 7/27. Per PMD Acute on chronic respiratory failure - 2/2 #1 & likely superimposed by bacterial PNA and possibly pulm edema. On 4L O2 at home. per PMD  ESRD -  on HD MWFSat. Didn't tolerated HD last [facility]C [facility]. Will retry tonight in ICU, should be more stable. Max UF 3-4 L w/ HD tonight.   Hypertension/volume  - Blood pressure elevated.  Usually takes midodrine '10mg'$  pre HD, will start on midodrine 10 tid  for now. Continue home meds. CXR suggesting pulm edema. Is only 1kg up. High UF goal w/ HD tonight given her resp status.   Anemia of CKD - Hgb 9.6.  Will hold iron in setting of acute infection.  ESA due 8/1  Secondary Hyperparathyroidism -  Ca in goal. Check phos. Continue VDRA and phoslo 2 AC TID  Nutrition - Renal diet w/fluid restrictions     Rob Exodus Kutzer 04/07/2021, 9:24 AM   Recent Labs  Lab 04/06/21 0308 04/07/21 0221  K 3.5 4.1  BUN 47* 50*  CREATININE 5.04* 4.88*  CALCIUM 8.5* 8.3*  HGB 10.5* 9.8*    Inpatient medications:  vitamin C  500 mg Oral Daily   aspirin  325 mg Oral Daily   atorvastatin  40 mg Oral q1800   calcium acetate  1,334 mg Oral TID WC   carvedilol  12.5 mg Oral BID WC   Chlorhexidine Gluconate Cloth  6 each Topical Q0600   FLUoxetine  20 mg Oral Daily   gabapentin  100 mg Oral QHS   heparin  5,000 Units Subcutaneous Q8H   insulin aspart  0-5 Units Subcutaneous QHS   insulin aspart  0-9 Units Subcutaneous TID WC   Ipratropium-Albuterol  1 puff Inhalation  Q6H   losartan  50 mg Oral QPM   methylPREDNISolone (SOLU-MEDROL) injection  80 mg Intravenous Q12H   Followed by   Derrill Memo ON 04/08/2021] predniSONE  50 mg Oral Daily   multivitamin with minerals  1 tablet Oral Daily   umeclidinium bromide  1 puff Inhalation Daily   zinc sulfate  220 mg Oral Daily    remdesivir 200 mg in sodium chloride 0.9% 250 mL IVPB     Followed by   remdesivir 100 mg in NS 100 mL     acetaminophen, albuterol, guaiFENesin-dextromethorphan, hydrALAZINE, labetalol, melatonin, ondansetron (ZOFRAN) IV, polyethylene glycol

## 2021-04-07 NOTE — Progress Notes (Signed)
Received call from dialysis, patient saying she does not feel well, complaining of being hot.  Patient laying in bed, saying she is not right and wants to go back to her room.  Patient transported back to her room, positioned for comfort, placed fans for patient.  Placed humidified oxygen, PRN labetolol given for elevated BP. Patient says she feels better and is cooling down.  Respiratory paged to see patient, MD made aware patient did not tolerate dialysis and may not be able to tolerate leaving her room for dialysis. No new orders at this time.

## 2021-04-08 LAB — COMPREHENSIVE METABOLIC PANEL
ALT: 18 U/L (ref 0–44)
AST: 19 U/L (ref 15–41)
Albumin: 3.6 g/dL (ref 3.5–5.0)
Alkaline Phosphatase: 81 U/L (ref 38–126)
Anion gap: 13 (ref 5–15)
BUN: 25 mg/dL — ABNORMAL HIGH (ref 8–23)
CO2: 27 mmol/L (ref 22–32)
Calcium: 9 mg/dL (ref 8.9–10.3)
Chloride: 95 mmol/L — ABNORMAL LOW (ref 98–111)
Creatinine, Ser: 2.56 mg/dL — ABNORMAL HIGH (ref 0.44–1.00)
GFR, Estimated: 20 mL/min — ABNORMAL LOW (ref >60)
Glucose, Bld: 182 mg/dL — ABNORMAL HIGH (ref 70–99)
Potassium: 3.1 mmol/L — ABNORMAL LOW (ref 3.5–5.1)
Sodium: 135 mmol/L (ref 135–145)
Total Bilirubin: 0.5 mg/dL (ref 0.3–1.2)
Total Protein: 7.7 g/dL (ref 6.5–8.1)

## 2021-04-08 LAB — CBC WITH DIFFERENTIAL/PLATELET
Abs Immature Granulocytes: 0.16 10*3/uL — ABNORMAL HIGH (ref 0.00–0.07)
Basophils Absolute: 0 10*3/uL (ref 0.0–0.1)
Basophils Relative: 0 %
Eosinophils Absolute: 0 10*3/uL (ref 0.0–0.5)
Eosinophils Relative: 0 %
HCT: 38.5 % (ref 36.0–46.0)
Hemoglobin: 11.6 g/dL — ABNORMAL LOW (ref 12.0–15.0)
Immature Granulocytes: 2 %
Lymphocytes Relative: 6 %
Lymphs Abs: 0.5 10*3/uL — ABNORMAL LOW (ref 0.7–4.0)
MCH: 26.7 pg (ref 26.0–34.0)
MCHC: 30.1 g/dL (ref 30.0–36.0)
MCV: 88.5 fL (ref 80.0–100.0)
Monocytes Absolute: 0.2 10*3/uL (ref 0.1–1.0)
Monocytes Relative: 2 %
Neutro Abs: 7.5 10*3/uL (ref 1.7–7.7)
Neutrophils Relative %: 90 %
Platelets: 236 10*3/uL (ref 150–400)
RBC: 4.35 MIL/uL (ref 3.87–5.11)
RDW: 18.3 % — ABNORMAL HIGH (ref 11.5–15.5)
WBC: 8.3 10*3/uL (ref 4.0–10.5)
nRBC: 0 % (ref 0.0–0.2)

## 2021-04-08 LAB — GLUCOSE, CAPILLARY
Glucose-Capillary: 273 mg/dL — ABNORMAL HIGH (ref 70–99)
Glucose-Capillary: 209 mg/dL — ABNORMAL HIGH (ref 70–99)
Glucose-Capillary: 222 mg/dL — ABNORMAL HIGH (ref 70–99)
Glucose-Capillary: 285 mg/dL — ABNORMAL HIGH (ref 70–99)

## 2021-04-08 LAB — BRAIN NATRIURETIC PEPTIDE: B Natriuretic Peptide: 4344.9 pg/mL — ABNORMAL HIGH (ref 0.0–100.0)

## 2021-04-08 LAB — D-DIMER, QUANTITATIVE: D-Dimer, Quant: 1.46 ug/mL-FEU — ABNORMAL HIGH (ref 0.00–0.50)

## 2021-04-08 LAB — C-REACTIVE PROTEIN: CRP: 1.2 mg/dL — ABNORMAL HIGH (ref <1.0)

## 2021-04-08 MED ORDER — IPRATROPIUM-ALBUTEROL 20-100 MCG/ACT IN AERS
1.0000 | INHALATION_SPRAY | Freq: Three times a day (TID) | RESPIRATORY_TRACT | Status: DC
  Administered 2021-04-09: 1 via RESPIRATORY_TRACT
  Filled 2021-04-08: qty 4

## 2021-04-08 NOTE — Progress Notes (Signed)
NAME:  Tina Gomez, MRN:  [id number], DOB:  Oct 10, 1955, LOS: 4 ADMISSION DATE:  04/04/2021, CONSULTATION DATE:  7/30 REFERRING MD:  singh, CHIEF COMPLAINT:  acute respiratory failure    History of Present Illness:  This is a 65 year old female patient with history as provided below.  Admitted 7/27 with fever, and shortness of breath, was positive for COVID.  Admitted to the internal medicine service with working diagnosis of COVID-pneumonia +/- volume overload.  Placed on IV systemic steroids and remdesivir.  Continued to require significant supplemental oxygen, due to logistics her last dialysis treatment was 7/27 (day of admit), attempted dialysis on 7/29 was acutely hypertensive, started to feel flush and short of breath demanded she be removed from dialysis stating something was wrong. 7/30: Critical care asked to evaluate given need for high flow oxygen, and logistics of providing this support in the dialysis suite Pertinent  Medical History  Chronic hypoxic respiratory failure, prior tobacco abuse, stopped smoking 2 months prior to admission, COPD.  End-stage renal disease.  M/W/F/S 3 hours schedule as typically gets hypotensive if has extended dialysis therapy Chronic hypotension on midodrine prior to dialysis Hyperparathyroidism, anemia of chronic disease.  Diabetes.  Hyperlipidemia.  Hypertension.  Prior stroke.  Restless leg syndrome.  Significant Hospital Events: Including procedures, antibiotic start and stop dates in addition to other pertinent events   7/27: Admitted, COVID-positive.  Had received dialysis outpatient setting.  Started on Solu-Medrol and remdesivir 7/27 through 7/29: Receiving treatment for COVID, this included remdesivir and Solu-Medrol as well as supplemental oxygen requiring high flow oxygen 20 to 30 L, 50%.  On 7/29 first attempt at dialysis.  Patient became hypertensive, diaphoretic, demanding dialysis be discontinued was treated with labetalol and seem to  improve 7/30 critical care asked to evaluate.  There was concern about need for high flow, also clinical decompensation in dialysis suite nephrology team asking to dialyze in the intensive care.  Interim History / Subjective:   No acute events overnight. Dialysis performed without issues last evening with 3L pulled.   Patient feeling better today. Still requiring HFNC.   Objective   Blood pressure (!) 133/116, pulse 60, temperature 98.7 F (37.1 C), temperature source Oral, resp. rate (!) 48, height '5\' 3"'$  (1.6 m), weight 78.7 kg, SpO2 92 %.    FiO2 (%):  [40 %-50 %] 50 %   Intake/Output Summary (Last 24 hours) at 04/08/2021 1206 Last data filed at 04/08/2021 0400 Gross per 24 hour  Intake 300 ml  Output 3770 ml  Net -3470 ml    Filed Weights   04/07/21 0804  Weight: 78.7 kg    Examination: General: no acute distress, eating lunch in bed HEENT: [place]/AT, moist mucous membranes, sclera anicteric Neuro: A&O x 3, moving all extremities CV: rrr, s1s2, no murmurs PULM: no wheezing, improve air movement bilaterally GI: soft, non-tender, non-distended, BS+ Extremities: warm, no edema Skin: no rashes  Resolved Hospital Problem list     Assessment & Plan:  Acute on chronic hypoxic respiratory failure in the setting of bilateral pulmonary infiltrate/COVID pneumonia, further complicated by pulmonary edema/volume overload superimposed on history of tobacco abuse and COPD Continue supplemental high flow oxygen Volume removal with dialysis, try to wean off HFNC after next dialysis session on 8/1 Day #5 systemic steroids and IV remdesivir for COVID Continue scheduled bronchodilators Continue pulse oximetry  End-stage renal disease.  Complicated history with her dialysis tolerance.  Can only tolerate a 3-hour therapy, and therefore is typically on a  Monday, Wednesday, Friday, Saturday schedule.  She has missed 1 scheduled treatment -Didn't tolerate dialysis 7/29 but this was likely due to  the dialysis center not being able to accommodate HFNC Plan iHD per nephrology, next session 8/1   Complicated history of hypertension at baseline, and dialysis induced hypotension Plan Continuing scheduled Coreg, Cozaar and as needed's as needed  Anemia of chronic kidney disease Plan Trend CBC transfuse per protocol  Hyperlipidemia Plan Continue statin  Type 2 diabetes.  Steroid-induced hyperglycemia Plan Will advance to resistant scale    Best Practice (right click and "Reselect all SmartList Selections" daily)   Diet/type: Regular consistency (see orders) DVT prophylaxis: prophylactic heparin  GI prophylaxis: N/A Lines: N/A Foley:  N/A Code Status:  full code Last date of multidisciplinary goals of care discussion [pending ]  Labs   CBC: Recent Labs  Lab 04/04/21 2043 04/05/21 0339 04/06/21 0308 04/07/21 0221 04/07/21 1340 04/08/21 0313  WBC 5.0 4.0 8.2 10.4 7.3 8.3  NEUTROABS 3.3 3.4 7.2 9.4*  --  7.5  HGB 9.9* 9.6* 10.5* 9.8* 10.0* 11.6*  HCT 34.1* 33.7* 36.0 34.0* 33.7* 38.5  MCV 92.2 93.9 91.8 92.6 92.8 88.5  PLT 211 185 217 219 210 236     Basic Metabolic Panel: Recent Labs  Lab 04/05/21 0339 04/06/21 0308 04/07/21 0221 04/07/21 1340 04/08/21 0313  NA 137 137 136 136 135  K 3.2* 3.5 4.1 3.7 3.1*  CL 99 96* 95* 97* 95*  CO2 '28 27 26 23 27  '$ GLUCOSE 222* 222* 246* 155* 182*  BUN 20 47* 50* 63* 25*  CREATININE 3.81* 5.04* 4.88* 5.14* 2.56*  CALCIUM 8.6* 8.5* 8.3* 8.3* 9.0  PHOS  --   --   --  8.1*  --     GFR: Estimated Creatinine Clearance: 22 mL/min (A) (by C-G formula based on SCr of 2.56 mg/dL (H)). Recent Labs  Lab 04/05/21 0339 04/06/21 0308 04/07/21 0221 04/07/21 1340 04/08/21 0313  PROCALCITON 0.30  --   --   --   --   WBC 4.0 8.2 10.4 7.3 8.3     Liver Function Tests: Recent Labs  Lab 04/04/21 2043 04/05/21 0339 04/06/21 0308 04/07/21 0221 04/07/21 1340 04/08/21 0313  AST 13* 12* 17 16  --  19  ALT '10 9 11 13   '$ --  18  ALKPHOS 68 70 66 67  --  81  BILITOT 0.2* 0.2* 0.7 0.2*  --  0.5  PROT 6.2* 6.1* 6.5 6.1*  --  7.7  ALBUMIN 2.9* 2.8* 3.1* 2.9* 2.9* 3.6    No results for input(s): LIPASE, AMYLASE in the last 168 hours. No results for input(s): AMMONIA in the last 168 hours.  ABG    Component Value Date/Time   HCO3 26.1 05/25/2020 0107   TCO2 24 02/20/2021 0523   O2SAT 99.0 05/25/2020 0107      Coagulation Profile: No results for input(s): INR, PROTIME in the last 168 hours.  Cardiac Enzymes: No results for input(s): CKTOTAL, CKMB, CKMBINDEX, TROPONINI in the last 168 hours.  HbA1C: HbA1c, POC (controlled diabetic range)  Date/Time Value Ref Range Status  05/23/2020 04:13 PM 7.6 (A) 0.0 - 7.0 % Final  03/10/2018 09:08 AM 9.6 (A) 0.0 - 7.0 % Final   Hgb A1c MFr Bld  Date/Time Value Ref Range Status  04/06/2021 03:08 AM 6.2 (H) 4.8 - 5.6 % Final    Comment:    (NOTE) Pre diabetes:  5.7%-6.4%  Diabetes:              >6.4%  Glycemic control for   <7.0% adults with diabetes   11/08/2020 04:42 AM 6.4 (H) 4.8 - 5.6 % Final    Comment:    (NOTE) Pre diabetes:          5.7%-6.4%  Diabetes:              >6.4%  Glycemic control for   <7.0% adults with diabetes     CBG: Recent Labs  Lab 04/06/21 2034 04/07/21 0619 04/07/21 1638 04/07/21 2139 04/08/21 0654  GLUCAP 179* 200* 220* 307* 273*        Critical care time: 35 min     Freda Jackson, MD [facility] Office: [phone]   See Amion for personal pager PCCM on call pager [phone] until 7pm. Please call Elink 7p-7a. [phone]

## 2021-04-08 NOTE — Progress Notes (Signed)
[facility] Progress Note  Subjective: good HD last night w/ 3 L off, patient feeling better, able to lie flat today. Still on same O2 support as yesterday.   Vitals:   04/08/21 0800 04/08/21 0849 04/08/21 0853 04/08/21 0916  BP: (!) 146/51 (!) 146/51  (!) 133/116  Pulse: 62 69  60  Resp: 16 (!) 48    Temp: 98.7 F (37.1 C)     TempSrc: Oral     SpO2: 95% 95% 92%   Weight:      Height:        Exam: General: chronically ill appearing female in NAD Head: NCAT sclera not icteric MMM Neck: Supple. No lymphadenopathy Lungs: mostly clear, occ crackles Heart: RRR. No murmur, rubs or gallops. Abdomen: soft, nontender, +BS, no guarding, no rebound tenderness M/S:  Equal strength b/l in upper and lower extremities. Lower extremities:no edema, ischemic changes, or open wounds Neuro: AAOx3. Moves all extremities spontaneously. Psych:  Responds to questions appropriately with a normal affect. Dialysis Access: LU AVF +b/t   Dialysis:  MWF [facility]  3h  400/500  77kg  3K/2.5Ca bath  LUA AVF  Hep 5000  Mircera 225 mcg q2wks - last 7/18 Venofer '100mg'$  qHD x10 (2 completed) Hectorol 53mg IV qHD   Assessment/Plan:  COVID PNA- tested positive on 7/27. Per PMD Acute on chronic respiratory failure - 2/2 #1 & likely superimposed by bacterial PNA and vol excess/ pulm edema. On 4L O2 at home. As below.   ESRD -  on HD MWFSat for 3h. Good HD last night, 3L UF. Looks better. Plan HD again tomorrow, cont to lower vol as tolerates.   Hypertension/volume  - Blood pressure elevated.  Usually takes midodrine '10mg'$  pre HD > started '10mg'$  tid here. Continue home meds as needed  Anemia of CKD - Hgb 9.6.  Will hold iron in setting of acute infection.  ESA due 8/1  Secondary Hyperparathyroidism -  Ca in goal. Check phos. Continue VDRA and phoslo 2 AC TID  Nutrition - Renal diet w/fluid restrictions     Rob Demarcus Thielke 04/08/2021, 11:48 AM   Recent Labs  Lab 04/07/21 1340 04/08/21 0313   K 3.7 3.1*  BUN 63* 25*  CREATININE 5.14* 2.56*  CALCIUM 8.3* 9.0  PHOS 8.1*  --   HGB 10.0* 11.6*    Inpatient medications:  vitamin C  500 mg Oral Daily   aspirin  325 mg Oral Daily   atorvastatin  40 mg Oral q1800   calcium acetate  1,334 mg Oral TID WC   carvedilol  12.5 mg Oral BID WC   Chlorhexidine Gluconate Cloth  6 each Topical Q0600   FLUoxetine  20 mg Oral Daily   gabapentin  100 mg Oral QHS   heparin  5,000 Units Subcutaneous Q8H   heparin  5,000 Units Dialysis Once in dialysis   insulin aspart  0-20 Units Subcutaneous TID WC   insulin aspart  0-5 Units Subcutaneous QHS   Ipratropium-Albuterol  1 puff Inhalation Q6H   losartan  50 mg Oral QPM   midodrine  10 mg Oral TID WC   multivitamin with minerals  1 tablet Oral Daily   predniSONE  50 mg Oral Daily   zinc sulfate  220 mg Oral Daily    sodium chloride     sodium chloride     remdesivir 200 mg in sodium chloride 0.9% 250 mL IVPB     Followed by   remdesivir 100 mg in  NS 100 mL Stopped (04/07/21 1344)   sodium chloride, sodium chloride, acetaminophen, albuterol, alteplase, guaiFENesin-dextromethorphan, heparin, heparin, hydrALAZINE, labetalol, lidocaine (PF), lidocaine-prilocaine, melatonin, ondansetron (ZOFRAN) IV, pentafluoroprop-tetrafluoroeth, polyethylene glycol

## 2021-04-09 ENCOUNTER — Encounter (HOSPITAL_COMMUNITY): Payer: Self-pay | Admitting: Internal Medicine

## 2021-04-09 DIAGNOSIS — U071 COVID-19: Principal | ICD-10-CM

## 2021-04-09 DIAGNOSIS — J069 Acute upper respiratory infection, unspecified: Secondary | ICD-10-CM

## 2021-04-09 LAB — CBC WITH DIFFERENTIAL/PLATELET
Abs Immature Granulocytes: 0.09 10*3/uL — ABNORMAL HIGH (ref 0.00–0.07)
Basophils Absolute: 0 10*3/uL (ref 0.0–0.1)
Basophils Relative: 0 %
Eosinophils Absolute: 0 10*3/uL (ref 0.0–0.5)
Eosinophils Relative: 0 %
HCT: 35.9 % — ABNORMAL LOW (ref 36.0–46.0)
Hemoglobin: 10.7 g/dL — ABNORMAL LOW (ref 12.0–15.0)
Immature Granulocytes: 1 %
Lymphocytes Relative: 14 %
Lymphs Abs: 1.3 10*3/uL (ref 0.7–4.0)
MCH: 26.8 pg (ref 26.0–34.0)
MCHC: 29.8 g/dL — ABNORMAL LOW (ref 30.0–36.0)
MCV: 89.8 fL (ref 80.0–100.0)
Monocytes Absolute: 0.8 10*3/uL (ref 0.1–1.0)
Monocytes Relative: 8 %
Neutro Abs: 7.5 10*3/uL (ref 1.7–7.7)
Neutrophils Relative %: 77 %
Platelets: 208 10*3/uL (ref 150–400)
RBC: 4 MIL/uL (ref 3.87–5.11)
RDW: 18.1 % — ABNORMAL HIGH (ref 11.5–15.5)
WBC: 9.7 10*3/uL (ref 4.0–10.5)
nRBC: 0 % (ref 0.0–0.2)

## 2021-04-09 LAB — D-DIMER, QUANTITATIVE: D-Dimer, Quant: 1.39 ug/mL-FEU — ABNORMAL HIGH (ref 0.00–0.50)

## 2021-04-09 LAB — COMPREHENSIVE METABOLIC PANEL
ALT: 13 U/L (ref 0–44)
AST: 14 U/L — ABNORMAL LOW (ref 15–41)
Albumin: 2.8 g/dL — ABNORMAL LOW (ref 3.5–5.0)
Alkaline Phosphatase: 72 U/L (ref 38–126)
Anion gap: 13 (ref 5–15)
BUN: 68 mg/dL — ABNORMAL HIGH (ref 8–23)
CO2: 25 mmol/L (ref 22–32)
Calcium: 8.7 mg/dL — ABNORMAL LOW (ref 8.9–10.3)
Chloride: 100 mmol/L (ref 98–111)
Creatinine, Ser: 5.04 mg/dL — ABNORMAL HIGH (ref 0.44–1.00)
GFR, Estimated: 9 mL/min — ABNORMAL LOW (ref >60)
Glucose, Bld: 155 mg/dL — ABNORMAL HIGH (ref 70–99)
Potassium: 2.9 mmol/L — ABNORMAL LOW (ref 3.5–5.1)
Sodium: 138 mmol/L (ref 135–145)
Total Bilirubin: 0.3 mg/dL (ref 0.3–1.2)
Total Protein: 5.9 g/dL — ABNORMAL LOW (ref 6.5–8.1)

## 2021-04-09 LAB — GLUCOSE, CAPILLARY
Glucose-Capillary: 126 mg/dL — ABNORMAL HIGH (ref 70–99)
Glucose-Capillary: 123 mg/dL — ABNORMAL HIGH (ref 70–99)
Glucose-Capillary: 131 mg/dL — ABNORMAL HIGH (ref 70–99)

## 2021-04-09 LAB — MAGNESIUM: Magnesium: 2.6 mg/dL — ABNORMAL HIGH (ref 1.7–2.4)

## 2021-04-09 LAB — C-REACTIVE PROTEIN: CRP: 1.5 mg/dL — ABNORMAL HIGH (ref <1.0)

## 2021-04-09 LAB — BRAIN NATRIURETIC PEPTIDE: B Natriuretic Peptide: 4358.2 pg/mL — ABNORMAL HIGH (ref 0.0–100.0)

## 2021-04-09 MED ORDER — MIDODRINE HCL 5 MG PO TABS: 10.0000 mg | ORAL_TABLET | ORAL | Status: DC

## 2021-04-09 MED ORDER — CARVEDILOL 6.25 MG PO TABS
6.2500 mg | ORAL_TABLET | Freq: Two times a day (BID) | ORAL | Status: DC
  Administered 2021-04-09 – 2021-04-15 (×8): 6.25 mg via ORAL
  Filled 2021-04-09 (×8): qty 1

## 2021-04-09 MED ORDER — LOSARTAN POTASSIUM 25 MG PO TABS
25.0000 mg | ORAL_TABLET | Freq: Every evening | ORAL | Status: DC
  Administered 2021-04-10 – 2021-04-13 (×3): 25 mg via ORAL
  Filled 2021-04-09 (×4): qty 1

## 2021-04-09 MED ORDER — FLUCONAZOLE 200 MG PO TABS
200.0000 mg | ORAL_TABLET | Freq: Once | ORAL | Status: AC
  Administered 2021-04-09: 200 mg via ORAL
  Filled 2021-04-09: qty 1

## 2021-04-09 MED ORDER — POTASSIUM CHLORIDE CRYS ER 20 MEQ PO TBCR
40.0000 meq | EXTENDED_RELEASE_TABLET | Freq: Once | ORAL | Status: AC
  Administered 2021-04-09: 40 meq via ORAL
  Filled 2021-04-09: qty 2

## 2021-04-09 MED ORDER — DOXERCALCIFEROL 4 MCG/2ML IV SOLN
1.0000 ug | INTRAVENOUS | Status: DC
  Filled 2021-04-09 (×3): qty 2

## 2021-04-09 NOTE — Progress Notes (Signed)
eLink Physician-Brief Progress Note Patient Name: SALSABIL SHARPE DOB: 1955/12/16 MRN: [id number]   Date of Service  04/09/2021  HPI/Events of Note  Notified of K 2.9 ESRD on HD  eICU Interventions  Ordered K 40 meqs x 1. Further correction as per nephrology Add on magnesium level     Intervention Category Major Interventions: Electrolyte abnormality - evaluation and management  Judd Lien 04/09/2021, 5:08 AM

## 2021-04-09 NOTE — Progress Notes (Signed)
Pt removed from [facility] at this time and placed on Hf salter at 10LPM and tolerating well.  Pt in no distress and sats increased from 90% on HHFNC to 95% on salter.  RT will continue to monitor as needed.

## 2021-04-09 NOTE — Progress Notes (Addendum)
NAME:  Tina Gomez, MRN:  [id number], DOB:  06-17-1956, LOS: 5 ADMISSION DATE:  04/04/2021, CONSULTATION DATE:  7/30 REFERRING MD:  singh, CHIEF COMPLAINT:  acute respiratory failure    History of Present Illness:  This is a 65 year old female patient with history as provided below.  Admitted 7/27 with fever, and shortness of breath, was positive for COVID.  Admitted to the internal medicine service with working diagnosis of COVID-pneumonia +/- volume overload.  Placed on IV systemic steroids and remdesivir.  Continued to require significant supplemental oxygen, due to logistics her last dialysis treatment was 7/27 (day of admit), attempted dialysis on 7/29 was acutely hypertensive, started to feel flush and short of breath demanded she be removed from dialysis stating something was wrong. 7/30: Critical care asked to evaluate given need for high flow oxygen, and logistics of providing this support in the dialysis suite 8/1: stable on 10L HFNC  Pertinent  Medical History   Chronic hypoxic respiratory failure, prior tobacco abuse, stopped smoking 2 months prior to admission, COPD.  End-stage renal disease.  M/W/F/S 3 hours schedule as typically gets hypotensive if has extended dialysis therapy Chronic hypotension on midodrine prior to dialysis Hyperparathyroidism, anemia of chronic disease.  Diabetes.  Hyperlipidemia.  Hypertension.  Prior stroke.  Restless leg syndrome.  Significant Hospital Events: Including procedures, antibiotic start and stop dates in addition to other pertinent events   7/27: Admitted, COVID-positive.  Had received dialysis outpatient setting.  Started on Solu-Medrol and remdesivir 7/27 through 7/29: Receiving treatment for COVID, this included remdesivir and Solu-Medrol as well as supplemental oxygen requiring high flow oxygen 20 to 30 L, 50%.  On 7/29 first attempt at dialysis.  Patient became hypertensive, diaphoretic, demanding dialysis be discontinued was treated with  labetalol and seem to improve 7/30 critical care asked to evaluate.  There was concern about need for high flow, also clinical decompensation in dialysis suite nephrology team asking to dialyze in the intensive care.  Interim History / Subjective:   NAD, following commands. On HHFNC   Objective   Blood pressure (!) 149/61, pulse (!) 52, temperature 98.3 F (36.8 C), temperature source Oral, resp. rate (!) 22, height '5\' 3"'$  (1.6 m), weight 76.2 kg, SpO2 93 %.    FiO2 (%):  [40 %-50 %] 40 %   Intake/Output Summary (Last 24 hours) at 04/09/2021 [id number] Last data filed at 04/08/2021 1800 Gross per 24 hour  Intake 520 ml  Output 450 ml  Net 70 ml   Filed Weights   04/07/21 0804 04/09/21 0419  Weight: 78.7 kg 76.2 kg    Examination: General: Appears older than stated age, resting in bed no distress HEENT: NCAT, mucous membranes dry Neuro: AAOx3 moving all 4 extremities CV: Regular rate rhythm, S1-S2 no murmur PULM: No crackles no wheeze GI: Soft, nontender nondistended Extremities: Warm, no edema Skin: No rash  Resolved Hospital Problem list     Assessment & Plan:   Acute on chronic hypoxic respiratory failure in the setting of bilateral pulmonary infiltrate/COVID pneumonia, further complicated by pulmonary edema/volume overload superimposed on history of tobacco abuse and COPD Continue supplemental high flow nasal cannula oxygen Continue to wean off. Suspect she will come off today. Can likely switch to Salter catheter. Continue steroids plus remdesivir Continue scheduled bronchodilators Continue mobility  End-stage renal disease. Elevated BNP  Complicated history with her dialysis tolerance.  Can only tolerate a 3-hour therapy, and therefore is typically on a Monday, Wednesday, Friday, Saturday schedule.  She  has missed 1 scheduled treatment -Didn't tolerate dialysis 7/29 but this was likely due to the dialysis center not being able to accommodate HFNC Plan IHD per  nephrology.  Complicated history of hypertension at baseline, and dialysis induced hypotension Plan Continue scheduled Coreg, Cozaar  Anemia of chronic kidney disease Plan Follow H&H Conservative transfusion threshold  Hyperlipidemia Plan Continue statin  Type 2 diabetes.  Steroid-induced hyperglycemia Plan Resistant sliding scale CBGs  Hypokalemia - replete    Best Practice (right click and "Reselect all SmartList Selections" daily)   Diet/type: Regular consistency (see orders) DVT prophylaxis: prophylactic heparin  GI prophylaxis: N/A Lines: N/A Foley:  N/A Code Status:  full code Last date of multidisciplinary goals of care discussion [pending ]  Labs   CBC: Recent Labs  Lab 04/05/21 0339 04/06/21 0308 04/07/21 0221 04/07/21 1340 04/08/21 0313 04/09/21 0341  WBC 4.0 8.2 10.4 7.3 8.3 9.7  NEUTROABS 3.4 7.2 9.4*  --  7.5 7.5  HGB 9.6* 10.5* 9.8* 10.0* 11.6* 10.7*  HCT 33.7* 36.0 34.0* 33.7* 38.5 35.9*  MCV 93.9 91.8 92.6 92.8 88.5 89.8  PLT 185 217 219 210 236 [id number]    Basic Metabolic Panel: Recent Labs  Lab 04/06/21 0308 04/07/21 0221 04/07/21 1340 04/08/21 0313 04/09/21 0341  NA 137 136 136 135 138  K 3.5 4.1 3.7 3.1* 2.9*  CL 96* 95* 97* 95* 100  CO2 '27 26 23 27 25  '$ GLUCOSE 222* 246* 155* 182* 155*  BUN 47* 50* 63* 25* 68*  CREATININE 5.04* 4.88* 5.14* 2.56* 5.04*  CALCIUM 8.5* 8.3* 8.3* 9.0 8.7*  MG  --   --   --   --  2.6*  PHOS  --   --  8.1*  --   --    GFR: Estimated Creatinine Clearance: 11 mL/min (A) (by C-G formula based on SCr of 5.04 mg/dL (H)). Recent Labs  Lab 04/05/21 0339 04/06/21 0308 04/07/21 0221 04/07/21 1340 04/08/21 0313 04/09/21 0341  PROCALCITON 0.30  --   --   --   --   --   WBC 4.0   < > 10.4 7.3 8.3 9.7   < > = values in this interval not displayed.    Liver Function Tests: Recent Labs  Lab 04/05/21 0339 04/06/21 0308 04/07/21 0221 04/07/21 1340 04/08/21 0313 04/09/21 0341  AST 12* 17 16  --  19  14*  ALT '9 11 13  '$ --  18 13  ALKPHOS 70 66 67  --  81 72  BILITOT 0.2* 0.7 0.2*  --  0.5 0.3  PROT 6.1* 6.5 6.1*  --  7.7 5.9*  ALBUMIN 2.8* 3.1* 2.9* 2.9* 3.6 2.8*   No results for input(s): LIPASE, AMYLASE in the last 168 hours. No results for input(s): AMMONIA in the last 168 hours.  ABG    Component Value Date/Time   HCO3 26.1 05/25/2020 0107   TCO2 24 02/20/2021 0523   O2SAT 99.0 05/25/2020 0107     Coagulation Profile: No results for input(s): INR, PROTIME in the last 168 hours.  Cardiac Enzymes: No results for input(s): CKTOTAL, CKMB, CKMBINDEX, TROPONINI in the last 168 hours.  HbA1C: HbA1c, POC (controlled diabetic range)  Date/Time Value Ref Range Status  05/23/2020 04:13 PM 7.6 (A) 0.0 - 7.0 % Final  03/10/2018 09:08 AM 9.6 (A) 0.0 - 7.0 % Final   Hgb A1c MFr Bld  Date/Time Value Ref Range Status  04/06/2021 03:08 AM 6.2 (H) 4.8 - 5.6 % Final  Comment:    (NOTE) Pre diabetes:          5.7%-6.4%  Diabetes:              >6.4%  Glycemic control for   <7.0% adults with diabetes   11/08/2020 04:42 AM 6.4 (H) 4.8 - 5.6 % Final    Comment:    (NOTE) Pre diabetes:          5.7%-6.4%  Diabetes:              >6.4%  Glycemic control for   <7.0% adults with diabetes     CBG: Recent Labs  Lab 04/08/21 0654 04/08/21 1130 04/08/21 1610 04/08/21 2134 04/09/21 0618  GLUCAP 273* 209* 222* 285* [place], DO [facility] 04/09/2021 7:53 AM

## 2021-04-09 NOTE — Progress Notes (Signed)
Occupational Therapy Treatment Patient Details Name: Tina Gomez MRN: [id number] DOB: 1956-03-06 Today's Date: 04/09/2021    History of present illness 65 y.o. female presents to [facility] ED on 04/04/2021 with fever, cough, SOB. Pt found to be COVID+. Chest x-ray revealing bilateral pulmonary opacities. PMHx: ESRD on HD MWF, COPD, chronic hypoxia on 4 L nasal cannula at baseline, hypertension, hyperlipidemia, anemia of chronic kidney disease, chronic anxiety/depression.   OT comments  Patient seated in recliner upon entry, attempting to eat lunch but reports poor appetite  (took 3 bites of roll). Pt requires min assist for transfers from recliner to EOB, mod assist to return back to supine.  Reports she is "feeling the worst" she has so far today, and unable to tolerate further ADLs or exercise at this time.  On 10L HHFNC at 40% with SpO2 between 89-92%, BP 155/71 and HR ranging 48-50. Will follow acutely, updated dc plan to [facility] services at dc.    Follow Up Recommendations  Home health OT;Supervision/Assistance - 24 hour    Equipment Recommendations  None recommended by OT    Recommendations for Other Services      Precautions / Restrictions Precautions Precautions: Fall;Other (comment) Precaution Comments: monitor O2, COVID Restrictions Weight Bearing Restrictions: No       Mobility Bed Mobility Overal bed mobility: Needs Assistance Bed Mobility: Sit to Supine     Supine to sit: Min assist Sit to supine: Mod assist   General bed mobility comments: support for BLEs to supine, fatigued and required increased assist    Transfers Overall transfer level: Needs assistance   Transfers: Sit to/from Stand;Stand Pivot Transfers Sit to Stand: Min assist Stand pivot transfers: Min assist       General transfer comment: min assist to rise from recliner and pivot to EOB    Balance Overall balance assessment: Needs assistance Sitting-balance support: No upper extremity supported;Feet  supported Sitting balance-Leahy Scale: Good     Standing balance support: Bilateral upper extremity supported;During functional activity Standing balance-Leahy Scale: Poor Standing balance comment: relies on external support from therapist                           ADL either performed or assessed with clinical judgement   ADL Overall ADL's : Needs assistance/impaired Eating/Feeding: Sitting;Modified independent Eating/Feeding Details (indicate cue type and reason): increased effort/time                     Toilet Transfer: Minimal assistance;Stand-pivot Toilet Transfer Details (indicate cue type and reason): simulated from recliner         Functional mobility during ADLs: Minimal assistance General ADL Comments: pt limited by decreased activity tolerance and fatigue     Vision       Perception     Praxis      Cognition Arousal/Alertness: Awake/alert Behavior During Therapy: WFL for tasks assessed/performed Overall Cognitive Status: Within Functional Limits for tasks assessed                                          Exercises General Exercises - Lower Extremity Long Arc Quad: AROM;Both;Seated;15 reps Hip Flexion/Marching: AROM;Both;Seated;15 reps   Shoulder Instructions       General Comments pt on 10L HHFNC at 40% FIO2    Pertinent Vitals/ Pain       Pain  Assessment: No/denies pain  Home Living                                          Prior Functioning/Environment              Frequency  Min 2X/week        Progress Toward Goals  OT Goals(current goals can now be found in the care plan section)  Progress towards OT goals: Not progressing toward goals - comment;OT to reassess next treatment (decreased tolerance, will assess next session)  Acute Rehab OT Goals Patient Stated Goal: to reduce oxygen needs and go home OT Goal Formulation: With patient  Plan Frequency remains  appropriate;Discharge plan needs to be updated    Co-evaluation                 AM-PAC OT "6 Clicks" Daily Activity     Outcome Measure   Help from another person eating meals?: None Help from another person taking care of personal grooming?: A Little Help from another person toileting, which includes using toliet, bedpan, or urinal?: A Lot Help from another person bathing (including washing, rinsing, drying)?: A Lot Help from another person to put on and taking off regular upper body clothing?: A Little Help from another person to put on and taking off regular lower body clothing?: A Lot 6 Click Score: 16    End of Session Equipment Utilized During Treatment: Oxygen  OT Visit Diagnosis: Unsteadiness on feet (R26.81);Muscle weakness (generalized) (M62.81)   Activity Tolerance Patient limited by fatigue   Patient Left in bed;with call bell/phone within reach;with bed alarm set   Nurse Communication Mobility status        Time: [id number] OT Time Calculation (min): 18 min  Charges: OT General Charges $OT Visit: 1 Visit OT Treatments $Self Care/Home Management : 8-22 mins  Jolaine Artist, OT Acute Rehabilitation Services Pager [phone] Office [place] 04/09/2021, 1:30 PM

## 2021-04-09 NOTE — Progress Notes (Signed)
Physical Therapy Treatment Patient Details Name: Tina Gomez MRN: [id number] DOB: 31-Dec-1955 Today's Date: 04/09/2021    History of Present Illness 65 y.o. female presents to [facility] ED on 04/04/2021 with fever, cough, SOB. Pt found to be COVID+. Chest x-ray revealing bilateral pulmonary opacities. PMHx: ESRD on HD MWF, COPD, chronic hypoxia on 4 L nasal cannula at baseline, hypertension, hyperlipidemia, anemia of chronic kidney disease, chronic anxiety/depression.    PT Comments    Pt pleasant but reports fatigue and lack of appetite today. Pt states she was feeling well when admitted but significant decline today with decreased activity tolerance with HR 49-52 and SPo2 89-92% on 10L at 40% FiO2 HHFNC. BP 167/58 Pt educated for HEP, progressive mobility and current assist needed due to fatigue. Max assist for pericare this session.    Follow Up Recommendations  Home health PT;Supervision for mobility/OOB     Equipment Recommendations  None recommended by PT    Recommendations for Other Services       Precautions / Restrictions Precautions Precautions: Fall;Other (comment) Precaution Comments: monitor O2, COVID    Mobility  Bed Mobility Overal bed mobility: Needs Assistance Bed Mobility: Supine to Sit     Supine to sit: Min assist     General bed mobility comments: min assist to elevate trunk and increased time    Transfers Overall transfer level: Needs assistance   Transfers: Sit to/from Stand;Stand Pivot Transfers Sit to Stand: Min assist Stand pivot transfers: Min assist       General transfer comment: min assist to rise from bed and BSC with HHA. Pivot with HHA from bed to [facility] then to recliner  Ambulation/Gait Ambulation/Gait assistance: Min assist Gait Distance (Feet): 6 Feet Assistive device: 1 person hand held assist Gait Pattern/deviations: Step-to pattern   Gait velocity interpretation: <1.8 ft/sec, indicate of risk for recurrent falls General Gait  Details: pt side stepping at EOB limited by fatigue and need to toilet. HHFNC throughout at 10L, 40%   Stairs             Wheelchair Mobility    Modified Rankin (Stroke Patients Only)       Balance Overall balance assessment: Needs assistance Sitting-balance support: No upper extremity supported;Feet supported Sitting balance-Leahy Scale: Good     Standing balance support: Single extremity supported;Bilateral upper extremity supported Standing balance-Leahy Scale: Poor Standing balance comment: UE support in standing today                            Cognition Arousal/Alertness: Awake/alert Behavior During Therapy: WFL for tasks assessed/performed Overall Cognitive Status: Within Functional Limits for tasks assessed                                        Exercises General Exercises - Lower Extremity Long Arc Quad: AROM;Both;Seated;15 reps Hip Flexion/Marching: AROM;Both;Seated;15 reps    General Comments        Pertinent Vitals/Pain Pain Assessment: No/denies pain    Home Living                      Prior Function            PT Goals (current goals can now be found in the care plan section) Progress towards PT goals: Progressing toward goals (limited by fatigue)    Frequency  Min 3X/week      PT Plan Current plan remains appropriate    Co-evaluation              AM-PAC PT "6 Clicks" Mobility   Outcome Measure  Help needed turning from your back to your side while in a flat bed without using bedrails?: A Little Help needed moving from lying on your back to sitting on the side of a flat bed without using bedrails?: A Little Help needed moving to and from a bed to a chair (including a wheelchair)?: A Little Help needed standing up from a chair using your arms (e.g., wheelchair or bedside chair)?: A Little Help needed to walk in hospital room?: A Lot Help needed climbing 3-5 steps with a railing? : A  Lot 6 Click Score: 16    End of Session Equipment Utilized During Treatment: Oxygen Activity Tolerance: Patient limited by fatigue Patient left: in chair;with call bell/phone within reach Nurse Communication: Mobility status PT Visit Diagnosis: Other abnormalities of gait and mobility (R26.89);Muscle weakness (generalized) (M62.81)     Time: [id number] PT Time Calculation (min) (ACUTE ONLY): 29 min  Charges:  $Therapeutic Exercise: 8-22 mins $Therapeutic Activity: 8-22 mins                     Thelmer Legler P, PT Acute Rehabilitation Services Pager: [phone] Office: [employer] [place] 04/09/2021, 12:25 PM

## 2021-04-09 NOTE — Progress Notes (Signed)
[facility] Progress Note  Subjective: oxygen down some-  thinking about moving her to floor bed-  she is managing her breakfast -  c/o vaginal itch-  wants diflucan  Vitals:   04/09/21 0419 04/09/21 0500 04/09/21 0600 04/09/21 0756  BP:   (!) 149/61 (!) 148/59  Pulse:    (!) 52  Resp:  17 (!) 22   Temp:      TempSrc:      SpO2:  93% 93%   Weight: 76.2 kg     Height:        Exam: General: chronically ill appearing female in NAD Head: NCAT sclera not icteric MMM Neck: Supple. No lymphadenopathy Lungs: mostly clear, occ crackles Heart: RRR. No murmur, rubs or gallops. Abdomen: soft, nontender, +BS, no guarding, no rebound tenderness M/S:  Equal strength b/l in upper and lower extremities. Lower extremities:no edema, ischemic changes, or open wounds Neuro: AAOx3. Moves all extremities spontaneously. Psych:  Responds to questions appropriately with a normal affect. Dialysis Access: LU AVF +b/t   Dialysis:  MWF [facility]  3h  400/500  77kg  3K/2.5Ca bath  LUA AVF  Hep 5000  Mircera 225 mcg q2wks - last 7/18 Venofer '100mg'$  qHD x10 (2 completed) Hectorol 15mg IV qHD   Assessment/Plan:  COVID PNA- tested positive on 7/27. Per PMD Acute on chronic respiratory failure - 2/2 #1 & likely superimposed by bacterial PNA and vol excess/ pulm edema. On 4L O2 at home.   O2 has been weaned some overnight   ESRD -  on HD MWFSat for 3h. Good HD Sat night, 3L UF. Looks better. Plan HD again today, cont to lower vol as tolerates.   Hypertension/volume  - Blood pressure elevated.  Usually takes midodrine '10mg'$  pre HD > started '10mg'$  tid here. Continue home meds-  coreg 12.5, losartan 50 , others as needed.... not sure that she needs midodrine ???  Will follow for now  Anemia of CKD - Hgb 9.6.  Will hold iron in setting of acute infection.  ESA due 8/1-  hgb over 10-  will hold  Secondary Hyperparathyroidism -  Ca in goal. Phos high, not unusual. Continue VDRA and phoslo 2 AC TID   Nutrition - Renal diet w/fluid restrictions     KLouis Meckel 04/09/2021, 8:10 AM   Recent Labs  Lab 04/07/21 1340 04/08/21 0313 04/09/21 0341  K 3.7 3.1* 2.9*  BUN 63* 25* 68*  CREATININE 5.14* 2.56* 5.04*  CALCIUM 8.3* 9.0 8.7*  PHOS 8.1*  --   --   HGB 10.0* 11.6* 10.7*   Inpatient medications:  vitamin C  500 mg Oral Daily   aspirin  325 mg Oral Daily   atorvastatin  40 mg Oral q1800   calcium acetate  1,334 mg Oral TID WC   carvedilol  12.5 mg Oral BID WC   Chlorhexidine Gluconate Cloth  6 each Topical Q0600   FLUoxetine  20 mg Oral Daily   gabapentin  100 mg Oral QHS   heparin  5,000 Units Subcutaneous Q8H   heparin  5,000 Units Dialysis Once in dialysis   insulin aspart  0-20 Units Subcutaneous TID WC   insulin aspart  0-5 Units Subcutaneous QHS   Ipratropium-Albuterol  1 puff Inhalation TID   losartan  50 mg Oral QPM   midodrine  10 mg Oral TID WC   multivitamin with minerals  1 tablet Oral Daily   predniSONE  50 mg Oral Daily  zinc sulfate  220 mg Oral Daily    sodium chloride     sodium chloride     remdesivir 200 mg in sodium chloride 0.9% 250 mL IVPB     Followed by   remdesivir 100 mg in NS 100 mL Stopped (04/07/21 1344)   sodium chloride, sodium chloride, acetaminophen, albuterol, alteplase, guaiFENesin-dextromethorphan, heparin, heparin, hydrALAZINE, labetalol, lidocaine (PF), lidocaine-prilocaine, melatonin, ondansetron (ZOFRAN) IV, pentafluoroprop-tetrafluoroeth, polyethylene glycol

## 2021-04-09 NOTE — Plan of Care (Signed)
  Problem: Education: Goal: Knowledge of General Education information will improve Description: Including pain rating scale, medication(s)/side effects and non-pharmacologic comfort measures 04/09/2021 1119 by Fuller Plan, RN Outcome: Progressing 04/09/2021 1118 by Armel Rabbani, Otila Back, RN Outcome: Progressing   Problem: Health Behavior/Discharge Planning: Goal: Ability to manage health-related needs will improve Outcome: Progressing   Problem: Clinical Measurements: Goal: Ability to maintain clinical measurements within normal limits will improve Outcome: Progressing Goal: Will remain free from infection Outcome: Progressing Goal: Diagnostic test results will improve Outcome: Progressing Goal: Respiratory complications will improve Outcome: Progressing Goal: Cardiovascular complication will be avoided Outcome: Progressing   Problem: Activity: Goal: Risk for activity intolerance will decrease Outcome: Progressing   Problem: Nutrition: Goal: Adequate nutrition will be maintained Outcome: Progressing   Problem: Coping: Goal: Level of anxiety will decrease Outcome: Progressing   Problem: Elimination: Goal: Will not experience complications related to bowel motility Outcome: Progressing Goal: Will not experience complications related to urinary retention Outcome: Progressing   Problem: Pain Managment: Goal: General experience of comfort will improve Outcome: Progressing   Problem: Safety: Goal: Ability to remain free from injury will improve Outcome: Progressing   Problem: Skin Integrity: Goal: Risk for impaired skin integrity will decrease Outcome: Progressing   Problem: Education: Goal: Knowledge of risk factors and measures for prevention of condition will improve Outcome: Progressing   Problem: Coping: Goal: Psychosocial and spiritual needs will be supported Outcome: Progressing   Problem: Respiratory: Goal: Will maintain a patent airway Outcome:  Progressing Goal: Complications related to the disease process, condition or treatment will be avoided or minimized Outcome: Progressing

## 2021-04-10 DIAGNOSIS — N186 End stage renal disease: Secondary | ICD-10-CM

## 2021-04-10 DIAGNOSIS — Z992 Dependence on renal dialysis: Secondary | ICD-10-CM

## 2021-04-10 DIAGNOSIS — J9621 Acute and chronic respiratory failure with hypoxia: Secondary | ICD-10-CM | POA: Diagnosis not present

## 2021-04-10 DIAGNOSIS — U071 COVID-19: Secondary | ICD-10-CM | POA: Diagnosis not present

## 2021-04-10 LAB — CBC WITH DIFFERENTIAL/PLATELET
Abs Immature Granulocytes: 0.09 10*3/uL — ABNORMAL HIGH (ref 0.00–0.07)
Basophils Absolute: 0 10*3/uL (ref 0.0–0.1)
Basophils Relative: 0 %
Eosinophils Absolute: 0 10*3/uL (ref 0.0–0.5)
Eosinophils Relative: 0 %
HCT: 35.6 % — ABNORMAL LOW (ref 36.0–46.0)
Hemoglobin: 10.8 g/dL — ABNORMAL LOW (ref 12.0–15.0)
Immature Granulocytes: 1 %
Lymphocytes Relative: 14 %
Lymphs Abs: 1 10*3/uL (ref 0.7–4.0)
MCH: 27 pg (ref 26.0–34.0)
MCHC: 30.3 g/dL (ref 30.0–36.0)
MCV: 89 fL (ref 80.0–100.0)
Monocytes Absolute: 0.6 10*3/uL (ref 0.1–1.0)
Monocytes Relative: 8 %
Neutro Abs: 5.7 10*3/uL (ref 1.7–7.7)
Neutrophils Relative %: 77 %
Platelets: 194 10*3/uL (ref 150–400)
RBC: 4 MIL/uL (ref 3.87–5.11)
RDW: 18.3 % — ABNORMAL HIGH (ref 11.5–15.5)
WBC: 7.4 10*3/uL (ref 4.0–10.5)
nRBC: 0 % (ref 0.0–0.2)

## 2021-04-10 LAB — GLUCOSE, CAPILLARY
Glucose-Capillary: 209 mg/dL — ABNORMAL HIGH (ref 70–99)
Glucose-Capillary: 109 mg/dL — ABNORMAL HIGH (ref 70–99)
Glucose-Capillary: 162 mg/dL — ABNORMAL HIGH (ref 70–99)
Glucose-Capillary: 190 mg/dL — ABNORMAL HIGH (ref 70–99)
Glucose-Capillary: 361 mg/dL — ABNORMAL HIGH (ref 70–99)

## 2021-04-10 LAB — C-REACTIVE PROTEIN: CRP: 4 mg/dL — ABNORMAL HIGH (ref <1.0)

## 2021-04-10 LAB — COMPREHENSIVE METABOLIC PANEL
ALT: 14 U/L (ref 0–44)
AST: 13 U/L — ABNORMAL LOW (ref 15–41)
Albumin: 2.8 g/dL — ABNORMAL LOW (ref 3.5–5.0)
Alkaline Phosphatase: 59 U/L (ref 38–126)
Anion gap: 11 (ref 5–15)
BUN: 33 mg/dL — ABNORMAL HIGH (ref 8–23)
CO2: 26 mmol/L (ref 22–32)
Calcium: 8.5 mg/dL — ABNORMAL LOW (ref 8.9–10.3)
Chloride: 101 mmol/L (ref 98–111)
Creatinine, Ser: 3.28 mg/dL — ABNORMAL HIGH (ref 0.44–1.00)
GFR, Estimated: 15 mL/min — ABNORMAL LOW (ref >60)
Glucose, Bld: 114 mg/dL — ABNORMAL HIGH (ref 70–99)
Potassium: 3.3 mmol/L — ABNORMAL LOW (ref 3.5–5.1)
Sodium: 138 mmol/L (ref 135–145)
Total Bilirubin: 0.3 mg/dL (ref 0.3–1.2)
Total Protein: 6 g/dL — ABNORMAL LOW (ref 6.5–8.1)

## 2021-04-10 LAB — BRAIN NATRIURETIC PEPTIDE: B Natriuretic Peptide: 2681.5 pg/mL — ABNORMAL HIGH (ref 0.0–100.0)

## 2021-04-10 LAB — D-DIMER, QUANTITATIVE: D-Dimer, Quant: 2.19 ug/mL-FEU — ABNORMAL HIGH (ref 0.00–0.50)

## 2021-04-10 LAB — HEPATITIS B SURFACE ANTIGEN: Hepatitis B Surface Ag: NONREACTIVE

## 2021-04-10 MED ORDER — BARICITINIB 2 MG PO TABS: 4.0000 mg | ORAL_TABLET | Freq: Every day | ORAL | Status: DC

## 2021-04-10 MED ORDER — TOCILIZUMAB 400 MG/20ML IV SOLN
600.0000 mg | Freq: Once | INTRAVENOUS | Status: AC
  Administered 2021-04-10: 600 mg via INTRAVENOUS
  Filled 2021-04-10: qty 10

## 2021-04-10 MED ORDER — SODIUM CHLORIDE 0.9 % IV SOLN
100.0000 mg | Freq: Every day | INTRAVENOUS | Status: DC
  Filled 2021-04-10: qty 20

## 2021-04-10 MED ORDER — METHYLPREDNISOLONE SODIUM SUCC 125 MG IJ SOLR
80.0000 mg | Freq: Two times a day (BID) | INTRAMUSCULAR | Status: DC
  Administered 2021-04-10 – 2021-04-12 (×5): 80 mg via INTRAVENOUS
  Filled 2021-04-10 (×5): qty 2

## 2021-04-10 MED ORDER — CHLORHEXIDINE GLUCONATE CLOTH 2 % EX PADS
6.0000 | MEDICATED_PAD | Freq: Every day | CUTANEOUS | Status: DC
  Administered 2021-04-11 – 2021-04-12 (×2): 6 via TOPICAL

## 2021-04-10 NOTE — Progress Notes (Addendum)
Patient ID: Tina Gomez, female   DOB: [birth year], 65 y.o.   MRN: [id number]  PROGRESS NOTE    Tina Gomez  [id number] DOB: [birth year] DOA: 04/04/2021 PCP: Charlott Rakes, MD   Brief Narrative:  65 y.o. female with medical history significant for type 2 diabetes mellitus ESRD on HD MWF, COPD, chronic hypoxia on 4 L nasal cannula at baseline, hypertension, hyperlipidemia, anemia of chronic kidney disease, hyperlipidemia, chronic anxiety/depression presented on 04/04/2021 with fever, cough and shortness of breath.  On presentation, her saturations were in the 80s on 4 L nasal cannula.  Chest x-ray showed bilateral pulmonary opacities, multifocal pneumonia.  COVID testing was positive.  She was admitted for acute on chronic hypoxic respite failure secondary to COVID-19 pneumonia.  She was started on IV Solu-Medrol.  She has refused remdesivir during this hospitalization.  Nephrology was consulted.  She was transferred to ICU on 04/07/2021 because of worsening hypoxic respiratory failure.  She underwent dialysis in ICU.  She was transferred back to [facility] service from 04/10/2021 onwards.  Assessment & Plan:   Acute on chronic hypoxic respiratory failure in the setting of bilateral pulmonary infiltrates/COVID-pneumonia and pulmonary edema volume overload with superimposed COPD/ongoing tobacco abuse -Patient normally wears 4 L oxygen via nasal cannula at baseline.  Oxygen requirement worsened during the hospitalization and patient was transferred to ICU on 04/07/2021.  She was transferred back to [facility] service from 04/10/2021 onwards. -Still on 11 L oxygen via high flow nasal cannula.  Wean off as able.  COVID-19 pneumonia -Respiratory status as above. -Monitor daily inflammatory markers. COVID-19 Labs  Recent Labs    04/08/21 0313 04/09/21 0341 04/10/21 0424  DDIMER 1.46* 1.39* 2.19*  CRP 1.2* 1.5* 4.0*    Lab Results  Component Value Date   SARSCOV2NAA POSITIVE (A) 04/04/2021   SARSCOV2NAA  NEGATIVE 12/31/2020   SARSCOV2NAA NEGATIVE 12/13/2020   [place] NEGATIVE 11/28/2020    -Started initially on Solu-Medrol and subsequently switched to oral prednisone.   -will switch her back to Solu-Medrol 80 mg IV every 12 hours for now because of tenuous respiratory status. -Patient has refused remdesivir since admission. Will give a dose of Actemra  End-stage renal disease on hemodialysis -Dialysis as per nephrology schedule.  Anemia of chronic disease -Hemoglobin stable.  Monitor  Diabetes mellitus type 2 with hyperglycemia -Continue CBGs with SSI  Hypertension -Monitor blood pressure.  Continue Coreg, losartan  Hyperlipidemia -Continue statin  Generalized deconditioning - will need home health PT. -Patient is still requiring very high amount of oxygen.  Overall prognosis is guarded to poor.  Palliative care consultation for goals of care discussion.   DVT prophylaxis: Heparin Code Status: Full Family Communication: None at bedside Disposition Plan: Status is: Inpatient  Remains inpatient appropriate because:Inpatient level of care appropriate due to severity of illness  Dispo:  Patient From: Home  Planned Disposition: Home  Medically stable for discharge: No    Consultants: Nephrology/PCCM  Procedures: None  Antimicrobials: None   Subjective: Patient seen and examined at bedside.  Does not feel well.  Complains of chest hurting due to cough and shortness of breath.  Vomited this morning.  No overnight fever reported.  Objective: Vitals:   04/10/21 0121 04/10/21 0126 04/10/21 0641 04/10/21 0805  BP: (!) 162/54  (!) 158/57 137/60  Pulse:    (!) 59  Resp: '16  18 18  '$ Temp: 98.2 F (36.8 C)  98.9 F (37.2 C) 99.1 F (37.3 C)  TempSrc: Oral  Oral Oral  SpO2: (!) 88% 90% 90% 90%  Weight: 73.6 kg     Height: '5\' 3"'$  (1.6 m)       Intake/Output Summary (Last 24 hours) at 04/10/2021 1106 Last data filed at 04/10/2021 0645 Gross per 24 hour  Intake 530  ml  Output 2700 ml  Net -2170 ml   Filed Weights   04/07/21 0804 04/09/21 0419 04/10/21 0121  Weight: 78.7 kg 76.2 kg 73.6 kg    Examination:  General exam: Looks chronically ill.  Currently on 11 L oxygen via high flow nasal cannula  respiratory system: Bilateral decreased breath sounds at bases with scattered crackles Cardiovascular system: S1 & S2 heard, mildly bradycardic intermittently  gastrointestinal system: Abdomen is nondistended, soft and nontender. Normal bowel sounds heard. Extremities: No cyanosis, clubbing; bilateral lower extremity edema present Central nervous system: Alert and oriented. No focal neurological deficits. Moving extremities Skin: No rashes, lesions or ulcers Psychiatry: Mostly flat affect and intermittently gets anxious.   Data Reviewed: I have personally reviewed following labs and imaging studies  CBC: Recent Labs  Lab 04/06/21 0308 04/07/21 0221 04/07/21 1340 04/08/21 0313 04/09/21 0341 04/10/21 0424  WBC 8.2 10.4 7.3 8.3 9.7 7.4  NEUTROABS 7.2 9.4*  --  7.5 7.5 5.7  HGB 10.5* 9.8* 10.0* 11.6* 10.7* 10.8*  HCT 36.0 34.0* 33.7* 38.5 35.9* 35.6*  MCV 91.8 92.6 92.8 88.5 89.8 89.0  PLT 217 219 210 236 208 [id number]   Basic Metabolic Panel: Recent Labs  Lab 04/07/21 0221 04/07/21 1340 04/08/21 0313 04/09/21 0341 04/10/21 0424  NA 136 136 135 138 138  K 4.1 3.7 3.1* 2.9* 3.3*  CL 95* 97* 95* 100 101  CO2 '26 23 27 25 26  '$ GLUCOSE 246* 155* 182* 155* 114*  BUN 50* 63* 25* 68* 33*  CREATININE 4.88* 5.14* 2.56* 5.04* 3.28*  CALCIUM 8.3* 8.3* 9.0 8.7* 8.5*  MG  --   --   --  2.6*  --   PHOS  --  8.1*  --   --   --    GFR: Estimated Creatinine Clearance: 16.7 mL/min (A) (by C-G formula based on SCr of 3.28 mg/dL (H)). Liver Function Tests: Recent Labs  Lab 04/06/21 0308 04/07/21 0221 04/07/21 1340 04/08/21 0313 04/09/21 0341 04/10/21 0424  AST 17 16  --  19 14* 13*  ALT 11 13  --  '18 13 14  '$ ALKPHOS 66 67  --  81 72 59  BILITOT  0.7 0.2*  --  0.5 0.3 0.3  PROT 6.5 6.1*  --  7.7 5.9* 6.0*  ALBUMIN 3.1* 2.9* 2.9* 3.6 2.8* 2.8*   No results for input(s): LIPASE, AMYLASE in the last 168 hours. No results for input(s): AMMONIA in the last 168 hours. Coagulation Profile: No results for input(s): INR, PROTIME in the last 168 hours. Cardiac Enzymes: No results for input(s): CKTOTAL, CKMB, CKMBINDEX, TROPONINI in the last 168 hours. BNP (last 3 results) No results for input(s): PROBNP in the last 8760 hours. HbA1C: No results for input(s): HGBA1C in the last 72 hours. CBG: Recent Labs  Lab 04/09/21 0618 04/09/21 1041 04/09/21 1609 04/09/21 2152 04/10/21 0637  GLUCAP 126* 123* 131* 209* 109*   Lipid Profile: No results for input(s): CHOL, HDL, LDLCALC, TRIG, CHOLHDL, LDLDIRECT in the last 72 hours. Thyroid Function Tests: No results for input(s): TSH, T4TOTAL, FREET4, T3FREE, THYROIDAB in the last 72 hours. Anemia Panel: No results for input(s): VITAMINB12, FOLATE, FERRITIN, TIBC, IRON, RETICCTPCT in the last 72 hours.  Sepsis Labs: Recent Labs  Lab 04/05/21 0339  PROCALCITON 0.30    Recent Results (from the past 240 hour(s))  Resp Panel by RT-PCR (Flu A&B, Covid) Nasopharyngeal Swab     Status: Abnormal   Collection Time: 04/04/21  8:55 PM   Specimen: Nasopharyngeal Swab; Nasopharyngeal(NP) swabs in vial transport medium  Result Value Ref Range Status   SARS Coronavirus 2 by RT PCR POSITIVE (A) NEGATIVE Final    Comment: RESULT CALLED TO, READ BACK BY AND VERIFIED WITH: SEEARNARS RN, AT 2334 04/04/21 D. VANHOOK (NOTE) SARS-CoV-2 target nucleic acids are DETECTED.  The SARS-CoV-2 RNA is generally detectable in upper respiratory specimens during the acute phase of infection. Positive results are indicative of the presence of the identified virus, but do not rule out bacterial infection or co-infection with other pathogens not detected by the test. Clinical correlation with patient history and other  diagnostic information is necessary to determine patient infection status. The expected result is Negative.  Fact Sheet for Patients: EntrepreneurPulse.com.au  Fact Sheet for Healthcare Providers: IncredibleEmployment.be  This test is not yet approved or cleared by the [place] FDA and  has been authorized for detection and/or diagnosis of SARS-CoV-2 by FDA under an Emergency Use Authorization (EUA).  This EUA will remain in effect (meaning this test  can be used) for the duration of  the COVID-19 declaration under Section 564(b)(1) of the Act, 21 U.S.C. section 360bbb-3(b)(1), unless the authorization is terminated or revoked sooner.     Influenza A by PCR NEGATIVE NEGATIVE Final   Influenza B by PCR NEGATIVE NEGATIVE Final    Comment: (NOTE) The Xpert Xpress SARS-CoV-2/FLU/RSV plus assay is intended as an aid in the diagnosis of influenza from Nasopharyngeal swab specimens and should not be used as a sole basis for treatment. Nasal washings and aspirates are unacceptable for Xpert Xpress SARS-CoV-2/FLU/RSV testing.  Fact Sheet for Patients: EntrepreneurPulse.com.au  Fact Sheet for Healthcare Providers: IncredibleEmployment.be  This test is not yet approved or cleared by the [place] FDA and has been authorized for detection and/or diagnosis of SARS-CoV-2 by FDA under an Emergency Use Authorization (EUA). This EUA will remain in effect (meaning this test can be used) for the duration of the COVID-19 declaration under Section 564(b)(1) of the Act, 21 U.S.C. section 360bbb-3(b)(1), unless the authorization is terminated or revoked.  Performed at [facility], [place] [address] [place], [place] [address]          Radiology Studies: No results found.      Scheduled Meds:  vitamin C  500 mg Oral Daily   aspirin  325 mg Oral Daily   atorvastatin  40 mg Oral q1800   calcium  acetate  1,334 mg Oral TID WC   carvedilol  6.25 mg Oral BID WC   Chlorhexidine Gluconate Cloth  6 each Topical Q0600   doxercalciferol  1 mcg Intravenous Q M,W,F-HD   FLUoxetine  20 mg Oral Daily   gabapentin  100 mg Oral QHS   heparin  5,000 Units Subcutaneous Q8H   insulin aspart  0-20 Units Subcutaneous TID WC   insulin aspart  0-5 Units Subcutaneous QHS   losartan  25 mg Oral QPM   multivitamin with minerals  1 tablet Oral Daily   predniSONE  50 mg Oral Daily   zinc sulfate  220 mg Oral Daily   Continuous Infusions:  sodium chloride     sodium chloride     remdesivir 100 mg in NS  100 mL            Aline August, MD [facility] 04/10/2021, 11:06 AM

## 2021-04-10 NOTE — Progress Notes (Signed)
[facility] Progress Note  Subjective: HD last night-  removed 2700-  ending weight 73.6 (OP weight was 77)-  feels lousy -  chest hurting from coughing feeling like she cant get her breath   Vitals:   04/09/21 2345 04/10/21 0121 04/10/21 0126 04/10/21 0641  BP: (!) 137/52 (!) 162/54  (!) 158/57  Pulse:      Resp: '18 16  18  '$ Temp:  98.2 F (36.8 C)  98.9 F (37.2 C)  TempSrc:  Oral  Oral  SpO2: 91% (!) 88% 90% 90%  Weight:  73.6 kg    Height:  '5\' 3"'$  (1.6 m)      Exam: General: chronically ill appearing female in NAD Head: NCAT sclera not icteric MMM Neck: Supple. No lymphadenopathy Lungs: mostly clear, occ crackles Heart: RRR. No murmur, rubs or gallops. Abdomen: soft, nontender, +BS, no guarding, no rebound tenderness M/S:  Equal strength b/l in upper and lower extremities. Lower extremities:no edema, ischemic changes, or open wounds Neuro: AAOx3. Moves all extremities spontaneously. Psych:  Responds to questions appropriately with a normal affect. Dialysis Access: LU AVF +b/t   Dialysis:  MWF [facility]  3h  400/500  77kg  3K/2.5Ca bath  LUA AVF  Hep 5000  Mircera 225 mcg q2wks - last 7/18 Venofer '100mg'$  qHD x10 (2 completed) Hectorol 40mg IV qHD   Assessment/Plan:  COVID PNA- tested positive on 7/27. Per PMD-   remdesivir and steroids  Acute on chronic respiratory failure - 2/2 #1 & likely superimposed by bacterial PNA and vol excess/ pulm edema. On 4L O2 at home.   O2 seems stalled on high flow [place]  ESRD -  on HD MWFSat for 3h. Good HD Sat night, 3L UF.  HD again yesterday, cont to lower vol as tolerates. Plan again for tomorrow (Wed)  Hypertension/volume  - Blood pressure elevated.  Usually takes midodrine '10mg'$  pre HD > started '10mg'$  tid here. Continue home meds-  coreg 6.25, losartan 25-  doses decreased, others as needed.... not sure that she needs midodrine - will stop it.  Will need lower EDW at discharge  Anemia of CKD -  Will hold iron in setting  of acute infection.  ESA due 8/1-  hgb over 10-  will hold for now  Secondary Hyperparathyroidism -  Ca in goal. Phos high, not unusual. Continue VDRA (hect) and phoslo 2 AC TID  Nutrition - Renal diet w/fluid restrictions     KLouis Meckel 04/10/2021, 8:00 AM   Recent Labs  Lab 04/07/21 1340 04/08/21 0313 04/09/21 0341 04/10/21 0424  K 3.7   < > 2.9* 3.3*  BUN 63*   < > 68* 33*  CREATININE 5.14*   < > 5.04* 3.28*  CALCIUM 8.3*   < > 8.7* 8.5*  PHOS 8.1*  --   --   --   HGB 10.0*   < > 10.7* 10.8*   < > = values in this interval not displayed.   Inpatient medications:  vitamin C  500 mg Oral Daily   aspirin  325 mg Oral Daily   atorvastatin  40 mg Oral q1800   calcium acetate  1,334 mg Oral TID WC   carvedilol  6.25 mg Oral BID WC   Chlorhexidine Gluconate Cloth  6 each Topical Q0600   doxercalciferol  1 mcg Intravenous Q M,W,F-HD   FLUoxetine  20 mg Oral Daily   gabapentin  100 mg Oral QHS   heparin  5,000 Units  Subcutaneous Q8H   heparin  5,000 Units Dialysis Once in dialysis   insulin aspart  0-20 Units Subcutaneous TID WC   insulin aspart  0-5 Units Subcutaneous QHS   losartan  25 mg Oral QPM   midodrine  10 mg Oral Q M,W,F-HD   multivitamin with minerals  1 tablet Oral Daily   predniSONE  50 mg Oral Daily   zinc sulfate  220 mg Oral Daily    sodium chloride     sodium chloride     remdesivir 200 mg in sodium chloride 0.9% 250 mL IVPB     Followed by   remdesivir 100 mg in NS 100 mL Stopped (04/07/21 1344)   sodium chloride, sodium chloride, acetaminophen, albuterol, alteplase, guaiFENesin-dextromethorphan, heparin, heparin, hydrALAZINE, labetalol, lidocaine (PF), lidocaine-prilocaine, melatonin, ondansetron (ZOFRAN) IV, pentafluoroprop-tetrafluoroeth, polyethylene glycol

## 2021-04-10 NOTE — Plan of Care (Signed)

## 2021-04-10 NOTE — Progress Notes (Signed)
Pt has been refusing remdesivir. We will give a dose of Actemra instead along with her steroids per Dr Starla Link.  Onnie Boer, PharmD, BCIDP, AAHIVP, CPP Infectious Disease Pharmacist 04/10/2021 1:54 PM

## 2021-04-11 ENCOUNTER — Encounter (HOSPITAL_COMMUNITY): Payer: Self-pay | Admitting: Internal Medicine

## 2021-04-11 DIAGNOSIS — Z7189 Other specified counseling: Secondary | ICD-10-CM

## 2021-04-11 DIAGNOSIS — J9621 Acute and chronic respiratory failure with hypoxia: Secondary | ICD-10-CM | POA: Diagnosis not present

## 2021-04-11 DIAGNOSIS — I10 Essential (primary) hypertension: Secondary | ICD-10-CM

## 2021-04-11 DIAGNOSIS — Z515 Encounter for palliative care: Secondary | ICD-10-CM | POA: Diagnosis not present

## 2021-04-11 DIAGNOSIS — U071 COVID-19: Secondary | ICD-10-CM | POA: Diagnosis not present

## 2021-04-11 DIAGNOSIS — N186 End stage renal disease: Secondary | ICD-10-CM | POA: Diagnosis not present

## 2021-04-11 LAB — COMPREHENSIVE METABOLIC PANEL
ALT: 12 U/L (ref 0–44)
AST: 13 U/L — ABNORMAL LOW (ref 15–41)
Albumin: 3 g/dL — ABNORMAL LOW (ref 3.5–5.0)
Alkaline Phosphatase: 59 U/L (ref 38–126)
Anion gap: 14 (ref 5–15)
BUN: 62 mg/dL — ABNORMAL HIGH (ref 8–23)
CO2: 27 mmol/L (ref 22–32)
Calcium: 9.2 mg/dL (ref 8.9–10.3)
Chloride: 95 mmol/L — ABNORMAL LOW (ref 98–111)
Creatinine, Ser: 5.02 mg/dL — ABNORMAL HIGH (ref 0.44–1.00)
GFR, Estimated: 9 mL/min — ABNORMAL LOW (ref >60)
Glucose, Bld: 199 mg/dL — ABNORMAL HIGH (ref 70–99)
Potassium: 4.1 mmol/L (ref 3.5–5.1)
Sodium: 136 mmol/L (ref 135–145)
Total Bilirubin: 0.6 mg/dL (ref 0.3–1.2)
Total Protein: 6.4 g/dL — ABNORMAL LOW (ref 6.5–8.1)

## 2021-04-11 LAB — C-REACTIVE PROTEIN: CRP: 11.4 mg/dL — ABNORMAL HIGH (ref <1.0)

## 2021-04-11 LAB — CBC WITH DIFFERENTIAL/PLATELET
Abs Immature Granulocytes: 0.1 10*3/uL — ABNORMAL HIGH (ref 0.00–0.07)
Basophils Absolute: 0 10*3/uL (ref 0.0–0.1)
Basophils Relative: 0 %
Eosinophils Absolute: 0 10*3/uL (ref 0.0–0.5)
Eosinophils Relative: 0 %
HCT: 36.5 % (ref 36.0–46.0)
Hemoglobin: 10.9 g/dL — ABNORMAL LOW (ref 12.0–15.0)
Immature Granulocytes: 2 %
Lymphocytes Relative: 10 %
Lymphs Abs: 0.5 10*3/uL — ABNORMAL LOW (ref 0.7–4.0)
MCH: 27.3 pg (ref 26.0–34.0)
MCHC: 29.9 g/dL — ABNORMAL LOW (ref 30.0–36.0)
MCV: 91.3 fL (ref 80.0–100.0)
Monocytes Absolute: 0.3 10*3/uL (ref 0.1–1.0)
Monocytes Relative: 5 %
Neutro Abs: 4.4 10*3/uL (ref 1.7–7.7)
Neutrophils Relative %: 83 %
Platelets: 199 10*3/uL (ref 150–400)
RBC: 4 MIL/uL (ref 3.87–5.11)
RDW: 18.3 % — ABNORMAL HIGH (ref 11.5–15.5)
WBC: 5.4 10*3/uL (ref 4.0–10.5)
nRBC: 0 % (ref 0.0–0.2)

## 2021-04-11 LAB — GLUCOSE, CAPILLARY
Glucose-Capillary: 170 mg/dL — ABNORMAL HIGH (ref 70–99)
Glucose-Capillary: 357 mg/dL — ABNORMAL HIGH (ref 70–99)
Glucose-Capillary: 253 mg/dL — ABNORMAL HIGH (ref 70–99)

## 2021-04-11 LAB — D-DIMER, QUANTITATIVE: D-Dimer, Quant: 1.06 ug/mL-FEU — ABNORMAL HIGH (ref 0.00–0.50)

## 2021-04-11 MED ORDER — DOXERCALCIFEROL 4 MCG/2ML IV SOLN
INTRAVENOUS | Status: AC
  Administered 2021-04-11: 1 ug via INTRAVENOUS
  Filled 2021-04-11: qty 2

## 2021-04-11 NOTE — Progress Notes (Signed)
Occupational Therapy Treatment Patient Details Name: Tina Gomez MRN: [id number] DOB: 1955/10/03 Today's Date: 04/11/2021    History of present illness 65 y.o. female presents to [facility] ED on 04/04/2021 with fever, cough, SOB. Pt found to be COVID+. Chest x-ray revealing bilateral pulmonary opacities. PMHx: ESRD on HD MWF, COPD, chronic hypoxia on 4 L nasal cannula at baseline, hypertension, hyperlipidemia, anemia of chronic kidney disease, chronic anxiety/depression.   OT comments  Pt progressing gradually towards OT goals, reports feeling better than on admission. Pt able to demo basic LB ADLs with Setup assist and began education on UE HEP with good carryover noted. Plan to further comprehensive education on UE HEP to maximize strength. Plan to also progress mobility with O2 caddy as pt has been titrated from [place] to HFNC.   SpO2 >96% on 10 L HFNC HR 50s sustained   Follow Up Recommendations  Home health OT;Supervision - Intermittent    Equipment Recommendations  None recommended by OT    Recommendations for Other Services      Precautions / Restrictions Precautions Precautions: Fall;Other (comment) Precaution Comments: monitor O2, COVID Restrictions Weight Bearing Restrictions: No       Mobility Bed Mobility Overal bed mobility: Modified Independent Bed Mobility: Supine to Sit     Supine to sit: Modified independent (Device/Increase time)     General bed mobility comments: no assist needed, increased time and use of bedrail    Transfers                      Balance Overall balance assessment: Needs assistance Sitting-balance support: No upper extremity supported;Feet supported Sitting balance-Leahy Scale: Good                                     ADL either performed or assessed with clinical judgement   ADL Overall ADL's : Needs assistance/impaired                     Lower Body Dressing: Set up;Sit to/from stand Lower Body  Dressing Details (indicate cue type and reason): setup to don socks sitting EOB, bringing feet to self               General ADL Comments: Pt reports feeling the best she has felt since admission, still minor deficits in strength and endurance. Decreasing O2 demands     Vision   Vision Assessment?: No apparent visual deficits   Perception     Praxis      Cognition Arousal/Alertness: Awake/alert Behavior During Therapy: WFL for tasks assessed/performed Overall Cognitive Status: Within Functional Limits for tasks assessed                                          Exercises Exercises: General Upper Extremity;Other exercises General Exercises - Upper Extremity Shoulder Flexion: Strengthening;Both;10 reps;Seated;Theraband Theraband Level (Shoulder Flexion): Level 1 (Yellow) Shoulder Horizontal ABduction: Strengthening;Both;10 reps;Seated;Theraband Theraband Level (Shoulder Horizontal Abduction): Level 1 (Yellow) Other Exercises Other Exercises: IS x 10 with max up to 106m   Shoulder Instructions       General Comments SpO2 96% and above on 10 L HFNC, HR 56-58bpm    Pertinent Vitals/ Pain       Pain Assessment: No/denies pain  Home Living  Prior Functioning/Environment              Frequency  Min 2X/week        Progress Toward Goals  OT Goals(current goals can now be found in the care plan section)  Progress towards OT goals: Progressing toward goals  Acute Rehab OT Goals Patient Stated Goal: to reduce oxygen needs and go home OT Goal Formulation: With patient Time For Goal Achievement: 04/20/21 Potential to Achieve Goals: Good ADL Goals Pt Will Transfer to Toilet: with modified independence;ambulating Pt/caregiver will Perform Home Exercise Program: Increased strength;Both right and left upper extremity;With theraband;Independently;With written HEP provided Additional ADL  Goal #1: Pt to increase standing activity tolerance > 5-7 min during daily tasks to maximize endurance  Plan Frequency remains appropriate;Discharge plan needs to be updated    Co-evaluation                 AM-PAC OT "6 Clicks" Daily Activity     Outcome Measure   Help from another person eating meals?: None Help from another person taking care of personal grooming?: A Little Help from another person toileting, which includes using toliet, bedpan, or urinal?: A Little Help from another person bathing (including washing, rinsing, drying)?: A Little Help from another person to put on and taking off regular upper body clothing?: A Little Help from another person to put on and taking off regular lower body clothing?: A Little 6 Click Score: 19    End of Session Equipment Utilized During Treatment: Oxygen  OT Visit Diagnosis: Unsteadiness on feet (R26.81);Muscle weakness (generalized) (M62.81)   Activity Tolerance Patient tolerated treatment well   Patient Left in bed;with call bell/phone within reach   Nurse Communication Mobility status        Time: [id number] OT Time Calculation (min): 26 min  Charges: OT General Charges $OT Visit: 1 Visit OT Treatments $Self Care/Home Management : 8-22 mins $Therapeutic Exercise: 8-22 mins  Malachy Chamber, OTR/L Acute Rehab Services Office: [phone]    Layla Maw 04/11/2021, 12:57 PM

## 2021-04-11 NOTE — Progress Notes (Signed)
Progress Note    Tina Gomez  [id number] DOB: [birth year]  DOA: 04/04/2021 PCP: Charlott Rakes, MD      Brief Narrative:    Medical records reviewed and are as summarized below:  Tina Gomez is a 65 y.o. female with medical history significant for type 2 diabetes mellitus ESRD on HD MWF, COPD, chronic hypoxia on 4 L nasal cannula at baseline, hypertension, hyperlipidemia, anemia of chronic kidney disease, hyperlipidemia, chronic anxiety/depression presented on 04/04/2021 with fever, cough and shortness of breath.  On presentation, her saturations were in the 80s on 4 L nasal cannula.  Chest x-ray showed bilateral pulmonary opacities, multifocal pneumonia.  COVID testing was positive.  She was admitted to the hospital for acute on chronic hypoxic respiratory failure secondary to COVID-19 pneumonia.  She was treated with steroids and required high flow oxygen.  She refused remdesivir.  She was also treated with Actemra.  Nephrologist was consulted for hemodialysis.  She was transferred to [facility] service 04/10/2021.      Assessment/Plan:   Active Problems:   Type 2 diabetes mellitus with diabetic neuropathy, unspecified (HCC)   Essential hypertension, benign   Acute on chronic respiratory failure with hypoxia (HCC)   Acute respiratory disease due to COVID-19 virus   ESRD on hemodialysis (HCC)   Generalized weakness    Body mass index is 28.9 kg/m.   Acute on chronic hypoxic respiratory failure: He is 11 L/min oxygen via high flow nasal cannula.  She uses 4 L/min oxygen at baseline.  Taper down oxygen as able.  COVID-19 bilateral pneumonia: S/p treatment with Actemra on 04/10/2021.  Continue IV Solu-Medrol.  She refused remdesivir.  Hypertension: Continue antihypertensives  COPD: Continue bronchodilators  ESRD: Follow-up with nephrologist for hemodialysis.   Diet Order             Diet renal with fluid restriction Fluid restriction: 1200 mL Fluid; Room service  appropriate? Yes; Fluid consistency: Thin  Diet effective now                      Consultants: Intensivist Nephrologist  Procedures: None    Medications:    vitamin C  500 mg Oral Daily   aspirin  325 mg Oral Daily   atorvastatin  40 mg Oral q1800   calcium acetate  1,334 mg Oral TID WC   carvedilol  6.25 mg Oral BID WC   Chlorhexidine Gluconate Cloth  6 each Topical Q0600   Chlorhexidine Gluconate Cloth  6 each Topical Q0600   doxercalciferol  1 mcg Intravenous Q M,W,F-HD   FLUoxetine  20 mg Oral Daily   gabapentin  100 mg Oral QHS   heparin  5,000 Units Subcutaneous Q8H   insulin aspart  0-20 Units Subcutaneous TID WC   insulin aspart  0-5 Units Subcutaneous QHS   losartan  25 mg Oral QPM   methylPREDNISolone (SOLU-MEDROL) injection  80 mg Intravenous Q12H   multivitamin with minerals  1 tablet Oral Daily   zinc sulfate  220 mg Oral Daily   Continuous Infusions:  sodium chloride     sodium chloride       Anti-infectives (From admission, onward)    Start     Dose/Rate Route Frequency Ordered Stop   04/10/21 1015  remdesivir 100 mg in sodium chloride 0.9 % 100 mL IVPB  Status:  Discontinued        100 mg 200 mL/hr over 30 Minutes Intravenous Daily 04/10/21 0923  04/10/21 1204   04/09/21 0915  fluconazole (DIFLUCAN) tablet 200 mg        200 mg Oral  Once 04/09/21 0824 04/09/21 1031   04/06/21 1000  remdesivir 100 mg in sodium chloride 0.9 % 100 mL IVPB  Status:  Discontinued       See Hyperspace for full Linked Orders Report.   100 mg 200 mL/hr over 30 Minutes Intravenous Daily 04/05/21 0847 04/10/21 0922   04/05/21 1400  piperacillin-tazobactam (ZOSYN) IVPB 2.25 g  Status:  Discontinued       Note to Pharmacy: Zosyn 2.25 g IV q8h in ESRD   2.25 g 100 mL/hr over 30 Minutes Intravenous Every 8 hours 04/05/21 0122 04/05/21 1500   04/05/21 0900  remdesivir 200 mg in sodium chloride 0.9% 250 mL IVPB  Status:  Discontinued       See Hyperspace for full Linked  Orders Report.   200 mg 580 mL/hr over 30 Minutes Intravenous Once 04/05/21 0847 04/10/21 0922   04/05/21 0200  piperacillin-tazobactam (ZOSYN) IVPB 3.375 g        3.375 g 100 mL/hr over 30 Minutes Intravenous  Once 04/05/21 0122 04/05/21 0357              Family Communication/Anticipated D/C date and plan/Code Status   DVT prophylaxis: heparin injection 5,000 Units Start: 04/04/21 2330 SCDs Start: 04/04/21 2317     Code Status: Full Code  Family Communication: None Disposition Plan:    Status is: Inpatient  Remains inpatient appropriate because:Inpatient level of care appropriate due to severity of illness  Dispo:  Patient From: Home  Planned Disposition: Home  Medically stable for discharge: No             Subjective:   C/o fatigue. Ate her breakfast  Objective:    Vitals:   04/10/21 2106 04/10/21 2331 04/11/21 0453 04/11/21 0756  BP: (!) 175/57 (!) 175/52 (!) 175/55 (!) 186/63  Pulse: 89 86 90 (!) 56  Resp: '15 16 14 13  '$ Temp: 99.3 F (37.4 C) 98.7 F (37.1 C) 97.9 F (36.6 C) 98 F (36.7 C)  TempSrc: Oral Oral Oral Oral  SpO2: 95% 100% 99% 97%  Weight:   74 kg   Height:       No data found.   Intake/Output Summary (Last 24 hours) at 04/11/2021 0924 Last data filed at 04/10/2021 1514 Gross per 24 hour  Intake 200 ml  Output --  Net 200 ml   Filed Weights   04/09/21 0419 04/10/21 0121 04/11/21 0453  Weight: 76.2 kg 73.6 kg 74 kg    Exam:  GEN: NAD SKIN: Bruises on abdomen EYES: No pallor or icterus ENT: MMM CV: RRR PULM: CTA B ABD: soft, ND, NT, +BS CNS: AAO x 3, non focal EXT: No edema or tenderness. Left arm AV fistula with bruit        Data Reviewed:   I have personally reviewed following labs and imaging studies:  Labs: Labs show the following:   Basic Metabolic Panel: Recent Labs  Lab 04/07/21 1340 04/08/21 0313 04/09/21 0341 04/10/21 0424 04/11/21 0248  NA 136 135 138 138 136  K 3.7 3.1* 2.9* 3.3*  4.1  CL 97* 95* 100 101 95*  CO2 '23 27 25 26 27  '$ GLUCOSE 155* 182* 155* 114* 199*  BUN 63* 25* 68* 33* 62*  CREATININE 5.14* 2.56* 5.04* 3.28* 5.02*  CALCIUM 8.3* 9.0 8.7* 8.5* 9.2  MG  --   --  2.6*  --   --   PHOS 8.1*  --   --   --   --    GFR Estimated Creatinine Clearance: 10.9 mL/min (A) (by C-G formula based on SCr of 5.02 mg/dL (H)). Liver Function Tests: Recent Labs  Lab 04/07/21 0221 04/07/21 1340 04/08/21 0313 04/09/21 0341 04/10/21 0424 04/11/21 0248  AST 16  --  19 14* 13* 13*  ALT 13  --  '18 13 14 12  '$ ALKPHOS 67  --  81 72 59 59  BILITOT 0.2*  --  0.5 0.3 0.3 0.6  PROT 6.1*  --  7.7 5.9* 6.0* 6.4*  ALBUMIN 2.9* 2.9* 3.6 2.8* 2.8* 3.0*   No results for input(s): LIPASE, AMYLASE in the last 168 hours. No results for input(s): AMMONIA in the last 168 hours. Coagulation profile No results for input(s): INR, PROTIME in the last 168 hours.  CBC: Recent Labs  Lab 04/07/21 0221 04/07/21 1340 04/08/21 0313 04/09/21 0341 04/10/21 0424 04/11/21 0248  WBC 10.4 7.3 8.3 9.7 7.4 5.4  NEUTROABS 9.4*  --  7.5 7.5 5.7 4.4  HGB 9.8* 10.0* 11.6* 10.7* 10.8* 10.9*  HCT 34.0* 33.7* 38.5 35.9* 35.6* 36.5  MCV 92.6 92.8 88.5 89.8 89.0 91.3  PLT 219 210 236 208 194 199   Cardiac Enzymes: No results for input(s): CKTOTAL, CKMB, CKMBINDEX, TROPONINI in the last 168 hours. BNP (last 3 results) No results for input(s): PROBNP in the last 8760 hours. CBG: Recent Labs  Lab 04/10/21 0637 04/10/21 1119 04/10/21 1541 04/10/21 2102 04/11/21 0511  GLUCAP 109* 162* 190* 361* 170*   D-Dimer: Recent Labs    04/10/21 0424 04/11/21 0248  DDIMER 2.19* 1.06*   Hgb A1c: No results for input(s): HGBA1C in the last 72 hours. Lipid Profile: No results for input(s): CHOL, HDL, LDLCALC, TRIG, CHOLHDL, LDLDIRECT in the last 72 hours. Thyroid function studies: No results for input(s): TSH, T4TOTAL, T3FREE, THYROIDAB in the last 72 hours.  Invalid input(s): FREET3 Anemia work  up: No results for input(s): VITAMINB12, FOLATE, FERRITIN, TIBC, IRON, RETICCTPCT in the last 72 hours. Sepsis Labs: Recent Labs  Lab 04/05/21 0339 04/06/21 0308 04/08/21 0313 04/09/21 0341 04/10/21 0424 04/11/21 0248  PROCALCITON 0.30  --   --   --   --   --   WBC 4.0   < > 8.3 9.7 7.4 5.4   < > = values in this interval not displayed.    Microbiology Recent Results (from the past 240 hour(s))  Resp Panel by RT-PCR (Flu A&B, Covid) Nasopharyngeal Swab     Status: Abnormal   Collection Time: 04/04/21  8:55 PM   Specimen: Nasopharyngeal Swab; Nasopharyngeal(NP) swabs in vial transport medium  Result Value Ref Range Status   SARS Coronavirus 2 by RT PCR POSITIVE (A) NEGATIVE Final    Comment: RESULT CALLED TO, READ BACK BY AND VERIFIED WITH: SEEARNARS RN, AT 2334 04/04/21 D. VANHOOK (NOTE) SARS-CoV-2 target nucleic acids are DETECTED.  The SARS-CoV-2 RNA is generally detectable in upper respiratory specimens during the acute phase of infection. Positive results are indicative of the presence of the identified virus, but do not rule out bacterial infection or co-infection with other pathogens not detected by the test. Clinical correlation with patient history and other diagnostic information is necessary to determine patient infection status. The expected result is Negative.  Fact Sheet for Patients: EntrepreneurPulse.com.au  Fact Sheet for Healthcare Providers: IncredibleEmployment.be  This test is not yet approved or cleared by the  [place] Architectural technologist and  has been authorized for detection and/or diagnosis of SARS-CoV-2 by FDA under an Print production planner (EUA).  This EUA will remain in effect (meaning this test  can be used) for the duration of  the COVID-19 declaration under Section 564(b)(1) of the Act, 21 U.S.C. section 360bbb-3(b)(1), unless the authorization is terminated or revoked sooner.     Influenza A by PCR NEGATIVE  NEGATIVE Final   Influenza B by PCR NEGATIVE NEGATIVE Final    Comment: (NOTE) The Xpert Xpress SARS-CoV-2/FLU/RSV plus assay is intended as an aid in the diagnosis of influenza from Nasopharyngeal swab specimens and should not be used as a sole basis for treatment. Nasal washings and aspirates are unacceptable for Xpert Xpress SARS-CoV-2/FLU/RSV testing.  Fact Sheet for Patients: EntrepreneurPulse.com.au  Fact Sheet for Healthcare Providers: IncredibleEmployment.be  This test is not yet approved or cleared by the [place] FDA and has been authorized for detection and/or diagnosis of SARS-CoV-2 by FDA under an Emergency Use Authorization (EUA). This EUA will remain in effect (meaning this test can be used) for the duration of the COVID-19 declaration under Section 564(b)(1) of the Act, 21 U.S.C. section 360bbb-3(b)(1), unless the authorization is terminated or revoked.  Performed at [facility], [place] [address], [place], [place] [address]     Procedures and diagnostic studies:  No results found.             LOS: 7 days   Elaysia Devargas  [facility]   Pager on [email or web address]. If 7PM-7AM, please contact night-coverage at [email or web address]     04/11/2021, 9:24 AM

## 2021-04-11 NOTE — Progress Notes (Signed)
PT Cancellation Note  Patient Details Name: Tina Gomez MRN: [id number] DOB: 06-10-1956   Cancelled Treatment:    Reason Eval/Treat Not Completed: Patient at procedure or test/unavailable. PT attempted to see pt twice for treatment, upon initial attempt pt eating and RN reports transport arriving soon for pickup to dialysis. Upon 2nd attempt pt remains off floor. PT will follow up per plan of care.   Zenaida Niece 04/11/2021, 4:36 PM

## 2021-04-11 NOTE — Progress Notes (Signed)
Inpatient Diabetes Program Recommendations  AACE/ADA: New Consensus Statement on Inpatient Glycemic Control   Target Ranges:  Prepandial:   less than 140 mg/dL      Peak postprandial:   less than 180 mg/dL (1-2 hours)      Critically ill patients:  140 - 180 mg/dL   Results for ABERDEEN, MESCALL (MRN [id number]) as of 04/11/2021 10:59  Ref. Range 04/10/2021 06:37 04/10/2021 11:19 04/10/2021 15:41 04/10/2021 21:02 04/11/2021 05:11  Glucose-Capillary Latest Ref Range: 70 - 99 mg/dL 109 (H) 162 (H) 190 (H) 361 (H) 170 (H)   Review of Glycemic Control  Diabetes history: DM2 Outpatient Diabetes medications: None; diet controlled Current orders for Inpatient glycemic control: Novolog 0-20 units TID with meals, Novolog 0-5 units QHS; Solumedrol 80 mg Q12H  Inpatient Diabetes Program Recommendations:    Insulin: If steroids are continued as ordered, please consider ordering Novolog 3 units TID with meals for meal coverage if patient eats at least 50% of meals.   Thanks, Barnie Alderman, RN, MSN, CDE Diabetes Coordinator Inpatient Diabetes Program [phone] (Team Pager from 8am to 5pm)

## 2021-04-11 NOTE — Consult Note (Signed)
Palliative Medicine Inpatient Consult Note  Consulting Provider: Aline August, MD  Reason for consult:   [place] Palliative Medicine Consult  Reason for Consult? Goals of care    HPI:  Per intake H&P --> Tina Gomez is a 65 y.o. female with medical history significant for ESRD on HD MWF, COPD, chronic hypoxia on 4 L nasal cannula at baseline, hypertension, hyperlipidemia, anemia of chronic kidney disease, hyperlipidemia, chronic anxiety/depression, who presented to [facility] ED from home due to fever 102, cough, shortness of breath.  Onset on 04/03/2021, she took a COVID-19 screening test at home which returned negative.  She went to hemodialysis the following day with persistent symptoms, completed hemodialysis, returned home and took a COVID-19 test which returned positive.  EMS was called.  Upon EMS arrival oxygen saturation in the 80s on 4 L [place].  Oxygen supplementation increased to 6 L with O2 saturation in the low 90s.  She was brought into the ED for further evaluation.  Wheezing on exam, endorses feeling very weak and tired.  Chest x-ray revealing bilateral pulmonary opacities.  EDP contacted nephrology who recommended treatment of underlying conditions, multifocal pneumonia.  COVID-19 screening test positive on 04/04/2021.  Patient admitted to hospitalist team for acute on chronic hypoxic respiratory failure secondary to COVID-19 viral pneumonia likely superimposed by bacterial pneumonia.  Palliative care was asked to get involved in goals of care conversations.   Clinical Assessment/Goals of Care:  *Please note that this is a verbal dictation therefore any spelling or grammatical errors are due to the "[place]" system interpretation.  I have reviewed medical records including EPIC notes, labs and imaging, received report from bedside RN, assessed the patient who was receiving hemodialysis in no acute distress.   I met with Tina Gomez to further  discuss diagnosis prognosis, GOC, EOL wishes, disposition and options.  Tina Gomez and I reviewed her past medical conditions inclusive of her end-stage renal disease for which she receives hemodialysis every Monday Wednesday and Friday.  We reviewed her history of COPD which requires her to be on 4 L of oxygen at home.  We reviewed her history of anxiety and depression.   I introduced Palliative Medicine as specialized medical care for people living with serious illness. It focuses on providing relief from the symptoms and stress of a serious illness. The goal is to improve quality of life for both the patient and the family.  Tina Gomez shares that she has familiarity with palliative care and hospice as she had hospice for both her daughter who is 54 years old when she died of a neuroblastoma and her mother whom she also worked with hospice care on to the end of her life.  [place]  She is married but is separated from her husband.  She had 4 children her daughter whom has passed away and her other 3 who are living.  She shares with me that one of her sons has severe autism and she has been his primary caregiver throughout his life for 35 years.  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  Goes on to share that she has 6 grandchildren and 1 great grandchild.  She expresses to me that her family is the most importance component of her life and she gets tremendous joy out of seeing her grandchildren grow up.  He is a woman of faith and considers herself to have a  personal relationship with God and prayer.  Prior to hospital admission Tina Gomez had been living with her daughter, Tina Gomez.  Tina Gomez had set up a mother-in-law suite in her home with rails for safety.  Tina Gomez does utilize a walker in the home and has a wheelchair for long distances.  She is able to at this point bathe and dress herself though if any additional care needs are  required she shares that her daughter is very assertive to these.  A detailed discussion was had today regarding advanced directives - Tina Gomez has never completed these though is interested in doing so while hospitalized.    Concepts specific to code status, artifical feeding and hydration, continued IV antibiotics and rehospitalization was had.  Tina Gomez shares with me that she realizes that her health is not the greatest.  She shares that at this point in time she would want to maintain being full code full scope of care though she does realize if her condition changes she will have further consideration to a DO NOT RESUSCITATE order.  Tina Gomez is very familiar with what a DO NOT RESUSCITATE means as we reviewed the trauma of her having to pursue 1 of those for her child who passed away.  Provided a MOST form for review and completion as well as the "Hard Choices for [employer]" booklet.  Tina Gomez is clear about never wanting to have a life in a vegetative state where she could not enjoy the company around her.  The difference between a aggressive medical intervention path  and a palliative comfort care path for this patient at this time was had.  As noted above Tina Gomez is very welcoming towards hospice care though at this point in time she does not feel she is at that point.  If and when that time does, she would be very open to further conversations.  This point time Tina Gomez hopes to continue to improve her present health state.  She reviews that she has great quality in her life is spending time with her grandchildren provides her notable joy.  Discussed the importance of continued conversation with family and their  medical providers regarding overall plan of care and treatment options, ensuring decisions are within the context of the patients values and GOCs.  Decision Maker: Tina Gomez make decisions for herself though she does share if she were ever for any reason incapacitated she would wish for her  daughter [place] to make decisions in her honor.  SUMMARY OF RECOMMENDATIONS   Full code/full scope of care - Provided a MOST form for review and completion  Appreciate spiritual service helping to complete advance directives  Patient has a very realistic view of her present comorbidities and overall illness trajectory  Goals are for continued improvement  Ongoing incremental palliative care support  Code Status/Advance Care Planning: FULL CODE  Palliative Prophylaxis:  Oral Care, Mobility  Additional Recommendations (Limitations, Scope, Preferences): Continue current scope of care   Psycho-social/Spiritual:  Desire for further Chaplaincy support: No Additional Recommendations: Education on chronic disease processes   Prognosis: For hospital admissions in 1 year of visit in the last 6 months.  Discharge Planning: Unclear presently  Vitals:   04/11/21 1600 04/11/21 1630  BP: (!) 193/79 (!) 172/78  Pulse: 60 60  Resp: 15 16  Temp:    SpO2:      Intake/Output Summary (Last 24 hours) at 04/11/2021 1656 Last data filed at 04/11/2021 1321 Gross per 24 hour  Intake 240 ml  Output --  Net 240 ml   Last Weight  Most recent update: 04/11/2021  4:40 PM    Weight  75.3 kg (166 lb 0.1 oz)            Gen:  Middle age caucasian F in NAD HEENT: moist mucous membranes CV: Regular rate and rhythm  PULM: On 10LPM HFNC ABD: soft/nontender  GVS:[id number] edema, ecchymotic upper extremities Neuro: Alert and oriented x3  PPS: 30-40%   This conversation/these recommendations were discussed with patient primary care team, Dr. Mal Misty  Time In: 1700 Time Out: 1810 Total Time: 70 Greater than 50%  of this time was spent counseling and coordinating care related to the above assessment and plan.  [facility] Team Team Cell Phone: [phone] Please utilize secure chat with additional questions, if there is no response within 30  minutes please call the above phone number  Palliative Medicine Team providers are available by phone from 7am to 7pm daily and can be reached through the team cell phone.  Should this patient require assistance outside of these hours, please call the patient's attending physician.

## 2021-04-11 NOTE — Progress Notes (Signed)
[facility] Progress Note  Subjective: said that she slept-  appetite is better-  overall beter-  sats improved-  due for HD today   Vitals:   04/10/21 2106 04/10/21 2331 04/11/21 0453 04/11/21 0756  BP: (!) 175/57 (!) 175/52 (!) 175/55 (!) 186/63  Pulse: 89 86 90 (!) 56  Resp: '15 16 14 13  '$ Temp: 99.3 F (37.4 C) 98.7 F (37.1 C) 97.9 F (36.6 C) 98 F (36.7 C)  TempSrc: Oral Oral Oral Oral  SpO2: 95% 100% 99% 97%  Weight:   74 kg   Height:        Exam: General: chronically ill appearing female in NAD Head: NCAT sclera not icteric MMM Neck: Supple. No lymphadenopathy Lungs: mostly clear, occ crackles Heart: RRR. No murmur, rubs or gallops. Abdomen: soft, nontender, +BS, no guarding, no rebound tenderness M/S:  Equal strength b/l in upper and lower extremities. Lower extremities:no edema, ischemic changes, or open wounds Neuro: AAOx3. Moves all extremities spontaneously. Psych:  Responds to questions appropriately with a normal affect. Dialysis Access: LU AVF +b/t   Dialysis:  MWF [facility]  3h  400/500  77kg  3K/2.5Ca bath  LUA AVF  Hep 5000  Mircera 225 mcg q2wks - last 7/18 Venofer '100mg'$  qHD x10 (2 completed) Hectorol 1mg IV qHD   Assessment/Plan:  COVID PNA- tested positive on 7/27. Per PMD-   actemra and steroids  Acute on chronic respiratory failure - 2/2 #1 & likely superimposed by bacterial PNA and vol excess/ pulm edema. On 4L O2 at home.   O2 seems stalled on high flow [place].  UF as able today   ESRD -  on HD MWFSat for 3h. Good HD Sat night, 3L UF.  HD again today- cont on schedule, cont to lower vol as tolerates. Plan again for today (Wed)  Hypertension/volume  - Blood pressure elevated.  Usually takes midodrine '10mg'$  pre HD > started '10mg'$  tid here. Continue home meds-  coreg 6.25, losartan 25-  doses decreased, others as needed.... not sure that she needs midodrine - will stop it.  Will need lower EDW at discharge  Anemia of CKD -  Will hold  iron in setting of acute infection.  ESA due 8/1-  hgb over 10-  will hold for now  Secondary Hyperparathyroidism -  Ca in goal. Phos high, not unusual. Continue VDRA (hect) and phoslo 2 AC TID  Nutrition - Renal diet w/fluid restrictions     KLouis Meckel 04/11/2021, 8:32 AM   Recent Labs  Lab 04/07/21 1340 04/08/21 0313 04/10/21 0424 04/11/21 0248  K 3.7   < > 3.3* 4.1  BUN 63*   < > 33* 62*  CREATININE 5.14*   < > 3.28* 5.02*  CALCIUM 8.3*   < > 8.5* 9.2  PHOS 8.1*  --   --   --   HGB 10.0*   < > 10.8* 10.9*   < > = values in this interval not displayed.   Inpatient medications:  vitamin C  500 mg Oral Daily   aspirin  325 mg Oral Daily   atorvastatin  40 mg Oral q1800   calcium acetate  1,334 mg Oral TID WC   carvedilol  6.25 mg Oral BID WC   Chlorhexidine Gluconate Cloth  6 each Topical Q0600   Chlorhexidine Gluconate Cloth  6 each Topical Q0600   doxercalciferol  1 mcg Intravenous Q M,W,F-HD   FLUoxetine  20 mg Oral Daily  gabapentin  100 mg Oral QHS   heparin  5,000 Units Subcutaneous Q8H   insulin aspart  0-20 Units Subcutaneous TID WC   insulin aspart  0-5 Units Subcutaneous QHS   losartan  25 mg Oral QPM   methylPREDNISolone (SOLU-MEDROL) injection  80 mg Intravenous Q12H   multivitamin with minerals  1 tablet Oral Daily   zinc sulfate  220 mg Oral Daily    sodium chloride     sodium chloride     sodium chloride, sodium chloride, acetaminophen, albuterol, alteplase, guaiFENesin-dextromethorphan, heparin, hydrALAZINE, labetalol, lidocaine (PF), lidocaine-prilocaine, melatonin, ondansetron (ZOFRAN) IV, pentafluoroprop-tetrafluoroeth, polyethylene glycol

## 2021-04-12 DIAGNOSIS — U071 COVID-19: Secondary | ICD-10-CM | POA: Diagnosis not present

## 2021-04-12 DIAGNOSIS — I10 Essential (primary) hypertension: Secondary | ICD-10-CM | POA: Diagnosis not present

## 2021-04-12 DIAGNOSIS — J9621 Acute and chronic respiratory failure with hypoxia: Secondary | ICD-10-CM | POA: Diagnosis not present

## 2021-04-12 DIAGNOSIS — N186 End stage renal disease: Secondary | ICD-10-CM | POA: Diagnosis not present

## 2021-04-12 LAB — GLUCOSE, CAPILLARY
Glucose-Capillary: 230 mg/dL — ABNORMAL HIGH (ref 70–99)
Glucose-Capillary: 284 mg/dL — ABNORMAL HIGH (ref 70–99)
Glucose-Capillary: 202 mg/dL — ABNORMAL HIGH (ref 70–99)
Glucose-Capillary: 407 mg/dL — ABNORMAL HIGH (ref 70–99)

## 2021-04-12 MED ORDER — METHYLPREDNISOLONE SODIUM SUCC 40 MG IJ SOLR
40.0000 mg | Freq: Two times a day (BID) | INTRAMUSCULAR | Status: DC
  Administered 2021-04-13: 40 mg via INTRAVENOUS
  Filled 2021-04-12: qty 1

## 2021-04-12 MED ORDER — CHLORHEXIDINE GLUCONATE CLOTH 2 % EX PADS: 6.0000 | MEDICATED_PAD | Freq: Every day | CUTANEOUS | Status: DC

## 2021-04-12 NOTE — Progress Notes (Signed)
Physical Therapy Treatment Patient Details Name: Tina Gomez MRN: [id number] DOB: May 31, 1956 Today's Date: 04/12/2021    History of Present Illness Pt is a 65 y.o. female admitted 04/04/2021 with fever, cough, SOB. Workup for (+) COVID-19, HTN urgency. CXR with bilateral pulmonary opacities. PMH includes ESRD (HD MWF), COPD (4L O2 baseline), HTN, HLD, anemia, chronic anxiety/depression.   PT Comments    Pt progressing well with mobility. Tolerated multiple bouts of activity this session, initially on 8L O2 [place], working down to 4L O2 [place]. Pt motivated to participate and regain PLOF; continues to require multiple seated rest breaks secondary to SOB and fatigue, although activity tolerance improving significantly. Will continue to follow acutely.  SpO2 92-96% on 6L [place] with activity SpO2 >/91% on 4L O2 [place] with activity    Follow Up Recommendations  Home health PT;Supervision for mobility/OOB     Equipment Recommendations  None recommended by PT    Recommendations for Other Services       Precautions / Restrictions Precautions Precautions: Fall;Other (comment) Precaution Comments: Watch SpO2 (wears 4L O2 baseline) Restrictions Weight Bearing Restrictions: No    Mobility  Bed Mobility Overal bed mobility: Modified Independent                  Transfers Overall transfer level: Modified independent   Transfers: Sit to/from Stand           General transfer comment: Reliant on UE support of rail/furniture to stabilize; declines use of RW  Ambulation/Gait Ambulation/Gait assistance: Min guard Gait Distance (Feet): 160 Feet Assistive device: None;1 person hand held assist Gait Pattern/deviations: Step-through pattern;Decreased stride length Gait velocity: Decreased Gait velocity interpretation: <1.8 ft/sec, indicate of risk for recurrent falls General Gait Details: Slow, mildly unsteady gait without DME, stability much improved with HHA or UE support on wall/furniture  (pt declines use of RW this session); 2x seated rest breaks secondary to fatigue and SOB; additional gait trial on 8L, titrated down to [employer] Mobility    Modified Rankin (Stroke Patients Only)       Balance Overall balance assessment: Needs assistance Sitting-balance support: No upper extremity supported;Feet supported Sitting balance-Leahy Scale: Good       Standing balance-Leahy Scale: Fair Standing balance comment: Can static stand without UE support; static and dynamic stability improved with UE support                            Cognition Arousal/Alertness: Awake/alert Behavior During Therapy: WFL for tasks assessed/performed Overall Cognitive Status: Within Functional Limits for tasks assessed                                        Exercises Other Exercises Other Exercises: 10x repeated sit<>stand (reliant on UE support) on 4L O2 [place] Other Exercises: IS x10 with max up to 1212m (min verbal cues for technique)    General Comments General comments (skin integrity, edema, etc.): SpO2 maintaining >/91% on 5L O2 [place]; HR 50s-60s, post-ambulation BP 166/63; left resting on 6L O2 at end of session with SpO2 95%      Pertinent Vitals/Pain Pain Assessment: No/denies pain    Home Living  Prior Function            PT Goals (current goals can now be found in the care plan section) Progress towards PT goals: Progressing toward goals    Frequency    Min 3X/week      PT Plan Current plan remains appropriate    Co-evaluation              AM-PAC PT "6 Clicks" Mobility   Outcome Measure  Help needed turning from your back to your side while in a flat bed without using bedrails?: None Help needed moving from lying on your back to sitting on the side of a flat bed without using bedrails?: None Help needed moving to and from a bed to a chair (including a wheelchair)?: A  Little Help needed standing up from a chair using your arms (e.g., wheelchair or bedside chair)?: A Little Help needed to walk in hospital room?: A Little Help needed climbing 3-5 steps with a railing? : A Little 6 Click Score: 20    End of Session Equipment Utilized During Treatment: Oxygen Activity Tolerance: Patient tolerated treatment well Patient left: in bed;with call bell/phone within reach Nurse Communication: Mobility status PT Visit Diagnosis: Other abnormalities of gait and mobility (R26.89);Muscle weakness (generalized) (M62.81)     Time: [id number] PT Time Calculation (min) (ACUTE ONLY): 28 min  Charges:  $Gait Training: 8-22 mins $Therapeutic Exercise: 8-22 mins                     Mabeline Caras, PT, DPT Acute Rehabilitation Services  Pager [phone] Office [place] 04/12/2021, 4:03 PM

## 2021-04-12 NOTE — Progress Notes (Signed)
Inpatient Diabetes Program Recommendations  AACE/ADA: New Consensus Statement on Inpatient Glycemic Control   Target Ranges:  Prepandial:   less than 140 mg/dL      Peak postprandial:   less than 180 mg/dL (1-2 hours)      Critically ill patients:  140 - 180 mg/dL   Results for CORDIE, SPEAKMAN (MRN [id number]) as of 04/12/2021 10:52  Ref. Range 04/11/2021 05:11 04/11/2021 11:30 04/11/2021 20:27 04/12/2021 05:23  Glucose-Capillary Latest Ref Range: 70 - 99 mg/dL 170 (H) 357 (H) 253 (H) 230 (H)   Review of Glycemic Control  Diabetes history: DM2 Outpatient Diabetes medications: None; diet controlled Current orders for Inpatient glycemic control: Novolog 0-20 units TID with meals, Novolog 0-5 units QHS; Solumedrol 80 mg Q12H   Inpatient Diabetes Program Recommendations:     Insulin: If steroids are continued as ordered, please consider ordering Novolog 4 units TID with meals for meal coverage if patient eats at least 50% of meals.   Thanks, Barnie Alderman, RN, MSN, CDE Diabetes Coordinator Inpatient Diabetes Program [phone] (Team Pager from 8am to 5pm)

## 2021-04-12 NOTE — Progress Notes (Signed)
This chaplain responded to PMT consult for creating or updating the Pt. Advance Directive.    The Pt. is awake and participating in AD education with the chaplain. The chaplain understands the Pt. is choosing her daughter Devanne Benoy as her healthcare agent.  The chaplain left the document with the Pt.  Hospital notary and witnesses will not be available until the Pt. out of isolation.  The chaplain understands the Pt. has notary resources when discharged.   The chaplain listened reflectively as the Pt. shared the stories supporting her decisions for Advance Care Planning.   The Pt. accepted the chaplain's invitation for F/U spiritual care.

## 2021-04-12 NOTE — Care Management Important Message (Signed)
Important Message  Patient Details  Name: Tina Gomez MRN: [id number] Date of Birth: Mar 22, 1956   Medicare Important Message Given:  Yes     [name] 04/12/2021, 10:10 AM

## 2021-04-12 NOTE — Progress Notes (Addendum)
Progress Note    Tina Gomez  [id number] DOB: [birth year]  DOA: 04/04/2021 PCP: Charlott Rakes, MD      Brief Narrative:    Medical records reviewed and are as summarized below:  Tina Gomez is a 65 y.o. female with medical history significant for type 2 diabetes mellitus ESRD on HD MWF, COPD, chronic hypoxia on 4 L nasal cannula at baseline, hypertension, hyperlipidemia, anemia of chronic kidney disease, hyperlipidemia, chronic anxiety/depression presented on 04/04/2021 with fever, cough and shortness of breath.  On presentation, her saturations were in the 80s on 4 L nasal cannula.  Chest x-ray showed bilateral pulmonary opacities, multifocal pneumonia.  COVID testing was positive.  She was admitted to the hospital for acute on chronic hypoxic respiratory failure secondary to COVID-19 pneumonia.  She was treated with steroids and required high flow oxygen.  She refused remdesivir.  She was also treated with Actemra.  Nephrologist was consulted for hemodialysis.  [facility]      Assessment/Plan:   Active Problems:   Type 2 diabetes mellitus with diabetic neuropathy, unspecified (HCC)   Essential hypertension, benign   Acute on chronic respiratory failure with hypoxia (HCC)   Acute respiratory disease due to COVID-19 virus   ESRD on hemodialysis (HCC)   Generalized weakness    Body mass index is 28.24 kg/m.   Acute on chronic hypoxic respiratory failure: Improving.  Oxygen requirement is down to 8 L/min oxygen via high flow nasal cannula.  She uses 4 L/min oxygen at baseline.  Continue to taper down oxygen as able.    COVID-19 bilateral pneumonia: S/p treatment with Actemra on 04/10/2021.  Continue IV Solu-Medrol.  She refused remdesivir.  Hypertension, hypertensive urgency: Continue antihypertensives  COPD: Continue bronchodilators  ESRD: Follow-up with nephrologist for hemodialysis.  Type II DM with hyperglycemia:  Decrease dose of IV Solu-Medrol.  Continue NovoLog as needed for hyperglycemia..   Diet Order             Diet renal with fluid restriction Fluid restriction: 1200 mL Fluid; Room service appropriate? Yes; Fluid consistency: Thin  Diet effective now                      Consultants: Intensivist Nephrologist  Procedures: None    Medications:    vitamin C  500 mg Oral Daily   aspirin  325 mg Oral Daily   atorvastatin  40 mg Oral q1800   calcium acetate  1,334 mg Oral TID WC   carvedilol  6.25 mg Oral BID WC   Chlorhexidine Gluconate Cloth  6 each Topical Q0600   doxercalciferol  1 mcg Intravenous Q M,W,F-HD   FLUoxetine  20 mg Oral Daily   gabapentin  100 mg Oral QHS   heparin  5,000 Units Subcutaneous Q8H   insulin aspart  0-20 Units Subcutaneous TID WC   insulin aspart  0-5 Units Subcutaneous QHS   losartan  25 mg Oral QPM   [START ON 04/13/2021] methylPREDNISolone (SOLU-MEDROL) injection  40 mg Intravenous Q12H   multivitamin with minerals  1 tablet Oral Daily   zinc sulfate  220 mg Oral Daily   Continuous Infusions:  sodium chloride     sodium chloride       Anti-infectives (From admission, onward)    Start     Dose/Rate Route Frequency Ordered Stop   04/10/21 1015  remdesivir 100 mg in sodium chloride 0.9 % 100 mL IVPB  Status:  Discontinued        100 mg 200 mL/hr over 30 Minutes Intravenous Daily 04/10/21 0923 04/10/21 1204   04/09/21 0915  fluconazole (DIFLUCAN) tablet 200 mg        200 mg Oral  Once 04/09/21 0824 04/09/21 1031   04/06/21 1000  remdesivir 100 mg in sodium chloride 0.9 % 100 mL IVPB  Status:  Discontinued       See Hyperspace for full Linked Orders Report.   100 mg 200 mL/hr over 30 Minutes Intravenous Daily 04/05/21 0847 04/10/21 0922   04/05/21 1400  piperacillin-tazobactam (ZOSYN) IVPB 2.25 g  Status:  Discontinued       Note to Pharmacy: Zosyn 2.25 g IV q8h in ESRD   2.25 g 100 mL/hr over 30 Minutes Intravenous Every 8 hours  04/05/21 0122 04/05/21 1500   04/05/21 0900  remdesivir 200 mg in sodium chloride 0.9% 250 mL IVPB  Status:  Discontinued       See Hyperspace for full Linked Orders Report.   200 mg 580 mL/hr over 30 Minutes Intravenous Once 04/05/21 0847 04/10/21 0922   04/05/21 0200  piperacillin-tazobactam (ZOSYN) IVPB 3.375 g        3.375 g 100 mL/hr over 30 Minutes Intravenous  Once 04/05/21 0122 04/05/21 0357              Family Communication/Anticipated D/C date and plan/Code Status   DVT prophylaxis: heparin injection 5,000 Units Start: 04/04/21 2330 SCDs Start: 04/04/21 2317     Code Status: Full Code  Family Communication: None Disposition Plan:    Status is: Inpatient  Remains inpatient appropriate because:Inpatient level of care appropriate due to severity of illness  Dispo:  Patient From: Home  Planned Disposition: Home  Medically stable for discharge: No             Subjective:   Interval events noted.  She complains of fatigue.  Objective:    Vitals:   04/12/21 0900 04/12/21 1200 04/12/21 1224 04/12/21 1300  BP: (!) 172/59 (!) 201/72 (!) 201/72 (!) 192/56  Pulse: (!) 54 (!) 56 (!) 56 (!) 51  Resp: '14 19 17 13  '$ Temp:  98.1 F (36.7 C) 98.1 F (36.7 C)   TempSrc:  Oral    SpO2: 100% 100%  98%  Weight:      Height:       No data found.   Intake/Output Summary (Last 24 hours) at 04/12/2021 1352 Last data filed at 04/12/2021 0934 Gross per 24 hour  Intake 240 ml  Output 3000 ml  Net -2760 ml   Filed Weights   04/11/21 1452 04/11/21 1500 04/12/21 0143  Weight: 75.3 kg 75.3 kg 72.3 kg    Exam:  GEN: NAD SKIN: Warm and dry EYES: No pallor or icterus ENT: MMM CV: RRR PULM: Bibasilar rales.  No wheezing heard. ABD: soft, ND, NT, +BS CNS: AAO x 3, non focal EXT: No edema or tenderness     Data Reviewed:   I have personally reviewed following labs and imaging studies:  Labs: Labs show the following:   Basic Metabolic  Panel: Recent Labs  Lab 04/07/21 1340 04/08/21 0313 04/09/21 0341 04/10/21 0424 04/11/21 0248  NA 136 135 138 138 136  K 3.7 3.1* 2.9* 3.3* 4.1  CL 97* 95* 100 101 95*  CO2 '23 27 25 26 27  '$ GLUCOSE 155* 182* 155* 114* 199*  BUN 63* 25* 68* 33* 62*  CREATININE 5.14* 2.56* 5.04* 3.28*  5.02*  CALCIUM 8.3* 9.0 8.7* 8.5* 9.2  MG  --   --  2.6*  --   --   PHOS 8.1*  --   --   --   --    GFR Estimated Creatinine Clearance: 10.8 mL/min (A) (by C-G formula based on SCr of 5.02 mg/dL (H)). Liver Function Tests: Recent Labs  Lab 04/07/21 0221 04/07/21 1340 04/08/21 0313 04/09/21 0341 04/10/21 0424 04/11/21 0248  AST 16  --  19 14* 13* 13*  ALT 13  --  '18 13 14 12  '$ ALKPHOS 67  --  81 72 59 59  BILITOT 0.2*  --  0.5 0.3 0.3 0.6  PROT 6.1*  --  7.7 5.9* 6.0* 6.4*  ALBUMIN 2.9* 2.9* 3.6 2.8* 2.8* 3.0*   No results for input(s): LIPASE, AMYLASE in the last 168 hours. No results for input(s): AMMONIA in the last 168 hours. Coagulation profile No results for input(s): INR, PROTIME in the last 168 hours.  CBC: Recent Labs  Lab 04/07/21 0221 04/07/21 1340 04/08/21 0313 04/09/21 0341 04/10/21 0424 04/11/21 0248  WBC 10.4 7.3 8.3 9.7 7.4 5.4  NEUTROABS 9.4*  --  7.5 7.5 5.7 4.4  HGB 9.8* 10.0* 11.6* 10.7* 10.8* 10.9*  HCT 34.0* 33.7* 38.5 35.9* 35.6* 36.5  MCV 92.6 92.8 88.5 89.8 89.0 91.3  PLT 219 210 236 208 194 199   Cardiac Enzymes: No results for input(s): CKTOTAL, CKMB, CKMBINDEX, TROPONINI in the last 168 hours. BNP (last 3 results) No results for input(s): PROBNP in the last 8760 hours. CBG: Recent Labs  Lab 04/11/21 0511 04/11/21 1130 04/11/21 2027 04/12/21 0523 04/12/21 1220  GLUCAP 170* 357* 253* 230* 284*   D-Dimer: Recent Labs    04/10/21 0424 04/11/21 0248  DDIMER 2.19* 1.06*   Hgb A1c: No results for input(s): HGBA1C in the last 72 hours. Lipid Profile: No results for input(s): CHOL, HDL, LDLCALC, TRIG, CHOLHDL, LDLDIRECT in the last 72  hours. Thyroid function studies: No results for input(s): TSH, T4TOTAL, T3FREE, THYROIDAB in the last 72 hours.  Invalid input(s): FREET3 Anemia work up: No results for input(s): VITAMINB12, FOLATE, FERRITIN, TIBC, IRON, RETICCTPCT in the last 72 hours. Sepsis Labs: Recent Labs  Lab 04/08/21 0313 04/09/21 0341 04/10/21 0424 04/11/21 0248  WBC 8.3 9.7 7.4 5.4    Microbiology Recent Results (from the past 240 hour(s))  Resp Panel by RT-PCR (Flu A&B, Covid) Nasopharyngeal Swab     Status: Abnormal   Collection Time: 04/04/21  8:55 PM   Specimen: Nasopharyngeal Swab; Nasopharyngeal(NP) swabs in vial transport medium  Result Value Ref Range Status   SARS Coronavirus 2 by RT PCR POSITIVE (A) NEGATIVE Final    Comment: RESULT CALLED TO, READ BACK BY AND VERIFIED WITH: SEEARNARS RN, AT 2334 04/04/21 D. VANHOOK (NOTE) SARS-CoV-2 target nucleic acids are DETECTED.  The SARS-CoV-2 RNA is generally detectable in upper respiratory specimens during the acute phase of infection. Positive results are indicative of the presence of the identified virus, but do not rule out bacterial infection or co-infection with other pathogens not detected by the test. Clinical correlation with patient history and other diagnostic information is necessary to determine patient infection status. The expected result is Negative.  Fact Sheet for Patients: EntrepreneurPulse.com.au  Fact Sheet for Healthcare Providers: IncredibleEmployment.be  This test is not yet approved or cleared by the [place] FDA and  has been authorized for detection and/or diagnosis of SARS-CoV-2 by FDA under an Emergency Use Authorization (EUA).  This EUA will remain in effect (meaning this test  can be used) for the duration of  the COVID-19 declaration under Section 564(b)(1) of the Act, 21 U.S.C. section 360bbb-3(b)(1), unless the authorization is terminated or revoked sooner.      Influenza A by PCR NEGATIVE NEGATIVE Final   Influenza B by PCR NEGATIVE NEGATIVE Final    Comment: (NOTE) The Xpert Xpress SARS-CoV-2/FLU/RSV plus assay is intended as an aid in the diagnosis of influenza from Nasopharyngeal swab specimens and should not be used as a sole basis for treatment. Nasal washings and aspirates are unacceptable for Xpert Xpress SARS-CoV-2/FLU/RSV testing.  Fact Sheet for Patients: EntrepreneurPulse.com.au  Fact Sheet for Healthcare Providers: IncredibleEmployment.be  This test is not yet approved or cleared by the [place] FDA and has been authorized for detection and/or diagnosis of SARS-CoV-2 by FDA under an Emergency Use Authorization (EUA). This EUA will remain in effect (meaning this test can be used) for the duration of the COVID-19 declaration under Section 564(b)(1) of the Act, 21 U.S.C. section 360bbb-3(b)(1), unless the authorization is terminated or revoked.  Performed at [facility], [place] [address] [place], [place] [address]     Procedures and diagnostic studies:  No results found.             LOS: 8 days   Jordin Vicencio  [facility]   Pager on [email or web address]. If 7PM-7AM, please contact night-coverage at [email or web address]     04/12/2021, 1:52 PM

## 2021-04-12 NOTE — Progress Notes (Signed)
[facility] Progress Note  Subjective: HD yest-  removed 3000-  down to 6 liters high flow O2 at some point yesterday but back up to 10 this AM-  upset because she ran 3.5 hours last night "that is too much" are now running as OP 3 hours 4 days a week.  Also didn't sleep.  Discussion with palliative noted-  she says her QOL is great and wants full scope of care  Vitals:   04/12/21 0139 04/12/21 0140 04/12/21 0142 04/12/21 0143  BP:    (!) 181/59  Pulse: (!) 51 (!) 51 (!) 51 (!) 50  Resp: '13 12 12 13  '$ Temp:    98.1 F (36.7 C)  TempSrc:    Oral  SpO2: 100% 100% 100% 99%  Weight:    72.3 kg  Height:        Exam: General: chronically ill appearing female in NAD Head: NCAT sclera not icteric MMM Neck: Supple. No lymphadenopathy Lungs: mostly clear, occ crackles Heart: RRR. No murmur, rubs or gallops. Abdomen: soft, nontender, +BS, no guarding, no rebound tenderness M/S:  Equal strength b/l in upper and lower extremities. Lower extremities:min edema, ischemic changes, or open wounds Neuro: AAOx3. Moves all extremities spontaneously. Psych:  Responds to questions appropriately with a normal affect. Dialysis Access: LU AVF +b/t   Dialysis:  MWFS  [facility]  3h  400/500  77kg  3K/2.5Ca bath  LUA AVF  Hep 5000  Mircera 225 mcg q2wks - last 7/18 Venofer '100mg'$  qHD x10 (2 completed) Hectorol 49mg IV qHD   Assessment/Plan:  COVID PNA- tested positive on 7/27. Per PMD-   actemra and steroids  Acute on chronic respiratory failure - 2/2 #1 & likely superimposed by bacterial PNA and vol excess/ pulm edema. On 4L O2 at home.   O2 seems stalled on high flow [place].  UF as able with HD-  weight this AM was close to 5 kg under her EDW   ESRD -  on HD MWFSat for 3h.   HD again tomorrow cont on schedule, cont to lower vol as tolerates. Plan again for Friday   Hypertension/volume  - Blood pressure elevated.  Usually takes midodrine '10mg'$  pre HD > started '10mg'$  tid here. Continue home  meds-  coreg 6.25, losartan 25-  doses decreased, others as needed.... not sure that she needs midodrine - will stop it.  Will need lower EDW at discharge.  I still think there is fluid to give  Anemia of CKD -  Will hold iron in setting of acute infection.  ESA due 8/1-  hgb over 10-  will hold for now  Secondary Hyperparathyroidism -  Ca in goal. Phos high, not unusual. Continue VDRA (hect) and phoslo 2 AC TID  Nutrition - Renal diet w/fluid restrictions     KLouis Meckel 04/12/2021, 7:36 AM   Recent Labs  Lab 04/07/21 1340 04/08/21 0313 04/10/21 0424 04/11/21 0248  K 3.7   < > 3.3* 4.1  BUN 63*   < > 33* 62*  CREATININE 5.14*   < > 3.28* 5.02*  CALCIUM 8.3*   < > 8.5* 9.2  PHOS 8.1*  --   --   --   HGB 10.0*   < > 10.8* 10.9*   < > = values in this interval not displayed.   Inpatient medications:  vitamin C  500 mg Oral Daily   aspirin  325 mg Oral Daily   atorvastatin  40 mg Oral  q1800   calcium acetate  1,334 mg Oral TID WC   carvedilol  6.25 mg Oral BID WC   Chlorhexidine Gluconate Cloth  6 each Topical Q0600   Chlorhexidine Gluconate Cloth  6 each Topical Q0600   doxercalciferol  1 mcg Intravenous Q M,W,F-HD   FLUoxetine  20 mg Oral Daily   gabapentin  100 mg Oral QHS   heparin  5,000 Units Subcutaneous Q8H   insulin aspart  0-20 Units Subcutaneous TID WC   insulin aspart  0-5 Units Subcutaneous QHS   losartan  25 mg Oral QPM   methylPREDNISolone (SOLU-MEDROL) injection  80 mg Intravenous Q12H   multivitamin with minerals  1 tablet Oral Daily   zinc sulfate  220 mg Oral Daily    sodium chloride     sodium chloride     sodium chloride, sodium chloride, acetaminophen, albuterol, alteplase, guaiFENesin-dextromethorphan, heparin, hydrALAZINE, labetalol, lidocaine (PF), lidocaine-prilocaine, melatonin, ondansetron (ZOFRAN) IV, pentafluoroprop-tetrafluoroeth, polyethylene glycol

## 2021-04-13 LAB — GLUCOSE, CAPILLARY
Glucose-Capillary: 163 mg/dL — ABNORMAL HIGH (ref 70–99)
Glucose-Capillary: 388 mg/dL — ABNORMAL HIGH (ref 70–99)
Glucose-Capillary: 114 mg/dL — ABNORMAL HIGH (ref 70–99)
Glucose-Capillary: 240 mg/dL — ABNORMAL HIGH (ref 70–99)

## 2021-04-13 MED ORDER — HEPARIN SODIUM (PORCINE) 1000 UNIT/ML DIALYSIS
20.0000 [IU]/kg | INTRAMUSCULAR | Status: DC | PRN
  Filled 2021-04-13: qty 2

## 2021-04-13 NOTE — Progress Notes (Signed)
Patient ID: Tina Gomez, female   DOB: 05/18/1956, 65 y.o.   MRN: [id number] S: No events overnight. O:BP (!) 162/51 (BP Location: Right Arm)   Pulse 81   Temp 97.7 F (36.5 C) (Oral)   Resp (!) 21   Ht '5\' 3"'$  (1.6 m)   Wt 74.1 kg   LMP  (LMP Unknown)   SpO2 95%   BMI 28.95 kg/m   Intake/Output Summary (Last 24 hours) at 04/13/2021 0944 Last data filed at 04/12/2021 2001 Gross per 24 hour  Intake 240 ml  Output --  Net 240 ml   Intake/Output: I/O last 3 completed shifts: In: 480 [P.O.:480] Out: 3000 [Other:3000]  Intake/Output this shift:  No intake/output data recorded. Weight change: -1.182 kg Physical exam: unable to complete due to COVID + status.  In order to preserve PPE equipment and to minimize exposure to providers.  Notes from other caregivers reviewed   Recent Labs  Lab 04/07/21 0221 04/07/21 1340 04/08/21 0313 04/09/21 0341 04/10/21 0424 04/11/21 0248  NA 136 136 135 138 138 136  K 4.1 3.7 3.1* 2.9* 3.3* 4.1  CL 95* 97* 95* 100 101 95*  CO2 '26 23 27 25 26 27  '$ GLUCOSE 246* 155* 182* 155* 114* 199*  BUN 50* 63* 25* 68* 33* 62*  CREATININE 4.88* 5.14* 2.56* 5.04* 3.28* 5.02*  ALBUMIN 2.9* 2.9* 3.6 2.8* 2.8* 3.0*  CALCIUM 8.3* 8.3* 9.0 8.7* 8.5* 9.2  PHOS  --  8.1*  --   --   --   --   AST 16  --  19 14* 13* 13*  ALT 13  --  '18 13 14 12   '$ Liver Function Tests: Recent Labs  Lab 04/09/21 0341 04/10/21 0424 04/11/21 0248  AST 14* 13* 13*  ALT '13 14 12  '$ ALKPHOS 72 59 59  BILITOT 0.3 0.3 0.6  PROT 5.9* 6.0* 6.4*  ALBUMIN 2.8* 2.8* 3.0*   No results for input(s): LIPASE, AMYLASE in the last 168 hours. No results for input(s): AMMONIA in the last 168 hours. CBC: Recent Labs  Lab 04/07/21 1340 04/08/21 0313 04/09/21 0341 04/10/21 0424 04/11/21 0248  WBC 7.3 8.3 9.7 7.4 5.4  NEUTROABS  --  7.5 7.5 5.7 4.4  HGB 10.0* 11.6* 10.7* 10.8* 10.9*  HCT 33.7* 38.5 35.9* 35.6* 36.5  MCV 92.8 88.5 89.8 89.0 91.3  PLT 210 236 208 194 199   Cardiac  Enzymes: No results for input(s): CKTOTAL, CKMB, CKMBINDEX, TROPONINI in the last 168 hours. CBG: Recent Labs  Lab 04/12/21 0523 04/12/21 1220 04/12/21 1542 04/12/21 1953 04/13/21 0611  GLUCAP 230* 284* 202* 407* 163*    Iron Studies: No results for input(s): IRON, TIBC, TRANSFERRIN, FERRITIN in the last 72 hours. Studies/Results: No results found.  vitamin C  500 mg Oral Daily   aspirin  325 mg Oral Daily   atorvastatin  40 mg Oral q1800   calcium acetate  1,334 mg Oral TID WC   carvedilol  6.25 mg Oral BID WC   Chlorhexidine Gluconate Cloth  6 each Topical Q0600   doxercalciferol  1 mcg Intravenous Q M,W,F-HD   FLUoxetine  20 mg Oral Daily   gabapentin  100 mg Oral QHS   heparin  5,000 Units Subcutaneous Q8H   insulin aspart  0-20 Units Subcutaneous TID WC   insulin aspart  0-5 Units Subcutaneous QHS   losartan  25 mg Oral QPM   multivitamin with minerals  1 tablet Oral Daily  zinc sulfate  220 mg Oral Daily    BMET    Component Value Date/Time   NA 136 04/11/2021 0248   NA 141 11/24/2020 1304   K 4.1 04/11/2021 0248   CL 95 (L) 04/11/2021 0248   CO2 27 04/11/2021 0248   GLUCOSE 199 (H) 04/11/2021 0248   BUN 62 (H) 04/11/2021 0248   BUN 47 (H) 11/24/2020 1304   CREATININE 5.02 (H) 04/11/2021 0248   CREATININE 1.25 (H) 11/13/2016 1018   CALCIUM 9.2 04/11/2021 0248   CALCIUM 5.8 (LL) 07/24/2020 0343   GFRNONAA 9 (L) 04/11/2021 0248   GFRNONAA 47 (L) 11/13/2016 1018   GFRAA 13 (L) 06/15/2020 1134   GFRAA 54 (L) 11/13/2016 1018   CBC    Component Value Date/Time   WBC 5.4 04/11/2021 0248   RBC 4.00 04/11/2021 0248   HGB 10.9 (L) 04/11/2021 0248   HGB 9.5 (L) 02/06/2021 0927   HCT 36.5 04/11/2021 0248   HCT 31.3 (L) 02/06/2021 0927   PLT 199 04/11/2021 0248   PLT 299 02/06/2021 0927   MCV 91.3 04/11/2021 0248   MCV 93 02/06/2021 0927   MCH 27.3 04/11/2021 0248   MCHC 29.9 (L) 04/11/2021 0248   RDW 18.3 (H) 04/11/2021 0248   RDW 15.8 (H) 02/06/2021  0927   LYMPHSABS 0.5 (L) 04/11/2021 0248   LYMPHSABS 1.6 02/06/2021 0927   MONOABS 0.3 04/11/2021 0248   EOSABS 0.0 04/11/2021 0248   EOSABS 0.2 02/06/2021 0927   BASOSABS 0.0 04/11/2021 0248   BASOSABS 0.0 02/06/2021 0927    Dialysis:  MWFS  [facility]  3h  400/500  77kg  3K/2.5Ca bath  LUA AVF  Hep 5000  Mircera 225 mcg q2wks - last 7/18 Venofer '100mg'$  qHD x10 (2 completed) Hectorol 18mg IV qHD   Assessment/Plan:  COVID PNA- tested positive on 7/27. Per PMD-   actemra and steroids, pt refused remdesivir. Acute on chronic respiratory failure - 2/2 #1 & likely superimposed by bacterial PNA and vol excess/ pulm edema. On 4L O2 at home and currently able to wean down to 3 liters [place].  Able to UF below EDW and will continue to challenge with HD in hopes of improving oxygen requirements.   ESRD -  on HD MWFSat for 3h.  HD again today and UF as above.  Will evaluate schedule and oxygenation before deciding upon another HD tomorrow.    Hypertension/volume  - Blood pressure elevated.  Usually takes midodrine '10mg'$  pre HD > started '10mg'$  tid here. Continue home meds-  coreg 6.25, losartan 25-  doses decreased, others as needed.  Midodrine was stopped yesterday due to elevated BP's.  Will need lower EDW at discharge.   Anemia of CKD -  Will hold iron in setting of acute infection.  ESA due 8/1-  hgb 10.9-  will hold for now  Secondary Hyperparathyroidism -  Ca in goal. Phos high, not unusual. Continue VDRA (hect) and phoslo 2 AC TID  Nutrition - Renal diet w/fluid restrictions  JDonetta Potts MD [phone]

## 2021-04-13 NOTE — Progress Notes (Signed)
Dialysis Nurse's Note  Pt transported to [facility] (Covid Isolation room for dialysis treatments) via her bed.   Prior to starting tx, 2 attempts to cannulate venous needle site. 1st attempt, able to get flashback but unable to continue with insertion- kept losing flashback. Needle site took approx 10 min to stop bleeding. 2nd cannulation attempt was successful.  Unable to complete dialysis treatment as prescribed; there were several issues that contributed to terminating the treatment after only one hour of completion.   Throughout tx, BP was extremely labile. In the first 15 minutes, BP decreased from 185/79 to 92/54; goal turned off. SBP rose to low 100s.  Goal lowered and turned back on but BP decreased to 79/44 which required 300 ml NS and turning off goal.   Also arterial needle pressures kept going below acceptable parameters despite numerous attempts to resolve issue and lower blood pump speed. Arterial needle clotted off- unable to return arterial portion of blood. Disconnected blood line from needle; unable to aspirate arterial needle. Able to return venous portion of blood.   Pt had arrived on [facility] with oximetries 93-94%. When auscultating lungs (posteriorly)  prior to treatment; able to hear faint course crackles bilaterally. Pt ultimately required 6LNC o2; to maintain oximetries in the low 90's.  Right after returning blood to pt, notified Dr. Marval Regal re above issues and how they contributed to early termination of tx.   Pt had stable vs and no further issues prior to being transported to her room.

## 2021-04-13 NOTE — Progress Notes (Signed)
Nutrition Brief Note  Patient identified on the Malnutrition Screening Tool (MST) Report  Wt Readings from Last 15 Encounters:  04/13/21 74.1 kg  01/30/21 78.2 kg  01/03/21 76 kg  12/28/20 80 kg  12/15/20 75.9 kg  12/12/20 79.5 kg  11/30/20 77.6 kg  11/28/20 77.1 kg  11/24/20 78.2 kg  11/09/20 75.8 kg  11/02/20 77.1 kg  10/24/20 78.9 kg  10/09/20 78.9 kg  09/05/20 78.2 kg  08/22/20 78 kg   Tina Gomez is a 65 y.o. female with medical history significant for ESRD on HD MWF, COPD, chronic hypoxia on 4 L nasal cannula at baseline, hypertension, hyperlipidemia, anemia of chronic kidney disease, hyperlipidemia, chronic anxiety/depression, who presented to [facility] ED from home due to fever 102, cough, shortness of breath  Pt admitted with acute respiratory failure secondary to COVID-19.  Spoke with pt at bedside, who reports feeling better today. She shares that her appetite has greatly improved since admission and has been eating most of her meals. Noted meal completion 75-100%.   Reviewed wt hx; pt has been stable over the past 3 months. Per pt, her UBW is 170# (her dry wt is 77.3 kg per her report).   Nutrition-Focused physical exam completed. Findings are no fat depletion, no muscle depletion, and no edema.    Pt hopeful to be discharged within the next 48 hours.   Current diet order is renal with 1.2 L fluid restriction, patient is consuming approximately 75-100% of meals at this time. Labs and medications reviewed.   No nutrition interventions warranted at this time. If nutrition issues arise, please consult RD.   Loistine Chance, RD, LDN, [place] Registered Dietitian II Certified Diabetes Care and Education Specialist Please refer to [facility] for RD and/or RD on-call/weekend/after hours pager

## 2021-04-13 NOTE — Progress Notes (Signed)
Occupational Therapy Treatment Patient Details Name: Tina Gomez MRN: [id number] DOB: 1956-01-22 Today's Date: 04/13/2021    History of present illness Pt is a 65 y.o. female admitted 04/04/2021 with fever, cough, SOB. Workup for (+) COVID-19, HTN urgency. CXR with bilateral pulmonary opacities. PMH includes ESRD (HD MWF), COPD (4L O2 baseline), HTN, HLD, anemia, chronic anxiety/depression.   OT comments  Pt received on her baseline 4 L O2. Session focused on OOB ADL assessment. Once setup with linens, pt able to complete all bathing, dressing and grooming tasks standing/seated at sink Independently. SpO2 >94% throughout with supplemental O2. Pt able to verbalize energy conservation strategies and safety techniques to implement at home. Anticipate pt to return to PLOF quickly with good family support. Updated recs to no OT services needed at DC.    Follow Up Recommendations  No OT follow up;Supervision - Intermittent    Equipment Recommendations  None recommended by OT    Recommendations for Other Services      Precautions / Restrictions Precautions Precautions: Fall;Other (comment) Precaution Comments: Watch SpO2 (wears 4L O2 baseline) Restrictions Weight Bearing Restrictions: No       Mobility Bed Mobility Overal bed mobility: Modified Independent Bed Mobility: Supine to Sit;Sit to Supine     Supine to sit: Modified independent (Device/Increase time) Sit to supine: Modified independent (Device/Increase time)   General bed mobility comments: no assist needed, increased time and use of bedrail    Transfers Overall transfer level: Independent Equipment used: None Transfers: Sit to/from Stand Sit to Stand: Independent         General transfer comment: Independent to stand from bedside and BSC (during ADLs at sink)    Balance Overall balance assessment: Needs assistance Sitting-balance support: No upper extremity supported;Feet supported Sitting balance-Leahy  Scale: Good       Standing balance-Leahy Scale: Fair Standing balance comment: Can static stand without UE support; static and dynamic stability improved with UE support                           ADL either performed or assessed with clinical judgement   ADL Overall ADL's : Needs assistance/impaired     Grooming: Independent;Sitting;Standing;Oral care;Wash/dry face;Brushing hair   Upper Body Bathing: Sitting;Independent   Lower Body Bathing: Modified independent;Sit to/from stand   Upper Body Dressing : Independent;Sitting   Lower Body Dressing: Independent;Sit to/from stand               Functional mobility during ADLs: Supervision/safety General ADL Comments: Guided pt in ADLs seated at sink. After initial setup of linens, pt able to complete all bathing/dressing tasks independently. On baseline 4 L O2 with SpO2 >94%     Vision   Vision Assessment?: No apparent visual deficits   Perception     Praxis      Cognition Arousal/Alertness: Awake/alert Behavior During Therapy: WFL for tasks assessed/performed Overall Cognitive Status: Within Functional Limits for tasks assessed                                          Exercises     Shoulder Instructions       General Comments on baseline 4 L O2 with SpO2 WFL throughout. HR 50s sustained    Pertinent Vitals/ Pain       Pain Assessment: No/denies pain  Home Living  Prior Functioning/Environment              Frequency  Min 2X/week        Progress Toward Goals  OT Goals(current goals can now be found in the care plan section)  Progress towards OT goals: Progressing toward goals  Acute Rehab OT Goals Patient Stated Goal: to reduce oxygen needs and go home OT Goal Formulation: With patient Time For Goal Achievement: 04/20/21 Potential to Achieve Goals: Good ADL Goals Pt Will Transfer to Toilet: with modified  independence;ambulating Pt/caregiver will Perform Home Exercise Program: Increased strength;Both right and left upper extremity;With theraband;Independently;With written HEP provided Additional ADL Goal #1: Pt to increase standing activity tolerance > 5-7 min during daily tasks to maximize endurance  Plan Frequency remains appropriate;Discharge plan needs to be updated    Co-evaluation                 AM-PAC OT "6 Clicks" Daily Activity     Outcome Measure   Help from another person eating meals?: None Help from another person taking care of personal grooming?: None Help from another person toileting, which includes using toliet, bedpan, or urinal?: None Help from another person bathing (including washing, rinsing, drying)?: None Help from another person to put on and taking off regular upper body clothing?: None Help from another person to put on and taking off regular lower body clothing?: None 6 Click Score: 24    End of Session Equipment Utilized During Treatment: Oxygen  OT Visit Diagnosis: Unsteadiness on feet (R26.81);Muscle weakness (generalized) (M62.81)   Activity Tolerance Patient tolerated treatment well   Patient Left in bed;with call bell/phone within reach   Nurse Communication          Time: [id number] OT Time Calculation (min): 40 min  Charges: OT General Charges $OT Visit: 1 Visit OT Treatments $Self Care/Home Management : 23-37 mins  Malachy Chamber, OTR/L Acute Rehab Services Office: [phone]    Layla Maw 04/13/2021, 12:21 PM

## 2021-04-13 NOTE — TOC Transition Note (Signed)
Transition of Care [facility]) - CM/SW Discharge Note   Patient Details  Name: Tina Gomez MRN: [id number] Date of Birth: 14-Mar-1956  [facility] CM/SW Contact:  Zenon Mayo, RN Phone Number: 04/13/2021, 1:15 PM   Clinical Narrative:    NCM spoke with patient, she states she will have transportation home today, she states her family will bring her oxygen tank to go home with.  NCM asked to set up HHPT for her she states she has a paper that physical therapy gave her to go by to do exercises and she can do this on her own , and she does not want HHPT.   She has no other needs.   Final next level of care: Home/Self Care Barriers to Discharge: No Barriers Identified   Patient Goals and CMS Choice Patient states their goals for this hospitalization and ongoing recovery are:: return home      Discharge Placement                       Discharge Plan and Services   Discharge Planning Services: CM Consult              DME Agency: NA       HH Arranged: NA          Social Determinants of Health (SDOH) Interventions     Readmission Risk Interventions Readmission Risk Prevention Plan 04/13/2021 07/28/2020  Transportation Screening Complete Complete  PCP or Specialist Appt within 3-5 Days - Not Complete  Not Complete comments - first available PCP is 12/16  [place] or [place] - Complete  Social Work Consult for [place] Planning/Counseling - Complete  Palliative Care Screening - Not Applicable  Medication Review Press photographer) Complete -  PCP or Specialist appointment within 3-5 days of discharge Complete -  [place] or Home Care Consult Complete -  SW Recovery Care/Counseling Consult Complete -  Palliative Care Screening Not Applicable -  [place] Not Applicable -  Some recent data might be hidden

## 2021-04-13 NOTE — TOC Initial Note (Signed)
Transition of Care [facility]) - Initial/Assessment Note    Patient Details  Name: Tina Gomez MRN: [id number] Date of Birth: 25-Dec-1955  Transition of Care [facility]) CM/SW Contact:    Zenon Mayo, RN Phone Number: 04/13/2021, 1:13 PM  Clinical Narrative:                 NCM spoke with patient, she states she will have transportation home today, she states her family will bring her oxygen tank to go home with.  NCM asked to set up HHPT for her she states she has a paper that physical therapy gave her to go by to do exercises and she can do this on her own , and she does not want HHPT.   She has no other needs.   Expected Discharge Plan: Home/Self Care Barriers to Discharge: No Barriers Identified   Patient Goals and CMS Choice Patient states their goals for this hospitalization and ongoing recovery are:: return home      Expected Discharge Plan and Services Expected Discharge Plan: Home/Self Care   Discharge Planning Services: CM Consult   Living arrangements for the past 2 months: Single Family Home                   DME Agency: NA       HH Arranged: NA          Prior Living Arrangements/Services Living arrangements for the past 2 months: Single Family Home Lives with:: Adult Children Patient language and need for interpreter reviewed:: Yes Do you feel safe going back to the place where you live?: Yes      Need for Family Participation in Patient Care: Yes (Comment) Care giver support system in place?: Yes (comment)   Criminal Activity/Legal Involvement Pertinent to Current Situation/Hospitalization: No - Comment as needed  Activities of Daily Living Home Assistive Devices/Equipment: Eyeglasses ADL Screening (condition at time of admission) Patient's cognitive ability adequate to safely complete daily activities?: Yes Is the patient deaf or have difficulty hearing?: No Does the patient have difficulty seeing, even when wearing glasses/contacts?: No Does  the patient have difficulty concentrating, remembering, or making decisions?: No Patient able to express need for assistance with ADLs?: Yes Does the patient have difficulty dressing or bathing?: Yes Independently performs ADLs?: Yes (appropriate for developmental age) Does the patient have difficulty walking or climbing stairs?: Yes Weakness of Legs: Both Weakness of Arms/Hands: None  Permission Sought/Granted                  Emotional Assessment   Attitude/Demeanor/Rapport: Engaged Affect (typically observed): Appropriate Orientation: : Oriented to Self, Oriented to Place, Oriented to  Time, Oriented to Situation Alcohol / Substance Use: Not Applicable Psych Involvement: No (comment)  Admission diagnosis:  Shortness of breath [R06.02] COPD exacerbation (HCC) [J44.1] Acute on chronic respiratory failure with hypoxia ([place]) [J96.21] COVID-19 [U07.1] Patient Active Problem List   Diagnosis Date Noted   Acute respiratory disease due to COVID-19 virus 04/05/2021   ESRD on hemodialysis ([place]) 04/05/2021   Generalized weakness 04/05/2021   Acute on chronic respiratory failure with hypoxia ([place]) 04/04/2021   Fluctuating blood pressure 03/08/2021   Chronic kidney disease 03/08/2021   Acute pulmonary edema (HCC)    Volume overload 12/13/2020   CAD (coronary artery disease) 12/13/2020   Acute respiratory failure with hypoxia ([place]) 12/13/2020   Shortness of breath    CHF (congestive heart failure) ([place]) 11/07/2020   Protein-calorie malnutrition, severe 07/28/2020   ARF (  acute renal failure) ([place]) 07/23/2020   Diabetic gastroparesis (HCC)    Abnormal finding on GI tract imaging    H. pylori infection    Diverticulitis 05/24/2020   History of stroke 05/24/2020   Anemia 05/24/2020   Nausea and vomiting 05/24/2020   AKI (acute kidney injury) ([place]) 05/24/2020   Dehydration 05/24/2020   Hypomagnesemia 05/24/2020   Hypokalemia 05/24/2020   Tobacco abuse 05/24/2020   Emphysema of  lung ([place]) 05/24/2020   Psoriasis 02/14/2016   Type 2 diabetes mellitus with diabetic neuropathy, unspecified ([place]) 01/18/2014   Essential hypertension, benign 01/18/2014   GERD (gastroesophageal reflux disease) 01/18/2014   Hyperlipidemia 01/18/2014   History of CVA (cerebrovascular accident) 10/22/2013   Malignant hypertension 10/22/2013   PCP:  Charlott Rakes, MD Pharmacy:   [facility] [address] [place] [address] Phone: [phone] Fax: [phone]     Social Determinants of Health (SDOH) Interventions    Readmission Risk Interventions Readmission Risk Prevention Plan 04/13/2021 07/28/2020  Transportation Screening Complete Complete  PCP or Specialist Appt within 3-5 Days - Not Complete  Not Complete comments - first available PCP is 12/16  [place] or [place] - Complete  Social Work Consult for [place] Planning/Counseling - Complete  Palliative Care Screening - Not Applicable  Medication Review Press photographer) Complete -  PCP or Specialist appointment within 3-5 days of discharge Complete -  [place] or [place] Complete -  SW Recovery Care/Counseling Consult Complete -  Palliative Care Screening Not Applicable -  [place] Not Applicable -  Some recent data might be hidden

## 2021-04-13 NOTE — Progress Notes (Signed)
Progress Note    Tina Gomez  [id number] DOB: 1955/09/25  DOA: 04/04/2021 PCP: Charlott Rakes, MD      Brief Narrative:    Medical records reviewed and are as summarized below:  Tina Gomez is a 65 y.o. female with medical history significant for type 2 diabetes mellitus ESRD on HD MWF, COPD, chronic hypoxia on 4 L nasal cannula at baseline, hypertension, hyperlipidemia, anemia of chronic kidney disease, hyperlipidemia, chronic anxiety/depression presented on 04/04/2021 with fever, cough and shortness of breath.  On presentation, her saturations were in the 80s on 4 L nasal cannula.  Chest x-ray showed bilateral pulmonary opacities, multifocal pneumonia.  COVID testing was positive.  She was admitted to the hospital for acute on chronic hypoxic respiratory failure secondary to COVID-19 pneumonia.  She was treated with steroids and required high flow oxygen.  She refused remdesivir.  She was also treated with Actemra.  Nephrologist was consulted for hemodialysis.  She was transferred to [facility] service 04/10/2021.      Assessment/Plan:   Active Problems:   Type 2 diabetes mellitus with diabetic neuropathy, unspecified (HCC)   Essential hypertension, benign   Acute on chronic respiratory failure with hypoxia (HCC)   Acute respiratory disease due to COVID-19 virus   ESRD on hemodialysis (HCC)   Generalized weakness    Body mass index is 28.95 kg/m.   Acute on chronic hypoxic respiratory failure: Improved.  She was down to 3 L/min oxygen via nasal cannula earlier today.  She is now on 5 L/min oxygen. She uses 4 L/min oxygen at baseline.  Continue to taper down oxygen as able.    COVID-19 bilateral pneumonia: S/p treatment with Actemra on 04/10/2021.  Discontinue IV steroids because of hyperglycemia.  She refused remdesivir.  Hypertension, hypertensive urgency, labile hypertension: Discussed plan with nephrologist, Dr. Marval Regal.  No changes will be made to  antihypertensives for now.  COPD: Continue bronchodilators  ESRD: Patient had dialysis today.  I was informed by Dr. Marval Regal, that her dialysis access clotted so she only had 1 hour of dialysis.  She will need fistulogram or permacath prior to discharge per nephrologist.   Type II DM with hyperglycemia: Globin A1c was 6.2.  Discontinue IV Solu-Medrol because of hyperglycemia.  Continue NovoLog as needed for hyperglycemia..   Diet Order             Diet renal with fluid restriction Fluid restriction: 1200 mL Fluid; Room service appropriate? Yes; Fluid consistency: Thin  Diet effective now                      Consultants: Intensivist Nephrologist  Procedures: None    Medications:    vitamin C  500 mg Oral Daily   aspirin  325 mg Oral Daily   atorvastatin  40 mg Oral q1800   calcium acetate  1,334 mg Oral TID WC   carvedilol  6.25 mg Oral BID WC   Chlorhexidine Gluconate Cloth  6 each Topical Q0600   doxercalciferol  1 mcg Intravenous Q M,W,F-HD   FLUoxetine  20 mg Oral Daily   gabapentin  100 mg Oral QHS   heparin  5,000 Units Subcutaneous Q8H   insulin aspart  0-20 Units Subcutaneous TID WC   insulin aspart  0-5 Units Subcutaneous QHS   losartan  25 mg Oral QPM   multivitamin with minerals  1 tablet Oral Daily   zinc sulfate  220 mg Oral Daily  Continuous Infusions:  sodium chloride     sodium chloride       Anti-infectives (From admission, onward)    Start     Dose/Rate Route Frequency Ordered Stop   04/10/21 1015  remdesivir 100 mg in sodium chloride 0.9 % 100 mL IVPB  Status:  Discontinued        100 mg 200 mL/hr over 30 Minutes Intravenous Daily 04/10/21 0923 04/10/21 1204   04/09/21 0915  fluconazole (DIFLUCAN) tablet 200 mg        200 mg Oral  Once 04/09/21 0824 04/09/21 1031   04/06/21 1000  remdesivir 100 mg in sodium chloride 0.9 % 100 mL IVPB  Status:  Discontinued       See Hyperspace for full Linked Orders Report.   100 mg 200 mL/hr  over 30 Minutes Intravenous Daily 04/05/21 0847 04/10/21 0922   04/05/21 1400  piperacillin-tazobactam (ZOSYN) IVPB 2.25 g  Status:  Discontinued       Note to Pharmacy: Zosyn 2.25 g IV q8h in ESRD   2.25 g 100 mL/hr over 30 Minutes Intravenous Every 8 hours 04/05/21 0122 04/05/21 1500   04/05/21 0900  remdesivir 200 mg in sodium chloride 0.9% 250 mL IVPB  Status:  Discontinued       See Hyperspace for full Linked Orders Report.   200 mg 580 mL/hr over 30 Minutes Intravenous Once 04/05/21 0847 04/10/21 0922   04/05/21 0200  piperacillin-tazobactam (ZOSYN) IVPB 3.375 g        3.375 g 100 mL/hr over 30 Minutes Intravenous  Once 04/05/21 0122 04/05/21 0357              Family Communication/Anticipated D/C date and plan/Code Status   DVT prophylaxis: heparin injection 5,000 Units Start: 04/04/21 2330 SCDs Start: 04/04/21 2317     Code Status: Full Code  Family Communication: None Disposition Plan:    Status is: Inpatient  Remains inpatient appropriate because:Inpatient level of care appropriate due to severity of illness  Dispo:  Patient From: Home  Planned Disposition: Home  Medically stable for discharge: No             Subjective:   C/o fatigue.  No shortness of breath or chest pain.  She feels better today.  Objective:    Vitals:   04/13/21 1620 04/13/21 1625 04/13/21 1630 04/13/21 1645  BP: (!) 79/44 (!) 133/58 (!) 125/57 (!) 159/66  Pulse:      Resp: 16 16 (!) 22 15  Temp:      TempSrc:      SpO2:   92% 95%  Weight:      Height:       No data found.   Intake/Output Summary (Last 24 hours) at 04/13/2021 1722 Last data filed at 04/12/2021 2001 Gross per 24 hour  Intake 240 ml  Output --  Net 240 ml   Filed Weights   04/11/21 1500 04/12/21 0143 04/13/21 0617  Weight: 75.3 kg 72.3 kg 74.1 kg    Exam:  GEN: NAD SKIN: Warm and dry EYES: EOMI ENT: MMM CV: RRR PULM: Bibasilar rales.  No wheezing. ABD: soft, ND, NT, +BS CNS: AAO x 3,  non focal EXT: No edema or tenderness       Data Reviewed:   I have personally reviewed following labs and imaging studies:  Labs: Labs show the following:   Basic Metabolic Panel: Recent Labs  Lab 04/07/21 1340 04/08/21 0313 04/09/21 0341 04/10/21 0424 04/11/21 0248  NA  136 135 138 138 136  K 3.7 3.1* 2.9* 3.3* 4.1  CL 97* 95* 100 101 95*  CO2 '23 27 25 26 27  '$ GLUCOSE 155* 182* 155* 114* 199*  BUN 63* 25* 68* 33* 62*  CREATININE 5.14* 2.56* 5.04* 3.28* 5.02*  CALCIUM 8.3* 9.0 8.7* 8.5* 9.2  MG  --   --  2.6*  --   --   PHOS 8.1*  --   --   --   --    GFR Estimated Creatinine Clearance: 10.9 mL/min (A) (by C-G formula based on SCr of 5.02 mg/dL (H)). Liver Function Tests: Recent Labs  Lab 04/07/21 0221 04/07/21 1340 04/08/21 0313 04/09/21 0341 04/10/21 0424 04/11/21 0248  AST 16  --  19 14* 13* 13*  ALT 13  --  '18 13 14 12  '$ ALKPHOS 67  --  81 72 59 59  BILITOT 0.2*  --  0.5 0.3 0.3 0.6  PROT 6.1*  --  7.7 5.9* 6.0* 6.4*  ALBUMIN 2.9* 2.9* 3.6 2.8* 2.8* 3.0*   No results for input(s): LIPASE, AMYLASE in the last 168 hours. No results for input(s): AMMONIA in the last 168 hours. Coagulation profile No results for input(s): INR, PROTIME in the last 168 hours.  CBC: Recent Labs  Lab 04/07/21 0221 04/07/21 1340 04/08/21 0313 04/09/21 0341 04/10/21 0424 04/11/21 0248  WBC 10.4 7.3 8.3 9.7 7.4 5.4  NEUTROABS 9.4*  --  7.5 7.5 5.7 4.4  HGB 9.8* 10.0* 11.6* 10.7* 10.8* 10.9*  HCT 34.0* 33.7* 38.5 35.9* 35.6* 36.5  MCV 92.6 92.8 88.5 89.8 89.0 91.3  PLT 219 210 236 208 194 199   Cardiac Enzymes: No results for input(s): CKTOTAL, CKMB, CKMBINDEX, TROPONINI in the last 168 hours. BNP (last 3 results) No results for input(s): PROBNP in the last 8760 hours. CBG: Recent Labs  Lab 04/12/21 1220 04/12/21 1542 04/12/21 1953 04/13/21 0611 04/13/21 1158  GLUCAP 284* 202* 407* 163* 388*   D-Dimer: Recent Labs    04/11/21 0248  DDIMER 1.06*   Hgb  A1c: No results for input(s): HGBA1C in the last 72 hours. Lipid Profile: No results for input(s): CHOL, HDL, LDLCALC, TRIG, CHOLHDL, LDLDIRECT in the last 72 hours. Thyroid function studies: No results for input(s): TSH, T4TOTAL, T3FREE, THYROIDAB in the last 72 hours.  Invalid input(s): FREET3 Anemia work up: No results for input(s): VITAMINB12, FOLATE, FERRITIN, TIBC, IRON, RETICCTPCT in the last 72 hours. Sepsis Labs: Recent Labs  Lab 04/08/21 0313 04/09/21 0341 04/10/21 0424 04/11/21 0248  WBC 8.3 9.7 7.4 5.4    Microbiology Recent Results (from the past 240 hour(s))  Resp Panel by RT-PCR (Flu A&B, Covid) Nasopharyngeal Swab     Status: Abnormal   Collection Time: 04/04/21  8:55 PM   Specimen: Nasopharyngeal Swab; Nasopharyngeal(NP) swabs in vial transport medium  Result Value Ref Range Status   SARS Coronavirus 2 by RT PCR POSITIVE (A) NEGATIVE Final    Comment: RESULT CALLED TO, READ BACK BY AND VERIFIED WITH: SEEARNARS RN, AT 2334 04/04/21 D. VANHOOK (NOTE) SARS-CoV-2 target nucleic acids are DETECTED.  The SARS-CoV-2 RNA is generally detectable in upper respiratory specimens during the acute phase of infection. Positive results are indicative of the presence of the identified virus, but do not rule out bacterial infection or co-infection with other pathogens not detected by the test. Clinical correlation with patient history and other diagnostic information is necessary to determine patient infection status. The expected result is Negative.  Fact Sheet  for Patients: EntrepreneurPulse.com.au  Fact Sheet for Healthcare Providers: IncredibleEmployment.be  This test is not yet approved or cleared by the [place] FDA and  has been authorized for detection and/or diagnosis of SARS-CoV-2 by FDA under an Emergency Use Authorization (EUA).  This EUA will remain in effect (meaning this test  can be used) for the duration of  the  COVID-19 declaration under Section 564(b)(1) of the Act, 21 U.S.C. section 360bbb-3(b)(1), unless the authorization is terminated or revoked sooner.     Influenza A by PCR NEGATIVE NEGATIVE Final   Influenza B by PCR NEGATIVE NEGATIVE Final    Comment: (NOTE) The Xpert Xpress SARS-CoV-2/FLU/RSV plus assay is intended as an aid in the diagnosis of influenza from Nasopharyngeal swab specimens and should not be used as a sole basis for treatment. Nasal washings and aspirates are unacceptable for Xpert Xpress SARS-CoV-2/FLU/RSV testing.  Fact Sheet for Patients: EntrepreneurPulse.com.au  Fact Sheet for Healthcare Providers: IncredibleEmployment.be  This test is not yet approved or cleared by the [place] FDA and has been authorized for detection and/or diagnosis of SARS-CoV-2 by FDA under an Emergency Use Authorization (EUA). This EUA will remain in effect (meaning this test can be used) for the duration of the COVID-19 declaration under Section 564(b)(1) of the Act, 21 U.S.C. section 360bbb-3(b)(1), unless the authorization is terminated or revoked.  Performed at [facility], [place] [address] [place], [place] [address]     Procedures and diagnostic studies:  No results found.             LOS: 9 days   Caylon Saine  [employer], advertising on [email or web address]. If 7PM-7AM, please contact night-coverage at [email or web address]     04/13/2021, 5:22 PM

## 2021-04-13 NOTE — Plan of Care (Signed)

## 2021-04-14 LAB — CBC
HCT: 36.4 % (ref 36.0–46.0)
Hemoglobin: 10.8 g/dL — ABNORMAL LOW (ref 12.0–15.0)
MCH: 26.4 pg (ref 26.0–34.0)
MCHC: 29.7 g/dL — ABNORMAL LOW (ref 30.0–36.0)
MCV: 89 fL (ref 80.0–100.0)
Platelets: 219 10*3/uL (ref 150–400)
RBC: 4.09 MIL/uL (ref 3.87–5.11)
RDW: 17.4 % — ABNORMAL HIGH (ref 11.5–15.5)
WBC: 7.2 10*3/uL (ref 4.0–10.5)
nRBC: 0 % (ref 0.0–0.2)

## 2021-04-14 LAB — RENAL FUNCTION PANEL
Albumin: 2.8 g/dL — ABNORMAL LOW (ref 3.5–5.0)
Anion gap: 14 (ref 5–15)
BUN: 79 mg/dL — ABNORMAL HIGH (ref 8–23)
CO2: 21 mmol/L — ABNORMAL LOW (ref 22–32)
Calcium: 8.8 mg/dL — ABNORMAL LOW (ref 8.9–10.3)
Chloride: 99 mmol/L (ref 98–111)
Creatinine, Ser: 5.33 mg/dL — ABNORMAL HIGH (ref 0.44–1.00)
GFR, Estimated: 8 mL/min — ABNORMAL LOW (ref >60)
Glucose, Bld: 98 mg/dL (ref 70–99)
Phosphorus: 4.6 mg/dL (ref 2.5–4.6)
Potassium: 3.7 mmol/L (ref 3.5–5.1)
Sodium: 134 mmol/L — ABNORMAL LOW (ref 135–145)

## 2021-04-14 LAB — GLUCOSE, CAPILLARY
Glucose-Capillary: 101 mg/dL — ABNORMAL HIGH (ref 70–99)
Glucose-Capillary: 225 mg/dL — ABNORMAL HIGH (ref 70–99)
Glucose-Capillary: 133 mg/dL — ABNORMAL HIGH (ref 70–99)
Glucose-Capillary: 197 mg/dL — ABNORMAL HIGH (ref 70–99)

## 2021-04-14 MED ORDER — DOXERCALCIFEROL 4 MCG/2ML IV SOLN
INTRAVENOUS | Status: AC
  Administered 2021-04-14: 1 ug via INTRAVENOUS
  Filled 2021-04-14: qty 2

## 2021-04-14 NOTE — Progress Notes (Signed)
Patient ID: Tina Gomez, female   DOB: [birth year], 65 y.o.   MRN: [id number] S:Difficulty with cannulation yesterday so HD stopped early. O:BP (!) 157/60 (BP Location: Right Arm)   Pulse 65   Temp 98.7 F (37.1 C) (Oral)   Resp 18   Ht '5\' 3"'$  (1.6 m)   Wt 73.4 kg   LMP  (LMP Unknown)   SpO2 96%   BMI 28.66 kg/m   Intake/Output Summary (Last 24 hours) at 04/14/2021 0955 Last data filed at 04/14/2021 0500 Gross per 24 hour  Intake 240 ml  Output 130 ml  Net 110 ml   Intake/Output: I/O last 3 completed shifts: In: 480 [P.O.:480] Out: 130 [Urine:200]  Intake/Output this shift:  No intake/output data recorded. Weight change: -0.726 kg Physical exam: unable to complete due to COVID + status.  In order to preserve PPE equipment and to minimize exposure to providers.  Notes from other caregivers reviewed   Recent Labs  Lab 04/07/21 1340 04/08/21 0313 04/09/21 0341 04/10/21 0424 04/11/21 0248 04/14/21 0338  NA 136 135 138 138 136 134*  K 3.7 3.1* 2.9* 3.3* 4.1 3.7  CL 97* 95* 100 101 95* 99  CO2 '23 27 25 26 27 '$ 21*  GLUCOSE 155* 182* 155* 114* 199* 98  BUN 63* 25* 68* 33* 62* 79*  CREATININE 5.14* 2.56* 5.04* 3.28* 5.02* 5.33*  ALBUMIN 2.9* 3.6 2.8* 2.8* 3.0* 2.8*  CALCIUM 8.3* 9.0 8.7* 8.5* 9.2 8.8*  PHOS 8.1*  --   --   --   --  4.6  AST  --  19 14* 13* 13*  --   ALT  --  '18 13 14 12  '$ --    Liver Function Tests: Recent Labs  Lab 04/09/21 0341 04/10/21 0424 04/11/21 0248 04/14/21 0338  AST 14* 13* 13*  --   ALT '13 14 12  '$ --   ALKPHOS 72 59 59  --   BILITOT 0.3 0.3 0.6  --   PROT 5.9* 6.0* 6.4*  --   ALBUMIN 2.8* 2.8* 3.0* 2.8*   No results for input(s): LIPASE, AMYLASE in the last 168 hours. No results for input(s): AMMONIA in the last 168 hours. CBC: Recent Labs  Lab 04/08/21 0313 04/09/21 0341 04/10/21 0424 04/11/21 0248 04/14/21 0338  WBC 8.3 9.7 7.4 5.4 7.2  NEUTROABS 7.5 7.5 5.7 4.4  --   HGB 11.6* 10.7* 10.8* 10.9* 10.8*  HCT 38.5 35.9* 35.6*  36.5 36.4  MCV 88.5 89.8 89.0 91.3 89.0  PLT 236 208 194 199 219   Cardiac Enzymes: No results for input(s): CKTOTAL, CKMB, CKMBINDEX, TROPONINI in the last 168 hours. CBG: Recent Labs  Lab 04/13/21 0611 04/13/21 1158 04/13/21 1856 04/13/21 2157 04/14/21 0643  GLUCAP 163* 388* 114* 240* 101*    Iron Studies: No results for input(s): IRON, TIBC, TRANSFERRIN, FERRITIN in the last 72 hours. Studies/Results: No results found.  vitamin C  500 mg Oral Daily   aspirin  325 mg Oral Daily   atorvastatin  40 mg Oral q1800   calcium acetate  1,334 mg Oral TID WC   carvedilol  6.25 mg Oral BID WC   Chlorhexidine Gluconate Cloth  6 each Topical Q0600   doxercalciferol  1 mcg Intravenous Q M,W,F-HD   FLUoxetine  20 mg Oral Daily   gabapentin  100 mg Oral QHS   heparin  5,000 Units Subcutaneous Q8H   insulin aspart  0-20 Units Subcutaneous TID WC   insulin aspart  0-5 Units Subcutaneous QHS   losartan  25 mg Oral QPM   multivitamin with minerals  1 tablet Oral Daily   zinc sulfate  220 mg Oral Daily    BMET    Component Value Date/Time   NA 134 (L) 04/14/2021 0338   NA 141 11/24/2020 1304   K 3.7 04/14/2021 0338   CL 99 04/14/2021 0338   CO2 21 (L) 04/14/2021 0338   GLUCOSE 98 04/14/2021 0338   BUN 79 (H) 04/14/2021 0338   BUN 47 (H) 11/24/2020 1304   CREATININE 5.33 (H) 04/14/2021 0338   CREATININE 1.25 (H) 11/13/2016 1018   CALCIUM 8.8 (L) 04/14/2021 0338   CALCIUM 5.8 (LL) 07/24/2020 0343   GFRNONAA 8 (L) 04/14/2021 0338   GFRNONAA 47 (L) 11/13/2016 1018   GFRAA 13 (L) 06/15/2020 1134   GFRAA 54 (L) 11/13/2016 1018   CBC    Component Value Date/Time   WBC 7.2 04/14/2021 0338   RBC 4.09 04/14/2021 0338   HGB 10.8 (L) 04/14/2021 0338   HGB 9.5 (L) 02/06/2021 0927   HCT 36.4 04/14/2021 0338   HCT 31.3 (L) 02/06/2021 0927   PLT 219 04/14/2021 0338   PLT 299 02/06/2021 0927   MCV 89.0 04/14/2021 0338   MCV 93 02/06/2021 0927   MCH 26.4 04/14/2021 0338   MCHC 29.7  (L) 04/14/2021 0338   RDW 17.4 (H) 04/14/2021 0338   RDW 15.8 (H) 02/06/2021 0927   LYMPHSABS 0.5 (L) 04/11/2021 0248   LYMPHSABS 1.6 02/06/2021 0927   MONOABS 0.3 04/11/2021 0248   EOSABS 0.0 04/11/2021 0248   EOSABS 0.2 02/06/2021 0927   BASOSABS 0.0 04/11/2021 0248   BASOSABS 0.0 02/06/2021 0927       Dialysis:  MWFS  [facility]  3h  400/500  77kg  3K/2.5Ca bath  LUA AVF  Hep 5000  Mircera 225 mcg q2wks - last 7/18 Venofer '100mg'$  qHD x10 (2 completed) Hectorol 51mg IV qHD   Assessment/Plan: Vascular access - she has not had issues during this hospitalization until yesterday.  Will retry today and if unable to perform HD will delay discharge and consult IR for fistulogram.  If able to perform HD today, should be ok for discharge after HD.   COVID PNA- tested positive on 7/27. Per PMD-   actemra and steroids, pt refused remdesivir. Acute on chronic respiratory failure - 2/2 #1 & likely superimposed by bacterial PNA and vol excess/ pulm edema. On 4L O2 at home and currently able to wean down to 3 liters [place].  Able to UF below EDW and will continue to challenge with HD in hopes of improving oxygen requirements.  Currently on 4 liters via [place] which is her home dose.  ESRD -  on HD MWFSat for 3h.  HD again today and UF as above.    Hypertension/volume  - Blood pressure elevated.  Usually takes midodrine '10mg'$  pre HD > started '10mg'$  tid here. Continue home meds-  coreg 6.25, losartan 25-  doses decreased, others as needed.  Midodrine was stopped yesterday due to elevated BP's.  Will need lower EDW at discharge.  She is 73.4kg today (4 kg below edw)  Anemia of CKD -  Will hold iron in setting of acute infection.  ESA due 8/1-  hgb 10.8-  will hold for now  Secondary Hyperparathyroidism -  Ca in goal. Phos high, not unusual. Continue VDRA (hect) and phoslo 2 AC TID  Nutrition - Renal diet w/fluid restrictions  JBroadus John  Denice Paradise, MD [employer] [phone]

## 2021-04-14 NOTE — Progress Notes (Signed)
Palliative Medicine Inpatient Follow Up Note  HPI:  Per intake H&P --> Tina Gomez is a 65 y.o. female with medical history significant for ESRD on HD MWF, COPD, chronic hypoxia on 4 L nasal cannula at baseline, hypertension, hyperlipidemia, anemia of chronic kidney disease, hyperlipidemia, chronic anxiety/depression, who presented to [facility] ED from home due to fever 102, cough, shortness of breath.  Onset on 04/03/2021, she took a COVID-19 screening test at home which returned negative.  She went to hemodialysis the following day with persistent symptoms, completed hemodialysis, returned home and took a COVID-19 test which returned positive.  EMS was called.  Upon EMS arrival oxygen saturation in the 80s on 4 L [place].  Oxygen supplementation increased to 6 L with O2 saturation in the low 90s.  She was brought into the ED for further evaluation.  Wheezing on exam, endorses feeling very weak and tired.  Chest x-ray revealing bilateral pulmonary opacities.  EDP contacted nephrology who recommended treatment of underlying conditions, multifocal pneumonia.  COVID-19 screening test positive on 04/04/2021.  Patient admitted to hospitalist team for acute on chronic hypoxic respiratory failure secondary to COVID-19 viral pneumonia likely superimposed by bacterial pneumonia.   Palliative care was asked to get involved in goals of care conversations.   Today's Discussion (04/14/2021):  *Please note that this is a verbal dictation therefore any spelling or grammatical errors are due to the "[employer]" system interpretation.  I met at bedside with Tina Gomez this morning. She shares with me that she was supposed to discharge home yesterday but had a tough HD session last night thought to be r/t a clot in her fistula. We reviewed that this will need to be corrected prior to her discharging. [name] expresses increased ease of breathing and decrease in overall O2 needs back to her baseline. We reviewed the improvements  that she is seeing.   [name] shares that she has completed the advance directives and plans to get them notarized upon discharge. We reviewed that she has had the opportunity to speak to her daughter in regards to her desires.  [name] denies additional questions for the palliative team at this time.  Objective Assessment: Vital Signs Vitals:   04/13/21 2017 04/14/21 0541  BP: (!) 179/68 (!) 157/60  Pulse: (!) 58 65  Resp: 14 18  Temp: 98 F (36.7 C) 98.7 F (37.1 C)  SpO2: 91% 96%    Intake/Output Summary (Last 24 hours) at 04/14/2021 1039 Last data filed at 04/14/2021 0500 Gross per 24 hour  Intake 240 ml  Output 130 ml  Net 110 ml   Last Weight  Most recent update: 04/14/2021  5:51 AM    Weight  73.4 kg (161 lb 12.8 oz)            Gen:  Middle age caucasian F in NAD HEENT: moist mucous membranes CV: Regular rate and rhythm PULM: On 4LPM HFNC ABD: soft/nontender WER:[id number] edema, ecchymotic upper extremities Neuro: Alert and oriented x3  SUMMARY OF RECOMMENDATIONS   Full code/full scope of care    Advance directives complete   Patient has a very realistic view of her present comorbidities and overall illness trajectory   Goals are for continued improvement   Ongoing incremental palliative care support   Time Spent: 25 Greater than 50% of the time was spent in counseling and coordination of care ______________________________________________________________________________________ [place] Team Team Cell Phone: [phone] Please utilize secure chat with additional questions, if there  is no response within 30 minutes please call the above phone number  Palliative Medicine Team providers are available by phone from 7am to 7pm daily and can be reached through the team cell phone.  Should this patient require assistance outside of these hours, please call the patient's attending physician.

## 2021-04-14 NOTE — Progress Notes (Signed)
Progress Note    Tina Gomez  [id number] DOB: July 01, 1956  DOA: 04/04/2021 PCP: Charlott Rakes, MD      Brief Narrative:    Medical records reviewed and are as summarized below:  Tina Gomez is a 65 y.o. female with medical history significant for type 2 diabetes mellitus ESRD on HD MWF, COPD, chronic hypoxia on 4 L nasal cannula at baseline, hypertension, hyperlipidemia, anemia of chronic kidney disease, hyperlipidemia, chronic anxiety/depression presented on 04/04/2021 with fever, cough and shortness of breath.  On presentation, her saturations were in the 80s on 4 L nasal cannula.  Chest x-ray showed bilateral pulmonary opacities, multifocal pneumonia.  COVID testing was positive.  She was admitted to the hospital for acute on chronic hypoxic respiratory failure secondary to COVID-19 pneumonia.  She was treated with steroids and required high flow oxygen.  She refused remdesivir.  She was also treated with Actemra.  Nephrologist was consulted for hemodialysis.  She was transferred to [facility] service 04/10/2021.      Assessment/Plan:   Active Problems:   Type 2 diabetes mellitus with diabetic neuropathy, unspecified (HCC)   Essential hypertension, benign   Acute on chronic respiratory failure with hypoxia (HCC)   Acute respiratory disease due to COVID-19 virus   ESRD on hemodialysis (HCC)   Generalized weakness    Body mass index is 28.9 kg/m.   Acute on chronic hypoxic respiratory failure: Improved.  She is requiring 5 L/min oxygen via nasal cannula.  She uses home O2.  Still at baseline.  She uses 4 L/min oxygen at baseline.  Continue to taper down oxygen as able.    COVID-19 bilateral pneumonia: S/p treatment with Actemra on 04/10/2021.  Steroids were discontinued on 04/13/2021 because of hyperglycemia.  She refused remdesivir.  Hypertension, hypertensive urgency, labile hypertension, recent hypotension: Continue antihypertensives.  COPD: Continue  bronchodilators  ESRD: Plan for hemodialysis today with dialysis access.  She will be discharged home today if dialysis is successful and there are no issues with dialysis access.  Vascular surgeon or IR will be consulted for dialysis access if she has any issues with dialysis access.  Type II DM with hyperglycemia: Hemoglobin A1c was 6.2.  Continue NovoLog as needed for hyperglycemia..   Diet Order             Diet renal with fluid restriction Fluid restriction: 1200 mL Fluid; Room service appropriate? Yes; Fluid consistency: Thin  Diet effective now                      Consultants: Intensivist Nephrologist  Procedures: None    Medications:    vitamin C  500 mg Oral Daily   aspirin  325 mg Oral Daily   atorvastatin  40 mg Oral q1800   calcium acetate  1,334 mg Oral TID WC   carvedilol  6.25 mg Oral BID WC   Chlorhexidine Gluconate Cloth  6 each Topical Q0600   doxercalciferol       doxercalciferol  1 mcg Intravenous Q M,W,F-HD   FLUoxetine  20 mg Oral Daily   gabapentin  100 mg Oral QHS   heparin  5,000 Units Subcutaneous Q8H   insulin aspart  0-20 Units Subcutaneous TID WC   insulin aspart  0-5 Units Subcutaneous QHS   losartan  25 mg Oral QPM   multivitamin with minerals  1 tablet Oral Daily   zinc sulfate  220 mg Oral Daily   Continuous Infusions:  sodium chloride     sodium chloride       Anti-infectives (From admission, onward)    Start     Dose/Rate Route Frequency Ordered Stop   04/10/21 1015  remdesivir 100 mg in sodium chloride 0.9 % 100 mL IVPB  Status:  Discontinued        100 mg 200 mL/hr over 30 Minutes Intravenous Daily 04/10/21 0923 04/10/21 1204   04/09/21 0915  fluconazole (DIFLUCAN) tablet 200 mg        200 mg Oral  Once 04/09/21 0824 04/09/21 1031   04/06/21 1000  remdesivir 100 mg in sodium chloride 0.9 % 100 mL IVPB  Status:  Discontinued       See Hyperspace for full Linked Orders Report.   100 mg 200 mL/hr over 30 Minutes  Intravenous Daily 04/05/21 0847 04/10/21 0922   04/05/21 1400  piperacillin-tazobactam (ZOSYN) IVPB 2.25 g  Status:  Discontinued       Note to Pharmacy: Zosyn 2.25 g IV q8h in ESRD   2.25 g 100 mL/hr over 30 Minutes Intravenous Every 8 hours 04/05/21 0122 04/05/21 1500   04/05/21 0900  remdesivir 200 mg in sodium chloride 0.9% 250 mL IVPB  Status:  Discontinued       See Hyperspace for full Linked Orders Report.   200 mg 580 mL/hr over 30 Minutes Intravenous Once 04/05/21 0847 04/10/21 0922   04/05/21 0200  piperacillin-tazobactam (ZOSYN) IVPB 3.375 g        3.375 g 100 mL/hr over 30 Minutes Intravenous  Once 04/05/21 0122 04/05/21 0357              Family Communication/Anticipated D/C date and plan/Code Status   DVT prophylaxis: heparin injection 5,000 Units Start: 04/04/21 2330 SCDs Start: 04/04/21 2317     Code Status: Full Code  Family Communication: None Disposition Plan:    Status is: Inpatient  Remains inpatient appropriate because:Inpatient level of care appropriate due to severity of illness  Dispo:  Patient From: Home  Planned Disposition: Home  Medically stable for discharge: No             Subjective:   Multiple events noted.  C/o fatigue.  She says she feels okay.  She is just tired.  Objective:    Vitals:   04/14/21 0550 04/14/21 1315 04/14/21 1324 04/14/21 1330  BP:  (!) 160/70 (!) 142/66 (!) 152/63  Pulse:  (!) 57 (!) 56 (!) 55  Resp:  '15 16 15  '$ Temp:  97.8 F (36.6 C)    TempSrc:  Oral    SpO2:  96%    Weight: 73.4 kg 74 kg    Height:       No data found.   Intake/Output Summary (Last 24 hours) at 04/14/2021 1352 Last data filed at 04/14/2021 0500 Gross per 24 hour  Intake 240 ml  Output 130 ml  Net 110 ml   Filed Weights   04/13/21 0617 04/14/21 0550 04/14/21 1315  Weight: 74.1 kg 73.4 kg 74 kg    Exam:  GEN: NAD SKIN: Warm and dry EYES: EOMI ENT: MMM CV: RRR PULM: Bibasilar rales.  No wheezing heard. ABD:  soft, ND, NT, +BS CNS: AAO x 3, non focal EXT: No edema or tenderness       Data Reviewed:   I have personally reviewed following labs and imaging studies:  Labs: Labs show the following:   Basic Metabolic Panel: Recent Labs  Lab 04/08/21 0313 04/09/21 0341 04/10/21  0424 04/11/21 0248 04/14/21 0338  NA 135 138 138 136 134*  K 3.1* 2.9* 3.3* 4.1 3.7  CL 95* 100 101 95* 99  CO2 '27 25 26 27 '$ 21*  GLUCOSE 182* 155* 114* 199* 98  BUN 25* 68* 33* 62* 79*  CREATININE 2.56* 5.04* 3.28* 5.02* 5.33*  CALCIUM 9.0 8.7* 8.5* 9.2 8.8*  MG  --  2.6*  --   --   --   PHOS  --   --   --   --  4.6   GFR Estimated Creatinine Clearance: 10.3 mL/min (A) (by C-G formula based on SCr of 5.33 mg/dL (H)). Liver Function Tests: Recent Labs  Lab 04/08/21 0313 04/09/21 0341 04/10/21 0424 04/11/21 0248 04/14/21 0338  AST 19 14* 13* 13*  --   ALT '18 13 14 12  '$ --   ALKPHOS 81 72 59 59  --   BILITOT 0.5 0.3 0.3 0.6  --   PROT 7.7 5.9* 6.0* 6.4*  --   ALBUMIN 3.6 2.8* 2.8* 3.0* 2.8*   No results for input(s): LIPASE, AMYLASE in the last 168 hours. No results for input(s): AMMONIA in the last 168 hours. Coagulation profile No results for input(s): INR, PROTIME in the last 168 hours.  CBC: Recent Labs  Lab 04/08/21 0313 04/09/21 0341 04/10/21 0424 04/11/21 0248 04/14/21 0338  WBC 8.3 9.7 7.4 5.4 7.2  NEUTROABS 7.5 7.5 5.7 4.4  --   HGB 11.6* 10.7* 10.8* 10.9* 10.8*  HCT 38.5 35.9* 35.6* 36.5 36.4  MCV 88.5 89.8 89.0 91.3 89.0  PLT 236 208 194 199 219   Cardiac Enzymes: No results for input(s): CKTOTAL, CKMB, CKMBINDEX, TROPONINI in the last 168 hours. BNP (last 3 results) No results for input(s): PROBNP in the last 8760 hours. CBG: Recent Labs  Lab 04/13/21 1158 04/13/21 1856 04/13/21 2157 04/14/21 0643 04/14/21 1215  GLUCAP 388* 114* 240* 101* 225*   D-Dimer: No results for input(s): DDIMER in the last 72 hours.  Hgb A1c: No results for input(s): HGBA1C in the  last 72 hours. Lipid Profile: No results for input(s): CHOL, HDL, LDLCALC, TRIG, CHOLHDL, LDLDIRECT in the last 72 hours. Thyroid function studies: No results for input(s): TSH, T4TOTAL, T3FREE, THYROIDAB in the last 72 hours.  Invalid input(s): FREET3 Anemia work up: No results for input(s): VITAMINB12, FOLATE, FERRITIN, TIBC, IRON, RETICCTPCT in the last 72 hours. Sepsis Labs: Recent Labs  Lab 04/09/21 0341 04/10/21 0424 04/11/21 0248 04/14/21 0338  WBC 9.7 7.4 5.4 7.2    Microbiology Recent Results (from the past 240 hour(s))  Resp Panel by RT-PCR (Flu A&B, Covid) Nasopharyngeal Swab     Status: Abnormal   Collection Time: 04/04/21  8:55 PM   Specimen: Nasopharyngeal Swab; Nasopharyngeal(NP) swabs in vial transport medium  Result Value Ref Range Status   SARS Coronavirus 2 by RT PCR POSITIVE (A) NEGATIVE Final    Comment: RESULT CALLED TO, READ BACK BY AND VERIFIED WITH: SEEARNARS RN, AT 2334 04/04/21 D. VANHOOK (NOTE) SARS-CoV-2 target nucleic acids are DETECTED.  The SARS-CoV-2 RNA is generally detectable in upper respiratory specimens during the acute phase of infection. Positive results are indicative of the presence of the identified virus, but do not rule out bacterial infection or co-infection with other pathogens not detected by the test. Clinical correlation with patient history and other diagnostic information is necessary to determine patient infection status. The expected result is Negative.  Fact Sheet for Patients: EntrepreneurPulse.com.au  Fact Sheet for Healthcare Providers: IncredibleEmployment.be  This test is not yet approved or cleared by the [place] and  has been authorized for detection and/or diagnosis of SARS-CoV-2 by FDA under an Emergency Use Authorization (EUA).  This EUA will remain in effect (meaning this test  can be used) for the duration of  the COVID-19 declaration under Section 564(b)(1)  of the Act, 21 U.S.C. section 360bbb-3(b)(1), unless the authorization is terminated or revoked sooner.     Influenza A by PCR NEGATIVE NEGATIVE Final   Influenza B by PCR NEGATIVE NEGATIVE Final    Comment: (NOTE) The Xpert Xpress SARS-CoV-2/FLU/RSV plus assay is intended as an aid in the diagnosis of influenza from Nasopharyngeal swab specimens and should not be used as a sole basis for treatment. Nasal washings and aspirates are unacceptable for Xpert Xpress SARS-CoV-2/FLU/RSV testing.  Fact Sheet for Patients: EntrepreneurPulse.com.au  Fact Sheet for Healthcare Providers: IncredibleEmployment.be  This test is not yet approved or cleared by the [place] FDA and has been authorized for detection and/or diagnosis of SARS-CoV-2 by FDA under an Emergency Use Authorization (EUA). This EUA will remain in effect (meaning this test can be used) for the duration of the COVID-19 declaration under Section 564(b)(1) of the Act, 21 U.S.C. section 360bbb-3(b)(1), unless the authorization is terminated or revoked.  Performed at [facility], [place] [address], [place], [place] [address]     Procedures and diagnostic studies:  No results found.             LOS: 10 days   Deante Blough  [facility]   Pager on [email or web address]. If 7PM-7AM, please contact night-coverage at [email or web address]     04/14/2021, 1:52 PM

## 2021-04-15 LAB — GLUCOSE, CAPILLARY
Glucose-Capillary: 96 mg/dL (ref 70–99)
Glucose-Capillary: 215 mg/dL — ABNORMAL HIGH (ref 70–99)

## 2021-04-15 MED ORDER — GABAPENTIN 100 MG PO CAPS: 100.0000 mg | ORAL_CAPSULE | Freq: Every day | ORAL | Status: DC

## 2021-04-15 MED ORDER — LOPERAMIDE HCL 2 MG PO CAPS
4.0000 mg | ORAL_CAPSULE | Freq: Once | ORAL | Status: AC
  Administered 2021-04-15: 4 mg via ORAL
  Filled 2021-04-15: qty 2

## 2021-04-15 MED ORDER — CARVEDILOL 12.5 MG PO TABS: 12.5000 mg | ORAL_TABLET | Freq: Two times a day (BID) | ORAL | Status: DC

## 2021-04-15 MED ORDER — HYDROXYZINE HCL 25 MG PO TABS: 25.0000 mg | ORAL_TABLET | Freq: Three times a day (TID) | ORAL | Status: DC | PRN

## 2021-04-15 MED ORDER — ATORVASTATIN CALCIUM 40 MG PO TABS
40.0000 mg | ORAL_TABLET | Freq: Every day | ORAL | Status: DC
Start: 2021-04-15 — End: 2021-12-03

## 2021-04-15 MED ORDER — MIDODRINE HCL 10 MG PO TABS: 10.0000 mg | ORAL_TABLET | ORAL | Status: DC

## 2021-04-15 NOTE — Discharge Summary (Signed)
 Physician Discharge Summary  Tina Gomez MRN:[id number] DOB: 08/31/1956 DOA: 04/04/2021  PCP: Newlin, Enobong, MD  Admit date: 04/04/2021 Discharge date: 04/15/2021  Discharge disposition: Home   Recommendations for Outpatient Follow-Up:   Follow-up with PCP in 1 week.  Discharge Diagnosis:   Active Problems:   Type 2 diabetes mellitus with diabetic neuropathy, unspecified (HCC)   Essential hypertension, benign   Acute on chronic respiratory failure with hypoxia (HCC)   Acute respiratory disease due to COVID-19 virus   ESRD on hemodialysis (HCC)   Generalized weakness    Discharge Condition: Stable.  Diet recommendation:  Diet Order             Diet - low sodium heart healthy           Diet renal with fluid restriction Fluid restriction: 1200 mL Fluid; Room service appropriate? Yes; Fluid consistency: Thin  Diet effective now                     Code Status: Full Code     Hospital Course:   Tina Gomez is a 65 y.o. female with medical history significant for type 2 diabetes mellitus ESRD on HD MWF, COPD, chronic hypoxia on 4 L nasal cannula at baseline, hypertension, hyperlipidemia, anemia of chronic kidney disease, hyperlipidemia, chronic anxiety/depression presented on 04/04/2021 with fever, cough and shortness of breath.  On presentation, her saturations were in the 80s on 4 L nasal cannula.  Chest x-ray showed bilateral pulmonary opacities, multifocal pneumonia.  COVID testing was positive.  She was admitted to the hospital for acute on chronic hypoxic respiratory failure secondary to COVID-19 pneumonia.   She was treated with steroids and required high flow oxygen.  She refused remdesivir.  She was also treated with Actemra.  Nephrologist was consulted for hemodialysis.  She was transferred to [facility] service 04/10/2021.  Her condition slowly improved.  She is deemed stable for discharge to home today.      Medical Consultants:    Nephrologist Intensivist   Discharge Exam:    Vitals:   04/15/21 0700 04/15/21 0800 04/15/21 0900 04/15/21 1000  BP: (!) 148/60 (!) 153/87 (!) 139/46 (!) 152/56  Pulse: (!) 55 60 (!) 57 (!) 59  Resp: 12 17 18 14  Temp: 98 F (36.7 C)     TempSrc: Oral     SpO2: 94% 92% 96% 94%  Weight:      Height:         GEN: NAD SKIN: Warm and dry EYES: No pallor or icterus ENT: MMM CV: RRR PULM: Bibasilar rales.  No wheezing heard ABD: soft, ND, NT, +BS CNS: AAO x 3, non focal EXT: No edema or tenderness   The results of significant diagnostics from this hospitalization (including imaging, microbiology, ancillary and laboratory) are listed below for reference.     Procedures and Diagnostic Studies:   DG Chest Portable 1 View  Result Date: 04/04/2021 CLINICAL DATA:  Shortness of breath EXAM: PORTABLE CHEST 1 VIEW COMPARISON:  Radiograph 02/20/2021, chest CT 04/29/2009 FINDINGS: Enlarged cardiac silhouette. There is diffuse interstitial in bilateral lower lung airspace opacities. There are small bilateral pleural effusions with adjacent atelectasis. There is no visible pneumothorax. There is no acute osseous abnormality. IMPRESSION: Cardiomegaly with mild-to-moderate pulmonary edema and small bilateral pleural effusions with adjacent atelectasis. Electronically Signed   By: Jacob  Kahn   On: 04/04/2021 21:09     Labs:   Basic Metabolic Panel: Recent Labs    Lab 04/09/21 0341 04/10/21 0424 04/11/21 0248 04/14/21 0338  NA 138 138 136 134*  K 2.9* 3.3* 4.1 3.7  CL 100 101 95* 99  CO2 _0 21*  GLUCOSE 155* 114* 199* 98  BUN 68* 33* 62* 79*  CREATININE 5.04* 3.28* 5.02* 5.33*  CALCIUM 8.7* 8.5* 9.2 8.8*  MG 2.6*  --   --   --   PHOS  --   --   --  4.6   GFR Estimated Creatinine Clearance: 10 mL/min (A) (by C-G formula based on SCr of 5.33 mg/dL (H)). Liver Function Tests: Recent Labs  Lab 04/09/21 0341 04/10/21 0424 04/11/21 0248 04/14/21 0338  AST 14* 13*  13*  --   ALT _1 --   ALKPHOS 72 59 59  --   BILITOT 0.3 0.3 0.6  --   PROT 5.9* 6.0* 6.4*  --   ALBUMIN 2.8* 2.8* 3.0* 2.8*   No results for input(s): LIPASE, AMYLASE in the last 168 hours. No results for input(s): AMMONIA in the last 168 hours. Coagulation profile No results for input(s): INR, PROTIME in the last 168 hours.  CBC: Recent Labs  Lab 04/09/21 0341 04/10/21 0424 04/11/21 0248 04/14/21 0338  WBC 9.7 7.4 5.4 7.2  NEUTROABS 7.5 5.7 4.4  --   HGB 10.7* 10.8* 10.9* 10.8*  HCT 35.9* 35.6* 36.5 36.4  MCV 89.8 89.0 91.3 89.0  PLT 208 194 199 219   Cardiac Enzymes: No results for input(s): CKTOTAL, CKMB, CKMBINDEX, TROPONINI in the last 168 hours. BNP: Invalid input(s): POCBNP CBG: Recent Labs  Lab 04/14/21 0643 04/14/21 1215 04/14/21 1714 04/14/21 2108 04/15/21 0559  GLUCAP 101* 225* 133* 197* 96   D-Dimer No results for input(s): DDIMER in the last 72 hours. Hgb A1c No results for input(s): HGBA1C in the last 72 hours. Lipid Profile No results for input(s): CHOL, HDL, LDLCALC, TRIG, CHOLHDL, LDLDIRECT in the last 72 hours. Thyroid function studies No results for input(s): TSH, T4TOTAL, T3FREE, THYROIDAB in the last 72 hours.  Invalid input(s): FREET3 Anemia work up No results for input(s): VITAMINB12, FOLATE, FERRITIN, TIBC, IRON, RETICCTPCT in the last 72 hours. Microbiology No results found for this or any previous visit (from the past 240 hour(s)).   Discharge Instructions:   Discharge Instructions     Diet - low sodium heart healthy   Complete by: As directed    Increase activity slowly   Complete by: As directed       Allergies as of 04/15/2021       Reactions   Codeine Nausea And Vomiting   Adhesive [tape] Other (See Comments)   Tape breaks out the skin if it is left on for a lengthy period of time        Medication List     STOP taking these medications    bacitracin-polymyxin b ophthalmic ointment Commonly known  as: POLYSPORIN   metoCLOPramide 5 MG tablet Commonly known as: REGLAN       TAKE these medications    acetaminophen 500 MG tablet Commonly known as: TYLENOL Take 1,000 mg by mouth every 6 (six) hours as needed for mild pain or headache.   aspirin 325 MG tablet Take 325 mg by mouth daily.   atorvastatin 40 MG tablet Commonly known as: LIPITOR Take 1 tablet (40 mg total) by mouth daily at 6 PM.   blood glucose meter kit and supplies Use as instructed   True Metrix Meter Devi 1 each  by Does not apply route 3 (three) times daily before meals.   calcium acetate 667 MG capsule Commonly known as: PHOSLO Take 1,334 mg by mouth in the morning, at noon, in the evening, and at bedtime. With meals and a snack   carvedilol 12.5 MG tablet Commonly known as: COREG Take 1 tablet (12.5 mg total) by mouth 2 (two) times daily with a meal.   DIALYVITE 800 WITH ZINC 0.8 MG Tabs Take 1 tablet by mouth at bedtime.   FLUoxetine 20 MG capsule Commonly known as: PROzac Take 1 capsule (20 mg total) by mouth daily.   freestyle lancets Use as instructed   TRUEplus Lancets 28G Misc 1 each by Does not apply route 3 (three) times daily before meals.   gabapentin 100 MG capsule Commonly known as: NEURONTIN Take 1 capsule (100 mg total) by mouth at bedtime.   hydrOXYzine 25 MG tablet Commonly known as: ATARAX/VISTARIL Take 1 tablet (25 mg total) by mouth 3 (three) times daily as needed for anxiety.   lidocaine-prilocaine cream Commonly known as: EMLA Apply 1 application topically See admin instructions. For use on dialysis days.   losartan 50 MG tablet Commonly known as: COZAAR Take 50 mg by mouth every evening.   midodrine 10 MG tablet Commonly known as: PROAMATINE Take 1 tablet (10 mg total) by mouth See admin instructions. Monday ,Wednesday and Friday. As needed if blood pressure is low.   Misc. Devices Misc Large depends. Diagnosis - fecal incontinence   OXYGEN Inhale 3 L  into the lungs continuous.   ProAir HFA 108 (90 Base) MCG/ACT inhaler Generic drug: albuterol Inhale 2 puffs into the lungs every 6 (six) hours as needed for wheezing or shortness of breath.   albuterol (2.5 MG/3ML) 0.083% nebulizer solution Commonly known as: PROVENTIL Take 3 mLs (2.5 mg total) by nebulization every 6 (six) hours as needed for wheezing or shortness of breath.   Spiriva Respimat 2.5 MCG/ACT Aers Generic drug: Tiotropium Bromide Monohydrate Inhale 2 puffs into the lungs daily.   TRUEtest Test test strip Generic drug: glucose blood Use as instructed          Time coordinating discharge: 32 minutes  Signed:  Warnie Belair  [facility] 04/15/2021, 10:55 AM   Pager on [email or web address]. If 7PM-7AM, please contact night-coverage at [email or web address]

## 2021-04-15 NOTE — Progress Notes (Signed)
   Palliative Medicine Inpatient Follow Up Note  Per chart review plan for discharge today. Palliative objectives well defined. No additional needs presently from a Palliative Care perspective. We will sign off.  No Charge ______________________________________________________________________________________ [place] Team Team Cell Phone: [phone] Please utilize secure chat with additional questions, if there is no response within 30 minutes please call the above phone number  Palliative Medicine Team providers are available by phone from 7am to 7pm daily and can be reached through the team cell phone.  Should this patient require assistance outside of these hours, please call the patient's attending physician.

## 2021-04-15 NOTE — Progress Notes (Signed)
Patient ID: Tina Gomez, female   DOB: May 05, 1956, 65 y.o.   MRN: [id number] S: Able to use AVF without issues yesterday O:BP (!) 152/56 (BP Location: Right Arm)   Pulse (!) 59   Temp 98 F (36.7 C) (Oral)   Resp 14   Ht '5\' 3"'$  (1.6 m)   Wt 70.4 kg Comment: scale b  LMP  (LMP Unknown)   SpO2 94%   BMI 27.49 kg/m   Intake/Output Summary (Last 24 hours) at 04/15/2021 1030 Last data filed at 04/15/2021 0900 Gross per 24 hour  Intake 460 ml  Output 1317 ml  Net -857 ml   Intake/Output: I/O last 3 completed shifts: In: 480 [P.O.:480] Out: 1517 [Urine:200; [id number]  Intake/Output this shift:  Total I/O In: 220 [P.O.:220] Out: -  Weight change: 0.608 kg Physical exam: unable to complete due to COVID + status.  In order to preserve PPE equipment and to minimize exposure to providers.  Notes from other caregivers reviewed   Recent Labs  Lab 04/09/21 0341 04/10/21 0424 04/11/21 0248 04/14/21 0338  NA 138 138 136 134*  K 2.9* 3.3* 4.1 3.7  CL 100 101 95* 99  CO2 '25 26 27 '$ 21*  GLUCOSE 155* 114* 199* 98  BUN 68* 33* 62* 79*  CREATININE 5.04* 3.28* 5.02* 5.33*  ALBUMIN 2.8* 2.8* 3.0* 2.8*  CALCIUM 8.7* 8.5* 9.2 8.8*  PHOS  --   --   --  4.6  AST 14* 13* 13*  --   ALT '13 14 12  '$ --    Liver Function Tests: Recent Labs  Lab 04/09/21 0341 04/10/21 0424 04/11/21 0248 04/14/21 0338  AST 14* 13* 13*  --   ALT '13 14 12  '$ --   ALKPHOS 72 59 59  --   BILITOT 0.3 0.3 0.6  --   PROT 5.9* 6.0* 6.4*  --   ALBUMIN 2.8* 2.8* 3.0* 2.8*   No results for input(s): LIPASE, AMYLASE in the last 168 hours. No results for input(s): AMMONIA in the last 168 hours. CBC: Recent Labs  Lab 04/09/21 0341 04/10/21 0424 04/11/21 0248 04/14/21 0338  WBC 9.7 7.4 5.4 7.2  NEUTROABS 7.5 5.7 4.4  --   HGB 10.7* 10.8* 10.9* 10.8*  HCT 35.9* 35.6* 36.5 36.4  MCV 89.8 89.0 91.3 89.0  PLT 208 194 199 219   Cardiac Enzymes: No results for input(s): CKTOTAL, CKMB, CKMBINDEX, TROPONINI in the  last 168 hours. CBG: Recent Labs  Lab 04/14/21 0643 04/14/21 1215 04/14/21 1714 04/14/21 2108 04/15/21 0559  GLUCAP 101* 225* 133* 197* 96    Iron Studies: No results for input(s): IRON, TIBC, TRANSFERRIN, FERRITIN in the last 72 hours. Studies/Results: No results found.  vitamin C  500 mg Oral Daily   aspirin  325 mg Oral Daily   atorvastatin  40 mg Oral q1800   calcium acetate  1,334 mg Oral TID WC   carvedilol  6.25 mg Oral BID WC   Chlorhexidine Gluconate Cloth  6 each Topical Q0600   doxercalciferol  1 mcg Intravenous Q M,W,F-HD   FLUoxetine  20 mg Oral Daily   gabapentin  100 mg Oral QHS   heparin  5,000 Units Subcutaneous Q8H   insulin aspart  0-20 Units Subcutaneous TID WC   insulin aspart  0-5 Units Subcutaneous QHS   losartan  25 mg Oral QPM   multivitamin with minerals  1 tablet Oral Daily   zinc sulfate  220 mg Oral Daily  BMET    Component Value Date/Time   NA 134 (L) 04/14/2021 0338   NA 141 11/24/2020 1304   K 3.7 04/14/2021 0338   CL 99 04/14/2021 0338   CO2 21 (L) 04/14/2021 0338   GLUCOSE 98 04/14/2021 0338   BUN 79 (H) 04/14/2021 0338   BUN 47 (H) 11/24/2020 1304   CREATININE 5.33 (H) 04/14/2021 0338   CREATININE 1.25 (H) 11/13/2016 1018   CALCIUM 8.8 (L) 04/14/2021 0338   CALCIUM 5.8 (LL) 07/24/2020 0343   GFRNONAA 8 (L) 04/14/2021 0338   GFRNONAA 47 (L) 11/13/2016 1018   GFRAA 13 (L) 06/15/2020 1134   GFRAA 54 (L) 11/13/2016 1018   CBC    Component Value Date/Time   WBC 7.2 04/14/2021 0338   RBC 4.09 04/14/2021 0338   HGB 10.8 (L) 04/14/2021 0338   HGB 9.5 (L) 02/06/2021 0927   HCT 36.4 04/14/2021 0338   HCT 31.3 (L) 02/06/2021 0927   PLT 219 04/14/2021 0338   PLT 299 02/06/2021 0927   MCV 89.0 04/14/2021 0338   MCV 93 02/06/2021 0927   MCH 26.4 04/14/2021 0338   MCHC 29.7 (L) 04/14/2021 0338   RDW 17.4 (H) 04/14/2021 0338   RDW 15.8 (H) 02/06/2021 0927   LYMPHSABS 0.5 (L) 04/11/2021 0248   LYMPHSABS 1.6 02/06/2021 0927    MONOABS 0.3 04/11/2021 0248   EOSABS 0.0 04/11/2021 0248   EOSABS 0.2 02/06/2021 0927   BASOSABS 0.0 04/11/2021 0248   BASOSABS 0.0 02/06/2021 0927     Dialysis:  MWFS  [facility]  3h  400/500  77kg  3K/2.5Ca bath  LUA AVF  Hep 5000  Mircera 225 mcg q2wks - last 7/18 Venofer '100mg'$  qHD x10 (2 completed) Hectorol 48mg IV qHD   Assessment/Plan: Vascular access - she has not had issues during this hospitalization until 04/13/21.  Ablto to have full HD treatment 04/14/21.  Will schedule an outpatient fistulogram and ok for discharge from renal standpoint. COVID PNA- tested positive on 7/27. Per PMD-   actemra and steroids, pt refused remdesivir. Acute on chronic respiratory failure - 2/2 #1 & likely superimposed by bacterial PNA and vol excess/ pulm edema. On 4L O2 at home and currently able to wean down to 3 liters [place].  Able to UF below EDW and will continue to challenge with HD in hopes of improving oxygen requirements.  Currently on 4 liters via [place] which is her home dose.  ESRD -  on HD MWFSat for 3h.  HD again today and UF as above.    Hypertension/volume  - Blood pressure elevated.  Usually takes midodrine '10mg'$  pre HD > started '10mg'$  tid here. Continue home meds-  coreg 6.25, losartan 25-  doses decreased, others as needed.  Midodrine was stopped yesterday due to elevated BP's.  Will need lower EDW at discharge.  She is 73.4kg today (4 kg below edw)  Anemia of CKD -  Will hold iron in setting of acute infection.  ESA due 8/1-  hgb 10.8-  will hold for now  Secondary Hyperparathyroidism -  Ca in goal. Phos high, not unusual. Continue VDRA (hect) and phoslo 2 AC TID  Nutrition - Renal diet w/fluid restrictions Disposition - stable for discharge from renal standpoint.   JDonetta Potts MD [employer]([phone]

## 2021-04-16 ENCOUNTER — Telehealth: Payer: Self-pay

## 2021-04-16 ENCOUNTER — Other Ambulatory Visit: Payer: Self-pay

## 2021-04-16 ENCOUNTER — Telehealth: Payer: Self-pay | Admitting: Nurse Practitioner

## 2021-04-16 DIAGNOSIS — I5042 Chronic combined systolic (congestive) and diastolic (congestive) heart failure: Secondary | ICD-10-CM

## 2021-04-16 DIAGNOSIS — J069 Acute upper respiratory infection, unspecified: Secondary | ICD-10-CM

## 2021-04-16 DIAGNOSIS — I10 Essential (primary) hypertension: Secondary | ICD-10-CM

## 2021-04-16 DIAGNOSIS — Z8673 Personal history of transient ischemic attack (TIA), and cerebral infarction without residual deficits: Secondary | ICD-10-CM

## 2021-04-16 NOTE — Telephone Encounter (Signed)
Transition of care contact from inpatient facility  Date of discharge: 04/15/2021 Date of contact: 04/16/2021 Method: Phone Spoke to: Patient  Patient contacted to discuss transition of care from recent inpatient hospitalization. Patient was admitted to [facility] from 07/27-08/03/2021 with discharge diagnosis of COVID 19 PNA  Medication changes were reviewed.  Patient will follow up with his/her outpatient HD unit on: 04/16/2021.

## 2021-04-16 NOTE — Telephone Encounter (Signed)
Transition Care Management Follow-up Telephone Call Date of discharge and from where: 04/15/2021-[place]  How have you been since you were released from the hospital? Patient stated she is doing good.  Any questions or concerns? Yes  Items Reviewed: Did the pt receive and understand the discharge instructions provided? Yes  Medications obtained and verified?  No medications given at discharge  Other? No  Any new allergies since your discharge? No  Dietary orders reviewed? N/A Do you have support at home? Yes   Home Care and Equipment/Supplies: Were home health services ordered? not applicable If so, what is the name of the agency? N/A  Has the agency set up a time to come to the patient's home? not applicable Were any new equipment or medical supplies ordered?  No What is the name of the medical supply agency? N/A Were you able to get the supplies/equipment? not applicable Do you have any questions related to the use of the equipment or supplies? No  Functional Questionnaire: (I = Independent and D = Dependent) ADLs: I  Bathing/Dressing- I  Meal Prep- I  Eating- I  Maintaining continence- I  Transferring/Ambulation- I  Managing Meds- I  Follow up appointments reviewed:  PCP Hospital f/u appt confirmed? Yes  Scheduled to see Dr. Margarita Rana on 05/01/2021 @ 10:30 am. [facility] f/u appt confirmed? No   Are transportation arrangements needed? No  If their condition worsens, is the pt aware to call PCP or go to the Emergency Dept.? Yes Was the patient provided with contact information for the PCP's office or ED? Yes Was to pt encouraged to call back with questions or concerns? Yes

## 2021-04-16 NOTE — Patient Outreach (Signed)
[facility]) Care Management  04/16/2021  HEWAN STUHL 01-10-1956 [id number]  Managed Medicaid: Extreme high risk score for unplanned readmission risk  Referral for post hospital chronic care management  Natividad Brood, RN BSN [facility] Liaison  [phone] business mobile phone Toll free office [phone]  Fax number: [phone] [email or web address]

## 2021-05-01 ENCOUNTER — Other Ambulatory Visit: Payer: Self-pay

## 2021-05-01 ENCOUNTER — Ambulatory Visit: Payer: Medicaid Other | Admitting: Family Medicine

## 2021-05-01 ENCOUNTER — Encounter: Payer: Self-pay | Admitting: Family Medicine

## 2021-05-01 ENCOUNTER — Ambulatory Visit: Payer: Medicare Other | Attending: Family Medicine | Admitting: Family Medicine

## 2021-05-01 DIAGNOSIS — Z8616 Personal history of COVID-19: Secondary | ICD-10-CM | POA: Diagnosis not present

## 2021-05-01 DIAGNOSIS — R159 Full incontinence of feces: Secondary | ICD-10-CM | POA: Diagnosis not present

## 2021-05-01 DIAGNOSIS — E1122 Type 2 diabetes mellitus with diabetic chronic kidney disease: Secondary | ICD-10-CM | POA: Diagnosis not present

## 2021-05-01 DIAGNOSIS — N186 End stage renal disease: Secondary | ICD-10-CM

## 2021-05-01 DIAGNOSIS — I120 Hypertensive chronic kidney disease with stage 5 chronic kidney disease or end stage renal disease: Secondary | ICD-10-CM

## 2021-05-01 DIAGNOSIS — R197 Diarrhea, unspecified: Secondary | ICD-10-CM

## 2021-05-01 DIAGNOSIS — R152 Fecal urgency: Secondary | ICD-10-CM

## 2021-05-01 DIAGNOSIS — N185 Chronic kidney disease, stage 5: Secondary | ICD-10-CM

## 2021-05-01 DIAGNOSIS — Z992 Dependence on renal dialysis: Secondary | ICD-10-CM

## 2021-05-01 MED ORDER — LOSARTAN POTASSIUM 100 MG PO TABS
100.0000 mg | ORAL_TABLET | Freq: Every evening | ORAL | 6 refills | Status: DC
  Filled 2021-05-01: qty 30, 30d supply, fill #0
  Filled 2021-07-11: qty 30, 30d supply, fill #1
  Filled 2021-09-25: qty 30, 30d supply, fill #2
  Filled 2021-09-26: qty 30, 30d supply, fill #0

## 2021-05-01 MED ORDER — DICYCLOMINE HCL 10 MG PO CAPS
10.0000 mg | ORAL_CAPSULE | Freq: Two times a day (BID) | ORAL | 3 refills | Status: DC | PRN
  Filled 2021-05-01: qty 60, 30d supply, fill #0

## 2021-05-01 NOTE — Progress Notes (Signed)
Virtual Visit via Video Note  I connected with Tina Gomez, on 05/01/2021 at 10:32 AM by video enabled telemedicine device due to the COVID-19 pandemic and verified that I am speaking with the correct person using two identifiers.   Consent: I discussed the limitations, risks, security and privacy concerns of performing an evaluation and management service by telemedicine and the availability of in person appointments. I also discussed with the patient that there may be a patient responsible charge related to this service. The patient expressed understanding and agreed to proceed.   Location of Patient: Home  Location of Provider: Clinic   Persons participating in Telemedicine visit: Tina Gomez Dr. Margarita Rana Patient's daughter     History of Present Illness: Tina Gomez is a 65 y.o. year old female  with a history of hypertension, hyperlipidemia, type 2 diabetes mellitus (diet controlled with A1c 6.2), GERD, COPD, ESRD on HD (M,W,F at [facility]), hypertensive heart disease (EF 30% from echo 11/2020). She is seen for transitional care visit after hospitalization at [facility] from 04/04/2021 through 04/15/2021 for acute respiratory distress secondary to COVID-19 pneumonia  Treated with Steroids, Remdesivir, Actemra, high-flow oxygen and subsequently discharged on oxygen. She is on 3L Oxygen at rest. She has a cough which is productive of thick clear sputum but denies presence of chest pain or fever.  Her energy level is low.  Her nebulizer has been beneficial but she has not antitussive.  Currently undergoing home PT.  She takes Imodium on HD days due to diarrhea with commenced when she started hemodialysis.  Denies abdominal cramping. Daughter states that the patient experiences severely elevated blood pressure with systolics in the 767M on nondialysis days even with her antihypertensive.  On hemodialysis days she experiences hypotension and has to use midodrine as  needed. With regards to her diabetes she has not had any hypoglycemic episodes.  Past Medical History:  Diagnosis Date   Anemia    Chronic kidney disease    COPD (chronic obstructive pulmonary disease) ([place]) 05/2019   no inhaler   Diabetes mellitus without complication (HCC)    no meds - diet controlled   History of blood transfusion 09/2020   1 unit   HLD (hyperlipidemia)    Hypertension    PONV (postoperative nausea and vomiting)    Restless legs syndrome (RLS)    Stroke (HCC)    Allergies  Allergen Reactions   Codeine Nausea And Vomiting   Adhesive [Tape] Other (See Comments)    Tape breaks out the skin if it is left on for a lengthy period of time    Current Outpatient Medications on File Prior to Visit  Medication Sig Dispense Refill   acetaminophen (TYLENOL) 500 MG tablet Take 1,000 mg by mouth every 6 (six) hours as needed for mild pain or headache.     albuterol (PROVENTIL) (2.5 MG/3ML) 0.083% nebulizer solution Take 3 mLs (2.5 mg total) by nebulization every 6 (six) hours as needed for wheezing or shortness of breath. 75 mL 0   albuterol (VENTOLIN HFA) 108 (90 Base) MCG/ACT inhaler Inhale 2 puffs into the lungs every 6 (six) hours as needed for wheezing or shortness of breath. 8.5 g 1   aspirin 325 MG tablet Take 325 mg by mouth daily.     atorvastatin (LIPITOR) 40 MG tablet Take 1 tablet (40 mg total) by mouth daily at 6 PM.     B Complex-C-Zn-Folic Acid (DIALYVITE 094 WITH ZINC) 0.8 MG TABS  Take 1 tablet by mouth at bedtime.     Blood Glucose Monitoring Suppl (BLOOD GLUCOSE METER) kit Use as instructed 1 each 0   Blood Glucose Monitoring Suppl (TRUE METRIX METER) DEVI 1 each by Does not apply route 3 (three) times daily before meals. 1 each 0   calcium acetate (PHOSLO) 667 MG capsule Take 1,334 mg by mouth in the morning, at noon, in the evening, and at bedtime. With meals and a snack     carvedilol (COREG) 12.5 MG tablet Take 1 tablet (12.5 mg total) by mouth 2 (two)  times daily with a meal.     FLUoxetine (PROZAC) 20 MG capsule Take 1 capsule (20 mg total) by mouth daily. 30 capsule 3   gabapentin (NEURONTIN) 100 MG capsule Take 1 capsule (100 mg total) by mouth at bedtime.     glucose blood (TRUETEST TEST) test strip Use as instructed 100 each 12   hydrOXYzine (ATARAX/VISTARIL) 25 MG tablet Take 1 tablet (25 mg total) by mouth 3 (three) times daily as needed for anxiety.     Lancets (FREESTYLE) lancets Use as instructed 100 each 12   lidocaine-prilocaine (EMLA) cream Apply 1 application topically See admin instructions. For use on dialysis days.     losartan (COZAAR) 50 MG tablet Take 50 mg by mouth every evening.     midodrine (PROAMATINE) 10 MG tablet Take 1 tablet (10 mg total) by mouth See admin instructions. Monday ,Wednesday and Friday. As needed if blood pressure is low.     Misc. Devices MISC Large depends. Diagnosis - fecal incontinence 1 each 0   OXYGEN Inhale 3 L into the lungs continuous.     Tiotropium Bromide Monohydrate (SPIRIVA RESPIMAT) 2.5 MCG/ACT AERS Inhale 2 puffs into the lungs daily. 4 g 3   TRUEplus Lancets 28G MISC 1 each by Does not apply route 3 (three) times daily before meals. 100 each 12   [DISCONTINUED] amLODipine (NORVASC) 10 MG tablet Take 1 tablet (10 mg total) by mouth daily. 30 tablet 0   [DISCONTINUED] hydrALAZINE (APRESOLINE) 25 MG tablet Take 1 tablet (25 mg total) by mouth every 8 (eight) hours. 90 tablet 0   [DISCONTINUED] omeprazole (PRILOSEC) 20 MG capsule TAKE 1 CAPSULE (20 MG TOTAL) BY MOUTH 2 (TWO) TIMES DAILY BEFORE A MEAL. 28 capsule 0   No current facility-administered medications on file prior to visit.    ROS: See HPI  Observations/Objective: Awake, alert, ranted x3 ENT-currently on oxygen via nasal cannula Neck-no JVD Respiration-not in acute distress Musculoskeletal- FROM  CMP Latest Ref Rng & Units 04/14/2021 04/11/2021 04/10/2021  Glucose 70 - 99 mg/dL 98 199(H) 114(H)  BUN 8 - 23 mg/dL 79(H)  62(H) 33(H)  Creatinine 0.44 - 1.00 mg/dL 5.33(H) 5.02(H) 3.28(H)  Sodium 135 - 145 mmol/L 134(L) 136 138  Potassium 3.5 - 5.1 mmol/L 3.7 4.1 3.3(L)  Chloride 98 - 111 mmol/L 99 95(L) 101  CO2 22 - 32 mmol/L 21(L) 27 26  Calcium 8.9 - 10.3 mg/dL 8.8(L) 9.2 8.5(L)  Total Protein 6.5 - 8.1 g/dL - 6.4(L) 6.0(L)  Total Bilirubin 0.3 - 1.2 mg/dL - 0.6 0.3  Alkaline Phos 38 - 126 U/L - 59 59  AST 15 - 41 U/L - 13(L) 13(L)  ALT 0 - 44 U/L - 12 14    Lipid Panel     Component Value Date/Time   CHOL 251 (H) 11/25/2018 1506   TRIG 272 (H) 11/25/2018 1506   HDL 44 11/25/2018 1506  CHOLHDL 5.7 (H) 11/25/2018 1506   CHOLHDL 4.8 11/13/2016 1018   VLDL 40 (H) 02/15/2016 0907   LDLCALC 153 (H) 11/25/2018 1506   LABVLDL 54 (H) 11/25/2018 1506    Lab Results  Component Value Date   HGBA1C 6.2 (H) 04/06/2021     Assessment and Plan: 1. End stage renal disease on dialysis due to type 2 diabetes mellitus (HCC) A1c of 6.2 from 03/2021 Diabetes is diet controlled Continue hemodialysis as per schedule  2. Diarrhea, unspecified type With associated fecal incontinence Will place on Bentyl and advised to adjust dose according to symptoms - dicyclomine (BENTYL) 10 MG capsule; Take 1 capsule (10 mg total) by mouth 2 (two) times daily as needed for spasms.  Dispense: 60 capsule; Refill: 3  3. Hypertension in stage 5 chronic kidney disease due to type 2 diabetes mellitus ([place]) Accelerated hypertension on nondialysis days Will increase dose of Cozaar which she will administer on nondialysis days On hemodialysis days she will use midodrine due to hypotension - losartan (COZAAR) 100 MG tablet; Take 1 tablet (100 mg total) by mouth every evening.  Dispense: 30 tablet; Refill: 6  4. Incontinence of feces with fecal urgency See #2 above  5. History of COVID-19 With ongoing cough and lethargy She is experiencing deconditioning which I have explained to her Will order chest x-ray and based on  findings placed on steroids if indicated - DG Chest 2 View; Future   Follow Up Instructions: 3 months   I discussed the assessment and treatment plan with the patient. The patient was provided an opportunity to ask questions and all were answered. The patient agreed with the plan and demonstrated an understanding of the instructions.   The patient was advised to call back or seek an in-person evaluation if the symptoms worsen or if the condition fails to improve as anticipated.     I provided 22 minutes total of Telehealth time during this encounter including median intraservice time, reviewing previous notes, investigations, ordering medications, medical decision making, coordinating care and patient verbalized understanding at the end of the visit.     Charlott Rakes, MD, FAAFP. [facility] and [place] [facility], [place]   05/01/2021, 10:32 AM

## 2021-05-02 ENCOUNTER — Other Ambulatory Visit: Payer: Self-pay

## 2021-05-08 ENCOUNTER — Other Ambulatory Visit: Payer: Self-pay

## 2021-05-16 ENCOUNTER — Other Ambulatory Visit (HOSPITAL_COMMUNITY): Payer: Self-pay

## 2021-05-16 MED ORDER — MIDODRINE HCL 10 MG PO TABS
10.0000 mg | ORAL_TABLET | ORAL | 4 refills | Status: DC
  Filled 2021-05-16: qty 30, 30d supply, fill #0
  Filled 2021-07-11: qty 30, 30d supply, fill #1

## 2021-05-18 ENCOUNTER — Other Ambulatory Visit: Payer: Self-pay | Admitting: Family Medicine

## 2021-05-18 ENCOUNTER — Other Ambulatory Visit: Payer: Self-pay

## 2021-05-18 DIAGNOSIS — J439 Emphysema, unspecified: Secondary | ICD-10-CM

## 2021-05-18 MED ORDER — ALBUTEROL SULFATE HFA 108 (90 BASE) MCG/ACT IN AERS
2.0000 | INHALATION_SPRAY | Freq: Four times a day (QID) | RESPIRATORY_TRACT | 3 refills | Status: DC | PRN
  Filled 2021-05-18 – 2021-07-11 (×2): qty 8.5, 25d supply, fill #0

## 2021-05-21 ENCOUNTER — Other Ambulatory Visit: Payer: Self-pay

## 2021-05-28 ENCOUNTER — Other Ambulatory Visit: Payer: Self-pay

## 2021-06-18 ENCOUNTER — Other Ambulatory Visit: Payer: Self-pay | Admitting: Family Medicine

## 2021-06-18 ENCOUNTER — Encounter: Payer: Self-pay | Admitting: Family Medicine

## 2021-06-18 ENCOUNTER — Other Ambulatory Visit: Payer: Self-pay

## 2021-06-18 MED ORDER — ONDANSETRON HCL 4 MG PO TABS
4.0000 mg | ORAL_TABLET | Freq: Three times a day (TID) | ORAL | 1 refills | Status: DC | PRN
  Filled 2021-06-18: qty 30, 10d supply, fill #0

## 2021-06-19 ENCOUNTER — Ambulatory Visit: Payer: Medicare Other | Attending: Family Medicine | Admitting: Family Medicine

## 2021-06-19 ENCOUNTER — Other Ambulatory Visit: Payer: Self-pay

## 2021-06-19 ENCOUNTER — Encounter: Payer: Self-pay | Admitting: Family Medicine

## 2021-06-19 DIAGNOSIS — R197 Diarrhea, unspecified: Secondary | ICD-10-CM | POA: Diagnosis not present

## 2021-06-19 DIAGNOSIS — G4701 Insomnia due to medical condition: Secondary | ICD-10-CM

## 2021-06-19 DIAGNOSIS — I2510 Atherosclerotic heart disease of native coronary artery without angina pectoris: Secondary | ICD-10-CM

## 2021-06-19 DIAGNOSIS — E1122 Type 2 diabetes mellitus with diabetic chronic kidney disease: Secondary | ICD-10-CM

## 2021-06-19 DIAGNOSIS — R110 Nausea: Secondary | ICD-10-CM | POA: Diagnosis not present

## 2021-06-19 DIAGNOSIS — N186 End stage renal disease: Secondary | ICD-10-CM

## 2021-06-19 DIAGNOSIS — I120 Hypertensive chronic kidney disease with stage 5 chronic kidney disease or end stage renal disease: Secondary | ICD-10-CM

## 2021-06-19 DIAGNOSIS — Z992 Dependence on renal dialysis: Secondary | ICD-10-CM

## 2021-06-19 DIAGNOSIS — N185 Chronic kidney disease, stage 5: Secondary | ICD-10-CM

## 2021-06-19 MED ORDER — ONDANSETRON HCL 4 MG PO TABS
4.0000 mg | ORAL_TABLET | Freq: Three times a day (TID) | ORAL | 3 refills | Status: DC | PRN
  Filled 2021-06-19 – 2021-07-11 (×3): qty 60, 20d supply, fill #0
  Filled 2021-08-07: qty 60, 20d supply, fill #1
  Filled 2021-09-25: qty 60, 20d supply, fill #2
  Filled 2021-09-26: qty 60, 20d supply, fill #0

## 2021-06-19 NOTE — Progress Notes (Signed)
Virtual Visit via Video Note  I connected with Tina Gomez, on 06/19/2021 at 10:52 AM by video enabled telemedicine device due to the COVID-19 pandemic and verified that I am speaking with the correct person using two identifiers.   Consent: I discussed the limitations, risks, security and privacy concerns of performing an evaluation and management service by telemedicine and the availability of in person appointments. I also discussed with the patient that there may be a patient responsible charge related to this service. The patient expressed understanding and agreed to proceed.   Location of Patient: Home  Location of Provider: Clinic   Persons participating in Telemedicine visit: [name] - daughter Dr. Margarita Rana     History of Present Illness: Tina Gomez is a 65 y.o. year old female  with  a history of hypertension, hyperlipidemia, type 2 diabetes mellitus (diet controlled with A1c 6.2), GERD, COPD, ESRD on HD (M,W,F at [facility]), hypertensive heart disease (EF 30% from echo 11/2020). She is seen for transitional care visit after hospitalization at [facility] from 04/04/2021 through 04/15/2021 for acute respiratory distress secondary to COVID-19 pneumonia    She has nausea and had requested a prescription for Zofran which had sent to her pharmacy but she is yet to pick up.  Previously was unable to take Reglan.  She does notice that when she takes Zofran at night it also helps with her insomnia. At her last visit I had placed her on Bentyl due to her persistent diarrhea and she has noticed improvement in her symptoms. Her blood pressure continues to fluctuate between hypotension requiring administration of midodrine and severely elevated blood pressures requiring her antihypertensives.  Hypotension occurs primarily after hemodialysis sessions.  This visit was scheduled due to the patient having received a palliative care consult  during hospitalization but her daughter was not present and wanted some clarification regarding the process.  The patient informs me she was assisted in signing advanced directives paperwork and handing over the care of her special needs son to her daughter [name].  She informs me she does not feel she needs hospice as she is stable and is able to perform some of her ADLs but will need assistance in the home. Past Medical History:  Diagnosis Date   Anemia    Chronic kidney disease    COPD (chronic obstructive pulmonary disease) ([place]) 05/2019   no inhaler   Diabetes mellitus without complication (HCC)    no meds - diet controlled   History of blood transfusion 09/2020   1 unit   HLD (hyperlipidemia)    Hypertension    PONV (postoperative nausea and vomiting)    Restless legs syndrome (RLS)    Stroke (HCC)    Allergies  Allergen Reactions   Codeine Nausea And Vomiting   Adhesive [Tape] Other (See Comments)    Tape breaks out the skin if it is left on for a lengthy period of time    Current Outpatient Medications on File Prior to Visit  Medication Sig Dispense Refill   acetaminophen (TYLENOL) 500 MG tablet Take 1,000 mg by mouth every 6 (six) hours as needed for mild pain or headache.     albuterol (PROAIR HFA) 108 (90 Base) MCG/ACT inhaler Inhale 2 puffs into the lungs every 6 (six) hours as needed for wheezing or shortness of breath. 8.5 g 3   albuterol (PROVENTIL) (2.5 MG/3ML) 0.083% nebulizer solution Take 3 mLs (2.5 mg total) by nebulization  every 6 (six) hours as needed for wheezing or shortness of breath. 75 mL 0   aspirin 325 MG tablet Take 325 mg by mouth daily.     atorvastatin (LIPITOR) 40 MG tablet Take 1 tablet (40 mg total) by mouth daily at 6 PM.     B Complex-C-Zn-Folic Acid (DIALYVITE 740 WITH ZINC) 0.8 MG TABS Take 1 tablet by mouth at bedtime.     Blood Glucose Monitoring Suppl (BLOOD GLUCOSE METER) kit Use as instructed 1 each 0   Blood Glucose Monitoring Suppl (TRUE  METRIX METER) DEVI 1 each by Does not apply route 3 (three) times daily before meals. 1 each 0   calcium acetate (PHOSLO) 667 MG capsule Take 1,334 mg by mouth in the morning, at noon, in the evening, and at bedtime. With meals and a snack     carvedilol (COREG) 12.5 MG tablet Take 1 tablet (12.5 mg total) by mouth 2 (two) times daily with a meal.     dicyclomine (BENTYL) 10 MG capsule Take 1 capsule (10 mg total) by mouth 2 (two) times daily as needed for spasms. 60 capsule 3   FLUoxetine (PROZAC) 20 MG capsule Take 1 capsule (20 mg total) by mouth daily. 30 capsule 3   gabapentin (NEURONTIN) 100 MG capsule Take 1 capsule (100 mg total) by mouth at bedtime.     glucose blood (TRUETEST TEST) test strip Use as instructed 100 each 12   hydrOXYzine (ATARAX/VISTARIL) 25 MG tablet Take 1 tablet (25 mg total) by mouth 3 (three) times daily as needed for anxiety.     Lancets (FREESTYLE) lancets Use as instructed 100 each 12   lidocaine-prilocaine (EMLA) cream Apply 1 application topically See admin instructions. For use on dialysis days.     losartan (COZAAR) 100 MG tablet Take 1 tablet (100 mg total) by mouth every evening. 30 tablet 6   midodrine (PROAMATINE) 10 MG tablet Take 1 tablet (10 mg total) by mouth See admin instructions. Monday ,Wednesday and Friday. As needed if blood pressure is low.     midodrine (PROAMATINE) 10 MG tablet Take 1 tablet (10 mg total) by mouth as directed prior to dialysis. 30 tablet 4   Misc. Devices MISC Large depends. Diagnosis - fecal incontinence 1 each 0   ondansetron (ZOFRAN) 4 MG tablet Take 1 tablet (4 mg total) by mouth every 8 (eight) hours as needed for nausea or vomiting. 30 tablet 1   OXYGEN Inhale 3 L into the lungs continuous.     Tiotropium Bromide Monohydrate (SPIRIVA RESPIMAT) 2.5 MCG/ACT AERS Inhale 2 puffs into the lungs daily. 4 g 3   TRUEplus Lancets 28G MISC 1 each by Does not apply route 3 (three) times daily before meals. 100 each 12    [DISCONTINUED] amLODipine (NORVASC) 10 MG tablet Take 1 tablet (10 mg total) by mouth daily. 30 tablet 0   [DISCONTINUED] hydrALAZINE (APRESOLINE) 25 MG tablet Take 1 tablet (25 mg total) by mouth every 8 (eight) hours. 90 tablet 0   [DISCONTINUED] omeprazole (PRILOSEC) 20 MG capsule TAKE 1 CAPSULE (20 MG TOTAL) BY MOUTH 2 (TWO) TIMES DAILY BEFORE A MEAL. 28 capsule 0   No current facility-administered medications on file prior to visit.    ROS: See HPI  Observations/Objective: Awake, alert, ranted x3 Not in acute distress Normal mood Full range of motion of extremities  CMP Latest Ref Rng & Units 04/14/2021 04/11/2021 04/10/2021  Glucose 70 - 99 mg/dL 98 199(H) 114(H)  BUN 8 -  23 mg/dL 79(H) 62(H) 33(H)  Creatinine 0.44 - 1.00 mg/dL 5.33(H) 5.02(H) 3.28(H)  Sodium 135 - 145 mmol/L 134(L) 136 138  Potassium 3.5 - 5.1 mmol/L 3.7 4.1 3.3(L)  Chloride 98 - 111 mmol/L 99 95(L) 101  CO2 22 - 32 mmol/L 21(L) 27 26  Calcium 8.9 - 10.3 mg/dL 8.8(L) 9.2 8.5(L)  Total Protein 6.5 - 8.1 g/dL - 6.4(L) 6.0(L)  Total Bilirubin 0.3 - 1.2 mg/dL - 0.6 0.3  Alkaline Phos 38 - 126 U/L - 59 59  AST 15 - 41 U/L - 13(L) 13(L)  ALT 0 - 44 U/L - 12 14    Lipid Panel     Component Value Date/Time   CHOL 251 (H) 11/25/2018 1506   TRIG 272 (H) 11/25/2018 1506   HDL 44 11/25/2018 1506   CHOLHDL 5.7 (H) 11/25/2018 1506   CHOLHDL 4.8 11/13/2016 1018   VLDL 40 (H) 02/15/2016 0907   LDLCALC 153 (H) 11/25/2018 1506   LABVLDL 54 (H) 11/25/2018 1506    Lab Results  Component Value Date   HGBA1C 6.2 (H) 04/06/2021     Assessment and Plan: 1. End stage renal disease on dialysis due to type 2 diabetes mellitus ([place]) Currently on hemodialysis as per schedule She is currently experiencing some symptoms of nausea as well She does have some deconditioning but this is her baseline for now I will proceed with placing orders for [facility] services for assistance with her ADLs and should her condition change her  daughter [name] will reach out to me. She does have her advanced directives in place  2. Nausea Uncontrolled Due to the fact that she needs this chronically I have increased prescribed quantity - ondansetron (ZOFRAN) 4 MG tablet; Take 1 tablet (4 mg total) by mouth every 8 (eight) hours as needed for nausea or vomiting.  Dispense: 60 tablet; Refill: 3  3. Insomnia due to medical condition Due to medical condition Zofran which she takes for nausea has been beneficial  4. Diarrhea, unspecified type Improved on Bentyl  5. Hypertension in stage 5 chronic kidney disease due to type 2 diabetes mellitus (HCC) Blood pressures continue to fluctuate Continue midodrine on dialysis days on antihypertensives and other days   Follow Up Instructions: Keep previously scheduled appointment   I discussed the assessment and treatment plan with the patient. The patient was provided an opportunity to ask questions and all were answered. The patient agreed with the plan and demonstrated an understanding of the instructions.   The patient was advised to call back or seek an in-person evaluation if the symptoms worsen or if the condition fails to improve as anticipated.     I provided 17 minutes total of Telehealth time during this encounter including median intraservice time, reviewing previous notes, investigations, ordering medications, medical decision making, coordinating care and patient verbalized understanding at the end of the visit.     Charlott Rakes, MD, FAAFP. [facility] and [facility], [place]   06/19/2021, 10:52 AM

## 2021-06-19 NOTE — Progress Notes (Signed)
Daughter want to discuss recent care.

## 2021-06-21 ENCOUNTER — Other Ambulatory Visit: Payer: Self-pay

## 2021-06-25 ENCOUNTER — Other Ambulatory Visit: Payer: Self-pay

## 2021-06-28 ENCOUNTER — Other Ambulatory Visit: Payer: Self-pay

## 2021-07-11 ENCOUNTER — Other Ambulatory Visit: Payer: Self-pay | Admitting: Pharmacist

## 2021-07-11 ENCOUNTER — Other Ambulatory Visit: Payer: Self-pay

## 2021-07-11 ENCOUNTER — Other Ambulatory Visit: Payer: Self-pay | Admitting: Family Medicine

## 2021-07-11 DIAGNOSIS — J439 Emphysema, unspecified: Secondary | ICD-10-CM

## 2021-07-11 DIAGNOSIS — F419 Anxiety disorder, unspecified: Secondary | ICD-10-CM

## 2021-07-11 MED ORDER — FLUOXETINE HCL 20 MG PO CAPS
20.0000 mg | ORAL_CAPSULE | Freq: Every day | ORAL | 2 refills | Status: DC
  Filled 2021-07-11: qty 30, 30d supply, fill #0
  Filled 2021-08-07: qty 30, 30d supply, fill #1
  Filled 2021-09-25: qty 30, 30d supply, fill #2
  Filled 2021-09-26: qty 30, 30d supply, fill #0

## 2021-07-11 MED ORDER — ALBUTEROL SULFATE HFA 108 (90 BASE) MCG/ACT IN AERS
2.0000 | INHALATION_SPRAY | Freq: Four times a day (QID) | RESPIRATORY_TRACT | 2 refills | Status: DC | PRN
  Filled 2021-07-11: qty 18, 25d supply, fill #0
  Filled 2021-08-07: qty 18, 25d supply, fill #1
  Filled 2021-09-25: qty 18, 25d supply, fill #2
  Filled 2021-09-26: qty 18, 25d supply, fill #0

## 2021-07-11 NOTE — Telephone Encounter (Signed)
Requested Prescriptions  Pending Prescriptions Disp Refills  . FLUoxetine (PROZAC) 20 MG capsule 30 capsule 3    Sig: Take 1 capsule (20 mg total) by mouth daily.     Psychiatry:  Antidepressants - SSRI Passed - 07/11/2021  1:57 PM      Passed - Valid encounter within last 6 months    Recent Outpatient Visits          3 weeks ago End stage renal disease on dialysis due to type 2 diabetes mellitus ([place])   [place], Charlane Ferretti, MD   2 months ago Diarrhea, unspecified type   [place], Charlane Ferretti, MD   5 months ago Hypertensive heart disease with acute systolic congestive heart failure ([place])   [place], Charlane Ferretti, MD   7 months ago Pulmonary emphysema, unspecified emphysema type ([place])   [place], Charlane Ferretti, MD   10 months ago Hypertension in stage 5 chronic kidney disease due to type 2 diabetes mellitus ([place])   [place], [place], MD             . albuterol (PROVENTIL) (2.5 MG/3ML) 0.083% nebulizer solution 75 mL 0    Sig: Take 3 mLs (2.5 mg total) by nebulization every 6 (six) hours as needed for wheezing or shortness of breath.     Pulmonology:  Beta Agonists Failed - 07/11/2021  1:57 PM      Failed - One inhaler should last at least one month. If the patient is requesting refills earlier, contact the patient to check for uncontrolled symptoms.      Passed - Valid encounter within last 12 months    Recent Outpatient Visits          3 weeks ago End stage renal disease on dialysis due to type 2 diabetes mellitus ([place])   [place], Charlane Ferretti, MD   2 months ago Diarrhea, unspecified type   [place], Charlane Ferretti, MD   5 months ago Hypertensive heart disease with acute systolic congestive heart failure [facility])   [place], Charlane Ferretti, MD   7 months ago Pulmonary emphysema, unspecified emphysema type [facility])   [place], Charlane Ferretti, MD   10 months ago Hypertension in stage 5 chronic kidney disease due to type 2 diabetes mellitus [facility])   [facility] Charlott Rakes, MD

## 2021-07-12 ENCOUNTER — Other Ambulatory Visit: Payer: Self-pay

## 2021-07-12 MED ORDER — ALBUTEROL SULFATE (2.5 MG/3ML) 0.083% IN NEBU
2.5000 mg | INHALATION_SOLUTION | Freq: Four times a day (QID) | RESPIRATORY_TRACT | 0 refills | Status: DC | PRN
  Filled 2021-07-12 – 2021-09-26 (×4): qty 75, 7d supply, fill #0

## 2021-07-12 NOTE — Telephone Encounter (Signed)
Requested Prescriptions  Pending Prescriptions Disp Refills  . albuterol (PROVENTIL) (2.5 MG/3ML) 0.083% nebulizer solution 75 mL 0    Sig: Take 3 mLs (2.5 mg total) by nebulization every 6 (six) hours as needed for wheezing or shortness of breath.     Pulmonology:  Beta Agonists Failed - 07/11/2021  1:57 PM      Failed - One inhaler should last at least one month. If the patient is requesting refills earlier, contact the patient to check for uncontrolled symptoms.      Passed - Valid encounter within last 12 months    Recent Outpatient Visits          3 weeks ago End stage renal disease on dialysis due to type 2 diabetes mellitus ([place])   [place], [place], MD   2 months ago Diarrhea, unspecified type   [place], Charlane Ferretti, MD   5 months ago Hypertensive heart disease with acute systolic congestive heart failure [facility])   [place], Charlane Ferretti, MD   7 months ago Pulmonary emphysema, unspecified emphysema type [facility])   [place], Charlane Ferretti, MD   10 months ago Hypertension in stage 5 chronic kidney disease due to type 2 diabetes mellitus ([place])   [place], Charlane Ferretti, MD             Signed Prescriptions Disp Refills   FLUoxetine (PROZAC) 20 MG capsule 30 capsule 2    Sig: Take 1 capsule (20 mg total) by mouth daily.     Psychiatry:  Antidepressants - SSRI Passed - 07/11/2021  1:57 PM      Passed - Valid encounter within last 6 months    Recent Outpatient Visits          3 weeks ago End stage renal disease on dialysis due to type 2 diabetes mellitus ([place])   [place], Charlane Ferretti, MD   2 months ago Diarrhea, unspecified type   [place], Charlane Ferretti, MD   5 months ago Hypertensive heart disease with acute systolic congestive heart  failure [facility])   [place], Charlane Ferretti, MD   7 months ago Pulmonary emphysema, unspecified emphysema type [facility])   [place], Charlane Ferretti, MD   10 months ago Hypertension in stage 5 chronic kidney disease due to type 2 diabetes mellitus [facility])   [place] [facility] Charlott Rakes, MD

## 2021-08-03 ENCOUNTER — Other Ambulatory Visit: Payer: Self-pay

## 2021-08-03 ENCOUNTER — Emergency Department (HOSPITAL_COMMUNITY): Payer: Medicare Other

## 2021-08-03 ENCOUNTER — Observation Stay (HOSPITAL_COMMUNITY)
Admission: EM | Admit: 2021-08-03 | Discharge: 2021-08-04 | Disposition: A | Discharge status: Home / Self Care | Payer: Medicare Other | Attending: Internal Medicine | Admitting: Internal Medicine

## 2021-08-03 ENCOUNTER — Encounter (HOSPITAL_COMMUNITY): Payer: Self-pay

## 2021-08-03 DIAGNOSIS — G9340 Encephalopathy, unspecified: Secondary | ICD-10-CM | POA: Diagnosis present

## 2021-08-03 DIAGNOSIS — I5022 Chronic systolic (congestive) heart failure: Secondary | ICD-10-CM | POA: Diagnosis present

## 2021-08-03 DIAGNOSIS — I2510 Atherosclerotic heart disease of native coronary artery without angina pectoris: Secondary | ICD-10-CM | POA: Diagnosis not present

## 2021-08-03 DIAGNOSIS — N186 End stage renal disease: Secondary | ICD-10-CM

## 2021-08-03 DIAGNOSIS — F419 Anxiety disorder, unspecified: Secondary | ICD-10-CM | POA: Diagnosis present

## 2021-08-03 DIAGNOSIS — I132 Hypertensive heart and chronic kidney disease with heart failure and with stage 5 chronic kidney disease, or end stage renal disease: Secondary | ICD-10-CM | POA: Insufficient documentation

## 2021-08-03 DIAGNOSIS — N19 Unspecified kidney failure: Secondary | ICD-10-CM

## 2021-08-03 DIAGNOSIS — E1122 Type 2 diabetes mellitus with diabetic chronic kidney disease: Secondary | ICD-10-CM | POA: Insufficient documentation

## 2021-08-03 DIAGNOSIS — J9621 Acute and chronic respiratory failure with hypoxia: Principal | ICD-10-CM | POA: Insufficient documentation

## 2021-08-03 DIAGNOSIS — J90 Pleural effusion, not elsewhere classified: Secondary | ICD-10-CM

## 2021-08-03 DIAGNOSIS — J449 Chronic obstructive pulmonary disease, unspecified: Secondary | ICD-10-CM | POA: Insufficient documentation

## 2021-08-03 DIAGNOSIS — Z7982 Long term (current) use of aspirin: Secondary | ICD-10-CM | POA: Insufficient documentation

## 2021-08-03 DIAGNOSIS — E8729 Other acidosis: Secondary | ICD-10-CM | POA: Diagnosis present

## 2021-08-03 DIAGNOSIS — I509 Heart failure, unspecified: Secondary | ICD-10-CM | POA: Insufficient documentation

## 2021-08-03 DIAGNOSIS — F32A Depression, unspecified: Secondary | ICD-10-CM | POA: Diagnosis present

## 2021-08-03 DIAGNOSIS — J9601 Acute respiratory failure with hypoxia: Secondary | ICD-10-CM

## 2021-08-03 DIAGNOSIS — E785 Hyperlipidemia, unspecified: Secondary | ICD-10-CM | POA: Diagnosis present

## 2021-08-03 DIAGNOSIS — J9611 Chronic respiratory failure with hypoxia: Secondary | ICD-10-CM | POA: Diagnosis present

## 2021-08-03 DIAGNOSIS — J9610 Chronic respiratory failure, unspecified whether with hypoxia or hypercapnia: Secondary | ICD-10-CM | POA: Diagnosis present

## 2021-08-03 DIAGNOSIS — R531 Weakness: Secondary | ICD-10-CM | POA: Diagnosis present

## 2021-08-03 DIAGNOSIS — K219 Gastro-esophageal reflux disease without esophagitis: Secondary | ICD-10-CM | POA: Diagnosis present

## 2021-08-03 DIAGNOSIS — D649 Anemia, unspecified: Secondary | ICD-10-CM | POA: Diagnosis present

## 2021-08-03 DIAGNOSIS — F17210 Nicotine dependence, cigarettes, uncomplicated: Secondary | ICD-10-CM | POA: Diagnosis not present

## 2021-08-03 DIAGNOSIS — Z79899 Other long term (current) drug therapy: Secondary | ICD-10-CM | POA: Insufficient documentation

## 2021-08-03 DIAGNOSIS — I10 Essential (primary) hypertension: Secondary | ICD-10-CM | POA: Diagnosis present

## 2021-08-03 DIAGNOSIS — I6789 Other cerebrovascular disease: Secondary | ICD-10-CM

## 2021-08-03 DIAGNOSIS — Z20822 Contact with and (suspected) exposure to covid-19: Secondary | ICD-10-CM | POA: Diagnosis not present

## 2021-08-03 DIAGNOSIS — Z8673 Personal history of transient ischemic attack (TIA), and cerebral infarction without residual deficits: Secondary | ICD-10-CM

## 2021-08-03 DIAGNOSIS — E1140 Type 2 diabetes mellitus with diabetic neuropathy, unspecified: Secondary | ICD-10-CM | POA: Diagnosis present

## 2021-08-03 LAB — DIFFERENTIAL
Abs Immature Granulocytes: 0.13 10*3/uL — ABNORMAL HIGH (ref 0.00–0.07)
Basophils Absolute: 0 10*3/uL (ref 0.0–0.1)
Basophils Relative: 0 %
Eosinophils Absolute: 0.3 10*3/uL (ref 0.0–0.5)
Eosinophils Relative: 3 %
Immature Granulocytes: 1 %
Lymphocytes Relative: 17 %
Lymphs Abs: 1.8 10*3/uL (ref 0.7–4.0)
Monocytes Absolute: 1 10*3/uL (ref 0.1–1.0)
Monocytes Relative: 9 %
Neutro Abs: 7.2 10*3/uL (ref 1.7–7.7)
Neutrophils Relative %: 70 %

## 2021-08-03 LAB — CBC
HCT: 37.8 % (ref 36.0–46.0)
Hemoglobin: 11.2 g/dL — ABNORMAL LOW (ref 12.0–15.0)
MCH: 27.3 pg (ref 26.0–34.0)
MCHC: 29.6 g/dL — ABNORMAL LOW (ref 30.0–36.0)
MCV: 92.2 fL (ref 80.0–100.0)
Platelets: 287 10*3/uL (ref 150–400)
RBC: 4.1 MIL/uL (ref 3.87–5.11)
RDW: 17.6 % — ABNORMAL HIGH (ref 11.5–15.5)
WBC: 10.4 10*3/uL (ref 4.0–10.5)
nRBC: 0 % (ref 0.0–0.2)

## 2021-08-03 LAB — COMPREHENSIVE METABOLIC PANEL
ALT: 14 U/L (ref 0–44)
AST: 29 U/L (ref 15–41)
Albumin: 2.7 g/dL — ABNORMAL LOW (ref 3.5–5.0)
Alkaline Phosphatase: 63 U/L (ref 38–126)
Anion gap: 20 — ABNORMAL HIGH (ref 5–15)
BUN: 95 mg/dL — ABNORMAL HIGH (ref 8–23)
CO2: 19 mmol/L — ABNORMAL LOW (ref 22–32)
Calcium: 8.2 mg/dL — ABNORMAL LOW (ref 8.9–10.3)
Chloride: 97 mmol/L — ABNORMAL LOW (ref 98–111)
Creatinine, Ser: 9.3 mg/dL — ABNORMAL HIGH (ref 0.44–1.00)
GFR, Estimated: 4 mL/min — ABNORMAL LOW (ref >60)
Glucose, Bld: 150 mg/dL — ABNORMAL HIGH (ref 70–99)
Potassium: 4.2 mmol/L (ref 3.5–5.1)
Sodium: 136 mmol/L (ref 135–145)
Total Bilirubin: 0.7 mg/dL (ref 0.3–1.2)
Total Protein: 6.5 g/dL (ref 6.5–8.1)

## 2021-08-03 LAB — I-STAT VENOUS BLOOD GAS, ED
Acid-base deficit: 1 mmol/L (ref 0.0–2.0)
Bicarbonate: 24.2 mmol/L (ref 20.0–28.0)
Calcium, Ion: 1.02 mmol/L — ABNORMAL LOW (ref 1.15–1.40)
HCT: 34 % — ABNORMAL LOW (ref 36.0–46.0)
Hemoglobin: 11.6 g/dL — ABNORMAL LOW (ref 12.0–15.0)
O2 Saturation: 92 %
Potassium: 3.5 mmol/L (ref 3.5–5.1)
Sodium: 137 mmol/L (ref 135–145)
TCO2: 25 mmol/L (ref 22–32)
pCO2, Ven: 42.7 mmHg — ABNORMAL LOW (ref 44.0–60.0)
pH, Ven: 7.361 (ref 7.250–7.430)
pO2, Ven: 67 mmHg — ABNORMAL HIGH (ref 32.0–45.0)

## 2021-08-03 LAB — BRAIN NATRIURETIC PEPTIDE: B Natriuretic Peptide: 1684.3 pg/mL — ABNORMAL HIGH (ref 0.0–100.0)

## 2021-08-03 LAB — RESP PANEL BY RT-PCR (FLU A&B, COVID) ARPGX2
Influenza A by PCR: NEGATIVE
Influenza B by PCR: NEGATIVE
SARS Coronavirus 2 by RT PCR: NEGATIVE

## 2021-08-03 LAB — HIV ANTIBODY (ROUTINE TESTING W REFLEX): HIV Screen 4th Generation wRfx: NONREACTIVE

## 2021-08-03 LAB — GLUCOSE, CAPILLARY: Glucose-Capillary: 179 mg/dL — ABNORMAL HIGH (ref 70–99)

## 2021-08-03 LAB — PROTIME-INR
INR: 1 (ref 0.8–1.2)
Prothrombin Time: 13.4 seconds (ref 11.4–15.2)

## 2021-08-03 LAB — APTT: aPTT: 27 seconds (ref 24–36)

## 2021-08-03 LAB — HEPATITIS B SURFACE ANTIBODY,QUALITATIVE: Hep B S Ab: REACTIVE — AB

## 2021-08-03 MED ORDER — TIOTROPIUM BROMIDE MONOHYDRATE 2.5 MCG/ACT IN AERS: 2.0000 | INHALATION_SPRAY | Freq: Every day | RESPIRATORY_TRACT | Status: DC

## 2021-08-03 MED ORDER — LIDOCAINE-PRILOCAINE 2.5-2.5 % EX CREA: 1.0000 "application " | TOPICAL_CREAM | CUTANEOUS | Status: DC

## 2021-08-03 MED ORDER — FLUOXETINE HCL 20 MG PO CAPS
20.0000 mg | ORAL_CAPSULE | Freq: Every day | ORAL | Status: DC
  Administered 2021-08-03: 20 mg via ORAL
  Filled 2021-08-03: qty 1

## 2021-08-03 MED ORDER — CALCIUM ACETATE (PHOS BINDER) 667 MG PO CAPS: 667.0000 mg | ORAL_CAPSULE | ORAL | Status: DC

## 2021-08-03 MED ORDER — LABETALOL HCL 5 MG/ML IV SOLN: 10.0000 mg | INTRAVENOUS | Status: DC | PRN

## 2021-08-03 MED ORDER — HEPARIN SODIUM (PORCINE) 5000 UNIT/ML IJ SOLN
5000.0000 [IU] | Freq: Three times a day (TID) | INTRAMUSCULAR | Status: DC
  Administered 2021-08-03 – 2021-08-04 (×2): 5000 [IU] via SUBCUTANEOUS
  Filled 2021-08-03 (×2): qty 1

## 2021-08-03 MED ORDER — SODIUM CHLORIDE 0.9 % IV SOLN: 100.0000 mL | INTRAVENOUS | Status: DC | PRN

## 2021-08-03 MED ORDER — ALBUTEROL SULFATE (2.5 MG/3ML) 0.083% IN NEBU: 2.5000 mg | INHALATION_SOLUTION | Freq: Four times a day (QID) | RESPIRATORY_TRACT | Status: DC | PRN

## 2021-08-03 MED ORDER — LIDOCAINE HCL (PF) 1 % IJ SOLN: 5.0000 mL | INTRAMUSCULAR | Status: DC | PRN

## 2021-08-03 MED ORDER — CALCIUM ACETATE (PHOS BINDER) 667 MG PO CAPS: 667.0000 mg | ORAL_CAPSULE | ORAL | Status: DC | PRN

## 2021-08-03 MED ORDER — ACETAMINOPHEN 650 MG RE SUPP: 650.0000 mg | Freq: Four times a day (QID) | RECTAL | Status: DC | PRN

## 2021-08-03 MED ORDER — ASPIRIN 325 MG PO TABS
325.0000 mg | ORAL_TABLET | Freq: Every evening | ORAL | Status: DC
  Administered 2021-08-03: 325 mg via ORAL

## 2021-08-03 MED ORDER — UMECLIDINIUM BROMIDE 62.5 MCG/ACT IN AEPB
1.0000 | INHALATION_SPRAY | Freq: Every day | RESPIRATORY_TRACT | Status: DC
  Filled 2021-08-03: qty 7

## 2021-08-03 MED ORDER — ONDANSETRON HCL 4 MG/2ML IJ SOLN: 4.0000 mg | Freq: Four times a day (QID) | INTRAMUSCULAR | Status: DC | PRN

## 2021-08-03 MED ORDER — HEPARIN SODIUM (PORCINE) 1000 UNIT/ML DIALYSIS: 5000.0000 [IU] | Freq: Once | INTRAMUSCULAR | Status: DC

## 2021-08-03 MED ORDER — INSULIN ASPART 100 UNIT/ML IJ SOLN: 0.0000 [IU] | Freq: Three times a day (TID) | INTRAMUSCULAR | Status: DC

## 2021-08-03 MED ORDER — ATORVASTATIN CALCIUM 40 MG PO TABS
40.0000 mg | ORAL_TABLET | Freq: Every day | ORAL | Status: DC
  Administered 2021-08-03: 40 mg via ORAL
  Filled 2021-08-03: qty 1

## 2021-08-03 MED ORDER — LIDOCAINE-PRILOCAINE 2.5-2.5 % EX CREA
1.0000 "application " | TOPICAL_CREAM | CUTANEOUS | Status: DC | PRN
  Filled 2021-08-03: qty 5

## 2021-08-03 MED ORDER — CALCIUM ACETATE (PHOS BINDER) 667 MG PO CAPS
2001.0000 mg | ORAL_CAPSULE | Freq: Three times a day (TID) | ORAL | Status: DC
  Administered 2021-08-04: 2001 mg via ORAL
  Filled 2021-08-03: qty 3

## 2021-08-03 MED ORDER — ASPIRIN 300 MG RE SUPP: 300.0000 mg | Freq: Every evening | RECTAL | Status: DC

## 2021-08-03 MED ORDER — CARVEDILOL 12.5 MG PO TABS
12.5000 mg | ORAL_TABLET | Freq: Two times a day (BID) | ORAL | Status: DC
  Filled 2021-08-03: qty 1

## 2021-08-03 MED ORDER — MIDODRINE HCL 5 MG PO TABS
10.0000 mg | ORAL_TABLET | ORAL | Status: DC
Start: 2021-08-03 — End: 2021-08-03

## 2021-08-03 MED ORDER — ASPIRIN 325 MG PO TABS
325.0000 mg | ORAL_TABLET | Freq: Every evening | ORAL | Status: DC
  Filled 2021-08-03: qty 1

## 2021-08-03 MED ORDER — SODIUM CHLORIDE 0.9 % IV SOLN: 250.0000 mL | INTRAVENOUS | Status: DC | PRN

## 2021-08-03 MED ORDER — PENTAFLUOROPROP-TETRAFLUOROETH EX AERO
1.0000 "application " | INHALATION_SPRAY | CUTANEOUS | Status: DC | PRN
  Filled 2021-08-03: qty 116

## 2021-08-03 MED ORDER — ASPIRIN 300 MG RE SUPP: 300.0000 mg | Freq: Every day | RECTAL | Status: DC

## 2021-08-03 MED ORDER — ONDANSETRON HCL 4 MG PO TABS: 4.0000 mg | ORAL_TABLET | Freq: Four times a day (QID) | ORAL | Status: DC | PRN

## 2021-08-03 MED ORDER — DOXERCALCIFEROL 4 MCG/2ML IV SOLN
1.0000 ug | INTRAVENOUS | Status: DC
  Filled 2021-08-03: qty 2

## 2021-08-03 MED ORDER — LOSARTAN POTASSIUM 50 MG PO TABS
100.0000 mg | ORAL_TABLET | Freq: Every day | ORAL | Status: DC
  Administered 2021-08-03: 100 mg via ORAL
  Filled 2021-08-03: qty 2

## 2021-08-03 MED ORDER — GABAPENTIN 100 MG PO CAPS
100.0000 mg | ORAL_CAPSULE | Freq: Every day | ORAL | Status: DC
  Administered 2021-08-03: 100 mg via ORAL
  Filled 2021-08-03: qty 1

## 2021-08-03 MED ORDER — MIDODRINE HCL 5 MG PO TABS: 10.0000 mg | ORAL_TABLET | ORAL | Status: DC

## 2021-08-03 MED ORDER — LOSARTAN POTASSIUM 50 MG PO TABS: 100.0000 mg | ORAL_TABLET | ORAL | Status: DC

## 2021-08-03 MED ORDER — ALTEPLASE 2 MG IJ SOLR: 2.0000 mg | Freq: Once | INTRAMUSCULAR | Status: DC | PRN

## 2021-08-03 MED ORDER — SODIUM CHLORIDE 0.9% FLUSH
3.0000 mL | Freq: Two times a day (BID) | INTRAVENOUS | Status: DC
  Administered 2021-08-03: 3 mL via INTRAVENOUS

## 2021-08-03 MED ORDER — RENA-VITE PO TABS
1.0000 | ORAL_TABLET | Freq: Every day | ORAL | Status: DC
  Administered 2021-08-03: 1 via ORAL
  Filled 2021-08-03 (×2): qty 1

## 2021-08-03 MED ORDER — ACETAMINOPHEN 325 MG PO TABS: 650.0000 mg | ORAL_TABLET | Freq: Four times a day (QID) | ORAL | Status: DC | PRN

## 2021-08-03 MED ORDER — SODIUM CHLORIDE 0.9% FLUSH: 3.0000 mL | INTRAVENOUS | Status: DC | PRN

## 2021-08-03 MED ORDER — HEPARIN SODIUM (PORCINE) 1000 UNIT/ML DIALYSIS
1000.0000 [IU] | INTRAMUSCULAR | Status: DC | PRN
  Filled 2021-08-03: qty 1

## 2021-08-03 MED ORDER — CHLORHEXIDINE GLUCONATE CLOTH 2 % EX PADS
6.0000 | MEDICATED_PAD | Freq: Every day | CUTANEOUS | Status: DC
  Administered 2021-08-04: 6 via TOPICAL

## 2021-08-03 MED ORDER — HYDROXYZINE HCL 25 MG PO TABS: 25.0000 mg | ORAL_TABLET | Freq: Three times a day (TID) | ORAL | Status: DC | PRN

## 2021-08-03 NOTE — ED Notes (Signed)
RN unable to obtain labs, another  RN to attempt.

## 2021-08-03 NOTE — ED Provider Notes (Addendum)
[facility] EMERGENCY DEPARTMENT Provider Note   CSN: [id number] Arrival date & time: 08/03/21  1044     History Chief Complaint  Patient presents with   Weakness    Tina Gomez is a 65 y.o. female.  HPI     65 y.o. female with medical history significant for type 2 diabetes mellitus ESRD on HD MWF, COPD w/ chronic hypoxia on 4 L nasal cannula at baseline, hypertension, hyperlipidemia and previous stroke.  She presents to the ER with chief complaint of right-sided weakness.  Last known normal last night when she went to bed.  Patient woke up at 7 AM, and noted right-sided weakness.  She had facial droop.  She still proceeded to go to the dialysis center, but was sent to the ER because of her facial droop.  Patient previously has had a stroke with same deficits.  She denies any recent URI-like symptoms.  Patient had COVID-19 just few weeks back.  There is no new pain medication, sleep medication or changes to her medications.  Past Medical History:  Diagnosis Date   Anemia    Chronic kidney disease    COPD (chronic obstructive pulmonary disease) ([place]) 05/2019   no inhaler   Diabetes mellitus without complication ([place])    no meds - diet controlled   History of blood transfusion 09/2020   1 unit   HLD (hyperlipidemia)    Hypertension    PONV (postoperative nausea and vomiting)    Restless legs syndrome (RLS)    Stroke [facility])     Patient Active Problem List   Diagnosis Date Noted   Acute respiratory disease due to COVID-19 virus 04/05/2021   ESRD on hemodialysis ([place]) 04/05/2021   Generalized weakness 04/05/2021   Acute on chronic respiratory failure with hypoxia ([place]) 04/04/2021   Fluctuating blood pressure 03/08/2021   Chronic kidney disease 03/08/2021   Acute pulmonary edema (HCC)    Volume overload 12/13/2020   CAD (coronary artery disease) 12/13/2020   Acute respiratory failure with hypoxia ([place]) 12/13/2020   Shortness of breath    CHF  (congestive heart failure) ([place]) 11/07/2020   Protein-calorie malnutrition, severe 07/28/2020   ARF (acute renal failure) ([place]) 07/23/2020   Diabetic gastroparesis (HCC)    Abnormal finding on GI tract imaging    H. pylori infection    Diverticulitis 05/24/2020   History of stroke 05/24/2020   Anemia 05/24/2020   Nausea and vomiting 05/24/2020   AKI (acute kidney injury) ([place]) 05/24/2020   Dehydration 05/24/2020   Hypomagnesemia 05/24/2020   Hypokalemia 05/24/2020   Tobacco abuse 05/24/2020   Emphysema of lung ([place]) 05/24/2020   Psoriasis 02/14/2016   Type 2 diabetes mellitus with diabetic neuropathy, unspecified ([place]) 01/18/2014   Essential hypertension, benign 01/18/2014   GERD (gastroesophageal reflux disease) 01/18/2014   Hyperlipidemia 01/18/2014   History of CVA (cerebrovascular accident) 10/22/2013   Malignant hypertension 10/22/2013    Past Surgical History:  Procedure Laterality Date   AV FISTULA PLACEMENT Left 07/27/2020   Procedure: LEFT BRACHIO-BASILIC ARTERIOVENOUS (AV) FISTULA CREATION;  Surgeon: Cherre Robins, MD;  Location: [facility];  Service: Vascular;  Laterality: Left;   [place] Left 11/02/2020   Procedure: LEFT UPPER ARM SECOND STAGE BASILIC VEIN TRANSPOSITION;  Surgeon: Cherre Robins, MD;  Location: [facility] OR;  Service: Vascular;  Laterality: Left;  PERIPHERAL NERVE BLOCK   CESAREAN SECTION     x 1   ESOPHAGOGASTRODUODENOSCOPY (EGD) WITH PROPOFOL N/A 05/26/2020   Procedure:  ESOPHAGOGASTRODUODENOSCOPY (EGD) WITH PROPOFOL;  Surgeon: Irene Shipper, MD;  Location: [facility];  Service: Endoscopy;  Laterality: N/A;   INSERTION OF DIALYSIS CATHETER Right 07/27/2020   Procedure: INSERTION OF RIGHT INTERNAL JUGULAR TUNNELED DIALYSIS CATHETER;  Surgeon: Cherre Robins, MD;  Location: [facility];  Service: Vascular;  Laterality: Right;   IR FLUORO GUIDE CV LINE RIGHT  07/25/2020   IR US GUIDE VASC ACCESS RIGHT  07/25/2020   LEFT HEART CATH AND  CORONARY ANGIOGRAPHY N/A 11/28/2020   Procedure: LEFT HEART CATH AND CORONARY ANGIOGRAPHY;  Surgeon: Troy Sine, MD;  Location: [facility];  Service: Cardiovascular;  Laterality: N/A;   TONSILLECTOMY     UPPER GI ENDOSCOPY     growth removed from voice box   WRIST SURGERY Left    ganglion cyst removal     OB History   No obstetric history on file.     Family History  Problem Relation Age of Onset   Stroke Mother    Hypertension Mother    Hyperlipidemia Mother    Hypertension Father    Hyperlipidemia Father     Social History   Tobacco Use   Smoking status: Every Day    Packs/day: 0.25    Years: 10.00    Pack years: 2.50    Types: Cigarettes   Smokeless tobacco: Never   Tobacco comments:    5 cigs daily  Vaping Use   Vaping Use: Never used  Substance Use Topics   Alcohol use: No   Drug use: No    Home Medications Prior to Admission medications   Medication Sig Start Date End Date Taking? Authorizing Provider  acetaminophen (TYLENOL) 500 MG tablet Take 1,000 mg by mouth every 6 (six) hours as needed for mild pain or headache.   Yes [provider]  albuterol (PROVENTIL) (2.5 MG/3ML) 0.083% nebulizer solution Take 3 mLs (2.5 mg total) by nebulization every 6 (six) hours as needed for wheezing or shortness of breath. 07/12/21  Yes Charlott Rakes, MD  albuterol (VENTOLIN HFA) 108 (90 Base) MCG/ACT inhaler Inhale 2 puffs into the lungs every 6 (six) hours as needed for wheezing or shortness of breath. 07/11/21  Yes Charlott Rakes, MD  aspirin 325 MG tablet Take 325 mg by mouth every evening.   Yes [provider]  B Complex-C-Zn-Folic Acid (DIALYVITE 154 WITH ZINC) 0.8 MG TABS Take 1 tablet by mouth at bedtime. 08/12/20  Yes [provider]  calcium acetate (PHOSLO) 667 MG capsule Take 667-1,334 mg by mouth See admin instructions. Take 1,334 mg by mouth three times a day before meals and 667 mg before any snack   Yes [provider]  carvedilol (COREG) 12.5 MG tablet Take 1 tablet (12.5 mg total) by mouth 2 (two) times daily with a meal. Patient taking differently: Take 12.5 mg by mouth 2 (two) times daily. 04/15/21 04/15/22 Yes Jennye Boroughs, MD  FLUoxetine (PROZAC) 20 MG capsule Take 1 capsule (20 mg total) by mouth daily. Patient taking differently: Take 20 mg by mouth at bedtime. 07/11/21  Yes Charlott Rakes, MD  gabapentin (NEURONTIN) 100 MG capsule Take 1 capsule (100 mg total) by mouth at bedtime. 04/15/21  Yes Jennye Boroughs, MD  hydrOXYzine (ATARAX/VISTARIL) 25 MG tablet Take 1 tablet (25 mg total) by mouth 3 (three) times daily as needed for anxiety. 04/15/21  Yes Jennye Boroughs, MD  lidocaine-prilocaine (EMLA) cream Apply 1 application topically See admin instructions. For use on dialysis days- fistula (  left arm) 12/04/20  Yes [provider]  loperamide (IMODIUM A-D) 2 MG tablet Take 2 mg by mouth 4 (four) times daily as needed for diarrhea or loose stools.   Yes [provider]  losartan (COZAAR) 100 MG tablet Take 1 tablet (100 mg total) by mouth every evening. Patient taking differently: Take 100 mg by mouth See admin instructions. Take 100 mg by mouth in the evening and hold if HYPOtensive (on dialysis days) 05/01/21  Yes Charlott Rakes, MD  midodrine (PROAMATINE) 10 MG tablet Take 1 tablet (10 mg total) by mouth as directed prior to dialysis. 05/16/21  Yes   ondansetron (ZOFRAN) 4 MG tablet Take 1 tablet (4 mg total) by mouth every 8 (eight) hours as needed for nausea or vomiting. 06/19/21  Yes Charlott Rakes, MD  OXYGEN Inhale 4 L/min into the lungs continuous.   Yes [provider]  Tiotropium Bromide Monohydrate (SPIRIVA RESPIMAT) 2.5 MCG/ACT AERS Inhale 2 puffs into the lungs daily. 01/30/21  Yes Charlott Rakes, MD  atorvastatin (LIPITOR) 40 MG tablet Take 1 tablet (40 mg total) by mouth daily at 6 PM. 04/15/21   Jennye Boroughs, MD  Blood Glucose Monitoring Suppl (BLOOD GLUCOSE  METER) kit Use as instructed 11/01/13   Reyne Dumas, MD  Blood Glucose Monitoring Suppl (TRUE METRIX METER) DEVI 1 each by Does not apply route 3 (three) times daily before meals. 06/02/20   Charlott Rakes, MD  dicyclomine (BENTYL) 10 MG capsule Take 1 capsule (10 mg total) by mouth 2 (two) times daily as needed for spasms. Patient not taking: Reported on 08/03/2021 05/01/21   Charlott Rakes, MD  glucose blood (TRUETEST TEST) test strip Use as instructed 06/02/20   Charlott Rakes, MD  Lancets (FREESTYLE) lancets Use as instructed 11/01/13   Reyne Dumas, MD  midodrine (PROAMATINE) 10 MG tablet Take 1 tablet (10 mg total) by mouth See admin instructions. Monday ,Wednesday and Friday. As needed if blood pressure is low. Patient not taking: Reported on 08/03/2021 04/15/21   Jennye Boroughs, MD  Misc. Devices MISC Large depends. Diagnosis - fecal incontinence 01/30/21   Charlott Rakes, MD  TRUEplus Lancets 28G MISC 1 each by Does not apply route 3 (three) times daily before meals. 06/02/20   Charlott Rakes, MD  amLODipine (NORVASC) 10 MG tablet Take 1 tablet (10 mg total) by mouth daily. 08/01/20 11/09/20  Terrilee Croak, MD  hydrALAZINE (APRESOLINE) 25 MG tablet Take 1 tablet (25 mg total) by mouth every 8 (eight) hours. 07/31/20 11/09/20  Terrilee Croak, MD  omeprazole (PRILOSEC) 20 MG capsule TAKE 1 CAPSULE (20 MG TOTAL) BY MOUTH 2 (TWO) TIMES DAILY BEFORE A MEAL. 08/01/20 08/22/20  Charlott Rakes, MD    Allergies    Codeine and Adhesive [tape]  Review of Systems   Review of Systems  Constitutional:  Positive for activity change.  Eyes:  Negative for visual disturbance.  Respiratory:  Negative for shortness of breath.   Cardiovascular:  Negative for chest pain.  Gastrointestinal:  Negative for nausea and vomiting.  Neurological:  Positive for weakness. Negative for headaches.   Physical Exam Updated Vital Signs BP (!) 160/66   Pulse 64   Temp 98.9 F (37.2 C) (Oral)   Resp 20   LMP  (LMP  Unknown)   SpO2 97%   Physical Exam Vitals and nursing note reviewed.  Constitutional:      Appearance: She is well-developed.  HENT:     Head: Atraumatic.  Cardiovascular:     Rate  and Rhythm: Normal rate.  Pulmonary:     Effort: Pulmonary effort is normal.  Musculoskeletal:     Cervical back: Normal range of motion and neck supple.  Skin:    General: Skin is warm and dry.  Neurological:     Mental Status: She is alert and oriented to person, place, and time.     Comments: Drift in both extremities, very subtle, strength is 4 out of 5 bilaterally for upper and lower extremity. Patient has a right-sided facial droop -with central sparing. Lower extremity strength is 4 out of 5 Patient has numbness over the right side of her face, otherwise no focal numbness No nystagmus, peripheral visual fields are intact  Reassessment exam: Unchanged from above Patient is areflexic in her patella, no gross rigidity for upper or lower extremities    ED Results / Procedures / Treatments   Labs (all labs ordered are listed, but only abnormal results are displayed) Labs Reviewed  CBC - Abnormal; Notable for the following components:      Result Value   Hemoglobin 11.2 (*)    MCHC 29.6 (*)    RDW 17.6 (*)    All other components within normal limits  DIFFERENTIAL - Abnormal; Notable for the following components:   Abs Immature Granulocytes 0.13 (*)    All other components within normal limits  COMPREHENSIVE METABOLIC PANEL - Abnormal; Notable for the following components:   Chloride 97 (*)    CO2 19 (*)    Glucose, Bld 150 (*)    BUN 95 (*)    Creatinine, Ser 9.30 (*)    Calcium 8.2 (*)    Albumin 2.7 (*)    GFR, Estimated 4 (*)    Anion gap 20 (*)    All other components within normal limits  I-STAT VENOUS BLOOD GAS, ED - Abnormal; Notable for the following components:   pCO2, Ven 42.7 (*)    pO2, Ven 67.0 (*)    Calcium, Ion 1.02 (*)    HCT 34.0 (*)    Hemoglobin 11.6 (*)     All other components within normal limits  RESP PANEL BY RT-PCR (FLU A&B, COVID) ARPGX2  PROTIME-INR  APTT  RAPID URINE DRUG SCREEN, HOSP PERFORMED  URINALYSIS, ROUTINE W REFLEX MICROSCOPIC    EKG EKG Interpretation  Date/Time:  Friday August 03 2021 10:50:08 EST Ventricular Rate:  69 PR Interval:  276 QRS Duration: 86 QT Interval:  420 QTC Calculation: 450 R Axis:   7 Text Interpretation: Sinus rhythm with 1st degree A-V block Septal infarct , age undetermined Abnormal ECG No acute changes No significant change since last tracing Confirmed by Varney Biles [phone]) on 08/03/2021 3:55:38 PM  Radiology CT HEAD WO CONTRAST  Result Date: 08/03/2021 CLINICAL DATA:  Acute neuro deficit. Right-sided weakness and numbness. EXAM: CT HEAD WITHOUT CONTRAST TECHNIQUE: Contiguous axial images were obtained from the base of the skull through the vertex without intravenous contrast. COMPARISON:  CT head 05/24/2020 FINDINGS: Brain: Mild atrophy. Patchy white matter hypodensity bilaterally similar to the prior study. Negative for acute infarct, hemorrhage, mass Vascular: Negative for hyperdense vessel Skull: Negative Sinuses/Orbits: Paranasal sinuses clear.  Negative orbit Other: None IMPRESSION: No acute intracranial abnormality Mild atrophy and chronic microvascular ischemic change in the white matter. Electronically Signed   By: Franchot Gallo M.D.   On: 08/03/2021 12:12   MR BRAIN WO CONTRAST  Result Date: 08/03/2021 CLINICAL DATA:  Acute neuro deficit. Bilateral leg weakness. Right facial droop EXAM: MRI  HEAD WITHOUT CONTRAST TECHNIQUE: Multiplanar, multiecho pulse sequences of the brain and surrounding structures were obtained without intravenous contrast. COMPARISON:  MRI head 10/23/2013.  CT head 08/03/2021 FINDINGS: Brain: Negative for acute infarct. Chronic infarct posterior limb internal capsule on the left. Mild chronic microvascular ischemic change in the white matter. Ventricle size  normal. Negative for hemorrhage or mass. Vascular: Normal arterial flow voids Skull and upper cervical spine: No focal skeletal abnormality Sinuses/Orbits: Negative Other: None IMPRESSION: Negative for acute infarct.  Mild chronic ischemic changes. Electronically Signed   By: Franchot Gallo M.D.   On: 08/03/2021 14:22    Procedures .Critical Care Performed by: Varney Biles, MD Authorized by: Varney Biles, MD   Critical care provider statement:    Critical care time (minutes):  6   Critical care was necessary to treat or prevent imminent or life-threatening deterioration of the following conditions:  Metabolic crisis, CNS failure or compromise, respiratory failure and renal failure   Critical care was time spent personally by me on the following activities:  Development of treatment plan with patient or surrogate, discussions with consultants, evaluation of patient's response to treatment, examination of patient, ordering and review of laboratory studies, ordering and review of radiographic studies, ordering and performing treatments and interventions, pulse oximetry, re-evaluation of patient's condition and review of old charts   Medications Ordered in ED Medications - No data to display  ED Course  I have reviewed the triage vital signs and the nursing notes.  Pertinent labs & imaging results that were available during my care of the patient were reviewed by me and considered in my medical decision making (see chart for details).  Clinical Course as of 08/03/21 1554  Fri Aug 03, 2021  1525 MR BRAIN WO CONTRAST CT scan of the brain and MRI brain are both negative for any acute findings.  Patient reassessed after the MRI. She reports that she is feeling more sleepy than she did when she first arrived.  She also feels overall having more weakness.  Repeat neuro exam is unchanged.  I do not have a great reflexes over her patella and there is no rigidity appreciated.  Reviewed  patient's medications.  She is on Neurontin and SSRI.  Clinically, this does not appear to be serotonin syndrome.  She is not on any pain or sedating medications.  At this time, neurology consult has been placed. I have placed her on BiPAP as she is getting more sleepy, she has history of COPD.  Venous blood gas also ordered. [AN]  1528 Patient's BUN is 95.  She is having some respiratory discomfort.  Now on BiPAP, feeling better.  X-ray of her chest ordered.  Venous blood gases showing pH of 7.36, no hypercapnia concerns.  Underlying issue could be need for now emergent dialysis.  Dr. Almyra Free to follow-up on the x-ray. Neurology consult still pending. [AN]  1551 Spoke with Dr. Moshe Cipro, nephrology. Made aware that patient missed her HD today and that her BUN is 95 with some somnolence and that patient is now hypoxic requiring BiPAP support.  Nephrology on board, will dialyze [AN]    Clinical Course User Index [AN] Varney Biles, MD   MDM Rules/Calculators/A&P                          65 year old female with history of ESRD on HD, prior strokes, COPD comes in with chief complaint of right-sided weakness.  She reports generalized overall weakness, but  right side is worse than left.  Family appreciates mildly slurred speech.  Patient clearly has a facial droop, otherwise she has some subjective weakness over the right upper extremity more so compared than left.   Initial concerns is that patient probably had a stroke. Outside of the tPA window given last normal last night.   Final Clinical Impression(s) / ED Diagnoses Final diagnoses:  Generalized weakness  Uremia  Acute hypoxemic respiratory failure [facility])    Rx / DC Orders ED Discharge Orders     None        Varney Biles, MD 08/03/21 [place]    Varney Biles, MD 08/03/21 1555

## 2021-08-03 NOTE — ED Triage Notes (Signed)
Pt reports she woke up around 7am with bilateral leg weakness but more so on the right leg, LSN 10 pm last night. Pts daughter reports a right sided facial droop this morning. Pt receives dialysis 4 days a week, she insisted on going this morning but staff at dialysis center sent them here due to facial droop. Pt reports she did fall out of bed yesterday morning but denies hitting her head. Pt taken to bridge for EDP assessment.

## 2021-08-03 NOTE — H&P (Signed)
History and Physical    Tina Gomez:[id number] DOB: [birth year] DOA: 08/03/2021  PCP: Charlott Rakes, MD Consultants:  cardiology: Dr. Stanford Breed, nephrology: Dr. Moshe Cipro  Patient coming from:  Home - lives with daughter   Chief Complaint: right sided weakness, slurred speech, facial droop   HPI: Tina Gomez is a 65 y.o. female with medical history significant of T2DM, ESRD on HD MWFS, COPD with chronic hypoxia on 4L [place], HTN, HLD, RLS, CHF and CAD, anxiety and depression and hx of previous CVA who presented to Ed with complaints of right sided weakness, slurred speech and right sided facial droop. She was last known well last night at about 10:00pm. She tells me her electric blanket turned off during the night and when she went to reach for it she fell out of the bed and hit her right hand and arm. Her daughter helped her back in bed and she went to sleep. When she woke up this AM around 7:00AM she had noticeable right sided weakness and was unable to get to bathroom and called her daughter. Her daughter states she didn't really notice right sided weakness, she was globally weak like a "wet noodle." She didn't want to come to hospital. Daughter also noticed a possible right sided facial droop and slurred speech, no confusion per patient and her sister, but daughter states she had some mild confusion when she got to hospital today that improved and she is back to baseline.   She missed dialysis on Wednesday and went today but they sent her to ER and she missed her session.   She denies any fever, chills, headaches, chest pain or palpitations, shortness of breath or cough, stomach pain, N/V/D, leg swelling, dysuria. No sick contacts.    ED Course: vitals: afebrile, bp: 193/67, HR: 70, RR: 21, oxygen 98% 3L [place]-->86% on 5L changed to bipap.  Pertinent labs: hgb: 11.2, CO2: 19, BUN: 95, creatinine: 9.30, AG: 20, covid/flu negative, venous gas pH: 7.361, pCO2: 42, o2: 67  CXR:  cardiomegaly. Decreased volume in left lung. Increased dentisy in left mid and left lower lung fields. Worsening atelectasis/pneumonia in left lung along with increase in left pleural effusion.  CTH: no acute intracranial abnormality  MRI brain: negative for acute infarct.  In ED: w/u for stroke, labs. Nephrology consulted and [facility] was asked to admit.   Review of Systems: As per HPI; otherwise review of systems reviewed and negative.   Ambulatory Status:  Ambulates with walker    Past Medical History:  Diagnosis Date   Anemia    Chronic kidney disease    COPD (chronic obstructive pulmonary disease) ([place]) 05/2019   no inhaler   Diabetes mellitus without complication ([place])    no meds - diet controlled   History of blood transfusion 09/2020   1 unit   HLD (hyperlipidemia)    Hypertension    PONV (postoperative nausea and vomiting)    Restless legs syndrome (RLS)    Stroke [facility])     Past Surgical History:  Procedure Laterality Date   AV FISTULA PLACEMENT Left 07/27/2020   Procedure: LEFT BRACHIO-BASILIC ARTERIOVENOUS (AV) FISTULA CREATION;  Surgeon: Cherre Robins, MD;  Location: [facility];  Service: Vascular;  Laterality: Left;   [place] Left 11/02/2020   Procedure: LEFT UPPER ARM SECOND STAGE BASILIC VEIN TRANSPOSITION;  Surgeon: Cherre Robins, MD;  Location: [place];  Service: Vascular;  Laterality: Left;  [place]  x 1   ESOPHAGOGASTRODUODENOSCOPY (EGD) WITH PROPOFOL N/A 05/26/2020   Procedure: ESOPHAGOGASTRODUODENOSCOPY (EGD) WITH PROPOFOL;  Surgeon: Irene Shipper, MD;  Location: [facility] ENDOSCOPY;  Service: Endoscopy;  Laterality: N/A;   INSERTION OF DIALYSIS CATHETER Right 07/27/2020   Procedure: INSERTION OF RIGHT INTERNAL JUGULAR TUNNELED DIALYSIS CATHETER;  Surgeon: Cherre Robins, MD;  Location: [facility];  Service: Vascular;  Laterality: Right;   IR FLUORO GUIDE CV LINE RIGHT  07/25/2020   IR US GUIDE VASC ACCESS RIGHT   07/25/2020   LEFT HEART CATH AND CORONARY ANGIOGRAPHY N/A 11/28/2020   Procedure: LEFT HEART CATH AND CORONARY ANGIOGRAPHY;  Surgeon: Troy Sine, MD;  Location: [place] CV LAB;  Service: Cardiovascular;  Laterality: N/A;   TONSILLECTOMY     UPPER GI ENDOSCOPY     growth removed from voice box   WRIST SURGERY Left    ganglion cyst removal    Social History   Socioeconomic History   Marital status: Divorced    Spouse name: Not on file   Number of children: Not on file   Years of education: Not on file   Highest education level: Not on file  Occupational History   Not on file  Tobacco Use   Smoking status: Every Day    Packs/day: 0.25    Years: 10.00    Pack years: 2.50    Types: Cigarettes   Smokeless tobacco: Never   Tobacco comments:    5 cigs daily  Vaping Use   Vaping Use: Never used  Substance and Sexual Activity   Alcohol use: No   Drug use: No   Sexual activity: Not on file  Other Topics Concern   Not on file  Social History Narrative   Not on file   Social Determinants of Health   Financial Resource Strain: Not on file  Food Insecurity: Not on file  Transportation Needs: Not on file  Physical Activity: Not on file  Stress: Not on file  Social Connections: Not on file  Intimate Partner Violence: Not on file    Allergies  Allergen Reactions   Codeine Nausea And Vomiting   Adhesive [Tape] Other (See Comments)    Tape breaks out the skin if it is left on for a lengthy period of time    Family History  Problem Relation Age of Onset   Stroke Mother    Hypertension Mother    Hyperlipidemia Mother    Hypertension Father    Hyperlipidemia Father     Prior to Admission medications   Medication Sig Start Date End Date Taking? Authorizing Provider  acetaminophen (TYLENOL) 500 MG tablet Take 1,000 mg by mouth every 6 (six) hours as needed for mild pain or headache.   Yes [provider]  albuterol (PROVENTIL) (2.5 MG/3ML) 0.083%  nebulizer solution Take 3 mLs (2.5 mg total) by nebulization every 6 (six) hours as needed for wheezing or shortness of breath. 07/12/21  Yes Charlott Rakes, MD  albuterol (VENTOLIN HFA) 108 (90 Base) MCG/ACT inhaler Inhale 2 puffs into the lungs every 6 (six) hours as needed for wheezing or shortness of breath. 07/11/21  Yes Charlott Rakes, MD  aspirin 325 MG tablet Take 325 mg by mouth every evening.   Yes [provider]  B Complex-C-Zn-Folic Acid (DIALYVITE 607 WITH ZINC) 0.8 MG TABS Take 1 tablet by mouth at bedtime. 08/12/20  Yes [provider]  calcium acetate (PHOSLO) 667 MG capsule Take 667-1,334 mg by mouth See admin instructions.  Take 1,334 mg by mouth three times a day before meals and 667 mg before any snack   Yes [provider]  carvedilol (COREG) 12.5 MG tablet Take 1 tablet (12.5 mg total) by mouth 2 (two) times daily with a meal. Patient taking differently: Take 12.5 mg by mouth 2 (two) times daily. 04/15/21 04/15/22 Yes Jennye Boroughs, MD  FLUoxetine (PROZAC) 20 MG capsule Take 1 capsule (20 mg total) by mouth daily. Patient taking differently: Take 20 mg by mouth at bedtime. 07/11/21  Yes Charlott Rakes, MD  gabapentin (NEURONTIN) 100 MG capsule Take 1 capsule (100 mg total) by mouth at bedtime. 04/15/21  Yes Jennye Boroughs, MD  hydrOXYzine (ATARAX/VISTARIL) 25 MG tablet Take 1 tablet (25 mg total) by mouth 3 (three) times daily as needed for anxiety. 04/15/21  Yes Jennye Boroughs, MD  lidocaine-prilocaine (EMLA) cream Apply 1 application topically See admin instructions. For use on dialysis days- fistula (left arm) 12/04/20  Yes [provider]  loperamide (IMODIUM A-D) 2 MG tablet Take 2 mg by mouth 4 (four) times daily as needed for diarrhea or loose stools.   Yes [provider]  losartan (COZAAR) 100 MG tablet Take 1 tablet (100 mg total) by mouth every evening. Patient taking differently: Take 100 mg by mouth See admin instructions. Take 100  mg by mouth in the evening and hold if HYPOtensive (on dialysis days) 05/01/21  Yes Charlott Rakes, MD  midodrine (PROAMATINE) 10 MG tablet Take 1 tablet (10 mg total) by mouth as directed prior to dialysis. 05/16/21  Yes   ondansetron (ZOFRAN) 4 MG tablet Take 1 tablet (4 mg total) by mouth every 8 (eight) hours as needed for nausea or vomiting. 06/19/21  Yes Charlott Rakes, MD  OXYGEN Inhale 4 L/min into the lungs continuous.   Yes [provider]  Tiotropium Bromide Monohydrate (SPIRIVA RESPIMAT) 2.5 MCG/ACT AERS Inhale 2 puffs into the lungs daily. 01/30/21  Yes Charlott Rakes, MD  atorvastatin (LIPITOR) 40 MG tablet Take 1 tablet (40 mg total) by mouth daily at 6 PM. 04/15/21   Jennye Boroughs, MD  Blood Glucose Monitoring Suppl (BLOOD GLUCOSE METER) kit Use as instructed 11/01/13   Reyne Dumas, MD  Blood Glucose Monitoring Suppl (TRUE METRIX METER) DEVI 1 each by Does not apply route 3 (three) times daily before meals. 06/02/20   Charlott Rakes, MD  dicyclomine (BENTYL) 10 MG capsule Take 1 capsule (10 mg total) by mouth 2 (two) times daily as needed for spasms. Patient not taking: Reported on 08/03/2021 05/01/21   Charlott Rakes, MD  glucose blood (TRUETEST TEST) test strip Use as instructed 06/02/20   Charlott Rakes, MD  Lancets (FREESTYLE) lancets Use as instructed 11/01/13   Reyne Dumas, MD  midodrine (PROAMATINE) 10 MG tablet Take 1 tablet (10 mg total) by mouth See admin instructions. Monday ,Wednesday and Friday. As needed if blood pressure is low. Patient not taking: Reported on 08/03/2021 04/15/21   Jennye Boroughs, MD  Misc. Devices MISC Large depends. Diagnosis - fecal incontinence 01/30/21   Charlott Rakes, MD  TRUEplus Lancets 28G MISC 1 each by Does not apply route 3 (three) times daily before meals. 06/02/20   Charlott Rakes, MD  amLODipine (NORVASC) 10 MG tablet Take 1 tablet (10 mg total) by mouth daily. 08/01/20 11/09/20  Terrilee Croak, MD  hydrALAZINE (APRESOLINE) 25 MG  tablet Take 1 tablet (25 mg total) by mouth every 8 (eight) hours. 07/31/20 11/09/20  Terrilee Croak, MD  omeprazole (PRILOSEC) 20 MG  capsule TAKE 1 CAPSULE (20 MG TOTAL) BY MOUTH 2 (TWO) TIMES DAILY BEFORE A MEAL. 08/01/20 08/22/20  Charlott Rakes, MD    Physical Exam: Vitals:   08/03/21 1530 08/03/21 1545 08/03/21 1600 08/03/21 1630  BP: (!) 160/66 (!) 164/57 (!) 168/72 (!) 177/64  Pulse: 64 64 65 65  Resp: 20 15 (!) 21 18  Temp:      TempSrc:      SpO2: 97% 97% 98% 99%     General:  Appears calm and comfortable and is in NAD. Bipap in place  Eyes:  PERRL, EOMI, normal lids, iris ENT:  grossly normal hearing could not assess mouth/teeth with bipap  Neck:  no LAD, masses or thyromegaly; no carotid bruits Cardiovascular:  RRR, no m/r/g. No LE edema.  Respiratory:   CTA bilaterally with no wheezes/rales/rhonchi.  Normal respiratory effort. Abdomen:  soft, NT, ND, NABS Back:   normal alignment, no CVAT Skin:  no rash or induration seen on limited exam. Laceration on her right distal forearm and hematoma of right 2nd finger.  Musculoskeletal:  globally weak. BLE: 2/5 BUE: 4/5, good ROM, no bony abnormality Lower extremity:  No LE edema.  Limited foot exam with no ulcerations.  2+ distal pulses. Psychiatric:  grossly normal mood and affect, speech fluent and appropriate, Aox2 (hard to assess with her on bipap) no confusion noted. Answers all questions, alert and oriented except for year and sister states she does not keep up with date. Knew birthdays, president, place, city.  Neurologic:  CN 2-12 grossly intact, right sided ptosis, moves all extremities in coordinated fashion, sensation intact. Limited exam on bipap. Could not assess mouth. Negative pronator drift. Gait deferred.     Radiological Exams on Admission: Independently reviewed - see discussion in A/P where applicable  CT HEAD WO CONTRAST  Result Date: 08/03/2021 CLINICAL DATA:  Acute neuro deficit. Right-sided weakness and  numbness. EXAM: CT HEAD WITHOUT CONTRAST TECHNIQUE: Contiguous axial images were obtained from the base of the skull through the vertex without intravenous contrast. COMPARISON:  CT head 05/24/2020 FINDINGS: Brain: Mild atrophy. Patchy white matter hypodensity bilaterally similar to the prior study. Negative for acute infarct, hemorrhage, mass Vascular: Negative for hyperdense vessel Skull: Negative Sinuses/Orbits: Paranasal sinuses clear.  Negative orbit Other: None IMPRESSION: No acute intracranial abnormality Mild atrophy and chronic microvascular ischemic change in the white matter. Electronically Signed   By: Franchot Gallo M.D.   On: 08/03/2021 12:12   MR BRAIN WO CONTRAST  Result Date: 08/03/2021 CLINICAL DATA:  Acute neuro deficit. Bilateral leg weakness. Right facial droop EXAM: MRI HEAD WITHOUT CONTRAST TECHNIQUE: Multiplanar, multiecho pulse sequences of the brain and surrounding structures were obtained without intravenous contrast. COMPARISON:  MRI head 10/23/2013.  CT head 08/03/2021 FINDINGS: Brain: Negative for acute infarct. Chronic infarct posterior limb internal capsule on the left. Mild chronic microvascular ischemic change in the white matter. Ventricle size normal. Negative for hemorrhage or mass. Vascular: Normal arterial flow voids Skull and upper cervical spine: No focal skeletal abnormality Sinuses/Orbits: Negative Other: None IMPRESSION: Negative for acute infarct.  Mild chronic ischemic changes. Electronically Signed   By: Franchot Gallo M.D.   On: 08/03/2021 14:22   DG Chest Port 1 View  Result Date: 08/03/2021 CLINICAL DATA:  Difficulty breathing EXAM: PORTABLE CHEST 1 VIEW COMPARISON:  Previous studies including the examination of 04/07/2021 FINDINGS: Transverse diameter of heart is increased. Coronary artery calcifications are seen. There is decreased volume in the left lung. Increased density is  seen in the left mid and left lower lung fields. There is blunting of left  lateral CP angle. There is interval improvement in aeration of right mid and right lower lung fields. Right lateral CP angle is clear. There is no pneumothorax. IMPRESSION: Cardiomegaly. There is decreased volume in the left lung. There is increased density in the left mid and left lower lung fields. Findings suggest worsening of atelectasis/pneumonia in the left lung along with increase in left pleural effusion. There is interval clearing of right pleural effusion and improvement in aeration of right mid and right lower lung fields. Electronically Signed   By: Elmer Picker M.D.   On: 08/03/2021 16:25    EKG: Independently reviewed.  NSR with rate 69, first degree AV block ; nonspecific ST changes with no evidence of acute ischemia. Similar to previous ekg.    Labs on Admission: I have personally reviewed the available labs and imaging studies at the time of the admission.  Pertinent labs:  hgb: 11.2,  CO2: 19,  BUN: 95,  creatinine: 9.30,  AG: 20,  covid/flu negative,  venous gas pH: 7.361, pCO2: 42, o2: 67     Assessment/Plan Principal Problem:   Acute on chronic respiratory failure with hypoxia ([place]) 64 year old female with ESRD on HD and CHF with EF of 30% and COPD on 4L [place] presenting with complaints of right sided weakness but had hypoxia to 86% on 5L oxygen, requiring bipap.  -admit to progressive  -likely secondary to volume overload in setting of missed dialysis session.  -CXR with worsening atelectasis/pneumonia in left lung field along with increased left pleural effusion. Patient has had no fever/chills, cough, shortness of breath, fever or WBC. Likely more from fluid overload. Will repeat 2V CXR in AM after dialysis and follow cbc/fever curve and clinical picture.  -check BNP, but volume control per dialysis  -wean as tolerated, plans for dialysis today   Active Problems:   ESRD on hemodialysis ([place]) Usually dialyzes MWF and Saturday and has missed 2 sessions.   Volume overloaded. Nephrology consulted and plans for dialysis today  Electrolytes stable, requiring bipap.  -some initial confusion, but at baseline and oriented at time of exam     Metabolic acidosis, increased anion gap (IAG) Secondary to worsening renal function Per nephrology   Acute weakness/facial droop  No appreciated right sided weakness based off my exam and daughter states that facial droop has nearly resolved, also still has right sided eye droop CTH and brain MRI are both negative for acute stroke  PT/OT to eval as she is globally significantly weak, although uremia could be contributing.  Neuro checks q 4 hours x 4.     Type 2 diabetes mellitus with diabetic neuropathy, unspecified ([place]) -last a1c 7/22: 6.2, diet controlled -continue gabapentin for neuropathy -SSI and accuchecks per protocol   COPD on chronic oxygen 4L  Continue home inhalers, no exacerbation at time of admit  Requiring bipap due to volume overload in setting of missed dialysis     History of CVA (cerebrovascular accident) Continue ASA and statin     Essential hypertension, benign Elevated in setting of volume overload from missed dialysis sessions.  Starting home medication Midodrine before dialysis session     Anemia Hgb 11.2, stable, continue to monitor     CAD/Hyperlipidemia Continue statin and ASA Cardiac cath 11/2020: 70% proximal right coronary artery  followed by 50% with otherwise non obstructive disease.     Anxiety and depression Continue prozac  and hydroxyzine prn     Chronic systolic CHF (congestive heart failure) ([place])  Last echo: EF of 30%. Left ventricle has moderate to severely decreased fx. Global hypokinesis. Grade II diastolic dysfunction  -fluid overloaded in setting of missed dialysis sessions. CXR with worsening left pleural effusion.  -fluid management per dialysis with plans for today -check BNP  -intake/output and daily weights  -continue medical management with  coreg and losartan    There is no height or weight on file to calculate BMI.    Level of care: Progressive DVT prophylaxis:  heparin  Code Status:  Full - confirmed with patient  Family Communication: sister present at bedside: brenda, talked to daughter dana Stegmann on phone. [phone] Disposition Plan:  The patient is from: home  Anticipated d/c is to: per day team   Placed in observation as anticipate less than 2 midnight stay. Requires hospitalization due to need for bipap with constant monitoring for acute on chronic respiratory failure, emergent dialysis, and need for therapy eval as she is not safe to go home.    Patient is currently: stable  Consults called: nephrology   Admission status:  observation    Orma Flaming MD [facility]   How to contact the [facility] Attending or Consulting provider [place] or covering provider during after hours [place], for this patient?  Check the care team in [facility] and look for a) attending/consulting [facility] provider listed and b) the [facility] team listed Log into [email or web address] and use [place]'s universal password to access. If you do not have the password, please contact the hospital operator. Locate the [facility] provider you are looking for under [facility] and page to a number that you can be directly reached. If you still have difficulty reaching the provider, please page the [facility] (Director on Call) for the Hospitalists listed on amion for assistance.   08/03/2021, 5:20 PM

## 2021-08-03 NOTE — ED Notes (Signed)
RN at bedside after pt returned from MRI. RN connected pt to monitor, SpO2 85% on 5L via [place]. Nanavati MD aware. Pt placed on 15L [place], SpO2 increased to 90%.

## 2021-08-03 NOTE — ED Notes (Signed)
Patient transported to CT 

## 2021-08-03 NOTE — Consult Note (Addendum)
[facility] Renal Consultation Note    Indication for Consultation:  Management of ESRD/hemodialysis; anemia, hypertension/volume and secondary hyperparathyroidism PCP: Dr. Collene Mares Nephrology: Dr. Corliss Parish  HPI: Tina Gomez is a 65 y.o. female with ESRD on hemodialysis M,W,F, S at [facility]. PMH: COPD, DMT2, HTN, CVA, HFrEF, anemia of chronic kidney disease, SHPT. She usually runs 4 days a week, but this schedule was disrupted by [employer] schedule. She also has been leaving 1.6-2.2 kg above EDW last few treatments, has missed and truncated treatments including today.   Apparently she awoke this AM with R sided weakness,facial droop. She went to HD center so that she could be assessed by RN staff. She was sent to ED from HD center for evaluation for possible CVA. She was not SOB prior to arrival to ED. However, after she arrived to ED, O2 sats were noted to be 86 on O2 via [place]. She was placed on BIPAP. Findings suggest worsening of atelectasis/pneumonia in the left lung along with increase in left pleural effusion.There is interval clearing of right pleural effusion and improvement in aeration of right mid and right lower lung fields. ABG 7.36 PCO2 42.7 PO2 67 . MRI of head Negative for acute infarct. CO2 19 otherwise Labs unremarkable for HD patient. She is being admitted for acute on chronic respiratory failure and to complete work up for possible CVA.  Seen in ED. Continues on BIPAP without WOB. Neuro appears intact-can't visualize face but grips equal. MAE X 4. C/O nausea, asking to eat. Planning for urgent HD this evening. Pt says that she is not SOB like she has been in the past.     Past Medical History:  Diagnosis Date   Anemia    Chronic kidney disease    COPD (chronic obstructive pulmonary disease) ([place]) 05/2019   no inhaler   Diabetes mellitus without complication (HCC)    no meds - diet controlled   History of blood  transfusion 09/2020   1 unit   HLD (hyperlipidemia)    Hypertension    PONV (postoperative nausea and vomiting)    Restless legs syndrome (RLS)    Stroke [facility])    Past Surgical History:  Procedure Laterality Date   AV FISTULA PLACEMENT Left 07/27/2020   Procedure: LEFT BRACHIO-BASILIC ARTERIOVENOUS (AV) FISTULA CREATION;  Surgeon: Cherre Robins, MD;  Location: [place];  Service: Vascular;  Laterality: Left;   [place] Left 11/02/2020   Procedure: LEFT UPPER ARM SECOND STAGE BASILIC VEIN TRANSPOSITION;  Surgeon: Cherre Robins, MD;  Location: [place];  Service: Vascular;  Laterality: Left;  PERIPHERAL NERVE BLOCK   CESAREAN SECTION     x 1   ESOPHAGOGASTRODUODENOSCOPY (EGD) WITH PROPOFOL N/A 05/26/2020   Procedure: ESOPHAGOGASTRODUODENOSCOPY (EGD) WITH PROPOFOL;  Surgeon: Irene Shipper, MD;  Location: [facility] ENDOSCOPY;  Service: Endoscopy;  Laterality: N/A;   INSERTION OF DIALYSIS CATHETER Right 07/27/2020   Procedure: INSERTION OF RIGHT INTERNAL JUGULAR TUNNELED DIALYSIS CATHETER;  Surgeon: Cherre Robins, MD;  Location: [place];  Service: Vascular;  Laterality: Right;   IR FLUORO GUIDE CV LINE RIGHT  07/25/2020   IR US GUIDE VASC ACCESS RIGHT  07/25/2020   LEFT HEART CATH AND CORONARY ANGIOGRAPHY N/A 11/28/2020   Procedure: LEFT HEART CATH AND CORONARY ANGIOGRAPHY;  Surgeon: Troy Sine, MD;  Location: [place] CV LAB;  Service: Cardiovascular;  Laterality: N/A;   TONSILLECTOMY     UPPER GI ENDOSCOPY  growth removed from voice box   WRIST SURGERY Left    ganglion cyst removal   Family History  Problem Relation Age of Onset   Stroke Mother    Hypertension Mother    Hyperlipidemia Mother    Hypertension Father    Hyperlipidemia Father    Social History:  reports that she has been smoking cigarettes. She has a 2.50 pack-year smoking history. She has never used smokeless tobacco. She reports that she does not drink alcohol and does not use drugs. Allergies   Allergen Reactions   Codeine Nausea And Vomiting   Adhesive [Tape] Other (See Comments)    Tape breaks out the skin if it is left on for a lengthy period of time   Prior to Admission medications   Medication Sig Start Date End Date Taking? Authorizing Provider  acetaminophen (TYLENOL) 500 MG tablet Take 1,000 mg by mouth every 6 (six) hours as needed for mild pain or headache.   Yes [provider]  albuterol (PROVENTIL) (2.5 MG/3ML) 0.083% nebulizer solution Take 3 mLs (2.5 mg total) by nebulization every 6 (six) hours as needed for wheezing or shortness of breath. 07/12/21  Yes Charlott Rakes, MD  albuterol (VENTOLIN HFA) 108 (90 Base) MCG/ACT inhaler Inhale 2 puffs into the lungs every 6 (six) hours as needed for wheezing or shortness of breath. 07/11/21  Yes Charlott Rakes, MD  aspirin 325 MG tablet Take 325 mg by mouth every evening.   Yes [provider]  B Complex-C-Zn-Folic Acid (DIALYVITE 671 WITH ZINC) 0.8 MG TABS Take 1 tablet by mouth at bedtime. 08/12/20  Yes [provider]  calcium acetate (PHOSLO) 667 MG capsule Take 667-1,334 mg by mouth See admin instructions. Take 1,334 mg by mouth three times a day before meals and 667 mg before any snack   Yes [provider]  carvedilol (COREG) 12.5 MG tablet Take 1 tablet (12.5 mg total) by mouth 2 (two) times daily with a meal. Patient taking differently: Take 12.5 mg by mouth 2 (two) times daily. 04/15/21 04/15/22 Yes Jennye Boroughs, MD  FLUoxetine (PROZAC) 20 MG capsule Take 1 capsule (20 mg total) by mouth daily. Patient taking differently: Take 20 mg by mouth at bedtime. 07/11/21  Yes Charlott Rakes, MD  gabapentin (NEURONTIN) 100 MG capsule Take 1 capsule (100 mg total) by mouth at bedtime. 04/15/21  Yes Jennye Boroughs, MD  hydrOXYzine (ATARAX/VISTARIL) 25 MG tablet Take 1 tablet (25 mg total) by mouth 3 (three) times daily as needed for anxiety. 04/15/21  Yes Jennye Boroughs, MD  lidocaine-prilocaine (EMLA)  cream Apply 1 application topically See admin instructions. For use on dialysis days- fistula (left arm) 12/04/20  Yes [provider]  loperamide (IMODIUM A-D) 2 MG tablet Take 2 mg by mouth 4 (four) times daily as needed for diarrhea or loose stools.   Yes [provider]  losartan (COZAAR) 100 MG tablet Take 1 tablet (100 mg total) by mouth every evening. Patient taking differently: Take 100 mg by mouth See admin instructions. Take 100 mg by mouth in the evening and hold if HYPOtensive (on dialysis days) 05/01/21  Yes Charlott Rakes, MD  midodrine (PROAMATINE) 10 MG tablet Take 1 tablet (10 mg total) by mouth as directed prior to dialysis. 05/16/21  Yes   ondansetron (ZOFRAN) 4 MG tablet Take 1 tablet (4 mg total) by mouth every 8 (eight) hours as needed for nausea or vomiting. 06/19/21  Yes Charlott Rakes, MD  OXYGEN Inhale 4 L/min into  the lungs continuous.   Yes [provider]  Tiotropium Bromide Monohydrate (SPIRIVA RESPIMAT) 2.5 MCG/ACT AERS Inhale 2 puffs into the lungs daily. 01/30/21  Yes Charlott Rakes, MD  atorvastatin (LIPITOR) 40 MG tablet Take 1 tablet (40 mg total) by mouth daily at 6 PM. 04/15/21   Jennye Boroughs, MD  Blood Glucose Monitoring Suppl (BLOOD GLUCOSE METER) kit Use as instructed 11/01/13   Reyne Dumas, MD  Blood Glucose Monitoring Suppl (TRUE METRIX METER) DEVI 1 each by Does not apply route 3 (three) times daily before meals. 06/02/20   Charlott Rakes, MD  dicyclomine (BENTYL) 10 MG capsule Take 1 capsule (10 mg total) by mouth 2 (two) times daily as needed for spasms. Patient not taking: Reported on 08/03/2021 05/01/21   Charlott Rakes, MD  glucose blood (TRUETEST TEST) test strip Use as instructed 06/02/20   Charlott Rakes, MD  Lancets (FREESTYLE) lancets Use as instructed 11/01/13   Reyne Dumas, MD  midodrine (PROAMATINE) 10 MG tablet Take 1 tablet (10 mg total) by mouth See admin instructions. Monday ,Wednesday and Friday. As needed if  blood pressure is low. Patient not taking: Reported on 08/03/2021 04/15/21   Jennye Boroughs, MD  Misc. Devices MISC Large depends. Diagnosis - fecal incontinence 01/30/21   Charlott Rakes, MD  TRUEplus Lancets 28G MISC 1 each by Does not apply route 3 (three) times daily before meals. 06/02/20   Charlott Rakes, MD  amLODipine (NORVASC) 10 MG tablet Take 1 tablet (10 mg total) by mouth daily. 08/01/20 11/09/20  Terrilee Croak, MD  hydrALAZINE (APRESOLINE) 25 MG tablet Take 1 tablet (25 mg total) by mouth every 8 (eight) hours. 07/31/20 11/09/20  Terrilee Croak, MD  omeprazole (PRILOSEC) 20 MG capsule TAKE 1 CAPSULE (20 MG TOTAL) BY MOUTH 2 (TWO) TIMES DAILY BEFORE A MEAL. 08/01/20 08/22/20  Charlott Rakes, MD   No current facility-administered medications for this encounter.   Current Outpatient Medications  Medication Sig Dispense Refill   acetaminophen (TYLENOL) 500 MG tablet Take 1,000 mg by mouth every 6 (six) hours as needed for mild pain or headache.     albuterol (PROVENTIL) (2.5 MG/3ML) 0.083% nebulizer solution Take 3 mLs (2.5 mg total) by nebulization every 6 (six) hours as needed for wheezing or shortness of breath. 75 mL 0   albuterol (VENTOLIN HFA) 108 (90 Base) MCG/ACT inhaler Inhale 2 puffs into the lungs every 6 (six) hours as needed for wheezing or shortness of breath. 18 g 2   aspirin 325 MG tablet Take 325 mg by mouth every evening.     B Complex-C-Zn-Folic Acid (DIALYVITE 537 WITH ZINC) 0.8 MG TABS Take 1 tablet by mouth at bedtime.     calcium acetate (PHOSLO) 667 MG capsule Take 667-1,334 mg by mouth See admin instructions. Take 1,334 mg by mouth three times a day before meals and 667 mg before any snack     carvedilol (COREG) 12.5 MG tablet Take 1 tablet (12.5 mg total) by mouth 2 (two) times daily with a meal. (Patient taking differently: Take 12.5 mg by mouth 2 (two) times daily.)     FLUoxetine (PROZAC) 20 MG capsule Take 1 capsule (20 mg total) by mouth daily. (Patient taking  differently: Take 20 mg by mouth at bedtime.) 30 capsule 2   gabapentin (NEURONTIN) 100 MG capsule Take 1 capsule (100 mg total) by mouth at bedtime.     hydrOXYzine (ATARAX/VISTARIL) 25 MG tablet Take 1 tablet (25 mg total) by mouth 3 (three) times daily as  needed for anxiety.     lidocaine-prilocaine (EMLA) cream Apply 1 application topically See admin instructions. For use on dialysis days- fistula (left arm)     loperamide (IMODIUM A-D) 2 MG tablet Take 2 mg by mouth 4 (four) times daily as needed for diarrhea or loose stools.     losartan (COZAAR) 100 MG tablet Take 1 tablet (100 mg total) by mouth every evening. (Patient taking differently: Take 100 mg by mouth See admin instructions. Take 100 mg by mouth in the evening and hold if HYPOtensive (on dialysis days)) 30 tablet 6   midodrine (PROAMATINE) 10 MG tablet Take 1 tablet (10 mg total) by mouth as directed prior to dialysis. 30 tablet 4   ondansetron (ZOFRAN) 4 MG tablet Take 1 tablet (4 mg total) by mouth every 8 (eight) hours as needed for nausea or vomiting. 60 tablet 3   OXYGEN Inhale 4 L/min into the lungs continuous.     Tiotropium Bromide Monohydrate (SPIRIVA RESPIMAT) 2.5 MCG/ACT AERS Inhale 2 puffs into the lungs daily. 4 g 3   atorvastatin (LIPITOR) 40 MG tablet Take 1 tablet (40 mg total) by mouth daily at 6 PM.     Blood Glucose Monitoring Suppl (BLOOD GLUCOSE METER) kit Use as instructed 1 each 0   Blood Glucose Monitoring Suppl (TRUE METRIX METER) DEVI 1 each by Does not apply route 3 (three) times daily before meals. 1 each 0   dicyclomine (BENTYL) 10 MG capsule Take 1 capsule (10 mg total) by mouth 2 (two) times daily as needed for spasms. (Patient not taking: Reported on 08/03/2021) 60 capsule 3   glucose blood (TRUETEST TEST) test strip Use as instructed 100 each 12   Lancets (FREESTYLE) lancets Use as instructed 100 each 12   midodrine (PROAMATINE) 10 MG tablet Take 1 tablet (10 mg total) by mouth See admin instructions.  Monday ,Wednesday and Friday. As needed if blood pressure is low. (Patient not taking: Reported on 08/03/2021)     Misc. Devices MISC Large depends. Diagnosis - fecal incontinence 1 each 0   TRUEplus Lancets 28G MISC 1 each by Does not apply route 3 (three) times daily before meals. 100 each 12   Labs: Basic Metabolic Panel: Recent Labs  Lab 08/03/21 1103 08/03/21 1523  NA 136 137  K 4.2 3.5  CL 97*  --   CO2 19*  --   GLUCOSE 150*  --   BUN 95*  --   CREATININE 9.30*  --   CALCIUM 8.2*  --    Liver Function Tests: Recent Labs  Lab 08/03/21 1103  AST 29  ALT 14  ALKPHOS 63  BILITOT 0.7  PROT 6.5  ALBUMIN 2.7*   No results for input(s): LIPASE, AMYLASE in the last 168 hours. No results for input(s): AMMONIA in the last 168 hours. CBC: Recent Labs  Lab 08/03/21 1103 08/03/21 1523  WBC 10.4  --   NEUTROABS 7.2  --   HGB 11.2* 11.6*  HCT 37.8 34.0*  MCV 92.2  --   PLT 287  --    Cardiac Enzymes: No results for input(s): CKTOTAL, CKMB, CKMBINDEX, TROPONINI in the last 168 hours. CBG: No results for input(s): GLUCAP in the last 168 hours. Iron Studies: No results for input(s): IRON, TIBC, TRANSFERRIN, FERRITIN in the last 72 hours. Studies/Results: CT HEAD WO CONTRAST  Result Date: 08/03/2021 CLINICAL DATA:  Acute neuro deficit. Right-sided weakness and numbness. EXAM: CT HEAD WITHOUT CONTRAST TECHNIQUE: Contiguous axial images were obtained  from the base of the skull through the vertex without intravenous contrast. COMPARISON:  CT head 05/24/2020 FINDINGS: Brain: Mild atrophy. Patchy white matter hypodensity bilaterally similar to the prior study. Negative for acute infarct, hemorrhage, mass Vascular: Negative for hyperdense vessel Skull: Negative Sinuses/Orbits: Paranasal sinuses clear.  Negative orbit Other: None IMPRESSION: No acute intracranial abnormality Mild atrophy and chronic microvascular ischemic change in the white matter. Electronically Signed   By:  Franchot Gallo M.D.   On: 08/03/2021 12:12   MR BRAIN WO CONTRAST  Result Date: 08/03/2021 CLINICAL DATA:  Acute neuro deficit. Bilateral leg weakness. Right facial droop EXAM: MRI HEAD WITHOUT CONTRAST TECHNIQUE: Multiplanar, multiecho pulse sequences of the brain and surrounding structures were obtained without intravenous contrast. COMPARISON:  MRI head 10/23/2013.  CT head 08/03/2021 FINDINGS: Brain: Negative for acute infarct. Chronic infarct posterior limb internal capsule on the left. Mild chronic microvascular ischemic change in the white matter. Ventricle size normal. Negative for hemorrhage or mass. Vascular: Normal arterial flow voids Skull and upper cervical spine: No focal skeletal abnormality Sinuses/Orbits: Negative Other: None IMPRESSION: Negative for acute infarct.  Mild chronic ischemic changes. Electronically Signed   By: Franchot Gallo M.D.   On: 08/03/2021 14:22   DG Chest Port 1 View  Result Date: 08/03/2021 CLINICAL DATA:  Difficulty breathing EXAM: PORTABLE CHEST 1 VIEW COMPARISON:  Previous studies including the examination of 04/07/2021 FINDINGS: Transverse diameter of heart is increased. Coronary artery calcifications are seen. There is decreased volume in the left lung. Increased density is seen in the left mid and left lower lung fields. There is blunting of left lateral CP angle. There is interval improvement in aeration of right mid and right lower lung fields. Right lateral CP angle is clear. There is no pneumothorax. IMPRESSION: Cardiomegaly. There is decreased volume in the left lung. There is increased density in the left mid and left lower lung fields. Findings suggest worsening of atelectasis/pneumonia in the left lung along with increase in left pleural effusion. There is interval clearing of right pleural effusion and improvement in aeration of right mid and right lower lung fields. Electronically Signed   By: Elmer Picker M.D.   On: 08/03/2021 16:25    ROS:  As per HPI otherwise negative.   Physical Exam: Vitals:   08/03/21 1530 08/03/21 1545 08/03/21 1600 08/03/21 1630  BP: (!) 160/66 (!) 164/57 (!) 168/72 (!) 177/64  Pulse: 64 64 65 65  Resp: 20 15 (!) 21 18  Temp:      TempSrc:      SpO2: 97% 97% 98% 99%     General: Chronically ill female who looks much older than stated age in no acute distress. Head: Normocephalic, atraumatic, sclera non-icteric, mucus membranes are moist Neck: Supple. JVD not elevated. Lungs: Clear bilaterally to auscultation without wheezes, rales, or rhonchi. Decreased in bases. On BIPAP, RR 18-20s.  Heart: RRR with S1 S2. No murmurs, rubs, or gallops appreciated. Abdomen: Soft, non-tender, non-distended with normoactive bowel sounds. No rebound/guarding. No obvious abdominal masses. M-S:  Strength and tone appear normal for age. Lower extremities:without edema or ischemic changes, no open wounds  Neuro: Alert and oriented X 3. Moves all extremities spontaneously. Psych:  Responds to questions appropriately with a normal affect. Dialysis Access: L AVF + T/B  Dialysis Orders:[facility] M,W,F,S 3 hrs 180NRe 400/600 74 kg 3.0 K/2.5 Ca UFP 2 AVF -Heparin 5000 units IV TIW -Mircera 200 mcg IV Q 2 weeks (last dose 07/25/2021) -Venofer 50 mg IV  q weekly -Hectorol 1 mcg IV TIW   Assessment/Plan:  Acute on chronic respiratory failure: On BIPAP. Mostly likely related to volume overload in setting of missed HD, not running her usual HD schedule. Serial HD for volume removal.   R sided Weakness: MRI negative for acute infarct. Per primary  HFrEF/G2DD. Have been managing on HD but usual schedule disrupted this week. Will have serial HD here, resume OP HD schedule upon discharge.   ESRD -  MWFS-HD tonight and again 08/04/2021. 3.0 K bath, usual heparin.   Hypertension/volume  - BP elevated but uses midodrine 10 mg PO prior to HD. She starts treatment hypertensive but BP falls rapidly. Continue losartan 50 mg PO daily and  carvedilol 12.5 mg PO BID. UF as tolerated.   Anemia  - HGB 11.2. Recent Fe load at OP center. ESA due 08/08/2021.   Metabolic bone disease -  C Ca 9.2. Last OP PO4 14.6! Continue binders (increased calcium acetate to 3 capsules TID AC)  VDRA.   Nutrition - NPO at present. Renal/Carb mod diet when able to eat.   DMT2-per primary  H/O CVA   Rita H. Owens Shark, NP-C 08/03/2021, 4:57 PM  [facility] Bee[facility] [phone]  Patient seen and examined, agree with above note with above modifications. Well known to me from kidney center-  she is chronically ill and used to have many hospitalizations back to back but has made an effort to do a little better of late-  she runs 4 days a week-  but does truncate at times-  she was brought to the ER for concern of CVA-  does not seem to have focal exam but is found to be hypoxic so needs to have HD tonight.  Best case scenario she could leave tomorrow and go to her OP unit  Corliss Parish, MD 08/03/2021       .

## 2021-08-03 NOTE — ED Notes (Signed)
RT at bedside to place pt on BiPAP

## 2021-08-03 NOTE — ED Notes (Signed)
Consent given by pt in written form to receive dialysis here at [facility], witnessed by this RN.

## 2021-08-03 NOTE — ED Notes (Signed)
Pt assisted to bedside commode by this RN  

## 2021-08-03 NOTE — ED Notes (Signed)
Patient transported to MRI 

## 2021-08-04 ENCOUNTER — Observation Stay (HOSPITAL_COMMUNITY): Payer: Medicare Other

## 2021-08-04 ENCOUNTER — Encounter (HOSPITAL_COMMUNITY): Payer: Self-pay | Admitting: Family Medicine

## 2021-08-04 DIAGNOSIS — J9621 Acute and chronic respiratory failure with hypoxia: Secondary | ICD-10-CM | POA: Diagnosis not present

## 2021-08-04 LAB — BASIC METABOLIC PANEL
Anion gap: 11 (ref 5–15)
BUN: 45 mg/dL — ABNORMAL HIGH (ref 8–23)
CO2: 26 mmol/L (ref 22–32)
Calcium: 8.3 mg/dL — ABNORMAL LOW (ref 8.9–10.3)
Chloride: 101 mmol/L (ref 98–111)
Creatinine, Ser: 5.86 mg/dL — ABNORMAL HIGH (ref 0.44–1.00)
GFR, Estimated: 8 mL/min — ABNORMAL LOW (ref >60)
Glucose, Bld: 159 mg/dL — ABNORMAL HIGH (ref 70–99)
Potassium: 3.2 mmol/L — ABNORMAL LOW (ref 3.5–5.1)
Sodium: 138 mmol/L (ref 135–145)

## 2021-08-04 LAB — CBC
HCT: 34.7 % — ABNORMAL LOW (ref 36.0–46.0)
Hemoglobin: 10.1 g/dL — ABNORMAL LOW (ref 12.0–15.0)
MCH: 26.5 pg (ref 26.0–34.0)
MCHC: 29.1 g/dL — ABNORMAL LOW (ref 30.0–36.0)
MCV: 91.1 fL (ref 80.0–100.0)
Platelets: 243 10*3/uL (ref 150–400)
RBC: 3.81 MIL/uL — ABNORMAL LOW (ref 3.87–5.11)
RDW: 17.4 % — ABNORMAL HIGH (ref 11.5–15.5)
WBC: 8.4 10*3/uL (ref 4.0–10.5)
nRBC: 0 % (ref 0.0–0.2)

## 2021-08-04 LAB — GLUCOSE, CAPILLARY: Glucose-Capillary: 108 mg/dL — ABNORMAL HIGH (ref 70–99)

## 2021-08-04 LAB — MRSA NEXT GEN BY PCR, NASAL: MRSA by PCR Next Gen: NOT DETECTED

## 2021-08-04 MED ORDER — CLOPIDOGREL BISULFATE 75 MG PO TABS
75.0000 mg | ORAL_TABLET | Freq: Every day | ORAL | 2 refills | Status: DC
  Filled 2021-08-04: qty 30, 30d supply, fill #0

## 2021-08-04 MED ORDER — ENSURE ENLIVE PO LIQD: 237.0000 mL | Freq: Two times a day (BID) | ORAL | Status: DC

## 2021-08-04 MED ORDER — ASPIRIN 325 MG PO TABS
325.0000 mg | ORAL_TABLET | Freq: Every evening | ORAL | 0 refills | Status: AC
Start: 2021-08-04 — End: 2021-08-06
  Filled 2021-08-04: qty 2, 2d supply, fill #0

## 2021-08-04 NOTE — TOC CM/SW Note (Signed)
Noted dc ordered. Spoke with daughter who states [name] has portable oxygen to go home. No further needs identified.    Tina Rolling, MSN, RN,BSN Inpatient [facility] Case Manager [phone]

## 2021-08-04 NOTE — Evaluation (Signed)
Physical Therapy Evaluation Patient Details Name: Tina Gomez MRN: [id number] DOB: 1956/01/03 Today's Date: 08/04/2021  History of Present Illness  65yo female who presented on 11/25 with R sided weakness, R facial droop, slurred speech, also had a fall OOB onto her R side as well. CTH and MRI negative for acute processes. Found to be hypoxic on 4LPM O2 in ED. Admitted with acute on chronic respiratory failure with hypoxia. PMH CKD, COPD, DM, HLD, HTN, CVA  Clinical Impression   Patient received in bed, pleasant and cooperative; daughter present and supportive/helped to encourage patient to participate in therapy today. Generally able to mobilize on a Mod(I) to supervision level with RW; did need VC for hand placement/safety/sequencing with transfers, but otherwise safe and steady. Able to toilet/perform pericare and change out brief with distant S/no difficulty. Left sitting at EOB with all needs met, bed alarm active and family present. Seems to be at her baseline per her and family's report- no skilled PT services needed at this time, also per MD discharging today so we will sign off for now. Thank you for the opportunity to participate in her care!        Recommendations for follow up therapy are one component of a multi-disciplinary discharge planning process, led by the attending physician.  Recommendations may be updated based on patient status, additional functional criteria and insurance authorization.  Follow Up Recommendations No PT follow up    Assistance Recommended at Discharge Frequent or constant Supervision/Assistance  Functional Status Assessment Patient has not had a recent decline in their functional status  Equipment Recommendations  None recommended by PT (well equipped)    Recommendations for Other Services       Precautions / Restrictions Precautions Precautions: Fall Restrictions Weight Bearing Restrictions: No      Mobility  Bed Mobility Overal bed  mobility: Modified Independent             General bed mobility comments: HOB elevated, extra time and effort    Transfers Overall transfer level: Needs assistance Equipment used: Rolling walker (2 wheels) Transfers: Sit to/from Stand Sit to Stand: Supervision           General transfer comment: S for safety, cues for hand placement- tends to pull up from RW and "plop" down onto bed without reaching back for surface    Ambulation/Gait Ambulation/Gait assistance: Supervision Gait Distance (Feet): 50 Feet (66ft, then 84ftx2 to/from bathroom) Assistive device: Rolling walker (2 wheels) Gait Pattern/deviations: Step-through pattern;Trunk flexed Gait velocity: decreased     General Gait Details: steady with RW, reports she uses rollator at home instead; gait speed decreased and easily fatigued  Stairs            Wheelchair Mobility    Modified Rankin (Stroke Patients Only)       Balance Overall balance assessment: Mild deficits observed, not formally tested                                           Pertinent Vitals/Pain Pain Assessment: No/denies pain    Home Living Family/patient expects to be discharged to:: Private residence   Available Help at Discharge: Family;Available 24 hours/day Type of Home: House Home Access: Ramped entrance     Alternate Level Stairs-Number of Steps: has "mother in law suite" which is handicapped equipped, does not need to go up stairs inside Home Layout:  Two level;Able to live on main level with bedroom/bathroom Home Equipment: Shower seat;Grab bars - tub/shower;BSC/3in1;Grab bars - toilet;Rollator (4 wheels);Wheelchair - manual;Hand held shower head Additional Comments: wiped out on HD days and uses walker on those days, on non-HD days does walk in house without device; normally able to walk just household distances, will use scooter in the grocery store/community. Gets winded easily. No falls recently. No  O2 at home.    Prior Function Prior Level of Function : Independent/Modified Independent                     Hand Dominance   Dominant Hand: Right    Extremity/Trunk Assessment   Upper Extremity Assessment Upper Extremity Assessment: Defer to OT evaluation    Lower Extremity Assessment Lower Extremity Assessment: Overall WFL for tasks assessed    Cervical / Trunk Assessment Cervical / Trunk Assessment: Normal  Communication   Communication: No difficulties  Cognition Arousal/Alertness: Awake/alert Behavior During Therapy: WFL for tasks assessed/performed Overall Cognitive Status: Within Functional Limits for tasks assessed                                          General Comments General comments (skin integrity, edema, etc.): VSS on 4LPM O2 (baseline flow)    Exercises     Assessment/Plan    PT Assessment Patient does not need any further PT services  PT Problem List         PT Treatment Interventions      PT Goals (Current goals can be found in the Care Plan section)  Acute Rehab PT Goals Patient Stated Goal: go home today PT Goal Formulation: With patient/family Time For Goal Achievement: 08/18/21 Potential to Achieve Goals: Good    Frequency     Barriers to discharge        Co-evaluation               AM-PAC PT "6 Clicks" Mobility  Outcome Measure Help needed turning from your back to your side while in a flat bed without using bedrails?: None Help needed moving from lying on your back to sitting on the side of a flat bed without using bedrails?: A Little Help needed moving to and from a bed to a chair (including a wheelchair)?: A Little Help needed standing up from a chair using your arms (e.g., wheelchair or bedside chair)?: A Little Help needed to walk in hospital room?: A Little Help needed climbing 3-5 steps with a railing? : A Lot 6 Click Score: 18    End of Session Equipment Utilized During Treatment: Gait  belt;Oxygen Activity Tolerance: Patient tolerated treatment well Patient left: in bed;with call bell/phone within reach;with bed alarm set (sitting at EOB per her request) Nurse Communication: Mobility status PT Visit Diagnosis: Muscle weakness (generalized) (M62.81)    Time: 0240-9735 PT Time Calculation (min) (ACUTE ONLY): 23 min   Charges:   PT Evaluation $PT Eval Moderate Complexity: 1 Mod PT Treatments $Gait Training: 8-22 mins       Windell Norfolk, DPT, PN2   Supplemental Physical Therapist [place]    Pager [phone] Acute Rehab Office [phone]

## 2021-08-04 NOTE — Plan of Care (Signed)
  Problem: Education: Goal: Knowledge of General Education information will improve Description: Including pain rating scale, medication(s)/side effects and non-pharmacologic comfort measures Outcome: Progressing   Problem: Health Behavior/Discharge Planning: Goal: Ability to manage health-related needs will improve Outcome: Progressing   Problem: Clinical Measurements: Goal: Ability to maintain clinical measurements within normal limits will improve Outcome: Progressing   Problem: Activity: Goal: Risk for activity intolerance will decrease Outcome: Progressing   Problem: Coping: Goal: Level of anxiety will decrease Outcome: Progressing   Problem: Elimination: Goal: Will not experience complications related to bowel motility Outcome: Progressing   Problem: Pain Managment: Goal: General experience of comfort will improve Outcome: Progressing   Problem: Safety: Goal: Ability to remain free from injury will improve Outcome: Progressing   

## 2021-08-04 NOTE — Progress Notes (Signed)
Pt wheeled off unit with all belongings. CCMD notified of DC and both Iv's were removed. All belongings were took by pt's daughter.

## 2021-08-04 NOTE — Discharge Summary (Signed)
[facility]  Physician Discharge Summary   Patient ID: [name] MRN: [id number] DOB/AGE: 03-13-1956 65 y.o.  Admit date: 08/03/2021 Discharge date: 08/04/2021    PCP: Charlott Rakes, MD  DISCHARGE DIAGNOSES:  Acute on chronic respiratory failure with hypoxia End-stage renal disease on hemodialysis with noncompliance Right-sided focal deficit, possible TIA versus secondary to uremia COPD On home oxygen Previous history of stroke Benign essential hypertension   RECOMMENDATIONS FOR OUTPATIENT FOLLOW UP: Ambulatory order has been placed for carotid Doppler as well as echocardiogram Patient to report for dialysis after discharge today and maintain her usual schedule. Will need to repeat chest x-ray in 1 week, to be facilitated by PCP Check lipid panel at follow-up    Home Health: None Equipment/Devices: None  CODE STATUS: Full code  DISCHARGE CONDITION: fair  Diet recommendation: As before  INITIAL HISTORY: Tina Gomez is a 65 y.o. female with medical history significant of T2DM, ESRD on HD MWFS, COPD with chronic hypoxia on 4L [place], HTN, HLD, RLS, CHF and CAD, anxiety and depression and hx of previous CVA who presented to Ed with complaints of right sided weakness, slurred speech and right sided facial droop. She was last known well last night at about 10:00pm. She tells me her electric blanket turned off during the night and when she went to reach for it she fell out of the bed and hit her right hand and arm. Her daughter helped her back in bed and she went to sleep. When she woke up this AM around 7:00AM she had noticeable right sided weakness and was unable to get to bathroom and called her daughter. Her daughter states she didn't really notice right sided weakness, she was globally weak like a "wet noodle." She didn't want to come to hospital. Daughter also noticed a possible right sided facial droop and slurred speech, no confusion per patient and her sister,  but daughter states she had some mild confusion when she got to hospital today that improved and she is back to baseline.    She missed dialysis on Wednesday and went today but they sent her to ER and she missed her session.    She denies any fever, chills, headaches, chest pain or palpitations, shortness of breath or cough, stomach pain, N/V/D, leg swelling, dysuria. No sick contacts.      ED Course: vitals: afebrile, bp: 193/67, HR: 70, RR: 21, oxygen 98% 3L [place]-->86% on 5L changed to bipap.  Pertinent labs: hgb: 11.2, CO2: 19, BUN: 95, creatinine: 9.30, AG: 20, covid/flu negative, venous gas pH: 7.361, pCO2: 42, o2: 67  CXR: cardiomegaly. Decreased volume in left lung. Increased dentisy in left mid and left lower lung fields. Worsening atelectasis/pneumonia in left lung along with increase in left pleural effusion.  CTH: no acute intracranial abnormality  MRI brain: negative for acute infarct.  In ED: w/u for stroke, labs. Nephrology consulted and [facility] was asked to admit.  Consultations: Nephrology  Procedures: Hemodialysis   HOSPITAL COURSE:   Acute on chronic respiratory failure with hypoxia 65 year old female with ESRD on HD and CHF with EF of 30% and COPD on 4L  presenting with complaints of right sided weakness but had hypoxia to 86% on 5L oxygen.  Required BiPAP.  Chest x-ray showed significant opacity in the left lung.  Differential diagnosis included infectious process versus fluid overload.  Patient however has had no fever or chills or cough.  Infection was thought to be less likely.  Patient was  dialyzed with improvement in her symptoms.  She was ambulated with physical therapy and she did well on her usual oxygen.  Chest x-ray done this morning has not shown much improvement.  Would recommend that x-ray be repeated in 1 week.  Since she is otherwise stable it was felt that she was okay for discharge.  Patient also wanted to go home today as she noted that she was back to  baseline.  This was also discussed with patient's daughter and with nephrology.  ESRD on hemodialysis Usually dialyzes MWF and Saturday and has missed 2 sessions.  Patient was dialyzed.  Since she has improved she will go for her usual dialysis to her outpatient center.  Metabolic acidosis Resolved   Acute weakness/facial droop/concern for TIA Mention right-sided facial droop along with right-sided weakness.  Was not appreciated subsequently.  MRI was negative for stroke.  Symptoms could have been due to uremia from having missed dialysis sessions.  However TIA cannot be ruled out.  I offered patient carotid Doppler and echocardiogram in the hospital but she declines.  She would like to get these in the outpatient setting.  She is on aspirin daily.  Changed to Plavix.  Continue with statin.  To have lipid panel checked in the outpatient setting.   Type 2 diabetes mellitus with diabetic neuropathy, unspecified -last a1c 7/22: 6.2, diet controlled -continue gabapentin for neuropathy   COPD on chronic oxygen 4L  Continue home inhalers, no exacerbation at time of admit  Requiring bipap due to volume overload in setting of missed dialysis.  Was subsequently weaned off of it and placed on her usual oxygen by nasal cannula.   History of CVA (cerebrovascular accident) See above   Essential hypertension, benign Normocytic Anemia  CAD/Hyperlipidemia Cardiac cath 11/2020: 70% proximal right coronary artery  followed by 50% with otherwise non obstructive disease.    Anxiety and depression Continue prozac and hydroxyzine prn    Chronic systolic CHF (congestive heart failure)   Last echo: EF of 30%. Left ventricle has moderate to severely decreased fx. Global hypokinesis. Grade II diastolic dysfunction  -fluid overloaded in setting of missed dialysis sessions.   Obesity Estimated body mass index is 31.13 kg/m as calculated from the following:   Height as of this encounter: 5' 2"  (1.575 m).    Weight as of this encounter: 77.2 kg.  Patient is stable.  Feels much better.  Ambulated with physical therapy.  Wants to go home today.  Okay for discharge.   PERTINENT LABS:  The results of significant diagnostics from this hospitalization (including imaging, microbiology, ancillary and laboratory) are listed below for reference.    Microbiology: Recent Results (from the past 240 hour(s))  Resp Panel by RT-PCR (Flu A&B, Covid) Nasopharyngeal Swab     Status: None   Collection Time: 08/03/21 11:51 AM   Specimen: Nasopharyngeal Swab; Nasopharyngeal(NP) swabs in vial transport medium  Result Value Ref Range Status   SARS Coronavirus 2 by RT PCR NEGATIVE NEGATIVE Final    Comment: (NOTE) SARS-CoV-2 target nucleic acids are NOT DETECTED.  The SARS-CoV-2 RNA is generally detectable in upper respiratory specimens during the acute phase of infection. The lowest concentration of SARS-CoV-2 viral copies this assay can detect is 138 copies/mL. A negative result does not preclude SARS-Cov-2 infection and should not be used as the sole basis for treatment or other patient management decisions. A negative result may occur with  improper specimen collection/handling, submission of specimen other than nasopharyngeal swab, presence of  viral mutation(s) within the areas targeted by this assay, and inadequate number of viral copies(<138 copies/mL). A negative result must be combined with clinical observations, patient history, and epidemiological information. The expected result is Negative.  Fact Sheet for Patients:  EntrepreneurPulse.com.au  Fact Sheet for Healthcare Providers:  IncredibleEmployment.be  This test is no t yet approved or cleared by the [place] FDA and  has been authorized for detection and/or diagnosis of SARS-CoV-2 by FDA under an Emergency Use Authorization (EUA). This EUA will remain  in effect (meaning this test can be used) for  the duration of the COVID-19 declaration under Section 564(b)(1) of the Act, 21 U.S.C.section 360bbb-3(b)(1), unless the authorization is terminated  or revoked sooner.       Influenza A by PCR NEGATIVE NEGATIVE Final   Influenza B by PCR NEGATIVE NEGATIVE Final    Comment: (NOTE) The Xpert Xpress SARS-CoV-2/FLU/RSV plus assay is intended as an aid in the diagnosis of influenza from Nasopharyngeal swab specimens and should not be used as a sole basis for treatment. Nasal washings and aspirates are unacceptable for Xpert Xpress SARS-CoV-2/FLU/RSV testing.  Fact Sheet for Patients: EntrepreneurPulse.com.au  Fact Sheet for Healthcare Providers: IncredibleEmployment.be  This test is not yet approved or cleared by the [place] FDA and has been authorized for detection and/or diagnosis of SARS-CoV-2 by FDA under an Emergency Use Authorization (EUA). This EUA will remain in effect (meaning this test can be used) for the duration of the COVID-19 declaration under Section 564(b)(1) of the Act, 21 U.S.C. section 360bbb-3(b)(1), unless the authorization is terminated or revoked.  Performed at [facility], [place] [address], [place], [place] [address]   MRSA Next Gen by PCR, Nasal     Status: None   Collection Time: 08/03/21 11:29 PM   Specimen: Nasal Mucosa; Nasal Swab  Result Value Ref Range Status   MRSA by PCR Next Gen NOT DETECTED NOT DETECTED Final    Comment: (NOTE) The GeneXpert MRSA Assay (FDA approved for NASAL specimens only), is one component of a comprehensive MRSA colonization surveillance program. It is not intended to diagnose MRSA infection nor to guide or monitor treatment for MRSA infections. Test performance is not FDA approved in patients less than 37 years old. Performed at [facility], [place] [address], [place], [place] [address]      Labs:  COVID-19 Labs  No results for input(s): DDIMER, FERRITIN, LDH,  CRP in the last 72 hours.  Lab Results  Component Value Date   SARSCOV2NAA NEGATIVE 08/03/2021   SARSCOV2NAA POSITIVE (A) 04/04/2021   [place] NEGATIVE 12/31/2020   [place] NEGATIVE 12/13/2020      Basic Metabolic Panel: Recent Labs  Lab 08/03/21 1103 08/03/21 1523 08/04/21 0225  NA 136 137 138  K 4.2 3.5 3.2*  CL 97*  --  101  CO2 19*  --  26  GLUCOSE 150*  --  159*  BUN 95*  --  45*  CREATININE 9.30*  --  5.86*  CALCIUM 8.2*  --  8.3*   Liver Function Tests: Recent Labs  Lab 08/03/21 1103  AST 29  ALT 14  ALKPHOS 63  BILITOT 0.7  PROT 6.5  ALBUMIN 2.7*    CBC: Recent Labs  Lab 08/03/21 1103 08/03/21 1523 08/04/21 0225  WBC 10.4  --  8.4  NEUTROABS 7.2  --   --   HGB 11.2* 11.6* 10.1*  HCT 37.8 34.0* 34.7*  MCV 92.2  --  91.1  PLT 287  --  243    BNP: BNP (last 3 results) Recent Labs    04/09/21 0341 04/10/21 0424 08/03/21 2248  BNP 4,358.2* 2,681.5* 1,684.3*     CBG: Recent Labs  Lab 08/03/21 2322 08/04/21 0640  GLUCAP 179* 108*     IMAGING STUDIES DG Chest 2 View  Result Date: 08/04/2021 CLINICAL DATA:  Weakness and shortness of breath. EXAM: CHEST - 2 VIEW COMPARISON:  08/03/2021 and older studies. FINDINGS: Cardiac silhouette is enlarged. Airspace opacification extends from the left mid lung to the lung base obscuring left heart border and left hemidiaphragm, without significant change. Bilateral interstitial thickening is also stable from the previous day's exam. No new lung abnormalities. No definite pleural effusion.  No evidence of a pneumothorax. Skeletal structures are intact. IMPRESSION: 1. No significant change from the previous day's study. 2. Left mid to lower lung zone consolidation consistent with pneumonia and/or atelectasis. 3. Diffuse bilateral interstitial thickening is similar to multiple older exams, presumed chronic. Superimposed interstitial edema is not excluded. 4. No convincing pleural effusion the  lateral view. Electronically Signed   By: Lajean Manes M.D.   On: 08/04/2021 09:53   CT HEAD WO CONTRAST  Result Date: 08/03/2021 CLINICAL DATA:  Acute neuro deficit. Right-sided weakness and numbness. EXAM: CT HEAD WITHOUT CONTRAST TECHNIQUE: Contiguous axial images were obtained from the base of the skull through the vertex without intravenous contrast. COMPARISON:  CT head 05/24/2020 FINDINGS: Brain: Mild atrophy. Patchy white matter hypodensity bilaterally similar to the prior study. Negative for acute infarct, hemorrhage, mass Vascular: Negative for hyperdense vessel Skull: Negative Sinuses/Orbits: Paranasal sinuses clear.  Negative orbit Other: None IMPRESSION: No acute intracranial abnormality Mild atrophy and chronic microvascular ischemic change in the white matter. Electronically Signed   By: Franchot Gallo M.D.   On: 08/03/2021 12:12   MR BRAIN WO CONTRAST  Result Date: 08/03/2021 CLINICAL DATA:  Acute neuro deficit. Bilateral leg weakness. Right facial droop EXAM: MRI HEAD WITHOUT CONTRAST TECHNIQUE: Multiplanar, multiecho pulse sequences of the brain and surrounding structures were obtained without intravenous contrast. COMPARISON:  MRI head 10/23/2013.  CT head 08/03/2021 FINDINGS: Brain: Negative for acute infarct. Chronic infarct posterior limb internal capsule on the left. Mild chronic microvascular ischemic change in the white matter. Ventricle size normal. Negative for hemorrhage or mass. Vascular: Normal arterial flow voids Skull and upper cervical spine: No focal skeletal abnormality Sinuses/Orbits: Negative Other: None IMPRESSION: Negative for acute infarct.  Mild chronic ischemic changes. Electronically Signed   By: Franchot Gallo M.D.   On: 08/03/2021 14:22   DG Chest Port 1 View  Result Date: 08/03/2021 CLINICAL DATA:  Difficulty breathing EXAM: PORTABLE CHEST 1 VIEW COMPARISON:  Previous studies including the examination of 04/07/2021 FINDINGS: Transverse diameter of heart  is increased. Coronary artery calcifications are seen. There is decreased volume in the left lung. Increased density is seen in the left mid and left lower lung fields. There is blunting of left lateral CP angle. There is interval improvement in aeration of right mid and right lower lung fields. Right lateral CP angle is clear. There is no pneumothorax. IMPRESSION: Cardiomegaly. There is decreased volume in the left lung. There is increased density in the left mid and left lower lung fields. Findings suggest worsening of atelectasis/pneumonia in the left lung along with increase in left pleural effusion. There is interval clearing of right pleural effusion and improvement in aeration of right mid and right lower lung fields. Electronically Signed  By: Elmer Picker M.D.   On: 08/03/2021 16:25    DISCHARGE EXAMINATION: Vitals:   08/03/21 2318 08/04/21 0323 08/04/21 0401 08/04/21 0827  BP:  (!) 157/51  (!) 177/65  Pulse:  64  63  Resp:  18  17  Temp:  (!) 97.5 F (36.4 C)  (!) 97.4 F (36.3 C)  TempSrc:  Oral  Oral  SpO2:  99%  99%  Weight: 77.7 kg  77.2 kg   Height: 5' 2"  (1.575 m)      General appearance: Awake alert.  In no distress Resp: Clear to auscultation bilaterally.  Normal effort Cardio: S1-S2 is normal regular.  No S3-S4.  No rubs murmurs or bruit GI: Abdomen is soft.  Nontender nondistended.  Bowel sounds are present normal.  No masses organomegaly Extremities: No edema.  Full range of motion of lower extremities. Neurologic: Alert and oriented x3.  No facial asymmetry.  No pronator drift.  Motor strength equal bilateral upper and lower extremities.   DISPOSITION: Home with daughter  Discharge Instructions     Call MD for:  difficulty breathing, headache or visual disturbances   Complete by: As directed    Call MD for:  extreme fatigue   Complete by: As directed    Call MD for:  persistant dizziness or light-headedness   Complete by: As directed    Call MD for:   persistant nausea and vomiting   Complete by: As directed    Call MD for:  severe uncontrolled pain   Complete by: As directed    Call MD for:  temperature >100.4   Complete by: As directed    Diet - low sodium heart healthy   Complete by: As directed    Discharge instructions   Complete by: As directed    Please start taking Plavix from 11/28.  Stop aspirin after tomorrow's dose.  Continue to take your cholesterol medicine.  Referral has been sent for Doppler ultrasound of your carotid arteries as well as an echocardiogram.  Please see your primary care provider in 1 week.  Please seek attention immediately if your right-sided weakness recurs.  You were cared for by a hospitalist during your hospital stay. If you have any questions about your discharge medications or the care you received while you were in the hospital after you are discharged, you can call the unit and asked to speak with the hospitalist on call if the hospitalist that took care of you is not available. Once you are discharged, your primary care physician will handle any further medical issues. Please note that NO REFILLS for any discharge medications will be authorized once you are discharged, as it is imperative that you return to your primary care physician (or establish a relationship with a primary care physician if you do not have one) for your aftercare needs so that they can reassess your need for medications and monitor your lab values. If you do not have a primary care physician, you can call [phone] for a physician referral.   Increase activity slowly   Complete by: As directed    VAS US CAROTID   Complete by: Aug 11, 2021 ASAP    Laterality: Bilateral   Reason for exam: TIA G45.9   Where should this test be performed: [facility]         Allergies as of 08/04/2021       Reactions   Codeine Nausea And Vomiting   Adhesive [tape] Other (See  Comments)   Tape breaks out the skin if  it is left on for a lengthy period of time        Medication List     TAKE these medications    acetaminophen 500 MG tablet Commonly known as: TYLENOL Take 1,000 mg by mouth every 6 (six) hours as needed for mild pain or headache.   albuterol 108 (90 Base) MCG/ACT inhaler Commonly known as: Ventolin HFA Inhale 2 puffs into the lungs every 6 (six) hours as needed for wheezing or shortness of breath.   albuterol (2.5 MG/3ML) 0.083% nebulizer solution Commonly known as: PROVENTIL Take 3 mLs (2.5 mg total) by nebulization every 6 (six) hours as needed for wheezing or shortness of breath.   aspirin 325 MG tablet Take 1 tablet (325 mg total) by mouth every evening for 2 days.   atorvastatin 40 MG tablet Commonly known as: LIPITOR Take 1 tablet (40 mg total) by mouth daily at 6 PM.   blood glucose meter kit and supplies Use as instructed   True Metrix Meter Devi 1 each by Does not apply route 3 (three) times daily before meals.   calcium acetate 667 MG capsule Commonly known as: PHOSLO Take 667-1,334 mg by mouth See admin instructions. Take 1,334 mg by mouth three times a day before meals and 667 mg before any snack   carvedilol 12.5 MG tablet Commonly known as: COREG Take 1 tablet (12.5 mg total) by mouth 2 (two) times daily with a meal. What changed: when to take this   clopidogrel 75 MG tablet Commonly known as: Plavix Take 1 tablet (75 mg total) by mouth daily. Start taking on: August 06, 2021   DIALYVITE 800 WITH ZINC 0.8 MG Tabs Take 1 tablet by mouth at bedtime.   dicyclomine 10 MG capsule Commonly known as: Bentyl Take 1 capsule (10 mg total) by mouth 2 (two) times daily as needed for spasms.   FLUoxetine 20 MG capsule Commonly known as: PROzac Take 1 capsule (20 mg total) by mouth daily. What changed: when to take this   freestyle lancets Use as instructed   TRUEplus Lancets 28G Misc 1 each by Does not apply route 3 (three) times daily before  meals.   gabapentin 100 MG capsule Commonly known as: NEURONTIN Take 1 capsule (100 mg total) by mouth at bedtime.   hydrOXYzine 25 MG tablet Commonly known as: ATARAX/VISTARIL Take 1 tablet (25 mg total) by mouth 3 (three) times daily as needed for anxiety.   lidocaine-prilocaine cream Commonly known as: EMLA Apply 1 application topically See admin instructions. For use on dialysis days- fistula (left arm)   loperamide 2 MG tablet Commonly known as: IMODIUM A-D Take 2 mg by mouth 4 (four) times daily as needed for diarrhea or loose stools.   losartan 100 MG tablet Commonly known as: COZAAR Take 1 tablet (100 mg total) by mouth every evening. What changed:  when to take this additional instructions   midodrine 10 MG tablet Commonly known as: PROAMATINE Take 1 tablet (10 mg total) by mouth as directed prior to dialysis. What changed: Another medication with the same name was removed. Continue taking this medication, and follow the directions you see here.   Misc. Devices Misc Large depends. Diagnosis - fecal incontinence   ondansetron 4 MG tablet Commonly known as: Zofran Take 1 tablet (4 mg total) by mouth every 8 (eight) hours as needed for nausea or vomiting.   OXYGEN Inhale 4 L/min into the lungs  continuous.   Spiriva Respimat 2.5 MCG/ACT Aers Generic drug: Tiotropium Bromide Monohydrate Inhale 2 puffs into the lungs daily.   TRUEtest Test test strip Generic drug: glucose blood Use as instructed          Follow-up Information     Charlott Rakes, MD. Schedule an appointment as soon as possible for a visit in 1 week(s).   Specialty: Family Medicine Contact information: [place] [address] [phone]                 TOTAL DISCHARGE TIME: 56 minutes  [place]  [facility] Pager on [email or web address]  08/05/2021, 10:50 AM

## 2021-08-04 NOTE — Progress Notes (Signed)
OT Cancellation Note  Patient Details Name: Tina Gomez MRN: [id number] DOB: Oct 01, 1955   Cancelled Treatment:    Reason Eval/Treat Not Completed: OT screened, no needs identified, will sign off.  Spoke with pt and daughter who both report pt is back to baseline, and they don't feel she needs OT services at this time.    Nilsa Nutting., OTR/L Acute Rehabilitation Services Pager [phone] Office [phone]   Lucille Passy M 08/04/2021, 10:43 AM

## 2021-08-04 NOTE — Progress Notes (Signed)
Subjective:  feels better-  daughter concurs-  is prepared to go to OP HD today -  her time is 10 AM but I bet they could take her a little later as she only runs 3 hours Objective Vital signs in last 24 hours: Vitals:   08/03/21 2318 08/04/21 0323 08/04/21 0401 08/04/21 0827  BP:  (!) 157/51  (!) 177/65  Pulse:  64  63  Resp:  18  17  Temp:  (!) 97.5 F (36.4 C)  (!) 97.4 F (36.3 C)  TempSrc:  Oral  Oral  SpO2:  99%  99%  Weight: 77.7 kg  77.2 kg   Height: 5\' 2"  (1.575 m)      Weight change:   Intake/Output Summary (Last 24 hours) at 08/04/2021 0844 Last data filed at 08/03/2021 2048 Gross per 24 hour  Intake --  Output 2380 ml  Net -2380 ml   Dialysis Orders:[facility] M,W,F,S 3 hrs 180NRe 400/600 74 kg 3.0 K/2.5 Ca UFP 2 AVF -Heparin 5000 units IV TIW -Mircera 200 mcg IV Q 2 weeks (last dose 07/25/2021) -Venofer 50 mg IV q weekly -Hectorol 1 mcg IV TIW     Assessment/Plan:  Acute on chronic respiratory failure: On BIPAP. Mostly likely related to volume overload in setting of missed HD, not running her usual HD schedule. Serial HD for volume removal.  Had last night and is much better-  could go to OP HD today in my opinion R sided Weakness: MRI negative for acute infarct. No facial droop or unilateral weakness noted today   HFrEF/G2DD. Have been managing on HD but usual schedule disrupted this week. Will have HD here, resume OP HD schedule upon discharge. They are expecting her at [facility] later today   ESRD -  MWFS-HD tonight and again 08/04/2021. 3.0 K bath, usual heparin.   Hypertension/volume  - BP elevated but uses midodrine 10 mg PO prior to HD. She starts treatment hypertensive but BP falls rapidly. Continue losartan 50 mg PO daily and carvedilol 12.5 mg PO BID. UF as tolerated.   Anemia  - HGB 11.2. Recent Fe load at OP center. ESA due 08/08/2021.   Metabolic bone disease -  C Ca 9.2. Last OP PO4 14.6! Continue binders (increased calcium acetate to 3 capsules TID AC)   VDRA.   Nutrition - NPO at present. Renal/Carb mod diet when able to eat.   DMT2-per primary  H/O CVA    Louis Meckel    Labs: Basic Metabolic Panel: Recent Labs  Lab 08/03/21 1103 08/03/21 1523 08/04/21 0225  NA 136 137 138  K 4.2 3.5 3.2*  CL 97*  --  101  CO2 19*  --  26  GLUCOSE 150*  --  159*  BUN 95*  --  45*  CREATININE 9.30*  --  5.86*  CALCIUM 8.2*  --  8.3*   Liver Function Tests: Recent Labs  Lab 08/03/21 1103  AST 29  ALT 14  ALKPHOS 63  BILITOT 0.7  PROT 6.5  ALBUMIN 2.7*   No results for input(s): LIPASE, AMYLASE in the last 168 hours. No results for input(s): AMMONIA in the last 168 hours. CBC: Recent Labs  Lab 08/03/21 1103 08/03/21 1523 08/04/21 0225  WBC 10.4  --  8.4  NEUTROABS 7.2  --   --   HGB 11.2* 11.6* 10.1*  HCT 37.8 34.0* 34.7*  MCV 92.2  --  91.1  PLT 287  --  243   Cardiac Enzymes:  No results for input(s): CKTOTAL, CKMB, CKMBINDEX, TROPONINI in the last 168 hours. CBG: Recent Labs  Lab 08/03/21 2322 08/04/21 0640  GLUCAP 179* 108*    Iron Studies: No results for input(s): IRON, TIBC, TRANSFERRIN, FERRITIN in the last 72 hours. Studies/Results: CT HEAD WO CONTRAST  Result Date: 08/03/2021 CLINICAL DATA:  Acute neuro deficit. Right-sided weakness and numbness. EXAM: CT HEAD WITHOUT CONTRAST TECHNIQUE: Contiguous axial images were obtained from the base of the skull through the vertex without intravenous contrast. COMPARISON:  CT head 05/24/2020 FINDINGS: Brain: Mild atrophy. Patchy white matter hypodensity bilaterally similar to the prior study. Negative for acute infarct, hemorrhage, mass Vascular: Negative for hyperdense vessel Skull: Negative Sinuses/Orbits: Paranasal sinuses clear.  Negative orbit Other: None IMPRESSION: No acute intracranial abnormality Mild atrophy and chronic microvascular ischemic change in the white matter. Electronically Signed   By: Franchot Gallo M.D.   On: 08/03/2021 12:12   MR BRAIN  WO CONTRAST  Result Date: 08/03/2021 CLINICAL DATA:  Acute neuro deficit. Bilateral leg weakness. Right facial droop EXAM: MRI HEAD WITHOUT CONTRAST TECHNIQUE: Multiplanar, multiecho pulse sequences of the brain and surrounding structures were obtained without intravenous contrast. COMPARISON:  MRI head 10/23/2013.  CT head 08/03/2021 FINDINGS: Brain: Negative for acute infarct. Chronic infarct posterior limb internal capsule on the left. Mild chronic microvascular ischemic change in the white matter. Ventricle size normal. Negative for hemorrhage or mass. Vascular: Normal arterial flow voids Skull and upper cervical spine: No focal skeletal abnormality Sinuses/Orbits: Negative Other: None IMPRESSION: Negative for acute infarct.  Mild chronic ischemic changes. Electronically Signed   By: Franchot Gallo M.D.   On: 08/03/2021 14:22   DG Chest Port 1 View  Result Date: 08/03/2021 CLINICAL DATA:  Difficulty breathing EXAM: PORTABLE CHEST 1 VIEW COMPARISON:  Previous studies including the examination of 04/07/2021 FINDINGS: Transverse diameter of heart is increased. Coronary artery calcifications are seen. There is decreased volume in the left lung. Increased density is seen in the left mid and left lower lung fields. There is blunting of left lateral CP angle. There is interval improvement in aeration of right mid and right lower lung fields. Right lateral CP angle is clear. There is no pneumothorax. IMPRESSION: Cardiomegaly. There is decreased volume in the left lung. There is increased density in the left mid and left lower lung fields. Findings suggest worsening of atelectasis/pneumonia in the left lung along with increase in left pleural effusion. There is interval clearing of right pleural effusion and improvement in aeration of right mid and right lower lung fields. Electronically Signed   By: Elmer Picker M.D.   On: 08/03/2021 16:25   Medications: Infusions:  sodium chloride      Scheduled  Medications:  aspirin  325 mg Oral QPM   Or   aspirin  300 mg Rectal QPM   atorvastatin  40 mg Oral q1800   calcium acetate  2,001 mg Oral TID WC   carvedilol  12.5 mg Oral BID   Chlorhexidine Gluconate Cloth  6 each Topical Q0600   doxercalciferol  1 mcg Intravenous Once per day on Mon Wed Fri Sat   feeding supplement  237 mL Oral BID BM   FLUoxetine  20 mg Oral QHS   gabapentin  100 mg Oral QHS   heparin  5,000 Units Subcutaneous Q8H   insulin aspart  0-6 Units Subcutaneous TID WC   losartan  100 mg Oral QHS   midodrine  10 mg Oral Once per day on Mon  Wed Fri Sat   multivitamin  1 tablet Oral QHS   sodium chloride flush  3 mL Intravenous Q12H   umeclidinium bromide  1 puff Inhalation Daily    have reviewed scheduled and prn medications.  Physical Exam: General: chronically ill but appears to be at baseline Heart: RRR Lungs: actually mostly clear Abdomen: soft, non tender Extremities: min peripheral edema  Dialysis Access: left AVF-  patent    08/04/2021,8:44 AM  LOS: 0 days

## 2021-08-05 LAB — HEPATITIS B SURFACE ANTIBODY, QUANTITATIVE: Hep B S AB Quant (Post): 107.4 m[IU]/mL (ref >9.9)

## 2021-08-06 ENCOUNTER — Telehealth: Payer: Self-pay

## 2021-08-06 ENCOUNTER — Other Ambulatory Visit: Payer: Self-pay

## 2021-08-06 NOTE — Telephone Encounter (Signed)
From the discharge call:  She said she feels great, better than she ever felt leaving the hospital. She was at HD at the time of this call.  She said she has all of her medications and did not have any questions about her med regime.   Also has O2 through [employer]. Uses it at night when she lays down and most of the time during the day. Has been using the O2 @ 3-4L/min. She is requesting a POC  Scheduled to see Dr Margarita Rana  on 08/14/2021 @ 1050. At patient's request, this was arranged with patient's daughter, [name].

## 2021-08-06 NOTE — Telephone Encounter (Signed)
Transition Care Management Follow-up Telephone Call Date of discharge and from where: 08/04/2021 from [facility] How have you been since you were released from the hospital? Pt stated she is feeling good and did not have any questions or concerns at this time.  Any questions or concerns? No  Items Reviewed: Did the pt receive and understand the discharge instructions provided? Yes  Medications obtained and verified? Yes  Other? No  Any new allergies since your discharge? No  Dietary orders reviewed? No Do you have support at home? Yes   Functional Questionnaire: (I = Independent and D = Dependent) ADLs: I  Bathing/Dressing- I  Meal Prep- I  Eating- I  Maintaining continence- I  Transferring/Ambulation- I  Managing Meds- I - dtr assists.    Follow up appointments reviewed:  PCP Hospital f/u appt confirmed? No   Specialist Hospital f/u appt confirmed? No   Are transportation arrangements needed? No  If their condition worsens, is the pt aware to call PCP or go to the Emergency Dept.? Yes Was the patient provided with contact information for the PCP's office or ED? Yes Was to pt encouraged to call back with questions or concerns? Yes

## 2021-08-06 NOTE — Telephone Encounter (Signed)
Transition Care Management Follow-up Telephone Call Date of discharge and from where: 08/04/2021, [facility]  How have you been since you were released from the hospital? She said she feels great, better than she ever felt leaving the hospital. She was at HD at the time of this call.  Any questions or concerns? Yes - she would like a POC  Items Reviewed: Did the pt receive and understand the discharge instructions provided? Yes  Medications obtained and verified? Yes - she said she has all of her medications and did not have any questions about her med regime.  Other? No  Any new allergies since your discharge? No  Do you have support at home? Yes   Home Care and Equipment/Supplies: Were home health services ordered? no If so, what is the name of the agency? N/a  Has the agency set up a time to come to the patient's home? not applicable Were any new equipment or medical supplies ordered?  No What is the name of the medical supply agency? N/a Were you able to get the supplies/equipment? N/a Do you have any questions related to the use of the equipment or supplies? No  She has a nebulizer and glucometer.   Also has O2 through [employer]. Uses it at night when she lays down and most of the time during the day. Has been using the O2 @ 3-4L /min.   Attends HD: M/W/T/S at [facility].   Functional Questionnaire: (I = Independent and D = Dependent) ADLs: independent.  Has rollator to use with ambulation.  Follow up appointments reviewed:  PCP Hospital f/u appt confirmed? Yes  Scheduled to see Dr Margarita Rana  on 08/14/2021 @ 1050. At patient's request, this was arranged with patient's daughter, [name]. [facility] f/u appt confirmed? No  - not scheduled yet. Are transportation arrangements needed? No  If their condition worsens, is the pt aware to call PCP or go to the Emergency Dept.? Yes Was the patient provided with contact information for the PCP's office or ED? Yes Was to pt  encouraged to call back with questions or concerns? Yes

## 2021-08-07 ENCOUNTER — Other Ambulatory Visit: Payer: Self-pay | Admitting: Family Medicine

## 2021-08-07 ENCOUNTER — Other Ambulatory Visit: Payer: Self-pay

## 2021-08-07 NOTE — Telephone Encounter (Signed)
noted 

## 2021-08-07 NOTE — Telephone Encounter (Signed)
Medication is OTC

## 2021-08-07 NOTE — Telephone Encounter (Signed)
Will address portable oxygen concentrator at upcoming visit so that justification/necessity can be documented in office notes.

## 2021-08-08 ENCOUNTER — Other Ambulatory Visit: Payer: Self-pay

## 2021-08-09 ENCOUNTER — Encounter (HOSPITAL_COMMUNITY): Payer: Medicare Other

## 2021-08-14 ENCOUNTER — Encounter: Payer: Self-pay | Admitting: Family Medicine

## 2021-08-14 ENCOUNTER — Other Ambulatory Visit: Payer: Self-pay

## 2021-08-14 ENCOUNTER — Ambulatory Visit: Payer: Medicare Other | Attending: Family Medicine | Admitting: Family Medicine

## 2021-08-14 VITALS — BP 191/69 | HR 74 | Ht 63.0 in | Wt 171.0 lb

## 2021-08-14 DIAGNOSIS — E1169 Type 2 diabetes mellitus with other specified complication: Secondary | ICD-10-CM

## 2021-08-14 DIAGNOSIS — E1140 Type 2 diabetes mellitus with diabetic neuropathy, unspecified: Secondary | ICD-10-CM

## 2021-08-14 DIAGNOSIS — E1122 Type 2 diabetes mellitus with diabetic chronic kidney disease: Secondary | ICD-10-CM

## 2021-08-14 DIAGNOSIS — N185 Chronic kidney disease, stage 5: Secondary | ICD-10-CM

## 2021-08-14 DIAGNOSIS — N186 End stage renal disease: Secondary | ICD-10-CM

## 2021-08-14 DIAGNOSIS — J9611 Chronic respiratory failure with hypoxia: Secondary | ICD-10-CM

## 2021-08-14 DIAGNOSIS — E785 Hyperlipidemia, unspecified: Secondary | ICD-10-CM

## 2021-08-14 DIAGNOSIS — I120 Hypertensive chronic kidney disease with stage 5 chronic kidney disease or end stage renal disease: Secondary | ICD-10-CM

## 2021-08-14 DIAGNOSIS — R9389 Abnormal findings on diagnostic imaging of other specified body structures: Secondary | ICD-10-CM

## 2021-08-14 DIAGNOSIS — Z1211 Encounter for screening for malignant neoplasm of colon: Secondary | ICD-10-CM

## 2021-08-14 DIAGNOSIS — G4709 Other insomnia: Secondary | ICD-10-CM

## 2021-08-14 DIAGNOSIS — Z992 Dependence on renal dialysis: Secondary | ICD-10-CM

## 2021-08-14 LAB — POCT GLYCOSYLATED HEMOGLOBIN (HGB A1C): HbA1c, POC (controlled diabetic range): 6.4 % (ref 0.0–7.0)

## 2021-08-14 LAB — GLUCOSE, POCT (MANUAL RESULT ENTRY): POC Glucose: 235 mg/dl — AB (ref 70–99)

## 2021-08-14 MED ORDER — MISC. DEVICES MISC: 0 refills | Status: DC

## 2021-08-14 MED ORDER — MIRTAZAPINE 15 MG PO TBDP
15.0000 mg | ORAL_TABLET | Freq: Every day | ORAL | 1 refills | Status: DC
  Filled 2021-08-14: qty 90, 90d supply, fill #0

## 2021-08-14 NOTE — Progress Notes (Signed)
Subjective:  Patient ID: Tina Gomez, female    DOB: [birth year]  Age: 65 y.o. MRN: [id number]  CC: Hospitalization Follow-up   HPI Tina Gomez is a 65 y.o. year old female with a history of hypertension, hyperlipidemia, type 2 diabetes mellitus (diet controlled with A1c 6.2), GERD, COPD, ESRD on HD (M,W,F at [facility]), hypertensive heart disease (EF 30% from echo 11/2020), chronic respiratory failure on 3- 4 L oxygen qhs and prn She is here for transition of care visit after hospitalization at [facility] from 08/03/2021 through 08/04/2021 for acute on chronic respiratory failure with hypoxia. She also had right-sided weakness and was thought to have possibly had a TIA versus uremic encephalopathy. MRI brain revealed: IMPRESSION: Negative for acute infarct.  Mild chronic ischemic changes.  Repeat chest x-ray recommended as a follow-up recommended due to abnormal chest x-ray finding from hospitalization. Chest x-ray had revealed: IMPRESSION: 1. No significant change from the previous day's study. 2. Left mid to lower lung zone consolidation consistent with pneumonia and/or atelectasis. 3. Diffuse bilateral interstitial thickening is similar to multiple older exams, presumed chronic. Superimposed interstitial edema is not excluded. 4. No convincing pleural effusion the lateral view.     Interval History: Additional work-up including carotid Doppler and echocardiogram were ordered but she was anxious to leave the hospital. She has appointments coming up for a Carotid doppler and echo  She needs a new walker and wheelchair and a cane will not suffice due to her weakness and chronic respiratory failure a walker and wheelchair will allow her to perform her ADLs at home and she is motivated to use a walker and wheelchair.  She can safely propel a wheelchair. She needs a portable oxygen concentrator. Current oxygen is from [employer]   BP is elevated and she is yet to take  her antihypertensive. Hemodialysis has been a lot on her and she sometimes passes out during her hemodialysis sessions.  She has questions about what would happen if she stopped going for hemodialysis sessions.  States she does not have plans of giving up as she does have her son but she would just like to have that information. She has her advanced directives in place. She talks to her daughter a lot and has a best friend she talks to a lot. Complains of insomnia as she sleeps in 2 hour increments.  Gabapentin did help with her insomnia previously but now is if ineffective  With regards to her diabetes mellitus this is diet controlled. Waiting for January for eye exam as her insurance will change then and she will have visual coverage. Past Medical History:  Diagnosis Date   Anemia    Chronic kidney disease    COPD (chronic obstructive pulmonary disease) ([place]) 05/2019   no inhaler   Diabetes mellitus without complication (HCC)    no meds - diet controlled   History of blood transfusion 09/2020   1 unit   HLD (hyperlipidemia)    Hypertension    PONV (postoperative nausea and vomiting)    Restless legs syndrome (RLS)    Stroke [facility])     Past Surgical History:  Procedure Laterality Date   AV FISTULA PLACEMENT Left 07/27/2020   Procedure: LEFT BRACHIO-BASILIC ARTERIOVENOUS (AV) FISTULA CREATION;  Surgeon: Cherre Robins, MD;  Location: [place];  Service: Vascular;  Laterality: Left;   [place] Left 11/02/2020   Procedure: LEFT UPPER ARM SECOND STAGE BASILIC VEIN TRANSPOSITION;  Surgeon: Cherre Robins, MD;  Location: [facility] OR;  Service: Vascular;  Laterality: Left;  PERIPHERAL NERVE BLOCK   CESAREAN SECTION     x 1   ESOPHAGOGASTRODUODENOSCOPY (EGD) WITH PROPOFOL N/A 05/26/2020   Procedure: ESOPHAGOGASTRODUODENOSCOPY (EGD) WITH PROPOFOL;  Surgeon: Irene Shipper, MD;  Location[facility] END[facility];  Service: Endoscopy;  Laterality: N/A;   INSERTION OF DIALYSIS CATHETER Right  07/27/2020   Procedure: INSERTION OF RIGHT INTERNAL JUGULAR TUNNELED DIALYSIS CATHETER;  Surgeon: Cherre Robins, MD;  Location: [place];  Service: Vascular;  Laterality: Right;   IR FLUORO GUIDE CV LINE RIGHT  07/25/2020   IR US GUIDE VASC ACCESS RIGHT  07/25/2020   LEFT HEART CATH AND CORONARY ANGIOGRAPHY N/A 11/28/2020   Procedure: LEFT HEART CATH AND CORONARY ANGIOGRAPHY;  Surgeon: Troy Sine, MD;  Location: [place] CV LAB;  Service: Cardiovascular;  Laterality: N/A;   TONSILLECTOMY     UPPER GI ENDOSCOPY     growth removed from voice box   WRIST SURGERY Left    ganglion cyst removal    Family History  Problem Relation Age of Onset   Stroke Mother    Hypertension Mother    Hyperlipidemia Mother    Hypertension Father    Hyperlipidemia Father     Allergies  Allergen Reactions   Codeine Nausea And Vomiting   Adhesive [Tape] Other (See Comments)    Tape breaks out the skin if it is left on for a lengthy period of time    Outpatient Medications Prior to Visit  Medication Sig Dispense Refill   acetaminophen (TYLENOL) 500 MG tablet Take 1,000 mg by mouth every 6 (six) hours as needed for mild pain or headache.     albuterol (PROVENTIL) (2.5 MG/3ML) 0.083% nebulizer solution Take 3 mLs (2.5 mg total) by nebulization every 6 (six) hours as needed for wheezing or shortness of breath. 75 mL 0   albuterol (VENTOLIN HFA) 108 (90 Base) MCG/ACT inhaler Inhale 2 puffs into the lungs every 6 (six) hours as needed for wheezing or shortness of breath. 18 g 2   atorvastatin (LIPITOR) 40 MG tablet Take 1 tablet (40 mg total) by mouth daily at 6 PM.     B Complex-C-Zn-Folic Acid (DIALYVITE 102 WITH ZINC) 0.8 MG TABS Take 1 tablet by mouth at bedtime.     Blood Glucose Monitoring Suppl (BLOOD GLUCOSE METER) kit Use as instructed 1 each 0   Blood Glucose Monitoring Suppl (TRUE METRIX METER) DEVI 1 each by Does not apply route 3 (three) times daily before meals. 1 each 0   calcium acetate  (PHOSLO) 667 MG capsule Take 667-1,334 mg by mouth See admin instructions. Take 1,334 mg by mouth three times a day before meals and 667 mg before any snack     carvedilol (COREG) 12.5 MG tablet Take 1 tablet (12.5 mg total) by mouth 2 (two) times daily with a meal. (Patient taking differently: Take 12.5 mg by mouth 2 (two) times daily.)     clopidogrel (PLAVIX) 75 MG tablet Take 1 tablet (75 mg total) by mouth daily. 30 tablet 2   dicyclomine (BENTYL) 10 MG capsule Take 1 capsule (10 mg total) by mouth 2 (two) times daily as needed for spasms. 60 capsule 3   FLUoxetine (PROZAC) 20 MG capsule Take 1 capsule (20 mg total) by mouth daily. (Patient taking differently: Take 20 mg by mouth at bedtime.) 30 capsule 2   gabapentin (NEURONTIN) 100 MG capsule Take 1 capsule (100 mg total) by mouth at bedtime.  glucose blood (TRUETEST TEST) test strip Use as instructed 100 each 12   hydrOXYzine (ATARAX/VISTARIL) 25 MG tablet Take 1 tablet (25 mg total) by mouth 3 (three) times daily as needed for anxiety.     Lancets (FREESTYLE) lancets Use as instructed 100 each 12   lidocaine-prilocaine (EMLA) cream Apply 1 application topically See admin instructions. For use on dialysis days- fistula (left arm)     loperamide (IMODIUM A-D) 2 MG tablet Take 2 mg by mouth 4 (four) times daily as needed for diarrhea or loose stools.     losartan (COZAAR) 100 MG tablet Take 1 tablet (100 mg total) by mouth every evening. (Patient taking differently: Take 100 mg by mouth See admin instructions. Take 100 mg by mouth in the evening and hold if HYPOtensive (on dialysis days)) 30 tablet 6   midodrine (PROAMATINE) 10 MG tablet Take 1 tablet (10 mg total) by mouth as directed prior to dialysis. 30 tablet 4   Misc. Devices MISC Large depends. Diagnosis - fecal incontinence 1 each 0   ondansetron (ZOFRAN) 4 MG tablet Take 1 tablet (4 mg total) by mouth every 8 (eight) hours as needed for nausea or vomiting. 60 tablet 3   OXYGEN Inhale  4 L/min into the lungs continuous.     Tiotropium Bromide Monohydrate (SPIRIVA RESPIMAT) 2.5 MCG/ACT AERS Inhale 2 puffs into the lungs daily. 4 g 3   TRUEplus Lancets 28G MISC 1 each by Does not apply route 3 (three) times daily before meals. 100 each 12   No facility-administered medications prior to visit.     ROS Review of Systems  Constitutional:  Negative for activity change, appetite change and fatigue.  HENT:  Negative for congestion, sinus pressure and sore throat.   Eyes:  Negative for visual disturbance.  Respiratory:  Negative for cough, chest tightness, shortness of breath and wheezing.   Cardiovascular:  Negative for chest pain and palpitations.  Gastrointestinal:  Negative for abdominal distention, abdominal pain and constipation.  Endocrine: Negative for polydipsia.  Genitourinary:  Negative for dysuria and frequency.  Musculoskeletal:  Negative for arthralgias and back pain.  Skin:  Negative for rash.  Neurological:  Positive for weakness. Negative for tremors, light-headedness and numbness.  Hematological:  Does not bruise/bleed easily.  Psychiatric/Behavioral:  Negative for agitation and behavioral problems.    Objective:  BP (!) 191/69   Pulse 74   Ht _0  (1.6 m)   Wt 171 lb (77.6 kg)   LMP  (LMP Unknown)   SpO2 94%   BMI 30.29 kg/m   BP/Weight 08/14/2021 35/46/5681 10/16/5168  Systolic BP 017 494 496  Diastolic BP 69 65 759  Wt. (Lbs) 171 170.2 155.2  BMI 30.29 31.13 -      Physical Exam Constitutional:      Appearance: She is well-developed.  Cardiovascular:     Rate and Rhythm: Normal rate.     Heart sounds: Normal heart sounds. No murmur heard. Pulmonary:     Effort: Pulmonary effort is normal.     Breath sounds: Normal breath sounds. No wheezing or rales.  Chest:     Chest wall: No tenderness.  Abdominal:     General: Bowel sounds are normal. There is no distension.     Palpations: Abdomen is soft. There is no mass.     Tenderness:  There is no abdominal tenderness.  Musculoskeletal:        General: Normal range of motion.     Right lower leg:  No edema.     Left lower leg: No edema.  Skin:    Findings: Bruising (on lateral aspect of R index finger and volar aspect of wrist) present.  Neurological:     Mental Status: She is alert and oriented to person, place, and time.  Psychiatric:        Mood and Affect: Mood normal.    CMP Latest Ref Rng & Units 08/04/2021 08/03/2021 08/03/2021  Glucose 70 - 99 mg/dL 159(H) - 150(H)  BUN 8 - 23 mg/dL 45(H) - 95(H)  Creatinine 0.44 - 1.00 mg/dL 5.86(H) - 9.30(H)  Sodium 135 - 145 mmol/L 138 137 136  Potassium 3.5 - 5.1 mmol/L 3.2(L) 3.5 4.2  Chloride 98 - 111 mmol/L 101 - 97(L)  CO2 22 - 32 mmol/L 26 - 19(L)  Calcium 8.9 - 10.3 mg/dL 8.3(L) - 8.2(L)  Total Protein 6.5 - 8.1 g/dL - - 6.5  Total Bilirubin 0.3 - 1.2 mg/dL - - 0.7  Alkaline Phos 38 - 126 U/L - - 63  AST 15 - 41 U/L - - 29  ALT 0 - 44 U/L - - 14    Lipid Panel     Component Value Date/Time   CHOL 251 (H) 11/25/2018 1506   TRIG 272 (H) 11/25/2018 1506   HDL 44 11/25/2018 1506   CHOLHDL 5.7 (H) 11/25/2018 1506   CHOLHDL 4.8 11/13/2016 1018   VLDL 40 (H) 02/15/2016 0907   LDLCALC 153 (H) 11/25/2018 1506    CBC    Component Value Date/Time   WBC 8.4 08/04/2021 0225   RBC 3.81 (L) 08/04/2021 0225   HGB 10.1 (L) 08/04/2021 0225   HGB 9.5 (L) 02/06/2021 0927   HCT 34.7 (L) 08/04/2021 0225   HCT 31.3 (L) 02/06/2021 0927   PLT 243 08/04/2021 0225   PLT 299 02/06/2021 0927   MCV 91.1 08/04/2021 0225   MCV 93 02/06/2021 0927   MCH 26.5 08/04/2021 0225   MCHC 29.1 (L) 08/04/2021 0225   RDW 17.4 (H) 08/04/2021 0225   RDW 15.8 (H) 02/06/2021 0927   LYMPHSABS 1.8 08/03/2021 1103   LYMPHSABS 1.6 02/06/2021 0927   MONOABS 1.0 08/03/2021 1103   EOSABS 0.3 08/03/2021 1103   EOSABS 0.2 02/06/2021 0927   BASOSABS 0.0 08/03/2021 1103   BASOSABS 0.0 02/06/2021 0927    Lab Results  Component Value Date    HGBA1C 6.4 08/14/2021    Assessment & Plan:  1. End stage renal disease on dialysis due to type 2 diabetes mellitus ([place]) Currently motivated to continue with hemodialysis sessions Should she wish to meet with hospice or palliative care and have advised that I will be happy to refer her Offered to refer her for psychotherapy with LCSW in house but she states she has her daughter to talk to and her friend as well  2. Hypertension in stage 5 chronic kidney disease due to type 2 diabetes mellitus ([place]) Blood pressure is significantly elevated and she is yet to take her antihypertensives She promises to take it when she returns back home  3. Hyperlipidemia associated with type 2 diabetes mellitus ([place]) Uncontrolled from 2020 She is currently on a statin Continue with low-cholesterol diet Will attempt lipid panel when she is fasting at a subsequent visit  4. Chronic respiratory failure with hypoxia (HCC) On 3 to 4 L of oxygen nightly as needed Prescription for portable oxygen concentrator has been written - Misc. Devices MISC; Rolling walker with seat  Dispense: 1 each; Refill:  0 - Misc. Devices MISC; Wheelchair with Accessories: elevating leg rests (ELRs), wheel locks, extensions and anti-tippers. Cane or walker will not suffice  Dispense: 1 each; Refill: 0 - Misc. Devices MISC; Portable oxygen concentrator. Chronic respiratory failure - 4L oxygen qhs and prn  Dispense: 1 each; Refill: 0  5. Type 2 diabetes mellitus with diabetic neuropathy, without long-term current use of insulin (HCC) Neuropathy is controlled on gabapentin - POCT glycosylated hemoglobin (Hb A1C) - POCT glucose (manual entry)  6. Screening for colon cancer Will order Cologuard test due to her multiple comorbidities she may be unable to withstand the stress of colonoscopy - Cologuard  7. Other insomnia Uncontrolled Placed on Remeron after shared decision making we have discussed adverse effects of Remeron -  mirtazapine (REMERON SOL-TAB) 15 MG disintegrating tablet; Take 1 tablet (15 mg total) by mouth at bedtime.  Dispense: 90 tablet; Refill: 1  8. Abnormal chest x-ray Due to previously abnormal chest x-ray we will order a follow-up chest x-ray - DG Chest 2 View; Future   Meds ordered this encounter  Medications   Misc. Devices MISC    Sig: Rolling walker with seat    Dispense:  1 each    Refill:  0   Misc. Devices MISC    Sig: Wheelchair with Accessories: elevating leg rests (ELRs), wheel locks, extensions and anti-tippers. Cane or walker will not suffice    Dispense:  1 each    Refill:  0   Misc. Devices MISC    Sig: Portable oxygen concentrator. Chronic respiratory failure - 4L oxygen qhs and prn    Dispense:  1 each    Refill:  0   mirtazapine (REMERON SOL-TAB) 15 MG disintegrating tablet    Sig: Take 1 tablet (15 mg total) by mouth at bedtime.    Dispense:  90 tablet    Refill:  1    Follow-up: Return in about 3 months (around 11/12/2021) for Chronic medical conditions.       [place]ott Rakes, MD, FAAFP. [facility], [place]   08/14/2021, 1:10 PM

## 2021-08-14 NOTE — Patient Instructions (Signed)

## 2021-08-14 NOTE — Progress Notes (Signed)
Have not took bp meds this morning

## 2021-08-16 ENCOUNTER — Other Ambulatory Visit: Payer: Self-pay

## 2021-08-21 ENCOUNTER — Ambulatory Visit (HOSPITAL_COMMUNITY)
Admission: RE | Admit: 2021-08-21 | Payer: Medicare Other | Source: Ambulatory Visit | Attending: Internal Medicine | Admitting: Internal Medicine

## 2021-08-23 ENCOUNTER — Other Ambulatory Visit (HOSPITAL_BASED_OUTPATIENT_CLINIC_OR_DEPARTMENT_OTHER): Payer: Medicare Other

## 2021-08-27 ENCOUNTER — Encounter (HOSPITAL_COMMUNITY): Payer: Self-pay | Admitting: Internal Medicine

## 2021-09-12 ENCOUNTER — Inpatient Hospital Stay (HOSPITAL_COMMUNITY)
Admission: EM | Admit: 2021-09-12 | Discharge: 2021-09-13 | DRG: 189 | Disposition: A | Discharge status: Home / Self Care | Payer: Commercial Managed Care - HMO | Attending: Internal Medicine | Admitting: Internal Medicine

## 2021-09-12 ENCOUNTER — Emergency Department (HOSPITAL_COMMUNITY): Payer: Commercial Managed Care - HMO

## 2021-09-12 ENCOUNTER — Encounter (HOSPITAL_COMMUNITY): Payer: Self-pay | Admitting: Internal Medicine

## 2021-09-12 ENCOUNTER — Other Ambulatory Visit: Payer: Self-pay

## 2021-09-12 DIAGNOSIS — I440 Atrioventricular block, first degree: Secondary | ICD-10-CM | POA: Diagnosis present

## 2021-09-12 DIAGNOSIS — I248 Other forms of acute ischemic heart disease: Secondary | ICD-10-CM | POA: Diagnosis present

## 2021-09-12 DIAGNOSIS — E1122 Type 2 diabetes mellitus with diabetic chronic kidney disease: Secondary | ICD-10-CM | POA: Diagnosis present

## 2021-09-12 DIAGNOSIS — J440 Chronic obstructive pulmonary disease with acute lower respiratory infection: Secondary | ICD-10-CM | POA: Diagnosis present

## 2021-09-12 DIAGNOSIS — J441 Chronic obstructive pulmonary disease with (acute) exacerbation: Secondary | ICD-10-CM | POA: Diagnosis present

## 2021-09-12 DIAGNOSIS — G2581 Restless legs syndrome: Secondary | ICD-10-CM | POA: Diagnosis present

## 2021-09-12 DIAGNOSIS — E785 Hyperlipidemia, unspecified: Secondary | ICD-10-CM | POA: Diagnosis present

## 2021-09-12 DIAGNOSIS — Z91048 Other nonmedicinal substance allergy status: Secondary | ICD-10-CM

## 2021-09-12 DIAGNOSIS — N2581 Secondary hyperparathyroidism of renal origin: Secondary | ICD-10-CM | POA: Diagnosis present

## 2021-09-12 DIAGNOSIS — Z8249 Family history of ischemic heart disease and other diseases of the circulatory system: Secondary | ICD-10-CM

## 2021-09-12 DIAGNOSIS — Z7189 Other specified counseling: Secondary | ICD-10-CM

## 2021-09-12 DIAGNOSIS — J189 Pneumonia, unspecified organism: Secondary | ICD-10-CM | POA: Diagnosis present

## 2021-09-12 DIAGNOSIS — Z8673 Personal history of transient ischemic attack (TIA), and cerebral infarction without residual deficits: Secondary | ICD-10-CM

## 2021-09-12 DIAGNOSIS — F17210 Nicotine dependence, cigarettes, uncomplicated: Secondary | ICD-10-CM | POA: Diagnosis present

## 2021-09-12 DIAGNOSIS — Y95 Nosocomial condition: Secondary | ICD-10-CM | POA: Diagnosis present

## 2021-09-12 DIAGNOSIS — Z7902 Long term (current) use of antithrombotics/antiplatelets: Secondary | ICD-10-CM | POA: Diagnosis not present

## 2021-09-12 DIAGNOSIS — M898X9 Other specified disorders of bone, unspecified site: Secondary | ICD-10-CM | POA: Diagnosis present

## 2021-09-12 DIAGNOSIS — Z66 Do not resuscitate: Secondary | ICD-10-CM | POA: Diagnosis present

## 2021-09-12 DIAGNOSIS — I2510 Atherosclerotic heart disease of native coronary artery without angina pectoris: Secondary | ICD-10-CM | POA: Diagnosis present

## 2021-09-12 DIAGNOSIS — I10 Essential (primary) hypertension: Secondary | ICD-10-CM | POA: Diagnosis present

## 2021-09-12 DIAGNOSIS — Z9981 Dependence on supplemental oxygen: Secondary | ICD-10-CM | POA: Diagnosis not present

## 2021-09-12 DIAGNOSIS — I5042 Chronic combined systolic (congestive) and diastolic (congestive) heart failure: Secondary | ICD-10-CM | POA: Diagnosis present

## 2021-09-12 DIAGNOSIS — I132 Hypertensive heart and chronic kidney disease with heart failure and with stage 5 chronic kidney disease, or end stage renal disease: Secondary | ICD-10-CM | POA: Diagnosis present

## 2021-09-12 DIAGNOSIS — D631 Anemia in chronic kidney disease: Secondary | ICD-10-CM | POA: Diagnosis present

## 2021-09-12 DIAGNOSIS — E1140 Type 2 diabetes mellitus with diabetic neuropathy, unspecified: Secondary | ICD-10-CM | POA: Diagnosis present

## 2021-09-12 DIAGNOSIS — Z823 Family history of stroke: Secondary | ICD-10-CM

## 2021-09-12 DIAGNOSIS — N186 End stage renal disease: Secondary | ICD-10-CM

## 2021-09-12 DIAGNOSIS — J9621 Acute and chronic respiratory failure with hypoxia: Secondary | ICD-10-CM | POA: Diagnosis present

## 2021-09-12 DIAGNOSIS — Z992 Dependence on renal dialysis: Secondary | ICD-10-CM

## 2021-09-12 DIAGNOSIS — E8770 Fluid overload, unspecified: Secondary | ICD-10-CM

## 2021-09-12 DIAGNOSIS — Z20822 Contact with and (suspected) exposure to covid-19: Secondary | ICD-10-CM | POA: Diagnosis present

## 2021-09-12 DIAGNOSIS — Z91199 Patient's noncompliance with other medical treatment and regimen due to unspecified reason: Secondary | ICD-10-CM

## 2021-09-12 DIAGNOSIS — Z79899 Other long term (current) drug therapy: Secondary | ICD-10-CM

## 2021-09-12 DIAGNOSIS — Z885 Allergy status to narcotic agent status: Secondary | ICD-10-CM

## 2021-09-12 LAB — CBC WITH DIFFERENTIAL/PLATELET
Abs Immature Granulocytes: 0.04 10*3/uL (ref 0.00–0.07)
Basophils Absolute: 0 10*3/uL (ref 0.0–0.1)
Basophils Relative: 0 %
Eosinophils Absolute: 0.1 10*3/uL (ref 0.0–0.5)
Eosinophils Relative: 1 %
HCT: 31 % — ABNORMAL LOW (ref 36.0–46.0)
Hemoglobin: 9.2 g/dL — ABNORMAL LOW (ref 12.0–15.0)
Immature Granulocytes: 0 %
Lymphocytes Relative: 9 %
Lymphs Abs: 0.9 10*3/uL (ref 0.7–4.0)
MCH: 27.3 pg (ref 26.0–34.0)
MCHC: 29.7 g/dL — ABNORMAL LOW (ref 30.0–36.0)
MCV: 92 fL (ref 80.0–100.0)
Monocytes Absolute: 0.4 10*3/uL (ref 0.1–1.0)
Monocytes Relative: 4 %
Neutro Abs: 8.3 10*3/uL — ABNORMAL HIGH (ref 1.7–7.7)
Neutrophils Relative %: 86 %
Platelets: 258 10*3/uL (ref 150–400)
RBC: 3.37 MIL/uL — ABNORMAL LOW (ref 3.87–5.11)
RDW: 17.2 % — ABNORMAL HIGH (ref 11.5–15.5)
WBC: 9.8 10*3/uL (ref 4.0–10.5)
nRBC: 0 % (ref 0.0–0.2)

## 2021-09-12 LAB — COMPREHENSIVE METABOLIC PANEL
ALT: 9 U/L (ref 0–44)
AST: 11 U/L — ABNORMAL LOW (ref 15–41)
Albumin: 2.6 g/dL — ABNORMAL LOW (ref 3.5–5.0)
Alkaline Phosphatase: 60 U/L (ref 38–126)
Anion gap: 17 — ABNORMAL HIGH (ref 5–15)
BUN: 70 mg/dL — ABNORMAL HIGH (ref 8–23)
CO2: 24 mmol/L (ref 22–32)
Calcium: 8 mg/dL — ABNORMAL LOW (ref 8.9–10.3)
Chloride: 96 mmol/L — ABNORMAL LOW (ref 98–111)
Creatinine, Ser: 7.31 mg/dL — ABNORMAL HIGH (ref 0.44–1.00)
GFR, Estimated: 6 mL/min — ABNORMAL LOW (ref >60)
Glucose, Bld: 183 mg/dL — ABNORMAL HIGH (ref 70–99)
Potassium: 3.7 mmol/L (ref 3.5–5.1)
Sodium: 137 mmol/L (ref 135–145)
Total Bilirubin: 0.5 mg/dL (ref 0.3–1.2)
Total Protein: 6.1 g/dL — ABNORMAL LOW (ref 6.5–8.1)

## 2021-09-12 LAB — RESP PANEL BY RT-PCR (FLU A&B, COVID) ARPGX2
Influenza A by PCR: NEGATIVE
Influenza B by PCR: NEGATIVE
SARS Coronavirus 2 by RT PCR: NEGATIVE

## 2021-09-12 LAB — BRAIN NATRIURETIC PEPTIDE: B Natriuretic Peptide: 1909.9 pg/mL — ABNORMAL HIGH (ref 0.0–100.0)

## 2021-09-12 LAB — TROPONIN I (HIGH SENSITIVITY)
Troponin I (High Sensitivity): 266 ng/L (ref <18)
Troponin I (High Sensitivity): 224 ng/L (ref <18)

## 2021-09-12 MED ORDER — HYDROXYZINE HCL 25 MG PO TABS: 25.0000 mg | ORAL_TABLET | Freq: Three times a day (TID) | ORAL | Status: DC | PRN

## 2021-09-12 MED ORDER — SODIUM CHLORIDE 0.9 % IV SOLN
1.0000 g | INTRAVENOUS | Status: DC
  Filled 2021-09-12: qty 1

## 2021-09-12 MED ORDER — LOSARTAN POTASSIUM 50 MG PO TABS
50.0000 mg | ORAL_TABLET | Freq: Every day | ORAL | Status: DC
  Administered 2021-09-12: 50 mg via ORAL
  Filled 2021-09-12: qty 1

## 2021-09-12 MED ORDER — ALBUTEROL SULFATE (2.5 MG/3ML) 0.083% IN NEBU: 2.5000 mg | INHALATION_SOLUTION | RESPIRATORY_TRACT | Status: DC | PRN

## 2021-09-12 MED ORDER — CALCIUM ACETATE (PHOS BINDER) 667 MG PO CAPS
667.0000 mg | ORAL_CAPSULE | ORAL | Status: DC
  Filled 2021-09-12 (×2): qty 1

## 2021-09-12 MED ORDER — NEPRO/CARBSTEADY PO LIQD: 237.0000 mL | Freq: Three times a day (TID) | ORAL | Status: DC | PRN

## 2021-09-12 MED ORDER — IPRATROPIUM-ALBUTEROL 0.5-2.5 (3) MG/3ML IN SOLN
3.0000 mL | Freq: Four times a day (QID) | RESPIRATORY_TRACT | Status: DC
  Administered 2021-09-12 (×2): 3 mL via RESPIRATORY_TRACT
  Filled 2021-09-12 (×3): qty 3

## 2021-09-12 MED ORDER — NICOTINE 14 MG/24HR TD PT24: 14.0000 mg | MEDICATED_PATCH | Freq: Every day | TRANSDERMAL | Status: DC | PRN

## 2021-09-12 MED ORDER — ACETAMINOPHEN 325 MG PO TABS: 650.0000 mg | ORAL_TABLET | Freq: Four times a day (QID) | ORAL | Status: DC | PRN

## 2021-09-12 MED ORDER — PREDNISONE 20 MG PO TABS
40.0000 mg | ORAL_TABLET | Freq: Every day | ORAL | Status: DC
  Administered 2021-09-12 – 2021-09-13 (×2): 40 mg via ORAL
  Filled 2021-09-12 (×3): qty 2

## 2021-09-12 MED ORDER — CALCIUM ACETATE (PHOS BINDER) 667 MG PO CAPS
1334.0000 mg | ORAL_CAPSULE | Freq: Three times a day (TID) | ORAL | Status: DC
  Administered 2021-09-12 – 2021-09-13 (×5): 1334 mg via ORAL
  Filled 2021-09-12 (×5): qty 2

## 2021-09-12 MED ORDER — LOSARTAN POTASSIUM 50 MG PO TABS: 100.0000 mg | ORAL_TABLET | ORAL | Status: DC

## 2021-09-12 MED ORDER — HEPARIN SODIUM (PORCINE) 5000 UNIT/ML IJ SOLN
5000.0000 [IU] | Freq: Three times a day (TID) | INTRAMUSCULAR | Status: DC
  Administered 2021-09-12 – 2021-09-13 (×5): 5000 [IU] via SUBCUTANEOUS
  Filled 2021-09-12 (×6): qty 1

## 2021-09-12 MED ORDER — VANCOMYCIN HCL IN DEXTROSE 1-5 GM/200ML-% IV SOLN
1000.0000 mg | Freq: Once | INTRAVENOUS | Status: AC
  Administered 2021-09-12: 1000 mg via INTRAVENOUS
  Filled 2021-09-12: qty 200

## 2021-09-12 MED ORDER — SODIUM CHLORIDE 0.9% FLUSH
3.0000 mL | Freq: Two times a day (BID) | INTRAVENOUS | Status: DC
  Administered 2021-09-12 (×2): 3 mL via INTRAVENOUS

## 2021-09-12 MED ORDER — ONDANSETRON HCL 4 MG PO TABS: 4.0000 mg | ORAL_TABLET | Freq: Four times a day (QID) | ORAL | Status: DC | PRN

## 2021-09-12 MED ORDER — SORBITOL 70 % SOLN
30.0000 mL | Status: DC | PRN
  Filled 2021-09-12: qty 30

## 2021-09-12 MED ORDER — ZOLPIDEM TARTRATE 5 MG PO TABS: 5.0000 mg | ORAL_TABLET | Freq: Every evening | ORAL | Status: DC | PRN

## 2021-09-12 MED ORDER — CHLORHEXIDINE GLUCONATE CLOTH 2 % EX PADS
6.0000 | MEDICATED_PAD | Freq: Every day | CUTANEOUS | Status: DC
  Administered 2021-09-13: 6 via TOPICAL

## 2021-09-12 MED ORDER — PROSOURCE PLUS PO LIQD
30.0000 mL | Freq: Two times a day (BID) | ORAL | Status: DC
  Filled 2021-09-12 (×2): qty 30

## 2021-09-12 MED ORDER — FLUOXETINE HCL 20 MG PO CAPS
20.0000 mg | ORAL_CAPSULE | Freq: Every day | ORAL | Status: DC
  Administered 2021-09-12: 20 mg via ORAL
  Filled 2021-09-12: qty 1

## 2021-09-12 MED ORDER — GABAPENTIN 100 MG PO CAPS
100.0000 mg | ORAL_CAPSULE | Freq: Every day | ORAL | Status: DC
Start: 2021-09-12 — End: 2021-09-14
  Administered 2021-09-12: 100 mg via ORAL
  Filled 2021-09-12: qty 1

## 2021-09-12 MED ORDER — RENA-VITE PO TABS
1.0000 | ORAL_TABLET | Freq: Every day | ORAL | Status: DC
  Administered 2021-09-12: 1 via ORAL
  Filled 2021-09-12: qty 1

## 2021-09-12 MED ORDER — HYDRALAZINE HCL 20 MG/ML IJ SOLN
5.0000 mg | INTRAMUSCULAR | Status: DC | PRN
  Administered 2021-09-13 (×3): 5 mg via INTRAVENOUS
  Filled 2021-09-12 (×4): qty 1

## 2021-09-12 MED ORDER — ATORVASTATIN CALCIUM 40 MG PO TABS
40.0000 mg | ORAL_TABLET | Freq: Every day | ORAL | Status: DC
  Administered 2021-09-12 – 2021-09-13 (×2): 40 mg via ORAL
  Filled 2021-09-12 (×2): qty 1

## 2021-09-12 MED ORDER — SODIUM CHLORIDE 0.9 % IV SOLN
2.0000 g | Freq: Once | INTRAVENOUS | Status: AC
  Administered 2021-09-12: 2 g via INTRAVENOUS
  Filled 2021-09-12: qty 2

## 2021-09-12 MED ORDER — DOXERCALCIFEROL 4 MCG/2ML IV SOLN
2.0000 ug | INTRAVENOUS | Status: DC
  Filled 2021-09-12 (×2): qty 2

## 2021-09-12 MED ORDER — ONDANSETRON HCL 4 MG/2ML IJ SOLN: 4.0000 mg | Freq: Four times a day (QID) | INTRAMUSCULAR | Status: DC | PRN

## 2021-09-12 MED ORDER — MIDODRINE HCL 5 MG PO TABS
10.0000 mg | ORAL_TABLET | ORAL | Status: DC
  Filled 2021-09-12: qty 2

## 2021-09-12 MED ORDER — ACETAMINOPHEN 650 MG RE SUPP: 650.0000 mg | Freq: Four times a day (QID) | RECTAL | Status: DC | PRN

## 2021-09-12 MED ORDER — HEPARIN SODIUM (PORCINE) 1000 UNIT/ML IJ SOLN
INTRAMUSCULAR | Status: AC
  Filled 2021-09-12: qty 5

## 2021-09-12 MED ORDER — CALCIUM CARBONATE ANTACID 1250 MG/5ML PO SUSP
500.0000 mg | Freq: Four times a day (QID) | ORAL | Status: DC | PRN
  Filled 2021-09-12: qty 5

## 2021-09-12 MED ORDER — IPRATROPIUM-ALBUTEROL 0.5-2.5 (3) MG/3ML IN SOLN
3.0000 mL | Freq: Three times a day (TID) | RESPIRATORY_TRACT | Status: DC
  Filled 2021-09-12: qty 3

## 2021-09-12 MED ORDER — MIDODRINE HCL 5 MG PO TABS: 10.0000 mg | ORAL_TABLET | Freq: Every day | ORAL | Status: DC

## 2021-09-12 MED ORDER — CAMPHOR-MENTHOL 0.5-0.5 % EX LOTN
1.0000 "application " | TOPICAL_LOTION | Freq: Three times a day (TID) | CUTANEOUS | Status: DC | PRN
  Filled 2021-09-12: qty 222

## 2021-09-12 MED ORDER — CARVEDILOL 12.5 MG PO TABS
12.5000 mg | ORAL_TABLET | Freq: Two times a day (BID) | ORAL | Status: DC
  Administered 2021-09-12 – 2021-09-13 (×4): 12.5 mg via ORAL
  Filled 2021-09-12 (×5): qty 1

## 2021-09-12 MED ORDER — DOCUSATE SODIUM 283 MG RE ENEM
1.0000 | ENEMA | RECTAL | Status: DC | PRN
  Filled 2021-09-12: qty 1

## 2021-09-12 NOTE — ED Triage Notes (Signed)
Pt presents on NRB satting 100%, pt was 82% on baseline 3L [place]. Improved to 97% on NRB for EMS. PT received 125mg  solumedrol and x1 duoneb w/ no relief.

## 2021-09-12 NOTE — Assessment & Plan Note (Addendum)
-  Patient's shortness of breath and productive cough are most likely caused by volume overload, but there may also be a component of acute COPD exacerbation.  -She has history of O2-dependent COPD but is requiring more O2 than usual -She does not have fever or leukocytosis.  -Nebulizers: scheduled Duoneb and prn albuterol -Prednisone 40 mg PO daily -IV Cefepime

## 2021-09-12 NOTE — Assessment & Plan Note (Signed)
-  Last A1c was 6.2, indicating good control -She is not taking medications for this issue at this time -Will not treat for now, unless glucose is rising significantly from prednisone

## 2021-09-12 NOTE — Progress Notes (Signed)
Manufacturing engineer [facility]) Hospital Liaison note.    Liaison received a request to provide information on hospice at home services. Visited patient and daughter at bedside. Provided education on services and hospice philosophy, answered questions. Patient is not sure she is ready to discontinue dialysis at this time. Also provided information on [facility] palliative program.   Provided daughter with educational materials and [facility] contact information.   Please do not hesitate to call with questions.   Thank you,   Farrel Gordon, RN, [facility] Liaison   [phone]

## 2021-09-12 NOTE — H&P (Signed)
History and Physical    Patient: Tina Gomez EHO:[id number] DOB: 07-18-1956 DOA: 09/12/2021 DOS: the patient was seen and examined on 09/12/2021 PCP: Charlott Rakes, MD  Patient coming from: Home - lives with daughter and her son and her grandchildren; NOK: Daughter, [phone]  Chief Complaint: SOB  HPI: Tina Gomez is a 66 y.o. 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  She called her daughter and said she couldn't breathe.  Despite 5L home O2, she was in the 80s.  They did a breathing treatment without improvement and so called 911.  She is very congested.  Cough is productive of yellow sputum, different from her usual.  She is unable to tolerate 4 hours of HD and so she gets 3 hours 4 days a week of HD.  Last HD was Monday and was normal.  Family has noticed that she is chronically exhausted, sleeping a lot more.  They have considered stopping HD but she is not ready yet.  They would like a hospice consult (not palliative care).  MOST form is completed with goal for comfort measures, DNR.  She was last admitted from 11/25-26 for acute on chronic respiratory failure and required BIPAP.  She was dialyzed and improved and so was discharged without antibiotics, on her home 3L O2.    ER Course:  Carryover, per Dr. Myna Hidalgo:  66 year old lady with history of COPD on 3 to 4 L/min, ESRD on HD MWFS, CHF with EF 30%, multivessel CAD, and history of CVA who presents with worsening shortness of breath, cough, congestion, and chest tightness.  She was found to be saturating 82% on her home O2 with EMS and was treated with 125 mg IV Solu-Medrol and brought into the ED on nonrebreather.  Saturating upper 90s on 6 L/min in the ED where she is found on CXR to have interstitial and airspace opacities bilaterally.  BNP is 1910 and initial troponin 266.  1st deg AV block and borderline repolarization  abnormality on EKG.    Review of Systems: As mentioned in the history of present illness. All other systems reviewed and are negative. Past Medical History:  Diagnosis Date   Anemia    Chronic kidney disease    COPD (chronic obstructive pulmonary disease) ([place]) 05/2019   no inhaler   Diabetes mellitus without complication ([place])    no meds - diet controlled   History of blood transfusion 09/2020   1 unit   HLD (hyperlipidemia)    Hypertension    PONV (postoperative nausea and vomiting)    Restless legs syndrome (RLS)    Stroke [facility])    Past Surgical History:  Procedure Laterality Date   AV FISTULA PLACEMENT Left 07/27/2020   Procedure: LEFT BRACHIO-BASILIC ARTERIOVENOUS (AV) FISTULA CREATION;  Surgeon: Cherre Robins, MD;  Location: [place];  Service: Vascular;  Laterality: Left;   [place] Left 11/02/2020   Procedure: LEFT UPPER ARM SECOND STAGE BASILIC VEIN TRANSPOSITION;  Surgeon: Cherre Robins, MD;  Location: [place];  Service: Vascular;  Laterality: Left;  PERIPHERAL NERVE BLOCK   CESAREAN SECTION     x 1   ESOPHAGOGASTRODUODENOSCOPY (EGD) WITH PROPOFOL N/A 05/26/2020   Procedure: ESOPHAGOGASTRODUODENOSCOPY (EGD) WITH PROPOFOL;  Surgeon: Irene Shipper, MD;  Location: [place];  Service: Endoscopy;  Laterality: N/A;   INSERTION OF DIALYSIS CATHETER Right 07/27/2020  Procedure: INSERTION OF RIGHT INTERNAL JUGULAR TUNNELED DIALYSIS CATHETER;  Surgeon: Cherre Robins, MD;  Location: [place];  Service: Vascular;  Laterality: Right;   IR FLUORO GUIDE CV LINE RIGHT  07/25/2020   IR US GUIDE VASC ACCESS RIGHT  07/25/2020   LEFT HEART CATH AND CORONARY ANGIOGRAPHY N/A 11/28/2020   Procedure: LEFT HEART CATH AND CORONARY ANGIOGRAPHY;  Surgeon: Troy Sine, MD;  Location: [place] CV LAB;  Service: Cardiovascular;  Laterality: N/A;   TONSILLECTOMY     UPPER GI ENDOSCOPY     growth removed from voice box   WRIST SURGERY Left    ganglion cyst removal    Social History:  reports that she has been smoking cigarettes. She has a 47.00 pack-year smoking history. She has never used smokeless tobacco. She reports that she does not drink alcohol and does not use drugs.  Allergies  Allergen Reactions   Codeine Nausea And Vomiting   Adhesive [Tape] Other (See Comments)    Tape breaks out the skin if it is left on for a lengthy period of time    Family History  Problem Relation Age of Onset   Stroke Mother    Hypertension Mother    Hyperlipidemia Mother    Hypertension Father    Hyperlipidemia Father     Prior to Admission medications   Medication Sig Start Date End Date Taking? Authorizing Provider  acetaminophen (TYLENOL) 500 MG tablet Take 1,000 mg by mouth every 6 (six) hours as needed for mild pain or headache.   Yes [provider]  albuterol (PROVENTIL) (2.5 MG/3ML) 0.083% nebulizer solution Take 3 mLs (2.5 mg total) by nebulization every 6 (six) hours as needed for wheezing or shortness of breath. 07/12/21  Yes Charlott Rakes, MD  albuterol (VENTOLIN HFA) 108 (90 Base) MCG/ACT inhaler Inhale 2 puffs into the lungs every 6 (six) hours as needed for wheezing or shortness of breath. 07/11/21  Yes Charlott Rakes, MD  atorvastatin (LIPITOR) 40 MG tablet Take 1 tablet (40 mg total) by mouth daily at 6 PM. 04/15/21  Yes Jennye Boroughs, MD  B Complex-C-Zn-Folic Acid (DIALYVITE 712 WITH ZINC) 0.8 MG TABS Take 1 tablet by mouth at bedtime. 08/12/20  Yes [provider]  calcium acetate (PHOSLO) 667 MG capsule Take 667-1,334 mg by mouth See admin instructions. Take 1,334 mg by mouth three times a day before meals and 667 mg before any snack   Yes [provider]  carvedilol (COREG) 12.5 MG tablet Take 1 tablet (12.5 mg total) by mouth 2 (two) times daily with a meal. Patient taking differently: Take 12.5 mg by mouth 2 (two) times daily. 04/15/21 04/15/22 Yes Jennye Boroughs, MD  FLUoxetine (PROZAC) 20 MG capsule Take 1 capsule  (20 mg total) by mouth daily. Patient taking differently: Take 20 mg by mouth at bedtime. 07/11/21  Yes Charlott Rakes, MD  gabapentin (NEURONTIN) 100 MG capsule Take 1 capsule (100 mg total) by mouth at bedtime. 04/15/21  Yes Jennye Boroughs, MD  lidocaine-prilocaine (EMLA) cream Apply 1 application topically See admin instructions. For use on dialysis days- fistula (left arm) 12/04/20  Yes [provider]  loperamide (IMODIUM A-D) 2 MG tablet Take 2 mg by mouth 4 (four) times daily as needed for diarrhea or loose stools.   Yes [provider]  losartan (COZAAR) 100 MG tablet Take 1 tablet (100 mg total) by mouth every evening. Patient taking differently: Take 100 mg by mouth See admin instructions. Take 100 mg  by mouth in the evening and hold if HYPOtensive (on dialysis days) 05/01/21  Yes Charlott Rakes, MD  midodrine (PROAMATINE) 10 MG tablet Take 1 tablet (10 mg total) by mouth as directed prior to dialysis. 05/16/21  Yes   ondansetron (ZOFRAN) 4 MG tablet Take 1 tablet (4 mg total) by mouth every 8 (eight) hours as needed for nausea or vomiting. 06/19/21  Yes Charlott Rakes, MD  OXYGEN Inhale 4 L/min into the lungs continuous.   Yes [provider]  Tiotropium Bromide Monohydrate (SPIRIVA RESPIMAT) 2.5 MCG/ACT AERS Inhale 2 puffs into the lungs daily. 01/30/21  Yes Charlott Rakes, MD  Blood Glucose Monitoring Suppl (BLOOD GLUCOSE METER) kit Use as instructed 11/01/13   Reyne Dumas, MD  Blood Glucose Monitoring Suppl (TRUE METRIX METER) DEVI 1 each by Does not apply route 3 (three) times daily before meals. 06/02/20   Charlott Rakes, MD  clopidogrel (PLAVIX) 75 MG tablet Take 1 tablet (75 mg total) by mouth daily. Patient not taking: Reported on 09/12/2021 08/06/21 11/04/21  Bonnielee Haff, MD  dicyclomine (BENTYL) 10 MG capsule Take 1 capsule (10 mg total) by mouth 2 (two) times daily as needed for spasms. Patient not taking: Reported on 09/12/2021 05/01/21   Charlott Rakes,  MD  glucose blood (TRUETEST TEST) test strip Use as instructed 06/02/20   Charlott Rakes, MD  hydrOXYzine (ATARAX/VISTARIL) 25 MG tablet Take 1 tablet (25 mg total) by mouth 3 (three) times daily as needed for anxiety. Patient not taking: Reported on 09/12/2021 04/15/21   Jennye Boroughs, MD  Lancets (FREESTYLE) lancets Use as instructed 11/01/13   Reyne Dumas, MD  mirtazapine (REMERON SOL-TAB) 15 MG disintegrating tablet Take 1 tablet (15 mg total) by mouth at bedtime. Patient not taking: Reported on 09/12/2021 08/14/21   Charlott Rakes, MD  Misc. Devices MISC Large depends. Diagnosis - fecal incontinence 01/30/21   Charlott Rakes, MD  Misc. Devices MISC Rolling walker with seat 08/14/21   Charlott Rakes, MD  Misc. Devices MISC Wheelchair with Accessories: elevating leg rests (ELRs), wheel locks, extensions and anti-tippers. Cane or walker will not suffice 08/14/21   Charlott Rakes, MD  Misc. Devices MISC Portable oxygen concentrator. Chronic respiratory failure - 4L oxygen qhs and prn 08/14/21   Charlott Rakes, MD  TRUEplus Lancets 28G MISC 1 each by Does not apply route 3 (three) times daily before meals. 06/02/20   Charlott Rakes, MD  amLODipine (NORVASC) 10 MG tablet Take 1 tablet (10 mg total) by mouth daily. 08/01/20 11/09/20  Terrilee Croak, MD  hydrALAZINE (APRESOLINE) 25 MG tablet Take 1 tablet (25 mg total) by mouth every 8 (eight) hours. 07/31/20 11/09/20  Terrilee Croak, MD  omeprazole (PRILOSEC) 20 MG capsule TAKE 1 CAPSULE (20 MG TOTAL) BY MOUTH 2 (TWO) TIMES DAILY BEFORE A MEAL. 08/01/20 08/22/20  Charlott Rakes, MD    Physical Exam: Vitals:   09/12/21 1530 09/12/21 1600 09/12/21 1630 09/12/21 1700  BP: (!) 176/76 (!) 170/71 (!) 164/72 (!) 155/67  Pulse:  (!) 59 60 62  Resp:  _0 Temp:      TempSrc:      SpO2:       General:  Appears frail and chronically ill; appears much older than stated age Eyes:  PERRL, EOMI, normal lids, iris ENT:  grossly normal hearing, lips & tongue,  mmm Neck:  no LAD, masses or thyromegaly Cardiovascular:  RRR, no m/r/g. No LE edema.  Respiratory:   CTA bilaterally with no wheezes/rales/rhonchi.  Normal  to mildly increased respiratory effort. Abdomen:  soft, NT, ND Skin:  no rash or induration seen on limited exam Musculoskeletal:  grossly normal tone BUE/BLE, good ROM, no bony abnormality Psychiatric:  blunted mood and affect, speech fluent and appropriate, AOx3 Neurologic:  CN 2-12 grossly intact, moves all extremities in coordinated fashion   Radiological Exams on Admission: Independently reviewed - see discussion in A/P where applicable  DG Chest Port 1 View  Result Date: 09/12/2021 CLINICAL DATA:  Cough, congestion. EXAM: PORTABLE CHEST 1 VIEW COMPARISON:  08/04/2021. FINDINGS: The heart is enlarged and the pulmonary vasculature is mildly distended. Atherosclerotic calcification of the aorta is noted. Interstitial and airspace opacities are present in the lungs bilaterally. There are likely small bilateral pleural effusions. No pneumothorax. No acute osseous abnormality. IMPRESSION: 1. Cardiomegaly with pulmonary vascular congestion. 2. Interstitial and airspace opacities in the lungs bilaterally, possible edema or infiltrate. 3. Small bilateral pleural effusions. Electronically Signed   By: Brett Fairy M.D.   On: 09/12/2021 04:45    EKG: Independently reviewed.  NSR with rate 71; nonspecific ST changes with no evidence of acute ischemia   Labs on Admission: I have personally reviewed the available labs and imaging studies at the time of the admission.  Pertinent labs:   Glucose 183 BUN 70/Creatinine 7.31/GFR 6 Calcium 8.0 Anion gap 17 Albumin 2.6 BNP 1909.9 HS troponin 266, 224 Hgb 9.2 COVID/flu negative     Assessment/Plan * Acute on chronic respiratory failure with hypoxia (HCC)- (present on admission) -Patient with ESRD on 4 times weekly HD because she cannot tolerate but 3 hours of HD per session -She is  presenting with volume overload -She was recently admitted for the same and required BIPAP -Today she did not require BIPAP but her O2 sats were as low as 90% on 6L [place] O2 -This is likely multifactorial and associated with fluid overload > PNA -Will admit to progressive care for now   Goals of care, counseling/discussion -She is DNR -MOST form completed by palliative care on prior admission with goal primarily for comfort -In this circumstance and with recurrent episodes of volume overload and general intolerance to HD, it may be reasonable to stop HD and transition to comfort only -She is considering this but is not quite ready -She request hospice consult rather than palliative care -I have reached out to the hospice liaison accordingly  COPD with acute exacerbation ([place])- (present on admission) -Patient's shortness of breath and productive cough are most likely caused by volume overload, but there may also be a component of acute COPD exacerbation.  -She has history of O2-dependent COPD but is requiring more O2 than usual -She does not have fever or leukocytosis.  -Nebulizers: scheduled Duoneb and prn albuterol -Prednisone 40 mg PO daily -IV Cefepime  HCAP (healthcare-associated pneumonia)- (present on admission) -She reports increase in sputum and change in purulence -CXR with possible infiltrate on CXR -More likely COPD exacerbation, will treat with Cefepime  ESRD on hemodialysis (HCC) Volume overload in an ESRD on HD patient -She is in a difficult position because of her need for 4 times weekly HD -She is likely to benefit from serial HD -She is considering stopping HD -Nephrology prn order set utilized -Nephrology is aware that patient will need HD  CAD (coronary artery disease)- (present on admission) -Cardiac cath 11/2020: 70% proximal right coronary artery  followed by 50% with otherwise non obstructive disease.  -Troponins are currently elevated but flat -Doubt  ACS -Will follow clinically  Essential hypertension, benign- (present on admission) -Continue Cozaar, Coreg -Continue Midodrine on HD days  Type 2 diabetes mellitus with diabetic neuropathy, unspecified ([place])- (present on admission) -Last A1c was 6.2, indicating good control -She is not taking medications for this issue at this time -Will not treat for now, unless glucose is rising significantly from prednisone     Advance Care Planning:   Code Status: DNR   Consults: Nephrology; Hospice; [facility] team  Family Communication: Daughter was present throughout evaluation  Severity of Illness: The appropriate patient status for this patient is INPATIENT. Inpatient status is judged to be reasonable and necessary in order to provide the required intensity of service to ensure the patient's safety. The patient's presenting symptoms, physical exam findings, and initial radiographic and laboratory data in the context of their chronic comorbidities is felt to place them at high risk for further clinical deterioration. Furthermore, it is not anticipated that the patient will be medically stable for discharge from the hospital within 2 midnights of admission.   * I certify that at the point of admission it is my clinical judgment that the patient will require inpatient hospital care spanning beyond 2 midnights from the point of admission due to high intensity of service, high risk for further deterioration and high frequency of surveillance required.*  Author: Karmen Bongo, MD 09/12/2021 5:52 PM  For on call review [email or web address].

## 2021-09-12 NOTE — Assessment & Plan Note (Signed)
-  Patient with ESRD on 4 times weekly HD because she cannot tolerate but 3 hours of HD per session -She is presenting with volume overload -She was recently admitted for the same and required BIPAP -Today she did not require BIPAP but her O2 sats were as low as 90% on 6L [place] O2 -This is likely multifactorial and associated with fluid overload > PNA -Will admit to progressive care for now

## 2021-09-12 NOTE — Assessment & Plan Note (Signed)
-  She reports increase in sputum and change in purulence -CXR with possible infiltrate on CXR -More likely COPD exacerbation, will treat with Cefepime

## 2021-09-12 NOTE — Assessment & Plan Note (Signed)
-  She is DNR -MOST form completed by palliative care on prior admission with goal primarily for comfort -In this circumstance and with recurrent episodes of volume overload and general intolerance to HD, it may be reasonable to stop HD and transition to comfort only -She is considering this but is not quite ready -She request hospice consult rather than palliative care -I have reached out to the hospice liaison accordingly

## 2021-09-12 NOTE — ED Notes (Signed)
Report given to dialysis RN

## 2021-09-12 NOTE — Assessment & Plan Note (Signed)
-  Continue Cozaar, Coreg -Continue Midodrine on HD days

## 2021-09-12 NOTE — Assessment & Plan Note (Signed)
-  Cardiac cath 11/2020: 70% proximal right coronary artery followed by 50% with otherwise non obstructive disease.  -Troponins are currently elevated but flat -Doubt ACS -Will follow clinically

## 2021-09-12 NOTE — Assessment & Plan Note (Signed)
Volume overload in an ESRD on HD patient -She is in a difficult position because of her need for 4 times weekly HD -She is likely to benefit from serial HD -She is considering stopping HD -Nephrology prn order set utilized -Nephrology is aware that patient will need HD

## 2021-09-12 NOTE — ED Provider Notes (Signed)
[facility] Provider Note   CSN: [id number] Arrival date & time: 09/12/21  0405     History  Chief Complaint  Patient presents with   Respiratory Distress    Tina Gomez is a 66 y.o. female.  Patient is a 66 year old female with past medical history of COPD coronary artery disease, congestive heart failure, end-stage renal disease on hemodialysis.  Patient presenting today for evaluation of difficulty breathing.  This started earlier this evening.  She describes congestion and cough.  She feels a tightness in the center of her chest, but denies any nausea, diaphoresis.  Patient on 3 L nasal cannula at home, but was found to have oxygen saturations of 82% by EMS.  She was then placed on a nonrebreather and transported here.  She received IV Solu-Medrol by EMS.  Patient has history of end-stage renal disease and is on hemodialysis.  She is dialyzed Monday, Wednesday, Friday, and Saturday.  She went to dialysis Monday as scheduled and is due to be dialyzed today  The history is provided by the patient.      Home Medications Prior to Admission medications   Medication Sig Start Date End Date Taking? Authorizing Provider  acetaminophen (TYLENOL) 500 MG tablet Take 1,000 mg by mouth every 6 (six) hours as needed for mild pain or headache.    [provider]  albuterol (PROVENTIL) (2.5 MG/3ML) 0.083% nebulizer solution Take 3 mLs (2.5 mg total) by nebulization every 6 (six) hours as needed for wheezing or shortness of breath. 07/12/21   Charlott Rakes, MD  albuterol (VENTOLIN HFA) 108 (90 Base) MCG/ACT inhaler Inhale 2 puffs into the lungs every 6 (six) hours as needed for wheezing or shortness of breath. 07/11/21   Charlott Rakes, MD  atorvastatin (LIPITOR) 40 MG tablet Take 1 tablet (40 mg total) by mouth daily at 6 PM. 04/15/21   Jennye Boroughs, MD  B Complex-C-Zn-Folic Acid (DIALYVITE 916 WITH ZINC) 0.8 MG TABS Take 1 tablet by mouth at  bedtime. 08/12/20   [provider]  Blood Glucose Monitoring Suppl (BLOOD GLUCOSE METER) kit Use as instructed 11/01/13   Reyne Dumas, MD  Blood Glucose Monitoring Suppl (TRUE METRIX METER) DEVI 1 each by Does not apply route 3 (three) times daily before meals. 06/02/20   Charlott Rakes, MD  calcium acetate (PHOSLO) 667 MG capsule Take 667-1,334 mg by mouth See admin instructions. Take 1,334 mg by mouth three times a day before meals and 667 mg before any snack    [provider]  carvedilol (COREG) 12.5 MG tablet Take 1 tablet (12.5 mg total) by mouth 2 (two) times daily with a meal. Patient taking differently: Take 12.5 mg by mouth 2 (two) times daily. 04/15/21 04/15/22  Jennye Boroughs, MD  clopidogrel (PLAVIX) 75 MG tablet Take 1 tablet (75 mg total) by mouth daily. 08/06/21 11/04/21  Bonnielee Haff, MD  dicyclomine (BENTYL) 10 MG capsule Take 1 capsule (10 mg total) by mouth 2 (two) times daily as needed for spasms. 05/01/21   Charlott Rakes, MD  FLUoxetine (PROZAC) 20 MG capsule Take 1 capsule (20 mg total) by mouth daily. Patient taking differently: Take 20 mg by mouth at bedtime. 07/11/21   Charlott Rakes, MD  gabapentin (NEURONTIN) 100 MG capsule Take 1 capsule (100 mg total) by mouth at bedtime. 04/15/21   Jennye Boroughs, MD  glucose blood (TRUETEST TEST) test strip Use as instructed 06/02/20   Charlott Rakes, MD  hydrOXYzine (ATARAX/VISTARIL) 25 MG  tablet Take 1 tablet (25 mg total) by mouth 3 (three) times daily as needed for anxiety. 04/15/21   Jennye Boroughs, MD  Lancets (FREESTYLE) lancets Use as instructed 11/01/13   Reyne Dumas, MD  lidocaine-prilocaine (EMLA) cream Apply 1 application topically See admin instructions. For use on dialysis days- fistula (left arm) 12/04/20   [provider]  loperamide (IMODIUM A-D) 2 MG tablet Take 2 mg by mouth 4 (four) times daily as needed for diarrhea or loose stools.    [provider]  losartan (COZAAR) 100 MG tablet  Take 1 tablet (100 mg total) by mouth every evening. Patient taking differently: Take 100 mg by mouth See admin instructions. Take 100 mg by mouth in the evening and hold if HYPOtensive (on dialysis days) 05/01/21   Charlott Rakes, MD  midodrine (PROAMATINE) 10 MG tablet Take 1 tablet (10 mg total) by mouth as directed prior to dialysis. 05/16/21     mirtazapine (REMERON SOL-TAB) 15 MG disintegrating tablet Take 1 tablet (15 mg total) by mouth at bedtime. 08/14/21   Charlott Rakes, MD  Misc. Devices MISC Large depends. Diagnosis - fecal incontinence 01/30/21   Charlott Rakes, MD  Misc. Devices MISC Rolling walker with seat 08/14/21   Charlott Rakes, MD  Misc. Devices MISC Wheelchair with Accessories: elevating leg rests (ELRs), wheel locks, extensions and anti-tippers. Cane or walker will not suffice 08/14/21   Charlott Rakes, MD  Misc. Devices MISC Portable oxygen concentrator. Chronic respiratory failure - 4L oxygen qhs and prn 08/14/21   Charlott Rakes, MD  ondansetron (ZOFRAN) 4 MG tablet Take 1 tablet (4 mg total) by mouth every 8 (eight) hours as needed for nausea or vomiting. 06/19/21   Charlott Rakes, MD  OXYGEN Inhale 4 L/min into the lungs continuous.    [provider]  Tiotropium Bromide Monohydrate (SPIRIVA RESPIMAT) 2.5 MCG/ACT AERS Inhale 2 puffs into the lungs daily. 01/30/21   Charlott Rakes, MD  TRUEplus Lancets 28G MISC 1 each by Does not apply route 3 (three) times daily before meals. 06/02/20   Charlott Rakes, MD  amLODipine (NORVASC) 10 MG tablet Take 1 tablet (10 mg total) by mouth daily. 08/01/20 11/09/20  Terrilee Croak, MD  hydrALAZINE (APRESOLINE) 25 MG tablet Take 1 tablet (25 mg total) by mouth every 8 (eight) hours. 07/31/20 11/09/20  Terrilee Croak, MD  omeprazole (PRILOSEC) 20 MG capsule TAKE 1 CAPSULE (20 MG TOTAL) BY MOUTH 2 (TWO) TIMES DAILY BEFORE A MEAL. 08/01/20 08/22/20  Charlott Rakes, MD      Allergies    Codeine and Adhesive [tape]    Review of Systems    Review of Systems  All other systems reviewed and are negative.  Physical Exam Updated Vital Signs BP (!) 179/70 (BP Location: Right Arm)    Pulse 70    Temp 98.1 F (36.7 C) (Oral)    Resp (!) 25    LMP  (LMP Unknown)    SpO2 97%  Physical Exam Vitals and nursing note reviewed.  Constitutional:      General: She is not in acute distress.    Appearance: She is well-developed. She is not diaphoretic.  HENT:     Head: Normocephalic and atraumatic.  Cardiovascular:     Rate and Rhythm: Normal rate and regular rhythm.     Heart sounds: No murmur heard.   No friction rub. No gallop.  Pulmonary:     Effort: Respiratory distress present.     Breath sounds: Rhonchi and rales present.  No wheezing.     Comments: There are rhonchorous breath sounds throughout with rales at the bases.  She has tachypneic with somewhat labored breathing and is in mild respiratory distress. Abdominal:     General: Bowel sounds are normal. There is no distension.     Palpations: Abdomen is soft.     Tenderness: There is no abdominal tenderness.  Musculoskeletal:        General: Normal range of motion.     Cervical back: Normal range of motion and neck supple.  Skin:    General: Skin is warm and dry.  Neurological:     General: No focal deficit present.     Mental Status: She is alert and oriented to person, place, and time.    ED Results / Procedures / Treatments   Labs (all labs ordered are listed, but only abnormal results are displayed) Labs Reviewed  RESP PANEL BY RT-PCR (FLU A&B, COVID) ARPGX2  BRAIN NATRIURETIC PEPTIDE  CBC WITH DIFFERENTIAL/PLATELET  COMPREHENSIVE METABOLIC PANEL  TROPONIN I (HIGH SENSITIVITY)    EKG EKG Interpretation  Date/Time:  Wednesday September 12 2021 04:09:14 EST Ventricular Rate:  71 PR Interval:  248 QRS Duration: 101 QT Interval:  432 QTC Calculation: 470 R Axis:   55 Text Interpretation: Sinus rhythm Prolonged PR interval Borderline repol abnormality,  diffuse leads Confirmed by Veryl Speak [phone]) on 09/12/2021 4:33:09 AM  Radiology No results found.  Procedures Procedures  Continuous cardiac monitoring  Medications Ordered in ED Medications - No data to display  ED Course/ Medical Decision Making/ A&P   This patient presents to the ED for concern of shortness of breath, this involves an extensive number of treatment options, and is a complaint that carries with it a high risk of complications and morbidity.  The differential diagnosis includes pneumonia, CHF/volume overload related to end-stage renal disease, pulmonary embolism   Co morbidities that complicate the patient evaluation  Dialysis   Additional history obtained:  Additional history obtained from [facility] record New external records from outside sources were obtained or reviewed as this was not indicated   Lab Tests:  I Ordered, and personally interpreted labs.  The pertinent results include: Elevated BNP and metabolic panel consistent with end-stage renal state.  She is also has an elevated troponin of 266, the significance of which I am uncertain given the patient's renal function.  Remainder of laboratory studies are consistent with her baseline   Imaging Studies ordered:  I ordered imaging studies including chest x-ray I independently visualized and interpreted imaging which showed cardiomegaly with pulmonary vascular congestion and possible superimposed infiltrates I agree with the radiologist interpretation   Cardiac Monitoring:  The patient was maintained on a cardiac monitor.  I personally viewed and interpreted the cardiac monitored which showed an underlying rhythm of: Sinus rhythm   Medicines ordered and prescription drug management:  I ordered medication including vancomycin and cefepime for healthcare acquired pneumonia Reevaluation of the patient after these medicines showed that the patient improved I have reviewed the patients  home medicines and have made adjustments as needed   Test Considered:  No other test considered were indicated   Critical Interventions:  Consult to nephrology for consideration of dialysis   Consultations Obtained:  I requested consultation with the nephrology, Dr. Candiss Norse,  and discussed lab and imaging findings as well as pertinent plan - they recommend: Nephrology consultation with consideration of dialysis   Problem List / ED Course:  Patient with history of  end-stage renal disease on hemodialysis presenting with shortness of breath that appears to be related to volume overload and seen on chest x-ray.  She may also have superimposed infiltrates and was treated for healthcare acquired pneumonia. Care discussed with Dr. Candiss Norse from nephrology who will evaluate the patient this morning and make arrangements for dialysis.  Patient to be admitted to the hospitalist service   Reevaluation:  After the interventions noted above, I reevaluated the patient and found that they have :improved   Social Determinants of Health:  Mobility issues   Dispostion:  After consideration of the diagnostic results and the patients response to treatment, I feel that the patent would benefit from admission to the hospitalist service for likely dialysis.    CRITICAL CARE Performed by: Veryl Speak Total critical care time: 35 minutes Critical care time was exclusive of separately billable procedures and treating other patients. Critical care was necessary to treat or prevent imminent or life-threatening deterioration. Critical care was time spent personally by me on the following activities: development of treatment plan with patient and/or surrogate as well as nursing, discussions with consultants, evaluation of patient's response to treatment, examination of patient, obtaining history from patient or surrogate, ordering and performing treatments and interventions, ordering and review of  laboratory studies, ordering and review of radiographic studies, pulse oximetry and re-evaluation of patient's condition.    Final Clinical Impression(s) / ED Diagnoses Final diagnoses:  None    Rx / DC Orders ED Discharge Orders     None         Veryl Speak, MD 09/12/21 [phone]

## 2021-09-12 NOTE — Consult Note (Addendum)
[facility] Renal Consultation Note    Indication for Consultation:  Management of ESRD/hemodialysis; anemia, hypertension/volume and secondary hyperparathyroidism PCP: Dr. Collene Mares Nephrologist: Dr. Corliss Parish  HPI: Tina Gomez is a 66 y.o. female with ESRD on hemodialysis MWFS at [facility]. 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 Last HD 09/10/2021 left 3.5 kg above OP EDW. Issues with higher IDWG than she is able to have removed on HD.   Seen in ED. Patient says she was unable to sleep at all last PM, developed progressive SOB and chest pain-describes as "felt like I have congestion in my chest". Checked O2 sats at home, reports O2 sats were in 80s range. EMS was called and she was brought to ED for evaluation. Afebrile, BP 179/79 HR 71 RR 22 O2 sats 97% on 100% NRB now transitioned to 6L/M [place] Sats 94%. She still feels SOB. Has periorbital edema, edema in hands. No LE edema. Propped on bedside table for comfort.   Labs: AGAS pH 7.36 PCO2 42.7 PO2 67. Na 137 K+ 3.7 CO2 24 WBC 9.8 HGB 9.2 PLT 258 Troponin 224. CXR: Cardiomegaly with pulmonary vascular congestion. Interstitial and airspace opacities in the lungs bilaterally, possible edema or infiltrate.Small bilateral pleural effusions. She has received solumedroll,  nebs, ABX per primary. She has been admitted for volume overload, possible HCAP.   Talked with daughter. Patient has poor quality of life. Sleeps most of time at home when not at HD. Says she is becoming confused at times and that patient is aware of decline. Asking for Palliative Care consult for goals of care, possibly stopping HD and transitioning to comfort care.        Past Medical History:  Diagnosis Date   Anemia    Chronic kidney disease    COPD (chronic obstructive pulmonary disease) ([place]) 05/2019   no inhaler   Diabetes mellitus without  complication ([place])    no meds - diet controlled   History of blood transfusion 09/2020   1 unit   HLD (hyperlipidemia)    Hypertension    PONV (postoperative nausea and vomiting)    Restless legs syndrome (RLS)    Stroke [facility])    Past Surgical History:  Procedure Laterality Date   AV FISTULA PLACEMENT Left 07/27/2020   Procedure: LEFT BRACHIO-BASILIC ARTERIOVENOUS (AV) FISTULA CREATION;  Surgeon: Cherre Robins, MD;  Location: [place];  Service: Vascular;  Laterality: Left;   [place] Left 11/02/2020   Procedure: LEFT UPPER ARM SECOND STAGE BASILIC VEIN TRANSPOSITION;  Surgeon: Cherre Robins, MD;  Location: [place];  Service: Vascular;  Laterality: Left;  PERIPHERAL NERVE BLOCK   CESAREAN SECTION     x 1   ESOPHAGOGASTRODUODENOSCOPY (EGD) WITH PROPOFOL N/A 05/26/2020   Procedure: ESOPHAGOGASTRODUODENOSCOPY (EGD) WITH PROPOFOL;  Surgeon: Irene Shipper, MD;  Location: [place];  Service: Endoscopy;  Laterality: N/A;   INSERTION OF DIALYSIS CATHETER Right 07/27/2020   Procedure: INSERTION OF RIGHT INTERNAL JUGULAR TUNNELED DIALYSIS CATHETER;  Surgeon: Cherre Robins, MD;  Location: [place];  Service: Vascular;  Laterality: Right;   IR FLUORO GUIDE CV LINE RIGHT  07/25/2020   IR US GUIDE VASC ACCESS RIGHT  07/25/2020   LEFT HEART CATH AND CORONARY ANGIOGRAPHY N/A 11/28/2020   Procedure: LEFT HEART CATH AND CORONARY ANGIOGRAPHY;  Surgeon: Troy Sine, MD;  Location: [place] CV LAB;  Service: Cardiovascular;  Laterality: N/A;   TONSILLECTOMY     UPPER GI ENDOSCOPY     growth removed from voice box   WRIST SURGERY Left    ganglion cyst removal   Family History  Problem Relation Age of Onset   Stroke Mother    Hypertension Mother    Hyperlipidemia Mother    Hypertension Father    Hyperlipidemia Father    Social History:  reports that she has been smoking cigarettes. She has a 47.00 pack-year smoking history. She has never used smokeless tobacco. She  reports that she does not drink alcohol and does not use drugs. Allergies  Allergen Reactions   Codeine Nausea And Vomiting   Adhesive [Tape] Other (See Comments)    Tape breaks out the skin if it is left on for a lengthy period of time   Prior to Admission medications   Medication Sig Start Date End Date Taking? Authorizing Provider  acetaminophen (TYLENOL) 500 MG tablet Take 1,000 mg by mouth every 6 (six) hours as needed for mild pain or headache.   Yes [provider]  albuterol (PROVENTIL) (2.5 MG/3ML) 0.083% nebulizer solution Take 3 mLs (2.5 mg total) by nebulization every 6 (six) hours as needed for wheezing or shortness of breath. 07/12/21  Yes Charlott Rakes, MD  albuterol (VENTOLIN HFA) 108 (90 Base) MCG/ACT inhaler Inhale 2 puffs into the lungs every 6 (six) hours as needed for wheezing or shortness of breath. 07/11/21  Yes Charlott Rakes, MD  atorvastatin (LIPITOR) 40 MG tablet Take 1 tablet (40 mg total) by mouth daily at 6 PM. 04/15/21  Yes Jennye Boroughs, MD  B Complex-C-Zn-Folic Acid (DIALYVITE 660 WITH ZINC) 0.8 MG TABS Take 1 tablet by mouth at bedtime. 08/12/20  Yes [provider]  calcium acetate (PHOSLO) 667 MG capsule Take 667-1,334 mg by mouth See admin instructions. Take 1,334 mg by mouth three times a day before meals and 667 mg before any snack   Yes [provider]  carvedilol (COREG) 12.5 MG tablet Take 1 tablet (12.5 mg total) by mouth 2 (two) times daily with a meal. Patient taking differently: Take 12.5 mg by mouth 2 (two) times daily. 04/15/21 04/15/22 Yes Jennye Boroughs, MD  FLUoxetine (PROZAC) 20 MG capsule Take 1 capsule (20 mg total) by mouth daily. Patient taking differently: Take 20 mg by mouth at bedtime. 07/11/21  Yes Charlott Rakes, MD  gabapentin (NEURONTIN) 100 MG capsule Take 1 capsule (100 mg total) by mouth at bedtime. 04/15/21  Yes Jennye Boroughs, MD  lidocaine-prilocaine (EMLA) cream Apply 1 application topically See admin  instructions. For use on dialysis days- fistula (left arm) 12/04/20  Yes [provider]  loperamide (IMODIUM A-D) 2 MG tablet Take 2 mg by mouth 4 (four) times daily as needed for diarrhea or loose stools.   Yes [provider]  losartan (COZAAR) 100 MG tablet Take 1 tablet (100 mg total) by mouth every evening. Patient taking differently: Take 100 mg by mouth See admin instructions. Take 100 mg by mouth in the evening and hold if HYPOtensive (on dialysis days) 05/01/21  Yes Charlott Rakes, MD  midodrine (PROAMATINE) 10 MG tablet Take 1 tablet (10 mg total) by mouth as directed prior to dialysis. 05/16/21  Yes   ondansetron (ZOFRAN) 4 MG tablet Take 1 tablet (4 mg total) by mouth every 8 (eight) hours as needed for nausea or vomiting. 06/19/21  Yes Charlott Rakes, MD  OXYGEN Inhale 4 L/min into the lungs continuous.   Yes  [provider]  Tiotropium Bromide Monohydrate (SPIRIVA RESPIMAT) 2.5 MCG/ACT AERS Inhale 2 puffs into the lungs daily. 01/30/21  Yes Charlott Rakes, MD  Blood Glucose Monitoring Suppl (BLOOD GLUCOSE METER) kit Use as instructed 11/01/13   Reyne Dumas, MD  Blood Glucose Monitoring Suppl (TRUE METRIX METER) DEVI 1 each by Does not apply route 3 (three) times daily before meals. 06/02/20   Charlott Rakes, MD  glucose blood (TRUETEST TEST) test strip Use as instructed 06/02/20   Charlott Rakes, MD  Lancets (FREESTYLE) lancets Use as instructed 11/01/13   Reyne Dumas, MD  Misc. Devices MISC Large depends. Diagnosis - fecal incontinence 01/30/21   Charlott Rakes, MD  Misc. Devices MISC Rolling walker with seat 08/14/21   Charlott Rakes, MD  Misc. Devices MISC Wheelchair with Accessories: elevating leg rests (ELRs), wheel locks, extensions and anti-tippers. Cane or walker will not suffice 08/14/21   Charlott Rakes, MD  Misc. Devices MISC Portable oxygen concentrator. Chronic respiratory failure - 4L oxygen qhs and prn 08/14/21   Charlott Rakes, MD  TRUEplus  Lancets 28G MISC 1 each by Does not apply route 3 (three) times daily before meals. 06/02/20   Charlott Rakes, MD  amLODipine (NORVASC) 10 MG tablet Take 1 tablet (10 mg total) by mouth daily. 08/01/20 11/09/20  Terrilee Croak, MD  hydrALAZINE (APRESOLINE) 25 MG tablet Take 1 tablet (25 mg total) by mouth every 8 (eight) hours. 07/31/20 11/09/20  Terrilee Croak, MD  omeprazole (PRILOSEC) 20 MG capsule TAKE 1 CAPSULE (20 MG TOTAL) BY MOUTH 2 (TWO) TIMES DAILY BEFORE A MEAL. 08/01/20 08/22/20  Charlott Rakes, MD   Current Facility-Administered Medications  Medication Dose Route Frequency Provider Last Rate Last Admin   acetaminophen (TYLENOL) tablet 650 mg  650 mg Oral Q6H PRN Karmen Bongo, MD       Or   acetaminophen (TYLENOL) suppository 650 mg  650 mg Rectal Q6H PRN Karmen Bongo, MD       albuterol (PROVENTIL) (2.5 MG/3ML) 0.083% nebulizer solution 2.5 mg  2.5 mg Nebulization Q2H PRN Karmen Bongo, MD       atorvastatin (LIPITOR) tablet 40 mg  40 mg Oral q1800 Karmen Bongo, MD       calcium acetate (PHOSLO) capsule 1,334 mg  1,334 mg Oral TID with meals Karmen Bongo, MD   1,334 mg at 09/12/21 0949   calcium acetate (PHOSLO) capsule 667 mg  667 mg Oral With snacks Karmen Bongo, MD       calcium carbonate (dosed in mg elemental calcium) suspension 500 mg of elemental calcium  500 mg of elemental calcium Oral Q6H PRN Karmen Bongo, MD       camphor-menthol [facility]) lotion 1 application  1 application Topical P8K PRN Karmen Bongo, MD       And   hydrOXYzine (ATARAX) tablet 25 mg  25 mg Oral Q8H PRN Karmen Bongo, MD       carvedilol (COREG) tablet 12.5 mg  12.5 mg Oral BID Karmen Bongo, MD   12.5 mg at 09/12/21 0949   [START ON 09/13/2021] ceFEPIme (MAXIPIME) 1 g in sodium chloride 0.9 % 100 mL IVPB  1 g Intravenous Q24H Karmen Bongo, MD       Chlorhexidine Gluconate Cloth 2 % PADS 6 each  6 each Topical Q0600 Valentina Gu, NP       docusate sodium [facility]) enema 283  mg  1 enema Rectal PRN Karmen Bongo, MD       doxercalciferol (HECTOROL) injection 2 mcg  2 mcg Intravenous Q M,W,F-HD Valentina Gu, NP       feeding supplement (NEPRO CARB STEADY) liquid 237 mL  237 mL Oral TID PRN Karmen Bongo, MD       FLUoxetine (PROZAC) capsule 20 mg  20 mg Oral QHS Karmen Bongo, MD       gabapentin (NEURONTIN) capsule 100 mg  100 mg Oral Ivery Quale, MD       heparin injection 5,000 Units  5,000 Units Subcutaneous Lynne Logan, MD   5,000 Units at 09/12/21 6962   hydrALAZINE (APRESOLINE) injection 5 mg  5 mg Intravenous Q4H PRN Karmen Bongo, MD       ipratropium-albuterol (DUONEB) 0.5-2.5 (3) MG/3ML nebulizer solution 3 mL  3 mL Nebulization Q6H Karmen Bongo, MD   3 mL at 09/12/21 0943   losartan (COZAAR) tablet 50 mg  50 mg Oral QHS Valentina Gu, NP       midodrine (PROAMATINE) tablet 10 mg  10 mg Oral Q M,W,F-HD Valentina Gu, NP       nicotine (NICODERM CQ - dosed in mg/24 hours) patch 14 mg  14 mg Transdermal Daily PRN Karmen Bongo, MD       ondansetron [facility]) tablet 4 mg  4 mg Oral Q6H PRN Karmen Bongo, MD       Or   ondansetron [facility]) injection 4 mg  4 mg Intravenous Q6H PRN Karmen Bongo, MD       sodium chloride flush (NS) 0.9 % injection 3 mL  3 mL Intravenous Q12H Karmen Bongo, MD   3 mL at 09/12/21 0952   sorbitol 70 % solution 30 mL  30 mL Oral PRN Karmen Bongo, MD       zolpidem Lorrin Mais) tablet 5 mg  5 mg Oral QHS PRN Karmen Bongo, MD       Current Outpatient Medications  Medication Sig Dispense Refill   acetaminophen (TYLENOL) 500 MG tablet Take 1,000 mg by mouth every 6 (six) hours as needed for mild pain or headache.     albuterol (PROVENTIL) (2.5 MG/3ML) 0.083% nebulizer solution Take 3 mLs (2.5 mg total) by nebulization every 6 (six) hours as needed for wheezing or shortness of breath. 75 mL 0   albuterol (VENTOLIN HFA) 108 (90 Base) MCG/ACT inhaler Inhale 2 puffs into the lungs  every 6 (six) hours as needed for wheezing or shortness of breath. 18 g 2   atorvastatin (LIPITOR) 40 MG tablet Take 1 tablet (40 mg total) by mouth daily at 6 PM.     B Complex-C-Zn-Folic Acid (DIALYVITE 952 WITH ZINC) 0.8 MG TABS Take 1 tablet by mouth at bedtime.     calcium acetate (PHOSLO) 667 MG capsule Take 667-1,334 mg by mouth See admin instructions. Take 1,334 mg by mouth three times a day before meals and 667 mg before any snack     carvedilol (COREG) 12.5 MG tablet Take 1 tablet (12.5 mg total) by mouth 2 (two) times daily with a meal. (Patient taking differently: Take 12.5 mg by mouth 2 (two) times daily.)     FLUoxetine (PROZAC) 20 MG capsule Take 1 capsule (20 mg total) by mouth daily. (Patient taking differently: Take 20 mg by mouth at bedtime.) 30 capsule 2   gabapentin (NEURONTIN) 100 MG capsule Take 1 capsule (100 mg total) by mouth at bedtime.     lidocaine-prilocaine (EMLA) cream Apply 1 application topically See admin instructions. For use on dialysis days- fistula (left arm)  loperamide (IMODIUM A-D) 2 MG tablet Take 2 mg by mouth 4 (four) times daily as needed for diarrhea or loose stools.     losartan (COZAAR) 100 MG tablet Take 1 tablet (100 mg total) by mouth every evening. (Patient taking differently: Take 100 mg by mouth See admin instructions. Take 100 mg by mouth in the evening and hold if HYPOtensive (on dialysis days)) 30 tablet 6   midodrine (PROAMATINE) 10 MG tablet Take 1 tablet (10 mg total) by mouth as directed prior to dialysis. 30 tablet 4   ondansetron (ZOFRAN) 4 MG tablet Take 1 tablet (4 mg total) by mouth every 8 (eight) hours as needed for nausea or vomiting. 60 tablet 3   OXYGEN Inhale 4 L/min into the lungs continuous.     Tiotropium Bromide Monohydrate (SPIRIVA RESPIMAT) 2.5 MCG/ACT AERS Inhale 2 puffs into the lungs daily. 4 g 3   Blood Glucose Monitoring Suppl (BLOOD GLUCOSE METER) kit Use as instructed 1 each 0   Blood Glucose Monitoring Suppl  (TRUE METRIX METER) DEVI 1 each by Does not apply route 3 (three) times daily before meals. 1 each 0   glucose blood (TRUETEST TEST) test strip Use as instructed 100 each 12   Lancets (FREESTYLE) lancets Use as instructed 100 each 12   Misc. Devices MISC Large depends. Diagnosis - fecal incontinence 1 each 0   Misc. Devices MISC Rolling walker with seat 1 each 0   Misc. Devices MISC Wheelchair with Accessories: elevating leg rests (ELRs), wheel locks, extensions and anti-tippers. Cane or walker will not suffice 1 each 0   Misc. Devices MISC Portable oxygen concentrator. Chronic respiratory failure - 4L oxygen qhs and prn 1 each 0   TRUEplus Lancets 28G MISC 1 each by Does not apply route 3 (three) times daily before meals. 100 each 12   Labs: Basic Metabolic Panel: Recent Labs  Lab 09/12/21 0504  NA 137  K 3.7  CL 96*  CO2 24  GLUCOSE 183*  BUN 70*  CREATININE 7.31*  CALCIUM 8.0*   Liver Function Tests: Recent Labs  Lab 09/12/21 0504  AST 11*  ALT 9  ALKPHOS 60  BILITOT 0.5  PROT 6.1*  ALBUMIN 2.6*   No results for input(s): LIPASE, AMYLASE in the last 168 hours. No results for input(s): AMMONIA in the last 168 hours. CBC: Recent Labs  Lab 09/12/21 0504  WBC 9.8  NEUTROABS 8.3*  HGB 9.2*  HCT 31.0*  MCV 92.0  PLT 258   Cardiac Enzymes: No results for input(s): CKTOTAL, CKMB, CKMBINDEX, TROPONINI in the last 168 hours. CBG: No results for input(s): GLUCAP in the last 168 hours. Iron Studies: No results for input(s): IRON, TIBC, TRANSFERRIN, FERRITIN in the last 72 hours. Studies/Results: DG Chest Port 1 View  Result Date: 09/12/2021 CLINICAL DATA:  Cough, congestion. EXAM: PORTABLE CHEST 1 VIEW COMPARISON:  08/04/2021. FINDINGS: The heart is enlarged and the pulmonary vasculature is mildly distended. Atherosclerotic calcification of the aorta is noted. Interstitial and airspace opacities are present in the lungs bilaterally. There are likely small bilateral  pleural effusions. No pneumothorax. No acute osseous abnormality. IMPRESSION: 1. Cardiomegaly with pulmonary vascular congestion. 2. Interstitial and airspace opacities in the lungs bilaterally, possible edema or infiltrate. 3. Small bilateral pleural effusions. Electronically Signed   By: Brett Fairy M.D.   On: 09/12/2021 04:45    ROS: As per HPI otherwise negative.   Physical Exam: Vitals:   09/12/21 0515 09/12/21 0715 09/12/21 0845 09/12/21 0930  BP: (!) 170/56 (!) 173/62 (!) 178/99 (!) 183/83  Pulse: 71 71 74 77  Resp: 17 18 20 16   Temp:      TempSrc:      SpO2: 97% 91% 93% 90%     General: Frail older female looks older than stated age in no acute distress. Head: Normocephalic, atraumatic, sclera non-icteric, mucus membranes are moist Neck: Supple. JVD 1/4 to mandible. Lungs: Bilateral breath sounds, tight breath sounds decreased in bases with few bibasilar crackles. No WOB.  Heart: SR on monitor. S1,S2 no M/R/G. Rate 70s.  Abdomen: NABS, NT, ND Lower extremities:without edema or ischemic changes, no open wounds  Neuro: Alert and oriented X 3. Moves all extremities spontaneously. Psych:  Responds to questions appropriately with a normal affect. Dialysis Access: L AVF + T/B  Dialysis Orders: Center: [facility] MWFS 3 hr 180NRe 400/600 75 kg 3.0 K/2.5 Ca UFP 2 AVF -Heparin 5000 units IV initial bolus Heparin 2000 units IV mid run -Hectorol 1 mcg IV TIW -Mircera 225 mcg IV q 2 weeks (last dose 09/06/2021)  Assessment/Plan:  Acute hypoxic Respiratory failure in setting of pulmonary edema. Pt left 3.5 kg above OP EDW. Will have urgent HD for volume removal today.  Possible HCAP-no leukocytosis or fever. Rec'd ABX in ED. Per primary.  Elevated troponin in ESRD pt: Troponin in 200 range, flat trend. Possibly demand ischemia in setting of pulmonary edema H/O combined systolic/diastolic HF-usually managed with HD. Last EF 30% 11/2020. UF as tolerated. On Carvedilol 12.5 mg PO BID.    ESRD -  MWFS-has NOT missed HD. HD today on schedule. Will assess tomorrow for need for serial HD.   Hypertension/volume  - Hypertensive at present. Resume home meds. Hypertension usually managed by Dr. Moshe Cipro but says PCP recently increased losartan to 100 mg PO q HS. Carvedi  Anemia  - HGB 9.2. Recent OP ESA dose. Follow HGB.   Metabolic bone disease -  H/O noncompliance with binders. Resume Calcium acetate, increase dose to 3 caps PO BID, continue VDRA.   Nutrition - Low albumin. Renal/Carb mod diet with fld restriction, protein supps. DMT2-Per primary.  GOC-daughter requesting Palliative Care Consult D/T patient's poor quality of life and declining mental status.   Rita H. Owens Shark, NP-C 09/12/2021, 9:54 AM  [employer] [phone]   Seen and examined independently.  Agree with note and exam as documented above by physician extender and as noted here.  Patient is ESRD on HD MWF and Sat, combined systolic and diastolic CHF, COPD, and chronic hypoxia on home oxygen who presents very short of breath.  She is here with her daughter.  She states that she has been more fatigued and sleeping more - sometimes 15 hours at a time.  She would like HD today but she would like additional information on palliative care.  Her daughter passed away 35 years ago.  Having gone through that past event, she has spent time planning for her own passing as she has been aware more recently that her quality of life has declined.  She is still wanting to be here for her grandchildren but she and her family are discussing her goals of care.   General elderly female in bed short of breath with prolonged speech HEENT normocephalic atraumatic extraocular movements intact sclera anicteric Neck supple trachea midline Lungs crackles and wheezing; increased work of breathing with prolonged speech; on 4.5 liters oxygen  Heart S1S2   Abdomen soft nontender nondistended Extremities no pitting edema  lower extremities Psych normal mood and affect Neuro alert and conversant; oriented x 3 provides hx and follows commands Access: AVF with bruit and thrill   # Acute on chronic hypoxic resp failure - optimize volume with HD  - continue supplemental oxygen   # ESRD - HD today and per MWF Sat schedule  - she has expressed that she would like to speak with palliative care about potentially transitioning off of dialysis at a point in the near future - appreciate their assistance   # HTN  - continue home regimen and optimize volume with HD  # Chronic combined systolic and diastolic CHF  - optimize volume with HD  - states that she can't tolerate over 3.5 kg UF. She is currently on 4 days a week HD  - as above she is expressing an interest in speaking with palliative care  # Anemia CKD  - recently received ESA outpatient   # Metabolic bone disease  - resume binder and continue hectorol   Claudia Desanctis, MD 09/12/2021 2:19 PM

## 2021-09-12 NOTE — ED Notes (Signed)
Pt transported to dialysis accompanied by this RN.

## 2021-09-12 NOTE — Plan of Care (Signed)
°  Problem: Education: Goal: Knowledge of disease or condition will improve Outcome: Progressing   Problem: Respiratory: Goal: Ability to maintain adequate ventilation will improve Outcome: Progressing   Problem: Clinical Measurements: Goal: Ability to maintain clinical measurements within normal limits will improve Outcome: Progressing   Problem: Clinical Measurements: Goal: Respiratory complications will improve Outcome: Progressing   Problem: Clinical Measurements: Goal: Cardiovascular complication will be avoided Outcome: Progressing   Problem: Coping: Goal: Level of anxiety will decrease Outcome: Progressing   Problem: Fluid Volume: Goal: Compliance with measures to maintain balanced fluid volume will improve Outcome: Progressing   Problem: Clinical Measurements: Goal: Complications related to the disease process, condition or treatment will be avoided or minimized Outcome: Progressing

## 2021-09-13 ENCOUNTER — Other Ambulatory Visit (HOSPITAL_COMMUNITY): Payer: Self-pay

## 2021-09-13 ENCOUNTER — Other Ambulatory Visit: Payer: Self-pay

## 2021-09-13 DIAGNOSIS — J9621 Acute and chronic respiratory failure with hypoxia: Principal | ICD-10-CM

## 2021-09-13 LAB — CBC
HCT: 32.6 % — ABNORMAL LOW (ref 36.0–46.0)
Hemoglobin: 9.9 g/dL — ABNORMAL LOW (ref 12.0–15.0)
MCH: 26.8 pg (ref 26.0–34.0)
MCHC: 30.4 g/dL (ref 30.0–36.0)
MCV: 88.3 fL (ref 80.0–100.0)
Platelets: 289 10*3/uL (ref 150–400)
RBC: 3.69 MIL/uL — ABNORMAL LOW (ref 3.87–5.11)
RDW: 16.6 % — ABNORMAL HIGH (ref 11.5–15.5)
WBC: 5.8 10*3/uL (ref 4.0–10.5)
nRBC: 0 % (ref 0.0–0.2)

## 2021-09-13 LAB — BASIC METABOLIC PANEL
Anion gap: 11 (ref 5–15)
BUN: 45 mg/dL — ABNORMAL HIGH (ref 8–23)
CO2: 28 mmol/L (ref 22–32)
Calcium: 8.5 mg/dL — ABNORMAL LOW (ref 8.9–10.3)
Chloride: 94 mmol/L — ABNORMAL LOW (ref 98–111)
Creatinine, Ser: 5.26 mg/dL — ABNORMAL HIGH (ref 0.44–1.00)
GFR, Estimated: 9 mL/min — ABNORMAL LOW (ref >60)
Glucose, Bld: 368 mg/dL — ABNORMAL HIGH (ref 70–99)
Potassium: 3.4 mmol/L — ABNORMAL LOW (ref 3.5–5.1)
Sodium: 133 mmol/L — ABNORMAL LOW (ref 135–145)

## 2021-09-13 LAB — EXPECTORATED SPUTUM ASSESSMENT W GRAM STAIN, RFLX TO RESP C

## 2021-09-13 MED ORDER — LOPERAMIDE HCL 2 MG PO CAPS
2.0000 mg | ORAL_CAPSULE | ORAL | Status: DC | PRN
  Administered 2021-09-13: 2 mg via ORAL
  Filled 2021-09-13: qty 1

## 2021-09-13 MED ORDER — PREDNISONE 20 MG PO TABS
20.0000 mg | ORAL_TABLET | Freq: Every day | ORAL | 0 refills | Status: DC
  Filled 2021-09-13 (×3): qty 4, 4d supply, fill #0

## 2021-09-13 MED ORDER — AMOXICILLIN-POT CLAVULANATE 500-125 MG PO TABS
1.0000 | ORAL_TABLET | Freq: Every day | ORAL | 0 refills | Status: DC
  Filled 2021-09-13 (×2): qty 3, 3d supply, fill #0

## 2021-09-13 MED ORDER — CALCIUM ACETATE (PHOS BINDER) 667 MG PO CAPS: 1334.0000 mg | ORAL_CAPSULE | Freq: Three times a day (TID) | ORAL | 0 refills | Status: DC

## 2021-09-13 MED ORDER — AMOXICILLIN-POT CLAVULANATE 500-125 MG PO TABS: 1.0000 | ORAL_TABLET | Freq: Every day | ORAL | 0 refills | Status: AC

## 2021-09-13 MED ORDER — HYDRALAZINE HCL 25 MG PO TABS
25.0000 mg | ORAL_TABLET | Freq: Three times a day (TID) | ORAL | 0 refills | Status: DC
  Filled 2021-09-13 (×2): qty 90, 30d supply, fill #0

## 2021-09-13 MED ORDER — LOSARTAN POTASSIUM 50 MG PO TABS: 100.0000 mg | ORAL_TABLET | Freq: Every day | ORAL | Status: DC

## 2021-09-13 MED ORDER — HYDRALAZINE HCL 20 MG/ML IJ SOLN
10.0000 mg | INTRAMUSCULAR | Status: DC | PRN
  Administered 2021-09-13: 10 mg via INTRAVENOUS
  Filled 2021-09-13: qty 1

## 2021-09-13 MED ORDER — HYDRALAZINE HCL 25 MG PO TABS
25.0000 mg | ORAL_TABLET | Freq: Three times a day (TID) | ORAL | Status: DC
  Administered 2021-09-13: 25 mg via ORAL
  Filled 2021-09-13: qty 1

## 2021-09-13 MED ORDER — CALCIUM ACETATE (PHOS BINDER) 667 MG PO CAPS
1334.0000 mg | ORAL_CAPSULE | Freq: Three times a day (TID) | ORAL | 0 refills | Status: DC
  Filled 2021-09-13 (×2): qty 180, 30d supply, fill #0

## 2021-09-13 MED ORDER — PREDNISONE 20 MG PO TABS: 20.0000 mg | ORAL_TABLET | Freq: Every day | ORAL | 0 refills | Status: AC

## 2021-09-13 MED ORDER — HYDRALAZINE HCL 25 MG PO TABS: 25.0000 mg | ORAL_TABLET | Freq: Three times a day (TID) | ORAL | 0 refills | Status: DC

## 2021-09-13 NOTE — Progress Notes (Signed)
Patient has confirmed that she will follow up with palliative outpatient. At this time she also declines to have an echo done. Dr. Nolberto Hanlon is aware. Patient is cleared for discharge at this time.

## 2021-09-13 NOTE — TOC Progression Note (Addendum)
Transition of Care [facility]) - Progression Note    Patient Details  Name: Tina Gomez MRN: [id number] Date of Birth: December 03, 1955  Transition of Care [facility]) CM/SW [place], RN Phone Number:[phone]  09/13/2021, 2:57 PM  Clinical Narrative:    CM received message from MD to follow up for outpatient palliative consult. Patient and daughter are agreeable to palliative consult. Outpatient referral for palliative called to [facility]. Referral has been accepted. [facility] is familiar with patient and will reach out to the patient and daughter. Patient and daughter have been updated on the plan for outpatient palliative to follow.   1630 Patient and daughter made aware that [facility] [place] [facility]   Expected Discharge Plan: Home/Self Care Barriers to Discharge: Continued Medical Work up  Expected Discharge Plan and Services Expected Discharge Plan: Home/Self Care In-house Referral: NA Discharge Planning Services: CM Consult Post Acute Care Choice: NA Living arrangements for the past 2 months: Single Family Home                 DME Arranged: N/A DME Agency: NA       HH Arranged: NA HH Agency: NA         Social Determinants of Health (SDOH) Interventions    Readmission Risk Interventions Readmission Risk Prevention Plan 09/13/2021 04/13/2021 07/28/2020  Transportation Screening Complete Complete Complete  PCP or Specialist Appt within 3-5 Days - - Not Complete  Not Complete comments - - first available PCP is 12/16  [place] or Home Care Consult - - Complete  Social Work Consult for [place] Planning/Counseling - - Complete  Palliative Care Screening - - Not Applicable  Medication Review Press photographer) Referral to Pharmacy Complete -  PCP or Specialist appointment within 3-5 days of discharge Complete Complete -  [place] or Home Care Consult Complete Complete -  SW  Recovery Care/Counseling Consult Complete Complete -  Palliative Care Screening Complete Not Applicable -  [place] Not Applicable Not Applicable -  Some recent data might be hidden

## 2021-09-13 NOTE — Progress Notes (Signed)
Sputum sample sent to lab

## 2021-09-13 NOTE — Progress Notes (Addendum)
Pt receives out-pt HD at [facility] on MWFS. In the event pt is unable to d/c today, pt can receive out-pt HD at clinic tomorrow morning if pt arrives by 10:15. On Friday's, pt arrives at 10:15 for 10:45 chair time. On Saturday's, pt arrives at 10:40 for 11:00 chair time. This information provided to attending and nephrologist. Will assist as needed.  Melven Sartorius Renal Navigator  [phone]   Addendum at 2:56 pm: MD confirms pt will d/c to home today. Contacted [facility] and made clinic staff aware pt to d/c today and will resume care tomorrow.

## 2021-09-13 NOTE — Progress Notes (Addendum)
[facility] Progress Note   Subjective: Seen in room, asking to go home. Now on 3L/M. Able to lie in semi fowlers position without SOB. Says she is having diarrhea which a chronic issue with her since starting HD. Appears at baseline.   Objective Vitals:   09/13/21 0206 09/13/21 0306 09/13/21 0427 09/13/21 0611  BP: (!) 187/69 (!) 168/50 (!) 190/74 (!) 158/58  Pulse:  64 66 67  Resp:   18   Temp:   98.3 F (36.8 C)   TempSrc:   Oral   SpO2:  96% 98%   Weight:   81.6 kg   Height:       Physical Exam General: Frail, older female in NAD Heart: S1,S2 RRR. No M/R/G. SR, no ectopy. No JVD.  Lungs: CTAB A/P. Has occasional nonproductive dry cough. No WOB.  Abdomen: NABS, NT Extremities:No LE edema Dialysis Access:L AVF + T/B   Additional Objective Labs: Basic Metabolic Panel: Recent Labs  Lab 09/12/21 0504 09/13/21 0233  NA 137 133*  K 3.7 3.4*  CL 96* 94*  CO2 24 28  GLUCOSE 183* 368*  BUN 70* 45*  CREATININE 7.31* 5.26*  CALCIUM 8.0* 8.5*   Liver Function Tests: Recent Labs  Lab 09/12/21 0504  AST 11*  ALT 9  ALKPHOS 60  BILITOT 0.5  PROT 6.1*  ALBUMIN 2.6*   No results for input(s): LIPASE, AMYLASE in the last 168 hours. CBC: Recent Labs  Lab 09/12/21 0504 09/13/21 0233  WBC 9.8 5.8  NEUTROABS 8.3*  --   HGB 9.2* 9.9*  HCT 31.0* 32.6*  MCV 92.0 88.3  PLT 258 289   Blood Culture No results found for: SDES, SPECREQUEST, CULT, REPTSTATUS  Cardiac Enzymes: No results for input(s): CKTOTAL, CKMB, CKMBINDEX, TROPONINI in the last 168 hours. CBG: No results for input(s): GLUCAP in the last 168 hours. Iron Studies: No results for input(s): IRON, TIBC, TRANSFERRIN, FERRITIN in the last 72 hours. @lablastinr3 @ Studies/Results: DG Chest Port 1 View  Result Date: 09/12/2021 CLINICAL DATA:  Cough, congestion. EXAM: PORTABLE CHEST 1 VIEW COMPARISON:  08/04/2021. FINDINGS: The heart is enlarged and the pulmonary vasculature is mildly  distended. Atherosclerotic calcification of the aorta is noted. Interstitial and airspace opacities are present in the lungs bilaterally. There are likely small bilateral pleural effusions. No pneumothorax. No acute osseous abnormality. IMPRESSION: 1. Cardiomegaly with pulmonary vascular congestion. 2. Interstitial and airspace opacities in the lungs bilaterally, possible edema or infiltrate. 3. Small bilateral pleural effusions. Electronically Signed   By: Brett Fairy M.D.   On: 09/12/2021 04:45   Medications:  ceFEPime (MAXIPIME) IV      (feeding supplement) PROSource Plus  30 mL Oral BID BM   atorvastatin  40 mg Oral q1800   calcium acetate  1,334 mg Oral TID with meals   calcium acetate  667 mg Oral With snacks   carvedilol  12.5 mg Oral BID   Chlorhexidine Gluconate Cloth  6 each Topical Q0600   doxercalciferol  2 mcg Intravenous Q M,W,F-HD   FLUoxetine  20 mg Oral QHS   gabapentin  100 mg Oral QHS   heparin  5,000 Units Subcutaneous Q8H   ipratropium-albuterol  3 mL Nebulization TID   losartan  50 mg Oral QHS   midodrine  10 mg Oral Q M,W,F-HD   multivitamin  1 tablet Oral QHS   predniSONE  40 mg Oral Q breakfast   sodium chloride flush  3 mL Intravenous Q12H  Dialysis Orders: Center: [facility] MWFS 3 hr 180NRe 400/600 75 kg 3.0 K/2.5 Ca UFP 2 AVF -Heparin 5000 units IV initial bolus Heparin 2000 units IV mid run -Hectorol 1 mcg IV TIW -Mircera 225 mcg IV q 2 weeks (last dose 09/06/2021)   Assessment/Plan:  Acute hypoxic Respiratory failure in setting of pulmonary edema. Left HD 3.5 kg last OP Tx. Net UF  Possible HCAP-no leukocytosis or fever. Rec'd ABX in ED. Per primary.  Elevated troponin in ESRD pt: Troponin in 200 range, flat trend. Possibly demand ischemia in setting of pulmonary edema H/O combined systolic/diastolic HF-usually managed with HD. Last EF 30% 11/2020. UF as tolerated. On Carvedilol 12.5 mg PO BID.   ESRD -  MWFS-has NOT missed HD. Next HD 09/14/2021  hopefully at [facility]. Reinforced that she needs to attend HD 09/15/2021 as well per her schedule.   Hypertension/volume  - Hypertensive at present. Resume home meds. Hypertension usually managed by Dr. Moshe Cipro but says PCP recently increased losartan to 100 mg PO q HS. Carvedi  Anemia  - HGB 9.9. Recent OP ESA dose. Follow HGB.   Metabolic bone disease -  H/O noncompliance with binders. Resume Calcium acetate, increase dose to 3 caps PO BID, continue VDRA.   Nutrition - Low albumin. Renal/Carb mod diet with fld restriction, protein supps. DMT2-Per primary.  GOC-daughter requesting Palliative Care Consult D/T patient's poor quality of life and declining mental status. Seen by Hospice but patient is not ready to stop HD yet. Discussed with her that HD is her option and that we will respect her decision to stop when she decides to do so. She says she will talk to myself or Dr. Moshe Cipro if she gets to this point.   Disposition: Stable from Nephrology standpoint for discharge. Will follow at OP clinic. Dr. Moshe Cipro is aware of admission.   Rita H. Brown NP-C 09/13/2021, 9:52 AM  [employer] [phone]   Seen and examined independently.  Agree with note and exam as documented above by physician extender and as noted here.  She feels much better and states she is at her baseline respiratory status. Back on 3 liters.  Her daughter agrees.  She would like to go home.  She does want to continue dialysis at this time   General elderly female in NAD at rest HEENT normocephalic atraumatic extraocular movements intact sclera anicteric Neck supple trachea midline Lungs cough; unlabored; occ wheeze; on 3 liters oxygen  Heart S1S2   Abdomen soft nontender nondistended Extremities no pitting edema lower extremities Psych normal mood and affect Neuro alert and conversant; oriented x 3 provides hx and follows commands Access: AVF LUE with bruit and thrill   # Acute on chronic  hypoxic resp failure - continue four days a week HD - continue supplemental oxygen    # ESRD - HD per MWF Sat schedule  - she has spoken with palliative and does wish to continue with HD at this time   # HTN  - continue home regimen and optimize volume with HD -midodrine is only at HD if needed    # Chronic combined systolic and diastolic CHF  - continue MWF and Sat HD (is on four days a week)   # Anemia CKD  - recently received ESA outpatient    # Metabolic bone disease  - continue binder and continue hectorol outpatient HD (should not be on discharge med list - given with HD)  Disposition per primary team.  She states she  is at respiratory baseline and her daughter agrees.  She would like to go home.  She agrees to go to HD on 1/6 and 1/7 as scheduled but doesn't think she could do a treatment today as well - this would be four days in a row and she feels good currently  Claudia Desanctis, MD 09/13/2021  10:44 AM

## 2021-09-13 NOTE — TOC Initial Note (Signed)
Transition of Care [facility]) - Initial/Assessment Note    Patient Details  Name: Tina Gomez MRN: [id number] Date of Birth: 24-Nov-1955  Transition of Care [facility]) CM/SW Contact:    Angelita Ingles, RN Phone Number:[phone]  09/13/2021, 11:58 AM  Clinical Narrative:                 [facility] consulted for patient with high risk for readmission. Patient and daughter at bedside to offer input on assessment. Patient states she is from home with the daughter and son in law. Patient reports being independent and has a very good support system. Patient verified that PCP is Dr. Trecia Rogers. Per patient daughter provides transportation to all needed appointments. Daughter confirms that patients pharmacy is [facility] and that patient is able to obtain meds without difficulty.  Patient has DME at home ( O2 provided by [employer], wheelchair, rollator, shower chair, adjustable bed & grab bars). Patient and daughter both deny a need for any other DME. TOC will continue to follow for any needs.     Barriers to Discharge: Continued Medical Work up   Patient Goals and CMS Choice        Expected Discharge Plan and Services                                                Prior Living Arrangements/Services                       Activities of Daily Living      Permission Sought/Granted                  Emotional Assessment              Admission diagnosis:  End-stage renal disease on hemodialysis ([place]) [N18.6, Z99.2] HCAP (healthcare-associated pneumonia) [J18.9] Acute on chronic respiratory failure with hypoxia (HCC) [J96.21] Hypervolemia, unspecified hypervolemia type [E87.70] Patient Active Problem List   Diagnosis Date Noted   Acute on chronic respiratory failure with hypoxia ([place]) 09/12/2021   HCAP (healthcare-associated pneumonia) 09/12/2021   COPD with acute exacerbation ([place]) 09/12/2021   Goals of care, counseling/discussion 96/29/5284    Metabolic acidosis, increased anion gap (IAG) 08/03/2021   Acute right-sided weakness 08/03/2021   Anxiety and depression 13/24/4010   Chronic systolic CHF (congestive heart failure) ([place]) 08/03/2021   ESRD on hemodialysis ([place]) 04/05/2021   Generalized weakness 04/05/2021   Chronic respiratory failure ([place]) 04/04/2021   Hypervolemia associated with renal insufficiency 12/13/2020   CAD (coronary artery disease) 12/13/2020   CHF (congestive heart failure) ([place]) 11/07/2020   Protein-calorie malnutrition, severe 07/28/2020   Diabetic gastroparesis (HCC)    Abnormal finding on GI tract imaging    H. pylori infection    Anemia 05/24/2020   Tobacco abuse 05/24/2020   Emphysema of lung ([place]) 05/24/2020   Psoriasis 02/14/2016   Type 2 diabetes mellitus with diabetic neuropathy, unspecified ([place]) 01/18/2014   Essential hypertension, benign 01/18/2014   Hyperlipidemia 01/18/2014   History of CVA (cerebrovascular accident) 10/22/2013   Malignant hypertension 10/22/2013   PCP:  Charlott Rakes, MD Pharmacy:   [facility] [address] [place] [address]   [facility] - [place], [place] - [address], [address] [place] [address]  Phone: [phone] Fax: [phone]     Social Determinants of Health (SDOH) Interventions    Readmission Risk Interventions Readmission Risk Prevention Plan 09/13/2021 04/13/2021 07/28/2020  Transportation Screening Complete Complete Complete  PCP or Specialist Appt within 3-5 Days - - Not Complete  Not Complete comments - - first available PCP is 12/16  [place] or [place] - - Complete  Social Work Consult for [place] Planning/Counseling - - Complete  Palliative Care Screening - - Not Applicable  Medication Review Press photographer) Referral to Pharmacy Complete -  PCP or Specialist appointment within 3-5 days of discharge  Complete Complete -  [place] or Home Care Consult Complete Complete -  SW Recovery Care/Counseling Consult Complete Complete -  Palliative Care Screening Complete Not Applicable -  [place] Not Applicable Not Applicable -  Some recent data might be hidden

## 2021-09-13 NOTE — Discharge Summary (Signed)
Tina Gomez PQD:[id number] DOB: 1955/12/22 DOA: 09/12/2021  PCP: Tina Rakes, MD  Admit date: 09/12/2021 Discharge date: 09/13/2021  Admitted From: Home Disposition: Home  Recommendations for Outpatient Follow-up:  Follow up with PCP in 1 week Please obtain BMP/CBC in one week Please follow up cardiology in 1 week, will need outpatient echo Follow-up with dialysis as scheduled Home Health: Palliative care   Discharge Condition:Stable CODE STATUS: DNR Diet recommendation: Renal diet    Brief/Interim Summary: Per NMM:HWKGS Tina Gomez is a 66 y.o. 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  She called her daughter and said she couldn't breathe.  Despite 5L home O2, she was in the 80s.  They did a breathing treatment without improvement and so called 911. MOST form is completed with goal for comfort measures, DNR.CXR to have interstitial and airspace opacities bilaterally.  BNP is 1910 and initial troponin 266.  1st deg AV block and borderline repolarization abnormality on EKG. in the ER She was found to be saturating 82% on her home O2 with EMS and was treated with 125 mg IV Solu-Medrol and brought into the ED on nonrebreather.  Saturating upper 90s on 6 L/min in the ED .  She was taken to dialysis on the day of admission.  Nephrology was consulted.  Today she is feeling better and at her baseline 3 L nasal cannula.  Initially they wanted to discuss stopping dialysis and consider hospice but then they realized they are not ready for that and they want palliative to follow-up as outpatient. Her troponin was elevated on admission likely due to demand ischemia due to CHF.  Was going to repeat echo here but patient declined and would like to follow-up cardiologist as outpatient as she feels it was not going to change management. She was also treated for COPD exacerbation with steroid taper  and IV antibiotics, transition to p.o. on discharge.  Her blood pressure was elevated he was started on hydralazine.  She will need to follow-up with her primary care or Dr. Moshe Gomez for BP control.     Discharge Diagnoses:  Principal Problem:   Acute on chronic respiratory failure with hypoxia (HCC) Active Problems:   Type 2 diabetes mellitus with diabetic neuropathy, unspecified (HCC)   Essential hypertension, benign   CAD (coronary artery disease)   ESRD on hemodialysis ([place])   HCAP (healthcare-associated pneumonia)   COPD with acute exacerbation ([place])   Goals of care, counseling/discussion    Discharge Instructions  Discharge Instructions     Call MD for:  difficulty breathing, headache or visual disturbances   Complete by: As directed    Diet - low sodium heart healthy   Complete by: As directed    Discharge instructions   Complete by: As directed    Take antibiotic on dialysis days after hemodialysis   Increase activity slowly   Complete by: As directed       Allergies as of 09/13/2021       Reactions   Codeine Nausea And Vomiting   Adhesive [tape] Other (See Comments)   Tape breaks out the skin if it is left on for a lengthy period of time        Medication List     STOP taking these medications    midodrine 10 MG tablet Commonly known as: PROAMATINE       TAKE these medications  acetaminophen 500 MG tablet Commonly known as: TYLENOL Take 1,000 mg by mouth every 6 (six) hours as needed for mild pain or headache.   Ventolin HFA 108 (90 Base) MCG/ACT inhaler Generic drug: albuterol Inhale 2 puffs into the lungs every 6 (six) hours as needed for wheezing or shortness of breath.   albuterol (2.5 MG/3ML) 0.083% nebulizer solution Commonly known as: PROVENTIL Take 3 mLs (2.5 mg total) by nebulization every 6 (six) hours as needed for wheezing or shortness of breath.   amoxicillin-clavulanate 500-125 MG tablet Commonly known as:  Augmentin Take 1 tablet (500 mg total) by mouth daily for 3 days.   atorvastatin 40 MG tablet Commonly known as: LIPITOR Take 1 tablet (40 mg total) by mouth daily at 6 PM.   blood glucose meter kit and supplies Use as instructed   True Metrix Meter Devi 1 each by Does not apply route 3 (three) times daily before meals.   calcium acetate 667 MG capsule Commonly known as: PHOSLO Take 2 capsules (1,334 mg total) by mouth with breakfast, with lunch, and with evening meal. What changed:  how much to take when to take this additional instructions   carvedilol 12.5 MG tablet Commonly known as: COREG Take 1 tablet (12.5 mg total) by mouth 2 (two) times daily with a meal. What changed: when to take this   DIALYVITE 800 WITH ZINC 0.8 MG Tabs Take 1 tablet by mouth at bedtime.   FLUoxetine 20 MG capsule Commonly known as: PROzac Take 1 capsule (20 mg total) by mouth daily. What changed: when to take this   freestyle lancets Use as instructed   TRUEplus Lancets 28G Misc 1 each by Does not apply route 3 (three) times daily before meals.   gabapentin 100 MG capsule Commonly known as: NEURONTIN Take 1 capsule (100 mg total) by mouth at bedtime.   hydrALAZINE 25 MG tablet Commonly known as: APRESOLINE Take 1 tablet (25 mg total) by mouth every 8 (eight) hours.   lidocaine-prilocaine cream Commonly known as: EMLA Apply 1 application topically See admin instructions. For use on dialysis days- fistula (left arm)   loperamide 2 MG tablet Commonly known as: IMODIUM A-D Take 2 mg by mouth 4 (four) times daily as needed for diarrhea or loose stools.   losartan 100 MG tablet Commonly known as: COZAAR Take 1 tablet (100 mg total) by mouth every evening. What changed:  when to take this additional instructions   Misc. Devices Misc Large depends. Diagnosis - fecal incontinence   Misc. Devices Misc Rolling walker with seat   Misc. Devices Misc Wheelchair with Accessories:  elevating leg rests (ELRs), wheel locks, extensions and anti-tippers. Cane or walker will not suffice   Misc. Devices Misc Portable oxygen concentrator. Chronic respiratory failure - 4L oxygen qhs and prn   ondansetron 4 MG tablet Commonly known as: Zofran Take 1 tablet (4 mg total) by mouth every 8 (eight) hours as needed for nausea or vomiting.   OXYGEN Inhale 4 L/min into the lungs continuous.   predniSONE 20 MG tablet Commonly known as: DELTASONE Take 1 tablet (20 mg total) by mouth daily with breakfast for 4 doses. Start taking on: September 14, 2021   Spiriva Respimat 2.5 MCG/ACT Aers Generic drug: Tiotropium Bromide Monohydrate Inhale 2 puffs into the lungs daily.   TRUEtest Test test strip Generic drug: glucose blood Use as instructed        Follow-up Information     [facility] Follow up.  Why: Your palliative care consult has been set up for outpatient. The office will follow up with you. Please call number listed above for any questions or concerns. Contact information: [place]        Lelon Perla, MD Follow up in 1 week(s).   Specialty: Cardiology Contact information: [address] [place] [address] [phone]         Tina Rakes, MD Follow up in 1 week(s).   Specialty: Family Medicine Contact information: [place] [address] [phone]                Allergies  Allergen Reactions   Codeine Nausea And Vomiting   Adhesive [Tape] Other (See Comments)    Tape breaks out the skin if it is left on for a lengthy period of time    Consultations: Palliative care, hospice, nephrology   Procedures/Studies: DG Chest Port 1 View  Result Date: 09/12/2021 CLINICAL DATA:  Cough, congestion. EXAM: PORTABLE CHEST 1 VIEW COMPARISON:  08/04/2021. FINDINGS: The heart is enlarged and the pulmonary vasculature is mildly distended.  Atherosclerotic calcification of the aorta is noted. Interstitial and airspace opacities are present in the lungs bilaterally. There are likely small bilateral pleural effusions. No pneumothorax. No acute osseous abnormality. IMPRESSION: 1. Cardiomegaly with pulmonary vascular congestion. 2. Interstitial and airspace opacities in the lungs bilaterally, possible edema or infiltrate. 3. Small bilateral pleural effusions. Electronically Signed   By: Brett Fairy M.D.   On: 09/12/2021 04:45      Subjective: Feels better.  Feels shortness of breath at baseline.  Back on 3 L.  Discharge Exam: Vitals:   09/13/21 1500 09/13/21 1503  BP:  (!) 188/69  Pulse: 64 64  Resp:    Temp:    SpO2: 98% 97%   Vitals:   09/13/21 1400 09/13/21 1420 09/13/21 1500 09/13/21 1503  BP:  (!) 199/67  (!) 188/69  Pulse: 64 64 64 64  Resp:  16    Temp:  97.7 F (36.5 C)    TempSrc:  Oral    SpO2: 97% 98% 98% 97%  Weight:      Height:        General: Pt is alert, awake, not in acute distress Cardiovascular: RRR, S1/S2 +, no rubs, no gallops Respiratory: Decreased breath sounds, no wheezing or rhonchi's Abdominal: Soft, NT, ND, bowel sounds + Extremities: no edema    The results of significant diagnostics from this hospitalization (including imaging, microbiology, ancillary and laboratory) are listed below for reference.     Microbiology: Recent Results (from the past 240 hour(s))  Resp Panel by RT-PCR (Flu A&B, Covid) Nasopharyngeal Swab     Status: None   Collection Time: 09/12/21  5:50 AM   Specimen: Nasopharyngeal Swab; Nasopharyngeal(NP) swabs in vial transport medium  Result Value Ref Range Status   SARS Coronavirus 2 by RT PCR NEGATIVE NEGATIVE Final    Comment: (NOTE) SARS-CoV-2 target nucleic acids are NOT DETECTED.  The SARS-CoV-2 RNA is generally detectable in upper respiratory specimens during the acute phase of infection. The lowest concentration of SARS-CoV-2 viral copies this assay  can detect is 138 copies/mL. A negative result does not preclude SARS-Cov-2 infection and should not be used as the sole basis for treatment or other patient management decisions. A negative result may occur with  improper specimen collection/handling, submission of specimen other than nasopharyngeal swab, presence of viral mutation(s) within the areas targeted  by this assay, and inadequate number of viral copies(<138 copies/mL). A negative result must be combined with clinical observations, patient history, and epidemiological information. The expected result is Negative.  Fact Sheet for Patients:  EntrepreneurPulse.com.au  Fact Sheet for Healthcare Providers:  IncredibleEmployment.be  This test is no t yet approved or cleared by the [place] FDA and  has been authorized for detection and/or diagnosis of SARS-CoV-2 by FDA under an Emergency Use Authorization (EUA). This EUA will remain  in effect (meaning this test can be used) for the duration of the COVID-19 declaration under Section 564(b)(1) of the Act, 21 U.S.C.section 360bbb-3(b)(1), unless the authorization is terminated  or revoked sooner.       Influenza A by PCR NEGATIVE NEGATIVE Final   Influenza B by PCR NEGATIVE NEGATIVE Final    Comment: (NOTE) The Xpert Xpress SARS-CoV-2/FLU/RSV plus assay is intended as an aid in the diagnosis of influenza from Nasopharyngeal swab specimens and should not be used as a sole basis for treatment. Nasal washings and aspirates are unacceptable for Xpert Xpress SARS-CoV-2/FLU/RSV testing.  Fact Sheet for Patients: EntrepreneurPulse.com.au  Fact Sheet for Healthcare Providers: IncredibleEmployment.be  This test is not yet approved or cleared by the [place] FDA and has been authorized for detection and/or diagnosis of SARS-CoV-2 by FDA under an Emergency Use Authorization (EUA). This EUA will  remain in effect (meaning this test can be used) for the duration of the COVID-19 declaration under Section 564(b)(1) of the Act, 21 U.S.C. section 360bbb-3(b)(1), unless the authorization is terminated or revoked.  Performed at [facility], [place] [address] [place], [place] [address]   Expectorated Sputum Assessment w Gram Stain, Rflx to Resp Cult     Status: None   Collection Time: 09/13/21  4:59 AM   Specimen: Expectorated Sputum  Result Value Ref Range Status   Specimen Description EXPECTORATED SPUTUM  Final   Special Requests NONE  Final   Sputum evaluation   Final    THIS SPECIMEN IS ACCEPTABLE FOR SPUTUM CULTURE Performed at [facility], [place] [address], [place], [place] [address]    Report Status 09/13/2021 FINAL  Final  Culture, Respiratory w Gram Stain     Status: None (Preliminary result)   Collection Time: 09/13/21  4:59 AM  Result Value Ref Range Status   Specimen Description EXPECTORATED SPUTUM  Final   Special Requests NONE Reflexed from [id number]  Final   Gram Stain   Final    NO WBC SEEN FEW GRAM POSITIVE COCCI IN PAIRS RARE GRAM NEGATIVE RODS Performed at [facility], [address] [place], [place] [address]    Culture PENDING  Incomplete   Report Status PENDING  Incomplete     Labs: BNP (last 3 results) Recent Labs    04/10/21 0424 08/03/21 2248 09/12/21 0504  BNP 2,681.5* 1,684.3* 7,124.5*   Basic Metabolic Panel: Recent Labs  Lab 09/12/21 0504 09/13/21 0233  NA 137 133*  K 3.7 3.4*  CL 96* 94*  CO2 24 28  GLUCOSE 183* 368*  BUN 70* 45*  CREATININE 7.31* 5.26*  CALCIUM 8.0* 8.5*   Liver Function Tests: Recent Labs  Lab 09/12/21 0504  AST 11*  ALT 9  ALKPHOS 60  BILITOT 0.5  PROT 6.1*  ALBUMIN 2.6*   No results for input(s): LIPASE, AMYLASE in the last 168 hours. No results for input(s): AMMONIA in the last 168 hours. CBC: Recent Labs  Lab 09/12/21 0504 09/13/21 0233  WBC 9.8 5.8  NEUTROABS 8.3*  --    HGB 9.2* 9.9*  HCT 31.0* 32.6*  MCV 92.0 88.3  PLT 258 289   Cardiac Enzymes: No results for input(s): CKTOTAL, CKMB, CKMBINDEX, TROPONINI in the last 168 hours. BNP: Invalid input(s): POCBNP CBG: No results for input(s): GLUCAP in the last 168 hours. D-Dimer No results for input(s): DDIMER in the last 72 hours. Hgb A1c No results for input(s): HGBA1C in the last 72 hours. Lipid Profile No results for input(s): CHOL, HDL, LDLCALC, TRIG, CHOLHDL, LDLDIRECT in the last 72 hours. Thyroid function studies No results for input(s): TSH, T4TOTAL, T3FREE, THYROIDAB in the last 72 hours.  Invalid input(s): FREET3 Anemia work up No results for input(s): VITAMINB12, FOLATE, FERRITIN, TIBC, IRON, RETICCTPCT in the last 72 hours. Urinalysis    Component Value Date/Time   COLORURINE YELLOW 07/23/2020 0146   APPEARANCEUR CLOUDY (A) 07/23/2020 0146   LABSPEC 1.010 07/23/2020 0146   PHURINE 5.0 07/23/2020 0146   GLUCOSEU 50 (A) 07/23/2020 0146   HGBUR MODERATE (A) 07/23/2020 0146   BILIRUBINUR NEGATIVE 07/23/2020 0146   KETONESUR NEGATIVE 07/23/2020 0146   PROTEINUR >=300 (A) 07/23/2020 0146   UROBILINOGEN 1.0 10/22/2013 1626   NITRITE NEGATIVE 07/23/2020 0146   LEUKOCYTESUR NEGATIVE 07/23/2020 0146   Sepsis Labs Invalid input(s): PROCALCITONIN,  WBC,  LACTICIDVEN Microbiology Recent Results (from the past 240 hour(s))  Resp Panel by RT-PCR (Flu A&B, Covid) Nasopharyngeal Swab     Status: None   Collection Time: 09/12/21  5:50 AM   Specimen: Nasopharyngeal Swab; Nasopharyngeal(NP) swabs in vial transport medium  Result Value Ref Range Status   SARS Coronavirus 2 by RT PCR NEGATIVE NEGATIVE Final    Comment: (NOTE) SARS-CoV-2 target nucleic acids are NOT DETECTED.  The SARS-CoV-2 RNA is generally detectable in upper respiratory specimens during the acute phase of infection. The lowest concentration of SARS-CoV-2 viral copies this assay can detect is 138 copies/mL. A negative  result does not preclude SARS-Cov-2 infection and should not be used as the sole basis for treatment or other patient management decisions. A negative result may occur with  improper specimen collection/handling, submission of specimen other than nasopharyngeal swab, presence of viral mutation(s) within the areas targeted by this assay, and inadequate number of viral copies(<138 copies/mL). A negative result must be combined with clinical observations, patient history, and epidemiological information. The expected result is Negative.  Fact Sheet for Patients:  EntrepreneurPulse.com.au  Fact Sheet for Healthcare Providers:  IncredibleEmployment.be  This test is no t yet approved or cleared by the [place] FDA and  has been authorized for detection and/or diagnosis of SARS-CoV-2 by FDA under an Emergency Use Authorization (EUA). This EUA will remain  in effect (meaning this test can be used) for the duration of the COVID-19 declaration under Section 564(b)(1) of the Act, 21 U.S.C.section 360bbb-3(b)(1), unless the authorization is terminated  or revoked sooner.       Influenza A by PCR NEGATIVE NEGATIVE Final   Influenza B by PCR NEGATIVE NEGATIVE Final    Comment: (NOTE) The Xpert Xpress SARS-CoV-2/FLU/RSV plus assay is intended as an aid in the diagnosis of influenza from Nasopharyngeal swab specimens and should not be used as a sole basis for treatment. Nasal washings and aspirates are unacceptable for Xpert Xpress SARS-CoV-2/FLU/RSV testing.  Fact Sheet for Patients: EntrepreneurPulse.com.au  Fact Sheet for Healthcare Providers: IncredibleEmployment.be  This test is not yet approved or cleared by the [place] and has been authorized for  detection and/or diagnosis of SARS-CoV-2 by FDA under an Emergency Use Authorization (EUA). This EUA will remain in effect (meaning this test can be used)  for the duration of the COVID-19 declaration under Section 564(b)(1) of the Act, 21 U.S.C. section 360bbb-3(b)(1), unless the authorization is terminated or revoked.  Performed at [facility], [place] [address], [place], [place] [address]   Expectorated Sputum Assessment w Gram Stain, Rflx to Resp Cult     Status: None   Collection Time: 09/13/21  4:59 AM   Specimen: Expectorated Sputum  Result Value Ref Range Status   Specimen Description EXPECTORATED SPUTUM  Final   Special Requests NONE  Final   Sputum evaluation   Final    THIS SPECIMEN IS ACCEPTABLE FOR SPUTUM CULTURE Performed at [facility], [place] [address], [place], [place] [address]    Report Status 09/13/2021 FINAL  Final  Culture, Respiratory w Gram Stain     Status: None (Preliminary result)   Collection Time: 09/13/21  4:59 AM  Result Value Ref Range Status   Specimen Description EXPECTORATED SPUTUM  Final   Special Requests NONE Reflexed from [id number]  Final   Gram Stain   Final    NO WBC SEEN FEW GRAM POSITIVE COCCI IN PAIRS RARE GRAM NEGATIVE RODS Performed at [facility], [address] [place],  [address]    Culture PENDING  Incomplete   Report Status PENDING  Incomplete     Time coordinating discharge: Over 30 minutes  SIGNED:   Nolberto Hanlon, MD  [facility] 09/13/2021, 3:44 PM Pager   If 7PM-7AM, please contact night-coverage [email or web address] Password [email or web address]

## 2021-09-13 NOTE — Progress Notes (Signed)
[facility] ([facility]) Hospital Liaison Note  Notified by TOC manager of patient/family request for [facility] palliative services at home after discharge.   [facility] hospital liaison will follow patient for discharge disposition.   Please call with any hospice or outpatient palliative care related questions.   Thank you for the opportunity to participate in this patient's care.   Shanita Wicker, LCSW [facility] Hospital Liaison [phone]  

## 2021-09-14 ENCOUNTER — Telehealth: Payer: Self-pay | Admitting: Nephrology

## 2021-09-14 ENCOUNTER — Telehealth: Payer: Self-pay

## 2021-09-14 ENCOUNTER — Other Ambulatory Visit (HOSPITAL_COMMUNITY): Payer: Self-pay

## 2021-09-14 NOTE — Telephone Encounter (Signed)
Transition Care Management Follow-up Telephone Call Date of discharge and from where: 09/13/2021-[facility] How have you been since you were released from the hospital? Patient stated she is doing fine, about to go to Dialysis.  Any questions or concerns? No  Items Reviewed: Did the pt receive and understand the discharge instructions provided? Yes  Medications obtained and verified? Yes  Other? No  Any new allergies since your discharge? No  Dietary orders reviewed? No Do you have support at home? Yes   Home Care and Equipment/Supplies: Were home health services ordered? not applicable If so, what is the name of the agency? N/A  Has the agency set up a time to come to the patient's home? not applicable Were any new equipment or medical supplies ordered?  No What is the name of the medical supply agency? N/A Were you able to get the supplies/equipment? not applicable Do you have any questions related to the use of the equipment or supplies? No  Functional Questionnaire: (I = Independent and D = Dependent) ADLs: I  Bathing/Dressing- I  Meal Prep- I  Eating- I  Maintaining continence- I  Transferring/Ambulation- I  Managing Meds- I  Follow up appointments reviewed:  PCP Hospital f/u appt confirmed? No   Specialist Hospital f/u appt confirmed? No   Are transportation arrangements needed? No  If their condition worsens, is the pt aware to call PCP or go to the Emergency Dept.? Yes Was the patient provided with contact information for the PCP's office or ED? Yes Was to pt encouraged to call back with questions or concerns? Yes

## 2021-09-14 NOTE — Telephone Encounter (Signed)
Transition of Care Contact from [place]   Date of Discharge: 09/13/21 Date of Contact: 09/14/21 Method of contact: phone Talked to patient and daughter   Patient contacted to discuss transition of care form recent hospitaliztion. Patient was admitted to [facility] from 09/12/21 to 09/13/21 with the discharge diagnosis of Acute chronic respiratory failure w/hypoxia.     Medication changes were reviewed. Patient and daughter concerned about addition of hydralazine 50mg  TID to BP regimen at discharge due to Dr. Moshe Cipro managing HTN medications.  Reported good BP today without taking it.  Advised to closely monitor BP over weekend and take if BP becomes uncontrolled.  Otherwise can follow up with Dr. Moshe Cipro next week at HD.    Patient will follow up with is outpatient dialysis center today 09/14/21.  Other follow up needs include none identified.   Jen Mow, PA-C [facility] Pager: [phone]

## 2021-09-15 LAB — CULTURE, RESPIRATORY W GRAM STAIN
Culture: NORMAL
Gram Stain: NONE SEEN

## 2021-09-17 ENCOUNTER — Other Ambulatory Visit (HOSPITAL_COMMUNITY): Payer: Self-pay

## 2021-09-17 MED ORDER — FLUCONAZOLE 150 MG PO TABS
150.0000 mg | ORAL_TABLET | Freq: Once | ORAL | 0 refills | Status: AC
  Filled 2021-09-17: qty 1, 1d supply, fill #0

## 2021-09-18 ENCOUNTER — Telehealth: Payer: Self-pay

## 2021-09-18 ENCOUNTER — Other Ambulatory Visit: Payer: Self-pay

## 2021-09-18 NOTE — Telephone Encounter (Signed)
Contacted pt to schedule Medicare Wellness pt didn't answer lvm   °

## 2021-09-19 ENCOUNTER — Telehealth: Payer: Self-pay

## 2021-09-19 NOTE — Telephone Encounter (Signed)
Spoke with patient's daughter [name] and scheduled an in-person Palliative Consult for 10/09/21 @ 1:30PM with Dr. Hollace Kinnier. Documentation will be noted in [place].   COVID screening was negative. Cats in the home. Patient lives with daughter. Patient has dialysis Mon, Wed, Fri, and Sat.   Consent obtained; updated Outlook/Netsmart/Team List and Epic.   Family is aware they may be receiving a call from provider the day before or day of to confirm appointment.

## 2021-09-25 ENCOUNTER — Other Ambulatory Visit: Payer: Self-pay | Admitting: Family Medicine

## 2021-09-26 ENCOUNTER — Other Ambulatory Visit: Payer: Self-pay

## 2021-09-26 MED ORDER — LOPERAMIDE HCL 2 MG PO TABS
2.0000 mg | ORAL_TABLET | Freq: Four times a day (QID) | ORAL | 1 refills | Status: DC | PRN
  Filled 2021-09-26 (×2): qty 30, 8d supply, fill #0

## 2021-09-27 ENCOUNTER — Other Ambulatory Visit: Payer: Self-pay

## 2021-09-28 ENCOUNTER — Other Ambulatory Visit: Payer: Self-pay | Admitting: Family Medicine

## 2021-09-28 ENCOUNTER — Other Ambulatory Visit: Payer: Self-pay

## 2021-09-28 DIAGNOSIS — G2581 Restless legs syndrome: Secondary | ICD-10-CM

## 2021-10-01 ENCOUNTER — Other Ambulatory Visit: Payer: Self-pay

## 2021-10-01 MED ORDER — GABAPENTIN 100 MG PO CAPS
100.0000 mg | ORAL_CAPSULE | Freq: Two times a day (BID) | ORAL | 0 refills | Status: DC
  Filled 2021-10-01: qty 60, 30d supply, fill #0

## 2021-10-05 ENCOUNTER — Other Ambulatory Visit: Payer: Self-pay

## 2021-10-09 ENCOUNTER — Other Ambulatory Visit: Payer: Self-pay

## 2021-10-09 ENCOUNTER — Encounter: Payer: Self-pay | Admitting: Internal Medicine

## 2021-10-09 ENCOUNTER — Other Ambulatory Visit: Payer: Medicare Other | Admitting: Internal Medicine

## 2021-10-09 VITALS — HR 61

## 2021-10-09 DIAGNOSIS — I5022 Chronic systolic (congestive) heart failure: Secondary | ICD-10-CM

## 2021-10-09 DIAGNOSIS — E1143 Type 2 diabetes mellitus with diabetic autonomic (poly)neuropathy: Secondary | ICD-10-CM

## 2021-10-09 DIAGNOSIS — J9611 Chronic respiratory failure with hypoxia: Secondary | ICD-10-CM

## 2021-10-09 DIAGNOSIS — N186 End stage renal disease: Secondary | ICD-10-CM

## 2021-10-09 DIAGNOSIS — Z515 Encounter for palliative care: Secondary | ICD-10-CM

## 2021-10-09 DIAGNOSIS — K3184 Gastroparesis: Secondary | ICD-10-CM

## 2021-10-09 DIAGNOSIS — Z992 Dependence on renal dialysis: Secondary | ICD-10-CM

## 2021-10-09 NOTE — Progress Notes (Signed)
[facility] Palliative Care Consult Note Telephone: [phone]  Fax: [phone]   Date of encounter: 10/09/21 12:26 PM PATIENT NAME: Tina Gomez [address] [place] [address]   [phone] (home)  DOB: [birth year] MRN: [id number] PRIMARY CARE PROVIDER:    Charlott Rakes, MD,  [place] [address] [phone]  REFERRING PROVIDER:   Charlott Rakes, MD [address] [place],  [place] [address] [phone]  RESPONSIBLE PARTY:    Contact Information     Name Relation Home Work [place] Daughter [phone]  [phone]        I met face to face with patient and family in daughter, Tina Gomez's home. Palliative Care was asked to follow this patient by consultation request of  Tina Rakes, MD to address advance care planning and complex medical decision making. This is the initial visit.                                     ASSESSMENT AND PLAN / RECOMMENDATIONS:   Advance Care Planning/Goals of Care: Goals include to maximize quality of life and symptom management. Patient/health care surrogate gave his/her permission to discuss.Our advance care planning conversation included a discussion about:    The value and importance of advance care planning  Experiences with loved ones who have been seriously ill or have died  Exploration of personal, cultural or spiritual beliefs that might influence medical decisions  Exploration of goals of care in the event of a sudden injury or illness  Identification  of a healthcare --Tina Gomez, her daughter, is her HCPOA and takes her to appts.  Has living will. Pt also has son who is 56 and autistic--she has been his guardian since 69 and she signed that over to [place] She feels blessed that she's been able to have time to plan.   Review and updating or creation of an  advance directive document . Decision not to resuscitate or to de-escalate disease focused  treatments due to poor prognosis. CODE STATUS: Has DNR.  Right now, if she gets pneumonia, she would want antibiotics. Dialysis days are really bad--4 days per week--on those days, she does not treatment.   Wants to be here as long as she can for kids and grandkids--does not want to be here to lay in a bed and suffer.   She is not yet ready for hospice.   She would prefer to die in the hospice house instead of at home if that is a choice.  She does not want her son or grandchildren to go through with it.   Tells me about an experience with a friend of hers who died in the hospice home peacefully. MOST completed:  DNR, limited additional interventions at this time, abx if indicated, IVF trial, no feeding tube.  Symptom Management/Plan: 1. ESRD on hemodialysis ([place]) -continues 4 3hr sessions m/w/f/sat -struggles with hypotension and syncope near the end and feels bad on HD days, but not ready to stop HD at this time--see above for when she would consider this  2. 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 Chronic respiratory failure with hypoxia (HCC) -continue oxygen at 4L, albuerol nebs, spiriva, ventolin hfa for copd; she would not want to be placed on  a ventilator  4. Diabetic gastroparesis ([place]) -got under control with course of reglan but no longer on this  -reportedly this led to her renal failure  5. Palliative care by specialist -ACP performed as above for 46 mins -MOST completed and will be uploaded to vynca  Follow up Palliative Care Visit: Palliative care will continue to follow for complex medical decision making, advance care planning, and clarification of goals. Return 4 weeks or prn.  This visit was coded based on medical decision making (MDM).46 mins on acp.  PPS: 50%  HOSPICE ELIGIBILITY/DIAGNOSIS:  if/when stops HD/ESRD on HD   Chief  Complaint: new palliative consult  HISTORY OF PRESENT ILLNESS:  Tina Gomez is a 66 y.o. year old female  with ESRD on HD m/w/f/s, DMII well controlled now, neuropathy, htn, COPD, chronic respiratory failure with hypoxia, hyperlipidemia, depression among others.   Pt had gastroparesis that was treated with reglan which helped .  Unfortunately, she then turned out to be in renal failure.  She also has heart failure with reduced EF.  She has COPD.  She lost 75 lbs initially from the vomiting.  Now on HD a year ago in November.  Started off at 3 days, but was passing out with hypotension the last hour.  So ended up changing to 4 days a week at 3 hrs instead of 3 days at 4 hrs.  Still has hypotension at times--has to take midodrine sometimes like yesterday in the last 13 mins.  BP went down to 73/27.    Talks about witnessing a patient getting cpr at dialysis.  Three died in 2 weeks which scared her.    Has been told she has long covid.    If all of her dialysis days are like yesterday, she does not know how long she can stand it.  Talks about quality vs quantity.  She doesn't want to suffer or her kids to see her suffer.    Tina Gomez notices she sleeps a lot more.  She can't walk bed to bathroom w/o her O2.  She's fallen and can't walk well.  Furniture surfs.  Lives in three meals.  Tina Gomez fixes the meals.  Grandkids wait on her.  She goes to appt in wheelchair.  Can walk a few feet into HD with rollator, but might get rolled out afterwards.  Goes to bed after HD.  Sleeps 3-4 hrs, eats something, then functions.  Has trouble sleeping at night.  Sometimes sleeps at HD.  Uses electric blanket there that's rechargeable.    History obtained from review of EMR, discussion with primary team, and interview with family, facility staff/caregiver and/or Tina Gomez.  I reviewed available labs, medications, imaging, studies and related documents from the EMR.  Records reviewed and summarized above.   ROS  General:  NAD, fatigued EYES: denies vision changes ENMT: denies dysphagia Cardiovascular: denies chest pain, has DOE Pulmonary: has barking cough, has increased SOB Abdomen: endorses fair appetite, denies constipation, endorses continence of bowel GU: denies dysuria, makes only drops of urine MSK: hjas increased weakness,  no falls reported--furniture surfs Skin: denies rashes or wounds Neurological: denies pain, denies insomnia Psych: Endorses positive mood Heme/lymph/immuno: denies bruises, abnormal bleeding  Physical Exam: Current and past weights:179.9 lbs 09/13/21 BMI 31.87 Constitutional: NAD General: WNWD, pallor EYES: anicteric sclera, lids intact, no discharge, right eye droop ENMT: intact hearing, oral mucous membranes moist CV: S1S2, RRR, no LE edema Pulmonary: LCTA, poor air movement, barking cough, 3L [place] Abdomen: intake  100%, normo-active BS + 4 quadrants, soft and non tender, no ascites GU: deferred MSK: mild sarcopenia, moves all extremities, ambulatory Skin: warm and dry, no rashes or wounds on visible skin Neuro:  has generalized weakness,  no cognitive impairment Psych: non-anxious affect, A and O x 3 Hem/lymph/immuno: no widespread bruising  CURRENT PROBLEM LIST:  Patient Active Problem List   Diagnosis Date Noted   Acute on chronic respiratory failure with hypoxia ([place]) 09/12/2021   HCAP (healthcare-associated pneumonia) 09/12/2021   COPD with acute exacerbation ([place]) 09/12/2021   Goals of care, counseling/discussion 81/44/8185   Metabolic acidosis, increased anion gap ([facility]) 08/03/2021   Acute right-sided weakness 08/03/2021   Anxiety and depression 63/14/9702   Chronic systolic CHF (congestive heart failure) ([place]) 08/03/2021   ESRD on hemodialysis ([place]) 04/05/2021   Generalized weakness 04/05/2021   Chronic respiratory failure ([place]) 04/04/2021   Hypervolemia associated with renal insufficiency 12/13/2020   CAD (coronary artery disease) 12/13/2020   CHF  (congestive heart failure) ([place]) 11/07/2020   Protein-calorie malnutrition, severe 07/28/2020   Diabetic gastroparesis (HCC)    Abnormal finding on GI tract imaging    H. pylori infection    Anemia 05/24/2020   Tobacco abuse 05/24/2020   Emphysema of lung ([place]) 05/24/2020   Psoriasis 02/14/2016   Type 2 diabetes mellitus with diabetic neuropathy, unspecified ([place]) 01/18/2014   Essential hypertension, benign 01/18/2014   Hyperlipidemia 01/18/2014   History of CVA (cerebrovascular accident) 10/22/2013   Malignant hypertension 10/22/2013   PAST MEDICAL HISTORY:  Active Ambulatory Problems    Diagnosis Date Noted   History of CVA (cerebrovascular accident) 10/22/2013   Malignant hypertension 10/22/2013   Type 2 diabetes mellitus with diabetic neuropathy, unspecified ([place]) 01/18/2014   Essential hypertension, benign 01/18/2014   Hyperlipidemia 01/18/2014   Psoriasis 02/14/2016   Anemia 05/24/2020   Tobacco abuse 05/24/2020   Emphysema of lung ([place]) 05/24/2020   Abnormal finding on GI tract imaging    H. pylori infection    Diabetic gastroparesis ([place])    Protein-calorie malnutrition, severe 07/28/2020   CHF (congestive heart failure) ([place]) 11/07/2020   Hypervolemia associated with renal insufficiency 12/13/2020   CAD (coronary artery disease) 12/13/2020   Chronic respiratory failure ([place]) 04/04/2021   ESRD on hemodialysis ([place]) 04/05/2021   Generalized weakness 63/78/5885   Metabolic acidosis, increased anion gap ([facility]) 08/03/2021   Acute right-sided weakness 08/03/2021   Anxiety and depression 02/77/4128   Chronic systolic CHF (congestive heart failure) ([place]) 08/03/2021   Acute on chronic respiratory failure with hypoxia ([place]) 09/12/2021   HCAP (healthcare-associated pneumonia) 09/12/2021   COPD with acute exacerbation ([place]) 09/12/2021   Goals of care, counseling/discussion 09/12/2021   Resolved Ambulatory Problems    Diagnosis Date Noted   GERD (gastroesophageal reflux  disease) 01/18/2014   Diverticulitis 05/24/2020   History of stroke 05/24/2020   Nausea and vomiting 05/24/2020   AKI (acute kidney injury) ([place]) 05/24/2020   Dehydration 05/24/2020   Hypomagnesemia 05/24/2020   Hypokalemia 05/24/2020   ARF (acute renal failure) ([place]) 07/23/2020   Shortness of breath    Acute respiratory failure with hypoxia ([place]) 12/13/2020   Acute pulmonary edema (HCC)    Fluctuating blood pressure 03/08/2021   Chronic kidney disease 03/08/2021   Acute respiratory disease due to COVID-19 virus 04/05/2021   Encephalopathy acute 08/03/2021   Past Medical History:  Diagnosis Date   COPD (chronic obstructive pulmonary disease) ([place]) 05/2019   Diabetes mellitus without complication ([place])    History of blood transfusion  09/2020   HLD (hyperlipidemia)    Hypertension    PONV (postoperative nausea and vomiting)    Restless legs syndrome (RLS)    Stroke (HCC)    SOCIAL HX:  Social History   Tobacco Use   Smoking status: Every Day    Packs/day: 1.00    Years: 47.00    Pack years: 47.00    Types: Cigarettes   Smokeless tobacco: Never   Tobacco comments:    5 cigs daily  Substance Use Topics   Alcohol use: No   FAMILY HX:  Family History  Problem Relation Age of Onset   Stroke Mother    Hypertension Mother    Hyperlipidemia Mother    Hypertension Father    Hyperlipidemia Father       ALLERGIES:  Allergies  Allergen Reactions   Codeine Nausea And Vomiting   Adhesive [Tape] Other (See Comments)    Tape breaks out the skin if it is left on for a lengthy period of time     PERTINENT MEDICATIONS:  Outpatient Encounter Medications as of 10/09/2021  Medication Sig   acetaminophen (TYLENOL) 500 MG tablet Take 1,000 mg by mouth every 6 (six) hours as needed for mild pain or headache.   albuterol (PROVENTIL) (2.5 MG/3ML) 0.083% nebulizer solution Take 3 mLs (2.5 mg total) by nebulization every 6 (six) hours as needed for wheezing or shortness of breath.    albuterol (VENTOLIN HFA) 108 (90 Base) MCG/ACT inhaler Inhale 2 puffs into the lungs every 6 (six) hours as needed for wheezing or shortness of breath.   atorvastatin (LIPITOR) 40 MG tablet Take 1 tablet (40 mg total) by mouth daily at 6 PM.   B Complex-C-Zn-Folic Acid (DIALYVITE 841 WITH ZINC) 0.8 MG TABS Take 1 tablet by mouth at bedtime.   Blood Glucose Monitoring Suppl (BLOOD GLUCOSE METER) kit Use as instructed   Blood Glucose Monitoring Suppl (TRUE METRIX METER) DEVI 1 each by Does not apply route 3 (three) times daily before meals.   calcium acetate (PHOSLO) 667 MG capsule Take 2 capsules (1,334 mg total) by mouth with breakfast, with lunch, and with evening meal.   carvedilol (COREG) 12.5 MG tablet Take 1 tablet (12.5 mg total) by mouth 2 (two) times daily with a meal. (Patient taking differently: Take 12.5 mg by mouth 2 (two) times daily.)   FLUoxetine (PROZAC) 20 MG capsule Take 1 capsule (20 mg total) by mouth daily. (Patient taking differently: Take 20 mg by mouth at bedtime.)   gabapentin (NEURONTIN) 100 MG capsule Take 1 capsule (100 mg total) by mouth at bedtime.   gabapentin (NEURONTIN) 100 MG capsule Take 1 capsule (100 mg total) by mouth 2 (two) times daily.   glucose blood (TRUETEST TEST) test strip Use as instructed   hydrALAZINE (APRESOLINE) 25 MG tablet Take 1 tablet (25 mg total) by mouth every 8 (eight) hours.   Lancets (FREESTYLE) lancets Use as instructed   lidocaine-prilocaine (EMLA) cream Apply 1 application topically See admin instructions. For use on dialysis days- fistula (left arm)   loperamide (IMODIUM A-D) 2 MG tablet Take 1 tablet (2 mg total) by mouth 4 (four) times daily as needed for diarrhea or loose stools.   losartan (COZAAR) 100 MG tablet Take 1 tablet (100 mg total) by mouth every evening. (Patient taking differently: Take 100 mg by mouth See admin instructions. Take 100 mg by mouth in the evening and hold if HYPOtensive (on dialysis days))   Misc. Devices  MISC Large depends.  Diagnosis - fecal incontinence   Misc. Devices MISC Rolling walker with seat   Misc. Devices MISC Wheelchair with Accessories: elevating leg rests (ELRs), wheel locks, extensions and anti-tippers. Cane or walker will not suffice   Misc. Devices MISC Portable oxygen concentrator. Chronic respiratory failure - 4L oxygen qhs and prn   ondansetron (ZOFRAN) 4 MG tablet Take 1 tablet (4 mg total) by mouth every 8 (eight) hours as needed for nausea or vomiting.   OXYGEN Inhale 4 L/min into the lungs continuous.   Tiotropium Bromide Monohydrate (SPIRIVA RESPIMAT) 2.5 MCG/ACT AERS Inhale 2 puffs into the lungs daily.   TRUEplus Lancets 28G MISC 1 each by Does not apply route 3 (three) times daily before meals.   [DISCONTINUED] amLODipine (NORVASC) 10 MG tablet Take 1 tablet (10 mg total) by mouth daily.   [DISCONTINUED] omeprazole (PRILOSEC) 20 MG capsule TAKE 1 CAPSULE (20 MG TOTAL) BY MOUTH 2 (TWO) TIMES DAILY BEFORE A MEAL.   No facility-administered encounter medications on file as of 10/09/2021.   Thank you for the opportunity to participate in the care of Tina Gomez.  The palliative care team will continue to follow. Please call our office at [phone] if we can be of additional assistance.   Holmes Hays, DO ,   COVID-19 PATIENT SCREENING TOOL Asked and negative response unless otherwise noted:  Have you had symptoms of covid, tested positive or been in contact with someone with symptoms/positive test in the past 5-10 days? no

## 2021-10-12 ENCOUNTER — Other Ambulatory Visit: Payer: Self-pay

## 2021-10-12 ENCOUNTER — Observation Stay (HOSPITAL_COMMUNITY)
Admission: EM | Admit: 2021-10-12 | Discharge: 2021-10-14 | Disposition: A | Discharge status: Home / Self Care | Payer: Medicare Other | Attending: Internal Medicine | Admitting: Internal Medicine

## 2021-10-12 ENCOUNTER — Emergency Department (HOSPITAL_COMMUNITY): Payer: Medicare Other

## 2021-10-12 ENCOUNTER — Encounter (HOSPITAL_COMMUNITY): Payer: Self-pay

## 2021-10-12 ENCOUNTER — Observation Stay (HOSPITAL_COMMUNITY): Payer: Medicare Other

## 2021-10-12 DIAGNOSIS — F419 Anxiety disorder, unspecified: Secondary | ICD-10-CM | POA: Diagnosis present

## 2021-10-12 DIAGNOSIS — E1140 Type 2 diabetes mellitus with diabetic neuropathy, unspecified: Secondary | ICD-10-CM | POA: Diagnosis present

## 2021-10-12 DIAGNOSIS — Z992 Dependence on renal dialysis: Secondary | ICD-10-CM | POA: Insufficient documentation

## 2021-10-12 DIAGNOSIS — J449 Chronic obstructive pulmonary disease, unspecified: Secondary | ICD-10-CM

## 2021-10-12 DIAGNOSIS — I2510 Atherosclerotic heart disease of native coronary artery without angina pectoris: Secondary | ICD-10-CM | POA: Diagnosis present

## 2021-10-12 DIAGNOSIS — N186 End stage renal disease: Secondary | ICD-10-CM | POA: Diagnosis not present

## 2021-10-12 DIAGNOSIS — E1122 Type 2 diabetes mellitus with diabetic chronic kidney disease: Secondary | ICD-10-CM | POA: Diagnosis not present

## 2021-10-12 DIAGNOSIS — E1142 Type 2 diabetes mellitus with diabetic polyneuropathy: Secondary | ICD-10-CM | POA: Diagnosis not present

## 2021-10-12 DIAGNOSIS — N189 Chronic kidney disease, unspecified: Secondary | ICD-10-CM

## 2021-10-12 DIAGNOSIS — D631 Anemia in chronic kidney disease: Secondary | ICD-10-CM

## 2021-10-12 DIAGNOSIS — Z79899 Other long term (current) drug therapy: Secondary | ICD-10-CM | POA: Diagnosis not present

## 2021-10-12 DIAGNOSIS — F32A Depression, unspecified: Secondary | ICD-10-CM | POA: Diagnosis present

## 2021-10-12 DIAGNOSIS — I6782 Cerebral ischemia: Secondary | ICD-10-CM | POA: Insufficient documentation

## 2021-10-12 DIAGNOSIS — W19XXXA Unspecified fall, initial encounter: Secondary | ICD-10-CM

## 2021-10-12 DIAGNOSIS — I132 Hypertensive heart and chronic kidney disease with heart failure and with stage 5 chronic kidney disease, or end stage renal disease: Secondary | ICD-10-CM | POA: Diagnosis not present

## 2021-10-12 DIAGNOSIS — E785 Hyperlipidemia, unspecified: Secondary | ICD-10-CM | POA: Diagnosis not present

## 2021-10-12 DIAGNOSIS — I509 Heart failure, unspecified: Secondary | ICD-10-CM

## 2021-10-12 DIAGNOSIS — J9620 Acute and chronic respiratory failure, unspecified whether with hypoxia or hypercapnia: Secondary | ICD-10-CM | POA: Diagnosis present

## 2021-10-12 DIAGNOSIS — Z8673 Personal history of transient ischemic attack (TIA), and cerebral infarction without residual deficits: Secondary | ICD-10-CM

## 2021-10-12 DIAGNOSIS — J9621 Acute and chronic respiratory failure with hypoxia: Secondary | ICD-10-CM | POA: Diagnosis not present

## 2021-10-12 DIAGNOSIS — R778 Other specified abnormalities of plasma proteins: Secondary | ICD-10-CM | POA: Insufficient documentation

## 2021-10-12 DIAGNOSIS — R7989 Other specified abnormal findings of blood chemistry: Secondary | ICD-10-CM

## 2021-10-12 DIAGNOSIS — Z20822 Contact with and (suspected) exposure to covid-19: Secondary | ICD-10-CM | POA: Diagnosis not present

## 2021-10-12 DIAGNOSIS — I10 Essential (primary) hypertension: Secondary | ICD-10-CM | POA: Diagnosis present

## 2021-10-12 DIAGNOSIS — R0602 Shortness of breath: Secondary | ICD-10-CM | POA: Diagnosis present

## 2021-10-12 DIAGNOSIS — I5022 Chronic systolic (congestive) heart failure: Secondary | ICD-10-CM | POA: Diagnosis not present

## 2021-10-12 LAB — CBC WITH DIFFERENTIAL/PLATELET
Abs Immature Granulocytes: 0.1 10*3/uL — ABNORMAL HIGH (ref 0.00–0.07)
Basophils Absolute: 0 10*3/uL (ref 0.0–0.1)
Basophils Relative: 0 %
Eosinophils Absolute: 0.2 10*3/uL (ref 0.0–0.5)
Eosinophils Relative: 2 %
HCT: 32.7 % — ABNORMAL LOW (ref 36.0–46.0)
Hemoglobin: 9.5 g/dL — ABNORMAL LOW (ref 12.0–15.0)
Immature Granulocytes: 1 %
Lymphocytes Relative: 14 %
Lymphs Abs: 1.4 10*3/uL (ref 0.7–4.0)
MCH: 26.8 pg (ref 26.0–34.0)
MCHC: 29.1 g/dL — ABNORMAL LOW (ref 30.0–36.0)
MCV: 92.1 fL (ref 80.0–100.0)
Monocytes Absolute: 0.8 10*3/uL (ref 0.1–1.0)
Monocytes Relative: 8 %
Neutro Abs: 7.2 10*3/uL (ref 1.7–7.7)
Neutrophils Relative %: 75 %
Platelets: 228 10*3/uL (ref 150–400)
RBC: 3.55 MIL/uL — ABNORMAL LOW (ref 3.87–5.11)
RDW: 18.9 % — ABNORMAL HIGH (ref 11.5–15.5)
WBC: 9.6 10*3/uL (ref 4.0–10.5)
nRBC: 0 % (ref 0.0–0.2)

## 2021-10-12 LAB — BASIC METABOLIC PANEL
Anion gap: 20 — ABNORMAL HIGH (ref 5–15)
BUN: 92 mg/dL — ABNORMAL HIGH (ref 8–23)
CO2: 22 mmol/L (ref 22–32)
Calcium: 8.2 mg/dL — ABNORMAL LOW (ref 8.9–10.3)
Chloride: 95 mmol/L — ABNORMAL LOW (ref 98–111)
Creatinine, Ser: 9.53 mg/dL — ABNORMAL HIGH (ref 0.44–1.00)
GFR, Estimated: 4 mL/min — ABNORMAL LOW (ref >60)
Glucose, Bld: 170 mg/dL — ABNORMAL HIGH (ref 70–99)
Potassium: 3.9 mmol/L (ref 3.5–5.1)
Sodium: 137 mmol/L (ref 135–145)

## 2021-10-12 LAB — TROPONIN I (HIGH SENSITIVITY)
Troponin I (High Sensitivity): 62 ng/L — ABNORMAL HIGH (ref <18)
Troponin I (High Sensitivity): 61 ng/L — ABNORMAL HIGH (ref <18)

## 2021-10-12 LAB — BRAIN NATRIURETIC PEPTIDE: B Natriuretic Peptide: 3975.9 pg/mL — ABNORMAL HIGH (ref 0.0–100.0)

## 2021-10-12 LAB — HEPATITIS B SURFACE ANTIGEN: Hepatitis B Surface Ag: NONREACTIVE

## 2021-10-12 LAB — GLUCOSE, CAPILLARY
Glucose-Capillary: 162 mg/dL — ABNORMAL HIGH (ref 70–99)
Glucose-Capillary: 210 mg/dL — ABNORMAL HIGH (ref 70–99)

## 2021-10-12 LAB — RESP PANEL BY RT-PCR (FLU A&B, COVID) ARPGX2
Influenza A by PCR: NEGATIVE
Influenza B by PCR: NEGATIVE
SARS Coronavirus 2 by RT PCR: NEGATIVE

## 2021-10-12 LAB — HEPATITIS B SURFACE ANTIBODY,QUALITATIVE: Hep B S Ab: REACTIVE — AB

## 2021-10-12 MED ORDER — HEPARIN SODIUM (PORCINE) 5000 UNIT/ML IJ SOLN
5000.0000 [IU] | Freq: Three times a day (TID) | INTRAMUSCULAR | Status: DC
  Administered 2021-10-12 – 2021-10-14 (×4): 5000 [IU] via SUBCUTANEOUS
  Filled 2021-10-12 (×4): qty 1

## 2021-10-12 MED ORDER — ATORVASTATIN CALCIUM 40 MG PO TABS
40.0000 mg | ORAL_TABLET | Freq: Every day | ORAL | Status: DC
  Administered 2021-10-12 – 2021-10-13 (×2): 40 mg via ORAL
  Filled 2021-10-12 (×2): qty 1

## 2021-10-12 MED ORDER — SODIUM CHLORIDE 0.9% FLUSH
3.0000 mL | Freq: Two times a day (BID) | INTRAVENOUS | Status: DC
  Administered 2021-10-12 – 2021-10-13 (×4): 3 mL via INTRAVENOUS

## 2021-10-12 MED ORDER — TIOTROPIUM BROMIDE MONOHYDRATE 2.5 MCG/ACT IN AERS: 2.0000 | INHALATION_SPRAY | Freq: Every day | RESPIRATORY_TRACT | Status: DC

## 2021-10-12 MED ORDER — CARVEDILOL 12.5 MG PO TABS
12.5000 mg | ORAL_TABLET | Freq: Every day | ORAL | Status: DC
Start: 2021-10-12 — End: 2021-10-14
  Administered 2021-10-12 – 2021-10-13 (×2): 12.5 mg via ORAL
  Filled 2021-10-12 (×2): qty 1

## 2021-10-12 MED ORDER — ALBUTEROL SULFATE (2.5 MG/3ML) 0.083% IN NEBU
2.5000 mg | INHALATION_SOLUTION | Freq: Every day | RESPIRATORY_TRACT | Status: DC
  Administered 2021-10-12 – 2021-10-13 (×2): 2.5 mg via RESPIRATORY_TRACT
  Filled 2021-10-12 (×3): qty 3

## 2021-10-12 MED ORDER — PENTAFLUOROPROP-TETRAFLUOROETH EX AERO
1.0000 "application " | INHALATION_SPRAY | CUTANEOUS | Status: DC | PRN
  Filled 2021-10-12: qty 116
  Filled 2021-10-12: qty 103.5

## 2021-10-12 MED ORDER — LOSARTAN POTASSIUM 50 MG PO TABS
100.0000 mg | ORAL_TABLET | ORAL | Status: DC
  Administered 2021-10-14: 100 mg via ORAL
  Filled 2021-10-12: qty 2

## 2021-10-12 MED ORDER — ONDANSETRON HCL 4 MG PO TABS: 4.0000 mg | ORAL_TABLET | Freq: Four times a day (QID) | ORAL | Status: DC | PRN

## 2021-10-12 MED ORDER — CALCIUM ACETATE (PHOS BINDER) 667 MG PO CAPS
1334.0000 mg | ORAL_CAPSULE | Freq: Two times a day (BID) | ORAL | Status: DC | PRN
  Filled 2021-10-12: qty 2

## 2021-10-12 MED ORDER — SODIUM CHLORIDE 0.9 % IV SOLN: 250.0000 mL | INTRAVENOUS | Status: DC | PRN

## 2021-10-12 MED ORDER — CHLORHEXIDINE GLUCONATE CLOTH 2 % EX PADS: 6.0000 | MEDICATED_PAD | Freq: Every day | CUTANEOUS | Status: DC

## 2021-10-12 MED ORDER — LIDOCAINE HCL (PF) 1 % IJ SOLN: 5.0000 mL | INTRAMUSCULAR | Status: DC | PRN

## 2021-10-12 MED ORDER — DIALYVITE 800/ZINC 0.8 MG PO TABS: 1.0000 | ORAL_TABLET | Freq: Every day | ORAL | Status: DC

## 2021-10-12 MED ORDER — GABAPENTIN 100 MG PO CAPS
200.0000 mg | ORAL_CAPSULE | Freq: Every day | ORAL | Status: DC
  Administered 2021-10-12 – 2021-10-13 (×2): 200 mg via ORAL
  Filled 2021-10-12 (×2): qty 2

## 2021-10-12 MED ORDER — LIDOCAINE-PRILOCAINE 2.5-2.5 % EX CREA
1.0000 "application " | TOPICAL_CREAM | CUTANEOUS | Status: DC | PRN
  Filled 2021-10-12: qty 5

## 2021-10-12 MED ORDER — CARVEDILOL 12.5 MG PO TABS
12.5000 mg | ORAL_TABLET | ORAL | Status: DC
  Administered 2021-10-14: 12.5 mg via ORAL
  Filled 2021-10-12: qty 1

## 2021-10-12 MED ORDER — RENA-VITE PO TABS
1.0000 | ORAL_TABLET | Freq: Every day | ORAL | Status: DC
  Administered 2021-10-12 – 2021-10-13 (×2): 1 via ORAL
  Filled 2021-10-12 (×2): qty 1

## 2021-10-12 MED ORDER — SODIUM CHLORIDE 0.9 % IV SOLN: 100.0000 mL | INTRAVENOUS | Status: DC | PRN

## 2021-10-12 MED ORDER — ACETAMINOPHEN 650 MG RE SUPP: 650.0000 mg | Freq: Four times a day (QID) | RECTAL | Status: DC | PRN

## 2021-10-12 MED ORDER — ALTEPLASE 2 MG IJ SOLR
2.0000 mg | Freq: Once | INTRAMUSCULAR | Status: DC | PRN
  Filled 2021-10-12: qty 2

## 2021-10-12 MED ORDER — SODIUM CHLORIDE 0.9% FLUSH: 3.0000 mL | INTRAVENOUS | Status: DC | PRN

## 2021-10-12 MED ORDER — FLUOXETINE HCL 20 MG PO CAPS
20.0000 mg | ORAL_CAPSULE | Freq: Every day | ORAL | Status: DC
Start: 2021-10-12 — End: 2021-10-14
  Administered 2021-10-12 – 2021-10-13 (×2): 20 mg via ORAL
  Filled 2021-10-12 (×2): qty 1

## 2021-10-12 MED ORDER — LOSARTAN POTASSIUM 50 MG PO TABS
100.0000 mg | ORAL_TABLET | ORAL | Status: DC
  Administered 2021-10-12 – 2021-10-13 (×2): 100 mg via ORAL
  Filled 2021-10-12 (×2): qty 2

## 2021-10-12 MED ORDER — CALCIUM ACETATE (PHOS BINDER) 667 MG PO CAPS
2001.0000 mg | ORAL_CAPSULE | Freq: Three times a day (TID) | ORAL | Status: DC
  Administered 2021-10-12 – 2021-10-14 (×6): 2001 mg via ORAL
  Filled 2021-10-12 (×6): qty 3

## 2021-10-12 MED ORDER — UMECLIDINIUM BROMIDE 62.5 MCG/ACT IN AEPB
1.0000 | INHALATION_SPRAY | Freq: Every day | RESPIRATORY_TRACT | Status: DC
  Filled 2021-10-12: qty 7

## 2021-10-12 MED ORDER — LOPERAMIDE HCL 2 MG PO CAPS
2.0000 mg | ORAL_CAPSULE | ORAL | Status: DC
  Administered 2021-10-13: 2 mg via ORAL
  Filled 2021-10-12: qty 1

## 2021-10-12 MED ORDER — HEPARIN SODIUM (PORCINE) 1000 UNIT/ML DIALYSIS
1000.0000 [IU] | INTRAMUSCULAR | Status: DC | PRN
  Filled 2021-10-12: qty 1

## 2021-10-12 MED ORDER — ALBUTEROL SULFATE (2.5 MG/3ML) 0.083% IN NEBU: 2.5000 mg | INHALATION_SOLUTION | Freq: Four times a day (QID) | RESPIRATORY_TRACT | Status: DC | PRN

## 2021-10-12 MED ORDER — ACETAMINOPHEN 325 MG PO TABS: 650.0000 mg | ORAL_TABLET | Freq: Four times a day (QID) | ORAL | Status: DC | PRN

## 2021-10-12 MED ORDER — ALBUTEROL SULFATE HFA 108 (90 BASE) MCG/ACT IN AERS: 2.0000 | INHALATION_SPRAY | Freq: Four times a day (QID) | RESPIRATORY_TRACT | Status: DC | PRN

## 2021-10-12 MED ORDER — ONDANSETRON HCL 4 MG/2ML IJ SOLN
4.0000 mg | Freq: Four times a day (QID) | INTRAMUSCULAR | Status: DC | PRN
  Administered 2021-10-13: 4 mg via INTRAVENOUS
  Filled 2021-10-12: qty 2

## 2021-10-12 MED ORDER — INSULIN ASPART 100 UNIT/ML IJ SOLN
0.0000 [IU] | Freq: Three times a day (TID) | INTRAMUSCULAR | Status: DC
  Administered 2021-10-12: 1 [IU] via SUBCUTANEOUS
  Administered 2021-10-14: 2 [IU] via SUBCUTANEOUS

## 2021-10-12 NOTE — Assessment & Plan Note (Addendum)
-  Secondary to demand ischemia in setting of pulmonary edema, no symptoms of ACS, flat trend Cardiac cath 11/2020: 70% proximal right coronary artery  followed by 50% with otherwise non obstructive disease.  -Continue Coreg, statin

## 2021-10-12 NOTE — ED Notes (Signed)
Pt to dialysis at that this time.

## 2021-10-12 NOTE — Assessment & Plan Note (Deleted)
Likely secondary from demand ischemia in setting of acute on chronic respiratory failure Flat No chest pain and no changes on EKG

## 2021-10-12 NOTE — Assessment & Plan Note (Deleted)
Continue lipitor  ?

## 2021-10-12 NOTE — Assessment & Plan Note (Addendum)
Pulmonary edema from missed HD  Acute on chronic systolic CHF  -Improving, undergoes dialysis 4X per week  -Nephrology consulting, plan for HD today  -Wean O2 down to 3-4 L  -Ambulate, PT eval

## 2021-10-12 NOTE — Assessment & Plan Note (Signed)
Continue home medication of coreg, cozaar

## 2021-10-12 NOTE — ED Triage Notes (Signed)
Pt bib [facility] from home with complaints of increased shob that started yesterday. Pt did have n/v on Wednesday this week so she missed her dialysis treatment. Pt arrives wearing NRB 15L, due to oxygen saturation 86% on normal 3L [place] at home. Pt does endorse productive cough since she had covid in October. AOx4.  EMS vitals: 164/62, [id number]

## 2021-10-12 NOTE — Assessment & Plan Note (Addendum)
PT eval.

## 2021-10-12 NOTE — ED Provider Notes (Signed)
[facility] EMERGENCY DEPARTMENT Provider Note   CSN: [id number] Arrival date & time: 10/12/21  0155     History  No chief complaint on file.   Tina Gomez is a 66 y.o. female.  The history is provided by the patient.  She has history of hypertension, diabetes, hyperlipidemia, COPD, end-stage renal disease on hemodialysis, chronic systolic heart failure who comes in because of shortness of breath all day.  Shortness of breath is worse when laying supine.  She denies chest pain, heaviness, tightness, pressure.  She is dialyzed every Monday-Wednesday-Friday-Saturday because she is unable to tolerate full 4-hour dialysis sessions because of her heart disease.  She missed her dialysis yesterday because of nausea and vomiting.  She does not make urine.  She has had problems with needing urgent dialysis when she misses dialysis sessions in the past.   Home Medications Prior to Admission medications   Medication Sig Start Date End Date Taking? Authorizing Provider  acetaminophen (TYLENOL) 500 MG tablet Take 1,000 mg by mouth every 6 (six) hours as needed for mild pain or headache.    [provider]  albuterol (PROVENTIL) (2.5 MG/3ML) 0.083% nebulizer solution Take 3 mLs (2.5 mg total) by nebulization every 6 (six) hours as needed for wheezing or shortness of breath. 07/12/21   Charlott Rakes, MD  albuterol (VENTOLIN HFA) 108 (90 Base) MCG/ACT inhaler Inhale 2 puffs into the lungs every 6 (six) hours as needed for wheezing or shortness of breath. 07/11/21   Charlott Rakes, MD  atorvastatin (LIPITOR) 40 MG tablet Take 1 tablet (40 mg total) by mouth daily at 6 PM. 04/15/21   Jennye Boroughs, MD  B Complex-C-Zn-Folic Acid (DIALYVITE 696 WITH ZINC) 0.8 MG TABS Take 1 tablet by mouth at bedtime. 08/12/20   [provider]  Blood Glucose Monitoring Suppl (BLOOD GLUCOSE METER) kit Use as instructed 11/01/13   Reyne Dumas, MD  Blood Glucose Monitoring Suppl (TRUE  METRIX METER) DEVI 1 each by Does not apply route 3 (three) times daily before meals. 06/02/20   Charlott Rakes, MD  calcium acetate (PHOSLO) 667 MG capsule Take 2 capsules (1,334 mg total) by mouth with breakfast, with lunch, and with evening meal. 09/13/21 10/13/21  Kristopher Oppenheim, DO  carvedilol (COREG) 12.5 MG tablet Take 1 tablet (12.5 mg total) by mouth 2 (two) times daily with a meal. Patient taking differently: Take 12.5 mg by mouth 2 (two) times daily. 04/15/21 04/15/22  Jennye Boroughs, MD  FLUoxetine (PROZAC) 20 MG capsule Take 1 capsule (20 mg total) by mouth daily. Patient taking differently: Take 20 mg by mouth at bedtime. 07/11/21   Charlott Rakes, MD  gabapentin (NEURONTIN) 100 MG capsule Take 1 capsule (100 mg total) by mouth at bedtime. 04/15/21   Jennye Boroughs, MD  gabapentin (NEURONTIN) 100 MG capsule Take 1 capsule (100 mg total) by mouth 2 (two) times daily. 10/01/21   Charlott Rakes, MD  glucose blood (TRUETEST TEST) test strip Use as instructed 06/02/20   Charlott Rakes, MD  hydrALAZINE (APRESOLINE) 25 MG tablet Take 1 tablet (25 mg total) by mouth every 8 (eight) hours. 09/13/21 10/13/21  Kristopher Oppenheim, DO  Lancets (FREESTYLE) lancets Use as instructed 11/01/13   Reyne Dumas, MD  lidocaine-prilocaine (EMLA) cream Apply 1 application topically See admin instructions. For use on dialysis days- fistula (left arm) 12/04/20   [provider]  loperamide (IMODIUM A-D) 2 MG tablet Take 1 tablet (2 mg total) by mouth 4 (four) times daily as  needed for diarrhea or loose stools. 09/26/21   Charlott Rakes, MD  losartan (COZAAR) 100 MG tablet Take 1 tablet (100 mg total) by mouth every evening. Patient taking differently: Take 100 mg by mouth See admin instructions. Take 100 mg by mouth in the evening and hold if HYPOtensive (on dialysis days) 05/01/21   Charlott Rakes, MD  Misc. Devices MISC Large depends. Diagnosis - fecal incontinence 01/30/21   Charlott Rakes, MD  Misc. Devices MISC Rolling  walker with seat 08/14/21   Charlott Rakes, MD  Misc. Devices MISC Wheelchair with Accessories: elevating leg rests (ELRs), wheel locks, extensions and anti-tippers. Cane or walker will not suffice 08/14/21   Charlott Rakes, MD  Misc. Devices MISC Portable oxygen concentrator. Chronic respiratory failure - 4L oxygen qhs and prn 08/14/21   Charlott Rakes, MD  ondansetron (ZOFRAN) 4 MG tablet Take 1 tablet (4 mg total) by mouth every 8 (eight) hours as needed for nausea or vomiting. 06/19/21   Charlott Rakes, MD  OXYGEN Inhale 4 L/min into the lungs continuous.    [provider]  Tiotropium Bromide Monohydrate (SPIRIVA RESPIMAT) 2.5 MCG/ACT AERS Inhale 2 puffs into the lungs daily. 01/30/21   Charlott Rakes, MD  TRUEplus Lancets 28G MISC 1 each by Does not apply route 3 (three) times daily before meals. 06/02/20   Charlott Rakes, MD  amLODipine (NORVASC) 10 MG tablet Take 1 tablet (10 mg total) by mouth daily. 08/01/20 11/09/20  Terrilee Croak, MD  omeprazole (PRILOSEC) 20 MG capsule TAKE 1 CAPSULE (20 MG TOTAL) BY MOUTH 2 (TWO) TIMES DAILY BEFORE A MEAL. 08/01/20 08/22/20  Charlott Rakes, MD      Allergies    Codeine and Adhesive [tape]    Review of Systems   Review of Systems  All other systems reviewed and are negative.  Physical Exam Updated Vital Signs Pulse 69    Temp 98.4 F (36.9 C) (Oral)    Resp 19    Ht _0  (1.575 m)    Wt 77.1 kg    LMP  (LMP Unknown)    SpO2 100%    BMI 31.09 kg/m  Physical Exam Vitals and nursing note reviewed.  66 year old female, resting comfortably and in no acute distress. Vital signs are normal. Oxygen saturation is 100%, which is normal, but only able to be maintained well on monitor percent oxygen via nonrebreather mask. Head is normocephalic and atraumatic. PERRLA, EOMI. Oropharynx is clear. Neck is nontender and supple without adenopathy or JVD. Back is nontender and there is no CVA tenderness. Lungs are clear without rales, wheezes, or  rhonchi. Chest is nontender. Heart has regular rate and rhythm without murmur. Abdomen is soft, flat, nontender without masses or hepatosplenomegaly and peristalsis is normoactive. Extremities have no cyanosis or edema, full range of motion is present.  AV fistula present left arm with thrill present. Skin is warm and dry without rash. Neurologic: Mental status is normal, cranial nerves are intact, moves all extremities equally.  ED Results / Procedures / Treatments   Labs (all labs ordered are listed, but only abnormal results are displayed) Labs Reviewed  BASIC METABOLIC PANEL - Abnormal; Notable for the following components:      Result Value   Chloride 95 (*)    Glucose, Bld 170 (*)    BUN 92 (*)    Creatinine, Ser 9.53 (*)    Calcium 8.2 (*)    GFR, Estimated 4 (*)    Anion gap 20 (*)  All other components within normal limits  CBC WITH DIFFERENTIAL/PLATELET - Abnormal; Notable for the following components:   RBC 3.55 (*)    Hemoglobin 9.5 (*)    HCT 32.7 (*)    MCHC 29.1 (*)    RDW 18.9 (*)    Abs Immature Granulocytes 0.10 (*)    All other components within normal limits  TROPONIN I (HIGH SENSITIVITY) - Abnormal; Notable for the following components:   Troponin I (High Sensitivity) 62 (*)    All other components within normal limits  TROPONIN I (HIGH SENSITIVITY) - Abnormal; Notable for the following components:   Troponin I (High Sensitivity) 61 (*)    All other components within normal limits  RESP PANEL BY RT-PCR (FLU A&B, COVID) ARPGX2  HEPATITIS B SURFACE ANTIGEN  HEPATITIS B SURFACE ANTIBODY,QUALITATIVE  HEPATITIS B SURFACE ANTIBODY, QUANTITATIVE    EKG EKG Interpretation  Date/Time:  Friday October 12 2021 02:00:20 EST Ventricular Rate:  69 PR Interval:  283 QRS Duration: 99 QT Interval:  451 QTC Calculation: 484 R Axis:   74 Text Interpretation: Sinus rhythm Prolonged PR interval When compared with ECG of 09/12/2021, No significant change was found  Confirmed by Delora Fuel ([id number]) on 10/12/2021 2:12:02 AM  Radiology DG Chest Port 1 View  Result Date: 10/12/2021 CLINICAL DATA:  Shortness of breath EXAM: PORTABLE CHEST 1 VIEW COMPARISON:  09/12/2021 FINDINGS: Cardiac shadow is enlarged. Aortic calcifications are noted. Bilateral airspace opacities have improved somewhat in the interval from the prior exam. Interstitial changes are seen consistent with edema. No sizable effusion is noted. IMPRESSION: Interstitial edema bilaterally. Previously seen opacities have improved somewhat in the interval from the prior study. Electronically Signed   By: Inez Catalina M.D.   On: 10/12/2021 02:34    Procedures Procedures  Cardiac monitor shows normal sinus rhythm per my interpretation.  Medications Ordered in ED Medications - No data to display  ED Course/ Medical Decision Making/ A&P                           Medical Decision Making Amount and/or Complexity of Data Reviewed Labs: ordered. Radiology: ordered.   Shortness of breath and dialysis patient who missed her dialysis yesterday.  In spite of lack of edema, this is almost certainly fluid overload.  Doubt pneumonia.  Old records are reviewed, and echocardiogram 1 year ago showed ejection fraction of 30% with grade 2 diastolic dysfunction.  She was admitted 1 month ago with a similar presentation requiring urgent dialysis.  ECG shows no acute changes - specifically, no peaked T waves or QRS widening to suggest hyperkalemia.  ECG shows no acute changes.  Chest x-ray shows cardiomegaly with some interstitial edema consistent with fluid overload.  I have reviewed the image, and agree with the radiologist interpretation.  Troponin is mildly elevated, felt to represent commendation of demand ischemia and decreased clearance from that by the kidney.  Repeat troponin is unchanged, no evidence of ACS.  Anemia is present and unchanged from prior, presumably secondary to end-stage renal disease.  Respiratory  pathogen panel is negative for influenza and COVID-19.  Patient was kept in the emergency department on supplemental oxygen and has been resting comfortably.  Unfortunately, with her oxygen need, she is not a candidate for being sent to her usual outpatient dialysis.  Case was discussed with Dr. Hollie Salk, on-call for nephrology, who agrees to arrange for dialysis from the emergency department.  Final Clinical Impression(s) / ED Diagnoses Final diagnoses:  Acute on chronic heart failure, unspecified heart failure type (HCC)  Elevated troponin  End-stage renal disease on hemodialysis ([place])  Anemia associated with chronic renal failure    Rx / DC Orders ED Discharge Orders     None         Delora Fuel, MD 55/73/22 [phone]

## 2021-10-12 NOTE — Assessment & Plan Note (Addendum)
-  Nephrology consulting, had dialysis yesterday  -Plan for repeat HD today  -Recently met w/ palliative care team

## 2021-10-12 NOTE — Procedures (Signed)
Patient seen on Hemodialysis. BP (!) 153/64    Pulse (!) 58    Temp 98.4 F (36.9 C)    Resp 14    Ht 5\' 2"  (1.575 m)    Wt 77.1 kg    LMP  (LMP Unknown)    SpO2 99%    BMI 31.09 kg/m   QB 400, UF goal 3L Tolerating treatment without problems and reports that she is breathing better.   Elmarie Shiley MD [facility]. Office # [phone] Pager # [phone] 11:22 AM

## 2021-10-12 NOTE — H&P (Signed)
History and Physical    Patient: Tina Gomez CLE:[id number] DOB: [birth year] DOA: 10/12/2021 DOS: the patient was seen and examined on 10/12/2021 PCP: Charlott Rakes, MD  Patient coming from: Home - lives with her daughter    Chief Complaint: shortness of breath  HPI: Tina Gomez is a 66 y.o. female with medical history significant of   COPD on 3-4 L home O2; DM; ESRD on MWFS HD; HTN; HLD; CVA; CAD; and chronic systolic CHF presenting with shortness of breath and increased oxygen demand after missing her dialysis session on Wednesday. Yesterday Am she got up and was off balance and fell. She hit her head and cut her arm. She had no confusion and didn't want to come to ED.  She called her daughter around 1am this morning and said she couldn't breath. Her oxygen was 70% on 5L and so EMS was called. She had nausea and diarrhea on Wednesday and didn't go to her dialysis session. She has increased swelling in her stomach, none in her legs. She has been eating and drinking okay.   Met with hospice on Tuesday, but she is not ready to stop HD or go on dialysis. She can only tolerate about 3 hours of dialysis at a time.   She has had no fever/chills, no chest pain or palpitations, no wheezing or worsening cough (has lingering cough from covid), no stomach pain, no n/v/d  since Wednesday, no leg swelling.   She had dialysis in while in ED but did not receive full removal of fluid. Breathing better, but still requiring 7L of oxygen.   ER Course:  vitals: afebrile, bp: 169/70, HR: 69, RR: 19, oxygen: 100% on 15L NRM Pertinent labs: hgb: 9.5, BUN: 92, creatinine: 9.53, AG: 20, troponin 62>61,  CXR: interstitial edema bilaterally  Nephrology consulted for dialysis and trh asked to admit.    Review of Systems: As mentioned in the history of present illness. All other systems reviewed and are negative. Past Medical History:  Diagnosis Date   Anemia    Chronic kidney disease    COPD (chronic  obstructive pulmonary disease) ([place]) 05/2019   no inhaler   Diabetes mellitus without complication ([place])    no meds - diet controlled   History of blood transfusion 09/2020   1 unit   HLD (hyperlipidemia)    Hypertension    PONV (postoperative nausea and vomiting)    Restless legs syndrome (RLS)    Stroke [facility])    Past Surgical History:  Procedure Laterality Date   AV FISTULA PLACEMENT Left 07/27/2020   Procedure: LEFT BRACHIO-BASILIC ARTERIOVENOUS (AV) FISTULA CREATION;  Surgeon: Cherre Robins, MD;  Location: [place];  Service: Vascular;  Laterality: Left;   [place] Left 11/02/2020   Procedure: LEFT UPPER ARM SECOND STAGE BASILIC VEIN TRANSPOSITION;  Surgeon: Cherre Robins, MD;  Location: [place];  Service: Vascular;  Laterality: Left;  PERIPHERAL NERVE BLOCK   CESAREAN SECTION     x 1   ESOPHAGOGASTRODUODENOSCOPY (EGD) WITH PROPOFOL N/A 05/26/2020   Procedure: ESOPHAGOGASTRODUODENOSCOPY (EGD) WITH PROPOFOL;  Surgeon: Irene Shipper, MD;  Location: [place];  Service: Endoscopy;  Laterality: N/A;   INSERTION OF DIALYSIS CATHETER Right 07/27/2020   Procedure: INSERTION OF RIGHT INTERNAL JUGULAR TUNNELED DIALYSIS CATHETER;  Surgeon: Cherre Robins, MD;  Location: [place];  Service: Vascular;  Laterality: Right;   IR FLUORO GUIDE CV LINE RIGHT  07/25/2020   IR US GUIDE VASC ACCESS RIGHT  07/25/2020  LEFT HEART CATH AND CORONARY ANGIOGRAPHY N/A 11/28/2020   Procedure: LEFT HEART CATH AND CORONARY ANGIOGRAPHY;  Surgeon: Troy Sine, MD;  Location: [place] [facility];  Service: Cardiovascular;  Laterality: N/A;   TONSILLECTOMY     UPPER GI ENDOSCOPY     growth removed from voice box   WRIST SURGERY Left    ganglion cyst removal   Social History:  reports that she has been smoking cigarettes. She has a 47.00 pack-year smoking history. She has never used smokeless tobacco. She reports that she does not drink alcohol and does not use drugs.  Allergies  Allergen  Reactions   Codeine Nausea And Vomiting   Adhesive [Tape] Other (See Comments)    Tape breaks out the skin if it is left on for a lengthy period of time    Family History  Problem Relation Age of Onset   Stroke Mother    Hypertension Mother    Hyperlipidemia Mother    Hypertension Father    Hyperlipidemia Father     Prior to Admission medications   Medication Sig Start Date End Date Taking? Authorizing Provider  acetaminophen (TYLENOL) 500 MG tablet Take 1,000 mg by mouth every 6 (six) hours as needed for mild pain or headache.    [provider]  albuterol (PROVENTIL) (2.5 MG/3ML) 0.083% nebulizer solution Take 3 mLs (2.5 mg total) by nebulization every 6 (six) hours as needed for wheezing or shortness of breath. 07/12/21   Charlott Rakes, MD  albuterol (VENTOLIN HFA) 108 (90 Base) MCG/ACT inhaler Inhale 2 puffs into the lungs every 6 (six) hours as needed for wheezing or shortness of breath. 07/11/21   Charlott Rakes, MD  atorvastatin (LIPITOR) 40 MG tablet Take 1 tablet (40 mg total) by mouth daily at 6 PM. 04/15/21   Jennye Boroughs, MD  B Complex-C-Zn-Folic Acid (DIALYVITE 981 WITH ZINC) 0.8 MG TABS Take 1 tablet by mouth at bedtime. 08/12/20   [provider]  Blood Glucose Monitoring Suppl (BLOOD GLUCOSE METER) kit Use as instructed 11/01/13   Reyne Dumas, MD  Blood Glucose Monitoring Suppl (TRUE METRIX METER) DEVI 1 each by Does not apply route 3 (three) times daily before meals. 06/02/20   Charlott Rakes, MD  calcium acetate (PHOSLO) 667 MG capsule Take 2 capsules (1,334 mg total) by mouth with breakfast, with lunch, and with evening meal. 09/13/21 10/13/21  Kristopher Oppenheim, DO  carvedilol (COREG) 12.5 MG tablet Take 1 tablet (12.5 mg total) by mouth 2 (two) times daily with a meal. Patient taking differently: Take 12.5 mg by mouth 2 (two) times daily. 04/15/21 04/15/22  Jennye Boroughs, MD  FLUoxetine (PROZAC) 20 MG capsule Take 1 capsule (20 mg total) by mouth daily. Patient  taking differently: Take 20 mg by mouth at bedtime. 07/11/21   Charlott Rakes, MD  gabapentin (NEURONTIN) 100 MG capsule Take 1 capsule (100 mg total) by mouth at bedtime. 04/15/21   Jennye Boroughs, MD  gabapentin (NEURONTIN) 100 MG capsule Take 1 capsule (100 mg total) by mouth 2 (two) times daily. 10/01/21   Charlott Rakes, MD  glucose blood (TRUETEST TEST) test strip Use as instructed 06/02/20   Charlott Rakes, MD  hydrALAZINE (APRESOLINE) 25 MG tablet Take 1 tablet (25 mg total) by mouth every 8 (eight) hours. 09/13/21 10/13/21  Kristopher Oppenheim, DO  Lancets (FREESTYLE) lancets Use as instructed 11/01/13   Reyne Dumas, MD  lidocaine-prilocaine (EMLA) cream Apply 1 application topically See admin instructions. For use on dialysis days- fistula (  left arm) 12/04/20   [provider]  loperamide (IMODIUM A-D) 2 MG tablet Take 1 tablet (2 mg total) by mouth 4 (four) times daily as needed for diarrhea or loose stools. 09/26/21   Charlott Rakes, MD  losartan (COZAAR) 100 MG tablet Take 1 tablet (100 mg total) by mouth every evening. Patient taking differently: Take 100 mg by mouth See admin instructions. Take 100 mg by mouth in the evening and hold if HYPOtensive (on dialysis days) 05/01/21   Charlott Rakes, MD  Misc. Devices MISC Large depends. Diagnosis - fecal incontinence 01/30/21   Charlott Rakes, MD  Misc. Devices MISC Rolling walker with seat 08/14/21   Charlott Rakes, MD  Misc. Devices MISC Wheelchair with Accessories: elevating leg rests (ELRs), wheel locks, extensions and anti-tippers. Cane or walker will not suffice 08/14/21   Charlott Rakes, MD  Misc. Devices MISC Portable oxygen concentrator. Chronic respiratory failure - 4L oxygen qhs and prn 08/14/21   Charlott Rakes, MD  ondansetron (ZOFRAN) 4 MG tablet Take 1 tablet (4 mg total) by mouth every 8 (eight) hours as needed for nausea or vomiting. 06/19/21   Charlott Rakes, MD  OXYGEN Inhale 4 L/min into the lungs continuous.    [provider]  Tiotropium Bromide Monohydrate (SPIRIVA RESPIMAT) 2.5 MCG/ACT AERS Inhale 2 puffs into the lungs daily. 01/30/21   Charlott Rakes, MD  TRUEplus Lancets 28G MISC 1 each by Does not apply route 3 (three) times daily before meals. 06/02/20   Charlott Rakes, MD  amLODipine (NORVASC) 10 MG tablet Take 1 tablet (10 mg total) by mouth daily. 08/01/20 11/09/20  Terrilee Croak, MD  omeprazole (PRILOSEC) 20 MG capsule TAKE 1 CAPSULE (20 MG TOTAL) BY MOUTH 2 (TWO) TIMES DAILY BEFORE A MEAL. 08/01/20 08/22/20  Charlott Rakes, MD    Physical Exam: Vitals:   10/12/21 1406 10/12/21 1930 10/12/21 2002 10/12/21 2005  BP: (!) 150/45 (!) 171/56    Pulse: 66 62    Resp: 18 12    Temp: 98.8 F (37.1 C) 97.8 F (36.6 C)    TempSrc: Oral     SpO2: 99% 94% 98% 94%  Weight:      Height:       General:  Appears calm and comfortable and is in NAD. Has small goose egg on left lateral scalp  Eyes:  PERRL, EOMI, normal lids, iris ENT:  grossly normal hearing, lips & tongue, mmm; appropriate dentition Neck:  no LAD, masses or thyromegaly; no carotid bruits Cardiovascular:  RRR, no m/r/g. No LE edema.  Respiratory:   CTA bilaterally with no wheezes/rales/rhonchi.  Normal respiratory effort. Abdomen:  soft, NT, ND, NABS Back:   normal alignment, no CVAT Skin:  no rash or induration seen on limited exam. Superficial abrasions on right forearm  Musculoskeletal:  grossly weak tone BUE/BLE, good ROM, no bony abnormality Lower extremity:  No LE edema.  Limited foot exam with no ulcerations.  2+ distal pulses. Psychiatric:  grossly normal mood and affect, speech fluent and appropriate, AOx3 Neurologic:  CN 2-12 grossly intact, moves all extremities in coordinated fashion, sensation intact   Radiological Exams on Admission: Independently reviewed - see discussion in A/P where applicable  CT HEAD WO CONTRAST (5MM)  Result Date: 10/12/2021 CLINICAL DATA:  History: Head trauma, moderate/severe.  Provided  EXAM: CT HEAD WITHOUT CONTRAST TECHNIQUE: Contiguous axial images were obtained from the base of the skull through the vertex without intravenous contrast. RADIATION DOSE REDUCTION: This exam was performed according to  the departmental dose-optimization program which includes automated exposure control, adjustment of the mA and/or kV according to patient size and/or use of iterative reconstruction technique. COMPARISON:  Brain MRI 08/03/2021.  Head CT 08/03/2021. FINDINGS: Brain: Mild generalized cerebral atrophy. Mild patchy and ill-defined hypoattenuation within the cerebral white matter, nonspecific but compatible with chronic small vessel ischemic disease. There is no acute intracranial hemorrhage. No demarcated cortical infarct. No extra-axial fluid collection. No evidence of an intracranial mass. No midline shift. Vascular: No hyperdense vessel.  Atherosclerotic calcifications. Skull: Normal. Negative for fracture or focal lesion. Sinuses/Orbits: Visualized orbits show no acute finding. Mild mucosal thickening and/or fluid within the bilateral ethmoid sinuses. IMPRESSION: No evidence of acute intracranial abnormality. Mild chronic small vessel ischemic changes within the cerebral white matter. Mild generalized cerebral atrophy. Electronically Signed   By: Kellie Simmering D.O.   On: 10/12/2021 14:34   DG Chest Port 1 View  Result Date: 10/12/2021 CLINICAL DATA:  Shortness of breath EXAM: PORTABLE CHEST 1 VIEW COMPARISON:  09/12/2021 FINDINGS: Cardiac shadow is enlarged. Aortic calcifications are noted. Bilateral airspace opacities have improved somewhat in the interval from the prior exam. Interstitial changes are seen consistent with edema. No sizable effusion is noted. IMPRESSION: Interstitial edema bilaterally. Previously seen opacities have improved somewhat in the interval from the prior study. Electronically Signed   By: Inez Catalina M.D.   On: 10/12/2021 02:34    EKG: Independently reviewed.  NSR with  rate 69; nonspecific ST changes with no evidence of acute ischemia   Labs on Admission: I have personally reviewed the available labs and imaging studies at the time of the admission.  Pertinent labs:   hgb: 9.5,  BUN: 92,  creatinine: 9.53,  AG: 20,  troponin 62>61,    Assessment and Plan:  Assessment and Plan: * Acute on chronic respiratory failure with hypoxia ([place])- (present on admission) 66 year old presenting with acute on chronic respiratory failure satting 70% on 5L and requiring NRM at 15L to maintain oxygenation likely due to volume overload from missed dialysis session -nephrology consulted and has already had dialysis session, could not pull off goal fluid, but she is doing better and now on 7L [place] -likely has an acute on chronic CHF exac as well. Family declining echocardiogram as it will not change management. Volume control per dialysis -continue to wean to home 3-4L   Fall at home, initial encounter No neurological deficits.  Check head CT scan  Family wants her comfortable. Hold on PT at this time. Palliative care following and met with hospice, but holding this at this time.   ESRD on hemodialysis [facility]) Nephrology consulted in Ed and patient underwent dialysis today.  Could not have full amount removed Patient goes to dialysis 4x week due to only tolerating 3 hour sessions Have met with hospice, but not ready to stop HD at this time Continue HD for volume removal per nephrology Improving, but still requiring 7L oxygen from her baseline of 4L.   elevated troponin in setting of known CAD/HLD- (present on admission) -likely secondary to demand ischemia in setting of acute on chronic respiratory failure from volume overload from chf/missing dialysis -continue telemetry and medical management  -Continue statin and ASA Cardiac cath 11/2020: 70% proximal right coronary artery  followed by 50% with otherwise non obstructive disease.   Chronic systolic CHF (congestive  heart failure) ([place])- (present on admission) Likely has an acute on chronic chf exacerbation  Family  Has declined echo at this time  Checking BNP Volume control per dialysis Last echo: 11/2020 EF of 30%. Left ventricle has moderate to severely decreased fx. Global hypokinesis. Grade II diastolic dysfunction  Does not make urine Daily weights  Concern CHF contributing to her overall decline as well.   COPD (chronic obstructive pulmonary disease) ([facility]) No signs of exacerbation at this time. No wheezing or change in cough. Do not think contributing to her acute on chronic respiratory failure Continue home albuterol and spiriva.   Type 2 diabetes mellitus with diabetic neuropathy, unspecified ([place])- (present on admission) a1c of 6.4 in 08/2021 SSI and accuchecks per protocol   Essential hypertension, benign- (present on admission) Continue home medication of coreg, cozaar  Anxiety and depression- (present on admission) continue prozac  Elevated troponin Likely secondary from demand ischemia in setting of acute on chronic respiratory failure Flat No chest pain and no changes on EKG     Advance Care Planning:   Code Status: DNR DNR  Consults: nephrology  DVT Prophylaxis: heparin   Family Communication: daughter at bedside: Tina Gomez   Severity of Illness: The appropriate patient status for this patient is OBSERVATION. Observation status is judged to be reasonable and necessary in order to provide the required intensity of service to ensure the patient's safety. The patient's presenting symptoms, physical exam findings, and initial radiographic and laboratory data in the context of their medical condition is felt to place them at decreased risk for further clinical deterioration. Furthermore, it is anticipated that the patient will be medically stable for discharge from the hospital within 2 midnights of admission.   Author: Orma Flaming, MD 10/12/2021 9:02 PM  For on call  review [email or web address].

## 2021-10-12 NOTE — Assessment & Plan Note (Addendum)
continue prozac

## 2021-10-12 NOTE — Assessment & Plan Note (Addendum)
-  Last echo: 11/2020 EF of 30%. LV moderate to severely decreased fx. Grade II diastolic dysfunction  -Volume managed with HD, see discussion above, continue carvedilol

## 2021-10-12 NOTE — TOC Progression Note (Signed)
Transition of Care [facility]) - Progression Note    Patient Details  Name: Tina Gomez MRN: [id number] Date of Birth: June 08, 1956  Transition of Care [facility]) CM/SW Contact  Zenon Mayo, RN Phone Number: 10/12/2021, 2:47 PM  Clinical Narrative:     Transition of Care [facility]) Screening Note   Patient Details  Name: Tina Gomez Date of Birth: March 22, 1956   Transition of Care [facility]) CM/SW Contact:    Zenon Mayo, RN Phone Number: 10/12/2021, 2:47 PM    Transition of Care Department [facility]) has reviewed patient and no TOC needs have been identified at this time. We will continue to monitor patient advancement through interdisciplinary progression rounds. If new patient transition needs arise, please place a TOC consult.          Expected Discharge Plan and Services                                                 Social Determinants of Health (SDOH) Interventions    Readmission Risk Interventions Readmission Risk Prevention Plan 09/13/2021 04/13/2021 07/28/2020  Transportation Screening Complete Complete Complete  PCP or Specialist Appt within 3-5 Days - - Not Complete  Not Complete comments - - first available PCP is 12/16  [place] or [place] - - Complete  Social Work Consult for [place] Planning/Counseling - - Complete  Palliative Care Screening - - Not Applicable  Medication Review (RN Care Manager) Referral to Pharmacy Complete -  PCP or Specialist appointment within 3-5 days of discharge Complete Complete -  [place] or Home Care Consult Complete Complete -  SW Recovery Care/Counseling Consult Complete Complete -  Palliative Care Screening Complete Not Applicable -  [place] Not Applicable Not Applicable -  Some recent data might be hidden

## 2021-10-12 NOTE — ED Provider Notes (Signed)
1:29 PM Patient seen on return from dialysis.  She continues to require oxygen beyond baseline, now 7 L versus home 4 L.  With persistent dyspnea, fluid overload status, and notation of dialysis note suggesting she did not receive full removal of fluid, patient requires admission to the hospitalist team.  I discussed this with our hospitalist colleagues.   Carmin Muskrat, MD 10/12/21 1330

## 2021-10-12 NOTE — Assessment & Plan Note (Addendum)
-  Stable, chronic respiratory failure on 3-4 L home O2 Continue home albuterol and spiriva.

## 2021-10-12 NOTE — Assessment & Plan Note (Addendum)
a1c of 6.4 in 08/2021 -CBGs are stable, continue SSI

## 2021-10-13 DIAGNOSIS — J9621 Acute and chronic respiratory failure with hypoxia: Secondary | ICD-10-CM

## 2021-10-13 LAB — BASIC METABOLIC PANEL
Anion gap: 13 (ref 5–15)
BUN: 46 mg/dL — ABNORMAL HIGH (ref 8–23)
CO2: 26 mmol/L (ref 22–32)
Calcium: 8.3 mg/dL — ABNORMAL LOW (ref 8.9–10.3)
Chloride: 99 mmol/L (ref 98–111)
Creatinine, Ser: 5.98 mg/dL — ABNORMAL HIGH (ref 0.44–1.00)
GFR, Estimated: 7 mL/min — ABNORMAL LOW (ref >60)
Glucose, Bld: 112 mg/dL — ABNORMAL HIGH (ref 70–99)
Potassium: 3.6 mmol/L (ref 3.5–5.1)
Sodium: 138 mmol/L (ref 135–145)

## 2021-10-13 LAB — CBC
HCT: 31.4 % — ABNORMAL LOW (ref 36.0–46.0)
HCT: 31.5 % — ABNORMAL LOW (ref 36.0–46.0)
Hemoglobin: 9.1 g/dL — ABNORMAL LOW (ref 12.0–15.0)
Hemoglobin: 9.4 g/dL — ABNORMAL LOW (ref 12.0–15.0)
MCH: 27 pg (ref 26.0–34.0)
MCH: 27.4 pg (ref 26.0–34.0)
MCHC: 29 g/dL — ABNORMAL LOW (ref 30.0–36.0)
MCHC: 29.8 g/dL — ABNORMAL LOW (ref 30.0–36.0)
MCV: 93.2 fL (ref 80.0–100.0)
MCV: 91.8 fL (ref 80.0–100.0)
Platelets: 215 10*3/uL (ref 150–400)
Platelets: 209 10*3/uL (ref 150–400)
RBC: 3.37 MIL/uL — ABNORMAL LOW (ref 3.87–5.11)
RBC: 3.43 MIL/uL — ABNORMAL LOW (ref 3.87–5.11)
RDW: 19 % — ABNORMAL HIGH (ref 11.5–15.5)
RDW: 18.7 % — ABNORMAL HIGH (ref 11.5–15.5)
WBC: 6.2 10*3/uL (ref 4.0–10.5)
WBC: 6.7 10*3/uL (ref 4.0–10.5)
nRBC: 0 % (ref 0.0–0.2)
nRBC: 0 % (ref 0.0–0.2)

## 2021-10-13 LAB — GLUCOSE, CAPILLARY
Glucose-Capillary: 99 mg/dL (ref 70–99)
Glucose-Capillary: 147 mg/dL — ABNORMAL HIGH (ref 70–99)
Glucose-Capillary: 130 mg/dL — ABNORMAL HIGH (ref 70–99)
Glucose-Capillary: 260 mg/dL — ABNORMAL HIGH (ref 70–99)

## 2021-10-13 LAB — HEPATITIS B SURFACE ANTIBODY, QUANTITATIVE: Hep B S AB Quant (Post): >1000 m[IU]/mL (ref >9.9)

## 2021-10-13 MED ORDER — HEPARIN SODIUM (PORCINE) 1000 UNIT/ML DIALYSIS
20.0000 [IU]/kg | INTRAMUSCULAR | Status: DC | PRN
  Filled 2021-10-13: qty 2

## 2021-10-13 MED ORDER — CHLORHEXIDINE GLUCONATE CLOTH 2 % EX PADS
6.0000 | MEDICATED_PAD | Freq: Every day | CUTANEOUS | Status: DC
  Administered 2021-10-13: 6 via TOPICAL

## 2021-10-13 MED ORDER — HYDROCORTISONE 1 % EX OINT
TOPICAL_OINTMENT | Freq: Three times a day (TID) | CUTANEOUS | Status: DC | PRN
  Filled 2021-10-13: qty 28

## 2021-10-13 NOTE — Evaluation (Signed)
Physical Therapy Evaluation Patient Details Name: Tina Gomez MRN: [id number] DOB: [birth year] Today's Date: 10/13/2021  History of Present Illness  The pt is a 66 yo female presenting 2/3 with SOB and after missed HD tx on wednesday due to n/v. Work up revealed acute CHF exacerbation with acute resp failure. 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   Clinical Impression  Pt in bed upon arrival of PT, agreeable to evaluation at this time. Prior to admission the pt was mobilizing short distances in her home with use of rollator and 3-4 L O2. The pt reports she spends most of her time in chair or couch, but is able to ambulate when needed to change position in the home as well as to ambulate from car to HD. The pt now presents with limitations in functional mobility, strength, power, and endurance due to above dx, and will continue to benefit from skilled PT to address these deficits. The pt was able to complete sit-stand with no assist, but requires significant increased time to power up due to poor functional power and strength in her LE. She was able to complete a good distance of hallway ambulation (~150 ft) but ambulates with very slow self-selected gait speed and requires 3L O2. The pt appears close to her baseline mobility, but will benefit from skilled PT acutely and HEP for continued LE strength and endurance training to maximize independence following d/c.   Gait Speed: 0.62m/s using rollator and with 3L O2. (Gait speed <0.75m/s indicates increased risk of falls and dependence in ADLs)         Recommendations for follow up therapy are one component of a multi-disciplinary discharge planning process, led by the attending physician.  Recommendations may be updated based on patient status, additional functional criteria and insurance authorization.  Follow Up Recommendations No PT follow up    Assistance  Recommended at Discharge Intermittent Supervision/Assistance  Patient can return home with the following  A little help with walking and/or transfers;A little help with bathing/dressing/bathroom;Help with stairs or ramp for entrance;Assistance with cooking/housework    Equipment Recommendations None recommended by PT  Recommendations for Other Services       Functional Status Assessment Patient has had a recent decline in their functional status and demonstrates the ability to make significant improvements in function in a reasonable and predictable amount of time.     Precautions / Restrictions Precautions Precautions: Other (comment);Fall Precaution Comments: on 3L O2 for mobility Restrictions Weight Bearing Restrictions: No      Mobility  Bed Mobility Overal bed mobility: Independent                  Transfers Overall transfer level: Needs assistance Equipment used: Rollator (4 wheels) Transfers: Sit to/from Stand Sit to Stand: Supervision           General transfer comment: no assist needed, no instability. increased time to power up    Ambulation/Gait Ambulation/Gait assistance: Min guard Gait Distance (Feet): 150 Feet Assistive device: Rollator (4 wheels) Gait Pattern/deviations: Step-through pattern, Decreased stride length, Trunk flexed Gait velocity: 0.25 m/s Gait velocity interpretation: <1.31 ft/sec, indicative of household ambulator   General Gait Details: slowed gait with small steps. no overt LOB and pt denies need for seated rest. VSS on 3L         Balance Overall balance assessment: Mild deficits observed, not formally tested  Pertinent Vitals/Pain Pain Assessment Pain Assessment: Faces Faces Pain Scale: Hurts a little bit Pain Location: RLE Pain Descriptors / Indicators: Discomfort, Grimacing Pain Intervention(s): Monitored during session, Limited activity within patient's  tolerance, Repositioned    Home Living Family/patient expects to be discharged to:: Private residence Living Arrangements: Children Available Help at Discharge: Family;Available 24 hours/day Type of Home: House Home Access: Ramped entrance       Home Layout: Two level;Able to live on main level with bedroom/bathroom Home Equipment: Grab bars - tub/shower;BSC/3in1;Grab bars - toilet;Rollator (4 wheels);Wheelchair - manual;Hand held shower head;Shower seat - built in;Transport chair Additional Comments: pt walks in home with rollator, wiped out after HD but does HEP from PT as often as she can tolerate.    Prior Function Prior Level of Function : Needs assist       Physical Assist : Mobility (physical);ADLs (physical) Mobility (physical): Gait;Stairs ADLs (physical): Bathing;IADLs Mobility Comments: uses rollator, x3 falls reccently. on 3-4 L at home but condenser does not reach everywhere pt needs to go in home ADLs Comments: pt needs assist for LB dressing RLE, supervision for shower.     Hand Dominance   Dominant Hand: Right    Extremity/Trunk Assessment   Upper Extremity Assessment Upper Extremity Assessment: Overall WFL for tasks assessed    Lower Extremity Assessment Lower Extremity Assessment: RLE deficits/detail RLE Deficits / Details: grossly 3/5, pt reports due to past stroke. no instances of buckling. RLE Sensation: WNL RLE Coordination: WNL    Cervical / Trunk Assessment Cervical / Trunk Assessment: Kyphotic  Communication   Communication: No difficulties  Cognition Arousal/Alertness: Awake/alert Behavior During Therapy: WFL for tasks assessed/performed Overall Cognitive Status: Within Functional Limits for tasks assessed                                          General Comments General comments (skin integrity, edema, etc.): VSS on 3L (93-97% with mobility)    Exercises     Assessment/Plan    PT Assessment Patient needs  continued PT services  PT Problem List Decreased strength;Decreased activity tolerance;Decreased balance;Decreased mobility;Cardiopulmonary status limiting activity       PT Treatment Interventions DME instruction;Gait training;Stair training;Functional mobility training;Therapeutic activities;Therapeutic exercise;Cognitive remediation    PT Goals (Current goals can be found in the Care Plan section)  Acute Rehab PT Goals Patient Stated Goal: return home asap PT Goal Formulation: With patient Time For Goal Achievement: 10/27/21 Potential to Achieve Goals: Good    Frequency Min 3X/week        AM-PAC PT "6 Clicks" Mobility  Outcome Measure Help needed turning from your back to your side while in a flat bed without using bedrails?: None Help needed moving from lying on your back to sitting on the side of a flat bed without using bedrails?: None Help needed moving to and from a bed to a chair (including a wheelchair)?: A Little Help needed standing up from a chair using your arms (e.g., wheelchair or bedside chair)?: A Little Help needed to walk in hospital room?: A Little Help needed climbing 3-5 steps with a railing? : A Little 6 Click Score: 20    End of Session Equipment Utilized During Treatment: Gait belt;Oxygen Activity Tolerance: Patient tolerated treatment well Patient left: in bed;with call bell/phone within reach;with family/visitor present Nurse Communication: Mobility status PT Visit Diagnosis: Other abnormalities of gait and mobility (R26.89)  Time: 5789-7847 PT Time Calculation (min) (ACUTE ONLY): 30 min   Charges:   PT Evaluation $PT Eval Low Complexity: 1 Low PT Treatments $Therapeutic Exercise: 8-22 mins        West Carbo, PT, DPT   Acute Rehabilitation Department Pager #: [phone]  Sandra Cockayne 10/13/2021, 12:03 PM

## 2021-10-13 NOTE — Progress Notes (Signed)
[facility] Renal Consultation Note    Indication for Consultation:  Management of ESRD/hemodialysis, anemia, hypertension/volume, and secondary hyperparathyroidism. PCP:  HPI: Tina Gomez is a 66 y.o. female with ESRD, T2DM, HTN, RLS, Hx CVA, HFrEF (EF 30%), and COPD on home O2 who was admitted OBS status with dyspnea/acute hypoxic respiratory failure.  Had presented to ED on 2/3 with 1 day of acutely worsened dyspnea in setting of missed dialysis as well as fall at home. Found to be hypoxic with O2 sat 86% requiring NRB oxygen mask. She was brought to dialysis unit and underwent 3hr session with net UF 1.8L. After HD, she was returned to the ED but still had a higher O2 requirement, therefore she was admitted. Overnight, she continued to have mild dyspnea. Today, her O2 has been titrated down to her home dose but she still has bibasilar crackles on exam. Plan is for HD again today for furthered UF.   She c/o chronic cough and occ nausea, otherwise no new concerns. No CP, abdominal pain, diarrhea, vomiting, fever, or chills.  Past Medical History:  Diagnosis Date   Anemia    Chronic kidney disease    COPD (chronic obstructive pulmonary disease) ([place]) 05/2019   no inhaler   Diabetes mellitus without complication ([place])    no meds - diet controlled   History of blood transfusion 09/2020   1 unit   HLD (hyperlipidemia)    Hypertension    PONV (postoperative nausea and vomiting)    Restless legs syndrome (RLS)    Stroke [facility])    Past Surgical History:  Procedure Laterality Date   AV FISTULA PLACEMENT Left 07/27/2020   Procedure: LEFT BRACHIO-BASILIC ARTERIOVENOUS (AV) FISTULA CREATION;  Surgeon: Cherre Robins, MD;  Location: [place];  Service: Vascular;  Laterality: Left;   [place] Left 11/02/2020   Procedure: LEFT UPPER ARM SECOND STAGE BASILIC VEIN TRANSPOSITION;  Surgeon: Cherre Robins, MD;  Location: [place];  Service: Vascular;  Laterality:  Left;  PERIPHERAL NERVE BLOCK   CESAREAN SECTION     x 1   ESOPHAGOGASTRODUODENOSCOPY (EGD) WITH PROPOFOL N/A 05/26/2020   Procedure: ESOPHAGOGASTRODUODENOSCOPY (EGD) WITH PROPOFOL;  Surgeon: Irene Shipper, MD;  Location: [place];  Service: Endoscopy;  Laterality: N/A;   INSERTION OF DIALYSIS CATHETER Right 07/27/2020   Procedure: INSERTION OF RIGHT INTERNAL JUGULAR TUNNELED DIALYSIS CATHETER;  Surgeon: Cherre Robins, MD;  Location: [place];  Service: Vascular;  Laterality: Right;   IR FLUORO GUIDE CV LINE RIGHT  07/25/2020   IR US GUIDE VASC ACCESS RIGHT  07/25/2020   LEFT HEART CATH AND CORONARY ANGIOGRAPHY N/A 11/28/2020   Procedure: LEFT HEART CATH AND CORONARY ANGIOGRAPHY;  Surgeon: Troy Sine, MD;  Location: [place] CV LAB;  Service: Cardiovascular;  Laterality: N/A;   TONSILLECTOMY     UPPER GI ENDOSCOPY     growth removed from voice box   WRIST SURGERY Left    ganglion cyst removal   Family History  Problem Relation Age of Onset   Stroke Mother    Hypertension Mother    Hyperlipidemia Mother    Hypertension Father    Hyperlipidemia Father    Social History:  reports that she has been smoking cigarettes. She has a 47.00 pack-year smoking history. She has never used smokeless tobacco. She reports that she does not drink alcohol and does not use drugs.  ROS: As per HPI otherwise negative.  Physical Exam: Vitals:   10/13/21 0500  10/13/21 0628 10/13/21 0758 10/13/21 1125  BP:   (!) 171/85 (!) 156/58  Pulse:   61 62  Resp:   18 18  Temp:   98.3 F (36.8 C) 97.7 F (36.5 C)  TempSrc:   Oral Oral  SpO2: 97% 93% 94%   Weight:      Height:         General: Well developed, well nourished, in no acute distress. On nasal O2 3L/min Head: Normocephalic, atraumatic, sclera non-icteric, mucus membranes are moist. Neck: Supple without lymphadenopathy/masses. JVD not elevated. Lungs: Bibasilar rales present Heart: RRR with normal S1, S2. No murmurs, rubs, or gallops  appreciated. Abdomen: Soft, non-tender, non-distended with normoactive bowel sounds.  Musculoskeletal:  Strength and tone appear normal for age. Lower extremities: No edema or ischemic changes, no open wounds. Neuro: Alert and oriented X 3. Moves all extremities spontaneously. Psych:  Responds to questions appropriately with a normal affect. Dialysis Access: LUE AVF + bruit  Allergies  Allergen Reactions   Codeine Nausea And Vomiting   Adhesive [Tape] Other (See Comments)    Tape breaks out the skin if it is left on for a lengthy period of time   Prior to Admission medications   Medication Sig Start Date End Date Taking? Authorizing Provider  acetaminophen (TYLENOL) 500 MG tablet Take 1,000 mg by mouth every 6 (six) hours as needed for mild pain or headache.   Yes [provider]  albuterol (PROVENTIL) (2.5 MG/3ML) 0.083% nebulizer solution Take 3 mLs (2.5 mg total) by nebulization every 6 (six) hours as needed for wheezing or shortness of breath. Patient taking differently: Take 2.5 mg by nebulization at bedtime. 07/12/21  Yes Charlott Rakes, MD  albuterol (VENTOLIN HFA) 108 (90 Base) MCG/ACT inhaler Inhale 2 puffs into the lungs every 6 (six) hours as needed for wheezing or shortness of breath. 07/11/21  Yes Newlin, Charlane Ferretti, MD  Aspirin-Caffeine (BAYER BACK & BODY PO) Take 1 tablet by mouth daily as needed (headache/pain).   Yes [provider]  atorvastatin (LIPITOR) 40 MG tablet Take 1 tablet (40 mg total) by mouth daily at 6 PM. 04/15/21  Yes Jennye Boroughs, MD  B Complex-C-Zn-Folic Acid (DIALYVITE 060 WITH ZINC) 0.8 MG TABS Take 1 tablet by mouth at bedtime. 08/12/20  Yes [provider]  calcium acetate (PHOSLO) 667 MG capsule Take 2 capsules (1,334 mg total) by mouth with breakfast, with lunch, and with evening meal. Patient taking differently: Take 1,334-2,001 mg by mouth See admin instructions. Take 3 capsules (2001 mg) by mouth three times daily before meals  and 2 capsule (1334 mg) twice daily before snack 09/13/21 10/13/21 Yes Chen, Eric, DO  carvedilol (COREG) 12.5 MG tablet Take 1 tablet (12.5 mg total) by mouth 2 (two) times daily with a meal. Patient taking differently: Take 12.5 mg by mouth See admin instructions. Take one tablet (12.5 mg) by mouth on Sunday, Tuesday, Thursday mornings (non-dialysis days); take one tablet (12.5 mg) daily at bedtime 04/15/21 04/15/22 Yes Jennye Boroughs, MD  FLUoxetine (PROZAC) 20 MG capsule Take 1 capsule (20 mg total) by mouth daily. Patient taking differently: Take 20 mg by mouth at bedtime. 07/11/21  Yes Charlott Rakes, MD  gabapentin (NEURONTIN) 100 MG capsule Take 1 capsule (100 mg total) by mouth 2 (two) times daily. Patient taking differently: Take 200 mg by mouth at bedtime. 10/01/21  Yes Charlott Rakes, MD  lidocaine-prilocaine (EMLA) cream Apply 1 application topically See admin instructions. Apply topically to port access one hour  prior to dialysis - Monday, Wednesday, Friday, Saturday 12/04/20  Yes [provider]  loperamide (IMODIUM A-D) 2 MG tablet Take 1 tablet (2 mg total) by mouth 4 (four) times daily as needed for diarrhea or loose stools. Patient taking differently: Take 2 mg by mouth See admin instructions. Take one tablet (2 mg) by mouth prior to dialysis on Monday, Wednesday, Friday and Saturday 09/26/21  Yes Newlin, Charlane Ferretti, MD  losartan (COZAAR) 100 MG tablet Take 1 tablet (100 mg total) by mouth every evening. Patient taking differently: Take 100 mg by mouth See admin instructions. Take one tablet (100 mg) by mouth at noon on Sunday, Tuesday, Thursday (non-dialysis days); take one tablet (100 mg) at bedtime on Monday, Wednesday, Friday, Saturday (dialysis days) 05/01/21  Yes Charlott Rakes, MD  ondansetron (ZOFRAN) 4 MG tablet Take 1 tablet (4 mg total) by mouth every 8 (eight) hours as needed for nausea or vomiting. 06/19/21  Yes Charlott Rakes, MD  OXYGEN Inhale 4 L/min into the lungs  continuous.   Yes [provider]  Tiotropium Bromide Monohydrate (SPIRIVA RESPIMAT) 2.5 MCG/ACT AERS Inhale 2 puffs into the lungs daily. 01/30/21  Yes Charlott Rakes, MD  Blood Glucose Monitoring Suppl (BLOOD GLUCOSE METER) kit Use as instructed 11/01/13   Reyne Dumas, MD  Blood Glucose Monitoring Suppl (TRUE METRIX METER) DEVI 1 each by Does not apply route 3 (three) times daily before meals. 06/02/20   Charlott Rakes, MD  glucose blood (TRUETEST TEST) test strip Use as instructed 06/02/20   Charlott Rakes, MD  Lancets (FREESTYLE) lancets Use as instructed 11/01/13   Reyne Dumas, MD  Misc. Devices MISC Large depends. Diagnosis - fecal incontinence 01/30/21   Charlott Rakes, MD  Misc. Devices MISC Rolling walker with seat 08/14/21   Charlott Rakes, MD  Misc. Devices MISC Wheelchair with Accessories: elevating leg rests (ELRs), wheel locks, extensions and anti-tippers. Cane or walker will not suffice 08/14/21   Charlott Rakes, MD  Misc. Devices MISC Portable oxygen concentrator. Chronic respiratory failure - 4L oxygen qhs and prn 08/14/21   Charlott Rakes, MD  TRUEplus Lancets 28G MISC 1 each by Does not apply route 3 (three) times daily before meals. 06/02/20   Charlott Rakes, MD  amLODipine (NORVASC) 10 MG tablet Take 1 tablet (10 mg total) by mouth daily. 08/01/20 11/09/20  Terrilee Croak, MD  omeprazole (PRILOSEC) 20 MG capsule TAKE 1 CAPSULE (20 MG TOTAL) BY MOUTH 2 (TWO) TIMES DAILY BEFORE A MEAL. 08/01/20 08/22/20  Charlott Rakes, MD   Current Facility-Administered Medications  Medication Dose Route Frequency Provider Last Rate Last Admin   0.9 %  sodium chloride infusion  100 mL Intravenous PRN Madelon Lips, MD       0.9 %  sodium chloride infusion  100 mL Intravenous PRN Madelon Lips, MD       0.9 %  sodium chloride infusion  250 mL Intravenous PRN Orma Flaming, MD       acetaminophen (TYLENOL) tablet 650 mg  650 mg Oral Q6H PRN Orma Flaming, MD       Or    acetaminophen (TYLENOL) suppository 650 mg  650 mg Rectal Q6H PRN Orma Flaming, MD       albuterol (PROVENTIL) (2.5 MG/3ML) 0.083% nebulizer solution 2.5 mg  2.5 mg Nebulization QHS Orma Flaming, MD   2.5 mg at 10/12/21 2004   albuterol (PROVENTIL) (2.5 MG/3ML) 0.083% nebulizer solution 2.5 mg  2.5 mg Nebulization Q6H PRN Donnamae Jude, [facility]  alteplase (CATHFLO ACTIVASE) injection 2 mg  2 mg Intracatheter Once PRN Madelon Lips, MD       atorvastatin (LIPITOR) tablet 40 mg  40 mg Oral q1800 Orma Flaming, MD   40 mg at 10/12/21 1728   calcium acetate (PHOSLO) capsule 1,334 mg  1,334 mg Oral BID BM PRN Donnamae Jude, RPH       calcium acetate (PHOSLO) capsule 2,001 mg  2,001 mg Oral TID with meals Orma Flaming, MD   2,001 mg at 10/13/21 1200   carvedilol (COREG) tablet 12.5 mg  12.5 mg Oral QHS Orma Flaming, MD   12.5 mg at 10/12/21 2056   [START ON 10/14/2021] carvedilol (COREG) tablet 12.5 mg  12.5 mg Oral Once per day on Sun Tue Thu Chen, Lydia D, [facility]       Chlorhexidine Gluconate Cloth 2 % PADS 6 each  6 each Topical Q0600 Loren Racer, PA-C       FLUoxetine (PROZAC) capsule 20 mg  20 mg Oral QHS Orma Flaming, MD   20 mg at 10/12/21 2056   gabapentin (NEURONTIN) capsule 200 mg  200 mg Oral QHS Orma Flaming, MD   200 mg at 10/12/21 2056   heparin injection 1,000 Units  1,000 Units Dialysis PRN Madelon Lips, MD       heparin injection 1,600 Units  20 Units/kg Dialysis PRN Loren Racer, PA-C       heparin injection 5,000 Units  5,000 Units Subcutaneous Q8H Orma Flaming, MD   5,000 Units at 10/13/21 0610   insulin aspart (novoLOG) injection 0-6 Units  0-6 Units Subcutaneous TID WC Orma Flaming, MD   1 Units at 10/12/21 1729   lidocaine (PF) (XYLOCAINE) 1 % injection 5 mL  5 mL Intradermal PRN Madelon Lips, MD       lidocaine-prilocaine (EMLA) cream 1 application  1 application Topical PRN Madelon Lips, MD       loperamide (IMODIUM) capsule 2 mg  2 mg  Oral Once per day on Mon Wed Fri Sat Orma Flaming, MD   2 mg at 10/13/21 0754   [START ON 10/14/2021] losartan (COZAAR) tablet 100 mg  100 mg Oral Once per day on Sun Tue Thu Wolfe, Allison, MD       losartan (COZAAR) tablet 100 mg  100 mg Oral Once per day on Mon Wed Fri Sat Donnamae Jude, RPH   100 mg at 10/12/21 2056   multivitamin (RENA-VIT) tablet 1 tablet  1 tablet Oral QHS Donnamae Jude, [facility]   1 tablet at 10/12/21 2056   ondansetron [facility]) tablet 4 mg  4 mg Oral Q6H PRN Orma Flaming, MD       Or   ondansetron [facility]) injection 4 mg  4 mg Intravenous Q6H PRN Orma Flaming, MD   4 mg at 10/13/21 1203   pentafluoroprop-tetrafluoroeth (GEBAUERS) aerosol 1 application  1 application Topical PRN Madelon Lips, MD       sodium chloride flush (NS) 0.9 % injection 3 mL  3 mL Intravenous Q12H Orma Flaming, MD   3 mL at 10/13/21 0928   sodium chloride flush (NS) 0.9 % injection 3 mL  3 mL Intravenous PRN Orma Flaming, MD       umeclidinium bromide (INCRUSE ELLIPTA) 62.5 MCG/ACT 1 puff  1 puff Inhalation Daily Donnamae Jude [facility]       Labs: Basic Metabolic Panel: Recent Labs  Lab 10/12/21 0222 10/13/21 0317  NA 137 138  K 3.9 3.6  CL 95* 99  CO2 22 26  GLUCOSE 170* 112*  BUN 92* 46*  CREATININE 9.53* 5.98*  CALCIUM 8.2* 8.3*   CBC: Recent Labs  Lab 10/12/21 0222 10/13/21 0317 10/13/21 1042  WBC 9.6 6.2 6.7  NEUTROABS 7.2  --   --   HGB 9.5* 9.1* 9.4*  HCT 32.7* 31.4* 31.5*  MCV 92.1 93.2 91.8  PLT 228 215 209   CBG: Recent Labs  Lab 10/12/21 1546 10/12/21 2054 10/13/21 0553 10/13/21 1124  GLUCAP 162* 210* 99 147*   Studies/Results: CT HEAD WO CONTRAST (5MM)  Result Date: 10/12/2021 CLINICAL DATA:  History: Head trauma, moderate/severe.  Provided EXAM: CT HEAD WITHOUT CONTRAST TECHNIQUE: Contiguous axial images were obtained from the base of the skull through the vertex without intravenous contrast. RADIATION DOSE REDUCTION: This exam was performed according  to the departmental dose-optimization program which includes automated exposure control, adjustment of the mA and/or kV according to patient size and/or use of iterative reconstruction technique. COMPARISON:  Brain MRI 08/03/2021.  Head CT 08/03/2021. FINDINGS: Brain: Mild generalized cerebral atrophy. Mild patchy and ill-defined hypoattenuation within the cerebral white matter, nonspecific but compatible with chronic small vessel ischemic disease. There is no acute intracranial hemorrhage. No demarcated cortical infarct. No extra-axial fluid collection. No evidence of an intracranial mass. No midline shift. Vascular: No hyperdense vessel.  Atherosclerotic calcifications. Skull: Normal. Negative for fracture or focal lesion. Sinuses/Orbits: Visualized orbits show no acute finding. Mild mucosal thickening and/or fluid within the bilateral ethmoid sinuses. IMPRESSION: No evidence of acute intracranial abnormality. Mild chronic small vessel ischemic changes within the cerebral white matter. Mild generalized cerebral atrophy. Electronically Signed   By: Kellie Simmering D.O.   On: 10/12/2021 14:34   DG Chest Port 1 View  Result Date: 10/12/2021 CLINICAL DATA:  Shortness of breath EXAM: PORTABLE CHEST 1 VIEW COMPARISON:  09/12/2021 FINDINGS: Cardiac shadow is enlarged. Aortic calcifications are noted. Bilateral airspace opacities have improved somewhat in the interval from the prior exam. Interstitial changes are seen consistent with edema. No sizable effusion is noted. IMPRESSION: Interstitial edema bilaterally. Previously seen opacities have improved somewhat in the interval from the prior study. Electronically Signed   By: Inez Catalina M.D.   On: 10/12/2021 02:34    Dialysis Orders:  MWFSa at [facility] 3hr, 400/500, EDW 75kg, 3K/2.5Ca, UFP #2, AVF, heparin 5000 + 2000 mid-run bolus - Hectoral 58mg IV q HD - Mircera 2219m IV q 2 weeks  Assessment/Plan:  Acute Hypoxic Respiratory Failure: S/p HD 2/3 with 1.8L UF  with only partial relief, for HD again today for further volume removal.  ESRD:  Usual MWFSa schedule d/t poor tolerance of HD in setting of HF. UF as tolerated today, uses midodrine prn for hypotension.  Hypertension/volume: BP high at the moment, standing weight this AM was 77.8kg -> try to get down to dry weight of 75kg today.  Anemia: Hgb 9.4 - not due for ESA yet.  Metabolic bone disease: Ca ok, continue home meds.  HFrEF  COPD T2DM  KaVeneta PentonPA-C 10/13/2021, 12:15 PM  [employer]

## 2021-10-13 NOTE — Progress Notes (Signed)
PROGRESS NOTE    Tina Gomez  RAX:[id number] DOB: Dec 14, 1955 DOA: 10/12/2021 PCP: Charlott Rakes, MD  Brief Narrative: Chronically ill 65/F with history of COPD/chronic respiratory failure on 3-4 L home O2, ESRD on HD MWF S, DM2, CVA, chronic CAD, chronic systolic CHF presented to the ED with shortness of breath after missing dialysis on Wednesday. -Recently saw hospice on Tuesday, not ready to stop HD, remains DNR, goal for conservative management -In the ED was hypoxic requiring nonrebreather mask initially and then 6 to 7 L, chest x-ray noted interstitial edema, troponin 62, hemoglobin 9.5   Subjective: -Feels better, not back to baseline, still has cough and dyspnea with activity  Assessment and Plan: * Acute on chronic respiratory failure with hypoxia ([place])- (present on admission) Pulmonary edema from missed HD  Acute on chronic systolic CHF  -Improving, undergoes dialysis 4X per week  -Nephrology consulting, plan for HD today  -Wean O2 down to 3-4 L  -Ambulate, PT eval   Chronic systolic CHF (congestive heart failure) ([place])- (present on admission) -Last echo: 11/2020 EF of 30%. LV moderate to severely decreased fx. Grade II diastolic dysfunction  -Volume managed with HD, see discussion above, continue carvedilol   ESRD on hemodialysis [facility]) -Nephrology consulting, had dialysis yesterday  -Plan for repeat HD today  -Recently met w/ palliative care team  elevated troponin in setting of known CAD/HLD- (present on admission) -Secondary to demand ischemia in setting of pulmonary edema, no symptoms of ACS, flat trend Cardiac cath 11/2020: 70% proximal right coronary artery  followed by 50% with otherwise non obstructive disease.  -Continue Coreg, statin  COPD (chronic obstructive pulmonary disease) (HCC) -Stable, chronic respiratory failure on 3-4 L home O2 Continue home albuterol and spiriva.   Type 2 diabetes mellitus with diabetic neuropathy, unspecified ([place])- (present on  admission) a1c of 6.4 in 08/2021 -CBGs are stable, continue SSI  Fall at home, initial encounter - PT eval  Anxiety and depression- (present on admission) continue prozac  Essential hypertension, benign- (present on admission) Continue home medication of coreg, cozaar   DVT prophylaxis: Heparin subcutaneous Code Status: DNR Family Communication: Daughter at bedside Disposition Plan: Home tomorrow  Consultants:  Nephrology  Procedures:   Antimicrobials:    Objective: Vitals:   10/13/21 0458 10/13/21 0500 10/13/21 0628 10/13/21 0758  BP: (!) 146/48   (!) 171/85  Pulse: (!) 58   61  Resp:    18  Temp: 98.6 F (37 C)   98.3 F (36.8 C)  TempSrc: Oral   Oral  SpO2: 96% 97% 93% 94%  Weight: 77.7 kg     Height:        Intake/Output Summary (Last 24 hours) at 10/13/2021 1118 Last data filed at 10/13/2021 0800 Gross per 24 hour  Intake 1083 ml  Output 300 ml  Net 783 ml   Filed Weights   10/12/21 0204 10/13/21 0458  Weight: 77.1 kg 77.7 kg    Examination:  General exam: Pleasant chronically ill female sitting up in bed, AAO x3 HEENT: Positive JVD CVS: S1-S2, regular rhythm Lungs: Few basilar rales Abdomen: Soft, obese, nontender, bowel sounds present Extremities: No edema  Psychiatry:  Mood & affect appropriate.     Data Reviewed:   CBC: Recent Labs  Lab 10/12/21 0222 10/13/21 0317 10/13/21 1042  WBC 9.6 6.2 6.7  NEUTROABS 7.2  --   --   HGB 9.5* 9.1* 9.4*  HCT 32.7* 31.4* 31.5*  MCV 92.1 93.2 91.8  PLT 228 215  109   Basic Metabolic Panel: Recent Labs  Lab 10/12/21 0222 10/13/21 0317  NA 137 138  K 3.9 3.6  CL 95* 99  CO2 22 26  GLUCOSE 170* 112*  BUN 92* 46*  CREATININE 9.53* 5.98*  CALCIUM 8.2* 8.3*   GFR: Estimated Creatinine Clearance: 9.2 mL/min (A) (by C-G formula based on SCr of 5.98 mg/dL (H)). Liver Function Tests: No results for input(s): AST, ALT, ALKPHOS, BILITOT, PROT, ALBUMIN in the last 168 hours. No results for  input(s): LIPASE, AMYLASE in the last 168 hours. No results for input(s): AMMONIA in the last 168 hours. Coagulation Profile: No results for input(s): INR, PROTIME in the last 168 hours. Cardiac Enzymes: No results for input(s): CKTOTAL, CKMB, CKMBINDEX, TROPONINI in the last 168 hours. BNP (last 3 results) No results for input(s): PROBNP in the last 8760 hours. HbA1C: No results for input(s): HGBA1C in the last 72 hours. CBG: Recent Labs  Lab 10/12/21 1546 10/12/21 2054 10/13/21 0553  GLUCAP 162* 210* 99   Lipid Profile: No results for input(s): CHOL, HDL, LDLCALC, TRIG, CHOLHDL, LDLDIRECT in the last 72 hours. Thyroid Function Tests: No results for input(s): TSH, T4TOTAL, FREET4, T3FREE, THYROIDAB in the last 72 hours. Anemia Panel: No results for input(s): VITAMINB12, FOLATE, FERRITIN, TIBC, IRON, RETICCTPCT in the last 72 hours. Urine analysis:    Component Value Date/Time   COLORURINE YELLOW 07/23/2020 0146   APPEARANCEUR CLOUDY (A) 07/23/2020 0146   LABSPEC 1.010 07/23/2020 0146   PHURINE 5.0 07/23/2020 0146   GLUCOSEU 50 (A) 07/23/2020 0146   HGBUR MODERATE (A) 07/23/2020 0146   BILIRUBINUR NEGATIVE 07/23/2020 0146   KETONESUR NEGATIVE 07/23/2020 0146   PROTEINUR >=300 (A) 07/23/2020 0146   UROBILINOGEN 1.0 10/22/2013 1626   NITRITE NEGATIVE 07/23/2020 0146   LEUKOCYTESUR NEGATIVE 07/23/2020 0146   Sepsis Labs: @LABRCNTIP (procalcitonin:4,lacticidven:4)  ) Recent Results (from the past 240 hour(s))  Resp Panel by RT-PCR (Flu A&B, Covid) Nasopharyngeal Swab     Status: None   Collection Time: 10/12/21  3:25 AM   Specimen: Nasopharyngeal Swab; Nasopharyngeal(NP) swabs in vial transport medium  Result Value Ref Range Status   SARS Coronavirus 2 by RT PCR NEGATIVE NEGATIVE Final    Comment: (NOTE) SARS-CoV-2 target nucleic acids are NOT DETECTED.  The SARS-CoV-2 RNA is generally detectable in upper respiratory specimens during the acute phase of infection. The  lowest concentration of SARS-CoV-2 viral copies this assay can detect is 138 copies/mL. A negative result does not preclude SARS-Cov-2 infection and should not be used as the sole basis for treatment or other patient management decisions. A negative result may occur with  improper specimen collection/handling, submission of specimen other than nasopharyngeal swab, presence of viral mutation(s) within the areas targeted by this assay, and inadequate number of viral copies(<138 copies/mL). A negative result must be combined with clinical observations, patient history, and epidemiological information. The expected result is Negative.  Fact Sheet for Patients:  EntrepreneurPulse.com.au  Fact Sheet for Healthcare Providers:  IncredibleEmployment.be  This test is no t yet approved or cleared by the [place] FDA and  has been authorized for detection and/or diagnosis of SARS-CoV-2 by FDA under an Emergency Use Authorization (EUA). This EUA will remain  in effect (meaning this test can be used) for the duration of the COVID-19 declaration under Section 564(b)(1) of the Act, 21 U.S.C.section 360bbb-3(b)(1), unless the authorization is terminated  or revoked sooner.       Influenza A by PCR NEGATIVE NEGATIVE Final  Influenza B by PCR NEGATIVE NEGATIVE Final    Comment: (NOTE) The Xpert Xpress SARS-CoV-2/FLU/RSV plus assay is intended as an aid in the diagnosis of influenza from Nasopharyngeal swab specimens and should not be used as a sole basis for treatment. Nasal washings and aspirates are unacceptable for Xpert Xpress SARS-CoV-2/FLU/RSV testing.  Fact Sheet for Patients: EntrepreneurPulse.com.au  Fact Sheet for Healthcare Providers: IncredibleEmployment.be  This test is not yet approved or cleared by the [place] FDA and has been authorized for detection and/or diagnosis of SARS-CoV-2 by FDA under  an Emergency Use Authorization (EUA). This EUA will remain in effect (meaning this test can be used) for the duration of the COVID-19 declaration under Section 564(b)(1) of the Act, 21 U.S.C. section 360bbb-3(b)(1), unless the authorization is terminated or revoked.  Performed at [facility], [place] [address], [place], [place] [address]      Radiology Studies: CT HEAD WO CONTRAST (5MM)  Result Date: 10/12/2021 CLINICAL DATA:  History: Head trauma, moderate/severe.  Provided EXAM: CT HEAD WITHOUT CONTRAST TECHNIQUE: Contiguous axial images were obtained from the base of the skull through the vertex without intravenous contrast. RADIATION DOSE REDUCTION: This exam was performed according to the departmental dose-optimization program which includes automated exposure control, adjustment of the mA and/or kV according to patient size and/or use of iterative reconstruction technique. COMPARISON:  Brain MRI 08/03/2021.  Head CT 08/03/2021. FINDINGS: Brain: Mild generalized cerebral atrophy. Mild patchy and ill-defined hypoattenuation within the cerebral white matter, nonspecific but compatible with chronic small vessel ischemic disease. There is no acute intracranial hemorrhage. No demarcated cortical infarct. No extra-axial fluid collection. No evidence of an intracranial mass. No midline shift. Vascular: No hyperdense vessel.  Atherosclerotic calcifications. Skull: Normal. Negative for fracture or focal lesion. Sinuses/Orbits: Visualized orbits show no acute finding. Mild mucosal thickening and/or fluid within the bilateral ethmoid sinuses. IMPRESSION: No evidence of acute intracranial abnormality. Mild chronic small vessel ischemic changes within the cerebral white matter. Mild generalized cerebral atrophy. Electronically Signed   By: Kellie Simmering D.O.   On: 10/12/2021 14:34   DG Chest Port 1 View  Result Date: 10/12/2021 CLINICAL DATA:  Shortness of breath EXAM: PORTABLE CHEST 1 VIEW COMPARISON:   09/12/2021 FINDINGS: Cardiac shadow is enlarged. Aortic calcifications are noted. Bilateral airspace opacities have improved somewhat in the interval from the prior exam. Interstitial changes are seen consistent with edema. No sizable effusion is noted. IMPRESSION: Interstitial edema bilaterally. Previously seen opacities have improved somewhat in the interval from the prior study. Electronically Signed   By: Inez Catalina M.D.   On: 10/12/2021 02:34     Scheduled Meds:  albuterol  2.5 mg Nebulization QHS   atorvastatin  40 mg Oral q1800   calcium acetate  2,001 mg Oral TID with meals   carvedilol  12.5 mg Oral QHS   [START ON 10/14/2021] carvedilol  12.5 mg Oral Once per day on Sun Tue Thu   Chlorhexidine Gluconate Cloth  6 each Topical Q0600   FLUoxetine  20 mg Oral QHS   gabapentin  200 mg Oral QHS   heparin  5,000 Units Subcutaneous Q8H   insulin aspart  0-6 Units Subcutaneous TID WC   loperamide  2 mg Oral Once per day on Mon Wed Fri Sat   [START ON 10/14/2021] losartan  100 mg Oral Once per day on Sun Tue Thu   losartan  100 mg Oral Once per day on Mon Wed Fri Sat   multivitamin  1 tablet Oral QHS   sodium chloride flush  3 mL Intravenous Q12H   umeclidinium bromide  1 puff Inhalation Daily   Continuous Infusions:  sodium chloride     sodium chloride     sodium chloride       LOS: 0 days    Time spent: 59mn    PDomenic Polite MD [facility]   10/13/2021, 11:18 AM

## 2021-10-13 NOTE — Plan of Care (Signed)
?  Problem: Education: ?Goal: Ability to demonstrate management of disease process will improve ?Outcome: Progressing ?  ?Problem: Activity: ?Goal: Capacity to carry out activities will improve ?Outcome: Progressing ?  ?Problem: Cardiac: ?Goal: Ability to achieve and maintain adequate cardiopulmonary perfusion will improve ?Outcome: Progressing ?  ?

## 2021-10-13 NOTE — Procedures (Signed)
Patient seen on Hemodialysis. BP (!) 174/84    Pulse 63    Temp 98 F (36.7 C) (Oral)    Resp 16    Ht 5\' 2"  (1.575 m)    Wt 79 kg    LMP  (LMP Unknown)    SpO2 97%    BMI 31.85 kg/m   QB 400, UF goal 4L Tolerating treatment without complaints at this time.   Elmarie Shiley MD [facility]. Office # [phone] Pager # [phone] 3:17 PM

## 2021-10-13 NOTE — Plan of Care (Signed)
  Problem: Clinical Measurements: Goal: Diagnostic test results will improve Outcome: Progressing   Problem: Activity: Goal: Risk for activity intolerance will decrease Outcome: Progressing   Problem: Coping: Goal: Level of anxiety will decrease Outcome: Progressing   Problem: Pain Managment: Goal: General experience of comfort will improve Outcome: Progressing   Problem: Safety: Goal: Ability to remain free from injury will improve Outcome: Progressing   

## 2021-10-14 DIAGNOSIS — J9621 Acute and chronic respiratory failure with hypoxia: Secondary | ICD-10-CM | POA: Diagnosis not present

## 2021-10-14 LAB — GLUCOSE, CAPILLARY
Glucose-Capillary: 108 mg/dL — ABNORMAL HIGH (ref 70–99)
Glucose-Capillary: 214 mg/dL — ABNORMAL HIGH (ref 70–99)

## 2021-10-14 MED ORDER — CALCIUM ACETATE (PHOS BINDER) 667 MG PO CAPS
1334.0000 mg | ORAL_CAPSULE | Freq: Three times a day (TID) | ORAL | 0 refills | Status: AC
Start: 2021-10-14 — End: 2021-11-13
  Filled 2021-10-14: qty 270, 20d supply, fill #0

## 2021-10-14 NOTE — Progress Notes (Signed)
Patient ID: Tina Gomez, female   DOB: [birth year], 66 y.o.   MRN: [id number] [facility] Progress Note   Assessment/ Plan:    Acute on chronic hypoxic Respiratory Failure: She underwent serial dialysis on Friday and Saturday with aggressive ultrafiltration/volume unloading.  Her respiratory status is back to baseline with ongoing supplementation via nasal cannula at 3 L/min.  ESRD:  Usual MWFSa schedule due to challenges with ultrafiltration/volume management as an outpatient.  Underwent serial dialysis on Friday and Saturday with subjective and objective improvement of respiratory status/oxygenation.  She appears stable to discharge home today and resume outpatient dialysis tomorrow.  Hypertension/volume: Blood pressures improving with ultrafiltration/hemodialysis-standing weight 75.3 kg which was closer to her EDW of 75.  Anemia: Resume ESA as an outpatient with hemodialysis..  Metabolic bone disease: Ca ok, continue home meds.  HFrEF  COPD T2DM   Subjective:   Breathing back at baseline, completed dialysis yesterday evening without problems in for dialysis.   Objective:   BP (!) 161/54 (BP Location: Right Arm)    Pulse (!) 56    Temp 97.8 F (36.6 C) (Oral)    Resp 18    Ht 5\' 2"  (4.098 m)    Wt 75.3 kg    LMP  (LMP Unknown)    SpO2 99%    BMI 30.36 kg/m   Physical Exam: Gen: Comfortably sitting up on the edge of her bed, daughter at bedside CVS: Pulse regular bradycardia, S1 and S2 normal Resp: Clear to auscultation with distant breath sounds bilaterally, no rales/rhonchi Abd: Soft, flat, nontender, bowel sounds normal Ext: No lower extremity edema  Labs: BMET Recent Labs  Lab 10/12/21 0222 10/13/21 0317  NA 137 138  K 3.9 3.6  CL 95* 99  CO2 22 26  GLUCOSE 170* 112*  BUN 92* 46*  CREATININE 9.53* 5.98*  CALCIUM 8.2* 8.3*   CBC Recent Labs  Lab 10/12/21 0222 10/13/21 0317 10/13/21 1042  WBC 9.6 6.2 6.7  NEUTROABS 7.2  --   --   HGB 9.5* 9.1*  9.4*  HCT 32.7* 31.4* 31.5*  MCV 92.1 93.2 91.8  PLT 228 215 209     Medications:     albuterol  2.5 mg Nebulization QHS   atorvastatin  40 mg Oral q1800   calcium acetate  2,001 mg Oral TID with meals   carvedilol  12.5 mg Oral QHS   carvedilol  12.5 mg Oral Once per day on Sun Tue Thu   Chlorhexidine Gluconate Cloth  6 each Topical Q0600   FLUoxetine  20 mg Oral QHS   gabapentin  200 mg Oral QHS   heparin  5,000 Units Subcutaneous Q8H   insulin aspart  0-6 Units Subcutaneous TID WC   loperamide  2 mg Oral Once per day on Mon Wed Fri Sat   losartan  100 mg Oral Once per day on Sun Tue Thu   losartan  100 mg Oral Once per day on Mon Wed Fri Sat   multivitamin  1 tablet Oral QHS   sodium chloride flush  3 mL Intravenous Q12H   umeclidinium bromide  1 puff Inhalation Daily   Elmarie Shiley, MD 10/14/2021, 8:07 AM

## 2021-10-14 NOTE — Progress Notes (Signed)
Mobility Specialist Progress Note    10/14/21 1038  Mobility  Activity Ambulated independently in hallway  Level of Assistance Modified independent, requires aide device or extra time  Assistive Device Four wheel walker  Distance Ambulated (ft) 215 ft  Activity Response Tolerated fair  $Mobility charge 1 Mobility   Pt received in bed and agreeable. Ambulated on 4LO2 with no complaints. Returned to bed with call bell in reach and daughter present.   [facility] Mobility Specialist  M.S. 2C and 6E: [phone] M.S. 4E: [phone]

## 2021-10-14 NOTE — TOC Transition Note (Addendum)
Transition of Care [facility]) - CM/SW Discharge Note   Patient Details  Name: Tina Gomez MRN: [id number] Date of Birth: Nov 23, 1955  Transition of Care [facility]) CM/SW Contact:  Zenon Mayo, RN Phone Number: 10/14/2021, 9:12 AM   Clinical Narrative:    Patient is for dc today, NCM spoke with her at bedside with daughter. She is on home oxygen and has the tank in the car, she states she does not want any HH services. She states she has all the DME she needs.  She states she is active with [facility] for outpatient Palliative Services.    Final next level of care: Home/Self Care Barriers to Discharge: No Barriers Identified   Patient Goals and CMS Choice     Choice offered to / list presented to : NA  Discharge Placement                       Discharge Plan and Services                  DME Agency: NA       HH Arranged: NA          Social Determinants of Health (SDOH) Interventions     Readmission Risk Interventions Readmission Risk Prevention Plan 09/13/2021 04/13/2021 07/28/2020  Transportation Screening Complete Complete Complete  PCP or Specialist Appt within 3-5 Days - - Not Complete  Not Complete comments - - first available PCP is 12/16  [place] or [place] - - Complete  Social Work Consult for [place] Planning/Counseling - - Complete  Palliative Care Screening - - Not Applicable  Medication Review Press photographer) Referral to Pharmacy Complete -  PCP or Specialist appointment within 3-5 days of discharge Complete Complete -  [place] or Home Care Consult Complete Complete -  SW Recovery Care/Counseling Consult Complete Complete -  Palliative Care Screening Complete Not Applicable -  [place] Not Applicable Not Applicable -  Some recent data might be hidden

## 2021-10-14 NOTE — Plan of Care (Signed)
Problem: Pain Managment: ?Goal: General experience of comfort will improve ?Outcome: Completed/Met ?  ?Problem: Elimination: ?Goal: Will not experience complications related to urinary retention ?Outcome: Completed/Met ?  ?Problem: Coping: ?Goal: Level of anxiety will decrease ?Outcome: Completed/Met ?  ?Problem: Nutrition: ?Goal: Adequate nutrition will be maintained ?Outcome: Completed/Met ?  ?Problem: Clinical Measurements: ?Goal: Will remain free from infection ?Outcome: Completed/Met ?  ?

## 2021-10-15 ENCOUNTER — Telehealth: Payer: Self-pay

## 2021-10-15 ENCOUNTER — Telehealth: Payer: Self-pay | Admitting: *Deleted

## 2021-10-15 ENCOUNTER — Other Ambulatory Visit: Payer: Self-pay

## 2021-10-15 NOTE — Telephone Encounter (Signed)
Patient already received TOC call.  This CM called her to inquire about scheduling a follow up appointment with Dr Margarita Rana. She stated that she does not need to schedule an appointment now and will call the clinic when she is ready to schedule an appointment.

## 2021-10-15 NOTE — Telephone Encounter (Signed)
Transition Care Management Follow-up Telephone Call Date of discharge and from where: 10/14/2021 - [facility] How have you been since you were released from the hospital? "I am doing the best I can" Any questions or concerns? No  Items Reviewed: Did the pt receive and understand the discharge instructions provided? Yes  Medications obtained and verified?  N/A Other? No  Any new allergies since your discharge? No  Dietary orders reviewed? No Do you have support at home? Yes    Functional Questionnaire: (I = Independent and D = Dependent) ADLs: I  Bathing/Dressing- I  Meal Prep- I  Eating- I  Maintaining continence- I  Transferring/Ambulation- I  Managing Meds- I  Follow up appointments reviewed:  PCP Hospital f/u appt confirmed? No   - on palliative care [facility] f/u appt confirmed? No   Are transportation arrangements needed? No  If their condition worsens, is the pt aware to call PCP or go to the Emergency Dept.? Yes Was the patient provided with contact information for the PCP's office or ED? Yes Was to pt encouraged to call back with questions or concerns? Yes

## 2021-10-22 ENCOUNTER — Other Ambulatory Visit: Payer: Self-pay

## 2021-10-26 NOTE — Discharge Summary (Signed)
Physician Discharge Summary  Tina Gomez WRU:[id number] DOB: Aug 21, 1956 DOA: 10/12/2021  PCP: Charlott Rakes, MD  Admit date: 10/12/2021 Discharge date: 10/14/2021  Time spent: 35 minutes  Recommendations for Outpatient Follow-up:  PCP in 1 week, please review all imaging studies from this admission for any incidental findings that may need follow-up Palliative care follow-up   Discharge Diagnoses:  Principal Problem:   Acute on chronic respiratory failure with hypoxia ([place]) Active Problems:   Chronic systolic CHF (congestive heart failure) (HCC)   elevated troponin in setting of known CAD/HLD   ESRD on hemodialysis ([place])   COPD (chronic obstructive pulmonary disease) ([place])   Type 2 diabetes mellitus with diabetic neuropathy, unspecified ([place])   Essential hypertension, benign   Anxiety and depression   Fall at home, initial encounter DO NOT RESUSCITATE  Discharge Condition: Stable  Diet recommendation: Renal, diabetic  Filed Weights   10/13/21 1349 10/13/21 1704 10/14/21 0456  Weight: 79 kg 75.5 kg 75.3 kg    History of present illness:   Chronically ill 65/F with history of COPD/chronic respiratory failure on 3-4 L home O2, ESRD on HD MWF S, DM2, CVA, chronic CAD, chronic systolic CHF presented to the ED with shortness of breath after missing dialysis on Wednesday. -Recently saw hospice on Tuesday, not ready to stop HD, remains DNR, goal for conservative management -In the ED was hypoxic requiring nonrebreather mask initially and then 6 to 7 L, chest x-ray noted interstitial edema, troponin 62, hemoglobin 9.5  Hospital Course:   Acute on chronic respiratory failure with hypoxia ([place])- (present on admission) Pulmonary edema from missed HD  Acute on chronic systolic CHF  -Improved, undergoes dialysis 4X per week  -Nephrology consulting, dialyzed yesterday -Oxygenation improved, weaned O2 down to 3-4 L -which is her baseline -Discharged home in a stable condition with  family   Chronic systolic CHF (congestive heart failure) ([place])- (present on admission) -Last echo: 11/2020 EF of 30%. LV moderate to severely decreased fx. Grade II diastolic dysfunction  -Volume managed with HD, see discussion above, continue carvedilol    ESRD on hemodialysis [facility]) -Nephrology consulting -Dialyzed yesterday -Recently met w/ palliative care team   elevated troponin in setting of known CAD/HLD- (present on admission) -Secondary to demand ischemia in setting of pulmonary edema, no symptoms of ACS, flat trend Cardiac cath 11/2020: 70% proximal right coronary artery  followed by 50% with otherwise non obstructive disease.  -Continue Coreg, statin   COPD (chronic obstructive pulmonary disease) ([place]) -Stable, chronic respiratory failure on 3-4 L home O2 Continue home albuterol and spiriva.    Type 2 diabetes mellitus with diabetic neuropathy, unspecified ([place])- (present on admission) a1c of 6.4 in 08/2021 -CBGs are stable, continue SSI   Fall at home, initial encounter - PT eval completed, ambulating without issues   Anxiety and depression- (present on admission) continue prozac   Essential hypertension, benign- (present on admission) Continue home medication of coreg, cozaar     Code Status: DNR  Consultations: Nephrology  Discharge Exam: Vitals:   10/14/21 0718 10/14/21 1119  BP: (!) 161/54 (!) 162/60  Pulse: (!) 56 (!) 58  Resp: 18 18  Temp: 97.8 F (36.6 C) 97.9 F (36.6 C)  SpO2: 99% 97%   General exam: Pleasant chronically ill female sitting up in bed, AAO x3 HEENT: Positive JVD CVS: S1-S2, regular rhythm Lungs: Few basilar rales Abdomen: Soft, obese, nontender, bowel sounds present Extremities: No edema  Psychiatry:  Mood & affect appropriate.    Discharge  Instructions   Discharge Instructions     Diet - low sodium heart healthy   Complete by: As directed    Increase activity slowly   Complete by: As directed    No dressing needed    Complete by: As directed       Allergies as of 10/14/2021       Reactions   Codeine Nausea And Vomiting   Adhesive [tape] Other (See Comments)   Tape breaks out the skin if it is left on for a lengthy period of time        Medication List     TAKE these medications    acetaminophen 500 MG tablet Commonly known as: TYLENOL Take 1,000 mg by mouth every 6 (six) hours as needed for mild pain or headache.   albuterol 108 (90 Base) MCG/ACT inhaler Commonly known as: Ventolin HFA Inhale 2 puffs into the lungs every 6 (six) hours as needed for wheezing or shortness of breath. What changed: Another medication with the same name was changed. Make sure you understand how and when to take each.   albuterol (2.5 MG/3ML) 0.083% nebulizer solution Commonly known as: PROVENTIL Take 3 mLs (2.5 mg total) by nebulization every 6 (six) hours as needed for wheezing or shortness of breath. What changed: when to take this   atorvastatin 40 MG tablet Commonly known as: LIPITOR Take 1 tablet (40 mg total) by mouth daily at 6 PM.   BAYER BACK & BODY PO Take 1 tablet by mouth daily as needed (headache/pain).   blood glucose meter kit and supplies Use as instructed   True Metrix Meter Devi 1 each by Does not apply route 3 (three) times daily before meals.   calcium acetate 667 MG capsule Commonly known as: PHOSLO Take 2-3 capsules (1,334-2,001 mg total) by mouth 3 (three) times daily with meals. Take 3 capsules (2001 mg) by mouth three times daily before meals and 2 capsule (1334 mg) twice daily before snack What changed:  how much to take when to take this additional instructions   carvedilol 12.5 MG tablet Commonly known as: COREG Take 1 tablet (12.5 mg total) by mouth 2 (two) times daily with a meal. What changed:  when to take this additional instructions   DIALYVITE 800 WITH ZINC 0.8 MG Tabs Take 1 tablet by mouth at bedtime.   FLUoxetine 20 MG capsule Commonly known as:  PROzac Take 1 capsule (20 mg total) by mouth daily. What changed: when to take this   freestyle lancets Use as instructed   TRUEplus Lancets 28G Misc 1 each by Does not apply route 3 (three) times daily before meals.   gabapentin 100 MG capsule Commonly known as: NEURONTIN Take 1 capsule (100 mg total) by mouth 2 (two) times daily. What changed:  how much to take when to take this   lidocaine-prilocaine cream Commonly known as: EMLA Apply 1 application topically See admin instructions. Apply topically to port access one hour prior to dialysis - Monday, Wednesday, Friday, Saturday   loperamide 2 MG tablet Commonly known as: IMODIUM A-D Take 1 tablet (2 mg total) by mouth 4 (four) times daily as needed for diarrhea or loose stools. What changed:  when to take this additional instructions   losartan 100 MG tablet Commonly known as: COZAAR Take 1 tablet (100 mg total) by mouth every evening. What changed:  when to take this additional instructions   Misc. Devices Misc Large depends. Diagnosis - fecal incontinence   Misc.  Devices Misc Rolling walker with seat   Misc. Devices Misc Wheelchair with Accessories: elevating leg rests (ELRs), wheel locks, extensions and anti-tippers. Cane or walker will not suffice   Misc. Devices Misc Portable oxygen concentrator. Chronic respiratory failure - 4L oxygen qhs and prn   ondansetron 4 MG tablet Commonly known as: Zofran Take 1 tablet (4 mg total) by mouth every 8 (eight) hours as needed for nausea or vomiting.   OXYGEN Inhale 4 L/min into the lungs continuous.   Spiriva Respimat 2.5 MCG/ACT Aers Generic drug: Tiotropium Bromide Monohydrate Inhale 2 puffs into the lungs daily.   TRUEtest Test test strip Generic drug: glucose blood Use as instructed               Discharge Care Instructions  (From admission, onward)           Start     Ordered   10/14/21 0000  No dressing needed        10/14/21 0850            Allergies  Allergen Reactions   Codeine Nausea And Vomiting   Adhesive [Tape] Other (See Comments)    Tape breaks out the skin if it is left on for a lengthy period of time    Follow-up Information     Charlott Rakes, MD. Schedule an appointment as soon as possible for a visit in 1 week(s).   Specialty: Family Medicine Contact information: [place] [address] [phone]                  The results of significant diagnostics from this hospitalization (including imaging, microbiology, ancillary and laboratory) are listed below for reference.    Significant Diagnostic Studies: CT HEAD WO CONTRAST (5MM)  Result Date: 10/12/2021 CLINICAL DATA:  History: Head trauma, moderate/severe.  Provided EXAM: CT HEAD WITHOUT CONTRAST TECHNIQUE: Contiguous axial images were obtained from the base of the skull through the vertex without intravenous contrast. RADIATION DOSE REDUCTION: This exam was performed according to the departmental dose-optimization program which includes automated exposure control, adjustment of the mA and/or kV according to patient size and/or use of iterative reconstruction technique. COMPARISON:  Brain MRI 08/03/2021.  Head CT 08/03/2021. FINDINGS: Brain: Mild generalized cerebral atrophy. Mild patchy and ill-defined hypoattenuation within the cerebral white matter, nonspecific but compatible with chronic small vessel ischemic disease. There is no acute intracranial hemorrhage. No demarcated cortical infarct. No extra-axial fluid collection. No evidence of an intracranial mass. No midline shift. Vascular: No hyperdense vessel.  Atherosclerotic calcifications. Skull: Normal. Negative for fracture or focal lesion. Sinuses/Orbits: Visualized orbits show no acute finding. Mild mucosal thickening and/or fluid within the bilateral ethmoid sinuses. IMPRESSION: No evidence of acute intracranial abnormality. Mild chronic small vessel ischemic changes  within the cerebral white matter. Mild generalized cerebral atrophy. Electronically Signed   By: Kellie Simmering D.O.   On: 10/12/2021 14:34   DG Chest Port 1 View  Result Date: 10/12/2021 CLINICAL DATA:  Shortness of breath EXAM: PORTABLE CHEST 1 VIEW COMPARISON:  09/12/2021 FINDINGS: Cardiac shadow is enlarged. Aortic calcifications are noted. Bilateral airspace opacities have improved somewhat in the interval from the prior exam. Interstitial changes are seen consistent with edema. No sizable effusion is noted. IMPRESSION: Interstitial edema bilaterally. Previously seen opacities have improved somewhat in the interval from the prior study. Electronically Signed   By: Inez Catalina M.D.   On: 10/12/2021 02:34    Microbiology: No results found for this or  any previous visit (from the past 240 hour(s)).   Labs: Basic Metabolic Panel: No results for input(s): NA, K, CL, CO2, GLUCOSE, BUN, CREATININE, CALCIUM, MG, PHOS in the last 168 hours. Liver Function Tests: No results for input(s): AST, ALT, ALKPHOS, BILITOT, PROT, ALBUMIN in the last 168 hours. No results for input(s): LIPASE, AMYLASE in the last 168 hours. No results for input(s): AMMONIA in the last 168 hours. CBC: No results for input(s): WBC, NEUTROABS, HGB, HCT, MCV, PLT in the last 168 hours. Cardiac Enzymes: No results for input(s): CKTOTAL, CKMB, CKMBINDEX, TROPONINI in the last 168 hours. BNP: BNP (last 3 results) Recent Labs    08/03/21 2248 09/12/21 0504 10/12/21 1433  BNP 1,684.3* 1,909.9* 3,975.9*    ProBNP (last 3 results) No results for input(s): PROBNP in the last 8760 hours.  CBG: No results for input(s): GLUCAP in the last 168 hours.     Signed:  Domenic Polite MD.  [facility] 10/26/2021, 2:45 PM

## 2021-11-21 ENCOUNTER — Encounter: Payer: Self-pay | Admitting: Physician Assistant

## 2021-11-21 ENCOUNTER — Emergency Department (HOSPITAL_BASED_OUTPATIENT_CLINIC_OR_DEPARTMENT_OTHER): Admit: 2021-11-21 | Discharge: 2021-11-21 | Disposition: A | Discharge status: Home / Self Care | Payer: Medicare Other

## 2021-11-21 ENCOUNTER — Other Ambulatory Visit: Payer: Self-pay

## 2021-11-21 ENCOUNTER — Encounter (HOSPITAL_COMMUNITY): Payer: Self-pay

## 2021-11-21 ENCOUNTER — Inpatient Hospital Stay (HOSPITAL_COMMUNITY)
Admission: EM | Admit: 2021-11-21 | Discharge: 2021-12-03 | DRG: 270 | Disposition: A | Discharge status: Hospice | Payer: Medicare Other | Source: Ambulatory Visit | Attending: Student | Admitting: Student

## 2021-11-21 ENCOUNTER — Emergency Department (HOSPITAL_COMMUNITY): Payer: Medicare Other

## 2021-11-21 ENCOUNTER — Encounter: Payer: Self-pay | Admitting: Family Medicine

## 2021-11-21 ENCOUNTER — Ambulatory Visit: Payer: Self-pay

## 2021-11-21 ENCOUNTER — Ambulatory Visit (INDEPENDENT_AMBULATORY_CARE_PROVIDER_SITE_OTHER): Payer: Medicare Other | Admitting: Physician Assistant

## 2021-11-21 VITALS — BP 174/73 | HR 64 | Temp 98.2°F | Resp 18

## 2021-11-21 DIAGNOSIS — F32A Depression, unspecified: Secondary | ICD-10-CM | POA: Diagnosis present

## 2021-11-21 DIAGNOSIS — Z515 Encounter for palliative care: Secondary | ICD-10-CM | POA: Diagnosis not present

## 2021-11-21 DIAGNOSIS — I998 Other disorder of circulatory system: Principal | ICD-10-CM

## 2021-11-21 DIAGNOSIS — E1140 Type 2 diabetes mellitus with diabetic neuropathy, unspecified: Secondary | ICD-10-CM | POA: Diagnosis present

## 2021-11-21 DIAGNOSIS — D62 Acute posthemorrhagic anemia: Secondary | ICD-10-CM | POA: Diagnosis not present

## 2021-11-21 DIAGNOSIS — I70223 Atherosclerosis of native arteries of extremities with rest pain, bilateral legs: Secondary | ICD-10-CM

## 2021-11-21 DIAGNOSIS — J9611 Chronic respiratory failure with hypoxia: Secondary | ICD-10-CM | POA: Diagnosis not present

## 2021-11-21 DIAGNOSIS — Z66 Do not resuscitate: Secondary | ICD-10-CM | POA: Diagnosis present

## 2021-11-21 DIAGNOSIS — N186 End stage renal disease: Secondary | ICD-10-CM | POA: Diagnosis not present

## 2021-11-21 DIAGNOSIS — Z7982 Long term (current) use of aspirin: Secondary | ICD-10-CM

## 2021-11-21 DIAGNOSIS — F17210 Nicotine dependence, cigarettes, uncomplicated: Secondary | ICD-10-CM | POA: Diagnosis present

## 2021-11-21 DIAGNOSIS — I132 Hypertensive heart and chronic kidney disease with heart failure and with stage 5 chronic kidney disease, or end stage renal disease: Secondary | ICD-10-CM | POA: Diagnosis present

## 2021-11-21 DIAGNOSIS — E785 Hyperlipidemia, unspecified: Secondary | ICD-10-CM | POA: Diagnosis present

## 2021-11-21 DIAGNOSIS — I2510 Atherosclerotic heart disease of native coronary artery without angina pectoris: Secondary | ICD-10-CM | POA: Diagnosis present

## 2021-11-21 DIAGNOSIS — E1122 Type 2 diabetes mellitus with diabetic chronic kidney disease: Secondary | ICD-10-CM | POA: Diagnosis present

## 2021-11-21 DIAGNOSIS — J449 Chronic obstructive pulmonary disease, unspecified: Secondary | ICD-10-CM | POA: Diagnosis present

## 2021-11-21 DIAGNOSIS — Z79899 Other long term (current) drug therapy: Secondary | ICD-10-CM

## 2021-11-21 DIAGNOSIS — Z9981 Dependence on supplemental oxygen: Secondary | ICD-10-CM

## 2021-11-21 DIAGNOSIS — Z7189 Other specified counseling: Secondary | ICD-10-CM

## 2021-11-21 DIAGNOSIS — Z20822 Contact with and (suspected) exposure to covid-19: Secondary | ICD-10-CM | POA: Diagnosis present

## 2021-11-21 DIAGNOSIS — D631 Anemia in chronic kidney disease: Secondary | ICD-10-CM | POA: Diagnosis present

## 2021-11-21 DIAGNOSIS — J410 Simple chronic bronchitis: Secondary | ICD-10-CM | POA: Diagnosis not present

## 2021-11-21 DIAGNOSIS — Z6832 Body mass index (BMI) 32.0-32.9, adult: Secondary | ICD-10-CM | POA: Diagnosis not present

## 2021-11-21 DIAGNOSIS — I6990 Unspecified sequelae of unspecified cerebrovascular disease: Secondary | ICD-10-CM | POA: Diagnosis not present

## 2021-11-21 DIAGNOSIS — Z992 Dependence on renal dialysis: Secondary | ICD-10-CM | POA: Diagnosis not present

## 2021-11-21 DIAGNOSIS — Z885 Allergy status to narcotic agent status: Secondary | ICD-10-CM

## 2021-11-21 DIAGNOSIS — Z01810 Encounter for preprocedural cardiovascular examination: Secondary | ICD-10-CM | POA: Diagnosis not present

## 2021-11-21 DIAGNOSIS — Z823 Family history of stroke: Secondary | ICD-10-CM

## 2021-11-21 DIAGNOSIS — E440 Moderate protein-calorie malnutrition: Secondary | ICD-10-CM | POA: Diagnosis present

## 2021-11-21 DIAGNOSIS — F419 Anxiety disorder, unspecified: Secondary | ICD-10-CM | POA: Diagnosis present

## 2021-11-21 DIAGNOSIS — I5030 Unspecified diastolic (congestive) heart failure: Secondary | ICD-10-CM | POA: Diagnosis not present

## 2021-11-21 DIAGNOSIS — E11621 Type 2 diabetes mellitus with foot ulcer: Secondary | ICD-10-CM | POA: Diagnosis present

## 2021-11-21 DIAGNOSIS — E1143 Type 2 diabetes mellitus with diabetic autonomic (poly)neuropathy: Secondary | ICD-10-CM | POA: Diagnosis present

## 2021-11-21 DIAGNOSIS — I739 Peripheral vascular disease, unspecified: Secondary | ICD-10-CM | POA: Diagnosis not present

## 2021-11-21 DIAGNOSIS — L97519 Non-pressure chronic ulcer of other part of right foot with unspecified severity: Secondary | ICD-10-CM | POA: Diagnosis present

## 2021-11-21 DIAGNOSIS — M898X9 Other specified disorders of bone, unspecified site: Secondary | ICD-10-CM | POA: Diagnosis present

## 2021-11-21 DIAGNOSIS — I9581 Postprocedural hypotension: Secondary | ICD-10-CM | POA: Diagnosis not present

## 2021-11-21 DIAGNOSIS — I5022 Chronic systolic (congestive) heart failure: Secondary | ICD-10-CM | POA: Diagnosis not present

## 2021-11-21 DIAGNOSIS — I70221 Atherosclerosis of native arteries of extremities with rest pain, right leg: Secondary | ICD-10-CM | POA: Diagnosis not present

## 2021-11-21 DIAGNOSIS — Z83438 Family history of other disorder of lipoprotein metabolism and other lipidemia: Secondary | ICD-10-CM

## 2021-11-21 DIAGNOSIS — E1151 Type 2 diabetes mellitus with diabetic peripheral angiopathy without gangrene: Principal | ICD-10-CM | POA: Diagnosis present

## 2021-11-21 DIAGNOSIS — Z8249 Family history of ischemic heart disease and other diseases of the circulatory system: Secondary | ICD-10-CM

## 2021-11-21 DIAGNOSIS — I10 Essential (primary) hypertension: Secondary | ICD-10-CM | POA: Diagnosis present

## 2021-11-21 DIAGNOSIS — I429 Cardiomyopathy, unspecified: Secondary | ICD-10-CM | POA: Diagnosis not present

## 2021-11-21 DIAGNOSIS — Z95828 Presence of other vascular implants and grafts: Secondary | ICD-10-CM | POA: Diagnosis not present

## 2021-11-21 DIAGNOSIS — Z8673 Personal history of transient ischemic attack (TIA), and cerebral infarction without residual deficits: Secondary | ICD-10-CM

## 2021-11-21 HISTORY — DX: Chronic respiratory failure with hypoxia: J96.11

## 2021-11-21 HISTORY — DX: End stage renal disease: N18.6

## 2021-11-21 HISTORY — DX: Peripheral vascular disease, unspecified: I73.9

## 2021-11-21 HISTORY — DX: End stage renal disease: Z99.2

## 2021-11-21 HISTORY — DX: Atherosclerotic heart disease of native coronary artery without angina pectoris: I25.10

## 2021-11-21 LAB — APTT: aPTT: 39 seconds — ABNORMAL HIGH (ref 24–36)

## 2021-11-21 LAB — COMPREHENSIVE METABOLIC PANEL
ALT: 10 U/L (ref 0–44)
AST: 18 U/L (ref 15–41)
Albumin: 3.1 g/dL — ABNORMAL LOW (ref 3.5–5.0)
Alkaline Phosphatase: 73 U/L (ref 38–126)
Anion gap: 14 (ref 5–15)
BUN: 29 mg/dL — ABNORMAL HIGH (ref 8–23)
CO2: 27 mmol/L (ref 22–32)
Calcium: 9 mg/dL (ref 8.9–10.3)
Chloride: 95 mmol/L — ABNORMAL LOW (ref 98–111)
Creatinine, Ser: 4.14 mg/dL — ABNORMAL HIGH (ref 0.44–1.00)
GFR, Estimated: 11 mL/min — ABNORMAL LOW (ref >60)
Glucose, Bld: 97 mg/dL (ref 70–99)
Potassium: 3.4 mmol/L — ABNORMAL LOW (ref 3.5–5.1)
Sodium: 136 mmol/L (ref 135–145)
Total Bilirubin: 0.7 mg/dL (ref 0.3–1.2)
Total Protein: 7.1 g/dL (ref 6.5–8.1)

## 2021-11-21 LAB — CBC WITH DIFFERENTIAL/PLATELET
Abs Immature Granulocytes: 0.04 10*3/uL (ref 0.00–0.07)
Basophils Absolute: 0 10*3/uL (ref 0.0–0.1)
Basophils Relative: 0 %
Eosinophils Absolute: 0.1 10*3/uL (ref 0.0–0.5)
Eosinophils Relative: 2 %
HCT: 40.5 % (ref 36.0–46.0)
Hemoglobin: 12.4 g/dL (ref 12.0–15.0)
Immature Granulocytes: 1 %
Lymphocytes Relative: 23 %
Lymphs Abs: 1.5 10*3/uL (ref 0.7–4.0)
MCH: 27.7 pg (ref 26.0–34.0)
MCHC: 30.6 g/dL (ref 30.0–36.0)
MCV: 90.4 fL (ref 80.0–100.0)
Monocytes Absolute: 0.6 10*3/uL (ref 0.1–1.0)
Monocytes Relative: 10 %
Neutro Abs: 4.2 10*3/uL (ref 1.7–7.7)
Neutrophils Relative %: 64 %
Platelets: 232 10*3/uL (ref 150–400)
RBC: 4.48 MIL/uL (ref 3.87–5.11)
RDW: 17.1 % — ABNORMAL HIGH (ref 11.5–15.5)
WBC: 6.5 10*3/uL (ref 4.0–10.5)
nRBC: 0 % (ref 0.0–0.2)

## 2021-11-21 LAB — PROTIME-INR
INR: 1.2 (ref 0.8–1.2)
Prothrombin Time: 14.7 seconds (ref 11.4–15.2)

## 2021-11-21 LAB — RESP PANEL BY RT-PCR (FLU A&B, COVID) ARPGX2
Influenza A by PCR: NEGATIVE
Influenza B by PCR: NEGATIVE
SARS Coronavirus 2 by RT PCR: NEGATIVE

## 2021-11-21 LAB — HEPARIN LEVEL (UNFRACTIONATED): Heparin Unfractionated: <0.1 IU/mL — ABNORMAL LOW (ref 0.30–0.70)

## 2021-11-21 MED ORDER — ONDANSETRON HCL 4 MG PO TABS: 4.0000 mg | ORAL_TABLET | Freq: Four times a day (QID) | ORAL | Status: DC | PRN

## 2021-11-21 MED ORDER — LOSARTAN POTASSIUM 50 MG PO TABS
100.0000 mg | ORAL_TABLET | Freq: Every day | ORAL | Status: DC
  Administered 2021-11-22 – 2021-11-27 (×6): 100 mg via ORAL
  Filled 2021-11-21 (×7): qty 2

## 2021-11-21 MED ORDER — ASPIRIN 325 MG PO TABS
325.0000 mg | ORAL_TABLET | Freq: Every day | ORAL | Status: DC
  Administered 2021-11-22 – 2021-11-23 (×2): 325 mg via ORAL
  Filled 2021-11-21 (×2): qty 1

## 2021-11-21 MED ORDER — HYDROXYZINE HCL 25 MG PO TABS
25.0000 mg | ORAL_TABLET | Freq: Three times a day (TID) | ORAL | Status: DC | PRN
  Administered 2021-11-22 – 2021-12-01 (×5): 25 mg via ORAL
  Filled 2021-11-21 (×5): qty 1

## 2021-11-21 MED ORDER — SODIUM CHLORIDE 0.9% FLUSH
3.0000 mL | Freq: Two times a day (BID) | INTRAVENOUS | Status: DC
  Administered 2021-11-22 – 2021-12-01 (×10): 3 mL via INTRAVENOUS

## 2021-11-21 MED ORDER — INSULIN ASPART 100 UNIT/ML IJ SOLN
0.0000 [IU] | Freq: Three times a day (TID) | INTRAMUSCULAR | Status: DC
  Administered 2021-11-25 – 2021-11-26 (×2): 1 [IU] via SUBCUTANEOUS

## 2021-11-21 MED ORDER — UMECLIDINIUM BROMIDE 62.5 MCG/ACT IN AEPB
1.0000 | INHALATION_SPRAY | Freq: Every day | RESPIRATORY_TRACT | Status: DC
  Filled 2021-11-21: qty 7

## 2021-11-21 MED ORDER — CARVEDILOL 12.5 MG PO TABS
12.5000 mg | ORAL_TABLET | ORAL | Status: DC
  Administered 2021-11-22 – 2021-11-27 (×3): 12.5 mg via ORAL
  Filled 2021-11-21 (×3): qty 1

## 2021-11-21 MED ORDER — FLUOXETINE HCL 20 MG PO CAPS
20.0000 mg | ORAL_CAPSULE | Freq: Every day | ORAL | Status: DC
Start: 2021-11-21 — End: 2021-12-04
  Administered 2021-11-22 – 2021-12-02 (×12): 20 mg via ORAL
  Filled 2021-11-21 (×13): qty 1

## 2021-11-21 MED ORDER — ONDANSETRON HCL 4 MG/2ML IJ SOLN
4.0000 mg | Freq: Four times a day (QID) | INTRAMUSCULAR | Status: DC | PRN
  Administered 2021-11-22 – 2021-11-23 (×2): 4 mg via INTRAVENOUS
  Filled 2021-11-21 (×6): qty 2

## 2021-11-21 MED ORDER — ALBUTEROL SULFATE (2.5 MG/3ML) 0.083% IN NEBU: 2.5000 mg | INHALATION_SOLUTION | Freq: Four times a day (QID) | RESPIRATORY_TRACT | Status: DC | PRN

## 2021-11-21 MED ORDER — GABAPENTIN 100 MG PO CAPS
200.0000 mg | ORAL_CAPSULE | Freq: Every day | ORAL | Status: DC
  Administered 2021-11-22 – 2021-12-02 (×11): 200 mg via ORAL
  Filled 2021-11-21 (×13): qty 2

## 2021-11-21 MED ORDER — HEPARIN BOLUS VIA INFUSION
3000.0000 [IU] | Freq: Once | INTRAVENOUS | Status: AC
  Administered 2021-11-21: 3000 [IU] via INTRAVENOUS
  Filled 2021-11-21: qty 3000

## 2021-11-21 MED ORDER — SENNOSIDES-DOCUSATE SODIUM 8.6-50 MG PO TABS: 1.0000 | ORAL_TABLET | Freq: Every evening | ORAL | Status: DC | PRN

## 2021-11-21 MED ORDER — LOSARTAN POTASSIUM 50 MG PO TABS
100.0000 mg | ORAL_TABLET | ORAL | Status: DC
Start: 2021-11-21 — End: 2021-11-21

## 2021-11-21 MED ORDER — CARVEDILOL 12.5 MG PO TABS: 12.5000 mg | ORAL_TABLET | ORAL | Status: DC

## 2021-11-21 MED ORDER — IOHEXOL 350 MG/ML SOLN
100.0000 mL | Freq: Once | INTRAVENOUS | Status: AC | PRN
  Administered 2021-11-21: 100 mL via INTRAVENOUS

## 2021-11-21 MED ORDER — ACETAMINOPHEN 650 MG RE SUPP: 650.0000 mg | Freq: Four times a day (QID) | RECTAL | Status: DC | PRN

## 2021-11-21 MED ORDER — HEPARIN (PORCINE) 25000 UT/250ML-% IV SOLN
1750.0000 [IU]/h | INTRAVENOUS | Status: DC
  Administered 2021-11-21: 1050 [IU]/h via INTRAVENOUS
  Administered 2021-11-22: 1300 [IU]/h via INTRAVENOUS
  Administered 2021-11-23: 1600 [IU]/h via INTRAVENOUS
  Filled 2021-11-21 (×3): qty 250

## 2021-11-21 MED ORDER — ATORVASTATIN CALCIUM 40 MG PO TABS
40.0000 mg | ORAL_TABLET | Freq: Every day | ORAL | Status: DC
  Administered 2021-11-22 – 2021-11-24 (×3): 40 mg via ORAL
  Filled 2021-11-21 (×3): qty 1

## 2021-11-21 MED ORDER — ACETAMINOPHEN 325 MG PO TABS
650.0000 mg | ORAL_TABLET | Freq: Four times a day (QID) | ORAL | Status: DC | PRN
  Administered 2021-11-24: 650 mg via ORAL
  Filled 2021-11-21: qty 2

## 2021-11-21 MED ORDER — CARVEDILOL 12.5 MG PO TABS
12.5000 mg | ORAL_TABLET | Freq: Every day | ORAL | Status: DC
  Administered 2021-11-22 – 2021-11-30 (×9): 12.5 mg via ORAL
  Filled 2021-11-21 (×11): qty 1

## 2021-11-21 NOTE — ED Triage Notes (Signed)
Pt BIB [facility]. Pt has been having intermittent foot pain for 3-4 weeks with reddness to bilateral feet. Today Pt having cyanosis to the toes. EMS reported they were unable to palpate pedal pulses.  ?

## 2021-11-21 NOTE — Assessment & Plan Note (Addendum)
S/p endarterectomy and right femoropopliteal bypass on 3/22.  ?

## 2021-11-21 NOTE — H&P (Signed)
?History and Physical  ? ? ?Tina Gomez DOB: 11-06-1955 DOA: 11/21/2021 ? ?PCP: Charlott Rakes, MD  ?Patient coming from: PCP office ? ?I have personally briefly reviewed patient's old medical records in [place] ? ?Chief Complaint: Lower limb ischemia ? ?HPI: ?Tina Gomez is a 66 y.o. female with medical history significant for ESRD on MWFSa HD, COPD, chronic respiratory failure with hypoxia on 3-4 L supplemental O2 via [place], history of CVA, CAD, HFrEF (EF 30% 11/08/2020), diet-controlled T2DM, HTN, HLD, depression/anxiety, tobacco use who presented to the ED for evaluation of lower limb ischemia. ? ?Patient reports about 2 weeks of pain in both of her feet which occurs when she stands but not when she is laying down at rest.  Pain significantly worsened over the last 2 days.  She had noticed discoloration and cold sensation to both of her feet.  She saw her PCP after her usual dialysis session today for further evaluation.  There was concern for lower extremity limb ischemia and patient was sent to the ED for further evaluation. ? ?Patient otherwise denies any chest pain, dyspnea, nausea, vomiting, abdominal pain. ? ?ED Course  Labs/Imaging on admission: I have personally reviewed following labs and imaging studies. ? ?Initial vitals showed BP 173/60, pulse 63, RR 20, temp 99.0 ?F, SPO2 99% on 4 L O2 via . ? ?Labs show sodium 136, potassium 3.4, bicarb 27, BUN 29, creatinine 4.4, serum glucose 97, WBC 6.5, hemoglobin 12.4, platelets 232,000, INR 1.2. ? ?ABIs showed moderate RLE arterial disease, critical left limb ischemia. ? ?CTA abdominal aorta with iliofemoral runoff was severely limited due to heavily calcified atheromatous plaque.  Multifocal segmental occlusions of the bilateral superficial femoral arteries and popliteal arteries and extensive small vessel disease throughout the runoff vessels bilaterally seen.  Changes suggestive of prior diverticulitis with focal contained  perforation at the mid sigmoid colon noted without acute inflammatory changes. ? ?Patient was seen by vascular surgery, Dr. Virl Cagey, who recommended starting on heparin drip and will follow.  The hospitalist service was consulted to admit for further evaluation and management. ? ?Review of Systems: All systems reviewed and are negative except as documented in history of present illness above. ? ? ?Past Medical History:  ?Diagnosis Date  ? Anemia   ? Chronic kidney disease   ? Chronic respiratory failure with hypoxia (HCC)   ? COPD (chronic obstructive pulmonary disease) ([place]) 05/2019  ? no inhaler  ? Diabetes mellitus without complication ([place])   ? no meds - diet controlled  ? ESRD on hemodialysis ([place])   ? History of blood transfusion 09/2020  ? 1 unit  ? HLD (hyperlipidemia)   ? Hypertension   ? PONV (postoperative nausea and vomiting)   ? Restless legs syndrome (RLS)   ? Stroke [facility])   ? ? ?Past Surgical History:  ?Procedure Laterality Date  ? AV FISTULA PLACEMENT Left 07/27/2020  ? Procedure: LEFT BRACHIO-BASILIC ARTERIOVENOUS (AV) FISTULA CREATION;  Surgeon: Cherre Robins, MD;  Location: [facility];  Service: Vascular;  Laterality: Left;  ? BASCILIC VEIN TRANSPOSITION Left 11/02/2020  ? Procedure: LEFT UPPER ARM SECOND STAGE BASILIC VEIN TRANSPOSITION;  Surgeon: Cherre Robins, MD;  Location: [facility] OR;  Service: Vascular;  Laterality: Left;  PERIPHERAL NERVE BLOCK  ? CESAREAN SECTION    ? x 1  ? ESOPHAGOGASTRODUODENOSCOPY (EGD) WITH PROPOFOL N/A 05/26/2020  ? Procedure: ESOPHAGOGASTRODUODENOSCOPY (EGD) WITH PROPOFOL;  Surgeon: Irene Shipper, MD;  Location: [facility] ENDOSCOPY;  Service: Endoscopy;  Laterality: N/A;  ? INSERTION OF DIALYSIS CATHETER Right 07/27/2020  ? Procedure: INSERTION OF RIGHT INTERNAL JUGULAR TUNNELED DIALYSIS CATHETER;  Surgeon: Cherre Robins, MD;  Location: [facility];  Service: Vascular;  Laterality: Right;  ? IR FLUORO GUIDE CV LINE RIGHT  07/25/2020  ? IR US GUIDE VASC ACCESS RIGHT  07/25/2020  ?  LEFT HEART CATH AND CORONARY ANGIOGRAPHY N/A 11/28/2020  ? Procedure: LEFT HEART CATH AND CORONARY ANGIOGRAPHY;  Surgeon: Troy Sine, MD;  Location: [facility] CV LAB;  Service: Cardiovascular;  Laterality: N/A;  ? TONSILLECTOMY    ? UPPER GI ENDOSCOPY    ? growth removed from voice box  ? WRIST SURGERY Left   ? ganglion cyst removal  ? ? ?Social History: ? reports that she has been smoking cigarettes. She has a 47.00 pack-year smoking history. She has never used smokeless tobacco. She reports that she does not drink alcohol and does not use drugs. ? ?Allergies  ?Allergen Reactions  ? Codeine Nausea And Vomiting  ? Adhesive [Tape] Other (See Comments)  ?  Tape breaks out the skin if it is left on for a lengthy period of time  ? ? ?Family History  ?Problem Relation Age of Onset  ? Stroke Mother   ? Hypertension Mother   ? Hyperlipidemia Mother   ? Hypertension Father   ? Hyperlipidemia Father   ? ? ? ?Prior to Admission medications   ?Medication Sig Start Date End Date Taking? Authorizing Provider  ?acetaminophen (TYLENOL) 500 MG tablet Take 1,000 mg by mouth every 6 (six) hours as needed for mild pain or headache.   Yes [provider]  ?albuterol (PROVENTIL) (2.5 MG/3ML) 0.083% nebulizer solution Take 3 mLs (2.5 mg total) by nebulization every 6 (six) hours as needed for wheezing or shortness of breath. ?Patient taking differently: Take 2.5 mg by nebulization at bedtime. 07/12/21  Yes Charlott Rakes, MD  ?albuterol (VENTOLIN HFA) 108 (90 Base) MCG/ACT inhaler Inhale 2 puffs into the lungs every 6 (six) hours as needed for wheezing or shortness of breath. 07/11/21  Yes Charlott Rakes, MD  ?aspirin 325 MG tablet Take 325 mg by mouth daily.   Yes [provider]  ?Aspirin-Caffeine (BAYER BACK & BODY PO) Take 1 tablet by mouth daily as needed (headache/pain).   Yes [provider]  ?atorvastatin (LIPITOR) 40 MG tablet Take 1 tablet (40 mg total) by mouth daily at 6 PM. 04/15/21  Yes Jennye Boroughs, MD  ?B Complex-C-Zn-Folic Acid (DIALYVITE 426 WITH ZINC) 0.8 MG TABS Take 1 tablet by mouth at bedtime. 08/12/20  Yes [provider]  ?carvedilol (COREG) 12.5 MG tablet Take 1 tablet (12.5 mg total) by mouth 2 (two) times daily with a meal. ?Patient taking differently: Take 12.5 mg by mouth See admin instructions. Take one tablet (12.5 mg) by mouth on Sunday, Tuesday, Thursday mornings (non-dialysis days); take one tablet (12.5 mg) daily at bedtime 04/15/21 04/15/22 Yes Jennye Boroughs, MD  ?FLUoxetine (PROZAC) 20 MG capsule Take 1 capsule (20 mg total) by mouth daily. ?Patient taking differently: Take 20 mg by mouth at bedtime. 07/11/21  Yes Charlott Rakes, MD  ?gabapentin (NEURONTIN) 100 MG capsule Take 1 capsule (100 mg total) by mouth 2 (two) times daily. ?Patient taking differently: Take 200 mg by mouth at bedtime. 10/01/21  Yes Charlott Rakes, MD  ?hydrOXYzine (ATARAX) 25 MG tablet Take 25 mg by mouth 3 (three) times daily as needed for anxiety. 10/15/21  Yes [provider]  ?  lidocaine-prilocaine (EMLA) cream Apply 1 application topically See admin instructions. Apply topically to port access one hour prior to dialysis - Monday, Wednesday, Friday, Saturday 12/04/20  Yes [provider]  ?loperamide (IMODIUM A-D) 2 MG tablet Take 1 tablet (2 mg total) by mouth 4 (four) times daily as needed for diarrhea or loose stools. ?Patient taking differently: Take 2 mg by mouth See admin instructions. Take one tablet (2 mg) by mouth prior to dialysis on Monday, Wednesday, Friday and Saturday 09/26/21  Yes [place]lin, Charlane Ferretti, MD  ?losartan (COZAAR) 100 MG tablet Take 1 tablet (100 mg total) by mouth every evening. ?Patient taking differently: Take 100 mg by mouth See admin instructions. Take one tablet (100 mg) by mouth at noon on Sunday, Tuesday, Thursday (non-dialysis days); take one tablet (100 mg) at bedtime on Monday, Wednesday, Friday, Saturday (dialysis days) 05/01/21  Yes Charlott Rakes, MD   ?ondansetron (ZOFRAN) 4 MG tablet Take 1 tablet (4 mg total) by mouth every 8 (eight) hours as needed for nausea or vomiting. 06/19/21  Yes Charlott Rakes, MD  ?Tiotropium Bromide Monohydrate (SPIRIVA RESP

## 2021-11-21 NOTE — Assessment & Plan Note (Addendum)
LDL is 102.  Lipitor discontinued. ? ?

## 2021-11-21 NOTE — Progress Notes (Signed)
? ?Established Patient Office Visit ? ?Subjective:  ?Patient ID: Tina Gomez, female    DOB: 09-28-1955  Age: 66 y.o. MRN: [id number] ? ?CC:  ?Chief Complaint  ?Patient presents with  ? Wound Check  ?  Foot  ? ? ?HPI ?Tina Gomez States that she has been having pain in both feet, worse in the right for the past two weeks, states that her pain has become severe and is unable to stand on her feet.  Does go to dialysis m/w/f, was unable to finish today due to her pain. ? ?Denies injury/ trauma to feet.  ? ? ?Past Medical History:  ?Diagnosis Date  ? Anemia   ? Chronic kidney disease   ? COPD (chronic obstructive pulmonary disease) ([place]) 05/2019  ? no inhaler  ? Diabetes mellitus without complication ([place])   ? no meds - diet controlled  ? History of blood transfusion 09/2020  ? 1 unit  ? HLD (hyperlipidemia)   ? Hypertension   ? PONV (postoperative nausea and vomiting)   ? Restless legs syndrome (RLS)   ? Stroke [facility])   ? ? ?Past Surgical History:  ?Procedure Laterality Date  ? AV FISTULA PLACEMENT Left 07/27/2020  ? Procedure: LEFT BRACHIO-BASILIC ARTERIOVENOUS (AV) FISTULA CREATION;  Surgeon: Cherre Robins, MD;  Location: [facility];  Service: Vascular;  Laterality: Left;  ? BASCILIC VEIN TRANSPOSITION Left 11/02/2020  ? Procedure: LEFT UPPER ARM SECOND STAGE BASILIC VEIN TRANSPOSITION;  Surgeon: Cherre Robins, MD;  Location: [facility] OR;  Service: Vascular;  Laterality: Left;  PERIPHERAL NERVE BLOCK  ? CESAREAN SECTION    ? x 1  ? ESOPHAGOGASTRODUODENOSCOPY (EGD) WITH PROPOFOL N/A 05/26/2020  ? Procedure: ESOPHAGOGASTRODUODENOSCOPY (EGD) WITH PROPOFOL;  Surgeon: Irene Shipper, MD;  Location: [facility] ENDOSCOPY;  Service: Endoscopy;  Laterality: N/A;  ? INSERTION OF DIALYSIS CATHETER Right 07/27/2020  ? Procedure: INSERTION OF RIGHT INTERNAL JUGULAR TUNNELED DIALYSIS CATHETER;  Surgeon: Cherre Robins, MD;  Location: [facility];  Service: Vascular;  Laterality: Right;  ? IR FLUORO GUIDE CV LINE RIGHT  07/25/2020  ? IR US GUIDE  VASC ACCESS RIGHT  07/25/2020  ? LEFT HEART CATH AND CORONARY ANGIOGRAPHY N/A 11/28/2020  ? Procedure: LEFT HEART CATH AND CORONARY ANGIOGRAPHY;  Surgeon: Troy Sine, MD;  Location: [place] CV LAB;  Service: Cardiovascular;  Laterality: N/A;  ? TONSILLECTOMY    ? UPPER GI ENDOSCOPY    ? growth removed from voice box  ? WRIST SURGERY Left   ? ganglion cyst removal  ? ? ?Family History  ?Problem Relation Age of Onset  ? Stroke Mother   ? Hypertension Mother   ? Hyperlipidemia Mother   ? Hypertension Father   ? Hyperlipidemia Father   ? ? ?Social History  ? ?Socioeconomic History  ? Marital status: Divorced  ?  Spouse name: Not on file  ? Number of children: Not on file  ? Years of education: Not on file  ? Highest education level: Not on file  ?Occupational History  ? Occupation: disabled  ?Tobacco Use  ? Smoking status: Every Day  ?  Packs/day: 1.00  ?  Years: 47.00  ?  Pack years: 47.00  ?  Types: Cigarettes  ? Smokeless tobacco: Never  ? Tobacco comments:  ?  5 cigs daily  ?Vaping Use  ? Vaping Use: Never used  ?Substance and Sexual Activity  ? Alcohol use: No  ? Drug use: No  ? Sexual activity: Not on file  ?  Other Topics Concern  ? Not on file  ?Social History Narrative  ? Not on file  ? ?Social Determinants of Health  ? ?Financial Resource Strain: Not on file  ?Food Insecurity: Not on file  ?Transportation Needs: Not on file  ?Physical Activity: Not on file  ?Stress: Not on file  ?Social Connections: Not on file  ?Intimate Partner Violence: Not on file  ? ? ?Outpatient Medications Prior to Visit  ?Medication Sig Dispense Refill  ? acetaminophen (TYLENOL) 500 MG tablet Take 1,000 mg by mouth every 6 (six) hours as needed for mild pain or headache.    ? albuterol (PROVENTIL) (2.5 MG/3ML) 0.083% nebulizer solution Take 3 mLs (2.5 mg total) by nebulization every 6 (six) hours as needed for wheezing or shortness of breath. (Patient taking differently: Take 2.5 mg by nebulization at bedtime.) 75 mL 0  ?  albuterol (VENTOLIN HFA) 108 (90 Base) MCG/ACT inhaler Inhale 2 puffs into the lungs every 6 (six) hours as needed for wheezing or shortness of breath. 18 g 2  ? Aspirin-Caffeine (BAYER BACK & BODY PO) Take 1 tablet by mouth daily as needed (headache/pain).    ? atorvastatin (LIPITOR) 40 MG tablet Take 1 tablet (40 mg total) by mouth daily at 6 PM.    ? B Complex-C-Zn-Folic Acid (DIALYVITE 938 WITH ZINC) 0.8 MG TABS Take 1 tablet by mouth at bedtime.    ? Blood Glucose Monitoring Suppl (BLOOD GLUCOSE METER) kit Use as instructed 1 each 0  ? Blood Glucose Monitoring Suppl (TRUE METRIX METER) DEVI 1 each by Does not apply route 3 (three) times daily before meals. 1 each 0  ? carvedilol (COREG) 12.5 MG tablet Take 1 tablet (12.5 mg total) by mouth 2 (two) times daily with a meal. (Patient taking differently: Take 12.5 mg by mouth See admin instructions. Take one tablet (12.5 mg) by mouth on Sunday, Tuesday, Thursday mornings (non-dialysis days); take one tablet (12.5 mg) daily at bedtime)    ? FLUoxetine (PROZAC) 20 MG capsule Take 1 capsule (20 mg total) by mouth daily. (Patient taking differently: Take 20 mg by mouth at bedtime.) 30 capsule 2  ? gabapentin (NEURONTIN) 100 MG capsule Take 1 capsule (100 mg total) by mouth 2 (two) times daily. (Patient taking differently: Take 200 mg by mouth at bedtime.) 60 capsule 0  ? glucose blood (TRUETEST TEST) test strip Use as instructed 100 each 12  ? hydrOXYzine (ATARAX) 25 MG tablet Take 25 mg by mouth 3 (three) times daily as needed.    ? Lancets (FREESTYLE) lancets Use as instructed 100 each 12  ? lidocaine-prilocaine (EMLA) cream Apply 1 application topically See admin instructions. Apply topically to port access one hour prior to dialysis - Monday, Wednesday, Friday, Saturday    ? loperamide (IMODIUM A-D) 2 MG tablet Take 1 tablet (2 mg total) by mouth 4 (four) times daily as needed for diarrhea or loose stools. (Patient taking differently: Take 2 mg by mouth See admin  instructions. Take one tablet (2 mg) by mouth prior to dialysis on Monday, Wednesday, Friday and Saturday) 30 tablet 1  ? losartan (COZAAR) 100 MG tablet Take 1 tablet (100 mg total) by mouth every evening. (Patient taking differently: Take 100 mg by mouth See admin instructions. Take one tablet (100 mg) by mouth at noon on Sunday, Tuesday, Thursday (non-dialysis days); take one tablet (100 mg) at bedtime on Monday, Wednesday, Friday, Saturday (dialysis days)) 30 tablet 6  ? Misc. Devices MISC Large depends. Diagnosis -  fecal incontinence 1 each 0  ? Misc. Devices MISC Rolling walker with seat 1 each 0  ? Misc. Devices MISC Wheelchair with Accessories: elevating leg rests (ELRs), wheel locks, extensions and anti-tippers. Cane or walker will not suffice 1 each 0  ? Misc. Devices MISC Portable oxygen concentrator. Chronic respiratory failure - 4L oxygen qhs and prn 1 each 0  ? ondansetron (ZOFRAN) 4 MG tablet Take 1 tablet (4 mg total) by mouth every 8 (eight) hours as needed for nausea or vomiting. 60 tablet 3  ? OXYGEN Inhale 4 L/min into the lungs continuous.    ? Tiotropium Bromide Monohydrate (SPIRIVA RESPIMAT) 2.5 MCG/ACT AERS Inhale 2 puffs into the lungs daily. 4 g 3  ? TRUEplus Lancets 28G MISC 1 each by Does not apply route 3 (three) times daily before meals. 100 each 12  ? ?No facility-administered medications prior to visit.  ? ? ?Allergies  ?Allergen Reactions  ? Codeine Nausea And Vomiting  ? Adhesive [Tape] Other (See Comments)  ?  Tape breaks out the skin if it is left on for a lengthy period of time  ? ? ?ROS ?Review of Systems  ?Constitutional:  Positive for fatigue. Negative for chills and fever.  ?HENT: Negative.    ?Eyes: Negative.   ?Respiratory:  Positive for shortness of breath.   ?Cardiovascular: Negative.   ?Gastrointestinal: Negative.   ?Endocrine: Negative.   ?Genitourinary: Negative.   ?Musculoskeletal:  Positive for gait problem.  ?Skin:  Positive for color change.  ?Allergic/Immunologic:  Negative.   ?Neurological:  Positive for weakness. Negative for speech difficulty.  ?Hematological: Negative.   ?Psychiatric/Behavioral: Negative.    ? ?  ?Objective:  ?  ?Physical Exam ?Vitals and nursing no

## 2021-11-21 NOTE — Progress Notes (Signed)
ANTICOAGULATION CONSULT NOTE - Initial Consult ? ?Pharmacy Consult for heparin ?Indication:  limb ischemia ? ?Allergies  ?Allergen Reactions  ? Codeine Nausea And Vomiting  ? Adhesive [Tape] Other (See Comments)  ?  Tape breaks out the skin if it is left on for a lengthy period of time  ? ? ?Patient Measurements: ?Height: 5\' 2"  (157.5 cm) ?Weight: 77.1 kg (170 lb) ?IBW/kg (Calculated) : 50.1 ?Heparin Dosing Weight: 67 ? ?Vital Signs: ?Temp: 99 ?F (37.2 ?C) (03/15 1655) ?Temp Source: Oral (03/15 1655) ?BP: 189/63 (03/15 1850) ?Pulse Rate: 59 (03/15 1850) ? ?Labs: ?Recent Labs  ?  11/21/21 ?1842  ?HGB 12.4  ?HCT 40.5  ?PLT 232  ?CREATININE 4.14*  ? ? ?Estimated Creatinine Clearance: 13.2 mL/min (A) (by C-G formula based on SCr of 4.14 mg/dL (H)). ? ? ?Medical History: ?Past Medical History:  ?Diagnosis Date  ? Anemia   ? Chronic kidney disease   ? COPD (chronic obstructive pulmonary disease) ([place]) 05/2019  ? no inhaler  ? Diabetes mellitus without complication ([place])   ? no meds - diet controlled  ? History of blood transfusion 09/2020  ? 1 unit  ? HLD (hyperlipidemia)   ? Hypertension   ? PONV (postoperative nausea and vomiting)   ? Restless legs syndrome (RLS)   ? Stroke [facility])   ? ? ?Medications:  ?(Not in a hospital admission)  ?Scheduled:  ?Infusions:  ? ?Assessment: ?Patient presents after intermittent pain in her foot for 3-4 weeks with redness bilaterally. Today she noticed toe discoloration. Pedal pulses on doppler are weak. Patient reports bleeding during every dialysis session where her fistula must be clamped for 30 minutes after dialysis ends. Patient is not on anticoagulation PTA per chart review and patient report.  Pharmacy consulted to dose heparin. ? ?Goal of Therapy:  ?Heparin level 0.3-0.7 units/ml ?Monitor platelets by anticoagulation protocol: Yes ?  ?Plan:  ?Bolus with 3000 units of heparin ?Start heparin at 1050 units/hr ?Check 8 hour heparin  ?Daily heparin level and CBC ? ? ?Thank you for  allowing pharmacy to participate in this patient's care. ? ?Reatha Harps, PharmD ?PGY1 Pharmacy Resident ?11/21/2021 8:17 PM ?Check [email or web address] for unit specific pharmacy number ? ? ? ?

## 2021-11-21 NOTE — Telephone Encounter (Signed)
I spoke to patient's daughter, [name], and informed her that her mother can be seen today @ 1500 @ [facility].  She said she knows the address for that clinic.  ?

## 2021-11-21 NOTE — ED Provider Notes (Signed)
?[facility] ?Provider Note ? ? ?CSN: [id number] ?Arrival date & time: 11/21/21  1641 ? ?  ? ?History ? ?Chief Complaint  ?Patient presents with  ? Foot Problem  ? ? ?Tina Gomez is a 66 y.o. 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  Patient last went to dialysis today. ? ?Presents to the emergency department with a chief complaint of foot pain and concern for ischemia.  Patient reports that she has been dealing with pain to the soles of her feet for the last 2 weeks.  Pain began as intermittent however has been constant over the last 3 to 4 days.  Pain is worse in her right foot.  Pain is worse with touch and weightbearing.  Patient denies any recent falls or injuries.  Patient states that 2 days prior her daughter noticed that her toes were red in color.  Today patient reports violaceous color to her toes.  Patient endorses that feet have felt cool to the touch over the last few days.  Additionally patient reports wound to right great toe after accidentally burning herself on a electric heater.  Patient states that she went to her primary care provider today and was sent to the emergency department due to concern for limb ischemia.  Denies any recent falls or injuries. ? ?Patient is not on any blood thinners.  Does take aspirin daily.  Receives heparin during dialysis treatment. ? ?Denies any fever, chills, numbness, weakness, purulent discharge, chest pain, shortness of breath. ? ?HPI ? ?  ? ?Home Medications ?Prior to Admission medications   ?Medication Sig Start Date End Date Taking? Authorizing Provider  ?acetaminophen (TYLENOL) 500 MG tablet Take 1,000 mg by mouth every 6 (six) hours as needed for mild pain or headache.    [provider]  ?albuterol (PROVENTIL) (2.5 MG/3ML) 0.083% nebulizer solution Take 3 mLs (2.5 mg  total) by nebulization every 6 (six) hours as needed for wheezing or shortness of breath. ?Patient taking differently: Take 2.5 mg by nebulization at bedtime. 07/12/21   Charlott Rakes, MD  ?albuterol (VENTOLIN HFA) 108 (90 Base) MCG/ACT inhaler Inhale 2 puffs into the lungs every 6 (six) hours as needed for wheezing or shortness of breath. 07/11/21   Charlott Rakes, MD  ?Aspirin-Caffeine (BAYER BACK & BODY PO) Take 1 tablet by mouth daily as needed (headache/pain).    [provider]  ?atorvastatin (LIPITOR) 40 MG tablet Take 1 tablet (40 mg total) by mouth daily at 6 PM. 04/15/21   Jennye Boroughs, MD  ?B Complex-C-Zn-Folic Acid (DIALYVITE 062 WITH ZINC) 0.8 MG TABS Take 1 tablet by mouth at bedtime. 08/12/20   [provider]  ?Blood Glucose Monitoring Suppl (BLOOD GLUCOSE METER) kit Use as instructed 11/01/13   Reyne Dumas, MD  ?Blood Glucose Monitoring Suppl (TRUE METRIX METER) DEVI 1 each by Does not apply route 3 (three) times daily before meals. 06/02/20   Charlott Rakes, MD  ?carvedilol (COREG) 12.5 MG tablet Take 1 tablet (12.5 mg total) by mouth 2 (two) times daily with a meal. ?Patient taking differently: Take 12.5 mg by mouth See admin instructions. Take one tablet (12.5 mg) by mouth on Sunday, Tuesday, Thursday mornings (non-dialysis days); take one tablet (12.5 mg) daily at bedtime 04/15/21 04/15/22  Jennye Boroughs, MD  ?FLUoxetine (PROZAC) 20 MG capsule Take 1 capsule (20 mg total)  by mouth daily. ?Patient taking differently: Take 20 mg by mouth at bedtime. 07/11/21   Charlott Rakes, MD  ?gabapentin (NEURONTIN) 100 MG capsule Take 1 capsule (100 mg total) by mouth 2 (two) times daily. ?Patient taking differently: Take 200 mg by mouth at bedtime. 10/01/21   Charlott Rakes, MD  ?glucose blood (TRUETEST TEST) test strip Use as instructed 06/02/20   Charlott Rakes, MD  ?hydrOXYzine (ATARAX) 25 MG tablet Take 25 mg by mouth 3 (three) times daily as needed. 10/15/21   [provider]   ?Lancets (FREESTYLE) lancets Use as instructed 11/01/13   Reyne Dumas, MD  ?lidocaine-prilocaine (EMLA) cream Apply 1 application topically See admin instructions. Apply topically to port access one hour prior to dialysis - Monday, Wednesday, Friday, Saturday 12/04/20   [provider]  ?loperamide (IMODIUM A-D) 2 MG tablet Take 1 tablet (2 mg total) by mouth 4 (four) times daily as needed for diarrhea or loose stools. ?Patient taking differently: Take 2 mg by mouth See admin instructions. Take one tablet (2 mg) by mouth prior to dialysis on Monday, Wednesday, Friday and Saturday 09/26/21   Charlott Rakes, MD  ?losartan (COZAAR) 100 MG tablet Take 1 tablet (100 mg total) by mouth every evening. ?Patient taking differently: Take 100 mg by mouth See admin instructions. Take one tablet (100 mg) by mouth at noon on Sunday, Tuesday, Thursday (non-dialysis days); take one tablet (100 mg) at bedtime on Monday, Wednesday, Friday, Saturday (dialysis days) 05/01/21   Charlott Rakes, MD  ?Misc. Devices MISC Large depends. Diagnosis - fecal incontinence 01/30/21   Charlott Rakes, MD  ?Misc. Devices MISC Rolling walker with seat 08/14/21   Charlott Rakes, MD  ?Misc. Devices MISC Wheelchair with Accessories: elevating leg rests (ELRs), wheel locks, extensions and anti-tippers. Cane or walker will not suffice 08/14/21   Charlott Rakes, MD  ?Misc. Devices MISC Portable oxygen concentrator. Chronic respiratory failure - 4L oxygen qhs and prn 08/14/21   Charlott Rakes, MD  ?ondansetron (ZOFRAN) 4 MG tablet Take 1 tablet (4 mg total) by mouth every 8 (eight) hours as needed for nausea or vomiting. 06/19/21   Charlott Rakes, MD  ?OXYGEN Inhale 4 L/min into the lungs continuous.    [provider]  ?Tiotropium Bromide Monohydrate (SPIRIVA RESPIMAT) 2.5 MCG/ACT AERS Inhale 2 puffs into the lungs daily. 01/30/21   Charlott Rakes, MD  ?TRUEplus Lancets 28G MISC 1 each by Does not apply route 3 (three) times daily before  meals. 06/02/20   Charlott Rakes, MD  ?amLODipine (NORVASC) 10 MG tablet Take 1 tablet (10 mg total) by mouth daily. 08/01/20 11/09/20  Terrilee Croak, MD  ?omeprazole (PRILOSEC) 20 MG capsule TAKE 1 CAPSULE (20 MG TOTAL) BY MOUTH 2 (TWO) TIMES DAILY BEFORE A MEAL. 08/01/20 08/22/20  Charlott Rakes, MD  ?   ? ?Allergies    ?Codeine and Adhesive [tape]   ? ?Review of Systems   ?Review of Systems  ?Constitutional:  Negative for chills and fever.  ?Eyes:  Negative for visual disturbance.  ?Respiratory:  Negative for shortness of breath.   ?Cardiovascular:  Negative for chest pain.  ?Gastrointestinal:  Negative for abdominal pain, nausea and vomiting.  ?Musculoskeletal:  Negative for back pain and neck pain.  ?Skin:  Positive for color change and wound. Negative for rash.  ?Neurological:  Negative for dizziness, syncope, weakness, light-headedness, numbness and headaches.  ?Psychiatric/Behavioral:  Negative for confusion.   ? ?Physical Exam ?Updated Vital Signs ?BP (!) 177/60   Pulse 63  Temp 99 ?F (37.2 ?C) (Oral)   Resp 20   Ht _0  (1.575 m)   Wt 77.1 kg   LMP  (LMP Unknown)   SpO2 99%   BMI 31.09 kg/m?  ?Physical Exam ?Vitals and nursing note reviewed.  ?Constitutional:   ?   General: She is not in acute distress. ?   Appearance: She is obese. She is not ill-appearing, toxic-appearing or diaphoretic.  ?   Interventions: Nasal cannula in place.  ?   Comments: On 4 LPM of O2 via nasal cannula  ?HENT:  ?   Head: Normocephalic.  ?Eyes:  ?   General: No scleral icterus.    ?   Right eye: No discharge.     ?   Left eye: No discharge.  ?Cardiovascular:  ?   Rate and Rhythm: Normal rate.  ?   Pulses:     ?     Radial pulses are 2+ on the right side and 1+ on the left side.  ?Pulmonary:  ?   Effort: Pulmonary effort is normal.  ?Feet:  ?   Comments: Right DP has monophasic Doppler.  Faint biphasic Doppler to left DP.  5 mm wound to right great toe with no surrounding induration or purulent discharge.  Violaceous hue  to patient's toes.  Diffuse tenderness to soles of bilateral feet. ?Skin: ?   General: Skin is warm and dry.  ?Neurological:  ?   General: No focal deficit present.  ?   Mental Status: She is alert.  ?Psychiatric:

## 2021-11-21 NOTE — Assessment & Plan Note (Addendum)
Now full comfort care. ?

## 2021-11-21 NOTE — Hospital Course (Addendum)
Tina Gomez is a 66 y.o. female with medical history significant for ESRD on MWFSa HD, COPD, chronic respiratory failure with hypoxia on 3-4 L supplemental O2 via [place], history of CVA, CAD, HFrEF (EF 30% 11/08/2020), diet-controlled T2DM, HTN, HLD, depression/anxiety, tobacco use who is admitted with bilateral lower limb ischemia.  Started on IV heparin and seen by vascular surgery. Aortogram performed with lower extremity runoff confirms severe PAD bilaterally. Recommendations have included strong consideration for transition to hospice care given her elevated risk of mortality/morbidity with operative management. The patient has been clear that she does not desire transition to comfort care nor amputation, but wants to proceed with revascularization. Cardiology has provided risk assessment with no recommendations for further work up.  ? ?Patient underwent endarterectomy and right fem-pop bypasses for critical limb ischemia on 11/28/2021.  She was transferred from [facility] service to [facility] hospitalist service on 12/01/2021.  Therapy recommended CIR.  However, after further goals of care discussion patient and family decided to pursue full comfort care and to stop hemodialysis and further intervention. ? ?Patient is transferred to residential hospice at the [facility] on 12/03/2021 for end-of-life care. ? ?

## 2021-11-21 NOTE — Assessment & Plan Note (Addendum)
-  SSI discontinued, full comfort care status ?

## 2021-11-21 NOTE — Assessment & Plan Note (Addendum)
Full comfort care ?

## 2021-11-21 NOTE — Assessment & Plan Note (Addendum)
Ativan as needed for anxiety. ?

## 2021-11-21 NOTE — Telephone Encounter (Signed)
?  Chief Complaint: blisters ?Symptoms: blisters on feet and wound on R foot, R foot bluish color ?Frequency: unsure timeframe ?Pertinent Negatives: unable to walk on feet without severe pain ?Disposition: [] ED /[] Urgent Care (no appt availability in office) / [x] Appointment(In office/virtual)/ []  [facility]/ [] Home Care/ [] Refused Recommended Disposition /[] [facility]/ []  Follow-up with PCP ?Additional Notes: no appts available with office, called and spoke with [facility] and was able to get an appt tomorrow 11/22/21 at 1320.  ? ? ?Reason for Disposition ? [1] Blister on foot AND [2] diabetes mellitus or peripheral vascular disease ("poor circulation") ? ?Answer Assessment - Initial Assessment Questions ?2. SIZE: "How large is the blister?" (inches, cm or compare to coins) ?    Very small  ?3. LOCATION: "Where are the blisters located?"  ?    On feet and R foot turning blue and has open  ?4. WHEN: "When did the blister happen?" ?    Unsure just noticed last night ? ?6. PAIN: "Does it hurt?" If Yes, ask: "How bad is the pain?"  (Scale 1-10; or mild, moderate, severe) ?    Moderate  ?7. OTHER SYMPTOMS: "Do you have any other symptoms?" (e.g., fever) ?    Unable to walk to ? ?Protocols used: Blister - Foot and Hand-A-AH ? ?

## 2021-11-21 NOTE — Consult Note (Addendum)
?Hospital Consult ? ? ? ?Reason for Consult:  painful feet ?Requesting Physician:  Neomia Dear, [place] ?MRN #:  [id number] ? ?History of Present Illness: This is a 66 y.o. female who presented to the hospital with bilateral feet pain.  She states that the bottom of both feet hurt.  This has been going on for a couple of weeks.  The right foot is worse than the left.  She states she has red bumps on the bottom of her feet.  She does have a sore around the great toe bilaterally from a heater that has been present for a couple of weeks. She states they are getting better.  She denies any pain in her calves with walking.  She states her feet feel better in bed.  They hurt when she stands.   ? ?She is on HD M/W/F/S at the [facility] location.  She has a left arm BVT fistula.  She has diabetes that is diet controlled.  She is on 3-4LO2NC at home.   She smokes one cigarette per day.  ? ?The pt is on a statin for cholesterol management.  ?The pt is not on a daily aspirin.   Other AC:  none ?The pt is on BB, ARB for hypertension.   ?The pt is diabetic.  Diet controlled  ?Tobacco hx:  see above ? ?Past Medical History:  ?Diagnosis Date  ? Anemia   ? Chronic kidney disease   ? COPD (chronic obstructive pulmonary disease) ([place]) 05/2019  ? no inhaler  ? Diabetes mellitus without complication ([place])   ? no meds - diet controlled  ? History of blood transfusion 09/2020  ? 1 unit  ? HLD (hyperlipidemia)   ? Hypertension   ? PONV (postoperative nausea and vomiting)   ? Restless legs syndrome (RLS)   ? Stroke [facility])   ? ? ?Past Surgical History:  ?Procedure Laterality Date  ? AV FISTULA PLACEMENT Left 07/27/2020  ? Procedure: LEFT BRACHIO-BASILIC ARTERIOVENOUS (AV) FISTULA CREATION;  Surgeon: Cherre Lovey Crupi, MD;  Location: [place];  Service: Vascular;  Laterality: Left;  ? BASCILIC VEIN TRANSPOSITION Left 11/02/2020  ? Procedure: LEFT UPPER ARM SECOND STAGE BASILIC VEIN TRANSPOSITION;  Surgeon: Cherre Zian Delair, MD;  Location: [facility] OR;  Service:  Vascular;  Laterality: Left;  PERIPHERAL NERVE BLOCK  ? CESAREAN SECTION    ? x 1  ? ESOPHAGOGASTRODUODENOSCOPY (EGD) WITH PROPOFOL N/A 05/26/2020  ? Procedure: ESOPHAGOGASTRODUODENOSCOPY (EGD) WITH PROPOFOL;  Surgeon: Irene Shipper, MD;  Location[facility] ENDOSCOPY;  Service: Endoscopy;  Laterality: N/A;  ? INSERTION OF DIALYSIS CATHETER Right 07/27/2020  ? Procedure: INSERTION OF RIGHT INTERNAL JUGULAR TUNNELED DIALYSIS CATHETER;  Surgeon: Cherre Lareta Bruneau, MD;  Location: [place];  Service: Vascular;  Laterality: Right;  ? IR FLUORO GUIDE CV LINE RIGHT  07/25/2020  ? IR US GUIDE VASC ACCESS RIGHT  07/25/2020  ? LEFT HEART CATH AND CORONARY ANGIOGRAPHY N/A 11/28/2020  ? Procedure: LEFT HEART CATH AND CORONARY ANGIOGRAPHY;  Surgeon: Troy Sine, MD;  Location: [place] CV LAB;  Service: Cardiovascular;  Laterality: N/A;  ? TONSILLECTOMY    ? UPPER GI ENDOSCOPY    ? growth removed from voice box  ? WRIST SURGERY Left   ? ganglion cyst removal  ? ? ?Allergies  ?Allergen Reactions  ? Codeine Nausea And Vomiting  ? Adhesive [Tape] Other (See Comments)  ?  Tape breaks out the skin if it is left on for a lengthy period of time  ? ? ?  Prior to Admission medications   ?Medication Sig Start Date End Date Taking? Authorizing Provider  ?acetaminophen (TYLENOL) 500 MG tablet Take 1,000 mg by mouth every 6 (six) hours as needed for mild pain or headache.    [provider]  ?albuterol (PROVENTIL) (2.5 MG/3ML) 0.083% nebulizer solution Take 3 mLs (2.5 mg total) by nebulization every 6 (six) hours as needed for wheezing or shortness of breath. ?Patient taking differently: Take 2.5 mg by nebulization at bedtime. 07/12/21   Charlott Rakes, MD  ?albuterol (VENTOLIN HFA) 108 (90 Base) MCG/ACT inhaler Inhale 2 puffs into the lungs every 6 (six) hours as needed for wheezing or shortness of breath. 07/11/21   Charlott Rakes, MD  ?Aspirin-Caffeine (BAYER BACK & BODY PO) Take 1 tablet by mouth daily as needed (headache/pain).     [provider]  ?atorvastatin (LIPITOR) 40 MG tablet Take 1 tablet (40 mg total) by mouth daily at 6 PM. 04/15/21   Jennye Boroughs, MD  ?B Complex-C-Zn-Folic Acid (DIALYVITE 628 WITH ZINC) 0.8 MG TABS Take 1 tablet by mouth at bedtime. 08/12/20   [provider]  ?Blood Glucose Monitoring Suppl (BLOOD GLUCOSE METER) kit Use as instructed 11/01/13   Reyne Dumas, MD  ?Blood Glucose Monitoring Suppl (TRUE METRIX METER) DEVI 1 each by Does not apply route 3 (three) times daily before meals. 06/02/20   Charlott Rakes, MD  ?carvedilol (COREG) 12.5 MG tablet Take 1 tablet (12.5 mg total) by mouth 2 (two) times daily with a meal. ?Patient taking differently: Take 12.5 mg by mouth See admin instructions. Take one tablet (12.5 mg) by mouth on Sunday, Tuesday, Thursday mornings (non-dialysis days); take one tablet (12.5 mg) daily at bedtime 04/15/21 04/15/22  Jennye Boroughs, MD  ?FLUoxetine (PROZAC) 20 MG capsule Take 1 capsule (20 mg total) by mouth daily. ?Patient taking differently: Take 20 mg by mouth at bedtime. 07/11/21   Charlott Rakes, MD  ?gabapentin (NEURONTIN) 100 MG capsule Take 1 capsule (100 mg total) by mouth 2 (two) times daily. ?Patient taking differently: Take 200 mg by mouth at bedtime. 10/01/21   Charlott Rakes, MD  ?glucose blood (TRUETEST TEST) test strip Use as instructed 06/02/20   Charlott Rakes, MD  ?hydrOXYzine (ATARAX) 25 MG tablet Take 25 mg by mouth 3 (three) times daily as needed. 10/15/21   [provider]  ?Lancets (FREESTYLE) lancets Use as instructed 11/01/13   Reyne Dumas, MD  ?lidocaine-prilocaine (EMLA) cream Apply 1 application topically See admin instructions. Apply topically to port access one hour prior to dialysis - Monday, Wednesday, Friday, Saturday 12/04/20   [provider]  ?loperamide (IMODIUM A-D) 2 MG tablet Take 1 tablet (2 mg total) by mouth 4 (four) times daily as needed for diarrhea or loose stools. ?Patient taking differently: Take 2 mg by  mouth See admin instructions. Take one tablet (2 mg) by mouth prior to dialysis on Monday, Wednesday, Friday and Saturday 09/26/21   Charlott Rakes, MD  ?losartan (COZAAR) 100 MG tablet Take 1 tablet (100 mg total) by mouth every evening. ?Patient taking differently: Take 100 mg by mouth See admin instructions. Take one tablet (100 mg) by mouth at noon on Sunday, Tuesday, Thursday (non-dialysis days); take one tablet (100 mg) at bedtime on Monday, Wednesday, Friday, Saturday (dialysis days) 05/01/21   Charlott Rakes, MD  ?Misc. Devices MISC Large depends. Diagnosis - fecal incontinence 01/30/21   Charlott Rakes, MD  ?Misc. Devices MISC Rolling walker with seat 08/14/21   Charlott Rakes, MD  ?Misc.  Devices MISC Wheelchair with Accessories: elevating leg rests (ELRs), wheel locks, extensions and anti-tippers. Cane or walker will not suffice 08/14/21   Charlott Rakes, MD  ?Misc. Devices MISC Portable oxygen concentrator. Chronic respiratory failure - 4L oxygen qhs and prn 08/14/21   Charlott Rakes, MD  ?ondansetron (ZOFRAN) 4 MG tablet Take 1 tablet (4 mg total) by mouth every 8 (eight) hours as needed for nausea or vomiting. 06/19/21   Charlott Rakes, MD  ?OXYGEN Inhale 4 L/min into the lungs continuous.    [provider]  ?Tiotropium Bromide Monohydrate (SPIRIVA RESPIMAT) 2.5 MCG/ACT AERS Inhale 2 puffs into the lungs daily. 01/30/21   Charlott Rakes, MD  ?TRUEplus Lancets 28G MISC 1 each by Does not apply route 3 (three) times daily before meals. 06/02/20   Charlott Rakes, MD  ?amLODipine (NORVASC) 10 MG tablet Take 1 tablet (10 mg total) by mouth daily. 08/01/20 11/09/20  Terrilee Croak, MD  ?omeprazole (PRILOSEC) 20 MG capsule TAKE 1 CAPSULE (20 MG TOTAL) BY MOUTH 2 (TWO) TIMES DAILY BEFORE A MEAL. 08/01/20 08/22/20  Charlott Rakes, MD  ? ? ?Social History  ? ?Socioeconomic History  ? Marital status: Divorced  ?  Spouse name: Not on file  ? Number of children: Not on file  ? Years of education: Not on  file  ? Highest education level: Not on file  ?Occupational History  ? Occupation: disabled  ?Tobacco Use  ? Smoking status: Every Day  ?  Packs/day: 1.00  ?  Years: 47.00  ?  Pack years: 47.00  ?  Types: Cigar

## 2021-11-21 NOTE — Assessment & Plan Note (Addendum)
-  Stable. No new WMA on echo. Recent cath with 70% RCA stenosis managed medically without other obstructive disease. No anginal complaints since that time.  ?- No further work up currently recommended per cardiology pre-operative risk assessment consultation on 3/19.  ?

## 2021-11-21 NOTE — ED Triage Notes (Signed)
EDP at bedside to doppler pulses at this time. Pt has positive weak doppler pulse to the L pedal, and a questionable monophasic dopplered sound to the R pedal pulse. Pt has 2+ femoral pulses bilat.  ?

## 2021-11-21 NOTE — Progress Notes (Signed)
ABI's have been completed. ?Preliminary results can be found in CV Proc through chart review.  ?Results were given to [facility] PA. ? ?11/21/21 6:38 PM ?Tina Gomez RVT   ?

## 2021-11-21 NOTE — Patient Instructions (Signed)
Please call to schedule follow up after discharge from ED / hospital ? ?Kennieth Rad, PA-C ?Physician Assistant ?[place] ?[email or web address] ? ? ? ?

## 2021-11-22 ENCOUNTER — Ambulatory Visit: Payer: Medicare Other | Admitting: Nurse Practitioner

## 2021-11-22 DIAGNOSIS — I70223 Atherosclerosis of native arteries of extremities with rest pain, bilateral legs: Secondary | ICD-10-CM | POA: Diagnosis not present

## 2021-11-22 LAB — HEPARIN LEVEL (UNFRACTIONATED)
Heparin Unfractionated: <0.1 IU/mL — ABNORMAL LOW (ref 0.30–0.70)
Heparin Unfractionated: <0.1 IU/mL — ABNORMAL LOW (ref 0.30–0.70)
Heparin Unfractionated: <0.1 IU/mL — ABNORMAL LOW (ref 0.30–0.70)

## 2021-11-22 LAB — CBC
HCT: 36.9 % (ref 36.0–46.0)
Hemoglobin: 10.8 g/dL — ABNORMAL LOW (ref 12.0–15.0)
MCH: 26.6 pg (ref 26.0–34.0)
MCHC: 29.3 g/dL — ABNORMAL LOW (ref 30.0–36.0)
MCV: 90.9 fL (ref 80.0–100.0)
Platelets: 200 10*3/uL (ref 150–400)
RBC: 4.06 MIL/uL (ref 3.87–5.11)
RDW: 17.1 % — ABNORMAL HIGH (ref 11.5–15.5)
WBC: 6.2 10*3/uL (ref 4.0–10.5)
nRBC: 0 % (ref 0.0–0.2)

## 2021-11-22 LAB — BASIC METABOLIC PANEL
Anion gap: 17 — ABNORMAL HIGH (ref 5–15)
BUN: 34 mg/dL — ABNORMAL HIGH (ref 8–23)
CO2: 26 mmol/L (ref 22–32)
Calcium: 8.7 mg/dL — ABNORMAL LOW (ref 8.9–10.3)
Chloride: 94 mmol/L — ABNORMAL LOW (ref 98–111)
Creatinine, Ser: 4.7 mg/dL — ABNORMAL HIGH (ref 0.44–1.00)
GFR, Estimated: 10 mL/min — ABNORMAL LOW (ref >60)
Glucose, Bld: 141 mg/dL — ABNORMAL HIGH (ref 70–99)
Potassium: 3.4 mmol/L — ABNORMAL LOW (ref 3.5–5.1)
Sodium: 137 mmol/L (ref 135–145)

## 2021-11-22 LAB — POCT GLYCOSYLATED HEMOGLOBIN (HGB A1C): Hemoglobin A1C: 6.3 % — AB (ref 4.0–5.6)

## 2021-11-22 LAB — GLUCOSE, CAPILLARY
Glucose-Capillary: 117 mg/dL — ABNORMAL HIGH (ref 70–99)
Glucose-Capillary: 118 mg/dL — ABNORMAL HIGH (ref 70–99)
Glucose-Capillary: 127 mg/dL — ABNORMAL HIGH (ref 70–99)
Glucose-Capillary: 107 mg/dL — ABNORMAL HIGH (ref 70–99)

## 2021-11-22 MED ORDER — SODIUM CHLORIDE 0.9 % IV SOLN
50.0000 mg | INTRAVENOUS | Status: DC
  Filled 2021-11-22: qty 2.5

## 2021-11-22 MED ORDER — HEPARIN BOLUS VIA INFUSION
3000.0000 [IU] | Freq: Once | INTRAVENOUS | Status: AC
  Administered 2021-11-22: 3000 [IU] via INTRAVENOUS
  Filled 2021-11-22: qty 3000

## 2021-11-22 MED ORDER — DOXERCALCIFEROL 4 MCG/2ML IV SOLN
1.0000 ug | INTRAVENOUS | Status: DC
  Administered 2021-11-24 – 2021-11-30 (×3): 1 ug via INTRAVENOUS
  Filled 2021-11-22 (×6): qty 2

## 2021-11-22 MED ORDER — POTASSIUM CHLORIDE CRYS ER 20 MEQ PO TBCR
20.0000 meq | EXTENDED_RELEASE_TABLET | Freq: Once | ORAL | Status: AC
  Administered 2021-11-22: 20 meq via ORAL
  Filled 2021-11-22: qty 1

## 2021-11-22 MED ORDER — CALCIUM ACETATE (PHOS BINDER) 667 MG PO CAPS
2001.0000 mg | ORAL_CAPSULE | Freq: Three times a day (TID) | ORAL | Status: DC
  Administered 2021-11-22 – 2021-11-23 (×2): 2001 mg via ORAL
  Filled 2021-11-22 (×3): qty 3

## 2021-11-22 NOTE — Progress Notes (Addendum)
ANTICOAGULATION CONSULT NOTE - Follow Up Consult ? ?Pharmacy Consult for heparin ?Indication:  limb ischemia ? ?Allergies  ?Allergen Reactions  ? Codeine Nausea And Vomiting  ? Adhesive [Tape] Other (See Comments)  ?  Tape breaks out the skin if it is left on for a lengthy period of time  ? ? ?Patient Measurements: ?Height: 5\' 2"  (157.5 cm) ?Weight: 76.2 kg (167 lb 15.9 oz) ?IBW/kg (Calculated) : 50.1 ?Heparin Dosing Weight: 67 kg ? ?Vital Signs: ?Temp: 98 ?F (36.7 ?C) (03/16 1146) ?Temp Source: Oral (03/16 1146) ?BP: 117/63 (03/16 1146) ?Pulse Rate: 57 (03/16 1146) ? ?Labs: ?Recent Labs  ?  11/21/21 ?1842 11/21/21 ?1930 11/21/21 ?2010 11/22/21 ?0336 11/22/21 ?1308  ?HGB 12.4  --   --  10.8*  --   ?HCT 40.5  --   --  36.9  --   ?PLT 232  --   --  200  --   ?APTT  --   --  39*  --   --   ?LABPROT  --   --  14.7  --   --   ?INR  --   --  1.2  --   --   ?HEPARINUNFRC  --  <0.10*  --  <0.10* <0.10*  ?CREATININE 4.14*  --   --  4.70*  --   ? ? ? ?Estimated Creatinine Clearance: 11.5 mL/min (A) (by C-G formula based on SCr of 4.7 mg/dL (H)). ? ? ?Assessment: ?Patient presents after intermittent pain in her foot for 3-4 weeks with redness bilaterally. Patient reports bleeding during every dialysis session where her fistula must be clamped for 30 minutes after dialysis ends. Patient is not on anticoagulation PTA per chart review and patient report. Pharmacy consulted to dose heparin. ? ?Heparin level is subtherapeutic <0.1, on heparin 1300 units/hr. Hgb 10.8, plt 200. No s/sx of bleeding. Of note, IV site is bleeding. Discussed with RN, having to draw from same arm as infusion given restricted access on opposite arm - when flushed IV line not flushing so concern IV not working. Heparin was stopped and plan for IV team to get new access on patient.  ? ?Goal of Therapy:  ?Heparin level 0.3-0.7 units/ml ?Monitor platelets by anticoagulation protocol: Yes ?  ?Plan:  ?Restart heparin infusion at 1300 units/hr (no bolus given  bleeding at IV site) ?Check anti-Xa level in 8 hours after restart and daily while on heparin ?Continue to monitor H&H and platelets ? ?Antonietta Jewel, PharmD, BCCCP ?Clinical Pharmacist  ?Phone: [phone] ?11/22/2021 1:55 PM ? ?Please check AMION for all [place] phone numbers ?After 10:00 PM, call Centre [phone] ? ? ? ?

## 2021-11-22 NOTE — Progress Notes (Signed)
?PROGRESS NOTE ? ?Tina Gomez  YHC:[id number] DOB: 1955/10/05 DOA: 11/21/2021 ?PCP: Charlott Rakes, MD  ? ?Brief Narrative: ?Tina Gomez is a 66 y.o. 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  Started on IV heparin and seen by vascular surgery.  Plan for aortogram with right leg runoff and possible intervention on 3/17  ? ?Assessment & Plan: ? ?Principal Problem: ?  Critical limb ischemia of both lower extremities ([place]) ?Active Problems: ?  ESRD on hemodialysis ([place]) ?  Chronic systolic CHF (congestive heart failure) ([place]) ?  Chronic respiratory failure with hypoxia ([facility]) ?  COPD (chronic obstructive pulmonary disease) ([employer]) ?  Essential hypertension, benign ?  Type 2 diabetes mellitus with diabetic neuropathy, unspecified ([place]) ?  CAD (coronary artery disease) ?  Hyperlipidemia ?  Anxiety and depression ? ? ?Assessment and Plan: ?* Critical limb ischemia of both lower extremities ([place]) ?Seen by vascular surgery, felt to have bilateral lower extremity critical limb ischemia.  CT angiogram showed heavily calcified atheromatous plaque limiting evaluation of aortoiliac system and runoff vessels.  Multifocal segmental occlusions of the bilateral superficial femoral arteries and popliteal arteries as well as extensive small vessel disease throughout the runoff vessels bilaterally noted. ?-Continue IV heparin ?-Continue aspirin and atorvastatin ?-Vascular surgery planning  for aortogram with right leg runoff and possible intervention on 3/17 ? ?ESRD on hemodialysis ([place]) ?Dialyzes MWFSa, completed routine HD 3/15.  No emergent need for dialysis at time of admission. ?-Nephrology will be notified ? ?Chronic systolic CHF (congestive heart failure) ([place]) ?Stable  without evidence of volume overload.  Last EF 30% 11/08/2020.  Continue Coreg.  Volume managed with dialysis. ? ?COPD (chronic obstructive pulmonary disease) ([place]) ?Chronic respiratory failure with hypoxia ?Stable on home 3-4 L O2 via [place] without evidence of acute exacerbation. ?-Continue Spiriva and albuterol as needed ? ?Essential hypertension, benign ?Resume home Coreg and losartan. ? ?Type 2 diabetes mellitus with diabetic neuropathy, unspecified ([place]) ?Diet controlled.  Placed on very sensitive SSI while in hospital. ? ?CAD (coronary artery disease) ?Stable, denies any chest pain.  Continue aspirin and statin. ? ?Hyperlipidemia ?Continue atorvastatin. ? ?Anxiety and depression ?Continue Prozac and hydroxyzine as needed. ? ? ? ? ?  ? ? ?  ?  ? ?DVT prophylaxis:Heparin iv ? ? ?  Code Status: DNR ? ?Family Communication: None at bedside ? ?Patient status:inpatient ? ?Patient is from :home ? ?Anticipated discharge DV:VOHY ? ?Estimated DC date: After vascular surgery clearance ? ? ?Consultants: Vascular surgery, nephrology ? ?Procedures: None yet ? ?Antimicrobials:  ?Anti-infectives (From admission, onward)  ? ? None  ? ?  ? ? ?Subjective: ?Patient seen and examined at the bedside this morning.  Hemodynamically stable.  Looks comfortable.  She says she feels much better today.  Pain on the bilateral feet is much better today. ? ?Objective: ?Vitals:  ? 11/22/21 0247 11/22/21 0507 11/22/21 0735 11/22/21 0740  ?BP: (!) 140/51 (!) 128/41 (!) 174/60 (!) 157/55  ?Pulse:  (!) 56 60 (!) 58  ?Resp:  16 16 15   ?Temp:  97.7 ?F (36.5 ?C) 97.8 ?F (36.6 ?C) 97.8 ?F (36.6 ?C)  ?TempSrc:  Oral Oral Oral  ?SpO2:  90% 92% 91%  ?Weight:      ?Height:      ? ? ?  Intake/Output Summary (Last 24 hours) at 11/22/2021 0827 ?Last data filed at 11/22/2021 0300 ?Gross per 24 hour  ?Intake 97.8 ml  ?Output --  ?Net 97.8 ml  ? ?Filed Weights  ? 11/21/21 1702 11/22/21 0216  ?Weight: 77.1 kg 76.2 kg  ? ? ?Examination: ? ?General exam: Overall  comfortable, not in distress, deconditioned, chronically ill looking ?HEENT: PERRL ?Respiratory system:  no wheezes or crackles  ?Cardiovascular system: S1 & S2 heard, RRR.  ?Gastrointestinal system: Abdomen is nondistended, soft and nontender. ?Central nervous system: Alert and oriented ?Extremities: No edema, no clubbing , AV fistula on the left upper extremity, reddish discoloration of bilateral toes, peripheral pulses could not be felt but bilateral feet were warm ?Skin: No rashes, no ulcers,no icterus   ? ? ?Data Reviewed: I have personally reviewed following labs and imaging studies ? ?CBC: ?Recent Labs  ?Lab 11/21/21 ?1842 11/22/21 ?0336  ?WBC 6.5 6.2  ?NEUTROABS 4.2  --   ?HGB 12.4 10.8*  ?HCT 40.5 36.9  ?MCV 90.4 90.9  ?PLT 232 200  ? ?Basic Metabolic Panel: ?Recent Labs  ?Lab 11/21/21 ?1842 11/22/21 ?0336  ?NA 136 137  ?K 3.4* 3.4*  ?CL 95* 94*  ?CO2 27 26  ?GLUCOSE 97 141*  ?BUN 29* 34*  ?CREATININE 4.14* 4.70*  ?CALCIUM 9.0 8.7*  ? ? ? ?Recent Results (from the past 240 hour(s))  ?Resp Panel by RT-PCR (Flu A&B, Covid) Nasopharyngeal Swab     Status: None  ? Collection Time: 11/21/21 10:41 PM  ? Specimen: Nasopharyngeal Swab; Nasopharyngeal(NP) swabs in vial transport medium  ?Result Value Ref Range Status  ? SARS Coronavirus 2 by RT PCR NEGATIVE NEGATIVE Final  ?  Comment: (NOTE) ?SARS-CoV-2 target nucleic acids are NOT DETECTED. ? ?The SARS-CoV-2 RNA is generally detectable in upper respiratory ?specimens during the acute phase of infection. The lowest ?concentration of SARS-CoV-2 viral copies this assay can detect is ?138 copies/mL. A negative result does not preclude SARS-Cov-2 ?infection and should not be used as the sole basis for treatment or ?other patient management decisions. A negative result may occur with  ?improper specimen collection/handling, submission of specimen other ?than nasopharyngeal swab, presence of viral mutation(s) within the ?areas targeted by this assay, and inadequate number  of viral ?copies(<138 copies/mL). A negative result must be combined with ?clinical observations, patient history, and epidemiological ?information. The expected result is Negative. ? ?Fact Sheet for Patients:  ?EntrepreneurPulse.com.au ? ?Fact Sheet for Healthcare Providers:  ?IncredibleEmployment.be ? ?This test is no t yet approved or cleared by the [place] FDA and  ?has been authorized for detection and/or diagnosis of SARS-CoV-2 by ?FDA under an Emergency Use Authorization (EUA). This EUA will remain  ?in effect (meaning this test can be used) for the duration of the ?COVID-19 declaration under Section 564(b)(1) of the Act, 21 ?U.S.C.section 360bbb-3(b)(1), unless the authorization is terminated  ?or revoked sooner.  ? ? ?  ? Influenza A by PCR NEGATIVE NEGATIVE Final  ? Influenza B by PCR NEGATIVE NEGATIVE Final  ?  Comment: (NOTE) ?The Xpert Xpress SARS-CoV-2/FLU/RSV plus assay is intended as an aid ?in the diagnosis of influenza from Nasopharyngeal swab specimens and ?should not be used as a sole basis for treatment. Nasal washings and ?aspirates are unacceptable for Xpert Xpress SARS-CoV-2/FLU/RSV ?testing. ? ?Fact Sheet for Patients: ?EntrepreneurPulse.com.au ? ?Fact Sheet for Healthcare Providers: ?IncredibleEmployment.be ? ?This test is not yet approved or cleared by the [place] FDA and ?has been authorized for detection and/or diagnosis  of SARS-CoV-2 by ?FDA under an Emergency Use Authorization (EUA). This EUA will remain ?in effect (meaning this test can be used) for the duration of the ?COVID-19 declaration under Section 564(b)(1) of the Act, 21 U.S.C. ?section 360bbb-3(b)(1), unless the authorization is terminated or ?revoked. ? ?Performed at [facility], [place] [address], [place], [place] ?[address] ?  ?  ? ?Radiology Studies: ?CT Angio Aortobifemoral W and/or Wo Contrast ? ?Result Date: 11/21/2021 ?CLINICAL DATA:   Bilateral foot pain for several weeks, right greater than left EXAM: CT ANGIOGRAPHY OF ABDOMINAL AORTA WITH ILIOFEMORAL RUNOFF TECHNIQUE: Multidetector CT imaging of the abdomen, pelvis and lower extremities

## 2021-11-22 NOTE — Progress Notes (Signed)
ANTICOAGULATION CONSULT NOTE - Follow Up Consult ? ?Pharmacy Consult for heparin ?Indication:  limb ischemia ? ?Allergies  ?Allergen Reactions  ? Codeine Nausea And Vomiting  ? Adhesive [Tape] Other (See Comments)  ?  Tape breaks out the skin if it is left on for a lengthy period of time  ? ? ?Patient Measurements: ?Height: 5\' 2"  (157.5 cm) ?Weight: 76.2 kg (167 lb 15.9 oz) ?IBW/kg (Calculated) : 50.1 ?Heparin Dosing Weight: 67 kg ? ?Vital Signs: ?Temp: 98 ?F (36.7 ?C) (03/16 0216) ?Temp Source: Oral (03/16 0216) ?BP: 140/51 (03/16 0247) ?Pulse Rate: 61 (03/16 0216) ? ?Labs: ?Recent Labs  ?  11/21/21 ?1842 11/21/21 ?1930 11/21/21 ?2010 11/22/21 ?0336  ?HGB 12.4  --   --  10.8*  ?HCT 40.5  --   --  36.9  ?PLT 232  --   --  200  ?APTT  --   --  39*  --   ?LABPROT  --   --  14.7  --   ?INR  --   --  1.2  --   ?HEPARINUNFRC  --  <0.10*  --  <0.10*  ?CREATININE 4.14*  --   --  4.70*  ? ? ?Estimated Creatinine Clearance: 11.5 mL/min (A) (by C-G formula based on SCr of 4.7 mg/dL (H)). ? ? ?Assessment: ?Patient presents after intermittent pain in her foot for 3-4 weeks with redness bilaterally. Patient reports bleeding during every dialysis session where her fistula must be clamped for 30 minutes after dialysis ends. Patient is not on anticoagulation PTA per chart review and patient report. Pharmacy consulted to dose heparin. ? ?Heparin level returned subtherapeutic at <0.10 on 1050 units/hr. ? ?Goal of Therapy:  ?Heparin level 0.3-0.7 units/ml ?Monitor platelets by anticoagulation protocol: Yes ?  ?Plan:  ?Give 3000 units bolus x 1 ?Increase heparin infusion to 1300 units/hr ?Check anti-Xa level in 8 hours and daily while on heparin ?Continue to monitor H&H and platelets ? ?Carma Lair, PharmD Candidate [phone] ?11/22/2021,4:38 AM ? ? ?

## 2021-11-22 NOTE — Addendum Note (Signed)
Addended by: Trecia Rogers on: 11/22/2021 04:02 PM ? ? Modules accepted: Orders ? ?

## 2021-11-22 NOTE — Progress Notes (Signed)
Mobility Specialist Progress Note  ? ? 11/22/21 1507  ?Mobility  ?Activity Contraindicated/medical hold  ? ?RN advised pt in pain. Will hold off until after procedure.  ? ?Hildred Alamin ?Mobility Specialist  ?M.S. 5N: [phone]  ?

## 2021-11-22 NOTE — Consult Note (Addendum)
[facility] ?Renal Consultation Note ? ?Indication for Consultation:  Management of ESRD/hemodialysis; anemia, hypertension/volume and secondary hyperparathyroidism ? ?HPI: Tina Gomez is a 66 y.o. female ho ESRD chronic HD MWFSAT ( 4x/wk sec vol ^  cm  EF 30%) ho past uncontrolled diabetes,  CAD,HTN, gastroparesis, CVA, tobacco abuse. Depression /anxiety  ?Admitted yesterday after sent from PCP office with presenting with  2 weeks increasing pain of bilateral feet and discolored "purplish feet " In ER admitted with with high concern for extremity limb ischemia.  She did have her normal outpatient HD treatment yesterday.  ? ?Evaluated by VVS with plans for CT angiogram IV heparin ? ?Currently feet pain/discoloration improving drastically since admitted on heparin, no shortness of breath. ? No complaints of chest pain dyspnea , fever chills ,abdominal pain.  Has had some bouts of gastroparesis flareup recently.  Admit lab NA 136K3.4 BUN 29 CR 4.4, WBC 6.5 Hgb 12.4 ? ? ? ?  ?Past Medical History:  ?Diagnosis Date  ? Anemia   ? Chronic kidney disease   ? Chronic respiratory failure with hypoxia (HCC)   ? COPD (chronic obstructive pulmonary disease) ([place]) 05/2019  ? no inhaler  ? Diabetes mellitus without complication ([place])   ? no meds - diet controlled  ? ESRD on hemodialysis ([place])   ? History of blood transfusion 09/2020  ? 1 unit  ? HLD (hyperlipidemia)   ? Hypertension   ? PONV (postoperative nausea and vomiting)   ? Restless legs syndrome (RLS)   ? Stroke [facility])   ? ? ?Past Surgical History:  ?Procedure Laterality Date  ? AV FISTULA PLACEMENT Left 07/27/2020  ? Procedure: LEFT BRACHIO-BASILIC ARTERIOVENOUS (AV) FISTULA CREATION;  Surgeon: Cherre Robins, MD;  Location: [facility];  Service: Vascular;  Laterality: Left;  ? BASCILIC VEIN TRANSPOSITION Left 11/02/2020  ? Procedure: LEFT UPPER ARM SECOND STAGE BASILIC VEIN TRANSPOSITION;  Surgeon: Cherre Robins, MD;  Location: [facility] OR;  Service: Vascular;   Laterality: Left;  PERIPHERAL NERVE BLOCK  ? CESAREAN SECTION    ? x 1  ? ESOPHAGOGASTRODUODENOSCOPY (EGD) WITH PROPOFOL N/A 05/26/2020  ? Procedure: ESOPHAGOGASTRODUODENOSCOPY (EGD) WITH PROPOFOL;  Surgeon: Irene Shipper, MD;  Location: [facility] ENDOSCOPY;  Service: Endoscopy;  Laterality: N/A;  ? INSERTION OF DIALYSIS CATHETER Right 07/27/2020  ? Procedure: INSERTION OF RIGHT INTERNAL JUGULAR TUNNELED DIALYSIS CATHETER;  Surgeon: Cherre Robins, MD;  Location: [facility];  Service: Vascular;  Laterality: Right;  ? IR FLUORO GUIDE CV LINE RIGHT  07/25/2020  ? IR US GUIDE VASC ACCESS RIGHT  07/25/2020  ? LEFT HEART CATH AND CORONARY ANGIOGRAPHY N/A 11/28/2020  ? Procedure: LEFT HEART CATH AND CORONARY ANGIOGRAPHY;  Surgeon: Troy Sine, MD;  Location: [facility] CV LAB;  Service: Cardiovascular;  Laterality: N/A;  ? TONSILLECTOMY    ? UPPER GI ENDOSCOPY    ? growth removed from voice box  ? WRIST SURGERY Left   ? ganglion cyst removal  ? ? ?  ?Family History  ?Problem Relation Age of Onset  ? Stroke Mother   ? Hypertension Mother   ? Hyperlipidemia Mother   ? Hypertension Father   ? Hyperlipidemia Father   ? ? ?  reports that she has been smoking cigarettes. She has a 47.00 pack-year smoking history. She has never used smokeless tobacco. She reports that she does not drink alcohol and does not use drugs. ?  ?Allergies  ?Allergen Reactions  ? Codeine Nausea And Vomiting  ?  Adhesive [Tape] Other (See Comments)  ?  Tape breaks out the skin if it is left on for a lengthy period of time  ? ? ?Prior to Admission medications   ?Medication Sig Start Date End Date Taking? Authorizing Provider  ?acetaminophen (TYLENOL) 500 MG tablet Take 1,000 mg by mouth every 6 (six) hours as needed for mild pain or headache.   Yes [provider]  ?albuterol (PROVENTIL) (2.5 MG/3ML) 0.083% nebulizer solution Take 3 mLs (2.5 mg total) by nebulization every 6 (six) hours as needed for wheezing or shortness of breath. ?Patient taking  differently: Take 2.5 mg by nebulization at bedtime. 07/12/21  Yes Charlott Rakes, MD  ?albuterol (VENTOLIN HFA) 108 (90 Base) MCG/ACT inhaler Inhale 2 puffs into the lungs every 6 (six) hours as needed for wheezing or shortness of breath. 07/11/21  Yes Charlott Rakes, MD  ?aspirin 325 MG tablet Take 325 mg by mouth daily.   Yes [provider]  ?Aspirin-Caffeine (BAYER BACK & BODY PO) Take 1 tablet by mouth daily as needed (headache/pain).   Yes [provider]  ?atorvastatin (LIPITOR) 40 MG tablet Take 1 tablet (40 mg total) by mouth daily at 6 PM. 04/15/21  Yes Jennye Boroughs, MD  ?B Complex-C-Zn-Folic Acid (DIALYVITE 426 WITH ZINC) 0.8 MG TABS Take 1 tablet by mouth at bedtime. 08/12/20  Yes [provider]  ?carvedilol (COREG) 12.5 MG tablet Take 1 tablet (12.5 mg total) by mouth 2 (two) times daily with a meal. ?Patient taking differently: Take 12.5 mg by mouth See admin instructions. Take one tablet (12.5 mg) by mouth on Sunday, Tuesday, Thursday mornings (non-dialysis days); take one tablet (12.5 mg) daily at bedtime 04/15/21 04/15/22 Yes Jennye Boroughs, MD  ?FLUoxetine (PROZAC) 20 MG capsule Take 1 capsule (20 mg total) by mouth daily. ?Patient taking differently: Take 20 mg by mouth at bedtime. 07/11/21  Yes Charlott Rakes, MD  ?gabapentin (NEURONTIN) 100 MG capsule Take 1 capsule (100 mg total) by mouth 2 (two) times daily. ?Patient taking differently: Take 200 mg by mouth at bedtime. 10/01/21  Yes Charlott Rakes, MD  ?hydrOXYzine (ATARAX) 25 MG tablet Take 25 mg by mouth 3 (three) times daily as needed for anxiety. 10/15/21  Yes [provider]  ?lidocaine-prilocaine (EMLA) cream Apply 1 application topically See admin instructions. Apply topically to port access one hour prior to dialysis - Monday, Wednesday, Friday, Saturday 12/04/20  Yes [provider]  ?loperamide (IMODIUM A-D) 2 MG tablet Take 1 tablet (2 mg total) by mouth 4 (four) times daily as needed for  diarrhea or loose stools. ?Patient taking differently: Take 2 mg by mouth See admin instructions. Take one tablet (2 mg) by mouth prior to dialysis on Monday, Wednesday, Friday and Saturday 09/26/21  Yes Newlin, Charlane Ferretti, MD  ?losartan (COZAAR) 100 MG tablet Take 1 tablet (100 mg total) by mouth every evening. ?Patient taking differently: Take 100 mg by mouth See admin instructions. Take one tablet (100 mg) by mouth at noon on Sunday, Tuesday, Thursday (non-dialysis days); take one tablet (100 mg) at bedtime on Monday, Wednesday, Friday, Saturday (dialysis days) 05/01/21  Yes Charlott Rakes, MD  ?ondansetron (ZOFRAN) 4 MG tablet Take 1 tablet (4 mg total) by mouth every 8 (eight) hours as needed for nausea or vomiting. 06/19/21  Yes Charlott Rakes, MD  ?Tiotropium Bromide Monohydrate (SPIRIVA RESPIMAT) 2.5 MCG/ACT AERS Inhale 2 puffs into the lungs daily. 01/30/21  Yes Charlott Rakes, MD  ?glucose blood (TRUETEST TEST) test strip Use as  instructed 06/02/20   Charlott Rakes, MD  ?Lancets (FREESTYLE) lancets Use as instructed 11/01/13   Reyne Dumas, MD  ?OXYGEN Inhale 4 L/min into the lungs continuous.    [provider]  ?TRUEplus Lancets 28G MISC 1 each by Does not apply route 3 (three) times daily before meals. 06/02/20   Charlott Rakes, MD  ?amLODipine (NORVASC) 10 MG tablet Take 1 tablet (10 mg total) by mouth daily. 08/01/20 11/09/20  Terrilee Croak, MD  ?omeprazole (PRILOSEC) 20 MG capsule TAKE 1 CAPSULE (20 MG TOTAL) BY MOUTH 2 (TWO) TIMES DAILY BEFORE A MEAL. 08/01/20 08/22/20  Charlott Rakes, MD  ? ?  ? ?Results for orders placed or performed during the hospital encounter of 11/21/21 (from the past 48 hour(s))  ?Comprehensive metabolic panel     Status: Abnormal  ? Collection Time: 11/21/21  6:42 PM  ?Result Value Ref Range  ? Sodium 136 135 - 145 mmol/L  ? Potassium 3.4 (L) 3.5 - 5.1 mmol/L  ? Chloride 95 (L) 98 - 111 mmol/L  ? CO2 27 22 - 32 mmol/L  ? Glucose, Bld 97 70 - 99 mg/dL  ?  Comment:  Glucose reference range applies only to samples taken after fasting for at least 8 hours.  ? BUN 29 (H) 8 - 23 mg/dL  ? Creatinine, Ser 4.14 (H) 0.44 - 1.00 mg/dL  ? Calcium 9.0 8.9 - 10.3 mg/dL  ? Total Protein 7

## 2021-11-22 NOTE — Progress Notes (Signed)
ANTICOAGULATION CONSULT NOTE - Follow Up Consult ? ?Pharmacy Consult for heparin ?Indication:  limb ischemia ? ?Allergies  ?Allergen Reactions  ? Codeine Nausea And Vomiting  ? Adhesive [Tape] Other (See Comments)  ?  Tape breaks out the skin if it is left on for a lengthy period of time  ? ? ?Patient Measurements: ?Height: 5\' 2"  (157.5 cm) ?Weight: 76.2 kg (167 lb 15.9 oz) ?IBW/kg (Calculated) : 50.1 ?Heparin Dosing Weight: 67 kg ? ?Vital Signs: ?Temp: 98.4 ?F (36.9 ?C) (03/16 1958) ?Temp Source: Oral (03/16 1958) ?BP: 151/61 (03/16 1958) ?Pulse Rate: 65 (03/16 2115) ? ?Labs: ?Recent Labs  ?  11/21/21 ?1842 11/21/21 ?1930 11/21/21 ?2010 11/22/21 ?0336 11/22/21 ?1308 11/22/21 ?2125  ?HGB 12.4  --   --  10.8*  --   --   ?HCT 40.5  --   --  36.9  --   --   ?PLT 232  --   --  200  --   --   ?APTT  --   --  39*  --   --   --   ?LABPROT  --   --  14.7  --   --   --   ?INR  --   --  1.2  --   --   --   ?HEPARINUNFRC  --    < >  --  <0.10* <0.10* <0.10*  ?CREATININE 4.14*  --   --  4.70*  --   --   ? < > = values in this interval not displayed.  ? ? ?Estimated Creatinine Clearance: 11.5 mL/min (A) (by C-G formula based on SCr of 4.7 mg/dL (H)). ? ?Assessment: ?Patient presents after intermittent pain in her foot for 3-4 weeks with redness bilaterally. Patient reports bleeding during every dialysis session where her fistula must be clamped for 30 minutes after dialysis ends. Patient is not on anticoagulation PTA per chart review and patient report. Pharmacy consulted to dose heparin. ? ?Heparin level is subtherapeutic <0.1, on heparin 1300 units/hr. Hgb 10.8, plt 200. No s/sx of bleeding. Of note, IV site is bleeding. Discussed with RN, having to draw from same arm as infusion given restricted access on opposite arm - when flushed IV line not flushing so concern IV not working. Heparin was stopped and plan for IV team to get new access on patient.  ?  ?Heparin level returned subtherapeutic <0.1, on heparin 1300 units/hr.  Hgb 10.8, plt 200. Spoke with RN and new IV placed @1700  on 3/16. Per RN no more bleeding, infusion running without issue. Due to previous bleed will not bolus at this time. ? ? ?Goal of Therapy:  ?Heparin level 0.3-0.7 units/ml ?Monitor platelets by anticoagulation protocol: Yes ?  ?Plan:  ?Increase heparin infusion to 1600 units/hr ?Check anti-Xa level in 8 hours and daily while on heparin ?Continue to monitor H&H and platelets ? ?Carma Lair, PharmD Candidate [phone] ?11/22/2021,11:26 PM ? ? ?

## 2021-11-22 NOTE — Progress Notes (Addendum)
?  Progress Note ? ? ? ?11/22/2021 ?8:00 AM ?* No surgery found * ? ?Subjective:  Bottom of feet hurt only with pressure, especially when standing.  Denies pain in feet when in bed ? ? ?Vitals:  ? 11/22/21 0735 11/22/21 0740  ?BP: (!) 174/60 (!) 157/55  ?Pulse: 60 (!) 58  ?Resp: 16 15  ?Temp: 97.8 ?F (36.6 ?C) 97.8 ?F (36.6 ?C)  ?SpO2: 92% 91%  ? ?Physical Exam: ?Lungs:  non labored ?Extremities:  feet symmetrically warm; no pedal pulses ?Abdomen:  soft, NT, ND ?Neurologic: A&O ? ?CBC ?   ?Component Value Date/Time  ? WBC 6.2 11/22/2021 0336  ? RBC 4.06 11/22/2021 0336  ? HGB 10.8 (L) 11/22/2021 0336  ? HGB 9.5 (L) 02/06/2021 5038  ? HCT 36.9 11/22/2021 0336  ? HCT 31.3 (L) 02/06/2021 8828  ? PLT 200 11/22/2021 0336  ? PLT 299 02/06/2021 0927  ? MCV 90.9 11/22/2021 0336  ? MCV 93 02/06/2021 0927  ? MCH 26.6 11/22/2021 0336  ? MCHC 29.3 (L) 11/22/2021 0336  ? RDW 17.1 (H) 11/22/2021 0336  ? RDW 15.8 (H) 02/06/2021 0034  ? LYMPHSABS 1.5 11/21/2021 1842  ? LYMPHSABS 1.6 02/06/2021 0927  ? MONOABS 0.6 11/21/2021 1842  ? EOSABS 0.1 11/21/2021 1842  ? EOSABS 0.2 02/06/2021 0927  ? BASOSABS 0.0 11/21/2021 1842  ? BASOSABS 0.0 02/06/2021 9179  ? ? ?BMET ?   ?Component Value Date/Time  ? NA 137 11/22/2021 0336  ? NA 141 11/24/2020 1304  ? K 3.4 (L) 11/22/2021 0336  ? CL 94 (L) 11/22/2021 0336  ? CO2 26 11/22/2021 0336  ? GLUCOSE 141 (H) 11/22/2021 0336  ? BUN 34 (H) 11/22/2021 0336  ? BUN 47 (H) 11/24/2020 1304  ? CREATININE 4.70 (H) 11/22/2021 0336  ? CREATININE 1.25 (H) 11/13/2016 1018  ? CALCIUM 8.7 (L) 11/22/2021 0336  ? CALCIUM 5.8 (LL) 07/24/2020 0343  ? GFRNONAA 10 (L) 11/22/2021 0336  ? GFRNONAA 47 (L) 11/13/2016 1018  ? GFRAA 13 (L) 06/15/2020 1134  ? GFRAA 54 (L) 11/13/2016 1018  ? ? ?INR ?   ?Component Value Date/Time  ? INR 1.2 11/21/2021 2010  ? ? ? ?Intake/Output Summary (Last 24 hours) at 11/22/2021 0800 ?Last data filed at 11/22/2021 0300 ?Gross per 24 hour  ?Intake 97.8 ml  ?Output --  ?Net 97.8 ml   ? ? ? ?Assessment/Plan:  66 y.o. female with PAD and bilateral foot pain ? ?Foot pain improved with heparin and rest ?Plan is for Aortogram with right leg runoff and possible intervention tomorrow with Dr. Stanford Breed 11/23/21 ?Ok to eat today ?Npo past midnight ?Consent ordered ? ?Dagoberto Ligas, PA-C ?[facility] ?[phone] ?11/22/2021 ?8:00 AM ? ? ?VASCULAR STAFF ADDENDUM: ?I have independently interviewed and examined the patient. ?I agree with the above.  ? ? ?J. Melene Muller, MD ?[facility] ?Office Phone Number: [phone] ?11/22/2021 4:11 PM ? ? ?

## 2021-11-23 ENCOUNTER — Inpatient Hospital Stay (HOSPITAL_COMMUNITY)
Admission: EM | Disposition: A | Discharge status: Hospice | Payer: Self-pay | Source: Home / Self Care | Attending: Family Medicine

## 2021-11-23 DIAGNOSIS — I70223 Atherosclerosis of native arteries of extremities with rest pain, bilateral legs: Secondary | ICD-10-CM | POA: Diagnosis not present

## 2021-11-23 HISTORY — PX: LOWER EXTREMITY ANGIOGRAPHY: CATH118251

## 2021-11-23 LAB — GLUCOSE, CAPILLARY
Glucose-Capillary: 114 mg/dL — ABNORMAL HIGH (ref 70–99)
Glucose-Capillary: 87 mg/dL (ref 70–99)
Glucose-Capillary: 179 mg/dL — ABNORMAL HIGH (ref 70–99)

## 2021-11-23 LAB — CBC
HCT: 37.3 % (ref 36.0–46.0)
Hemoglobin: 11.2 g/dL — ABNORMAL LOW (ref 12.0–15.0)
MCH: 27 pg (ref 26.0–34.0)
MCHC: 30 g/dL (ref 30.0–36.0)
MCV: 89.9 fL (ref 80.0–100.0)
Platelets: 219 10*3/uL (ref 150–400)
RBC: 4.15 MIL/uL (ref 3.87–5.11)
RDW: 16.8 % — ABNORMAL HIGH (ref 11.5–15.5)
WBC: 6.8 10*3/uL (ref 4.0–10.5)
nRBC: 0 % (ref 0.0–0.2)

## 2021-11-23 LAB — HEPATITIS B SURFACE ANTIGEN: Hepatitis B Surface Ag: NONREACTIVE

## 2021-11-23 LAB — BASIC METABOLIC PANEL
Anion gap: 15 (ref 5–15)
BUN: 56 mg/dL — ABNORMAL HIGH (ref 8–23)
CO2: 25 mmol/L (ref 22–32)
Calcium: 9 mg/dL (ref 8.9–10.3)
Chloride: 94 mmol/L — ABNORMAL LOW (ref 98–111)
Creatinine, Ser: 7 mg/dL — ABNORMAL HIGH (ref 0.44–1.00)
GFR, Estimated: 6 mL/min — ABNORMAL LOW (ref >60)
Glucose, Bld: 113 mg/dL — ABNORMAL HIGH (ref 70–99)
Potassium: 4.7 mmol/L (ref 3.5–5.1)
Sodium: 134 mmol/L — ABNORMAL LOW (ref 135–145)

## 2021-11-23 LAB — CREATININE, SERUM
Creatinine, Ser: 7.69 mg/dL — ABNORMAL HIGH (ref 0.44–1.00)
GFR, Estimated: 5 mL/min — ABNORMAL LOW (ref >60)

## 2021-11-23 LAB — HEPARIN LEVEL (UNFRACTIONATED): Heparin Unfractionated: 0.11 IU/mL — ABNORMAL LOW (ref 0.30–0.70)

## 2021-11-23 LAB — POCT ACTIVATED CLOTTING TIME: Activated Clotting Time: 155 seconds

## 2021-11-23 SURGERY — LOWER EXTREMITY ANGIOGRAPHY
Anesthesia: LOCAL

## 2021-11-23 MED ORDER — LABETALOL HCL 5 MG/ML IV SOLN: 10.0000 mg | INTRAVENOUS | Status: DC | PRN

## 2021-11-23 MED ORDER — ONDANSETRON HCL 4 MG/2ML IJ SOLN
4.0000 mg | Freq: Four times a day (QID) | INTRAMUSCULAR | Status: DC | PRN
  Administered 2021-11-26 – 2021-11-30 (×5): 4 mg via INTRAVENOUS
  Filled 2021-11-23: qty 2

## 2021-11-23 MED ORDER — HEPARIN SODIUM (PORCINE) 5000 UNIT/ML IJ SOLN
5000.0000 [IU] | Freq: Three times a day (TID) | INTRAMUSCULAR | Status: DC
  Administered 2021-11-23 – 2021-11-27 (×13): 5000 [IU] via SUBCUTANEOUS
  Filled 2021-11-23 (×13): qty 1

## 2021-11-23 MED ORDER — ASPIRIN EC 81 MG PO TBEC: 81.0000 mg | DELAYED_RELEASE_TABLET | Freq: Every day | ORAL | Status: DC

## 2021-11-23 MED ORDER — FENTANYL CITRATE (PF) 100 MCG/2ML IJ SOLN
INTRAMUSCULAR | Status: AC
Start: 2021-11-23 — End: ?
  Filled 2021-11-23: qty 2

## 2021-11-23 MED ORDER — RENA-VITE PO TABS
1.0000 | ORAL_TABLET | Freq: Every day | ORAL | Status: DC
  Administered 2021-11-23 – 2021-11-30 (×7): 1 via ORAL
  Filled 2021-11-23 (×9): qty 1

## 2021-11-23 MED ORDER — MIDAZOLAM HCL 2 MG/2ML IJ SOLN
INTRAMUSCULAR | Status: DC | PRN
Start: 2021-11-23 — End: 2021-11-23
  Administered 2021-11-23: 1 mg via INTRAVENOUS

## 2021-11-23 MED ORDER — CHLORHEXIDINE GLUCONATE CLOTH 2 % EX PADS
6.0000 | MEDICATED_PAD | Freq: Every day | CUTANEOUS | Status: DC
  Administered 2021-11-23 – 2021-11-26 (×4): 6 via TOPICAL

## 2021-11-23 MED ORDER — HEPARIN SODIUM (PORCINE) 1000 UNIT/ML IJ SOLN
INTRAMUSCULAR | Status: DC | PRN
  Administered 2021-11-23: 6000 [IU] via INTRAVENOUS

## 2021-11-23 MED ORDER — HYDRALAZINE HCL 20 MG/ML IJ SOLN
INTRAMUSCULAR | Status: AC
  Filled 2021-11-23: qty 1

## 2021-11-23 MED ORDER — NEPRO/CARBSTEADY PO LIQD: 237.0000 mL | Freq: Two times a day (BID) | ORAL | Status: DC

## 2021-11-23 MED ORDER — HYDRALAZINE HCL 20 MG/ML IJ SOLN
5.0000 mg | INTRAMUSCULAR | Status: AC | PRN
  Administered 2021-11-23 (×2): 5 mg via INTRAVENOUS

## 2021-11-23 MED ORDER — SODIUM CHLORIDE 0.9% FLUSH
3.0000 mL | Freq: Two times a day (BID) | INTRAVENOUS | Status: DC
  Administered 2021-11-23 – 2021-12-01 (×15): 3 mL via INTRAVENOUS

## 2021-11-23 MED ORDER — SODIUM CHLORIDE 0.9% FLUSH: 3.0000 mL | INTRAVENOUS | Status: DC | PRN

## 2021-11-23 MED ORDER — LIDOCAINE HCL (PF) 1 % IJ SOLN
INTRAMUSCULAR | Status: AC
  Filled 2021-11-23: qty 30

## 2021-11-23 MED ORDER — FENTANYL CITRATE (PF) 100 MCG/2ML IJ SOLN
INTRAMUSCULAR | Status: DC | PRN
Start: 2021-11-23 — End: 2021-11-23
  Administered 2021-11-23: 50 ug via INTRAVENOUS
  Administered 2021-11-23: 50 ug

## 2021-11-23 MED ORDER — HEPARIN SODIUM (PORCINE) 1000 UNIT/ML IJ SOLN
INTRAMUSCULAR | Status: AC
  Filled 2021-11-23: qty 10

## 2021-11-23 MED ORDER — LABETALOL HCL 5 MG/ML IV SOLN
10.0000 mg | INTRAVENOUS | Status: DC | PRN
  Administered 2021-11-29 – 2021-12-01 (×2): 10 mg via INTRAVENOUS
  Filled 2021-11-23 (×2): qty 4

## 2021-11-23 MED ORDER — HEPARIN (PORCINE) IN NACL 1000-0.9 UT/500ML-% IV SOLN
INTRAVENOUS | Status: AC
  Filled 2021-11-23: qty 1000

## 2021-11-23 MED ORDER — SODIUM CHLORIDE 0.9 % IV SOLN: 250.0000 mL | INTRAVENOUS | Status: DC | PRN

## 2021-11-23 MED ORDER — IODIXANOL 320 MG/ML IV SOLN
INTRAVENOUS | Status: DC | PRN
  Administered 2021-11-23: 110 mL via INTRA_ARTERIAL
  Administered 2021-11-23: 100 mL via INTRA_ARTERIAL

## 2021-11-23 MED ORDER — ACETAMINOPHEN 325 MG PO TABS: 650.0000 mg | ORAL_TABLET | ORAL | Status: DC | PRN

## 2021-11-23 MED ORDER — ATORVASTATIN CALCIUM 40 MG PO TABS: 40.0000 mg | ORAL_TABLET | Freq: Every day | ORAL | Status: DC

## 2021-11-23 MED ORDER — MIDAZOLAM HCL 2 MG/2ML IJ SOLN
INTRAMUSCULAR | Status: AC
  Filled 2021-11-23: qty 2

## 2021-11-23 MED ORDER — ASPIRIN 325 MG PO TABS
325.0000 mg | ORAL_TABLET | Freq: Every day | ORAL | Status: DC
  Administered 2021-11-24 – 2021-11-30 (×6): 325 mg via ORAL
  Filled 2021-11-23 (×6): qty 1

## 2021-11-23 SURGICAL SUPPLY — 14 items
CATH OMNI FLUSH 5F 65CM (CATHETERS) ×1 IMPLANT
CATH QUICKCROSS ANG SELECT (CATHETERS) ×1 IMPLANT
CATH QUICKCROSS SUPP .035X90CM (MICROCATHETER) ×1 IMPLANT
GLIDEWIRE ADV .035X260CM (WIRE) ×1 IMPLANT
KIT MICROPUNCTURE NIT STIFF (SHEATH) ×1 IMPLANT
KIT PV (KITS) ×3 IMPLANT
SHEATH PINNACLE 5F 10CM (SHEATH) ×1 IMPLANT
SHEATH PINNACLE MP 6F 45CM (SHEATH) ×1 IMPLANT
SHEATH PROBE COVER 6X72 (BAG) ×1 IMPLANT
SYR MEDRAD MARK V 150ML (SYRINGE) ×1 IMPLANT
TRANSDUCER W/STOPCOCK (MISCELLANEOUS) ×3 IMPLANT
TRAY PV CATH (CUSTOM PROCEDURE TRAY) ×3 IMPLANT
WIRE BENTSON .035X145CM (WIRE) ×1 IMPLANT
WIRE G V18X300CM (WIRE) ×1 IMPLANT

## 2021-11-23 NOTE — Op Note (Signed)
DATE OF SERVICE: 11/23/2021 ? ?PATIENT:  Tina Gomez  66 y.o. female ? ?PRE-OPERATIVE DIAGNOSIS:  Atherosclerosis of native arteries of bilateral lower extremities causing ischemic rest pain ? ?POST-OPERATIVE DIAGNOSIS:  Same ? ?PROCEDURE:   ?1) US guided right common femoral access ?2) Aortogram with bilateral lower extremity runoff angiogram ?3) Left angiogram with third order cannulation  ?4) Conscious sedation (33 minutes) ? ?SURGEON:  [name]. Stanford Breed, MD ? ?ASSISTANT: none ? ?ANESTHESIA:   local and IV sedation ? ?ESTIMATED BLOOD LOSS: minimal ? ?LOCAL MEDICATIONS USED:  LIDOCAINE  ? ?COUNTS: confirmed correct. ? ?PATIENT DISPOSITION:  PACU - hemodynamically stable. ?  ?Delay start of Pharmacological VTE agent (>24hrs) due to surgical blood loss or risk of bleeding: no ? ?INDICATION FOR PROCEDURE: Tina Gomez is a 66 y.o. female with bilateral lower extremity ischemic rest pain. Preoperative CT angiogram was not diagnostic. After careful discussion of risks, benefits, and alternatives the patient was offered angiography with possible intervention. The patient  understood and wished to proceed. ? ?OPERATIVE FINDINGS:  ?Terminal aorta and iliac arteries: ?Heavily diseased, but widely patent ? ?Right lower extremity: ?Common femoral artery: 50% stenosis  ?Profunda femoris artery: Heavily diseased, but widely patent  ?Superficial femoral artery: Patent at its origin, but occludes. Heavily diseased. ?Popliteal artery: Reconstitution behind the knee ?Anterior tibial artery: Heavily diseased; dominant tibial  ?Tibioperoneal trunk: heavily diseased but patent ?Peroneal artery: heavily diseased, occludes mid calf ?Posterior tibial artery: ?Pedal circulation: not well seen. ? ?Left lower extremity: ?Common femoral artery: 70% stenosis  ?Profunda femoris artery: Heavily diseased, but widely patent  ?Superficial femoral artery: Patent at its origin, but occludes. Heavily diseased. ?Popliteal artery: Reconstitution  behind the knee ?Anterior tibial artery: Heavily diseased; dominant tibial  ?Tibioperoneal trunk: heavily diseased but patent ?Peroneal artery: heavily diseased, occludes mid calf ?Posterior tibial artery: ?Pedal circulation: not well seen. ? ?DESCRIPTION OF PROCEDURE: After identification of the patient in the pre-operative holding area, the patient was transferred to the operating room. The patient was positioned supine on the operating room table. Anesthesia was induced. The groins was prepped and draped in standard fashion. A surgical pause was performed confirming correct patient, procedure, and operative location. ? ?The right groin was anesthetized with subcutaneous injection of 1% lidocaine. Using ultrasound guidance, the right common femoral artery was accessed with micropuncture technique. Fluoroscopy was used to confirm cannulation over the femoral head. The 40F sheath was upsized to 67F.  ? ?A Benson wire was advanced into the distal aorta. Over the wire an omni flush catheter was advanced to the level of L2. Aortogram was performed - see above for details. Bilateral lower extremity runoff angiography was performed - see above for details. ? ?The left common iliac artery was selected with a glidewire guidewire. The wire was advanced into the common femoral artery. Over the wire the omni flush catheter was advanced into the external iliac artery. Selective angiography was performed - see above for details.  ? ?The decision was made to intervene. The patient was heparinized with 7000 units of heparin. The 67F sheath was exchanged for a 67F x 45cm sheath. Selective angiography of the left lower extremity was performed prior to intervention.  ? ?I tried to cross the left SFA occlusion but was not able to cross the heavily diseased lesion.  ? ?The sheath was left in place to be removed in the recovery area. ? ?Conscious sedation was administered with the use of IV fentanyl and midazolam under continuous physician  and nurse monitoring.  Heart rate, blood pressure, and oxygen saturation were continuously monitored.  Total sedation time was 33 minutes ? ?Upon completion of the case instrument and sharps counts were confirmed correct. The patient was transferred to the PACU in good condition. I was present for all portions of the procedure. ? ?PLAN: ASA 81mg  PO QD. High intensity statin. Needs bilateral fem pop bypasses. Will plan to do this in staged fashion. Will ask cardiology to evaluate for risk stratification. ? ?[name]. Stanford Breed, MD ?[facility] [place] ?Office Phone Number: [phone] ?11/23/2021 7:00 PM ? ?

## 2021-11-23 NOTE — Progress Notes (Signed)
?[facility] ?Progress Note  ? ?66 y.o. female ho ESRD chronic HD MWFSAT ( 4x/wk sec vol ^  cm  EF 30%) ho past uncontrolled diabetes,  CAD,HTN, gastroparesis, CVA, tobacco abuse  p/w worsened pain of bilateral feet and discolored "purplish feet " In ER admitted with with high concern for extremity limb ischemia.   ?  ?Evaluated by VVS with plans for CT angiogram IV heparin ?  ?Dialysis Orders: [facility]: [facility] MWF Saturday, 3 hours EDW 75 kg ?3K, 2.5 CA bath UF P2 ?Heparin 5000 units load, intermittent 2000  ?Hectorol 1 mic q. dialysis Venofer 50 weekly ?No current Mircera need ? ?Assessment/ Plan:   ?Bilateral lower extremity critical limb extremity ischemia= plans for PMD /VVS consulting  on IV heparin ?ESRD -HD MWF Saturday, currently on schedule ,next dialysis today (will work around VVS procedures ); possible HD Sat if excess volume -> planning for Sat as well as I don't want to perform emergent HD on Sun. ?Hypertension/volume  -no excess volume, BP labile, continue BP meds carvedilol, amlodipine, Cozaar, may be able to titrate down in hospital with HD, follow-up trend BP ?Anemia  -Hgb 11.2  no ESA needs weekly iron on HD ?Metabolic bone disease -calcium stable, continue Hectorol on HD phosphate binder with meals, follow-up calcium phosphorus trend ?History of chronic systolic CHF(last EF 59% 1/63/84= noted for 2D today, stressed patient volume control in hospital ?COPD/tobacco abuse= meds per admit on Spiriva albuterol, stressed dc tobacco ?DM type II per admit ?Nutrition -ALB 3.1 diet carb modified renal with fluid restriction renal vitamin protein supplement ?History of anxiety/depression= meds per admit ? ?Subjective:   ?Pain in feet b/l but denies f/cn/v/dyspnea  ? ?Objective:   ?BP (!) 145/52 (BP Location: Right Arm)   Pulse 62   Temp 98.4 ?F (36.9 ?C) (Oral)   Resp 16   Ht 5\' 2"  (1.575 m)   Wt 76.8 kg   LMP  (LMP Unknown)   SpO2 96%   BMI 30.97 kg/m?  ? ?Intake/Output Summary  (Last 24 hours) at 11/23/2021 0746 ?Last data filed at 11/22/2021 1800 ?Gross per 24 hour  ?Intake 360 ml  ?Output --  ?Net 360 ml  ? ?Weight change: -0.318 kg ? ?Physical Exam: ?General: Alert pleasant female NAD ?HEENT: [place], EOMI ?Neck: Supple no JVD ?Heart: RRR no MRG ?Lungs: Slightly decreased breath sounds bases  ?Abdomen: NABS S NT ND ?Extremities: No pedal edema ?Skin: Dry warm, right great toe tip ulcer no discharge ?Neuro: Alert .O x3 ,moves all extremities  ?Dialysis Access: LUA AVF +bruit ? ?Imaging: ?CT Angio Aortobifemoral W and/or Wo Contrast ? ?Result Date: 11/21/2021 ?CLINICAL DATA:  Bilateral foot pain for several weeks, right greater than left EXAM: CT ANGIOGRAPHY OF ABDOMINAL AORTA WITH ILIOFEMORAL RUNOFF TECHNIQUE: Multidetector CT imaging of the abdomen, pelvis and lower extremities was performed using the standard protocol during bolus administration of intravenous contrast. Multiplanar CT image reconstructions and MIPs were obtained to evaluate the vascular anatomy. RADIATION DOSE REDUCTION: This exam was performed according to the departmental dose-optimization program which includes automated exposure control, adjustment of the mA and/or kV according to patient size and/or use of iterative reconstruction technique. CONTRAST:  165mL OMNIPAQUE IOHEXOL 350 MG/ML SOLN COMPARISON:  None. FINDINGS: VASCULAR Aorta: Normal caliber aorta without aneurysm, dissection, vasculitis or significant stenosis. Severe diffuse atherosclerosis. Celiac: Patent without evidence of aneurysm, dissection, vasculitis or significant stenosis. Severe diffuse atherosclerosis, with multifocal high-grade narrowing throughout the splenic artery. SMA: Patent without evidence of  aneurysm, dissection, vasculitis or significant stenosis. Severe diffuse atherosclerosis. Renals: Severe diffuse bilateral atherosclerosis. Estimated 70-99% stenosis within the origin of the bilateral renal arteries. No evidence of aneurysm, dissection,  or vasculitis. IMA: Patent without evidence of aneurysm, dissection, vasculitis. Severe diffuse atherosclerosis with high-grade stenosis at the origin. RIGHT Lower Extremity Inflow: There is severe diffuse atherosclerosis involving the right common, internal, and external iliac arteries. No focal high-grade stenosis, aneurysm, dissection, or vasculitis. Outflow: Heavily calcified atheromatous plaque severely limits evaluation of the right common femoral, superficial femoral, profundus femoral, and popliteal artery. There is at least segmental occlusion of the proximal aspect of the superficial femoral artery. Evaluation of the profundus femoral and popliteal arteries is nondiagnostic given diminutive caliber and extensive calcified atheromatous plaque. Runoff: Evaluation is limited due to heavily calcified atheromatous plaque throughout the trifurcation vessels. The anterior tibial artery is patent through the level of the ankle. The peroneal artery is diminutive but patent at its origin. It is not visualized beyond the level of the proximal calf. The posterior tibial artery is heavily calcified, but appears to enhance through the level of the ankle. LEFT Lower Extremity Inflow: There is severe diffuse atherosclerosis involving the left common, internal, and external iliac arteries. No focal stenosis, aneurysm, dissection, or vasculitis. Outflow: Heavily calcified atheromatous plaque severely limits evaluation of the left common femoral, superficial femoral, profundus femoral, and popliteal artery. There is diffuse high-grade stenosis of the common femoral artery. Multifocal long segment occlusions of the left superficial femoral artery noted. Evaluation of the profundus femoral artery and popliteal artery are limited due to heavily calcified plaque and diminutive nature of the vessels. Minimal central enhancement can be seen within the left popliteal artery. Runoff: Evaluation is limited due to heavy calcified  atheromatous plaque throughout the trifurcation vessels. The left anterior tibial artery appears patent at its origin through the level of the ankle. While there is diffuse atheromatous plaque throughout the posterior tibial artery, at least a small amount of central enhancement can be seen through the level of the ankle. Peroneal artery is diminutive, only opacified in its proximal extent to the level of the proximal calf. Veins: No obvious venous abnormality within the limitations of this arterial phase study. Review of the MIP images confirms the above findings. NON-VASCULAR Lower chest: Trace bilateral pleural effusions. Scattered emphysematous changes at the lung bases. Patchy consolidation within the lingula. This could reflect compressive atelectasis given adjacent cardiomegaly. Diffuse atherosclerosis of the coronary vessels. Hepatobiliary: No focal liver abnormality is seen. No gallstones, gallbladder wall thickening, or biliary dilatation. Pancreas: Unremarkable. No pancreatic ductal dilatation or surrounding inflammatory changes. Spleen: Normal in size without focal abnormality. Adrenals/Urinary Tract: Bilateral renal cortical atrophy consistent with history of end-stage renal disease. Mild adrenal thickening without focal lesion. Bladder is decompressed, limiting its evaluation. Stomach/Bowel: No bowel obstruction or ileus. There is diffuse colonic diverticulosis. Segmental wall thickening of the mid sigmoid colon is identified, in a region where previous diverticulitis was identified. Focal contained perforation is seen measuring 3.8 x 2.4 cm, interposed between the sigmoid colon and uterus on image 129/6. Overall, favor sequela of previous bouts of diverticulitis without evidence of acute inflammatory change. Colonoscopy may be useful to exclude underlying neoplasm if not recently performed. Normal appendix right lower quadrant. Lymphatic: No pathologic adenopathy within the abdomen or pelvis.  Reproductive: Uterus is atrophic.  No adnexal masses. Other: No free fluid or free intraperitoneal gas. No abdominal wall hernia. Musculoskeletal: There are no acute or destructive bony lesions. Bilateral  hip osteoarthrit

## 2021-11-23 NOTE — Progress Notes (Signed)
Initial Nutrition Assessment ? ?DOCUMENTATION CODES:  ?Not applicable ? ?INTERVENTION:  ?After procedure recommend a regular diet ?Renavite daily ?Nepro Shake po BID, each supplement provides 425 kcal and 19 grams protein ? ?NUTRITION DIAGNOSIS:  ?Increased nutrient needs related to chronic illness (ESRD on HD) as evidenced by estimated needs. ? ?GOAL:  ?Patient will meet greater than or equal to 90% of their needs ? ?MONITOR:  ?PO intake, Supplement acceptance, Weight trends, Diet advancement ? ?REASON FOR ASSESSMENT:  ?Malnutrition Screening Tool ?  ? ?ASSESSMENT:  ?66 year old pt with hx of DM type 2, HTN, HLD, ESRD on HD, CHF, COPD, hx CVA, and tobacco use sent to ED from PCP office with concerns for limb ischemia to the bilateral feet. Pt reports ongoing pain acutely worsening over the last few weeks. Toes discolored and cool to the touch.  ? ?Vascular surgery consulting. ABIs obtained 3/15 and showed severe bilateral PAD. Pt out of room for bilateral lower extremity angiogram at the time of assessment. Will follow-up for nutrition assessment and physical exam. ? ?Reviewed wt hx, appears to be stable (EDW 75kg per nephrology) and intake that has been recorded appears adequate.   ? ?Average Meal Intake: ?3/15-3/17: 88% intake x 2 recorded meals ? ?Nutritionally Relevant Medications: ?Scheduled Meds: ? atorvastatin  40 mg Oral q1800  ? calcium acetate  2,001 mg Oral TID WC  ? doxercalciferol  1 mcg Intravenous Q M,W,F-HD  ? insulin aspart  0-6 Units Subcutaneous TID WC  ? ?PRN Meds: ondansetron, senna-docusate ? ?Labs Reviewed: ?Sodium 134, chloride 94 ?BUN 56, creatinine 7.0 ?CBG ranges from 107-127 mg/dL over the last 24 hours  ?HgbA1c 6.3% (3/16) ? ?NUTRITION - FOCUSED PHYSICAL EXAM: ?Defer to follow-up assessment ? ?Diet Order:   ?Diet Order   ? ?       ?  Diet renal/carb modified with fluid restriction Diet-HS Snack? Nothing; Fluid restriction: 1200 mL Fluid; Room service appropriate? Yes; Fluid consistency:  Thin  Diet effective now       ?  ? ?  ?  ? ?  ? ?EDUCATION NEEDS:  ?No education needs have been identified at this time ? ?Skin:  Skin Assessment: Reviewed RN Assessment ? ?Last BM:  3/16 ? ?Height:  ?Ht Readings from Last 1 Encounters:  ?11/22/21 5\' 2"  (1.575 m)  ? ?Weight:  ?Wt Readings from Last 1 Encounters:  ?11/23/21 76.8 kg  ? ?Ideal Body Weight:  50 kg ? ?BMI:  Body mass index is 30.97 kg/m?. ? ?Estimated Nutritional Needs:  ?Kcal:  1800-2000 kcal/d ?Protein:  90-100 g/d ?Fluid:  1L + UOP ? ? ?Ranell Patrick, RD, LDN ?Clinical Dietitian ?RD pager # available in [place]  ?After hours/weekend pager # available in [place] ?

## 2021-11-23 NOTE — Progress Notes (Signed)
ANTICOAGULATION CONSULT NOTE - Follow Up Consult ? ?Pharmacy Consult for heparin ?Indication:  limb ischemia ? ?Allergies  ?Allergen Reactions  ? Codeine Nausea And Vomiting  ? Adhesive [Tape] Other (See Comments)  ?  Tape breaks out the skin if it is left on for a lengthy period of time  ? ? ?Patient Measurements: ?Height: 5\' 2"  (157.5 cm) ?Weight: 76.8 kg (169 lb 4.8 oz) ?IBW/kg (Calculated) : 50.1 ?Heparin Dosing Weight: 67 kg ? ?Vital Signs: ?Temp: 98.4 ?F (36.9 ?C) (03/17 0544) ?Temp Source: Oral (03/17 0544) ?BP: 145/52 (03/17 0544) ?Pulse Rate: 62 (03/17 0544) ? ?Labs: ?Recent Labs  ?  11/21/21 ?1842 11/21/21 ?1930 11/21/21 ?2010 11/22/21 ?0336 11/22/21 ?1308 11/22/21 ?2125 11/23/21 ?4496 11/23/21 ?7591  ?HGB 12.4  --   --  10.8*  --   --  11.2*  --   ?HCT 40.5  --   --  36.9  --   --  37.3  --   ?PLT 232  --   --  200  --   --  219  --   ?APTT  --   --  39*  --   --   --   --   --   ?LABPROT  --   --  14.7  --   --   --   --   --   ?INR  --   --  1.2  --   --   --   --   --   ?HEPARINUNFRC  --    < >  --  <0.10* <0.10* <0.10*  --  0.11*  ?CREATININE 4.14*  --   --  4.70*  --   --  7.00*  --   ? < > = values in this interval not displayed.  ? ? ? ?Estimated Creatinine Clearance: 7.8 mL/min (A) (by C-G formula based on SCr of 7 mg/dL (H)). ? ?Assessment: ?Patient presents after intermittent pain in her foot for 3-4 weeks with redness bilaterally. Patient reports bleeding during every dialysis session where her fistula must be clamped for 30 minutes after dialysis ends. Patient is not on anticoagulation PTA per chart review and patient report. Pharmacy consulted to dose heparin. ? ?Heparin level is subtherapeutic this AM; no issues with IV infusion after IV line replaced yesterday.  Collecting levels with peripheral stick. ? ?Goal of Therapy:  ?Heparin level 0.3-0.7 units/ml ?Monitor platelets by anticoagulation protocol: Yes ?  ?Plan:  ?Increase heparin infusion to 1750 units/hr ?Check anti-Xa level in 8 hours  and daily while on heparin ?Continue to monitor H&H and platelets ? ?[place] Crane, Pharm D, BCPS, BCCP ?Clinical Pharmacist ? 11/23/2021 8:34 AM  ? ?[facility] pharmacy phone numbers are listed on [email or web address] ? ? ? ?

## 2021-11-23 NOTE — Progress Notes (Addendum)
?  Site area: Right groin a 6 french long arterial sheath was removed ? ?Site Prior to Removal:  Level 0 ? ?Pressure Applied For 30 MINUTES   ? ?Bedrest Beginning at 1645pm X4 hours ? ?Manual:   Yes.   ? ?Patient Status During Pull:  stable ? ?Post Pull Groin Site:  Level 0 ? ?Post Pull Instructions Given:  Yes.   ? ?Post Pull Pulses Present:  Yes.   ? ?Dressing Applied:  Yes.   ? ?Comments:  Pain med effective pain from a 7/10 to 3/10 in right foot.  Hydralazine 5mg  X 2 given for BP  effectve from 207/59 to 170/52 ?

## 2021-11-23 NOTE — Progress Notes (Signed)
?PROGRESS NOTE ? ?AFTYN NOTT  PIR:[id number] DOB: [birth year] DOA: 11/21/2021 ?PCP: Charlott Rakes, MD  ? ?Brief Narrative: ?Tina Gomez is a 66 y.o. female with medical history significant for ESRD on MWFSa HD, COPD, chronic respiratory failure with hypoxia on 3-4 L supplemental O2 via [place], history of CVA, CAD, HFrEF (EF 30% 11/08/2020), diet-controlled T2DM, HTN, HLD, depression/anxiety, tobacco use who is admitted with bilateral lower limb ischemia.  Started on IV heparin and seen by vascular surgery.  Plan for aortogram with right leg runoff and possible intervention on 3/17  ? ?Assessment & Plan: ? ?Principal Problem: ?  Critical limb ischemia of both lower extremities ([place]) ?Active Problems: ?  ESRD on hemodialysis ([place]) ?  Chronic systolic CHF (congestive heart failure) ([place]) ?  Chronic respiratory failure with hypoxia (H[facility]) ?  COPD (chronic obstructive pulmonary disease) ([place]) ?  Essential hypertension, benign ?  Type 2 diabetes mellitus with diabetic neuropathy, unspecified ([place]) ?  CAD (coronary artery disease) ?  Hyperlipidemia ?  Anxiety and depression ? ? ?Assessment and Plan: ?* Critical limb ischemia of both lower extremities ([place]) ?Seen by vascular surgery, felt to have bilateral lower extremity critical limb ischemia.  CT angiogram showed heavily calcified atheromatous plaque limiting evaluation of aortoiliac system and runoff vessels.  Multifocal segmental occlusions of the bilateral superficial femoral arteries and popliteal arteries as well as extensive small vessel disease throughout the runoff vessels bilaterally noted. ?-Continue IV heparin ?-Continue aspirin and atorvastatin ?-Vascular surgery planning  for aortogram with right leg runoff and possible intervention on 3/17 ? ?ESRD on hemodialysis ([place]) ?Dialyzes MWFSAT, completed routine HD 3/15. ?-Nephro following for dialysis ? ?Chronic systolic CHF (congestive heart failure) ([place]) ?Stable without evidence of volume overload.  Last EF 30%  11/08/2020.  Continue Coreg.  Volume managed with dialysis. ? ?COPD (chronic obstructive pulmonary disease) ([place]) ?Chronic respiratory failure with hypoxia ?Stable on home 3-4 L O2 via [place] without evidence of acute exacerbation. ?-Continue Spiriva and albuterol as needed ? ?Essential hypertension, benign ?Resume home Coreg and losartan. ? ?Type 2 diabetes mellitus with diabetic neuropathy, unspecified ([place]) ?Diet controlled.  Placed on very sensitive SSI while in hospital. ? ?CAD (coronary artery disease) ?Stable, denies any chest pain.  Continue aspirin and statin. ? ?Hyperlipidemia ?Continue atorvastatin. ? ?Anxiety and depression ?Continue Prozac and hydroxyzine as needed. ? ? ? ? ?  ? ? ?  ?  ? ?DVT prophylaxis:Heparin iv ? ? ?  Code Status: DNR ? ?Family Communication: Daughter at bedside ? ?Patient status:inpatient ? ?Patient is from :home ? ?Anticipated discharge YT:KZSW ? ?Estimated DC date: After vascular surgery clearance ? ? ?Consultants: Vascular surgery, nephrology ? ?Procedures: None yet ? ?Antimicrobials:  ?Anti-infectives (From admission, onward)  ? ? None  ? ?  ? ? ?Subjective: ?Patient seen and examined at the bedside this morning.  Hemodynamically stable.  Waiting for vascular surgery intervention.  Denies any pain on the foot.  Color on the bilateral feet appears better ? ?Objective: ?Vitals:  ? 11/22/21 1958 11/22/21 2115 11/23/21 0544 11/23/21 0834  ?BP: (!) 151/61  (!) 145/52 (!) 155/65  ?Pulse: 64 65 62 62  ?Resp:    16  ?Temp: 98.4 ?F (36.9 ?C)  98.4 ?F (36.9 ?C) 97.7 ?F (36.5 ?C)  ?TempSrc: Oral  Oral Oral  ?SpO2: 96%  96% 96%  ?Weight:   76.8 kg   ?Height:      ? ? ?Intake/Output Summary (Last 24 hours) at 11/23/2021 1138 ?Last data filed at  11/23/2021 0839 ?Gross per 24 hour  ?Intake 390 ml  ?Output --  ?Net 390 ml  ? ?Filed Weights  ? 11/21/21 1702 11/22/21 0216 11/23/21 0544  ?Weight: 77.1 kg 76.2 kg 76.8 kg  ? ? ?Examination: ? ?General exam: Overall comfortable, not in distress,  deconditioned, chronically ill looking ?HEENT: PERRL ?Respiratory system:  no wheezes or crackles  ?Cardiovascular system: S1 & S2 heard, RRR.  ?Gastrointestinal system: Abdomen is nondistended, soft and nontender. ?Central nervous system: Alert and oriented ?Extremities: No edema, no clubbing ,no cyanosis, AV fistula on the left upper extremity, peripheral pulses on bilateral lower extremity currently felt but feet are warm with good color ?Skin: No rashes, no ulcers,no icterus   ? ? ?Data Reviewed: I have personally reviewed following labs and imaging studies ? ?CBC: ?Recent Labs  ?Lab 11/21/21 ?1842 11/22/21 ?3546 11/23/21 ?0552  ?WBC 6.5 6.2 6.8  ?NEUTROABS 4.2  --   --   ?HGB 12.4 10.8* 11.2*  ?HCT 40.5 36.9 37.3  ?MCV 90.4 90.9 89.9  ?PLT 232 200 219  ? ?Basic Metabolic Panel: ?Recent Labs  ?Lab 11/21/21 ?1842 11/22/21 ?5681 11/23/21 ?0552  ?NA 136 137 134*  ?K 3.4* 3.4* 4.7  ?CL 95* 94* 94*  ?CO2 27 26 25   ?GLUCOSE 97 141* 113*  ?BUN 29* 34* 56*  ?CREATININE 4.14* 4.70* 7.00*  ?CALCIUM 9.0 8.7* 9.0  ? ? ? ?Recent Results (from the past 240 hour(s))  ?Resp Panel by RT-PCR (Flu A&B, Covid) Nasopharyngeal Swab     Status: None  ? Collection Time: 11/21/21 10:41 PM  ? Specimen: Nasopharyngeal Swab; Nasopharyngeal(NP) swabs in vial transport medium  ?Result Value Ref Range Status  ? SARS Coronavirus 2 by RT PCR NEGATIVE NEGATIVE Final  ?  Comment: (NOTE) ?SARS-CoV-2 target nucleic acids are NOT DETECTED. ? ?The SARS-CoV-2 RNA is generally detectable in upper respiratory ?specimens during the acute phase of infection. The lowest ?concentration of SARS-CoV-2 viral copies this assay can detect is ?138 copies/mL. A negative result does not preclude SARS-Cov-2 ?infection and should not be used as the sole basis for treatment or ?other patient management decisions. A negative result may occur with  ?improper specimen collection/handling, submission of specimen other ?than nasopharyngeal swab, presence of viral  mutation(s) within the ?areas targeted by this assay, and inadequate number of viral ?copies(<138 copies/mL). A negative result must be combined with ?clinical observations, patient history, and epidemiological ?information. The expected result is Negative. ? ?Fact Sheet for Patients:  ?EntrepreneurPulse.com.au ? ?Fact Sheet for Healthcare Providers:  ?IncredibleEmployment.be ? ?This test is no t yet approved or cleared by the [place] FDA and  ?has been authorized for detection and/or diagnosis of SARS-CoV-2 by ?FDA under an Emergency Use Authorization (EUA). This EUA will remain  ?in effect (meaning this test can be used) for the duration of the ?COVID-19 declaration under Section 564(b)(1) of the Act, 21 ?U.S.C.section 360bbb-3(b)(1), unless the authorization is terminated  ?or revoked sooner.  ? ? ?  ? Influenza A by PCR NEGATIVE NEGATIVE Final  ? Influenza B by PCR NEGATIVE NEGATIVE Final  ?  Comment: (NOTE) ?The Xpert Xpress SARS-CoV-2/FLU/RSV plus assay is intended as an aid ?in the diagnosis of influenza from Nasopharyngeal swab specimens and ?should not be used as a sole basis for treatment. Nasal washings and ?aspirates are unacceptable for Xpert Xpress SARS-CoV-2/FLU/RSV ?testing. ? ?Fact Sheet for Patients: ?EntrepreneurPulse.com.au ? ?Fact Sheet for Healthcare Providers: ?IncredibleEmployment.be ? ?This test is not yet approved or cleared by  the [employer] and ?has been authorized for detection and/or diagnosis of SARS-CoV-2 by ?FDA under an Emergency Use Authorization (EUA). This EUA will remain ?in effect (meaning this test can be used) for the duration of the ?COVID-19 declaration under Section 564(b)(1) of the Act, 21 U.S.C. ?section 360bbb-3(b)(1), unless the authorization is terminated or ?revoked. ? ?Performed at [facility], [place] [address] [place], [place] ?[address] ?  ?  ? ?Radiology Studies: ?CT Angio  Aortobifemoral W and/or Wo Contrast ? ?Result Date: 11/21/2021 ?CLINICAL DATA:  Bilateral foot pain for several weeks, right greater than left EXAM: CT ANGIOGRAPHY OF ABDOMINAL AORTA WITH ILIOFEMORAL RUNOFF TECHNIQUE: Mult

## 2021-11-23 NOTE — Progress Notes (Signed)
[facility] [place] ?PROGRESS NOTE ? ?ASSESSMENT / PLAN: ?Tina Gomez is a 66 y.o. female with atherosclerosis of native arteries of bilateral lower extremities causing ischemic rest pain. ? ?Recommend the following which can slow the progression of atherosclerosis and reduce the risk of major adverse cardiac / limb events:  ?Complete cessation from all tobacco products. ?Blood glucose control with goal A1c < 7%. ?Blood pressure control with goal blood pressure < 140/90 mmHg. ?Lipid reduction therapy with goal LDL-C <100 mg/dL (<70 if symptomatic from PAD).  ?Aspirin 81mg  PO QD.  ?Atorvastatin 40-80mg  PO QD (or other "high intensity" statin therapy). ? ?Plan aortogram, bilateral lower extremity angiogram with possible intervention via right common femoral approach in cath lab today.  ? ? ?SUBJECTIVE: ?No questions. Reviewed plan for cath lab today. ? ?OBJECTIVE: ?BP (!) 155/65 (BP Location: Right Arm)   Pulse 62   Temp 97.7 ?F (36.5 ?C) (Oral)   Resp 16   Ht 5\' 2"  (1.575 m)   Wt 76.8 kg   LMP  (LMP Unknown)   SpO2 96%   BMI 30.97 kg/m?  ? ?Intake/Output Summary (Last 24 hours) at 11/23/2021 1303 ?Last data filed at 11/23/2021 7628 ?Gross per 24 hour  ?Intake 390 ml  ?Output --  ?Net 390 ml  ?  ?Constitutional: chronically ill appearing. no acute distress. ?Cardiac: RRR. ?Pulmonary: unlabored ?Abdomen: soft ?Vascular: no pedal pulses; small right foot ulcer ? ?CBC Latest Ref Rng & Units 11/23/2021 11/22/2021 11/21/2021  ?WBC 4.0 - 10.5 K/uL 6.8 6.2 6.5  ?Hemoglobin 12.0 - 15.0 g/dL 11.2(L) 10.8(L) 12.4  ?Hematocrit 36.0 - 46.0 % 37.3 36.9 40.5  ?Platelets 150 - 400 K/uL 219 200 232  ?  ? ?CMP Latest Ref Rng & Units 11/23/2021 11/22/2021 11/21/2021  ?Glucose 70 - 99 mg/dL 113(H) 141(H) 97  ?BUN 8 - 23 mg/dL 56(H) 34(H) 29(H)  ?Creatinine 0.44 - 1.00 mg/dL 7.00(H) 4.70(H) 4.14(H)  ?Sodium 135 - 145 mmol/L 134(L) 137 136  ?Potassium 3.5 - 5.1 mmol/L 4.7 3.4(L) 3.4(L)  ?Chloride 98 - 111 mmol/L  94(L) 94(L) 95(L)  ?CO2 22 - 32 mmol/L 25 26 27   ?Calcium 8.9 - 10.3 mg/dL 9.0 8.7(L) 9.0  ?Total Protein 6.5 - 8.1 g/dL - - 7.1  ?Total Bilirubin 0.3 - 1.2 mg/dL - - 0.7  ?Alkaline Phos 38 - 126 U/L - - 73  ?AST 15 - 41 U/L - - 18  ?ALT 0 - 44 U/L - - 10  ? ? ?Estimated Creatinine Clearance: 7.8 mL/min (A) (by C-G formula based on SCr of 7 mg/dL (H)). ? ?[name]. Stanford Breed, MD ?[facility] [place] ?Office Phone Number: [phone] ?11/23/2021 1:03 PM ? ? ? ?

## 2021-11-24 ENCOUNTER — Inpatient Hospital Stay (HOSPITAL_COMMUNITY): Payer: Medicare Other

## 2021-11-24 ENCOUNTER — Encounter (HOSPITAL_COMMUNITY): Payer: Medicare Other

## 2021-11-24 DIAGNOSIS — Z01810 Encounter for preprocedural cardiovascular examination: Secondary | ICD-10-CM

## 2021-11-24 DIAGNOSIS — I70223 Atherosclerosis of native arteries of extremities with rest pain, bilateral legs: Secondary | ICD-10-CM | POA: Diagnosis not present

## 2021-11-24 DIAGNOSIS — I5030 Unspecified diastolic (congestive) heart failure: Secondary | ICD-10-CM

## 2021-11-24 LAB — GLUCOSE, CAPILLARY
Glucose-Capillary: 91 mg/dL (ref 70–99)
Glucose-Capillary: 97 mg/dL (ref 70–99)
Glucose-Capillary: 98 mg/dL (ref 70–99)
Glucose-Capillary: 203 mg/dL — ABNORMAL HIGH (ref 70–99)

## 2021-11-24 LAB — ECHOCARDIOGRAM COMPLETE
AR max vel: 2.64 cm2
AV Area VTI: 2.54 cm2
AV Area mean vel: 2.45 cm2
AV Mean grad: 4 mmHg
AV Peak grad: 8.1 mmHg
Ao pk vel: 1.42 m/s
Area-P 1/2: 3.08 cm2
Calc EF: 39.5 %
Height: 62 in
S' Lateral: 3.9 cm
Single Plane A2C EF: 39.8 %
Single Plane A4C EF: 44.4 %
Weight: 2751.34 oz

## 2021-11-24 LAB — RENAL FUNCTION PANEL
Albumin: 2.8 g/dL — ABNORMAL LOW (ref 3.5–5.0)
Anion gap: 16 — ABNORMAL HIGH (ref 5–15)
BUN: 65 mg/dL — ABNORMAL HIGH (ref 8–23)
CO2: 24 mmol/L (ref 22–32)
Calcium: 9 mg/dL (ref 8.9–10.3)
Chloride: 94 mmol/L — ABNORMAL LOW (ref 98–111)
Creatinine, Ser: 8.09 mg/dL — ABNORMAL HIGH (ref 0.44–1.00)
GFR, Estimated: 5 mL/min — ABNORMAL LOW (ref >60)
Glucose, Bld: 87 mg/dL (ref 70–99)
Phosphorus: 9.4 mg/dL — ABNORMAL HIGH (ref 2.5–4.6)
Potassium: 4.8 mmol/L (ref 3.5–5.1)
Sodium: 134 mmol/L — ABNORMAL LOW (ref 135–145)

## 2021-11-24 LAB — CBC
HCT: 34.3 % — ABNORMAL LOW (ref 36.0–46.0)
Hemoglobin: 10.2 g/dL — ABNORMAL LOW (ref 12.0–15.0)
MCH: 26.8 pg (ref 26.0–34.0)
MCHC: 29.7 g/dL — ABNORMAL LOW (ref 30.0–36.0)
MCV: 90.3 fL (ref 80.0–100.0)
Platelets: 234 10*3/uL (ref 150–400)
RBC: 3.8 MIL/uL — ABNORMAL LOW (ref 3.87–5.11)
RDW: 17 % — ABNORMAL HIGH (ref 11.5–15.5)
WBC: 7.1 10*3/uL (ref 4.0–10.5)
nRBC: 0 % (ref 0.0–0.2)

## 2021-11-24 LAB — LIPID PANEL
Cholesterol: 166 mg/dL (ref 0–200)
HDL: 33 mg/dL — ABNORMAL LOW (ref >40)
LDL Cholesterol: 102 mg/dL — ABNORMAL HIGH (ref 0–99)
Total CHOL/HDL Ratio: 5 RATIO
Triglycerides: 157 mg/dL — ABNORMAL HIGH (ref <150)
VLDL: 31 mg/dL (ref 0–40)

## 2021-11-24 MED ORDER — ATORVASTATIN CALCIUM 80 MG PO TABS
80.0000 mg | ORAL_TABLET | Freq: Every day | ORAL | Status: DC
Start: 2021-11-25 — End: 2021-12-02
  Administered 2021-11-25 – 2021-11-30 (×4): 80 mg via ORAL
  Filled 2021-11-24 (×5): qty 1

## 2021-11-24 MED ORDER — HYDROMORPHONE HCL 1 MG/ML IJ SOLN
0.5000 mg | INTRAMUSCULAR | Status: DC | PRN
  Administered 2021-11-24 – 2021-11-26 (×7): 0.5 mg via INTRAVENOUS
  Filled 2021-11-24 (×7): qty 1

## 2021-11-24 MED ORDER — HYDROMORPHONE HCL 1 MG/ML IJ SOLN
INTRAMUSCULAR | Status: AC
  Administered 2021-11-24: 0.5 mg via INTRAVENOUS
  Filled 2021-11-24: qty 0.5

## 2021-11-24 MED ORDER — HYDROMORPHONE HCL 1 MG/ML IJ SOLN
1.0000 mg | Freq: Once | INTRAMUSCULAR | Status: DC
  Administered 2021-11-24: 1 mg via INTRAVENOUS

## 2021-11-24 MED ORDER — HYDROMORPHONE HCL 1 MG/ML IJ SOLN
0.5000 mg | Freq: Once | INTRAMUSCULAR | Status: AC
Start: 2021-11-24 — End: 2021-11-24

## 2021-11-24 MED ORDER — SUCROFERRIC OXYHYDROXIDE 500 MG PO CHEW
500.0000 mg | CHEWABLE_TABLET | Freq: Three times a day (TID) | ORAL | Status: DC
  Administered 2021-11-24 – 2021-11-27 (×9): 500 mg via ORAL
  Filled 2021-11-24 (×21): qty 1

## 2021-11-24 NOTE — Progress Notes (Signed)
VASCULAR LAB ? ? ? ?Lower extremity vein mapping has been performed. ? ?See CV proc for preliminary results. ? ? ?Mystic Labo, RVT ?11/24/2021, 5:41 PM ? ?

## 2021-11-24 NOTE — Progress Notes (Signed)
[facility] [place] ?PROGRESS NOTE ? ?ASSESSMENT / PLAN: ?Tina Gomez is a 66 y.o. female with ischemic rest pain of bilateral lower extremities. Unable to cross SFA lesions endovascularly. Will need bilateral, staged femoral endarterectomies, and femoral-popliteal bypass. She is not a great surgical candidate, but she does have chronic limb threatening ischemia. Will order echo, EKG today. Will ask cardiology to evaluate for preoperative risk stratification.   ? ?SUBJECTIVE: ?Severe pain in bilateral feet continues. ? ?OBJECTIVE: ?BP (!) 156/47 (BP Location: Right Arm)   Pulse 60   Temp 97.8 ?F (36.6 ?C) (Axillary)   Resp 15   Ht 5\' 2"  (1.575 m)   Wt 78 kg   LMP  (LMP Unknown)   SpO2 96%   BMI 31.45 kg/m?  ? ?Intake/Output Summary (Last 24 hours) at 11/24/2021 1146 ?Last data filed at 11/24/2021 1052 ?Gross per 24 hour  ?Intake 240 ml  ?Output 700 ml  ?Net -460 ml  ?  ?No distress. ?Regular rate and rhythm ?Unlabored breathing ?On HD ?No hematoma in R groin ? ?CBC Latest Ref Rng & Units 11/24/2021 11/23/2021 11/22/2021  ?WBC 4.0 - 10.5 K/uL 7.1 6.8 6.2  ?Hemoglobin 12.0 - 15.0 g/dL 10.2(L) 11.2(L) 10.8(L)  ?Hematocrit 36.0 - 46.0 % 34.3(L) 37.3 36.9  ?Platelets 150 - 400 K/uL 234 219 200  ?  ? ?CMP Latest Ref Rng & Units 11/24/2021 11/23/2021 11/23/2021  ?Glucose 70 - 99 mg/dL 87 - 113(H)  ?BUN 8 - 23 mg/dL 65(H) - 56(H)  ?Creatinine 0.44 - 1.00 mg/dL 8.09(H) 7.69(H) 7.00(H)  ?Sodium 135 - 145 mmol/L 134(L) - 134(L)  ?Potassium 3.5 - 5.1 mmol/L 4.8 - 4.7  ?Chloride 98 - 111 mmol/L 94(L) - 94(L)  ?CO2 22 - 32 mmol/L 24 - 25  ?Calcium 8.9 - 10.3 mg/dL 9.0 - 9.0  ?Total Protein 6.5 - 8.1 g/dL - - -  ?Total Bilirubin 0.3 - 1.2 mg/dL - - -  ?Alkaline Phos 38 - 126 U/L - - -  ?AST 15 - 41 U/L - - -  ?ALT 0 - 44 U/L - - -  ? ? ?Estimated Creatinine Clearance: 6.8 mL/min (A) (by C-G formula based on SCr of 8.09 mg/dL (H)). ? ?Tina Aline. Stanford Breed, MD ?[facility]  [place] ?Office Phone Number: [phone] ?11/24/2021 11:46 AM ? ? ? ?

## 2021-11-24 NOTE — Progress Notes (Signed)
PHARMACIST LIPID MONITORING ? ?Tina Gomez is a 66 y.o. female admitted on 11/21/2021 with critical limb ischemia of both lower extremities.  Pharmacy has been consulted to optimize lipid-lowering therapy with the indication of secondary prevention for clinical ASCVD. ? ?Recent Labs: ? ?Lipid Panel (last 6 months):   ?Lab Results  ?Component Value Date  ? CHOL 166 11/24/2021  ? TRIG 157 (H) 11/24/2021  ? HDL 33 (L) 11/24/2021  ? CHOLHDL 5.0 11/24/2021  ? VLDL 31 11/24/2021  ? LDLCALC 102 (H) 11/24/2021  ? ? ?Hepatic function panel (last 6 months):   ?Lab Results  ?Component Value Date  ? AST 18 11/21/2021  ? ALT 10 11/21/2021  ? ALKPHOS 73 11/21/2021  ? BILITOT 0.7 11/21/2021  ? ? ?SCr (since admission):   ?Serum creatinine: 8.09 mg/dL (H) 11/24/21 0318 ?Estimated creatinine clearance: 6.9 mL/min (A) ? ?Current therapy and lipid therapy tolerance ?Current lipid-lowering therapy: none ?Previous lipid-lowering therapies (if applicable): none ?Documented or reported allergies or intolerances to lipid-lowering therapies (if applicable): none ? ?Assessment:   ?Patient is excluded from the protocol due to ESRD (ESRD, elevated LFTs, pregnancy/breastfeeding, active liver disease) ? ?Plan:   ?No change at this time ? ?Levonne Spiller, PharmD ?11/24/2021, 8:42 AM  ?

## 2021-11-24 NOTE — Progress Notes (Signed)
?  Echocardiogram ?2D Echocardiogram has been performed. ? ?Tina Gomez ?11/24/2021, 1:12 PM ?

## 2021-11-24 NOTE — Progress Notes (Signed)
?  11/24/21 1052  ?Vitals  ?Temp 97.8 ?F (36.6 ?C)  ?Temp Source Oral  ?BP (!) 152/61  ?BP Location Right Arm  ?BP Method Automatic  ?Patient Position (if appropriate) Lying  ?Pulse Rate (!) 57  ?Pulse Rate Source Monitor  ?Resp 12  ?Oxygen Therapy  ?SpO2 97 %  ?O2 Device Nasal Cannula  ?O2 Flow Rate (L/min) 4 L/min  ?Pain Assessment  ?Pain Scale 0-10  ?Pain Score 0  ?Post-Hemodialysis Assessment  ?Rinseback Volume (mL) 250 mL  ?KECN 269 V  ?Dialyzer Clearance Lightly streaked  ?Duration of HD Treatment -hour(s) 3 hour(s)  ?Hemodialysis Intake (mL) 1000 mL  ?UF Total -Machine (mL) 1700 mL  ?Net UF (mL) 700 mL  ?Tolerated HD Treatment Yes ?(goal)  ?Post-Hemodialysis Comments  ?(goal not met due to clotting mid tx, and intermittent hypotension after pain med, unable to pull back additional fluid bolus)  ?Fistula / Graft Left Forearm Arteriovenous fistula  ?Placement Date/Time: 07/27/20 (c) 1340   Orientation: Left  Access Location: Forearm  Access Type: Arteriovenous fistula  ?Site Condition No complications  ?Fistula / Graft Assessment Present;[place]rill;Bruit  ?Status Deaccessed  ?Drainage Description None  ? ?HD tx complete, pt stable. Given dilaudid 0.5 mg IV x1 for c/o bilateral foot pain-effective. Intermittent hypotension noted after med administration, UF paused during those times.  ?

## 2021-11-24 NOTE — Progress Notes (Signed)
?PROGRESS NOTE ? ?Tina Gomez  HDQ:[id number] DOB: 1956/06/17 DOA: 11/21/2021 ?PCP: Charlott Rakes, MD  ? ?Brief Narrative: ?Tina Gomez is a 66 y.o. 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  Started on IV heparin and seen by vascular surgery. Aortogram performed with lower extremity runoff confirms severe PAD bilaterally. Recommendations are for staged bilateral fem-pop bypasses for critical limb ischemia.   ? ?Assessment & Plan: ? ?Principal Problem: ?  Critical limb ischemia of both lower extremities ([place]) ?Active Problems: ?  ESRD on hemodialysis ([place]) ?  Chronic systolic CHF (congestive heart failure) ([place]) ?  Chronic respiratory failure with hypoxia (H[facility]) ?  COPD (chronic obstructive pulmonary disease) ([place]) ?  Essential hypertension, benign ?  Type 2 diabetes mellitus with diabetic neuropathy, unspecified ([place]) ?  CAD (coronary artery disease) ?  Hyperlipidemia ?  Anxiety and depression ? ? ?Assessment and Plan: ?* Critical limb ischemia of both lower extremities ([place]) ?Vascular surgery, Dr. Stanford Breed, performed aortogram/bilateral lower extremity runoff 3/17 revealing severe PAD, bilateral lower extremity critical limb ischemia.  - Continue IV heparin ?- Continue aspirin and atorvastatin, since tolerating, will augment to 80mg . ?- Will need bilateral fem-pop bypass.  ?- Low dose dilaudid prn pain ? ?ESRD on hemodialysis ([place]) ?Dialyzes MWFS ?- Nephro following for dialysis ? ?Chronic systolic CHF (congestive heart failure) ([place]) ?Stable without evidence of volume overload.  Last EF 30% 11/08/2020.  ?- Continue Coreg.  ?- Volume managed with dialysis. ?- Update echo ? ?COPD (chronic obstructive pulmonary disease) ([place]) ?Quiescent ?-Continue Spiriva  and albuterol as needed ? ?Essential hypertension, benign ?Resume home Coreg and losartan. ? ?Type 2 diabetes mellitus with diabetic neuropathy, unspecified ([place]) ?Diet controlled.  Placed on very sensitive SSI while in hospital. ? ?CAD (coronary artery disease) ?Stable, denies any chest pain.  Continue aspirin and statin. ?- Cardiology to perform perioperative risk assessment. ? ?Hyperlipidemia ?LDL is 102.  ?- Augment atorvastatin. ? ?Anxiety and depression ?Continue Prozac and hydroxyzine as needed. ? ? ? ? ?  ? ? ?Nutrition Problem: Increased nutrient needs ?Etiology: chronic illness (ESRD on HD) ?  ? ?DVT prophylaxis:heparin injection 5,000 Units Start: 11/23/21 2200 ?SCD's Start: 11/23/21 1528Heparin iv ? ? ?  Code Status: DNR ? ?Family Communication: None at bedside, daughter in her room very briefly ? ?Patient status:inpatient ? ?Patient is from :home ? ?Anticipated discharge JJ:HERD ? ?Estimated DC date: After vascular surgery clearance ? ? ?Consultants: Vascular surgery, nephrology ? ?Procedures: TBD ? ?Antimicrobials:  ?Anti-infectives (From admission, onward)  ? ? None  ? ?  ? ? ?Subjective: ?Seen in HD, reporting severe pain in the soles of her feet, feeling like there are nodules and burning. Dilaudid helped. ? ?Objective: ?Vitals:  ? 11/24/21 1052 11/24/21 1113 11/24/21 1140 11/24/21 1300  ?BP: (!) 152/61  (!) 156/47 (!) 161/56  ?Pulse: (!) 57  60 63  ?Resp: 12  15 16   ?Temp: 97.8 ?F (36.6 ?C)  97.8 ?F (36.6 ?C) 97.9 ?F (36.6 ?C)  ?TempSrc: Oral  Axillary Oral  ?SpO2: 97%  96% 94%  ?Weight:  78 kg    ?Height:      ? ? ?Intake/Output Summary (Last 24 hours) at 11/24/2021 1909 ?Last data filed at 11/24/2021 1800 ?Gross per 24 hour  ?Intake 720 ml  ?  Output 700 ml  ?Net 20 ml  ? ?Filed Weights  ? 11/24/21 0425 11/24/21 0719 11/24/21 1113  ?Weight: 81 kg 80.7 kg 78 kg  ? ? ?Examination: ?Gen: 66 y.o. female in no distress ?Pulm: Nonlabored breathing room air. Clear. ?CV: Regular rate and rhythm. No murmur,  rub, or gallop. No JVD, no dependent edema. ?GI: Abdomen soft, non-tender, non-distended, with normoactive bowel sounds.  ?Ext: Warm, dry, poor peripheral pulses. Small darkened area at tip of right great toe. No visible abnormality on soles of feet. ?Skin: No new rashes, lesions or ulcers on visualized skin. ?Neuro: Alert and oriented. No focal neurological deficits. ?Psych: Judgement and insight appear fair. Mood euthymic & affect congruent. Behavior is appropriate.   ? ? ?Data Reviewed: I have personally reviewed following labs and imaging studies ? ?CBC: ?Recent Labs  ?Lab 11/21/21 ?1842 11/22/21 ?8413 11/23/21 ?2440 11/24/21 ?1027  ?WBC 6.5 6.2 6.8 7.1  ?NEUTROABS 4.2  --   --   --   ?HGB 12.4 10.8* 11.2* 10.2*  ?HCT 40.5 36.9 37.3 34.3*  ?MCV 90.4 90.9 89.9 90.3  ?PLT 232 200 219 234  ? ?Basic Metabolic Panel: ?Recent Labs  ?Lab 11/21/21 ?1842 11/22/21 ?2536 11/23/21 ?6440 11/23/21 ?1924 11/24/21 ?0318  ?NA 136 137 134*  --  134*  ?K 3.4* 3.4* 4.7  --  4.8  ?CL 95* 94* 94*  --  94*  ?CO2 27 26 25   --  24  ?GLUCOSE 97 141* 113*  --  87  ?BUN 29* 34* 56*  --  65*  ?CREATININE 4.14* 4.70* 7.00* 7.69* 8.09*  ?CALCIUM 9.0 8.7* 9.0  --  9.0  ?PHOS  --   --   --   --  9.4*  ? ? ? ?Recent Results (from the past 240 hour(s))  ?Resp Panel by RT-PCR (Flu A&B, Covid) Nasopharyngeal Swab     Status: None  ? Collection Time: 11/21/21 10:41 PM  ? Specimen: Nasopharyngeal Swab; Nasopharyngeal(NP) swabs in vial transport medium  ?Result Value Ref Range Status  ? SARS Coronavirus 2 by RT PCR NEGATIVE NEGATIVE Final  ?  Comment: (NOTE) ?SARS-CoV-2 target nucleic acids are NOT DETECTED. ? ?The SARS-CoV-2 RNA is generally detectable in upper respiratory ?specimens during the acute phase of infection. The lowest ?concentration of SARS-CoV-2 viral copies this assay can detect is ?138 copies/mL. A negative result does not preclude SARS-Cov-2 ?infection and should not be used as the sole basis for treatment or ?other patient  management decisions. A negative result may occur with  ?improper specimen collection/handling, submission of specimen other ?than nasopharyngeal swab, presence of viral mutation(s) within the ?areas targeted by this assay, and inadequate number of viral ?copies(<138 copies/mL). A negative result must be combined with ?clinical observations, patient history, and epidemiological ?information. The expected result is Negative. ? ?Fact Sheet for Patients:  ?EntrepreneurPulse.com.au ? ?Fact Sheet for Healthcare Providers:  ?IncredibleEmployment.be ? ?This test is no t yet approved or cleared by the [place] FDA and  ?has been authorized for detection and/or diagnosis of SARS-CoV-2 by ?FDA under an Emergency Use Authorization (EUA). This EUA will remain  ?in effect (meaning this test can be used) for the duration of the ?COVID-19 declaration under Section 564(b)(1) of the Act, 21 ?U.S.C.section 360bbb-3(b)(1), unless the authorization is terminated  ?or revoked sooner.  ? ? ?  ? Influenza A by PCR NEGATIVE NEGATIVE Final  ? Influenza B by PCR NEGATIVE NEGATIVE Final  ?  Comment: (NOTE) ?The Xpert Xpress  SARS-CoV-2/FLU/RSV plus assay is intended as an aid ?in the diagnosis of influenza from Nasopharyngeal swab specimens and ?should not be used as a sole basis for treatment. Nasal washings and ?aspirates are unacceptable for Xpert Xpress SARS-CoV-2/FLU/RSV ?testing. ? ?Fact Sheet for Patients: ?EntrepreneurPulse.com.au ? ?Fact Sheet for Healthcare Providers: ?IncredibleEmployment.be ? ?This test is not yet approved or cleared by the [place] FDA and ?has been authorized for detection and/or diagnosis of SARS-CoV-2 by ?FDA under an Emergency Use Authorization (EUA). This EUA will remain ?in effect (meaning this test can be used) for the duration of the ?COVID-19 declaration under Section 564(b)(1) of the Act, 21 U.S.C. ?section 360bbb-3(b)(1),  unless the authorization is terminated or ?revoked. ? ?Performed at [facility], [place] [address], [place], [place] ?[address] ?  ?  ? ?Radiology Studies: ?PERIPHERAL VASCULAR CATHETERIZATION ? ?Result Date: 3

## 2021-11-24 NOTE — Progress Notes (Signed)
?[facility] ?Progress Note  ? ?66 y.o. female ho ESRD chronic HD MWFSAT ( 4x/wk sec vol ^  cm  EF 30%) ho past uncontrolled diabetes,  CAD,HTN, gastroparesis, CVA, tobacco abuse  p/w worsened pain of bilateral feet and discolored "purplish feet " In ER admitted with with high concern for extremity limb ischemia.   ?  ?Evaluated by VVS with plans for CT angiogram IV heparin ?  ?Dialysis Orders: [facility]: [facility] MWF Saturday, 3 hours EDW 75 kg ?3K, 2.5 CA bath UF P2 ?Heparin 5000 units load, intermittent 2000  ?Hectorol 1 mic q. dialysis Venofer 50 weekly ?No current Mircera need ? ?Assessment/ Plan:   ?Bilateral lower extremity critical limb extremity ischemia= plans for PMD /VVS consulting  on IV heparin. Aortogram with b/l runoff on 3/17 -> will need b/l fem pop bypasses per Dr. Stanford Breed. ?ESRD -HD MWF Saturday, on schedule  ?Seen on dialysis  ?4K bath LUA access 114/47 ?1.5L net UF as tolerated ? ?Severe feet pain b/l -> will give dilaudid 0.5 x1.She's threatening to come off if something other than Tylenol is not given. ? ?Hypertension/volume  -no excess volume, BP labile, continue BP meds carvedilol, amlodipine, Cozaar, may be able to titrate down in hospital with HD, follow-up trend BP ?Anemia  -Hgb 11.2  no ESA needs weekly iron on HD ?Metabolic bone disease -calcium stable, continue Hectorol on HD phosphate binder with meals, follow-up calcium phosphorus 9.4 -> change to Velphoro ?History of chronic systolic CHF(last EF 36% 6/44/03= noted for 2D today, stressed patient volume control in hospital ?COPD/tobacco abuse= meds per admit on Spiriva albuterol, stressed dc tobacco ?DM type II per admit ?Nutrition -ALB 3.1 diet carb modified renal with fluid restriction renal vitamin protein supplement ?History of anxiety/depression= meds per admit ? ?Subjective:   ?Pain in feet b/l worse than yesterday but denies f/cn/v/dyspnea  ? ?Objective:   ?BP (!) 114/47 (BP Location: Right Arm)   Pulse 61    Temp 97.9 ?F (36.6 ?C) (Oral)   Resp 15   Ht 5\' 2"  (1.575 m)   Wt 80.7 kg   LMP  (LMP Unknown)   SpO2 99%   BMI 32.54 kg/m?  ? ?Intake/Output Summary (Last 24 hours) at 11/24/2021 0853 ?Last data filed at 11/24/2021 0000 ?Gross per 24 hour  ?Intake 240 ml  ?Output --  ?Net 240 ml  ? ?Weight change: 4.206 kg ? ?Physical Exam: ?General: Alert pleasant female NAD ?HEENT: [place], EOMI ?Neck: Supple no JVD ?Heart: RRR no MRG ?Lungs: Slightly decreased breath sounds bases  ?Abdomen: NABS S NT ND ?Extremities: No pedal edema ?Skin: Dry warm, right great toe tip ulcer no discharge ?Neuro: Alert .O x3 ,moves all extremities  ?Dialysis Access: LUA AVF +bruit ? ?Imaging: ?PERIPHERAL VASCULAR CATHETERIZATION ? ?Result Date: 11/23/2021 ?DATE OF SERVICE: 11/23/2021  PATIENT:  Tina Gomez  66 y.o. female  PRE-OPERATIVE DIAGNOSIS:  Atherosclerosis of native arteries of bilateral lower extremities causing ischemic rest pain  POST-OPERATIVE DIAGNOSIS:  Same  PROCEDURE:  1) US guided right common femoral access 2) Aortogram with bilateral lower extremity runoff angiogram 3) Left angiogram with third order cannulation 4) Conscious sedation (33 minutes)  SURGEON:  [name]. Stanford Breed, MD  ASSISTANT: none  ANESTHESIA:   local and IV sedation  ESTIMATED BLOOD LOSS: minimal  LOCAL MEDICATIONS USED:  LIDOCAINE  COUNTS: confirmed correct.  PATIENT DISPOSITION:  PACU - hemodynamically stable.  Delay start of Pharmacological VTE agent (>24hrs) due to surgical blood loss or  risk of bleeding: no  INDICATION FOR PROCEDURE: Tina Gomez is a 66 y.o. female with bilateral lower extremity ischemic rest pain. Preoperative CT angiogram was not diagnostic. After careful discussion of risks, benefits, and alternatives the patient was offered angiography with possible intervention. The patient  understood and wished to proceed.  OPERATIVE FINDINGS: Terminal aorta and iliac arteries: Heavily diseased, but widely patent  Right lower extremity: Common  femoral artery: 50% stenosis Profunda femoris artery: Heavily diseased, but widely patent Superficial femoral artery: Patent at its origin, but occludes. Heavily diseased. Popliteal artery: Reconstitution behind the knee Anterior tibial artery: Heavily diseased; dominant tibial Tibioperoneal trunk: heavily diseased but patent Peroneal artery: heavily diseased, occludes mid calf Posterior tibial artery: Pedal circulation: not well seen.  Left lower extremity: Common femoral artery: 70% stenosis Profunda femoris artery: Heavily diseased, but widely patent Superficial femoral artery: Patent at its origin, but occludes. Heavily diseased. Popliteal artery: Reconstitution behind the knee Anterior tibial artery: Heavily diseased; dominant tibial Tibioperoneal trunk: heavily diseased but patent Peroneal artery: heavily diseased, occludes mid calf Posterior tibial artery: Pedal circulation: not well seen.  DESCRIPTION OF PROCEDURE: After identification of the patient in the pre-operative holding area, the patient was transferred to the operating room. The patient was positioned supine on the operating room table. Anesthesia was induced. The groins was prepped and draped in standard fashion. A surgical pause was performed confirming correct patient, procedure, and operative location.  The right groin was anesthetized with subcutaneous injection of 1% lidocaine. Using ultrasound guidance, the right common femoral artery was accessed with micropuncture technique. Fluoroscopy was used to confirm cannulation over the femoral head. The 34F sheath was upsized to 92F.  A Benson wire was advanced into the distal aorta. Over the wire an omni flush catheter was advanced to the level of L2. Aortogram was performed - see above for details. Bilateral lower extremity runoff angiography was performed - see above for details.  The left common iliac artery was selected with a glidewire guidewire. The wire was advanced into the common femoral  artery. Over the wire the omni flush catheter was advanced into the external iliac artery. Selective angiography was performed - see above for details.  The decision was made to intervene. The patient was heparinized with 7000 units of heparin. The 92F sheath was exchanged for a 30F x 45cm sheath. Selective angiography of the left lower extremity was performed prior to intervention.  I tried to cross the left SFA occlusion but was not able to cross the heavily diseased lesion.  The sheath was left in place to be removed in the recovery area.  Conscious sedation was administered with the use of IV fentanyl and midazolam under continuous physician and nurse monitoring.  Heart rate, blood pressure, and oxygen saturation were continuously monitored.  Total sedation time was 33 minutes  Upon completion of the case instrument and sharps counts were confirmed correct. The patient was transferred to the PACU in good condition. I was present for all portions of the procedure.  PLAN: ASA 81mg  PO QD. High intensity statin. Needs bilateral fem pop bypasses. Will plan to do this in staged fashion. Will ask cardiology to evaluate for risk stratification.  [name]. Stanford Breed, MD [facility] Phone Number: [phone] 11/23/2021 7:00 PM   ? ?Labs: ?BMET ?Recent Labs  ?Lab 11/21/21 ?1842 11/22/21 ?7782 11/23/21 ?4235 11/23/21 ?1924 11/24/21 ?0318  ?NA 136 137 134*  --  134*  ?K 3.4* 3.4*  4.7  --  4.8  ?CL 95* 94* 94*  --  94*  ?CO2 27 26 25   --  24  ?GLUCOSE 97 141* 113*  --  87  ?BUN 29* 34* 56*  --  65*  ?CREATININE 4.14* 4.70* 7.00* 7.69* 8.09*  ?CALCIUM 9.0 8.7* 9.0  --  9.0  ?PHOS  --   --   --   --  9.4*  ? ?CBC ?Recent Labs  ?Lab 11/21/21 ?1842 11/22/21 ?4709 11/23/21 ?2957 11/24/21 ?4734  ?WBC 6.5 6.2 6.8 7.1  ?NEUTROABS 4.2  --   --   --   ?HGB 12.4 10.8* 11.2* 10.2*  ?HCT 40.5 36.9 37.3 34.3*  ?MCV 90.4 90.9 89.9 90.3  ?PLT 232 200 219 234  ? ? ?Medications:   ? ? aspirin  325 mg Oral  Daily  ? atorvastatin  40 mg Oral q1800  ? calcium acetate  2,001 mg Oral TID WC  ? carvedilol  12.5 mg Oral QHS  ? And  ? carvedilol  12.5 mg Oral Once per day on Sun Tue Thu  ? Chlorhexidine Gluconate Cloth  6 each To

## 2021-11-24 NOTE — Progress Notes (Signed)
Patient is refusing to go to dialysis at this time. Patient states " it is too late and I am too tired, they will have to take me later today". Transport arrived on unit to transfer patient and RN made him aware of patients refusal. Dialysis department did not contact this RN for report so they were unaware of patient's refusal before transport arrived on unit. RN from dialysis came to unit to speak with patient about going later this am and she is agreeable with plan at this time. Will continue to monitor.  ?

## 2021-11-25 ENCOUNTER — Encounter (HOSPITAL_COMMUNITY): Payer: Self-pay | Admitting: Internal Medicine

## 2021-11-25 DIAGNOSIS — I739 Peripheral vascular disease, unspecified: Secondary | ICD-10-CM

## 2021-11-25 DIAGNOSIS — I2510 Atherosclerotic heart disease of native coronary artery without angina pectoris: Secondary | ICD-10-CM | POA: Diagnosis not present

## 2021-11-25 DIAGNOSIS — I429 Cardiomyopathy, unspecified: Secondary | ICD-10-CM | POA: Diagnosis not present

## 2021-11-25 DIAGNOSIS — Z01810 Encounter for preprocedural cardiovascular examination: Secondary | ICD-10-CM

## 2021-11-25 DIAGNOSIS — I70223 Atherosclerosis of native arteries of extremities with rest pain, bilateral legs: Secondary | ICD-10-CM | POA: Diagnosis not present

## 2021-11-25 LAB — CBC
HCT: 32.3 % — ABNORMAL LOW (ref 36.0–46.0)
Hemoglobin: 9.6 g/dL — ABNORMAL LOW (ref 12.0–15.0)
MCH: 27.3 pg (ref 26.0–34.0)
MCHC: 29.7 g/dL — ABNORMAL LOW (ref 30.0–36.0)
MCV: 91.8 fL (ref 80.0–100.0)
Platelets: 187 K/uL (ref 150–400)
RBC: 3.52 MIL/uL — ABNORMAL LOW (ref 3.87–5.11)
RDW: 17.1 % — ABNORMAL HIGH (ref 11.5–15.5)
WBC: 6.5 K/uL (ref 4.0–10.5)
nRBC: 0 % (ref 0.0–0.2)

## 2021-11-25 LAB — GLUCOSE, CAPILLARY
Glucose-Capillary: 85 mg/dL (ref 70–99)
Glucose-Capillary: 104 mg/dL — ABNORMAL HIGH (ref 70–99)
Glucose-Capillary: 192 mg/dL — ABNORMAL HIGH (ref 70–99)
Glucose-Capillary: 156 mg/dL — ABNORMAL HIGH (ref 70–99)

## 2021-11-25 MED ORDER — HEPARIN SODIUM (PORCINE) 1000 UNIT/ML DIALYSIS
1000.0000 [IU] | INTRAMUSCULAR | Status: DC | PRN
  Filled 2021-11-25: qty 1

## 2021-11-25 MED ORDER — LIDOCAINE-PRILOCAINE 2.5-2.5 % EX CREA: 1.0000 "application " | TOPICAL_CREAM | CUTANEOUS | Status: DC | PRN

## 2021-11-25 MED ORDER — PENTAFLUOROPROP-TETRAFLUOROETH EX AERO: 1.0000 "application " | INHALATION_SPRAY | CUTANEOUS | Status: DC | PRN

## 2021-11-25 MED ORDER — ALTEPLASE 2 MG IJ SOLR: 2.0000 mg | Freq: Once | INTRAMUSCULAR | Status: DC | PRN

## 2021-11-25 MED ORDER — SODIUM CHLORIDE 0.9 % IV SOLN: 100.0000 mL | INTRAVENOUS | Status: DC | PRN

## 2021-11-25 MED ORDER — SODIUM CHLORIDE 0.9% IV SOLUTION: 100.0000 mL | INTRAVENOUS | Status: DC | PRN

## 2021-11-25 MED ORDER — LIDOCAINE HCL (PF) 1 % IJ SOLN: 5.0000 mL | INTRAMUSCULAR | Status: DC | PRN

## 2021-11-25 NOTE — Consult Note (Signed)
?Cardiology Consultation:  ? ?Patient ID: Tina Gomez ?MRN: [id number]; DOB: [birth year] ? ?Admit date: 11/21/2021 ?Date of Consult: 11/25/2021 ? ?PCP:  Charlott Rakes, MD ?  ?[facility] Providers ?Cardiologist:  Kirk Ruths, MD    ? ? ?Patient Profile:  ? ?Tina Gomez is a 66 y.o. female with a hx of end-stage renal disease dialysis dependent, COPD, chronic respiratory failure, prior CVA, CAD, PVC, cardiomyopathy out of proportion to coronary disease, diabetes mellitus, hypertension, hyperlipidemia, tobacco abuse who is being seen 11/25/2021 for preoperative evaluation prior to femoral endarterectomies and femoral-popliteal bypass at the request of Jamelle Haring MD. ? ?History of Present Illness:  ? ?Carotid Dopplers February 2015 showed 1 to 39% bilateral stenosis. Echocardiogram March 2022 showed ejection fraction 30%, mild left ventricular enlargement, mild left ventricular hypertrophy, grade 2 diastolic dysfunction, mild RV dysfunction, moderate left atrial enlargement, mild right atrial enlargement, mild mitral regurgitation.  Cardiac catheterization March 2022 showed 70% proximal right coronary artery followed by 50% with otherwise nonobstructive disease.  Cardiomyopathy felt out of proportion to coronary artery disease and she has been treated medically.  Patient also on dialysis for end-stage renal disease.  Patient has been admitted with bilateral foot pain.  She has been found to have critical limb ischemia requiring femoral endarterectomies and femoral-popliteal bypass and cardiology asked to evaluate preoperatively.  Note she is limited in activities due to bilateral lower extremity pain.  She is on home oxygen for approximately 1 year.  When using her oxygen she does not have significant dyspnea on exertion, orthopnea, PND or pedal edema.  She occasionally has "indigestion" which improves with belching but denies exertional chest pain.  Her symptoms are unchanged compared to  previous. ? ? ?Past Medical History:  ?Diagnosis Date  ? Anemia   ? CAD (coronary artery disease)   ? Chronic respiratory failure with hypoxia (HCC)   ? COPD (chronic obstructive pulmonary disease) ([place]) 05/2019  ? no inhaler  ? Diabetes mellitus without complication ([place])   ? no meds - diet controlled  ? ESRD on hemodialysis ([place])   ? History of blood transfusion 09/2020  ? 1 unit  ? HLD (hyperlipidemia)   ? Hypertension   ? Peripheral vascular disease ([place])   ? PONV (postoperative nausea and vomiting)   ? Restless legs syndrome (RLS)   ? Stroke [facility])   ? ? ?Past Surgical History:  ?Procedure Laterality Date  ? AV FISTULA PLACEMENT Left 07/27/2020  ? Procedure: LEFT BRACHIO-BASILIC ARTERIOVENOUS (AV) FISTULA CREATION;  Surgeon: Cherre Robins, MD;  Location: [facility];  Service: Vascular;  Laterality: Left;  ? BASCILIC VEIN TRANSPOSITION Left 11/02/2020  ? Procedure: LEFT UPPER ARM SECOND STAGE BASILIC VEIN TRANSPOSITION;  Surgeon: Cherre Robins, MD;  Location: [facility] OR;  Service: Vascular;  Laterality: Left;  PERIPHERAL NERVE BLOCK  ? CESAREAN SECTION    ? x 1  ? ESOPHAGOGASTRODUODENOSCOPY (EGD) WITH PROPOFOL N/A 05/26/2020  ? Procedure: ESOPHAGOGASTRODUODENOSCOPY (EGD) WITH PROPOFOL;  Surgeon: Irene Shipper, MD;  Location: [facility] ENDOSCOPY;  Service: Endoscopy;  Laterality: N/A;  ? INSERTION OF DIALYSIS CATHETER Right 07/27/2020  ? Procedure: INSERTION OF RIGHT INTERNAL JUGULAR TUNNELED DIALYSIS CATHETER;  Surgeon: Cherre Robins, MD;  Location: [facility];  Service: Vascular;  Laterality: Right;  ? IR FLUORO GUIDE CV LINE RIGHT  07/25/2020  ? IR US GUIDE VASC ACCESS RIGHT  07/25/2020  ? LEFT HEART CATH AND CORONARY ANGIOGRAPHY N/A 11/28/2020  ? Procedure: LEFT HEART CATH AND CORONARY ANGIOGRAPHY;  Surgeon: Troy Sine, MD;  Location: [facility] CV LAB;  Service: Cardiovascular;  Laterality: N/A;  ? TONSILLECTOMY    ? UPPER GI ENDOSCOPY    ? growth removed from voice box  ? WRIST SURGERY Left   ? ganglion cyst removal   ?  ? ?Inpatient Medications: ?Scheduled Meds: ? aspirin  325 mg Oral Daily  ? atorvastatin  80 mg Oral q1800  ? carvedilol  12.5 mg Oral QHS  ? And  ? carvedilol  12.5 mg Oral Once per day on Sun Tue Thu  ? Chlorhexidine Gluconate Cloth  6 each Topical Q0600  ? doxercalciferol  1 mcg Intravenous Q M,W,F-HD  ? feeding supplement (NEPRO CARB STEADY)  237 mL Oral BID BM  ? FLUoxetine  20 mg Oral QHS  ? gabapentin  200 mg Oral QHS  ? heparin  5,000 Units Subcutaneous Q8H  ? insulin aspart  0-6 Units Subcutaneous TID WC  ? losartan  100 mg Oral Daily  ? multivitamin  1 tablet Oral QHS  ? sodium chloride flush  3 mL Intravenous Q12H  ? sodium chloride flush  3 mL Intravenous Q12H  ? sucroferric oxyhydroxide  500 mg Oral TID WC  ? ?Continuous Infusions: ? sodium chloride    ? [START ON 11/28/2021] iron sucrose    ? ?PRN Meds: ?sodium chloride, acetaminophen **OR** acetaminophen, acetaminophen, albuterol, HYDROmorphone (DILAUDID) injection, hydrOXYzine, labetalol, labetalol, ondansetron **OR** ondansetron (ZOFRAN) IV, ondansetron (ZOFRAN) IV, senna-docusate, sodium chloride flush ? ?Allergies:    ?Allergies  ?Allergen Reactions  ? Umeclidinium Bromide Anaphylaxis  ? Codeine Nausea And Vomiting  ? Adhesive [Tape] Other (See Comments)  ?  Tape breaks out the skin if it is left on for a lengthy period of time  ? ? ?Social History:   ?Social History  ? ?Socioeconomic History  ? Marital status: Divorced  ?  Spouse name: Not on file  ? Number of children: Not on file  ? Years of education: Not on file  ? Highest education level: Not on file  ?Occupational History  ? Occupation: disabled  ?Tobacco Use  ? Smoking status: Every Day  ?  Packs/day: 1.00  ?  Years: 47.00  ?  Pack years: 47.00  ?  Types: Cigarettes  ? Smokeless tobacco: Never  ? Tobacco comments:  ?  5 cigs daily  ?Vaping Use  ? Vaping Use: Never used  ?Substance and Sexual Activity  ? Alcohol use: No  ? Drug use: No  ? Sexual activity: Not on file  ?Other Topics  Concern  ? Not on file  ?Social History Narrative  ? Not on file  ? ?Social Determinants of Health  ? ?Financial Resource Strain: Not on file  ?Food Insecurity: Not on file  ?Transportation Needs: Not on file  ?Physical Activity: Not on file  ?Stress: Not on file  ?Social Connections: Not on file  ?Intimate Partner Violence: Not on file  ?  ?Family History:   ? ?Family History  ?Problem Relation Age of Onset  ? Stroke Mother   ? Hypertension Mother   ? Hyperlipidemia Mother   ? Hypertension Father   ? Hyperlipidemia Father   ?  ? ?ROS:  ?Please see the history of present illness.  ?Bilateral foot pain particular with ambulation but no fevers or chills. ?All other ROS reviewed and negative.    ? ?Physical Exam/Data:  ? ?Vitals:  ? 11/24/21 2050 11/25/21 5397 11/25/21 0735 11/25/21 0904  ?BP: (!) 145/51 (!) 153/48 Marland Kitchen)  159/71 (!) 154/70  ?Pulse: 62 60 61 61  ?Resp: 16 16 17    ?Temp: 98.6 ?F (37 ?C) 98.5 ?F (36.9 ?C) 98.7 ?F (37.1 ?C)   ?TempSrc: Axillary Axillary Oral   ?SpO2: 98% 98% 100%   ?Weight:  78.3 kg    ?Height:      ? ? ?Intake/Output Summary (Last 24 hours) at 11/25/2021 1004 ?Last data filed at 11/25/2021 0900 ?Gross per 24 hour  ?Intake 580 ml  ?Output 701 ml  ?Net -121 ml  ? ?Last 3 Weights 11/25/2021 11/24/2021 11/24/2021  ?Weight (lbs) 172 lb 9.9 oz 171 lb 15.3 oz 177 lb 14.6 oz  ?Weight (kg) 78.3 kg 78 kg 80.7 kg  ?   ?Body mass index is 31.57 kg/m?.  ?General:  Well nourished, well developed, in no acute distress ?HEENT: normal ?Neck: no JVD ?Vascular: No carotid bruits; Distal pulses diminished distal pulses ?Cardiac:  normal S1, S2; RRR; no murmur  ?Lungs:  clear to auscultation bilaterally, no wheezing, rhonchi or rales  ?Abd: soft, nontender, no hepatomegaly  ?Ext: no edema; erythema digits ?Musculoskeletal:  No deformities, BUE and BLE strength normal and equal ?Skin: warm and dry  ?Neuro:  CNs 2-12 intact, no focal abnormalities noted ?Psych:  Normal affect  ? ?EKG:  The EKG October 12, 2021 was  personally reviewed and demonstrates: Normal sinus rhythm, first-degree AV block, prolonged QT interval ?Telemetry:  Telemetry was personally reviewed and demonstrates: Normal sinus rhythm. ? ?Laboratory Data: ? ? ?Chemistry ?Rec

## 2021-11-25 NOTE — Progress Notes (Signed)
?  Transition of Care (TOC) Screening Note ? ? ?Patient Details  ?Name: Tina Gomez ?Date of Birth: March 05, 1956 ? ? ?Transition of Care (TOC) CM/SW Contact:    ?Pollie Friar, RN ?Phone Number: ?11/25/2021, 10:13 AM ? ? ? ?Transition of Care Department [facility]) has reviewed patient. We will continue to monitor patient advancement through interdisciplinary progression rounds. If new patient transition needs arise, please place a TOC consult. ?  ?

## 2021-11-25 NOTE — Assessment & Plan Note (Addendum)
Full comfort care ?

## 2021-11-25 NOTE — Progress Notes (Signed)
?PROGRESS NOTE ? ?Tina Gomez  JME:[id number] DOB: 18-Apr-1956 DOA: 11/21/2021 ?PCP: Charlott Rakes, MD  ? ?Brief Narrative: ?Tina Gomez is a 66 y.o. female with medical history significant for ESRD on MWFSa HD, COPD, chronic respiratory failure with hypoxia on 3-4 L supplemental O2 via [place], history of CVA, CAD, HFrEF (EF 30% 11/08/2020), diet-controlled T2DM, HTN, HLD, depression/anxiety, tobacco use who is admitted with bilateral lower limb ischemia.  Started on IV heparin and seen by vascular surgery. Aortogram performed with lower extremity runoff confirms severe PAD bilaterally. Recommendations are for staged bilateral fem-pop bypasses for critical limb ischemia.   ? ?Assessment & Plan: ? ?Principal Problem: ?  Critical limb ischemia of both lower extremities ([place]) ?Active Problems: ?  ESRD on hemodialysis ([place]) ?  Chronic systolic CHF (congestive heart failure) ([place]) ?  Chronic respiratory failure with hypoxia (HCC) ?  COPD (chronic obstructive pulmonary disease) ([place]) ?  Essential hypertension, benign ?  Type 2 diabetes mellitus with diabetic neuropathy, unspecified ([place]) ?  CAD (coronary artery disease) ?  Hyperlipidemia ?  Anxiety and depression ? ? ?Assessment and Plan: ?* Critical limb ischemia of both lower extremities ([place]) ?Vascular surgery, Dr. Stanford Breed, performed aortogram/bilateral lower extremity runoff 3/17 revealing severe PAD, bilateral lower extremity critical limb ischemia.  - Continue IV heparin ?- Continue aspirin and atorvastatin, since tolerating, will augment to 80mg . ?- Will need bilateral fem-pop bypass.  ?- Low dose dilaudid prn pain ? ?ESRD on hemodialysis ([place]) ?Dialyzes MWFS ?- Nephro following for dialysis ? ?Chronic systolic CHF (congestive heart failure) ([place]) ?Stable without evidence of volume overload.  Last EF 30% 11/08/2020. Stable on recheck 11/24/2021. ?- Continue Coreg, losartan.  ?- Volume managed with dialysis. ? ?COPD (chronic obstructive pulmonary disease)  ([place]) ?Quiescent ?-Continue Spiriva and albuterol as needed ? ?Chronic respiratory failure with hypoxia (HCC) ?Remains at baseline. ? ?Essential hypertension, benign ?Resume home Coreg and losartan. ? ?Type 2 diabetes mellitus with diabetic neuropathy, unspecified ([place]) ?Diet controlled.  Placed on very sensitive SSI while in hospital. Remains at inpatient goal.  ? ?CAD (coronary artery disease) ?Stable, denies any chest pain. No new WMA on echo. ?- Continue aspirin, coreg, and atorvastatin. ?- Pt's cardiology, Dr. Stanford Breed, consulted for periprocedural risk assessment. ? ?Hyperlipidemia ?LDL is 102.  ?- Augment atorvastatin. ? ?Anxiety and depression ?Continue Prozac and hydroxyzine as needed. ? ? ? ? ?  ? ? ?Nutrition Problem: Increased nutrient needs ?Etiology: chronic illness (ESRD on HD) ?  ? ?DVT prophylaxis:heparin injection 5,000 Units Start: 11/23/21 2200 ?SCD's Start: 11/23/21 1528Heparin iv ? ? ?  Code Status: DNR ? ?Family Communication: None at bedside, daughter in her room very briefly ? ?Patient status:inpatient ? ?Patient is from :home ? ?Anticipated discharge IW:LNLG ? ?Estimated DC date: After vascular surgery clearance ? ? ?Consultants: Vascular surgery, nephrology ? ?Procedures: TBD ? ?Subjective: ?Patient reports significant improvement in pain overall, specifically in her feet/soles with the medication provided. No sedation reported by either patient, daughter or RN. No chest pain or dyspnea above her baseline. Remains on stable oxygen. Has questions around the bypass proposed. No recent or current symptoms of angina or CHF exacerbation (only worsens dyspnea when she misses HD) ? ?Objective: ?Vitals:  ? 11/24/21 2050 11/25/21 9211 11/25/21 0735 11/25/21 0904  ?BP: (!) 145/51 (!) 153/48 (!) 159/71 (!) 154/70  ?Pulse: 62 60 61 61  ?Resp: 16 16 17    ?Temp: 98.6 ?F (37 ?C) 98.5 ?F (36.9 ?C) 98.7 ?F (37.1 ?C)   ?TempSrc: Axillary  Axillary Oral   ?SpO2: 98% 98% 100%   ?Weight:  78.3 kg    ?Height:       ? ? ?Intake/Output Summary (Last 24 hours) at 11/25/2021 0930 ?Last data filed at 11/25/2021 0900 ?Gross per 24 hour  ?Intake 580 ml  ?Output 701 ml  ?Net -121 ml  ? ?Filed Weights  ? 11/24/21 0719 11/24/21 1113 11/25/21 0508  ?Weight: 80.7 kg 78 kg 78.3 kg  ? ? ?Examination: ?Gen: Pleasant, chronically ill-appearing female in no distress ?Pulm: Nonlabored breathing supplemental oxygen, no wheezes or crackles. ?CV: Regular rate and rhythm. No murmur, rub, or gallop. No JVD, no pitting dependent edema. ?GI: Abdomen soft, non-tender, non-distended, with normoactive bowel sounds.  ?Ext: Warm, no deformities. Warm with moderate cap refill in LE's, diminished/not definitely palpable DP pulses.  ?Skin: No new rashes, lesions or ulcers on visualized skin. Soles without gangrene ?Neuro: Alert and oriented. No focal neurological deficits. ?Psych: Judgement and insight appear fair. Mood euthymic & affect congruent. Behavior is appropriate.   ? ? ?Data Reviewed: I have personally reviewed following labs and imaging studies ? ?CBC: ?Recent Labs  ?Lab 11/21/21 ?1842 11/22/21 ?8563 11/23/21 ?1497 11/24/21 ?0263 11/25/21 ?0206  ?WBC 6.5 6.2 6.8 7.1 6.5  ?NEUTROABS 4.2  --   --   --   --   ?HGB 12.4 10.8* 11.2* 10.2* 9.6*  ?HCT 40.5 36.9 37.3 34.3* 32.3*  ?MCV 90.4 90.9 89.9 90.3 91.8  ?PLT 232 200 219 234 187  ? ?Basic Metabolic Panel: ?Recent Labs  ?Lab 11/21/21 ?1842 11/22/21 ?7858 11/23/21 ?8502 11/23/21 ?1924 11/24/21 ?0318  ?NA 136 137 134*  --  134*  ?K 3.4* 3.4* 4.7  --  4.8  ?CL 95* 94* 94*  --  94*  ?CO2 27 26 25   --  24  ?GLUCOSE 97 141* 113*  --  87  ?BUN 29* 34* 56*  --  65*  ?CREATININE 4.14* 4.70* 7.00* 7.69* 8.09*  ?CALCIUM 9.0 8.7* 9.0  --  9.0  ?PHOS  --   --   --   --  9.4*  ? ? ? ?Recent Results (from the past 240 hour(s))  ?Resp Panel by RT-PCR (Flu A&B, Covid) Nasopharyngeal Swab     Status: None  ? Collection Time: 11/21/21 10:41 PM  ? Specimen: Nasopharyngeal Swab; Nasopharyngeal(NP) swabs in vial transport  medium  ?Result Value Ref Range Status  ? SARS Coronavirus 2 by RT PCR NEGATIVE NEGATIVE Final  ?  Comment: (NOTE) ?SARS-CoV-2 target nucleic acids are NOT DETECTED. ? ?The SARS-CoV-2 RNA is generally detectable in upper respiratory ?specimens during the acute phase of infection. The lowest ?concentration of SARS-CoV-2 viral copies this assay can detect is ?138 copies/mL. A negative result does not preclude SARS-Cov-2 ?infection and should not be used as the sole basis for treatment or ?other patient management decisions. A negative result may occur with  ?improper specimen collection/handling, submission of specimen other ?than nasopharyngeal swab, presence of viral mutation(s) within the ?areas targeted by this assay, and inadequate number of viral ?copies(<138 copies/mL). A negative result must be combined with ?clinical observations, patient history, and epidemiological ?information. The expected result is Negative. ? ?Fact Sheet for Patients:  ?EntrepreneurPulse.com.au ? ?Fact Sheet for Healthcare Providers:  ?IncredibleEmployment.be ? ?This test is no t yet approved or cleared by the [place] FDA and  ?has been authorized for detection and/or diagnosis of SARS-CoV-2 by ?FDA under an Emergency Use Authorization (EUA). This EUA will remain  ?in effect (  meaning this test can be used) for the duration of the ?COVID-19 declaration under Section 564(b)(1) of the Act, 21 ?U.S.C.section 360bbb-3(b)(1), unless the authorization is terminated  ?or revoked sooner.  ? ? ?  ? Influenza A by PCR NEGATIVE NEGATIVE Final  ? Influenza B by PCR NEGATIVE NEGATIVE Final  ?  Comment: (NOTE) ?The Xpert Xpress SARS-CoV-2/FLU/RSV plus assay is intended as an aid ?in the diagnosis of influenza from Nasopharyngeal swab specimens and ?should not be used as a sole basis for treatment. Nasal washings and ?aspirates are unacceptable for Xpert Xpress SARS-CoV-2/FLU/RSV ?testing. ? ?Fact Sheet for  Patients: ?EntrepreneurPulse.com.au ? ?Fact Sheet for Healthcare Providers: ?IncredibleEmployment.be ? ?This test is not yet approved or cleared by the [place] and ?has been

## 2021-11-25 NOTE — Progress Notes (Signed)
?[facility] ?Progress Note  ? ?Subjective:    ?Seen and examined patient at bedside. Patient's daughter also at bedside. Now on Dilaudid PRN-pain improved slightly. Reviewed HD note-unable to reach UF goal d/t clotting mid-treatment and hypotension. Noted net UF 773ml. Plan for HD 3/20. ? ?Objective ?Vitals:  ? 11/24/21 2050 11/25/21 4496 11/25/21 0735 11/25/21 0904  ?BP: (!) 145/51 (!) 153/48 (!) 159/71 (!) 154/70  ?Pulse: 62 60 61 61  ?Resp: 16 16 17    ?Temp: 98.6 ?F (37 ?C) 98.5 ?F (36.9 ?C) 98.7 ?F (37.1 ?C)   ?TempSrc: Axillary Axillary Oral   ?SpO2: 98% 98% 100%   ?Weight:  78.3 kg    ?Height:      ? ?Physical Exam ?General: Awake/Alert; Chronically-ill appearing; on 4L [place]; NAD ?Heart: S1 and S2; No murmurs, gallops, or rubs ?Lungs: Diminished bilaterally ?Abdomen: Soft and non-tender ?Extremities: No edema BLLE; warm to touch ?Dialysis Access: LUE AVF (+) B/T  ? ?Filed Weights  ? 11/24/21 0719 11/24/21 1113 11/25/21 0508  ?Weight: 80.7 kg 78 kg 78.3 kg  ? ? ?Intake/Output Summary (Last 24 hours) at 11/25/2021 0923 ?Last data filed at 11/25/2021 0900 ?Gross per 24 hour  ?Intake 580 ml  ?Output 701 ml  ?Net -121 ml  ? ? ?Additional Objective ?Labs: ?Basic Metabolic Panel: ?Recent Labs  ?Lab 11/22/21 ?0336 11/23/21 ?7591 11/23/21 ?1924 11/24/21 ?0318  ?NA 137 134*  --  134*  ?K 3.4* 4.7  --  4.8  ?CL 94* 94*  --  94*  ?CO2 26 25  --  24  ?GLUCOSE 141* 113*  --  87  ?BUN 34* 56*  --  65*  ?CREATININE 4.70* 7.00* 7.69* 8.09*  ?CALCIUM 8.7* 9.0  --  9.0  ?PHOS  --   --   --  9.4*  ? ?Liver Function Tests: ?Recent Labs  ?Lab 11/21/21 ?1842 11/24/21 ?6384  ?AST 18  --   ?ALT 10  --   ?ALKPHOS 73  --   ?BILITOT 0.7  --   ?PROT 7.1  --   ?ALBUMIN 3.1* 2.8*  ? ?No results for input(s): LIPASE, AMYLASE in the last 168 hours. ?CBC: ?Recent Labs  ?Lab 11/21/21 ?1842 11/22/21 ?6659 11/23/21 ?9357 11/24/21 ?0177 11/25/21 ?0206  ?WBC 6.5 6.2 6.8 7.1 6.5  ?NEUTROABS 4.2  --   --   --   --   ?HGB 12.4 10.8* 11.2*  10.2* 9.6*  ?HCT 40.5 36.9 37.3 34.3* 32.3*  ?MCV 90.4 90.9 89.9 90.3 91.8  ?PLT 232 200 219 234 187  ? ?Blood Culture ?   ?Component Value Date/Time  ? SDES EXPECTORATED SPUTUM 09/13/2021 0459  ? SDES EXPECTORATED SPUTUM 09/13/2021 0459  ? [place] NONE 09/13/2021 0459  ? SPECREQUEST NONE Reflexed from [id number] 09/13/2021 0459  ? CULT  09/13/2021 0459  ?  FEW Normal respiratory flora-no Staph aureus or Pseudomonas seen ?Performed at [facility], [place] [address] [place],  [address] ?  ? REPTSTATUS 09/13/2021 FINAL 09/13/2021 0459  ? REPTSTATUS 09/15/2021 FINAL 09/13/2021 0459  ? ? ?Cardiac Enzymes: ?No results for input(s): CKTOTAL, CKMB, CKMBINDEX, TROPONINI in the last 168 hours. ?CBG: ?Recent Labs  ?Lab 11/24/21 ?0092 11/24/21 ?1157 11/24/21 ?1635 11/24/21 ?2150 11/25/21 ?3300  ?GLUCAP 91 97 98 203* 85  ? ?Iron Studies: No results for input(s): IRON, TIBC, TRANSFERRIN, FERRITIN in the last 72 hours. ?Lab Results  ?Component Value Date  ? INR 1.2 11/21/2021  ? INR 1.0 08/03/2021  ? INR 0.92  10/22/2013  ? ?Studies/Results: ?PERIPHERAL VASCULAR CATHETERIZATION ? ?Result Date: 11/23/2021 ?DATE OF SERVICE: 11/23/2021  PATIENT:  Gillermo Murdoch  66 y.o. female  PRE-OPERATIVE DIAGNOSIS:  Atherosclerosis of native arteries of bilateral lower extremities causing ischemic rest pain  POST-OPERATIVE DIAGNOSIS:  Same  PROCEDURE:  1) US guided right common femoral access 2) Aortogram with bilateral lower extremity runoff angiogram 3) Left angiogram with third order cannulation 4) Conscious sedation (33 minutes)  SURGEON:  [name]. Stanford Breed, MD  ASSISTANT: none  ANESTHESIA:   local and IV sedation  ESTIMATED BLOOD LOSS: minimal  LOCAL MEDICATIONS USED:  LIDOCAINE  COUNTS: confirmed correct.  PATIENT DISPOSITION:  PACU - hemodynamically stable.  Delay start of Pharmacological VTE agent (>24hrs) due to surgical blood loss or risk of bleeding: no  INDICATION FOR PROCEDURE: PRECIOSA BUNDRICK is a 66 y.o. female with  bilateral lower extremity ischemic rest pain. Preoperative CT angiogram was not diagnostic. After careful discussion of risks, benefits, and alternatives the patient was offered angiography with possible intervention. The patient  understood and wished to proceed.  OPERATIVE FINDINGS: Terminal aorta and iliac arteries: Heavily diseased, but widely patent  Right lower extremity: Common femoral artery: 50% stenosis Profunda femoris artery: Heavily diseased, but widely patent Superficial femoral artery: Patent at its origin, but occludes. Heavily diseased. Popliteal artery: Reconstitution behind the knee Anterior tibial artery: Heavily diseased; dominant tibial Tibioperoneal trunk: heavily diseased but patent Peroneal artery: heavily diseased, occludes mid calf Posterior tibial artery: Pedal circulation: not well seen.  Left lower extremity: Common femoral artery: 70% stenosis Profunda femoris artery: Heavily diseased, but widely patent Superficial femoral artery: Patent at its origin, but occludes. Heavily diseased. Popliteal artery: Reconstitution behind the knee Anterior tibial artery: Heavily diseased; dominant tibial Tibioperoneal trunk: heavily diseased but patent Peroneal artery: heavily diseased, occludes mid calf Posterior tibial artery: Pedal circulation: not well seen.  DESCRIPTION OF PROCEDURE: After identification of the patient in the pre-operative holding area, the patient was transferred to the operating room. The patient was positioned supine on the operating room table. Anesthesia was induced. The groins was prepped and draped in standard fashion. A surgical pause was performed confirming correct patient, procedure, and operative location.  The right groin was anesthetized with subcutaneous injection of 1% lidocaine. Using ultrasound guidance, the right common femoral artery was accessed with micropuncture technique. Fluoroscopy was used to confirm cannulation over the femoral head. The 49F sheath was  upsized to 29F.  A Benson wire was advanced into the distal aorta. Over the wire an omni flush catheter was advanced to the level of L2. Aortogram was performed - see above for details. Bilateral lower extremity runoff angiography was performed - see above for details.  The left common iliac artery was selected with a glidewire guidewire. The wire was advanced into the common femoral artery. Over the wire the omni flush catheter was advanced into the external iliac artery. Selective angiography was performed - see above for details.  The decision was made to intervene. The patient was heparinized with 7000 units of heparin. The 29F sheath was exchanged for a 41F x 45cm sheath. Selective angiography of the left lower extremity was performed prior to intervention.  I tried to cross the left SFA occlusion but was not able to cross the heavily diseased lesion.  The sheath was left in place to be removed in the recovery area.  Conscious sedation was administered with the use of IV fentanyl and midazolam under continuous physician and nurse monitoring.  Heart rate, blood pressure, and oxygen saturation were continuously monitored.  Total sedation time was 33 minutes  Upon completion of the case instrument and sharps counts were confirmed correct. The patient was transferred to the PACU in good condition. I was present for all portions of the procedure.  PLAN: ASA 81mg  PO QD. High intensity statin. Needs bilateral fem pop bypasses. Will plan to do this in staged fashion. Will ask cardiology to evaluate for risk stratification.  [name]. Stanford Breed, MD [facility] Phone Number: [phone] 11/23/2021 7:00 PM  ? ?VAS [place] LOWER EXTREMITY SAPHENOUS VEIN MAPPING ? ?Result Date: 11/24/2021 ?LOWER EXTREMITY VEIN MAPPING Patient Name:  DANICKA HOURIHAN  Date of Exam:   11/24/2021 Medical Rec #: [id number]       Accession #:    [id number] Date of Birth: 08-24-1956       Patient Gender: F Patient Age:    89 years Exam Location:  [facility] Procedure:      VAS [place] LOWER [place] Referring Phys: Jamelle Haring -----------------------------------------------------------------------

## 2021-11-26 ENCOUNTER — Encounter (HOSPITAL_COMMUNITY): Payer: Self-pay | Admitting: Vascular Surgery

## 2021-11-26 DIAGNOSIS — Z515 Encounter for palliative care: Secondary | ICD-10-CM

## 2021-11-26 DIAGNOSIS — Z7189 Other specified counseling: Secondary | ICD-10-CM

## 2021-11-26 DIAGNOSIS — I2510 Atherosclerotic heart disease of native coronary artery without angina pectoris: Secondary | ICD-10-CM | POA: Diagnosis not present

## 2021-11-26 DIAGNOSIS — I70223 Atherosclerosis of native arteries of extremities with rest pain, bilateral legs: Secondary | ICD-10-CM | POA: Diagnosis not present

## 2021-11-26 DIAGNOSIS — N186 End stage renal disease: Secondary | ICD-10-CM | POA: Diagnosis not present

## 2021-11-26 DIAGNOSIS — Z66 Do not resuscitate: Secondary | ICD-10-CM

## 2021-11-26 DIAGNOSIS — J9611 Chronic respiratory failure with hypoxia: Secondary | ICD-10-CM

## 2021-11-26 DIAGNOSIS — Z992 Dependence on renal dialysis: Secondary | ICD-10-CM

## 2021-11-26 DIAGNOSIS — I5022 Chronic systolic (congestive) heart failure: Secondary | ICD-10-CM | POA: Diagnosis not present

## 2021-11-26 LAB — RENAL FUNCTION PANEL
Albumin: 2.7 g/dL — ABNORMAL LOW (ref 3.5–5.0)
Anion gap: 11 (ref 5–15)
BUN: 52 mg/dL — ABNORMAL HIGH (ref 8–23)
CO2: 25 mmol/L (ref 22–32)
Calcium: 8.5 mg/dL — ABNORMAL LOW (ref 8.9–10.3)
Chloride: 99 mmol/L (ref 98–111)
Creatinine, Ser: 7.19 mg/dL — ABNORMAL HIGH (ref 0.44–1.00)
GFR, Estimated: 6 mL/min — ABNORMAL LOW (ref >60)
Glucose, Bld: 137 mg/dL — ABNORMAL HIGH (ref 70–99)
Phosphorus: 6.9 mg/dL — ABNORMAL HIGH (ref 2.5–4.6)
Potassium: 4.4 mmol/L (ref 3.5–5.1)
Sodium: 135 mmol/L (ref 135–145)

## 2021-11-26 LAB — CBC
HCT: 31.9 % — ABNORMAL LOW (ref 36.0–46.0)
Hemoglobin: 9.4 g/dL — ABNORMAL LOW (ref 12.0–15.0)
MCH: 27.3 pg (ref 26.0–34.0)
MCHC: 29.5 g/dL — ABNORMAL LOW (ref 30.0–36.0)
MCV: 92.7 fL (ref 80.0–100.0)
Platelets: 179 10*3/uL (ref 150–400)
RBC: 3.44 MIL/uL — ABNORMAL LOW (ref 3.87–5.11)
RDW: 16.7 % — ABNORMAL HIGH (ref 11.5–15.5)
WBC: 6.2 10*3/uL (ref 4.0–10.5)
nRBC: 0 % (ref 0.0–0.2)

## 2021-11-26 LAB — GLUCOSE, CAPILLARY
Glucose-Capillary: 122 mg/dL — ABNORMAL HIGH (ref 70–99)
Glucose-Capillary: 119 mg/dL — ABNORMAL HIGH (ref 70–99)
Glucose-Capillary: 157 mg/dL — ABNORMAL HIGH (ref 70–99)
Glucose-Capillary: 181 mg/dL — ABNORMAL HIGH (ref 70–99)

## 2021-11-26 LAB — POCT ACTIVATED CLOTTING TIME: Activated Clotting Time: 221 seconds

## 2021-11-26 MED ORDER — HYDROMORPHONE HCL 1 MG/ML IJ SOLN
0.5000 mg | Freq: Once | INTRAMUSCULAR | Status: AC
Start: 2021-11-26 — End: 2021-11-26
  Administered 2021-11-26: 0.5 mg via INTRAVENOUS
  Filled 2021-11-26: qty 0.5

## 2021-11-26 MED ORDER — HYDROMORPHONE HCL 2 MG PO TABS
4.0000 mg | ORAL_TABLET | ORAL | Status: DC | PRN
  Administered 2021-11-26: 4 mg via ORAL
  Filled 2021-11-26: qty 2

## 2021-11-26 MED ORDER — DARBEPOETIN ALFA 25 MCG/0.42ML IJ SOSY
25.0000 ug | PREFILLED_SYRINGE | INTRAMUSCULAR | Status: DC
  Administered 2021-11-26: 25 ug via INTRAVENOUS
  Filled 2021-11-26: qty 0.42

## 2021-11-26 MED ORDER — HYDROMORPHONE HCL 1 MG/ML IJ SOLN
0.5000 mg | INTRAMUSCULAR | Status: DC | PRN
  Administered 2021-11-26 – 2021-11-27 (×3): 0.5 mg via INTRAVENOUS
  Filled 2021-11-26 (×3): qty 1

## 2021-11-26 MED FILL — Lidocaine HCl Local Preservative Free (PF) Inj 1%: INTRAMUSCULAR | Qty: 30 | Status: AC

## 2021-11-26 MED FILL — Heparin Sod (Porcine)-NaCl IV Soln 1000 Unit/500ML-0.9%: INTRAVENOUS | Qty: 1000 | Status: AC

## 2021-11-26 MED FILL — Fentanyl Citrate Preservative Free (PF) Inj 100 MCG/2ML: INTRAMUSCULAR | Qty: 2 | Status: AC

## 2021-11-26 NOTE — Care Management Important Message (Signed)
Important Message ? ?Patient Details  ?Name: Tina Gomez ?MRN: [id number] ?Date of Birth: [birth year] ? ? ?Medicare Important Message Given:  Yes ? ? ? ? ?[name] ?11/26/2021, 11:00 AM ?

## 2021-11-26 NOTE — Progress Notes (Signed)
? ?Progress Note ? ?Patient Name: Tina Gomez ?Date of Encounter: 11/26/2021 ? ?[facility] Cardiologist: Kirk Ruths, MD   ? ?Subjective  ? ?Pt is doing well . ? ?No CP  ?Is at moderate - high risk for her procedure but had a stable cath and does not require any further evaluation prior to surgery  ? ? ? ?Inpatient Medications  ?  ?Scheduled Meds: ? aspirin  325 mg Oral Daily  ? atorvastatin  80 mg Oral q1800  ? carvedilol  12.5 mg Oral QHS  ? And  ? carvedilol  12.5 mg Oral Once per day on Sun Tue Thu  ? Chlorhexidine Gluconate Cloth  6 each Topical Q0600  ? darbepoetin (ARANESP) injection - DIALYSIS  25 mcg Intravenous Q Mon-HD  ? doxercalciferol  1 mcg Intravenous Q M,W,F-HD  ? FLUoxetine  20 mg Oral QHS  ? gabapentin  200 mg Oral QHS  ? heparin  5,000 Units Subcutaneous Q8H  ? insulin aspart  0-6 Units Subcutaneous TID WC  ? losartan  100 mg Oral Daily  ? multivitamin  1 tablet Oral QHS  ? sodium chloride flush  3 mL Intravenous Q12H  ? sodium chloride flush  3 mL Intravenous Q12H  ? sucroferric oxyhydroxide  500 mg Oral TID WC  ? ?Continuous Infusions: ? sodium chloride    ? [START ON 11/28/2021] iron sucrose    ? ?PRN Meds: ?sodium chloride, acetaminophen **OR** acetaminophen, acetaminophen, albuterol, HYDROmorphone (DILAUDID) injection, HYDROmorphone, hydrOXYzine, labetalol, labetalol, ondansetron **OR** ondansetron (ZOFRAN) IV, ondansetron (ZOFRAN) IV, senna-docusate, sodium chloride flush  ? ?Vital Signs  ?  ?Vitals:  ? 11/26/21 1000 11/26/21 1030 11/26/21 1100 11/26/21 1300  ?BP: (!) 100/54 (!) 92/47 (!) 151/67 (!) 181/57  ?Pulse: (!) 53 (!) 52 (!) 54 62  ?Resp:    16  ?Temp:    (!) 97.5 ?F (36.4 ?C)  ?TempSrc:    Oral  ?SpO2:    98%  ?Weight:      ?Height:      ? ?No intake or output data in the 24 hours ending 11/26/21 1401 ?Last 3 Weights 11/26/2021 11/26/2021 11/25/2021  ?Weight (lbs) 190 lb 11.2 oz 167 lb 8.8 oz 172 lb 9.9 oz  ?Weight (kg) 86.5 kg 76 kg 78.3 kg  ?   ? ?Telemetry  ?  ?NSR  -  Personally Reviewed ? ?ECG  ?  ? - Personally Reviewed ? ?Physical Exam  ?  ?GEN: elderly female,  appears older than stated age. No acute distress.   ?Neck: No JVD ?Cardiac: RRR, no murmurs, rubs, or gallops.  ?Respiratory: Clear to auscultation bilaterally. ?GI: Soft, nontender, non-distended  ?MS: No edema; No deformity. ?Neuro:  Nonfocal  ?Psych: Normal affect  ? ?Labs  ?  ?High Sensitivity Troponin:  No results for input(s): TROPONINIHS in the last 720 hours.   ?Chemistry ?Recent Labs  ?Lab 11/21/21 ?1842 11/22/21 ?8938 11/23/21 ?1017 11/23/21 ?1924 11/24/21 ?5102 11/26/21 ?0155  ?NA 136   < > 134*  --  134* 135  ?K 3.4*   < > 4.7  --  4.8 4.4  ?CL 95*   < > 94*  --  94* 99  ?CO2 27   < > 25  --  24 25  ?GLUCOSE 97   < > 113*  --  87 137*  ?BUN 29*   < > 56*  --  65* 52*  ?CREATININE 4.14*   < > 7.00* 7.69* 8.09* 7.19*  ?CALCIUM 9.0   < >  9.0  --  9.0 8.5*  ?PROT 7.1  --   --   --   --   --   ?ALBUMIN 3.1*  --   --   --  2.8* 2.7*  ?AST 18  --   --   --   --   --   ?ALT 10  --   --   --   --   --   ?ALKPHOS 73  --   --   --   --   --   ?BILITOT 0.7  --   --   --   --   --   ?GFRNONAA 11*   < > 6* 5* 5* 6*  ?ANIONGAP 14   < > 15  --  16* 11  ? < > = values in this interval not displayed.  ?  ?Lipids  ?Recent Labs  ?Lab 11/24/21 ?0318  ?CHOL 166  ?TRIG 157*  ?HDL 33*  ?LDLCALC 102*  ?CHOLHDL 5.0  ?  ?Hematology ?Recent Labs  ?Lab 11/24/21 ?0318 11/25/21 ?0206 11/26/21 ?0155  ?WBC 7.1 6.5 6.2  ?RBC 3.80* 3.52* 3.44*  ?HGB 10.2* 9.6* 9.4*  ?HCT 34.3* 32.3* 31.9*  ?MCV 90.3 91.8 92.7  ?MCH 26.8 27.3 27.3  ?MCHC 29.7* 29.7* 29.5*  ?RDW 17.0* 17.1* 16.7*  ?PLT 234 187 179  ? ?Thyroid No results for input(s): TSH, FREET4 in the last 168 hours.  ?BNPNo results for input(s): BNP, PROBNP in the last 168 hours.  ?DDimer No results for input(s): DDIMER in the last 168 hours.  ? ?Radiology  ?  ?VAS [place] LOWER EXTREMITY SAPHENOUS VEIN MAPPING ? ?Result Date: 11/25/2021 ?[place] Patient Name:  KRYSTLE POLCYN   Date of Exam:   11/24/2021 Medical Rec #: [id number]       Accession #:    [id number] Date of Birth: October 01, 1955       Patient Gender: F Patient Age:   22 years Exam Location:  [facility] Procedure:      VAS [place] LOWER EXTREMITY SAPHENOUS VEIN MAPPING Referring Phys: Jamelle Haring --------------------------------------------------------------------------------  Indications:       Pre-op Other Indications: Bilateral critical limb ischemia Risk Factors:      Hypertension, hyperlipidemia, Diabetes, current smoker,                    coronary artery disease, PAD.  Comparison Study: No prior study Performing Technologist: Sharion Dove RVS  Examination Guidelines: A complete evaluation includes B-mode imaging, spectral Doppler, color Doppler, and power Doppler as needed of all accessible portions of each vessel. Bilateral testing is considered an integral part of a complete examination. Limited examinations for reoccurring indications may be performed as noted. +---------------+-----------+----------------------+---------------+-----------+   RT Diameter  RT Findings         GSV            LT Diameter  LT Findings      (cm)                                            (cm)                  +---------------+-----------+----------------------+---------------+-----------+      0.81                     Saphenofemoral  0.82       branching                                   Junction                                  +---------------+-----------+----------------------+---------------+-----------+      0.42                     Proximal thigh         0.43                  +---------------+-----------+----------------------+---------------+-----------+      0.36                       Mid thigh            0.38                  +---------------+-----------+----------------------+---------------+-----------+      0.31                      Distal thigh          0.38                   +---------------+-----------+----------------------+---------------+-----------+      0.42                          Knee              0.30                  +---------------+-----------+----------------------+---------------+-----------+      0.26                       Prox calf            0.39                  +---------------+-----------+----------------------+---------------+-----------+      0.23       branching        Mid calf            0.35       branching  +---------------+-----------+----------------------+---------------+-----------+      0.21                      Distal calf           0.20                  +---------------+-----------+----------------------+---------------+-----------+      0.30       branching         Ankle              0.25                  +---------------+-----------+----------------------+---------------+-----------+ Diagnosing physician: Jamelle Haring Electronically signed by Jamelle Haring on 11/25/2021 at 11:20:16 AM.    Final    ? ?Cardiac Studies  ? ?  ? ?Patient Profile  ?   ?66 y.o. female   ? ?Assessment & Plan  ?  ? 1. CAD :  no angina .  Cath was stable .  Not limited by angina  ? ?2.  PAD :  limited by claudication  ? ?3.  ESRD:  on HD .  ? ?4. Cigarette smoking :   advised cessation  ? ? ? ?For questions or updates, please contact [place] ?Please consult [email or web address] for contact info under  ? ?  ?   ?Signed, ?Mertie Moores, MD  ?11/26/2021, 2:01 PM   ? ?

## 2021-11-26 NOTE — Consult Note (Signed)
? ?                                                                                ?Consultation Note ?Date: 11/26/2021  ? ?Patient Name: Tina Gomez  ?DOB: [birth year]  MRN: [id number]  Age / Sex: 66 y.o., female  ?PCP: Charlott Rakes, MD ?Referring Physician: Patrecia Pour, MD ? ?Reason for Consultation: Establishing goals of care, symptom management ?"Uncontrolled pain w/ critical limb ischemia. Assistance with pain control and additional education regarding option to transition to comfort measures in lieu of surgery" ? ?HPI/Patient Profile: 65 y.o. 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 She presented to the ED on on 11/21/2021 with pain in both feet for 2 weeks that had worsened over the past 2 days. Patient also reported cold sensation and discoloration to both feet.  ?ABIs showed moderate RLE arterial disease and critical left limb ischemia. Admitted to [facility].  ? ?Clinical Assessment and Goals of Care: ?I have reviewed medical records including EPIC notes, labs and imaging, and met at bedside with patient and her daughter Tina Gomez to discuss diagnosis, prognosis, GOC, EOL wishes, disposition, and options. ? ?Patient is known to Palliative Medicine from her previous hospitalization. She was also seen by outpatient palliative ([facility]) in January 2023. I re-introduced Palliative Medicine as specialized medical care for people living with serious illness. It focuses on providing relief from the symptoms and stress of a serious illness.  ? ?We discussed a brief life review of the patient. Tina Gomez lives in [place]. She had 4 children - 1 daughter passed away so there are 3 living children. Her son has severe autism and she had been his primary caregiver his entire life. She recently had legal guardianship transferred over to  Tina Gomez. Tina Gomez also has 6 grandchildren and 1 great-grandchild.  ? ?Tina Gomez has lived with Tina Gomez for the past 15 years. Due to the severe pain in both feet, Neve has been unable to walk the past 2 weeks or so. Prior to that, she was only able to walk from her bed to the bathroom.  ?As far as nutritional status, she has lost 75 pounds over the past year.  ? ?We discussed Ainsleigh's current illness and what it means in the larger context of her ongoing co-morbidities. We discussed the plan of care from vascular surgery. Tina Gomez understands she needs staged bilateral endarterectomies and femoral popliteal bypass. She understands she is very high risk for complications, but feels it is "worth a try" as she cannot live in this much pain. She does not want to undergo amputation.  ? ?Kahley tells me that she is a "realist" and that she knows she is dying. Tina Gomez is tearful as she states that she is aware her mother's time on this earth is limited. She states she has seen her quality of life decline. Tina Gomez requests to be changed to regular diet, stating she should be able to eat what she wants given her overall prognosis. I agree with this.  ? ?I attempted  to elicit values and goals of care important to the patient. She speaks mainly to the importance of her family and her faith.  ? ?The difference between aggressive medical intervention and comfort care was considered in light of the patient's goals of care. She is not ready for comfort care at this time, but understands it is an option.  ? ?Advanced directives, concepts specific to code status, artifical feeding and hydration, and rehospitalization were considered and discussed. ?Reviewed MOST form on file from January 2023; patient does not wish to make any changes at this time.  ? ?Cardiopulmonary Resuscitation: Do Not Attempt Resuscitation (DNR/No CPR)  ?Medical Interventions: Limited Additional Interventions: Use medical treatment, IV fluids and cardiac monitoring as indicated, DO  NOT USE intubation or mechanical ventilation. May consider use of less invasive airway support such as BiPAP or CPAP. Also provide comfort measures. Transfer to the hospital if indicated. Avoid intensive care.   ?Antibiotics: Antibiotics if indicated  ?IV Fluids: IV fluids for a defined trial period  ?Feeding Tube: No feeding tube  ? ? ?Questions and concerns were addressed.  The family was encouraged to call with questions or concerns.  ? ? ?Primary decision maker: Patient is her own decision maker at this time. HCPOA is her daughter Tina Gomez (document is on file in [place]) ?  ? ?SUMMARY OF RECOMMENDATIONS   ?DNR/DNI as previously documented ?Diet changed to Regular ?Patient is very realistic and understands her overall prognosis is poor ?She confirms desire to proceed with surgical management and is hopeful it will result in decreased foot pain ?PMT will continue to follow and support ? ? ?Symptom Management:  ?Dilaudid 4 mg po every 3 hours as needed ?Dilaudid 0.5 mg IV every 3 hours prn for severe pain not relieved with po Dilaudid ? ?Prognosis:  ?Unable to determine, she is high risk for complications and acute decline ? ?Discharge Planning: To Be Determined  ? ?  ? ?Primary Diagnoses: ?Present on Admission: ? Critical limb ischemia of both lower extremities ([place]) ? Chronic respiratory failure with hypoxia (HCC) ? COPD (chronic obstructive pulmonary disease) ([place]) ? Anxiety and depression ? Chronic systolic CHF (congestive heart failure) ([place]) ? Essential hypertension, benign ? Type 2 diabetes mellitus with diabetic neuropathy, unspecified ([place]) ? CAD (coronary artery disease) ? Hyperlipidemia ? ? ?I have reviewed the medical record, interviewed the patient and family, and examined the patient. The following aspects are pertinent. ? ?Past Medical History:  ?Diagnosis Date  ? Anemia   ? CAD (coronary artery disease)   ? Chronic respiratory failure with hypoxia (HCC)   ? COPD (chronic obstructive pulmonary disease)  ([place]) 05/2019  ? no inhaler  ? Diabetes mellitus without complication ([place])   ? no meds - diet controlled  ? ESRD on hemodialysis ([place])   ? History of blood transfusion 09/2020  ? 1 unit  ? HLD (hyperlipidemia)   ? Hypertension   ? Peripheral vascular disease ([place])   ? PONV (postoperative nausea and vomiting)   ? Restless legs syndrome (RLS)   ? Stroke [facility])   ? ? ? ?Family History  ?Problem Relation Age of Onset  ? Stroke Mother   ? Hypertension Mother   ? Hyperlipidemia Mother   ? Hypertension Father   ? Hyperlipidemia Father   ? ?Scheduled Meds: ? aspirin  325 mg Oral Daily  ? atorvastatin  80 mg Oral q1800  ? carvedilol  12.5 mg Oral QHS  ? And  ? carvedilol  12.5 mg Oral  Once per day on Sun Tue Thu  ? Chlorhexidine Gluconate Cloth  6 each Topical Q0600  ? darbepoetin (ARANESP) injection - DIALYSIS  25 mcg Intravenous Q Mon-HD  ? doxercalciferol  1 mcg Intravenous Q M,W,F-HD  ? FLUoxetine  20 mg Oral QHS  ? gabapentin  200 mg Oral QHS  ? heparin  5,000 Units Subcutaneous Q8H  ? insulin aspart  0-6 Units Subcutaneous TID WC  ? losartan  100 mg Oral Daily  ? multivitamin  1 tablet Oral QHS  ? sodium chloride flush  3 mL Intravenous Q12H  ? sodium chloride flush  3 mL Intravenous Q12H  ? sucroferric oxyhydroxide  500 mg Oral TID WC  ? ?Continuous Infusions: ? sodium chloride    ? sodium chloride    ? [START ON 11/28/2021] iron sucrose    ? ?PRN Meds:.sodium chloride, sodium chloride, sodium chloride, acetaminophen **OR** acetaminophen, acetaminophen, albuterol, alteplase, heparin, HYDROmorphone (DILAUDID) injection, HYDROmorphone, hydrOXYzine, labetalol, labetalol, lidocaine (PF), lidocaine-prilocaine, ondansetron **OR** ondansetron (ZOFRAN) IV, ondansetron (ZOFRAN) IV, pentafluoroprop-tetrafluoroeth, senna-docusate, sodium chloride flush ? ? ?Allergies  ?Allergen Reactions  ? Umeclidinium Bromide Anaphylaxis  ? Codeine Nausea And Vomiting  ? Adhesive [Tape] Other (See Comments)  ?  Tape breaks out the skin if it is  left on for a lengthy period of time  ? ?Review of Systems  ?Cardiovascular:   ?     Bilateral foot pain  ? ?Physical Exam ?Vitals reviewed.  ?Constitutional:   ?   General: She is not in acute distr

## 2021-11-26 NOTE — Progress Notes (Addendum)
 [facility] Progress Note   Subjective: Seen on HD. Planning for surgery even though she has been told hospice/comfort care might be a better option. Says she doesn't want to lose her legs.   Objective Vitals:   11/26/21 0600 11/26/21 0831 11/26/21 0838 11/26/21 0900  BP:  (!) 165/75 (!) 157/78 (!) 157/73  Pulse: 63 (!) 57 (!) 58 (!) 56  Resp:      Temp:  98.4 F (36.9 C)    TempSrc:      SpO2: 99% 99% 97%   Weight:  86.5 kg    Height:       Physical Exam General: Chronically ill appearing female in NAD Heart: S1,S2 RRR No M/R/G Lungs: CTAB Abdomen: NABS Extremities: No LE edema Dialysis Access: L AVF + T/B   Additional Objective Labs: Basic Metabolic Panel: Recent Labs  Lab 11/23/21 0552 11/23/21 1924 11/24/21 0318 11/26/21 0155  NA 134*  --  134* 135  K 4.7  --  4.8 4.4  CL 94*  --  94* 99  CO2 25  --  24 25  GLUCOSE 113*  --  87 137*  BUN 56*  --  65* 52*  CREATININE 7.00* 7.69* 8.09* 7.19*  CALCIUM  9.0  --  9.0 8.5*  PHOS  --   --  9.4* 6.9*   Liver Function Tests: Recent Labs  Lab 11/21/21 1842 11/24/21 0318 11/26/21 0155  AST 18  --   --   ALT 10  --   --   ALKPHOS 73  --   --   BILITOT 0.7  --   --   PROT 7.1  --   --   ALBUMIN  3.1* 2.8* 2.7*   No results for input(s): LIPASE, AMYLASE in the last 168 hours. CBC: Recent Labs  Lab 11/21/21 1842 11/22/21 0336 11/23/21 0552 11/24/21 0318 11/25/21 0206 11/26/21 0155  WBC 6.5 6.2 6.8 7.1 6.5 6.2  NEUTROABS 4.2  --   --   --   --   --   HGB 12.4 10.8* 11.2* 10.2* 9.6* 9.4*  HCT 40.5 36.9 37.3 34.3* 32.3* 31.9*  MCV 90.4 90.9 89.9 90.3 91.8 92.7  PLT 232 200 219 234 187 179   Blood Culture    Component Value Date/Time   SDES EXPECTORATED SPUTUM 09/13/2021 0459   SDES EXPECTORATED SPUTUM 09/13/2021 0459   SPECREQUEST NONE 09/13/2021 0459   SPECREQUEST NONE Reflexed from [id number] 09/13/2021 0459   CULT  09/13/2021 0459    FEW Normal respiratory flora-no Staph aureus or  Pseudomonas seen Performed at [facility], [address], [place], [place] [address]    REPTSTATUS 09/13/2021 FINAL 09/13/2021 0459   REPTSTATUS 09/15/2021 FINAL 09/13/2021 0459    Cardiac Enzymes: No results for input(s): CKTOTAL, CKMB, CKMBINDEX, TROPONINI in the last 168 hours. CBG: Recent Labs  Lab 11/25/21 0728 11/25/21 1116 11/25/21 1553 11/25/21 2117 11/26/21 0749  GLUCAP 85 104* 192* 156* 122*   Iron  Studies: No results for input(s): IRON , TIBC, TRANSFERRIN, FERRITIN in the last 72 hours. @lablastinr3 @ Studies/Results: VAS US  LOWER EXTREMITY SAPHENOUS VEIN MAPPING  Result Date: 11/25/2021 LOWER EXTREMITY VEIN MAPPING Patient Name:  BREYA CASS  Date of Exam:   11/24/2021 Medical Rec #: [id number]       Accession #:    [id number] Date of Birth: 1956-01-15       Patient Gender: F Patient Age:   10 years Exam Location:  [facility] Procedure:  VAS US  LOWER EXTREMITY SAPHENOUS VEIN MAPPING Referring Phys: DEBBY ROBERTSON --------------------------------------------------------------------------------  Indications:       Pre-op Other Indications: Bilateral critical limb ischemia Risk Factors:      Hypertension, hyperlipidemia, Diabetes, current smoker,                    coronary artery disease, PAD.  Comparison Study: No prior study Performing Technologist: Alberta Lis RVS  Examination Guidelines: A complete evaluation includes B-mode imaging, spectral Doppler, color Doppler, and power Doppler as needed of all accessible portions of each vessel. Bilateral testing is considered an integral part of a complete examination. Limited examinations for reoccurring indications may be performed as noted. +---------------+-----------+----------------------+---------------+-----------+   RT Diameter  RT Findings         GSV            LT Diameter  LT Findings      (cm)                                            (cm)                   +---------------+-----------+----------------------+---------------+-----------+      0.81                     Saphenofemoral         0.82       branching                                   Junction                                  +---------------+-----------+----------------------+---------------+-----------+      0.42                     Proximal thigh         0.43                  +---------------+-----------+----------------------+---------------+-----------+      0.36                       Mid thigh            0.38                  +---------------+-----------+----------------------+---------------+-----------+      0.31                      Distal thigh          0.38                  +---------------+-----------+----------------------+---------------+-----------+      0.42                          Knee              0.30                  +---------------+-----------+----------------------+---------------+-----------+      0.26                       Prox calf  0.39                  +---------------+-----------+----------------------+---------------+-----------+      0.23       branching        Mid calf            0.35       branching  +---------------+-----------+----------------------+---------------+-----------+      0.21                      Distal calf           0.20                  +---------------+-----------+----------------------+---------------+-----------+      0.30       branching         Ankle              0.25                  +---------------+-----------+----------------------+---------------+-----------+ Diagnosing physician: Debby Robertson Electronically signed by Debby Robertson on 11/25/2021 at 11:20:16 AM.    Final    ECHOCARDIOGRAM COMPLETE  Result Date: 11/24/2021    ECHOCARDIOGRAM REPORT   Patient Name:   DARNISE MONTAG Date of Exam: 11/24/2021 Medical Rec #:  [id number]      Height:       62.0 in Accession #:     [id number]     Weight:       172.0 lb Date of Birth:  [birth year]      BSA:          1.793 m Patient Age:    65 years       BP:           156/47 mmHg Patient Gender: F              HR:           63 bpm. Exam Location:  Inpatient Procedure: 2D Echo, Cardiac Doppler and Color Doppler Indications:    Pre op  History:        Patient has prior history of Echocardiogram examinations, most                 recent 11/08/2020. COPD and Stroke; Risk Factors:Hypertension,                 Diabetes and Current Smoker. CKD on HD.  Sonographer:    Vernell Hammersmith RDCS Referring Phys: [id number] DEBBY SAILOR HAWKEN IMPRESSIONS  1. Left ventricular ejection fraction, by estimation, is 30 to 35%. The left ventricle has moderately decreased function. The left ventricle demonstrates global hypokinesis. There is mild left ventricular hypertrophy. Left ventricular diastolic parameters are consistent with Grade I diastolic dysfunction (impaired relaxation).  2. Right ventricular systolic function is mildly reduced. The right ventricular size is normal. Tricuspid regurgitation signal is inadequate for assessing PA pressure.  3. Left atrial size was moderately dilated.  4. Right atrial size was mildly dilated.  5. A small pericardial effusion is present. There is no evidence of cardiac tamponade.  6. The mitral valve is grossly normal. No evidence of mitral valve regurgitation.  7. The aortic valve is grossly normal. Aortic valve regurgitation is not visualized.  8. The inferior vena cava is normal in size with greater than 50% respiratory variability, suggesting right atrial pressure of 3 mmHg. Comparison(s): No significant change from prior study. FINDINGS  Left Ventricle: Left ventricular ejection fraction, by estimation, is 30  to 35%. The left ventricle has moderately decreased function. The left ventricle demonstrates global hypokinesis. The left ventricular internal cavity size was normal in size. There is mild left ventricular hypertrophy. Left  ventricular diastolic parameters are consistent with Grade I diastolic dysfunction (impaired relaxation). Right Ventricle: The right ventricular size is normal. No increase in right ventricular wall thickness. Right ventricular systolic function is mildly reduced. Tricuspid regurgitation signal is inadequate for assessing PA pressure. Left Atrium: Left atrial size was moderately dilated. Right Atrium: Right atrial size was mildly dilated. Pericardium: A small pericardial effusion is present. There is no evidence of cardiac tamponade. Mitral Valve: The mitral valve is grossly normal. No evidence of mitral valve regurgitation. Tricuspid Valve: The tricuspid valve is grossly normal. Tricuspid valve regurgitation is not demonstrated. Aortic Valve: The aortic valve is grossly normal. Aortic valve regurgitation is not visualized. Aortic valve mean gradient measures 4.0 mmHg. Aortic valve peak gradient measures 8.1 mmHg. Aortic valve area, by VTI measures 2.54 cm. Pulmonic Valve: The pulmonic valve was not well visualized. Pulmonic valve regurgitation is not visualized. Aorta: The aortic root and ascending aorta are structurally normal, with no evidence of dilitation. Venous: The inferior vena cava is normal in size with greater than 50% respiratory variability, suggesting right atrial pressure of 3 mmHg. IAS/Shunts: No atrial level shunt detected by color flow Doppler.  LEFT VENTRICLE PLAX 2D LVIDd:         5.70 cm      Diastology LVIDs:         3.90 cm      LV e' medial:    3.00 cm/s LV PW:         1.40 cm      LV E/e' medial:  27.1 LV IVS:        1.50 cm      LV e' lateral:   5.40 cm/s LVOT diam:     2.20 cm      LV E/e' lateral: 15.0 LV SV:         88 LV SV Index:   49 LVOT Area:     3.80 cm                              3D Volume EF: LV Volumes (MOD)            3D EF:        39 % LV vol d, MOD A2C: 166.0 ml LV EDV:       237 ml LV vol d, MOD A4C: 198.0 ml LV ESV:       145 ml LV vol s, MOD A2C: 100.0 ml LV SV:         92 ml LV vol s, MOD A4C: 110.0 ml LV SV MOD A2C:     66.0 ml LV SV MOD A4C:     198.0 ml LV SV MOD BP:      74.3 ml RIGHT VENTRICLE RV Basal diam:  3.90 cm RV S prime:     14.70 cm/s TAPSE (M-mode): 1.4 cm LEFT ATRIUM              Index        RIGHT ATRIUM           Index LA diam:        4.90 cm  2.73 cm/m   RA Area:     14.20 cm LA Vol (A2C):   107.0  ml 59.68 ml/m  RA Volume:   30.70 ml  17.12 ml/m LA Vol (A4C):   95.4 ml  53.21 ml/m LA Biplane Vol: 103.0 ml 57.45 ml/m  AORTIC VALVE AV Area (Vmax):    2.64 cm AV Area (Vmean):   2.45 cm AV Area (VTI):     2.54 cm AV Vmax:           142.00 cm/s AV Vmean:          97.500 cm/s AV VTI:            0.346 m AV Peak Grad:      8.1 mmHg AV Mean Grad:      4.0 mmHg LVOT Vmax:         98.50 cm/s LVOT Vmean:        62.900 cm/s LVOT VTI:          0.231 m LVOT/AV VTI ratio: 0.67  AORTA Ao Root diam: 3.30 cm Ao Asc diam:  2.90 cm MITRAL VALVE MV Area (PHT): 3.08 cm    SHUNTS MV Decel Time: 246 msec    Systemic VTI:  0.23 m MV E velocity: 81.20 cm/s  Systemic Diam: 2.20 cm MV A velocity: 92.80 cm/s MV E/A ratio:  0.88 Photographer signed by Ronal Ross Signature Date/Time: 11/24/2021/4:15:49 PM    Final    Medications:  sodium chloride      sodium chloride      [START ON 11/28/2021] iron  sucrose      aspirin   325 mg Oral Daily   atorvastatin   80 mg Oral q1800   carvedilol   12.5 mg Oral QHS   And   carvedilol   12.5 mg Oral Once per day on Sun Tue Thu   Chlorhexidine  Gluconate Cloth  6 each Topical Q0600   doxercalciferol   1 mcg Intravenous Q M,W,F-HD   FLUoxetine   20 mg Oral QHS   gabapentin   200 mg Oral QHS   heparin   5,000 Units Subcutaneous Q8H   insulin  aspart  0-6 Units Subcutaneous TID WC   losartan   100 mg Oral Daily   multivitamin  1 tablet Oral QHS   sodium chloride  flush  3 mL Intravenous Q12H   sodium chloride  flush  3 mL Intravenous Q12H   sucroferric oxyhydroxide  500 mg Oral TID WC     Dialysis Orders: [place] form MWFS 3  hours EDW 75 kg 3K, 2.5 CA bath UF P2 Heparin  5000 units IV Initial bolus 2000 units IV mid run Hectorol  1 mcg IV TIW Venofer  50 mg IV weekly    Assessment/Plan: Bilateral lower extremity critical limb extremity ischemia-Followed by VVS. Needs staged bilateral femoral endarterectomies and FPB. Cardiology has cleared patient for procedure. Per primary, VVS.  ESRD -HD MWFS. HD today on schedule. Usual heparin  Hypertension/volume  -No evidence of volume overload by exam. HD 03/18 Net UF only 700cc. AM wt 76 kg. UF as tolerated.  Anemia  -Hgb variable-now 9.4 today. Gradual decline. Start Aranesp  25 mcg IV weekly. Continue weekly Venofer . Follow HBG.   Metabolic bone disease -Continue binders, VDRA.  History of HFrEF (last EF 30-35% with G1DD 11/24/2021). Optimize volume with HD. Reminded patient to adhere to fluid restrictions.  COPD/tobacco abuse-Meds per primary DM type II -Per primary.  Nutrition -ALB low.  diet renal/carb modified renal with fluid restriction renal vitamin protein supplement. History of anxiety/depression- Per primary    Allye Hoyos H. Zayon Trulson NP-C 11/26/2021, 9:23 AM  [employer] [phone]

## 2021-11-26 NOTE — Progress Notes (Signed)
removed 1453mls net fluid., unable to remove more due to hypotension nausea vomiting, gave zofran and ns bolus.  gave hectorol and asanep as ordered. gave dilaudid for pain with one time updated order.  pre bp 165/75 post bp 149/83 pre weight   76.0kg    post weight 77.1kg  bed scales questionable weights pt in too much pain to get up 2 bandages to lua avf no bleeding dressing cdi.   ?

## 2021-11-26 NOTE — Progress Notes (Addendum)
?PROGRESS NOTE ? ?Tina Gomez  IPJ:[id number] DOB: 02-Dec-1955 DOA: 11/21/2021 ?PCP: Charlott Rakes, MD  ? ?Brief Narrative: ?Tina Gomez is a 66 y.o. female with medical history significant for ESRD on MWFSa HD, COPD, chronic respiratory failure with hypoxia on 3-4 L supplemental O2 via [place], history of CVA, CAD, HFrEF (EF 30% 11/08/2020), diet-controlled T2DM, HTN, HLD, depression/anxiety, tobacco use who is admitted with bilateral lower limb ischemia.  Started on IV heparin and seen by vascular surgery. Aortogram performed with lower extremity runoff confirms severe PAD bilaterally. Recommendations have included strong consideration for transition to hospice care given her elevated risk of mortality/morbidity with operative management. The patient has been clear that she does not desire transition to comfort care nor amputation, but wants to proceed with revascularization. Cardiology has provided risk assessment with no recommendations for further work up. Vascular surgery is planning staged, likely right then left, femoral endarterectomies and fem-pop bypasses for critical limb ischemia.  ? ?Assessment & Plan: ? ?Principal Problem: ?  Critical limb ischemia of both lower extremities ([place]) ?Active Problems: ?  ESRD on hemodialysis ([place]) ?  Chronic systolic CHF (congestive heart failure) ([place]) ?  Chronic respiratory failure with hypoxia (HCC) ?  COPD (chronic obstructive pulmonary disease) ([place]) ?  Essential hypertension, benign ?  Type 2 diabetes mellitus with diabetic neuropathy, unspecified ([place]) ?  CAD (coronary artery disease) ?  Hyperlipidemia ?  Anxiety and depression ? ? ?Assessment and Plan: ?* Critical limb ischemia of both lower extremities ([place]) ?Vascular surgery, Dr. Stanford Breed, performed aortogram/bilateral lower extremity runoff 3/17 revealing severe PAD, bilateral lower extremity critical limb ischemia.  ?- Continue aspirin and atorvastatin, since tolerating, augmented to 80mg . ?- Will need bilateral  fem-pop bypass, timing TBD per VVS.  ?- Pain is uncontrolled. Will augment dilaudid, emphasize oral dosing to last longer, prn breakthrough IV doses also ordered. Palliative care may be of assistance with symptom management and an additional voice to assist patient understanding of alternative treatment pathway including focus only on comfort. ? ?ESRD on hemodialysis ([place]) ?Dialyzes MWFS ?- Nephro following for dialysis ? ?Chronic systolic CHF (congestive heart failure) ([place]) ?Stable without evidence of volume overload.  Last EF 30% 11/08/2020. Stable on recheck 11/24/2021. ?- Continue Coreg, losartan.  ?- Volume managed with dialysis. ? ?COPD (chronic obstructive pulmonary disease) ([place]) ?Quiescent ?-Continue Spiriva and albuterol as needed ? ?Chronic respiratory failure with hypoxia (HCC) ?Remains at baseline. ? ?Essential hypertension, benign ?Resume home Coreg and losartan. ? ?Type 2 diabetes mellitus with diabetic neuropathy, unspecified ([place]) ?Diet controlled.  Placed on very sensitive SSI while in hospital. Remains at inpatient goal.  ? ?CAD (coronary artery disease) ?Stable. No new WMA on echo. Recent cath with 70% RCA stenosis managed medically without other obstructive disease. No anginal complaints since that time.  ?- Continue aspirin, coreg, and atorvastatin. ?- No further work up currently recommended per cardiology pre-operative risk assessment consultation on 3/19.  ? ?Hyperlipidemia ?LDL is 102.  ?- Augment atorvastatin. ? ?Anxiety and depression ?Continue Prozac and hydroxyzine as needed. ? ? ?DVT prophylaxis:heparin injection 5,000 Units Start: 11/23/21 2200 ?SCD's Start: 11/23/21 1528 ? ?  Code Status: DNR ? ?Family Communication: Daughter daily ? ?Patient status:inpatient ? ?Patient is from :home ? ?Anticipated discharge BH:ALPF ? ?Estimated DC date: After vascular surgery clearance ? ? ?Consultants: Vascular surgery, nephrology, palliative care ? ?Procedures: TBD ? ?Subjective: ?Pain in soles of  feet was worse last night despite IV dilaudid, has tapering effect toward end of dose and  relief is not as significant when she gets it as it was previously. No chest pain or dyspnea.  ? ?Objective: ?Vitals:  ? 11/26/21 0900 11/26/21 0930 11/26/21 1000 11/26/21 1030  ?BP: (!) 157/73 (!) 143/64 (!) 100/54 (!) 92/47  ?Pulse: (!) 56 (!) 55 (!) 53 (!) 52  ?Resp:      ?Temp:      ?TempSrc:      ?SpO2:      ?Weight:      ?Height:      ? ?No intake or output data in the 24 hours ending 11/26/21 1055 ? ?Filed Weights  ? 11/25/21 0508 11/26/21 0435 11/26/21 0831  ?Weight: 78.3 kg 76 kg 86.5 kg  ? ? ?Examination: ?Gen: 66 y.o. female in some discomfort evident while receiving HD ?Pulm: Nonlabored breathing supplemental oxygen. Clear anterolaterally. ?CV: Regular bradycardia without new murmur, rub, or gallop. No JVD, minimal dependent edema. ?GI: Abdomen soft, non-tender, non-distended, with normoactive bowel sounds.  ?Ext: Warm, no deformities. Diminished pulses stable, diminished cap refill.  ?Skin: No ned rashes, lesions or ulcers on visualized skin. ?Neuro: Alert and oriented. No focal neurological deficits. ?Psych: Judgement and insight appear normal. Mood euthymic & affect congruent. Behavior is appropriate.   ? ? ?Data Reviewed: I have personally reviewed following labs and imaging studies ? ?CBC: ?Recent Labs  ?Lab 11/21/21 ?1842 11/22/21 ?5638 11/23/21 ?7564 11/24/21 ?3329 11/25/21 ?0206 11/26/21 ?0155  ?WBC 6.5 6.2 6.8 7.1 6.5 6.2  ?NEUTROABS 4.2  --   --   --   --   --   ?HGB 12.4 10.8* 11.2* 10.2* 9.6* 9.4*  ?HCT 40.5 36.9 37.3 34.3* 32.3* 31.9*  ?MCV 90.4 90.9 89.9 90.3 91.8 92.7  ?PLT 232 200 219 234 187 179  ? ?Basic Metabolic Panel: ?Recent Labs  ?Lab 11/21/21 ?1842 11/22/21 ?5188 11/23/21 ?4166 11/23/21 ?1924 11/24/21 ?0630 11/26/21 ?0155  ?NA 136 137 134*  --  134* 135  ?K 3.4* 3.4* 4.7  --  4.8 4.4  ?CL 95* 94* 94*  --  94* 99  ?CO2 27 26 25   --  24 25  ?GLUCOSE 97 141* 113*  --  87 137*  ?BUN 29* 34* 56*   --  65* 52*  ?CREATININE 4.14* 4.70* 7.00* 7.69* 8.09* 7.19*  ?CALCIUM 9.0 8.7* 9.0  --  9.0 8.5*  ?PHOS  --   --   --   --  9.4* 6.9*  ? ? ? ?Recent Results (from the past 240 hour(s))  ?Resp Panel by RT-PCR (Flu A&B, Covid) Nasopharyngeal Swab     Status: None  ? Collection Time: 11/21/21 10:41 PM  ? Specimen: Nasopharyngeal Swab; Nasopharyngeal(NP) swabs in vial transport medium  ?Result Value Ref Range Status  ? SARS Coronavirus 2 by RT PCR NEGATIVE NEGATIVE Final  ?  Comment: (NOTE) ?SARS-CoV-2 target nucleic acids are NOT DETECTED. ? ?The SARS-CoV-2 RNA is generally detectable in upper respiratory ?specimens during the acute phase of infection. The lowest ?concentration of SARS-CoV-2 viral copies this assay can detect is ?138 copies/mL. A negative result does not preclude SARS-Cov-2 ?infection and should not be used as the sole basis for treatment or ?other patient management decisions. A negative result may occur with  ?improper specimen collection/handling, submission of specimen other ?than nasopharyngeal swab, presence of viral mutation(s) within the ?areas targeted by this assay, and inadequate number of viral ?copies(<138 copies/mL). A negative result must be combined with ?clinical observations, patient history, and epidemiological ?information. The expected result is Negative. ? ?  Fact Sheet for Patients:  ?EntrepreneurPulse.com.au ? ?Fact Sheet for Healthcare Providers:  ?IncredibleEmployment.be ? ?This test is no t yet approved or cleared by the [place] FDA and  ?has been authorized for detection and/or diagnosis of SARS-CoV-2 by ?FDA under an Emergency Use Authorization (EUA). This EUA will remain  ?in effect (meaning this test can be used) for the duration of the ?COVID-19 declaration under Section 564(b)(1) of the Act, 21 ?U.S.C.section 360bbb-3(b)(1), unless the authorization is terminated  ?or revoked sooner.  ? ? ?  ? Influenza A by PCR NEGATIVE NEGATIVE  Final  ? Influenza B by PCR NEGATIVE NEGATIVE Final  ?  Comment: (NOTE) ?The Xpert Xpress SARS-CoV-2/FLU/RSV plus assay is intended as an aid ?in the diagnosis of influenza from Nasopharyngeal swab spec

## 2021-11-26 NOTE — Progress Notes (Addendum)
?Progress Note ? ? ? ?11/26/2021 ?8:47 AM ?3 Days Post-Op ? ?Subjective: Severe right foot pain overnight.  She is up eating breakfast at the bedside with no pain currently. ? ? ?Vitals:  ? 11/26/21 0831 11/26/21 7893  ?BP: (!) 165/75 (!) 157/78  ?Pulse: (!) 57 (!) 58  ?Resp:    ?Temp: 98.4 ?F (36.9 ?C)   ?SpO2: 99% 97%  ? ?Physical Exam: ?Lungs: Nonlabored ?Extremities: No palpable pedal pulses ?Neurologic: A&O ? ?CBC ?   ?Component Value Date/Time  ? WBC 6.2 11/26/2021 0155  ? RBC 3.44 (L) 11/26/2021 0155  ? HGB 9.4 (L) 11/26/2021 0155  ? HGB 9.5 (L) 02/06/2021 8101  ? HCT 31.9 (L) 11/26/2021 0155  ? HCT 31.3 (L) 02/06/2021 7510  ? PLT 179 11/26/2021 0155  ? PLT 299 02/06/2021 0927  ? MCV 92.7 11/26/2021 0155  ? MCV 93 02/06/2021 0927  ? MCH 27.3 11/26/2021 0155  ? MCHC 29.5 (L) 11/26/2021 0155  ? RDW 16.7 (H) 11/26/2021 0155  ? RDW 15.8 (H) 02/06/2021 2585  ? LYMPHSABS 1.5 11/21/2021 1842  ? LYMPHSABS 1.6 02/06/2021 0927  ? MONOABS 0.6 11/21/2021 1842  ? EOSABS 0.1 11/21/2021 1842  ? EOSABS 0.2 02/06/2021 0927  ? BASOSABS 0.0 11/21/2021 1842  ? BASOSABS 0.0 02/06/2021 2778  ? ? ?BMET ?   ?Component Value Date/Time  ? NA 135 11/26/2021 0155  ? NA 141 11/24/2020 1304  ? K 4.4 11/26/2021 0155  ? CL 99 11/26/2021 0155  ? CO2 25 11/26/2021 0155  ? GLUCOSE 137 (H) 11/26/2021 0155  ? BUN 52 (H) 11/26/2021 0155  ? BUN 47 (H) 11/24/2020 1304  ? CREATININE 7.19 (H) 11/26/2021 0155  ? CREATININE 1.25 (H) 11/13/2016 1018  ? CALCIUM 8.5 (L) 11/26/2021 0155  ? CALCIUM 5.8 (LL) 07/24/2020 0343  ? GFRNONAA 6 (L) 11/26/2021 0155  ? GFRNONAA 47 (L) 11/13/2016 1018  ? GFRAA 13 (L) 06/15/2020 1134  ? GFRAA 54 (L) 11/13/2016 1018  ? ? ?INR ?   ?Component Value Date/Time  ? INR 1.2 11/21/2021 2010  ? ? ? ?Intake/Output Summary (Last 24 hours) at 11/26/2021 0847 ?Last data filed at 11/25/2021 0900 ?Gross per 24 hour  ?Intake 100 ml  ?Output 1 ml  ?Net 99 ml  ? ? ? ?Assessment/Plan:  66 y.o. female with bilateral lower extremity rest pain  3 Days Post-Op  ? ?Patient is aware she would require staged bilateral iliofemoral endarterectomies and femoral to popliteal bypass given her bilateral lower extremity rest pain.  No further work-up recommended by cardiology.  Patient is aware she carries a moderate to high operative risk given her comorbidities.  She is unable to live with her current symptoms and would like to proceed with operative management.  We will likely address the right leg first as this is the more symptomatic side.  We will provide her with a date for surgery as soon as possible. ? ? ?Dagoberto Ligas, PA-C ?[facility] ?[phone] ?11/26/2021 ?8:47 AM ? ?VASCULAR STAFF ADDENDUM: ?I have independently interviewed and examined the patient. ?I agree with the above.  ?Difficult situation. ?Patient has debilitating pain from ischemic rest pain. ?Only option for treatment rest pain would be open vascular surgery. ?She has high physiologic risk to vascular surgery, and would likely suffer some form of complication.  I think she would likely discharge to a skilled nursing facility, and may not return home. ?I reviewed options with her daughter. ?1 option would be aggressive therapy with  femoral-popliteal bypass, understanding that she is at very high operative risk. ?Another option would be transitioning to comfort measures only.  Her daughter feels this would be appropriate, but is not sure her mom is ready to pursue this yet. ?The patient is in dialysis while I was rounding.  I will follow-up with her tomorrow to discuss the above.  I have her tentatively posted for a femoral-popliteal bypass on Wednesday. ? ?[name]. Stanford Breed, MD ?[facility] ?Office Phone Number: [phone] ?11/26/2021 12:04 PM ? ? ? ?

## 2021-11-27 DIAGNOSIS — I70223 Atherosclerosis of native arteries of extremities with rest pain, bilateral legs: Secondary | ICD-10-CM

## 2021-11-27 DIAGNOSIS — I2510 Atherosclerotic heart disease of native coronary artery without angina pectoris: Secondary | ICD-10-CM

## 2021-11-27 DIAGNOSIS — N186 End stage renal disease: Secondary | ICD-10-CM | POA: Diagnosis not present

## 2021-11-27 DIAGNOSIS — I5022 Chronic systolic (congestive) heart failure: Secondary | ICD-10-CM | POA: Diagnosis not present

## 2021-11-27 DIAGNOSIS — J9611 Chronic respiratory failure with hypoxia: Secondary | ICD-10-CM | POA: Diagnosis not present

## 2021-11-27 LAB — GLUCOSE, CAPILLARY
Glucose-Capillary: 128 mg/dL — ABNORMAL HIGH (ref 70–99)
Glucose-Capillary: 117 mg/dL — ABNORMAL HIGH (ref 70–99)
Glucose-Capillary: 145 mg/dL — ABNORMAL HIGH (ref 70–99)
Glucose-Capillary: 152 mg/dL — ABNORMAL HIGH (ref 70–99)

## 2021-11-27 LAB — RENAL FUNCTION PANEL
Albumin: 2.8 g/dL — ABNORMAL LOW (ref 3.5–5.0)
Anion gap: 11 (ref 5–15)
BUN: 35 mg/dL — ABNORMAL HIGH (ref 8–23)
CO2: 27 mmol/L (ref 22–32)
Calcium: 8.7 mg/dL — ABNORMAL LOW (ref 8.9–10.3)
Chloride: 98 mmol/L (ref 98–111)
Creatinine, Ser: 5.26 mg/dL — ABNORMAL HIGH (ref 0.44–1.00)
GFR, Estimated: 9 mL/min — ABNORMAL LOW (ref >60)
Glucose, Bld: 134 mg/dL — ABNORMAL HIGH (ref 70–99)
Phosphorus: 5.8 mg/dL — ABNORMAL HIGH (ref 2.5–4.6)
Potassium: 4 mmol/L (ref 3.5–5.1)
Sodium: 136 mmol/L (ref 135–145)

## 2021-11-27 LAB — CBC
HCT: 34.3 % — ABNORMAL LOW (ref 36.0–46.0)
Hemoglobin: 9.7 g/dL — ABNORMAL LOW (ref 12.0–15.0)
MCH: 26.4 pg (ref 26.0–34.0)
MCHC: 28.3 g/dL — ABNORMAL LOW (ref 30.0–36.0)
MCV: 93.5 fL (ref 80.0–100.0)
Platelets: 208 10*3/uL (ref 150–400)
RBC: 3.67 MIL/uL — ABNORMAL LOW (ref 3.87–5.11)
RDW: 16.8 % — ABNORMAL HIGH (ref 11.5–15.5)
WBC: 6.7 10*3/uL (ref 4.0–10.5)
nRBC: 0 % (ref 0.0–0.2)

## 2021-11-27 MED ORDER — CEFAZOLIN SODIUM-DEXTROSE 1-4 GM/50ML-% IV SOLN: 1.0000 g | INTRAVENOUS | Status: AC

## 2021-11-27 MED ORDER — HYDROMORPHONE HCL 2 MG PO TABS
4.0000 mg | ORAL_TABLET | ORAL | Status: DC | PRN
  Administered 2021-11-27 – 2021-11-28 (×6): 6 mg via ORAL
  Filled 2021-11-27 (×6): qty 3

## 2021-11-27 NOTE — Anesthesia Preprocedure Evaluation (Addendum)
Anesthesia Evaluation  ?Patient identified by MRN, date of birth, ID band ?Patient awake ? ? ? ?Reviewed: ?Allergy & Precautions, H&P , NPO status , Patient's Chart, lab work & pertinent test results ? ?History of Anesthesia Complications ?(+) PONV and history of anesthetic complications ? ?Airway ?Mallampati: II ? ?TM Distance: >3 FB ?Neck ROM: Full ? ? ? Dental ?no notable dental hx. ?(+) Edentulous Upper, Edentulous Lower, Dental Advisory Given ?  ?Pulmonary ?COPD,  COPD inhaler, Current Smoker and Patient abstained from smoking.,  ?  ?Pulmonary exam normal ?breath sounds clear to auscultation ? ? ? ? ? ? Cardiovascular ?Exercise Tolerance: Good ?hypertension, Pt. on medications and Pt. on home beta blockers ?+ CAD, + Peripheral Vascular Disease and +CHF  ? ?Rhythm:Regular Rate:Normal ? ? ?  ?Neuro/Psych ?Anxiety Depression CVA, Residual Symptoms   ? GI/Hepatic ?negative GI ROS, Neg liver ROS,   ?Endo/Other  ?diabetes ? Renal/GU ?ESRF and DialysisRenal disease  ?negative genitourinary ?  ?Musculoskeletal ? ? Abdominal ?  ?Peds ? Hematology ? ?(+) Blood dyscrasia, anemia ,   ?Anesthesia Other Findings ? ? Reproductive/Obstetrics ?negative OB ROS ? ?  ? ? ? ? ? ? ? ? ? ? ? ? ? ?  ?  ? ? ? ? ? ? ? ?Anesthesia Physical ?Anesthesia Plan ? ?ASA: 3 ? ?Anesthesia Plan: General  ? ?Post-op Pain Management: Ofirmev IV (intra-op)*  ? ?Induction: Intravenous ? ?PONV Risk Score and Plan: 4 or greater and Ondansetron, Dexamethasone and Midazolam ? ?Airway Management Planned:  ? ?Additional Equipment: Arterial line ? ?Intra-op Plan:  ? ?Post-operative Plan: Extubation in OR ? ?Informed Consent: I have reviewed the patients History and Physical, chart, labs and discussed the procedure including the risks, benefits and alternatives for the proposed anesthesia with the patient or authorized representative who has indicated his/her understanding and acceptance.  ? ?Patient has DNR.  ?Discussed DNR  with patient and Suspend DNR. ?  ?Dental advisory given ? ?Plan Discussed with: CRNA ? ?Anesthesia Plan Comments:   ? ? ? ? ?Anesthesia Quick Evaluation ? ?

## 2021-11-27 NOTE — Progress Notes (Addendum)
?Progress Note ? ? ? ?11/27/2021 ?6:59 AM ?4 Days Post-Op ? ?Subjective:  does not want to go to short term rehab after surgery due to losing her SS check. ? ?Afebrile ?HR 50's-60's  ?423'N-361'W systolic ?431% V4MG ? ?Vitals:  ? 11/26/21 2039 11/27/21 0500  ?BP: (!) 171/62 (!) 159/75  ?Pulse: (!) 59 (!) 59  ?Resp: 18 17  ?Temp: 98.4 ?F (36.9 ?C) 98.1 ?F (36.7 ?C)  ?SpO2: 100% 95%  ? ? ?Physical Exam: ?General:  no distress ?Lungs:  non labored ? ? ?CBC ?   ?Component Value Date/Time  ? WBC 6.7 11/27/2021 0303  ? RBC 3.67 (L) 11/27/2021 0303  ? HGB 9.7 (L) 11/27/2021 0303  ? HGB 9.5 (L) 02/06/2021 8676  ? HCT 34.3 (L) 11/27/2021 0303  ? HCT 31.3 (L) 02/06/2021 1950  ? PLT 208 11/27/2021 0303  ? PLT 299 02/06/2021 0927  ? MCV 93.5 11/27/2021 0303  ? MCV 93 02/06/2021 0927  ? MCH 26.4 11/27/2021 0303  ? MCHC 28.3 (L) 11/27/2021 0303  ? RDW 16.8 (H) 11/27/2021 0303  ? RDW 15.8 (H) 02/06/2021 9326  ? LYMPHSABS 1.5 11/21/2021 1842  ? LYMPHSABS 1.6 02/06/2021 0927  ? MONOABS 0.6 11/21/2021 1842  ? EOSABS 0.1 11/21/2021 1842  ? EOSABS 0.2 02/06/2021 0927  ? BASOSABS 0.0 11/21/2021 1842  ? BASOSABS 0.0 02/06/2021 7124  ? ? ?BMET ?   ?Component Value Date/Time  ? NA 136 11/27/2021 0303  ? NA 141 11/24/2020 1304  ? K 4.0 11/27/2021 0303  ? CL 98 11/27/2021 0303  ? CO2 27 11/27/2021 0303  ? GLUCOSE 134 (H) 11/27/2021 0303  ? BUN 35 (H) 11/27/2021 0303  ? BUN 47 (H) 11/24/2020 1304  ? CREATININE 5.26 (H) 11/27/2021 0303  ? CREATININE 1.25 (H) 11/13/2016 1018  ? CALCIUM 8.7 (L) 11/27/2021 0303  ? CALCIUM 5.8 (LL) 07/24/2020 0343  ? GFRNONAA 9 (L) 11/27/2021 0303  ? GFRNONAA 47 (L) 11/13/2016 1018  ? GFRAA 13 (L) 06/15/2020 1134  ? GFRAA 54 (L) 11/13/2016 1018  ? ? ?INR ?   ?Component Value Date/Time  ? INR 1.2 11/21/2021 2010  ? ? ? ?Intake/Output Summary (Last 24 hours) at 11/27/2021 0659 ?Last data filed at 11/26/2021 2030 ?Gross per 24 hour  ?Intake 240 ml  ?Output 1436 ml  ?Net -1196 ml  ? ? ? ?Assessment/Plan:  66 y.o. female  is s/p:  ?Angiogram with runoff via right CFA  ?4 Days Post-Op ? ? ?-pt willing to proceed with surgery tomorrow.  Discussed with pt that surgery is moderate to high risk given co morbidities.  Discussed with her that this is a big surgery and depending on her post operative course would depend on disposition. ?-pt seen by palliative care and pt is now DNR/DNI, however, would like to proceed with surgery to hopefully relieve rest pain as she does not want to have an amputation.   ?-DVT prophylaxis:  sq heparin ?-npo/consent/labs ordered ?-please do not dialyze pt on 11/28/2021 as she is scheduled for right femoral to popliteal bypass grafting with femoral endarterectomy. ? ? ?Leontine Locket, PA-C ?[facility] ?[phone] ?11/27/2021 ?6:59 AM ? ?VASCULAR STAFF ADDENDUM: ?I have independently interviewed and examined the patient. ?I agree with the above.  ?Long discussion with patient about risks / benefits / alternatives. ?She is understanding of her options, and wants aggressive surgical therapy. ?She is clear in her wishes to avoid tracheostomy and disposition to skilled nursing facility.  ?Plan R fem-pop  bypass with femoral endarterectomy tomorrow in OR. ?NPO after midnight.  ? ?[name]. Stanford Breed, MD ?[facility] [place] ?Office Phone Number: [phone] ?11/27/2021 2:09 PM ? ? ? ?

## 2021-11-27 NOTE — Progress Notes (Signed)
?PROGRESS NOTE ? ?MYELLE Gomez  ZMO:[id number] DOB: 1956-05-26 DOA: 11/21/2021 ?PCP: Charlott Rakes, MD  ? ?Brief Narrative: ?Tina Gomez is a 66 y.o. 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  Started on IV heparin and seen by vascular surgery. Aortogram performed with lower extremity runoff confirms severe PAD bilaterally. Recommendations have included strong consideration for transition to hospice care given her elevated risk of mortality/morbidity with operative management. The patient has been clear that she does not desire transition to comfort care nor amputation, but wants to proceed with revascularization. Cardiology has provided risk assessment with no recommendations for further work up. Vascular surgery is planning staged, likely right then left, femoral endarterectomies and fem-pop bypasses for critical limb ischemia.  ? ?Assessment & Plan: ? ?Principal Problem: ?  Critical limb ischemia of both lower extremities ([place]) ?Active Problems: ?  ESRD on hemodialysis ([place]) ?  Chronic systolic CHF (congestive heart failure) ([place]) ?  Chronic respiratory failure with hypoxia ([facility]) ?  COPD (chronic obstructive pulmonary disease) ([place]) ?  Essential hypertension, benign ?  Type 2 diabetes mellitus with diabetic neuropathy, unspecified ([place]) ?  CAD (coronary artery disease) ?  Hyperlipidemia ?  Anxiety and depression ? ? ?Assessment and Plan: ?* Critical limb ischemia of both lower extremities ([place]) ?Vascular surgery, Dr. Stanford Breed, performed aortogram/bilateral lower extremity runoff 3/17 revealing severe PAD, bilateral lower extremity critical limb ischemia.  ?- Continue aspirin and atorvastatin, since tolerating, augmented to 80mg . ?- Will need bilateral  fem-pop bypass, timing of first procedure 3/22 per VVS.  ?- Pain is uncontrolled. Pt prefers IV dilaudid but is tolerating oral medications without issue. Rationale for preference of PO vs. IV sedating medications in such a high risk patient was discussed with her and her daughter. Will increase dose of oral dilaudid today. Appreciate palliative care consultation. Pt now DNR, continues to desire surgery, not comfort measures. ? ?ESRD on hemodialysis ([place]) ?Dialyzes MWFS ?- Nephro following for dialysis ? ?Chronic systolic CHF (congestive heart failure) ([place]) ?Stable without evidence of volume overload.  Last EF 30% 11/08/2020. Stable on recheck 11/24/2021. ?- Continue Coreg, losartan.  ?- Volume managed with dialysis. ? ?COPD (chronic obstructive pulmonary disease) ([place]) ?Quiescent ?-Continue Spiriva and albuterol as needed ? ?Chronic respiratory failure with hypoxia ([facility]) ?Remains at baseline. ? ?Essential hypertension, benign ?Resume home Coreg and losartan. ? ?Type 2 diabetes mellitus with diabetic neuropathy, unspecified ([place]) ?Diet controlled.  Placed on very sensitive SSI while in hospital. Remains at inpatient goal.  ? ?CAD (coronary artery disease) ?Stable. No new WMA on echo. Recent cath with 70% RCA stenosis managed medically without other obstructive disease. No anginal complaints since that time.  ?- Continue aspirin, coreg, and atorvastatin. ?- No further work up currently recommended per cardiology pre-operative risk assessment consultation on 3/19.  ? ?Hyperlipidemia ?LDL is 102.  ?- Augment atorvastatin. ? ?Anxiety and depression ?Continue Prozac and hydroxyzine as needed. ? ? ?DVT prophylaxis:heparin injection 5,000 Units Start: 11/23/21 2200 ?SCD's Start: 11/23/21 1528 ? ?  Code Status: DNR ? ?Family Communication: Daughter daily ? ?Patient status:inpatient ? ?Patient is from :home ? ?Anticipated discharge to: SNF ? ?Estimated DC date: After vascular surgery clearance ? ?Consultants: Vascular surgery,  nephrology, cardiology, palliative care ? ?Procedures: TBD ? ?Subjective: ?Pain in feet  is severe, stable, constant, improved with dilaudid (prefers IV over PO). No new issues or chest pain.  ? ?Objective: ?Vitals:  ? 11/26/21 1300 11/26/21 2039 11/27/21 0500 11/27/21 0937  ?BP: (!) 181/57 (!) 171/62 (!) 159/75 (!) 179/63  ?Pulse: 62 (!) 59 (!) 59 (!) 57  ?Resp: 16 18 17    ?Temp: (!) 97.5 ?F (36.4 ?C) 98.4 ?F (36.9 ?C) 98.1 ?F (36.7 ?C)   ?TempSrc: Oral Oral Oral   ?SpO2: 98% 100% 95%   ?Weight:   78.4 kg   ?Height:      ? ? ?Intake/Output Summary (Last 24 hours) at 11/27/2021 1138 ?Last data filed at 11/27/2021 1103 ?Gross per 24 hour  ?Intake 390 ml  ?Output 1435 ml  ?Net -1045 ml  ? ? ?Filed Weights  ? 11/26/21 0831 11/26/21 1143 11/27/21 0500  ?Weight: 86.5 kg 83.8 kg 78.4 kg  ? ? ?Examination: ?Gen: 66 y.o. female in no distress ?Pulm: Nonlabored breathing room air. Clear. ?CV: Regular rate and rhythm. No murmur, rub, or gallop. No JVD, no dependent edema. ?GI: Abdomen soft, non-tender, non-distended, with normoactive bowel sounds.  ?Ext: Warm, no deformities, dry with nearly absent DP pulses. Painful sensations independent of palpation on feet. ?Skin: No new rashes, lesions or ulcers on visualized skin. ?Neuro: Alert and oriented. No focal neurological deficits. ?Psych: Judgement and insight appear fair. Mood euthymic & affect congruent. Behavior is appropriate.   ? ? ?Data Reviewed: I have personally reviewed following labs and imaging studies ? ?CBC: ?Recent Labs  ?Lab 11/21/21 ?1842 11/22/21 ?8366 11/23/21 ?2947 11/24/21 ?6546 11/25/21 ?0206 11/26/21 ?0155 11/27/21 ?0303  ?WBC 6.5   < > 6.8 7.1 6.5 6.2 6.7  ?NEUTROABS 4.2  --   --   --   --   --   --   ?HGB 12.4   < > 11.2* 10.2* 9.6* 9.4* 9.7*  ?HCT 40.5   < > 37.3 34.3* 32.3* 31.9* 34.3*  ?MCV 90.4   < > 89.9 90.3 91.8 92.7 93.5  ?PLT 232   < > 219 234 187 179 208  ? < > = values in this interval not displayed.  ? ?Basic Metabolic Panel: ?Recent Labs  ?Lab  11/22/21 ?0336 11/23/21 ?5035 11/23/21 ?1924 11/24/21 ?4656 11/26/21 ?0155 11/27/21 ?0303  ?NA 137 134*  --  134* 135 136  ?K 3.4* 4.7  --  4.8 4.4 4.0  ?CL 94* 94*  --  94* 99 98  ?CO2 26 25  --  24 25 27   ?GLUCOSE 141* 113*  --  87 137* 134*  ?BUN 34* 56*  --  65* 52* 35*  ?CREATININE 4.70* 7.00* 7.69* 8.09* 7.19* 5.26*  ?CALCIUM 8.7* 9.0  --  9.0 8.5* 8.7*  ?PHOS  --   --   --  9.4* 6.9* 5.8*  ? ? ? ?Recent Results (from the past 240 hour(s))  ?Resp Panel by RT-PCR (Flu A&B, Covid) Nasopharyngeal Swab     Status: None  ? Collection Time: 11/21/21 10:41 PM  ? Specimen: Nasopharyngeal Swab; Nasopharyngeal(NP) swabs in vial transport medium  ?Result Value Ref Range Status  ? SARS Coronavirus 2 by RT PCR NEGATIVE NEGATIVE Final  ?  Comment: (NOTE) ?SARS-CoV-2 target nucleic acids are NOT DETECTED. ? ?The SARS-CoV-2 RNA is generally detectable in upper respiratory ?specimens during the acute phase of infection. The lowest ?concentration of SARS-CoV-2 viral copies this assay can detect is ?138 copies/mL. A negative result does not preclude SARS-Cov-2 ?infection and should not be  used as the sole basis for treatment or ?other patient management decisions. A negative result may occur with  ?improper specimen collection/handling, submission of specimen other ?than nasopharyngeal swab, presence of viral mutation(s) within the ?areas targeted by this assay, and inadequate number of viral ?copies(<138 copies/mL). A negative result must be combined with ?clinical observations, patient history, and epidemiological ?information. The expected result is Negative. ? ?Fact Sheet for Patients:  ?EntrepreneurPulse.com.au ? ?Fact Sheet for Healthcare Providers:  ?IncredibleEmployment.be ? ?This test is no t yet approved or cleared by the [place] FDA and  ?has been authorized for detection and/or diagnosis of SARS-CoV-2 by ?FDA under an Emergency Use Authorization (EUA). This EUA will remain   ?in effect (meaning this test can be used) for the duration of the ?COVID-19 declaration under Section 564(b)(1) of the Act, 21 ?U.S.C.section 360bbb-3(b)(1), unless the authorization is terminated  ?or

## 2021-11-27 NOTE — Plan of Care (Signed)

## 2021-11-27 NOTE — Progress Notes (Signed)
?Tina Gomez ?Progress Note  ? ?Subjective: Seen in room. Family present. Going to OR tomorrow. Will hold HD tomorrow, resume HD 11/29/2021. No C/Os.    ? ?Objective ?Vitals:  ? 11/26/21 1300 11/26/21 2039 11/27/21 0500 11/27/21 0937  ?BP: (!) 181/57 (!) 171/62 (!) 159/75 (!) 179/63  ?Pulse: 62 (!) 59 (!) 59 (!) 57  ?Resp: 16 18 17    ?Temp: (!) 97.5 ?F (36.4 ?C) 98.4 ?F (36.9 ?C) 98.1 ?F (36.7 ?C)   ?TempSrc: Oral Oral Oral   ?SpO2: 98% 100% 95%   ?Weight:   78.4 kg   ?Height:      ? ?Physical Exam ?General: Chronically ill appearing female in NAD ?Heart: S1,S2 RRR No M/R/G ?Lungs: CTAB ?Abdomen: NABS ?Extremities: No LE edema ?Dialysis Access: L AVF + T/B ? ? ?Additional Objective ?Labs: ?Basic Metabolic Panel: ?Recent Labs  ?Lab 11/24/21 ?0318 11/26/21 ?0155 11/27/21 ?0303  ?NA 134* 135 136  ?K 4.8 4.4 4.0  ?CL 94* 99 98  ?CO2 24 25 27   ?GLUCOSE 87 137* 134*  ?BUN 65* 52* 35*  ?CREATININE 8.09* 7.19* 5.26*  ?CALCIUM 9.0 8.5* 8.7*  ?PHOS 9.4* 6.9* 5.8*  ? ?Liver Function Tests: ?Recent Labs  ?Lab 11/21/21 ?1842 11/24/21 ?6734 11/26/21 ?0155 11/27/21 ?0303  ?AST 18  --   --   --   ?ALT 10  --   --   --   ?ALKPHOS 73  --   --   --   ?BILITOT 0.7  --   --   --   ?PROT 7.1  --   --   --   ?ALBUMIN 3.1* 2.8* 2.7* 2.8*  ? ?No results for input(s): LIPASE, AMYLASE in the last 168 hours. ?CBC: ?Recent Labs  ?Lab 11/21/21 ?1842 11/22/21 ?1937 11/23/21 ?9024 11/24/21 ?0973 11/25/21 ?0206 11/26/21 ?0155 11/27/21 ?0303  ?WBC 6.5   < > 6.8 7.1 6.5 6.2 6.7  ?NEUTROABS 4.2  --   --   --   --   --   --   ?HGB 12.4   < > 11.2* 10.2* 9.6* 9.4* 9.7*  ?HCT 40.5   < > 37.3 34.3* 32.3* 31.9* 34.3*  ?MCV 90.4   < > 89.9 90.3 91.8 92.7 93.5  ?PLT 232   < > 219 234 187 179 208  ? < > = values in this interval not displayed.  ? ?Blood Culture ?   ?Component Value Date/Time  ? SDES EXPECTORATED SPUTUM 09/13/2021 0459  ? SDES EXPECTORATED SPUTUM 09/13/2021 0459  ? [place] NONE 09/13/2021 0459  ? SPECREQUEST NONE Reflexed  from [id number] 09/13/2021 0459  ? CULT  09/13/2021 0459  ?  FEW Normal respiratory flora-no Staph aureus or Pseudomonas seen ?Performed at [facility], [place] [address], [place], [place] [address] ?  ? REPTSTATUS 09/13/2021 FINAL 09/13/2021 0459  ? REPTSTATUS 09/15/2021 FINAL 09/13/2021 0459  ? ? ?Cardiac Enzymes: ?No results for input(s): CKTOTAL, CKMB, CKMBINDEX, TROPONINI in the last 168 hours. ?CBG: ?Recent Labs  ?Lab 11/26/21 ?1255 11/26/21 ?1655 11/26/21 ?2120 11/27/21 ?2426 11/27/21 ?1123  ?GLUCAP 119* 157* 181* 128* 117*  ? ?Iron Studies: No results for input(s): IRON, TIBC, TRANSFERRIN, FERRITIN in the last 72 hours. ?@lablastinr3 @ ?Studies/Results: ?No results found. ?Medications: ? sodium chloride    ? [START ON 11/28/2021]  ceFAZolin (ANCEF) IV    ? [START ON 11/28/2021] iron sucrose    ? ? aspirin  325 mg Oral Daily  ? atorvastatin  80 mg Oral q1800  ?  carvedilol  12.5 mg Oral QHS  ? And  ? carvedilol  12.5 mg Oral Once per day on Sun Tue Thu  ? darbepoetin (ARANESP) injection - DIALYSIS  25 mcg Intravenous Q Mon-HD  ? doxercalciferol  1 mcg Intravenous Q M,W,F-HD  ? FLUoxetine  20 mg Oral QHS  ? gabapentin  200 mg Oral QHS  ? heparin  5,000 Units Subcutaneous Q8H  ? insulin aspart  0-6 Units Subcutaneous TID WC  ? losartan  100 mg Oral Daily  ? multivitamin  1 tablet Oral QHS  ? sodium chloride flush  3 mL Intravenous Q12H  ? sodium chloride flush  3 mL Intravenous Q12H  ? sucroferric oxyhydroxide  500 mg Oral TID WC  ? ? ? ?Dialysis Orders: ?Andree Elk form MWFS 3 hours EDW 75 kg ?3K, 2.5 CA bath UF P2 ?Heparin 5000 units IV Initial bolus 2000 units IV mid run ?Hectorol 1 mcg IV TIW ?Venofer 50 mg IV weekly ?  ?  ?Assessment/Plan: ?Bilateral lower extremity critical limb extremity ischemia-Followed by VVS. Needs staged bilateral femoral endarterectomies and FPB. Cardiology has cleared patient for procedure. Per primary, VVS.  ?ESRD -HD MWFS. Going to OR 11/28/2021. Next HD 03/23 off schedule the again  11/30/2021 to get back on schedule.  ?Hypertension/volume  -No evidence of volume overload by exam. HD 11/27/2021: Pre wt 86.5 kg Net UF 1435 cc Post Op 83.8 kg.  UF as tolerated.  ?Anemia  -Hgb variable-now 9.7 today. Gradual decline. Start Aranesp 25 mcg IV weekly. Continue weekly Venofer. Follow HBG.   ?Metabolic bone disease -Continue binders, VDRA.  ?History of HFrEF (last EF 30-35% with G1DD 11/24/2021). Optimize volume with HD. Reminded patient to adhere to fluid restrictions.  ?COPD/tobacco abuse-Meds per primary ?DM type II -Per primary.  ?Nutrition -ALB low.  diet renal/carb modified renal with fluid restriction renal vitamin protein supplement. ?History of anxiety/depression- Per primary ?  ? ?Tina Calbert H. Shirlie Enck NP-C ?11/27/2021, 12:49 PM  ?[facility] ?[phone] ? ? ?  ? ?

## 2021-11-27 NOTE — Progress Notes (Signed)
? ?                                                                                                                                                     ?                                                   ?Daily Progress Note  ? ?Patient Name: Tina Gomez       Date: 11/27/2021 ?DOB: 1955/12/29  Age: 66 y.o. MRN#: [id number] ?Attending Physician: Patrecia Pour, MD ?Primary Care Physician: Charlott Rakes, MD ?Admit Date: 11/21/2021 ? ? ? ?HPI/Patient Profile: 66 y.o. 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 She presented to the ED on on 11/21/2021 with pain in both feet for 2 weeks that had worsened over the past 2 days. Patient also reported cold sensation and discoloration to both feet. ABIs showed moderate RLE arterial disease and critical left limb ischemia. Admitted to [facility].  ? ?Subjective: ?Chart reviewed. Note plan for right endarterectomy and femoral-popliteal bypass with graft tomorrow.  ? ?I went to bedside to see patient. She is resting comfortably. Her daughter Tina Gomez is at bedside and reports her RLE pain has been more controlled today after an adjustment in medication. Created space and opportunity for daughter to express thoughts and feelings regarding patient's current medical situation.  ?Emotional support provided ?  ? ?Objective: ? ?Physical Exam ?Vitals reviewed.  ?Constitutional:   ?   General: She is sleeping. She is not in acute distress. ?   Appearance: She is ill-appearing.  ?Pulmonary:  ?   Effort: Pulmonary effort is normal.  ?         ? ?Vital Signs: BP (!) 121/58 (BP Location: Left Arm)   Pulse 67   Temp (!) 97.5 ?F (36.4 ?C) (Axillary)   Resp (!) 21   Ht 5\' 2"  (1.575 m)   Wt 78.4 kg   LMP  (LMP Unknown)   SpO2 100%   BMI 31.61 kg/m?  ?SpO2: SpO2: 100 % ?O2 Device: O2 Device: Nasal Cannula ?O2  Flow Rate: O2 Flow Rate (L/min): 2 L/min ? ? ?LBM: Last BM Date : 11/27/21 ?Baseline Weight: Weight: 77.1 kg ?Most recent weight: Weight: 78.4 kg ? ?     ?Palliative Assessment/Data: PPS 30% ? ? ? ? ?Palliative Care Assessment & Plan  ? ?Assessment: ?- critical limb ischemia of both lower extremities ?- ESRD on HD ?- chronic systolic CHF ?- chronic respiratory failure with hypoxia ?- COPD ? ?  Recommendations/Plan: ?DNR/DNI as previously documented ?Patient is very realistic and understands her overall prognosis is poor ?She confirms desire to proceed with surgical management and is hopeful it will result in decreased foot pain ?PMT will continue to follow and support ? ? ?Scope of Treatment (per current MOST form): ? ?Cardiopulmonary Resuscitation: Do Not Attempt Resuscitation (DNR/No CPR)  ?Medical Interventions: Limited Additional Interventions: Use medical treatment, IV fluids and cardiac monitoring as indicated, DO NOT USE intubation or mechanical ventilation. May consider use of less invasive airway support such as BiPAP or CPAP. Also provide comfort measures. Transfer to the hospital if indicated. Avoid intensive care.   ?Antibiotics: Antibiotics if indicated  ?IV Fluids: IV fluids for a defined trial period  ?Feeding Tube: No feeding tube  ? ? ? ?Prognosis: ? Unable to determine ? ?Discharge Planning: ?To Be Determined ? ? ? ?Thank you for allowing the Palliative Medicine Team to assist in the care of this patient. ? ?MDM - moderate ? ?Lavena Bullion, NP ? ?Please contact Palliative Medicine Team phone at [phone] for questions and concerns.  ? ? ? ? ? ?

## 2021-11-28 ENCOUNTER — Inpatient Hospital Stay (HOSPITAL_COMMUNITY): Payer: Medicare Other

## 2021-11-28 ENCOUNTER — Other Ambulatory Visit: Payer: Self-pay

## 2021-11-28 ENCOUNTER — Inpatient Hospital Stay (HOSPITAL_COMMUNITY): Payer: Medicare Other | Admitting: Anesthesiology

## 2021-11-28 ENCOUNTER — Encounter (HOSPITAL_COMMUNITY)
Admission: EM | Disposition: A | Discharge status: Hospice | Payer: Self-pay | Source: Home / Self Care | Attending: Family Medicine

## 2021-11-28 DIAGNOSIS — I998 Other disorder of circulatory system: Secondary | ICD-10-CM | POA: Diagnosis not present

## 2021-11-28 DIAGNOSIS — I6990 Unspecified sequelae of unspecified cerebrovascular disease: Secondary | ICD-10-CM

## 2021-11-28 DIAGNOSIS — I70221 Atherosclerosis of native arteries of extremities with rest pain, right leg: Secondary | ICD-10-CM | POA: Diagnosis not present

## 2021-11-28 DIAGNOSIS — J449 Chronic obstructive pulmonary disease, unspecified: Secondary | ICD-10-CM

## 2021-11-28 DIAGNOSIS — I70223 Atherosclerosis of native arteries of extremities with rest pain, bilateral legs: Secondary | ICD-10-CM | POA: Diagnosis not present

## 2021-11-28 DIAGNOSIS — Z95828 Presence of other vascular implants and grafts: Secondary | ICD-10-CM

## 2021-11-28 HISTORY — PX: FEMORAL-POPLITEAL BYPASS GRAFT: SHX937

## 2021-11-28 HISTORY — PX: PATCH ANGIOPLASTY: SHX6230

## 2021-11-28 HISTORY — PX: ENDARTERECTOMY FEMORAL: SHX5804

## 2021-11-28 HISTORY — PX: INTRAOPERATIVE ARTERIOGRAM: SHX5157

## 2021-11-28 LAB — CBC
HCT: 31.5 % — ABNORMAL LOW (ref 36.0–46.0)
HCT: 24.1 % — ABNORMAL LOW (ref 36.0–46.0)
Hemoglobin: 9.2 g/dL — ABNORMAL LOW (ref 12.0–15.0)
Hemoglobin: 7 g/dL — ABNORMAL LOW (ref 12.0–15.0)
MCH: 26.8 pg (ref 26.0–34.0)
MCH: 27.1 pg (ref 26.0–34.0)
MCHC: 29.2 g/dL — ABNORMAL LOW (ref 30.0–36.0)
MCHC: 29 g/dL — ABNORMAL LOW (ref 30.0–36.0)
MCV: 91.8 fL (ref 80.0–100.0)
MCV: 93.4 fL (ref 80.0–100.0)
Platelets: 207 10*3/uL (ref 150–400)
Platelets: 212 10*3/uL (ref 150–400)
RBC: 3.43 MIL/uL — ABNORMAL LOW (ref 3.87–5.11)
RBC: 2.58 MIL/uL — ABNORMAL LOW (ref 3.87–5.11)
RDW: 16.8 % — ABNORMAL HIGH (ref 11.5–15.5)
RDW: 17.1 % — ABNORMAL HIGH (ref 11.5–15.5)
WBC: 6.7 10*3/uL (ref 4.0–10.5)
WBC: 13.6 10*3/uL — ABNORMAL HIGH (ref 4.0–10.5)
nRBC: 0 % (ref 0.0–0.2)
nRBC: 0 % (ref 0.0–0.2)

## 2021-11-28 LAB — GLUCOSE, CAPILLARY
Glucose-Capillary: 114 mg/dL — ABNORMAL HIGH (ref 70–99)
Glucose-Capillary: 137 mg/dL — ABNORMAL HIGH (ref 70–99)
Glucose-Capillary: 96 mg/dL (ref 70–99)
Glucose-Capillary: 105 mg/dL — ABNORMAL HIGH (ref 70–99)
Glucose-Capillary: 140 mg/dL — ABNORMAL HIGH (ref 70–99)

## 2021-11-28 LAB — POCT ACTIVATED CLOTTING TIME
Activated Clotting Time: 257 seconds
Activated Clotting Time: 197 seconds
Activated Clotting Time: 203 seconds
Activated Clotting Time: 227 seconds
Activated Clotting Time: 197 seconds
Activated Clotting Time: 203 seconds

## 2021-11-28 LAB — POCT I-STAT 7, (LYTES, BLD GAS, ICA,H+H)
Acid-base deficit: 1 mmol/L (ref 0.0–2.0)
Bicarbonate: 25.1 mmol/L (ref 20.0–28.0)
Calcium, Ion: 1.16 mmol/L (ref 1.15–1.40)
HCT: 24 % — ABNORMAL LOW (ref 36.0–46.0)
Hemoglobin: 8.2 g/dL — ABNORMAL LOW (ref 12.0–15.0)
O2 Saturation: 97 %
Patient temperature: 37
Potassium: 4.8 mmol/L (ref 3.5–5.1)
Sodium: 136 mmol/L (ref 135–145)
TCO2: 27 mmol/L (ref 22–32)
pCO2 arterial: 50.1 mmHg — ABNORMAL HIGH (ref 32–48)
pH, Arterial: 7.307 — ABNORMAL LOW (ref 7.35–7.45)
pO2, Arterial: 96 mmHg (ref 83–108)

## 2021-11-28 LAB — RENAL FUNCTION PANEL
Albumin: 2.7 g/dL — ABNORMAL LOW (ref 3.5–5.0)
Anion gap: 12 (ref 5–15)
BUN: 48 mg/dL — ABNORMAL HIGH (ref 8–23)
CO2: 27 mmol/L (ref 22–32)
Calcium: 8.9 mg/dL (ref 8.9–10.3)
Chloride: 98 mmol/L (ref 98–111)
Creatinine, Ser: 6.66 mg/dL — ABNORMAL HIGH (ref 0.44–1.00)
GFR, Estimated: 6 mL/min — ABNORMAL LOW (ref >60)
Glucose, Bld: 125 mg/dL — ABNORMAL HIGH (ref 70–99)
Phosphorus: 6.2 mg/dL — ABNORMAL HIGH (ref 2.5–4.6)
Potassium: 4.2 mmol/L (ref 3.5–5.1)
Sodium: 137 mmol/L (ref 135–145)

## 2021-11-28 LAB — PREPARE RBC (CROSSMATCH)

## 2021-11-28 LAB — SURGICAL PCR SCREEN
MRSA, PCR: NEGATIVE
Staphylococcus aureus: NEGATIVE

## 2021-11-28 SURGERY — BYPASS GRAFT FEMORAL-POPLITEAL ARTERY
Anesthesia: General | Site: Leg Upper | Laterality: Right

## 2021-11-28 MED ORDER — ALUM & MAG HYDROXIDE-SIMETH 200-200-20 MG/5ML PO SUSP: 15.0000 mL | ORAL | Status: DC | PRN

## 2021-11-28 MED ORDER — CEFAZOLIN SODIUM-DEXTROSE 2-4 GM/100ML-% IV SOLN
INTRAVENOUS | Status: AC
  Filled 2021-11-28: qty 100

## 2021-11-28 MED ORDER — FENTANYL CITRATE PF 50 MCG/ML IJ SOSY
PREFILLED_SYRINGE | INTRAMUSCULAR | Status: AC
  Filled 2021-11-28: qty 1

## 2021-11-28 MED ORDER — GLYCOPYRROLATE 0.2 MG/ML IJ SOLN
INTRAMUSCULAR | Status: DC | PRN
  Administered 2021-11-28: .2 mg via INTRAVENOUS

## 2021-11-28 MED ORDER — PHENYLEPHRINE HCL (PRESSORS) 10 MG/ML IV SOLN
INTRAVENOUS | Status: AC
  Filled 2021-11-28: qty 1

## 2021-11-28 MED ORDER — HEPARIN 6000 UNIT IRRIGATION SOLUTION
Status: DC | PRN
  Administered 2021-11-28: 1

## 2021-11-28 MED ORDER — POTASSIUM CHLORIDE CRYS ER 20 MEQ PO TBCR: 20.0000 meq | EXTENDED_RELEASE_TABLET | Freq: Every day | ORAL | Status: DC | PRN

## 2021-11-28 MED ORDER — SUFENTANIL CITRATE 50 MCG/ML IV SOLN
INTRAVENOUS | Status: DC | PRN
  Administered 2021-11-28 (×2): 10 ug via INTRAVENOUS

## 2021-11-28 MED ORDER — MAGNESIUM SULFATE 2 GM/50ML IV SOLN: 2.0000 g | Freq: Every day | INTRAVENOUS | Status: DC | PRN

## 2021-11-28 MED ORDER — PROTAMINE SULFATE 10 MG/ML IV SOLN
INTRAVENOUS | Status: DC | PRN
  Administered 2021-11-28: 50 mg via INTRAVENOUS

## 2021-11-28 MED ORDER — TRAMADOL HCL 50 MG PO TABS
50.0000 mg | ORAL_TABLET | Freq: Two times a day (BID) | ORAL | Status: DC | PRN
  Administered 2021-11-29 – 2021-12-03 (×4): 50 mg via ORAL
  Filled 2021-11-28 (×5): qty 1

## 2021-11-28 MED ORDER — HEPARIN SODIUM (PORCINE) 1000 UNIT/ML IJ SOLN
INTRAMUSCULAR | Status: AC
  Filled 2021-11-28: qty 10

## 2021-11-28 MED ORDER — SODIUM CHLORIDE 0.9 % IV SOLN: INTRAVENOUS | Status: DC | PRN

## 2021-11-28 MED ORDER — PHENYLEPHRINE HCL-NACL 20-0.9 MG/250ML-% IV SOLN
INTRAVENOUS | Status: DC | PRN
  Administered 2021-11-28: 50 ug/min via INTRAVENOUS

## 2021-11-28 MED ORDER — SUGAMMADEX SODIUM 200 MG/2ML IV SOLN
INTRAVENOUS | Status: DC | PRN
  Administered 2021-11-28: 200 mg via INTRAVENOUS

## 2021-11-28 MED ORDER — MIDAZOLAM HCL 2 MG/2ML IJ SOLN
INTRAMUSCULAR | Status: DC | PRN
  Administered 2021-11-28: 2 mg via INTRAVENOUS

## 2021-11-28 MED ORDER — LIDOCAINE-PRILOCAINE 2.5-2.5 % EX CREA
1.0000 "application " | TOPICAL_CREAM | CUTANEOUS | Status: DC | PRN
  Filled 2021-11-28: qty 5

## 2021-11-28 MED ORDER — ONDANSETRON HCL 4 MG/2ML IJ SOLN
INTRAMUSCULAR | Status: DC | PRN
  Administered 2021-11-28: 4 mg via INTRAVENOUS

## 2021-11-28 MED ORDER — LIDOCAINE HCL (PF) 1 % IJ SOLN: 5.0000 mL | INTRAMUSCULAR | Status: DC | PRN

## 2021-11-28 MED ORDER — HYDROMORPHONE HCL 1 MG/ML IJ SOLN: 0.2500 mg | INTRAMUSCULAR | Status: DC | PRN

## 2021-11-28 MED ORDER — ALBUMIN HUMAN 5 % IV SOLN: INTRAVENOUS | Status: DC | PRN

## 2021-11-28 MED ORDER — ALBUMIN HUMAN 5 % IV SOLN
INTRAVENOUS | Status: AC
  Filled 2021-11-28: qty 250

## 2021-11-28 MED ORDER — HEPARIN (PORCINE) 25000 UT/250ML-% IV SOLN
1150.0000 [IU]/h | INTRAVENOUS | Status: DC
  Administered 2021-11-28: 500 [IU]/h via INTRAVENOUS
  Administered 2021-11-30: 1150 [IU]/h via INTRAVENOUS
  Filled 2021-11-28: qty 250

## 2021-11-28 MED ORDER — GLYCOPYRROLATE PF 0.2 MG/ML IJ SOSY
PREFILLED_SYRINGE | INTRAMUSCULAR | Status: AC
  Filled 2021-11-28: qty 1

## 2021-11-28 MED ORDER — SODIUM CHLORIDE 0.9 % IV SOLN: 100.0000 mL | INTRAVENOUS | Status: DC | PRN

## 2021-11-28 MED ORDER — SODIUM CHLORIDE 0.9 % IV SOLN: 500.0000 mL | Freq: Once | INTRAVENOUS | Status: DC | PRN

## 2021-11-28 MED ORDER — FENTANYL CITRATE PF 50 MCG/ML IJ SOSY
50.0000 ug | PREFILLED_SYRINGE | INTRAMUSCULAR | Status: DC | PRN
  Administered 2021-11-28: 100 ug via INTRAVENOUS
  Administered 2021-11-29 – 2021-12-03 (×11): 50 ug via INTRAVENOUS
  Filled 2021-11-28 (×3): qty 1
  Filled 2021-11-28: qty 2
  Filled 2021-11-28 (×5): qty 1
  Filled 2021-11-28: qty 2
  Filled 2021-11-28 (×4): qty 1

## 2021-11-28 MED ORDER — ONDANSETRON HCL 4 MG/2ML IJ SOLN
INTRAMUSCULAR | Status: AC
  Filled 2021-11-28: qty 2

## 2021-11-28 MED ORDER — HEPARIN (PORCINE) 25000 UT/250ML-% IV SOLN
INTRAVENOUS | Status: AC
  Filled 2021-11-28: qty 250

## 2021-11-28 MED ORDER — ROCURONIUM 10MG/ML (10ML) SYRINGE FOR MEDFUSION PUMP - OPTIME
INTRAVENOUS | Status: DC | PRN
  Administered 2021-11-28: 50 mg via INTRAVENOUS
  Administered 2021-11-28: 100 mg via INTRAVENOUS
  Administered 2021-11-28 (×2): 50 mg via INTRAVENOUS

## 2021-11-28 MED ORDER — HYDRALAZINE HCL 20 MG/ML IJ SOLN: 5.0000 mg | INTRAMUSCULAR | Status: DC | PRN

## 2021-11-28 MED ORDER — GUAIFENESIN-DM 100-10 MG/5ML PO SYRP: 15.0000 mL | ORAL_SOLUTION | ORAL | Status: DC | PRN

## 2021-11-28 MED ORDER — IODIXANOL 320 MG/ML IV SOLN
INTRAVENOUS | Status: DC | PRN
  Administered 2021-11-28: 30 mL

## 2021-11-28 MED ORDER — PROPOFOL 10 MG/ML IV BOLUS
INTRAVENOUS | Status: DC | PRN
  Administered 2021-11-28: 80 mg via INTRAVENOUS

## 2021-11-28 MED ORDER — STERILE WATER FOR IRRIGATION IR SOLN
Status: DC | PRN
  Administered 2021-11-28: 1000 mL

## 2021-11-28 MED ORDER — HEPARIN 6000 UNIT IRRIGATION SOLUTION
Status: AC
  Filled 2021-11-28: qty 500

## 2021-11-28 MED ORDER — SUFENTANIL CITRATE 50 MCG/ML IV SOLN
INTRAVENOUS | Status: AC
  Filled 2021-11-28: qty 1

## 2021-11-28 MED ORDER — ROCURONIUM BROMIDE 10 MG/ML (PF) SYRINGE
PREFILLED_SYRINGE | INTRAVENOUS | Status: AC
  Filled 2021-11-28: qty 20

## 2021-11-28 MED ORDER — PANTOPRAZOLE SODIUM 40 MG PO TBEC
40.0000 mg | DELAYED_RELEASE_TABLET | Freq: Every day | ORAL | Status: DC
  Administered 2021-11-28 – 2021-11-30 (×3): 40 mg via ORAL
  Filled 2021-11-28 (×4): qty 1

## 2021-11-28 MED ORDER — CEFAZOLIN SODIUM-DEXTROSE 2-3 GM-%(50ML) IV SOLR
INTRAVENOUS | Status: DC | PRN
  Administered 2021-11-28 (×2): 2 g via INTRAVENOUS

## 2021-11-28 MED ORDER — PHENOL 1.4 % MT LIQD: 1.0000 | OROMUCOSAL | Status: DC | PRN

## 2021-11-28 MED ORDER — PROPOFOL 10 MG/ML IV BOLUS
INTRAVENOUS | Status: AC
  Filled 2021-11-28: qty 20

## 2021-11-28 MED ORDER — ALBUMIN HUMAN 5 % IV SOLN
12.5000 g | Freq: Once | INTRAVENOUS | Status: AC
  Administered 2021-11-28: 12.5 g via INTRAVENOUS

## 2021-11-28 MED ORDER — HEMOSTATIC AGENTS (NO CHARGE) OPTIME
TOPICAL | Status: DC | PRN
Start: 2021-11-28 — End: 2021-11-28

## 2021-11-28 MED ORDER — SODIUM CHLORIDE 0.9 % IV SOLN
250.0000 mL | INTRAVENOUS | Status: DC
Start: 2021-11-28 — End: 2021-12-04

## 2021-11-28 MED ORDER — HEPARIN SODIUM (PORCINE) 1000 UNIT/ML IJ SOLN
INTRAMUSCULAR | Status: DC | PRN
  Administered 2021-11-28: 3000 [IU] via INTRAVENOUS
  Administered 2021-11-28: 7000 [IU] via INTRAVENOUS
  Administered 2021-11-28: 3000 [IU] via INTRAVENOUS
  Administered 2021-11-28: 2000 [IU] via INTRAVENOUS
  Administered 2021-11-28: 3000 [IU] via INTRAVENOUS

## 2021-11-28 MED ORDER — INSULIN ASPART 100 UNIT/ML IJ SOLN: 0.0000 [IU] | INTRAMUSCULAR | Status: DC | PRN

## 2021-11-28 MED ORDER — SODIUM CHLORIDE 0.9 % IV SOLN: INTRAVENOUS | Status: DC

## 2021-11-28 MED ORDER — CHLORHEXIDINE GLUCONATE CLOTH 2 % EX PADS
6.0000 | MEDICATED_PAD | Freq: Once | CUTANEOUS | Status: AC
  Administered 2021-11-28: 6 via TOPICAL

## 2021-11-28 MED ORDER — MIDAZOLAM HCL 2 MG/2ML IJ SOLN
INTRAMUSCULAR | Status: AC
  Filled 2021-11-28: qty 2

## 2021-11-28 MED ORDER — CHLORHEXIDINE GLUCONATE CLOTH 2 % EX PADS
6.0000 | MEDICATED_PAD | Freq: Every day | CUTANEOUS | Status: DC
  Administered 2021-11-28 – 2021-12-03 (×6): 6 via TOPICAL

## 2021-11-28 MED ORDER — SODIUM CHLORIDE (PF) 0.9 % IJ SOLN
INTRAMUSCULAR | Status: AC
  Filled 2021-11-28: qty 10

## 2021-11-28 MED ORDER — ROCURONIUM BROMIDE 10 MG/ML (PF) SYRINGE
PREFILLED_SYRINGE | INTRAVENOUS | Status: AC
  Filled 2021-11-28: qty 10

## 2021-11-28 MED ORDER — 0.9 % SODIUM CHLORIDE (POUR BTL) OPTIME
TOPICAL | Status: DC | PRN
  Administered 2021-11-28: 2000 mL

## 2021-11-28 MED ORDER — SODIUM CHLORIDE 0.9 % IV SOLN: 10.0000 mL/h | Freq: Once | INTRAVENOUS | Status: DC

## 2021-11-28 MED ORDER — CHLORHEXIDINE GLUCONATE 0.12 % MT SOLN
15.0000 mL | Freq: Once | OROMUCOSAL | Status: AC
  Administered 2021-11-28: 15 mL via OROMUCOSAL
  Filled 2021-11-28: qty 15

## 2021-11-28 MED ORDER — PENTAFLUOROPROP-TETRAFLUOROETH EX AERO: 1.0000 "application " | INHALATION_SPRAY | CUTANEOUS | Status: DC | PRN

## 2021-11-28 MED ORDER — DOCUSATE SODIUM 100 MG PO CAPS
100.0000 mg | ORAL_CAPSULE | Freq: Every day | ORAL | Status: DC
  Administered 2021-11-29 – 2021-12-01 (×3): 100 mg via ORAL
  Filled 2021-11-28 (×4): qty 1

## 2021-11-28 MED ORDER — NOREPINEPHRINE 4 MG/250ML-% IV SOLN
2.0000 ug/min | INTRAVENOUS | Status: DC
  Administered 2021-11-28: 2 ug/min via INTRAVENOUS
  Administered 2021-11-28: 4 ug/min via INTRAVENOUS
  Administered 2021-11-29: 2 ug/min via INTRAVENOUS
  Filled 2021-11-28 (×2): qty 250

## 2021-11-28 MED ORDER — LIDOCAINE HCL (CARDIAC) PF 100 MG/5ML IV SOSY
PREFILLED_SYRINGE | INTRAVENOUS | Status: DC | PRN
  Administered 2021-11-28: 60 mg via INTRATRACHEAL

## 2021-11-28 SURGICAL SUPPLY — 84 items
ADH SKN CLS APL DERMABOND .7 (GAUZE/BANDAGES/DRESSINGS) ×2
AGENT HMST KT MTR STRL THRMB (HEMOSTASIS)
APL PRP STRL LF DISP 70% ISPRP (MISCELLANEOUS) ×4
BAG COUNTER SPONGE SURGICOUNT (BAG) ×4 IMPLANT
BAG SPNG CNTER NS LX DISP (BAG) ×2
BANDAGE ESMARK 6X9 LF (GAUZE/BANDAGES/DRESSINGS) IMPLANT
BLADE SURG 11 STRL SS (BLADE) ×1 IMPLANT
BNDG CMPR 9X4 STRL LF SNTH (GAUZE/BANDAGES/DRESSINGS)
BNDG CMPR 9X6 STRL LF SNTH (GAUZE/BANDAGES/DRESSINGS)
BNDG ESMARK 4X9 LF (GAUZE/BANDAGES/DRESSINGS) IMPLANT
BNDG ESMARK 6X9 LF (GAUZE/BANDAGES/DRESSINGS)
CANISTER SUCT 3000ML PPV (MISCELLANEOUS) ×4 IMPLANT
CANISTER WOUNDNEG PRESSURE 500 (CANNISTER) ×1 IMPLANT
CANNULA VESSEL 3MM 2 BLNT TIP (CANNULA) ×8 IMPLANT
CATH EMB 4FR 40CM (CATHETERS) ×2 IMPLANT
CHLORAPREP W/TINT 26 (MISCELLANEOUS) ×8 IMPLANT
COVER SURGICAL LIGHT HANDLE (MISCELLANEOUS) ×1 IMPLANT
CUFF TOURN SGL QUICK 24 (TOURNIQUET CUFF)
CUFF TOURN SGL QUICK 34 (TOURNIQUET CUFF) ×3
CUFF TOURN SGL QUICK 42 (TOURNIQUET CUFF) IMPLANT
CUFF TRNQT CYL 24X4X16.5-23 (TOURNIQUET CUFF) IMPLANT
CUFF TRNQT CYL 34X4.125X (TOURNIQUET CUFF) IMPLANT
DERMABOND ADVANCED (GAUZE/BANDAGES/DRESSINGS) ×1
DERMABOND ADVANCED .7 DNX12 (GAUZE/BANDAGES/DRESSINGS) IMPLANT
DRAIN CHANNEL 15F RND FF W/TCR (WOUND CARE) IMPLANT
DRAPE C-ARM 42X72 X-RAY (DRAPES) IMPLANT
DRAPE HALF SHEET 40X57 (DRAPES) ×1 IMPLANT
DRAPE X-RAY CASS 24X20 (DRAPES) IMPLANT
DRESSING PEEL AND PLC PRVNA 13 (GAUZE/BANDAGES/DRESSINGS) IMPLANT
DRSG PEEL AND PLACE PREVENA 13 (GAUZE/BANDAGES/DRESSINGS) ×3
ELECT REM PT RETURN 9FT ADLT (ELECTROSURGICAL) ×3
ELECTRODE REM PT RTRN 9FT ADLT (ELECTROSURGICAL) ×3 IMPLANT
EVACUATOR SILICONE 100CC (DRAIN) IMPLANT
FELT TEFLON 1X6 (MISCELLANEOUS) ×1 IMPLANT
GAUZE SPONGE 4X4 12PLY STRL (GAUZE/BANDAGES/DRESSINGS) ×4 IMPLANT
GLOVE SURG POLYISO LF SZ8 (GLOVE) ×5 IMPLANT
GOWN STRL REUS TWL 2XL XL LVL4 (GOWN DISPOSABLE) ×1 IMPLANT
GOWN STRL REUS W/ TWL LRG LVL3 (GOWN DISPOSABLE) ×6 IMPLANT
GOWN STRL REUS W/ TWL XL LVL3 (GOWN DISPOSABLE) ×3 IMPLANT
GOWN STRL REUS W/TWL LRG LVL3 (GOWN DISPOSABLE) ×6
GOWN STRL REUS W/TWL XL LVL3 (GOWN DISPOSABLE) ×9
GRAFT PROPATEN W/RING 6X80X60 (Vascular Products) ×1 IMPLANT
GRAFT VASC PATCH XENOSURE 1X14 (Vascular Products) ×1 IMPLANT
INSERT FOGARTY SM (MISCELLANEOUS) IMPLANT
KIT BASIN OR (CUSTOM PROCEDURE TRAY) ×4 IMPLANT
KIT TURNOVER KIT B (KITS) ×4 IMPLANT
LOOP VESSEL MINI RED (MISCELLANEOUS) ×1 IMPLANT
MARKER GRAFT CORONARY BYPASS (MISCELLANEOUS) IMPLANT
NS IRRIG 1000ML POUR BTL (IV SOLUTION) ×8 IMPLANT
PACK PERIPHERAL VASCULAR (CUSTOM PROCEDURE TRAY) ×4 IMPLANT
PAD ARMBOARD 7.5X6 YLW CONV (MISCELLANEOUS) ×8 IMPLANT
PATCH VASC XENOSURE 1CMX6CM (Vascular Products) ×3 IMPLANT
PATCH VASC XENOSURE 1X6 (Vascular Products) IMPLANT
SET COLLECT BLD 21X3/4 12 (NEEDLE) IMPLANT
SET MICROPUNCTURE 5F STIFF (MISCELLANEOUS) ×1 IMPLANT
SET WALTER ACTIVATION W/DRAPE (SET/KITS/TRAYS/PACK) ×1 IMPLANT
SPONGE T-LAP 18X18 ~~LOC~~+RFID (SPONGE) ×3 IMPLANT
STAPLER VISISTAT 35W (STAPLE) ×1 IMPLANT
STOPCOCK 4 WAY LG BORE MALE ST (IV SETS) ×1 IMPLANT
STOPCOCK MORSE 400PSI 3WAY (MISCELLANEOUS) ×1 IMPLANT
SURGIFLO W/THROMBIN 8M KIT (HEMOSTASIS) IMPLANT
SUT ETHILON 2 0 PSLX (SUTURE) ×2 IMPLANT
SUT ETHILON 3 0 PS 1 (SUTURE) IMPLANT
SUT MNCRL AB 4-0 PS2 18 (SUTURE) ×8 IMPLANT
SUT PROLENE 5 0 C 1 24 (SUTURE) ×6 IMPLANT
SUT PROLENE 5 0 C 1 36 (SUTURE) ×4 IMPLANT
SUT PROLENE 6 0 BV (SUTURE) ×23 IMPLANT
SUT SILK 2 0 SH (SUTURE) ×4 IMPLANT
SUT SILK 3 0 (SUTURE) ×6
SUT SILK 3-0 18XBRD TIE 12 (SUTURE) IMPLANT
SUT VIC AB 2-0 CT1 27 (SUTURE) ×6
SUT VIC AB 2-0 CT1 TAPERPNT 27 (SUTURE) ×6 IMPLANT
SUT VIC AB 3-0 SH 27 (SUTURE) ×6
SUT VIC AB 3-0 SH 27X BRD (SUTURE) ×6 IMPLANT
SYR 10ML LL (SYRINGE) ×1 IMPLANT
SYR 30ML LL (SYRINGE) ×1 IMPLANT
SYR 3ML LL SCALE MARK (SYRINGE) ×1 IMPLANT
TOWEL GREEN STERILE (TOWEL DISPOSABLE) ×4 IMPLANT
TRAY FOLEY MTR SLVR 14FR STAT (SET/KITS/TRAYS/PACK) ×1 IMPLANT
TRAY FOLEY MTR SLVR 16FR STAT (SET/KITS/TRAYS/PACK) ×3 IMPLANT
TUBING EXTENTION W/L.L. (IV SETS) ×1 IMPLANT
UNDERPAD 30X36 HEAVY ABSORB (UNDERPADS AND DIAPERS) ×4 IMPLANT
WATER STERILE IRR 1000ML POUR (IV SOLUTION) ×5 IMPLANT
WIRE TORQFLEX AUST .018X40CM (WIRE) ×1 IMPLANT

## 2021-11-28 NOTE — Anesthesia Procedure Notes (Signed)
Procedure Name: Intubation ?Date/Time: 11/28/2021 7:40 AM ?Performed by: Claris Che, CRNA ?Pre-anesthesia Checklist: Patient identified, Emergency Drugs available, Suction available, Patient being monitored and Timeout performed ?Patient Re-evaluated:Patient Re-evaluated prior to induction ?Oxygen Delivery Method: Circle system utilized ?Preoxygenation: Pre-oxygenation with 100% oxygen ?Induction Type: IV induction and Cricoid Pressure applied ?Ventilation: Mask ventilation without difficulty ?Laryngoscope Size: Mac and 4 ?Grade View: Grade II ?Tube type: Oral ?Tube size: 7.5 mm ?Number of attempts: 1 ?Airway Equipment and Method: Stylet ?Placement Confirmation: ETT inserted through vocal cords under direct vision, positive ETCO2 and breath sounds checked- equal and bilateral ?Secured at: 23 cm ?Tube secured with: Tape ?Dental Injury: Teeth and Oropharynx as per pre-operative assessment  ? ? ? ? ?

## 2021-11-28 NOTE — Anesthesia Procedure Notes (Signed)
Arterial Line Insertion ?Start/End3/22/2023 7:15 AM, 11/28/2021 7:17 AM ?Performed by: Claris Che, CRNA, CRNA ? Patient location: Pre-op. ?Preanesthetic checklist: patient identified, IV checked, risks and benefits discussed, monitors and equipment checked and pre-op evaluation ?Lidocaine 1% used for infiltration ?Right, radial was placed ?Catheter size: 20 G ?Hand hygiene performed  and maximum sterile barriers used  ? ?Attempts: 1 ?Procedure performed without using ultrasound guided technique. ?Following insertion, Biopatch and dressing applied. ?Post procedure assessment: normal ? ?Patient tolerated the procedure well with no immediate complications. ? ? ?

## 2021-11-28 NOTE — Progress Notes (Signed)
eLink Physician-Brief Progress Note ?Patient Name: Tina Gomez ?DOB: 1956-06-24 ?MRN: [id number] ? ? ?Date of Service ? 11/28/2021  ?HPI/Events of Note ? Pain - No relief with Dilaudid PO.  ?eICU Interventions ? Plan: ?Fentanyl 50-100 mcg IV Q 2 hours PRN severe pain or breakthrough pain.   ? ? ? ?Intervention Category ?Major Interventions: Other: ? ?Quintara Bost Cornelia Copa ?11/28/2021, 9:18 PM ?

## 2021-11-28 NOTE — Progress Notes (Signed)
Reviewed consult for protocol US guided IV due to vasopressor order. Pt nor currently receiving vasopressor and has 3 access sites documented. Consult cleared. ?

## 2021-11-28 NOTE — Consult Note (Signed)
? ?NAME:  Tina Gomez, MRN:  [id number], DOB:  [birth year], LOS: 7 ?ADMISSION DATE:  11/21/2021, CONSULTATION DATE:  11/28/21 ?REFERRING MD:  Stanford Breed, CHIEF COMPLAINT:  critical limb ischemia  ? ?History of Present Illness:  ?Tina Gomez is a 66 year old chronically ill female with severe vascular disease who was admitted on 11/21/2021 for bilateral lower extremity pain, found to have critical limb ischemia of the right lower extremity.  She underwent extensive endarterectomy of the right external iliac, common femoral, right profunda femoris, right popliteal and tibial peroneal arteries as well as a femoral to tibial peroneal trunk bypass.  She required some low-dose Levophed following the procedure and critical care was asked to consult for transfer to the ICU. ? ?Pertinent  Medical History  ?PAD, CAD, HFrEF (EF 30%), history of stroke, hyperlipidemia, hypertension ?End-stage renal disease on dialysis ?COPD with chronic respiratory failure (3 to 4 L at baseline), tobacco use ?Type 2 diabetes ?Depression/anxiety ? ?Significant Hospital Events: ?Including procedures, antibiotic start and stop dates in addition to other pertinent events   ?3/15: Admission to [facility] ?3/22:endarterectomy of the right external iliac, common femoral, right profunda femoris, right popliteal and tibial peroneal arteries as well as a femoral to tibial peroneal trunk bypass.  PCCM consulted for postoperative hypotension ? ?Interim History / Subjective:  ?N/A ? ?Objective   ?Blood pressure (!) 145/47, pulse (!) 59, temperature 98.1 ?F (36.7 ?C), temperature source Oral, resp. rate (!) 21, height 5\' 2"  (1.575 m), weight 78.7 kg, SpO2 99 %. ?   ?   ? ?Intake/Output Summary (Last 24 hours) at 11/28/2021 1701 ?Last data filed at 11/28/2021 1509 ?Gross per 24 hour  ?Intake 2210 ml  ?Output 730 ml  ?Net 1480 ml  ? ?Filed Weights  ? 11/27/21 0500 11/28/21 0500 11/28/21 0622  ?Weight: 78.4 kg 78.7 kg 78.7 kg  ? ? ?Examination: ?General: Acutely and  chronically ill-appearing female laying on the stretcher ?Cardiac: Heart regular rate and rhythm, dorsalis pedis and posterior tibial pulses appreciable on Doppler on the right ?Pulm: Breathing comfortably on 5 L supplemental oxygen via nonrebreather.  Lung sounds are clear ?Abdomen: Abdomen is soft, nondistended ?Neuro: Lethargic postoperatively, moans in bed ?Skin: Linear surgical incision on the medial aspect of the right lower extremity.   ? ? ?Resolved Hospital Problem list   ? ? ?Assessment & Plan:  ? ?Postoperative hypotension ?Critical limb ischemia status post extensive endarterectomy with femoral-tibial peroneal bypass ?Acute blood loss anemia ? ?Discussion: Reportedly lost a fair amount of blood intraoperatively due to the extensive nature of the procedure.  Currently only requiring 4 mcg of Levophed.  Suspect that hypotension should resolve after PRBC transfusions. If she continues to have an ongoing pressor requirement, will need to reassess need for central access ? ?Plan ?Transfer to ICU ?Continue PRBC transfusion ?Wean Levophed for MAP goal greater than 65 mmHg ?Post operative management per primary ? ?COPD with chronic respiratory failure, tobacco use disorder ?-wean supplemental oxygen to baseline 4L as able. Maintain O2 saturations 88-92% ? ?Type 2 diabetes mellitus ?-not requiring insulin. Continue CBG checks ? ?History of hypertension ?Non-obstructive CAD ([facility] 11/2020) ?HFrEF (EF 30-35%) ?-hold home antihypertensives ? ?ESRD on HD ?-management per nephrology ? ?Best Practice (right click and "Reselect all SmartList Selections" daily)  ? ?Diet/type: per primary ?DVT prophylaxis: heparin gtt ?Lines: Right EJ, left foot, right anticubital ?Code Status:  DNR ?Last date of multidisciplinary goals of care discussion [per primary] ? ?Labs   ?CBC: ?Recent Labs  ?Lab  11/21/21 ?1842 11/22/21 ?0336 11/25/21 ?0206 11/26/21 ?0155 11/27/21 ?0303 11/28/21 ?0230 11/28/21 ?1510  ?WBC 6.5   < > 6.5 6.2 6.7 6.7  13.6*  ?NEUTROABS 4.2  --   --   --   --   --   --   ?HGB 12.4   < > 9.6* 9.4* 9.7* 9.2* 7.0*  ?HCT 40.5   < > 32.3* 31.9* 34.3* 31.5* 24.1*  ?MCV 90.4   < > 91.8 92.7 93.5 91.8 93.4  ?PLT 232   < > 187 179 208 207 212  ? < > = values in this interval not displayed.  ? ? ?Basic Metabolic Panel: ?Recent Labs  ?Lab 11/23/21 ?4665 11/23/21 ?1924 11/24/21 ?9935 11/26/21 ?0155 11/27/21 ?0303 11/28/21 ?0230  ?NA 134*  --  134* 135 136 137  ?K 4.7  --  4.8 4.4 4.0 4.2  ?CL 94*  --  94* 99 98 98  ?CO2 25  --  24 25 27 27   ?GLUCOSE 113*  --  87 137* 134* 125*  ?BUN 56*  --  65* 52* 35* 48*  ?CREATININE 7.00* 7.69* 8.09* 7.19* 5.26* 6.66*  ?CALCIUM 9.0  --  9.0 8.5* 8.7* 8.9  ?PHOS  --   --  9.4* 6.9* 5.8* 6.2*  ? ?GFR: ?Estimated Creatinine Clearance: 8.3 mL/min (A) (by C-G formula based on SCr of 6.66 mg/dL (H)). ?Recent Labs  ?Lab 11/26/21 ?0155 11/27/21 ?0303 11/28/21 ?0230 11/28/21 ?1510  ?WBC 6.2 6.7 6.7 13.6*  ? ? ?Liver Function Tests: ?Recent Labs  ?Lab 11/21/21 ?1842 11/24/21 ?7017 11/26/21 ?0155 11/27/21 ?0303 11/28/21 ?0230  ?AST 18  --   --   --   --   ?ALT 10  --   --   --   --   ?ALKPHOS 73  --   --   --   --   ?BILITOT 0.7  --   --   --   --   ?PROT 7.1  --   --   --   --   ?ALBUMIN 3.1* 2.8* 2.7* 2.8* 2.7*  ? ?No results for input(s): LIPASE, AMYLASE in the last 168 hours. ?No results for input(s): AMMONIA in the last 168 hours. ? ?ABG ?   ?Component Value Date/Time  ? HCO3 24.2 08/03/2021 1523  ? TCO2 25 08/03/2021 1523  ? ACIDBASEDEF 1.0 08/03/2021 1523  ? O2SAT 92.0 08/03/2021 1523  ?  ? ?Coagulation Profile: ?Recent Labs  ?Lab 11/21/21 ?2010  ?INR 1.2  ? ? ?Cardiac Enzymes: ?No results for input(s): CKTOTAL, CKMB, CKMBINDEX, TROPONINI in the last 168 hours. ? ?HbA1C: ?Hemoglobin A1C  ?Date/Time Value Ref Range Status  ?11/22/2021 04:01 PM 6.3 (A) 4.0 - 5.6 % Final  ? ?HbA1c, POC (controlled diabetic range)  ?Date/Time Value Ref Range Status  ?08/14/2021 11:40 AM 6.4 0.0 - 7.0 % Final  ?05/23/2020 04:13 PM  7.6 (A) 0.0 - 7.0 % Final  ? ?Hgb A1c MFr Bld  ?Date/Time Value Ref Range Status  ?04/06/2021 03:08 AM 6.2 (H) 4.8 - 5.6 % Final  ?  Comment:  ?  (NOTE) ?Pre diabetes:          5.7%-6.4% ? ?Diabetes:              >6.4% ? ?Glycemic control for   <7.0% ?adults with diabetes ?  ?11/08/2020 04:42 AM 6.4 (H) 4.8 - 5.6 % Final  ?  Comment:  ?  (NOTE) ?Pre diabetes:  5.7%-6.4% ? ?Diabetes:              >6.4% ? ?Glycemic control for   <7.0% ?adults with diabetes ?  ? ? ?CBG: ?Recent Labs  ?Lab 11/27/21 ?2136 11/28/21 ?0602 11/28/21 ?1037 11/28/21 ?1230 11/28/21 ?1501  ?GLUCAP 152* 114* 137* 96 105*  ? ? ?Review of Systems:   ?Unable to be obtained due to encephalopathy ? ?Past Medical History:  ?She,  has a past medical history of Anemia, CAD (coronary artery disease), Chronic respiratory failure with hypoxia ([place]), COPD (chronic obstructive pulmonary disease) ([name]) (05/2019), Diabetes mellitus without complication ([place]), ESRD on hemodialysis ([place]), History of blood transfusion (09/2020), HLD (hyperlipidemia), Hypertension, Peripheral vascular disease ([place]), PONV (postoperative nausea and vomiting), Restless legs syndrome (RLS), and Stroke ([place]).  ? ?Surgical History:  ? ?Past Surgical History:  ?Procedure Laterality Date  ? AV FISTULA PLACEMENT Left 07/27/2020  ? Procedure: LEFT BRACHIO-BASILIC ARTERIOVENOUS (AV) FISTULA CREATION;  Surgeon: Cherre Robins, MD;  Location: [place];  Service: Vascular;  Laterality: Left;  ? BASCILIC VEIN TRANSPOSITION Left 11/02/2020  ? Procedure: LEFT UPPER ARM SECOND STAGE BASILIC VEIN TRANSPOSITION;  Surgeon: Cherre Robins, MD;  Location: [facility] OR;  Service: Vascular;  Laterality: Left;  PERIPHERAL NERVE BLOCK  ? CESAREAN SECTION    ? x 1  ? ESOPHAGOGASTRODUODENOSCOPY (EGD) WITH PROPOFOL N/A 05/26/2020  ? Procedure: ESOPHAGOGASTRODUODENOSCOPY (EGD) WITH PROPOFOL;  Surgeon: Irene Shipper, MD;  Location: [facility] ENDOSCOPY;  Service: Endoscopy;  Laterality: N/A;  ? INSERTION OF DIALYSIS  CATHETER Right 07/27/2020  ? Procedure: INSERTION OF RIGHT INTERNAL JUGULAR TUNNELED DIALYSIS CATHETER;  Surgeon: Cherre Robins, MD;  Location: [place];  Service: Vascular;  Laterality: Right;  ? IR FLUORO GUIDE

## 2021-11-28 NOTE — Transfer of Care (Signed)
Immediate Anesthesia Transfer of Care Note ? ?Patient: Tina Gomez ? ?Procedure(s) Performed: RIGHT FEMORAL-POPLITEAL ARTERY BYPASS WITH 6MM REMOVABLE RING GORE GRAFT WITH EXTENDED PERFUNDAPLASTY (Right: Leg Upper) ?ENDARTERECTOMY FEMORAL (Right: Leg Upper) ?PATCH ANGIOPLASTY USING BOVINE PERICARDIUM XENOSURE PATCH (Right: Leg Upper) ?INTRA OPERATIVE ARTERIOGRAM (Right: Leg Lower) ? ?Patient Location: PACU ? ?Anesthesia Type:General ? ?Level of Consciousness: drowsy, patient cooperative and responds to stimulation ? ?Airway & Oxygen Therapy: Patient Spontanous Breathing and Patient connected to face mask oxygen ? ?Post-op Assessment: Report given to RN, Post -op Vital signs reviewed and stable and Patient moving all extremities X 4 ? ?Post vital signs: Reviewed and stable ? ?Last Vitals:  ?Vitals Value Taken Time  ?BP 86/41 11/28/21 1502  ?Temp    ?Pulse 51 11/28/21 1510  ?Resp 17 11/28/21 1510  ?SpO2 98 % 11/28/21 1510  ?Vitals shown include unvalidated device data. ? ?Last Pain:  ?Vitals:  ? 11/28/21 0622  ?TempSrc: Oral  ?PainSc:   ?   ? ?Patients Stated Pain Goal: 0 (11/27/21 2143) ? ?Complications: No notable events documented. ?

## 2021-11-28 NOTE — Progress Notes (Addendum)
? [facility] ?Progress Note  ? ?Subjective: Seen in PACU. O2 sats 89-placed on 100% NRB. Cautioned staff that pt has COPD and retains CO2. SBP low 90s. Starting Levophed. PCCM consulted. Will transfer to 2 heart. Minimally responsive-very lethargic.  ? ?Objective ?Vitals:  ? 11/28/21 1543 11/28/21 1547 11/28/21 1602 11/28/21 1615  ?BP: (!) 93/43 (!) 91/43 (!) 102/51   ?Pulse: (!) 56 (!) 52 (!) 55 (!) 57  ?Resp: 16 17 16  (!) 23  ?Temp: 98.1 ?F (36.7 ?C)     ?TempSrc: Oral     ?SpO2:  94% 97% 97%  ?Weight:      ?Height:      ? ?Physical Exam ?General: Chronically ill appearing female in NAD ?Heart: S1,S2 RRR No M/R/G ?Lungs: CTAB. Respirations very shallow. On 100% NRB.  ?Abdomen: NABS, foley (!!!) to gravity drain, minimal UOP.  ?Extremities: No LE edema. Wound vac intact R femoral area.  ?Dialysis Access: L AVF + T/B ?Lines: R Radial Aline, REJ IV, PIV RFA, PIV L foot.  ? ? ?Additional Objective ?Labs: ?Basic Metabolic Panel: ?Recent Labs  ?Lab 11/26/21 ?0155 11/27/21 ?0303 11/28/21 ?0230  ?NA 135 136 137  ?K 4.4 4.0 4.2  ?CL 99 98 98  ?CO2 25 27 27   ?GLUCOSE 137* 134* 125*  ?BUN 52* 35* 48*  ?CREATININE 7.19* 5.26* 6.66*  ?CALCIUM 8.5* 8.7* 8.9  ?PHOS 6.9* 5.8* 6.2*  ? ?Liver Function Tests: ?Recent Labs  ?Lab 11/21/21 ?1842 11/24/21 ?7371 11/26/21 ?0155 11/27/21 ?0303 11/28/21 ?0230  ?AST 18  --   --   --   --   ?ALT 10  --   --   --   --   ?ALKPHOS 73  --   --   --   --   ?BILITOT 0.7  --   --   --   --   ?PROT 7.1  --   --   --   --   ?ALBUMIN 3.1*   < > 2.7* 2.8* 2.7*  ? < > = values in this interval not displayed.  ? ?No results for input(s): LIPASE, AMYLASE in the last 168 hours. ?CBC: ?Recent Labs  ?Lab 11/21/21 ?1842 11/22/21 ?0336 11/25/21 ?0206 11/26/21 ?0155 11/27/21 ?0303 11/28/21 ?0230 11/28/21 ?1510  ?WBC 6.5   < > 6.5 6.2 6.7 6.7 13.6*  ?NEUTROABS 4.2  --   --   --   --   --   --   ?HGB 12.4   < > 9.6* 9.4* 9.7* 9.2* 7.0*  ?HCT 40.5   < > 32.3* 31.9* 34.3* 31.5* 24.1*  ?MCV 90.4   < >  91.8 92.7 93.5 91.8 93.4  ?PLT 232   < > 187 179 208 207 212  ? < > = values in this interval not displayed.  ? ?Blood Culture ?   ?Component Value Date/Time  ? SDES EXPECTORATED SPUTUM 09/13/2021 0459  ? SDES EXPECTORATED SPUTUM 09/13/2021 0459  ? [place] NONE 09/13/2021 0459  ? SPECREQUEST NONE Reflexed from [id number] 09/13/2021 0459  ? CULT  09/13/2021 0459  ?  FEW Normal respiratory flora-no Staph aureus or Pseudomonas seen ?Performed at [facility], [place] [address] [place], [place] [address] ?  ? REPTSTATUS 09/13/2021 FINAL 09/13/2021 0459  ? REPTSTATUS 09/15/2021 FINAL 09/13/2021 0459  ? ? ?Cardiac Enzymes: ?No results for input(s): CKTOTAL, CKMB, CKMBINDEX, TROPONINI in the last 168 hours. ?CBG: ?Recent Labs  ?Lab 11/27/21 ?2136 11/28/21 ?0602 11/28/21 ?1037 11/28/21 ?1230 11/28/21 ?1501  ?  GLUCAP 152* 114* 137* 96 105*  ? ?Iron Studies: No results for input(s): IRON, TIBC, TRANSFERRIN, FERRITIN in the last 72 hours. ?@lablastinr3 @ ?Studies/Results: ?DG C-Arm 1-60 Min-No Report ? ?Result Date: 11/28/2021 ?Fluoroscopy was utilized by the requesting physician.  No radiographic interpretation.  ? ?DG C-Arm 1-60 Min-No Report ? ?Result Date: 11/28/2021 ?Fluoroscopy was utilized by the requesting physician.  No radiographic interpretation.  ? ?DG C-Arm 1-60 Min-No Report ? ?Result Date: 11/28/2021 ?Fluoroscopy was utilized by the requesting physician.  No radiographic interpretation.   ?Medications: ? [MAR Hold] sodium chloride    ? sodium chloride    ? sodium chloride    ? sodium chloride 75 mL/hr at 11/28/21 1523  ? sodium chloride    ? albumin human    ? ceFAZolin    ? [MAR Hold]  ceFAZolin (ANCEF) IV    ? heparin    ? heparin 500 Units/hr (11/28/21 1521)  ? [MAR Hold] iron sucrose    ? norepinephrine (LEVOPHED) Adult infusion 4 mcg/min (11/28/21 1606)  ? ? [MAR Hold] aspirin  325 mg Oral Daily  ? [MAR Hold] atorvastatin  80 mg Oral q1800  ? [MAR Hold] carvedilol  12.5 mg Oral QHS  ? And  ? [MAR Hold]  carvedilol  12.5 mg Oral Once per day on Sun Tue Thu  ? [MAR Hold] darbepoetin (ARANESP) injection - DIALYSIS  25 mcg Intravenous Q Mon-HD  ? [MAR Hold] doxercalciferol  1 mcg Intravenous Q M,W,F-HD  ? [MAR Hold] FLUoxetine  20 mg Oral QHS  ? [MAR Hold] gabapentin  200 mg Oral QHS  ? [MAR Hold] insulin aspart  0-6 Units Subcutaneous TID WC  ? [MAR Hold] losartan  100 mg Oral Daily  ? [MAR Hold] multivitamin  1 tablet Oral QHS  ? [MAR Hold] sodium chloride flush  3 mL Intravenous Q12H  ? [MAR Hold] sodium chloride flush  3 mL Intravenous Q12H  ? [MAR Hold] sucroferric oxyhydroxide  500 mg Oral TID WC  ? ? ? ?Dialysis Orders: ?Andree Elk form MWFS 3 hours EDW 75 kg ?3K, 2.5 CA bath UF P2 ?Heparin 5000 units IV Initial bolus 2000 units IV mid run ?Hectorol 1 mcg IV TIW ?Venofer 50 mg IV weekly ?  ?  ?Assessment/Plan: ?Bilateral lower extremity critical limb extremity ischemia-Followed by [facility]. Needs staged bilateral femoral endarterectomies and FPB. R To OR today per Dr. Stanford Breed. S/P Right external iliac, common femoral and extensive right profunda femoris endarterectomy, Right popliteal and tibioperoneal trunk endarterectomy and bovine patch angioplasty, Right common femoral to tibioperoneal trunk bypass with 56mm ePTFE, RLE angiogram. Per primary, VVS.  ?ESRD -HD [facility]. Going to OR 11/28/2021. Next HD 03/23 off schedule the again 11/30/2021 to get back on schedule.  ?Hypertension/volume  -No evidence of volume overload by exam. HD 11/27/2021: Pre wt 86.5 kg Net UF 1435 cc Post Op 83.8 kg.  UF as tolerated.  ?Anemia  -HGB 9.2 this AM, fell to 7.0. S/P 2 units PRBCS. Starting ESA tomorrow.  Continue weekly Venofer. Follow HBG.   ?Metabolic bone disease -Continue binders, VDRA.  ?History of HFrEF (last EF 30-35% with G1DD 11/24/2021). Optimize volume with HD. Reminded patient to adhere to fluid restrictions.  ?COPD/tobacco abuse-Meds per primary ?DM type II -Per primary.  ?Nutrition -ALB low.  diet renal/carb modified renal with  fluid restriction renal vitamin protein supplement. ?History of anxiety/depression- Per primary ? ?Tina Aldama H. Ambrose Wile NP-C ?11/28/2021, 4:22 PM  ?[facility] ?[phone] ? ? ?  ? ?

## 2021-11-28 NOTE — Progress Notes (Signed)
[facility] [place] ?PROGRESS NOTE ? ?ASSESSMENT / PLAN: ?Tina Gomez is a 66 y.o. female with bilateral lower extremity chronic limb threatening ischemia; right worse than left. Plan right femoral endarterectomy and femoral-popliteal bypass with GoreTex graft today. All questions answered. She understands she is high risk.   ? ?SUBJECTIVE: ?No complaints. ? ?OBJECTIVE: ?BP (!) 174/67 (BP Location: Right Arm) Comment: notified RN  Pulse (!) 57 Comment: notified RN  Temp 98.1 ?F (36.7 ?C) (Oral)   Resp 16   Ht 5\' 2"  (1.575 m)   Wt 78.7 kg   LMP  (LMP Unknown)   SpO2 96%   BMI 31.73 kg/m?  ? ?Intake/Output Summary (Last 24 hours) at 11/28/2021 0726 ?Last data filed at 11/27/2021 2145 ?Gross per 24 hour  ?Intake 540 ml  ?Output --  ?Net 540 ml  ?  ?No acute distress ?Regular rate and rhythm ?Unlabored breathing ?Unchanged appearance of right great toe - small ulcer ? ?CBC Latest Ref Rng & Units 11/28/2021 11/27/2021 11/26/2021  ?WBC 4.0 - 10.5 K/uL 6.7 6.7 6.2  ?Hemoglobin 12.0 - 15.0 g/dL 9.2(L) 9.7(L) 9.4(L)  ?Hematocrit 36.0 - 46.0 % 31.5(L) 34.3(L) 31.9(L)  ?Platelets 150 - 400 K/uL 207 208 179  ?  ? ?CMP Latest Ref Rng & Units 11/28/2021 11/27/2021 11/26/2021  ?Glucose 70 - 99 mg/dL 125(H) 134(H) 137(H)  ?BUN 8 - 23 mg/dL 48(H) 35(H) 52(H)  ?Creatinine 0.44 - 1.00 mg/dL 6.66(H) 5.26(H) 7.19(H)  ?Sodium 135 - 145 mmol/L 137 136 135  ?Potassium 3.5 - 5.1 mmol/L 4.2 4.0 4.4  ?Chloride 98 - 111 mmol/L 98 98 99  ?CO2 22 - 32 mmol/L 27 27 25   ?Calcium 8.9 - 10.3 mg/dL 8.9 8.7(L) 8.5(L)  ?Total Protein 6.5 - 8.1 g/dL - - -  ?Total Bilirubin 0.3 - 1.2 mg/dL - - -  ?Alkaline Phos 38 - 126 U/L - - -  ?AST 15 - 41 U/L - - -  ?ALT 0 - 44 U/L - - -  ? ? ?Estimated Creatinine Clearance: 8.3 mL/min (A) (by C-G formula based on SCr of 6.66 mg/dL (H)). ? ?[name]. Stanford Breed, MD ?[facility] [place] ?Office Phone Number: [phone] ?11/28/2021 7:26 AM ? ? ? ?

## 2021-11-28 NOTE — Progress Notes (Signed)
Per Dr. Valeta Harms at bedside wanted PRBS rate to be increased from 163ml/hr to 258ml/hr. ?

## 2021-11-28 NOTE — Op Note (Signed)
DATE OF SERVICE: 11/28/2021  PATIENT:  Tina Gomez  66 y.o. 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  POST-OPERATIVE DIAGNOSIS:  Same  PROCEDURE:   1) Right external iliac, common femoral and extensive right profunda femoris endarterectomy (second order branches of profunda) and bovine patch angioplasty 2) Right popliteal and tibioperoneal trunk endarterectomy and bovine patch angioplasty 3) Right common femoral to tibioperoneal trunk bypass with 6mm ePTFE 4) Right lower extremity angiogram  SURGEON:  Surgeon(s) and Role:    * Leonie Douglas, MD - Primary    * Victorino Sparrow, MD - Assisting  ASSISTANT: Nathanial Rancher, PA-C  An experienced assistant was required given the complexity of this procedure and the standard of surgical care. My assistant helped with exposure through counter tension, suctioning, ligation and retraction to better visualize the surgical field.  My assistant expedited sewing during the case by following my sutures. Wherever I use the term "we" in the report, my assistant actively helped me with that portion of the procedure.  ANESTHESIA:   general  EBL:  BLOOD ADMINISTERED: none  DRAINS: none   LOCAL MEDICATIONS USED:  NONE  SPECIMEN:  none  COUNTS: confirmed correct.  TOURNIQUET:    Total Tourniquet Time Documented: Thigh (Right) - 22 minutes Thigh (Right) - 63 minutes Total: Thigh (Right) - 85 minutes   PATIENT DISPOSITION:  PACU - hemodynamically stable.   Delay start of Pharmacological VTE agent (>24hrs) due to surgical blood loss or risk of bleeding: no  INDICATION FOR PROCEDURE: SATANYA Gomez is a 66 y.o. female with atherosclerosis of native arteries of right lower extremity with worsening ulceration of the right great toe. Angiogram revealed heavily diseased arteries globally. After careful discussion of risks, benefits, and alternatives the patient  was offered right femoral-popliteal bypass.  We had a lengthy discussion about her perioperative risk given her multiple medical comorbidities. The patient understood and wished to proceed.  OPERATIVE FINDINGS: Extensive, heavily calcified plaque globally.  Much worse than angiogram suggested.  Extensive right iliofemoral and extended profundoplasty performed to try to get inflow to the leg.  The dominant branch of the profunda artery is occluded.  Inflow to the thigh maximized.  Below-knee popliteal artery heavily diseased.  First attempted anastomosis was not successful.  Angiography revealed only retrograde filling into the behind knee popliteal artery.  The anastomosis was taken down in a popliteal and tibioperoneal trunk endarterectomy and bovine pericardial patch angioplasty performed.  The distal bypass graft was then anastomosed to the patch.  No Doppler flow in the foot at the end of the case.  Angiogram shows anastomosis is widely patent.  Doppler flow noted in the distal tibioperoneal trunk and proximal anterior tibial artery.  No further options for this patient from a revascularization standpoint.  DESCRIPTION OF PROCEDURE: After identification of the patient in the pre-operative holding area, the patient was transferred to the operating room. The patient was positioned supine on the operating room table. Anesthesia was induced. The right leg [place] which using to help was prepped and draped in standard fashion. A surgical pause was performed confirming correct patient, procedure, and operative location.  Longitudinal incision was made in the right groin and carried down through subtenons tissue until the common femoral artery and its bifurcation were identified.  The artery was skeletonized from the external iliac to the second order branches of the right profunda femoris artery.  A Walter retractor was  used to help hold the soft tissue of the groin out of the way.  The circumflex iliac vein  was identified and divided.  Heavily diseased iliofemoral artery was identified.  There was no suitable site for clamping.  I ultimately was able to identify a soft area of profunda femoris artery about 10 cm beyond the bifurcation of the common femoral artery.  Loops were placed around the arteries.  Attention was turned to the popliteal artery.  Longitudinal incision was made in the proximal, medial right calf to expose the below-knee popliteal artery.  Incision was carried down through subtenons tissue with Bovie electrocautery until the fascia of the posterior compartment was identified and divided.  The gastrocnemius was swept posteriorly.  The popliteal vascular bundle was identified.  The tibial nerve was identified and protected.  The arc of the soleus was divided carefully using Bovie electrocautery.  Several crossing branches and the anterior tibial vein were ligated proximally distally and divided to facilitate exposure of the tibioperoneal trunk.  A subfascial tunnel was developed between the 2 vascular exposures using a Kelly-Wick tunneling device.  The patient was systemically heparinized.  Activated clotting time measurements were used throughout the case to confirm adequate anticoagulation.  A small arteriotomy was made over a soft area in the common femoral artery anterior wall.  A #4 Fogarty embolectomy catheter was advanced centrally into the proximal external iliac artery and gently inflated.  Inflow control was achieved.  Clamps and loops were placed around the distal branches of the profunda femoris artery.  Superficial femoral artery was chronically occluded and ligated at its origin.  The superficial femoral artery was then transected off of the common femoral artery.  An anterior arteriotomy was made with Potts scissors from the distal external iliac artery to the second order branches of the profunda femoris artery.  I ultimately had to extend my endarterectomy further than  anticipated given the lack of outflow, given severe disease.  I ultimately identified that the main branch of the profunda femoris artery was chronically occluded and stopped about 10 cm beyond the bifurcation the common femoral artery.  The external iliac, common femoral, and profunda femoris artery were then carefully endarterectomized using a Astronomer.  All debris was manually removed.  The endarterectomy plane was carefully inspected with loupe magnification.  A bovine pericardial patch was brought in the field and sewn to the endarterectomy using standard technique.  As noted earlier, I did have to extend the endarterectomy and ultimately used the entire patch to complete my endarterectomy.  A 6 mm externally supported PTFE vascular graft was brought in the field and delivered through the tunneling device between the 2 exposures.  Clamps were applied to the endarterectomized common femoral artery proximally distally.  An anterior arteriotomy was made on the patch with an 11 blade extended with Potts scissors.  The proximal anastomosis was performed using continuous running suture of 6-0 Prolene.  Hemostasis was achieved in the anastomoses.  The anastomosis was flushed down the open end of the vascular graft.  Attention was turned to the below-knee popliteal artery  The below-knee popliteal artery was heavily diseased and there was not a soft area to open.  Pneumatic tourniquet was applied to the thigh.  The leg was exsanguinated with an Esmarch tourniquet.  The pneumatic tourniquet was inflated I opened the diseased artery with a 11 blade and extended the arteriotomy with Potts scissors.  I attempted to sew the distal anastomosis without further intervention.  The  pneumatic tourniquet was deflated after completing this, completion angiogram showed no filling of the leg in antegrade fashion.  I took the anastomosis down.  The tourniquet was reinflated.  I extended the arteriotomy on the popliteal  artery onto the tibioperoneal trunk.  Extensive endarterectomy was performed across the origin of the anterior tibial artery and onto the tibioperoneal trunk.  A coronary dilator easily passed into the distal tibioperoneal trunk.  A bovine pericardial patch was brought on the field and sewn in standard technique to the endarterectomy surface using continuous running suture of 6-0 Prolene.  An anterior arteriotomy was made on the bovine pericardial patch and used as a distal anastomotic target.  The distal vascular graft was then sewn to the arteriotomy using continuous running suture of 6-0 Prolene.  After completion, the patch was flushed and de-aired.  Repair stitches were needed to achieve hemostasis in the patch.  No Doppler flow was noted in the foot.  I performed on table angiography.  The distal anastomosis appeared widely patent.  The anterior tibial artery did not seem to fill directly from the bypass.  The tibioperoneal trunk and peroneal artery did opacify.  I interrogated the anterior tibial artery with a Doppler machine.  I was able to hear antegrade flow with systolic and diastolic signals.  I felt this was her best option available to her based on her angiography results.  I reviewed the angiogram with 2 partners.  They agreed.  We ended the case here.  Heparin was reversed with protamine.  The wounds were closed in layers using 2-0 Vicryl, 3-0 Vicryl.  The calf incision was closed with Monocryl suture in subcuticular fashion.  The groin was closed using 2-0 nylon and surgical stapler.  A Prevena dressing was applied to the groin.  Upon completion of the case instrument and sharps counts were confirmed correct. The patient was transferred to the PACU in good condition. I was present for all portions of the procedure.  Rande Brunt. Lenell Antu, MD [facility] Phone Number: [phone] 11/28/2021 2:54 PM

## 2021-11-28 NOTE — Progress Notes (Signed)
PROGRESS NOTE ? ?Patient seen in PACU postoperatively. Discussed difficult case with Dr. Stanford Breed. Significant (as expected) vascular disease noted, though case was completed without complication. Extubated without issue. Pt has gotten albumin and ordered to get 2u PRBCs. MAP in low 60's with ideal MAP 11mmHg for perfusion. She will need to be monitored more closely in ICU. I've contacted PCCM and written for levophed for now.  ? ? ?Patrecia Pour, MD ?Pager on amion ?11/28/2021, 3:51 PM   ?

## 2021-11-28 NOTE — Anesthesia Postprocedure Evaluation (Signed)
Anesthesia Post Note ? ?Patient: Tina Gomez ? ?Procedure(s) Performed: RIGHT FEMORAL-POPLITEAL ARTERY BYPASS WITH 6MM REMOVABLE RING GORE GRAFT WITH EXTENDED PERFUNDAPLASTY (Right: Leg Upper) ?ENDARTERECTOMY FEMORAL (Right: Leg Upper) ?PATCH ANGIOPLASTY USING BOVINE PERICARDIUM XENOSURE PATCH (Right: Leg Upper) ?INTRA OPERATIVE ARTERIOGRAM (Right: Leg Lower) ? ?  ? ?Patient location during evaluation: PACU ?Anesthesia Type: General ?Level of consciousness: awake and alert ?Pain management: pain level controlled ?Vital Signs Assessment: post-procedure vital signs reviewed and stable ?Respiratory status: spontaneous breathing, nonlabored ventilation and respiratory function stable ?Cardiovascular status: blood pressure returned to baseline and stable ?Postop Assessment: no apparent nausea or vomiting ?Anesthetic complications: no ? ? ?No notable events documented. ? ?Last Vitals:  ?Vitals:  ? 11/28/21 1515 11/28/21 1517  ?BP:  (!) 104/56  ?Pulse: (!) 54 (!) 57  ?Resp: 15 (!) 22  ?Temp:    ?SpO2: 98% 100%  ?  ?Last Pain:  ?Vitals:  ? 11/28/21 1502  ?TempSrc:   ?PainSc: Asleep  ? ? ?  ?  ?  ?  ?  ?  ? ?Lidia Collum ? ? ? ? ?

## 2021-11-28 NOTE — Progress Notes (Signed)
Pt receives out-pt HD at [facility] on MWF,Sat. ?Pt's chair time on MWF is 10:45 and on Saturday 11:00 chair. Will assist as needed.  ? ?Melven Sartorius ?Renal Navigator ?[phone] ?

## 2021-11-28 NOTE — Progress Notes (Signed)
Seen in recovery area. ?Hypotensive to 70s/80s. ?Started levophed. ?Giving 2u PRBC. ?Brisk doppler flow in AT / PT.  ?Hopeful that this bypass will get her out of pain. ?I counseled the family that we will not return to the OR if the bypass thromboses given lack of distal options. They are in agreement. ?Will transfer to ICU. ?Discussed with Dr. Bonner Puna and ICU team at bedside. Appreciate their help. ? ?Tina Gomez. Stanford Breed, MD ?[facility] ?Office Phone Number: [phone] ?11/28/2021 4:37 PM ? ?

## 2021-11-28 NOTE — Plan of Care (Signed)

## 2021-11-29 ENCOUNTER — Encounter (HOSPITAL_COMMUNITY): Payer: Self-pay | Admitting: Vascular Surgery

## 2021-11-29 ENCOUNTER — Other Ambulatory Visit (HOSPITAL_COMMUNITY): Payer: Self-pay

## 2021-11-29 DIAGNOSIS — N186 End stage renal disease: Secondary | ICD-10-CM | POA: Diagnosis not present

## 2021-11-29 DIAGNOSIS — J9611 Chronic respiratory failure with hypoxia: Secondary | ICD-10-CM | POA: Diagnosis not present

## 2021-11-29 DIAGNOSIS — I70223 Atherosclerosis of native arteries of extremities with rest pain, bilateral legs: Secondary | ICD-10-CM | POA: Diagnosis not present

## 2021-11-29 DIAGNOSIS — Z95828 Presence of other vascular implants and grafts: Secondary | ICD-10-CM

## 2021-11-29 DIAGNOSIS — I5022 Chronic systolic (congestive) heart failure: Secondary | ICD-10-CM | POA: Diagnosis not present

## 2021-11-29 LAB — CBC
HCT: 32 % — ABNORMAL LOW (ref 36.0–46.0)
HCT: 31.8 % — ABNORMAL LOW (ref 36.0–46.0)
Hemoglobin: 9.8 g/dL — ABNORMAL LOW (ref 12.0–15.0)
Hemoglobin: 9.9 g/dL — ABNORMAL LOW (ref 12.0–15.0)
MCH: 28.6 pg (ref 26.0–34.0)
MCH: 28.7 pg (ref 26.0–34.0)
MCHC: 30.6 g/dL (ref 30.0–36.0)
MCHC: 31.1 g/dL (ref 30.0–36.0)
MCV: 93.3 fL (ref 80.0–100.0)
MCV: 92.2 fL (ref 80.0–100.0)
Platelets: 192 10*3/uL (ref 150–400)
Platelets: 183 10*3/uL (ref 150–400)
RBC: 3.43 MIL/uL — ABNORMAL LOW (ref 3.87–5.11)
RBC: 3.45 MIL/uL — ABNORMAL LOW (ref 3.87–5.11)
RDW: 16.5 % — ABNORMAL HIGH (ref 11.5–15.5)
RDW: 16.7 % — ABNORMAL HIGH (ref 11.5–15.5)
WBC: 14.8 10*3/uL — ABNORMAL HIGH (ref 4.0–10.5)
WBC: 11.8 10*3/uL — ABNORMAL HIGH (ref 4.0–10.5)
nRBC: 0 % (ref 0.0–0.2)
nRBC: 0 % (ref 0.0–0.2)

## 2021-11-29 LAB — TYPE AND SCREEN
ABO/RH(D): O POS
Antibody Screen: NEGATIVE
Unit division: 0
Unit division: 0

## 2021-11-29 LAB — RENAL FUNCTION PANEL
Albumin: 3 g/dL — ABNORMAL LOW (ref 3.5–5.0)
Anion gap: 7 (ref 5–15)
BUN: 23 mg/dL (ref 8–23)
CO2: 29 mmol/L (ref 22–32)
Calcium: 8.2 mg/dL — ABNORMAL LOW (ref 8.9–10.3)
Chloride: 101 mmol/L (ref 98–111)
Creatinine, Ser: 4.14 mg/dL — ABNORMAL HIGH (ref 0.44–1.00)
GFR, Estimated: 11 mL/min — ABNORMAL LOW (ref >60)
Glucose, Bld: 111 mg/dL — ABNORMAL HIGH (ref 70–99)
Phosphorus: 4.6 mg/dL (ref 2.5–4.6)
Potassium: 3.9 mmol/L (ref 3.5–5.1)
Sodium: 137 mmol/L (ref 135–145)

## 2021-11-29 LAB — BPAM RBC
Blood Product Expiration Date: 202304152359
Blood Product Expiration Date: 202304152359
ISSUE DATE / TIME: 202303221537
ISSUE DATE / TIME: 202303221748
Unit Type and Rh: 5100
Unit Type and Rh: 5100

## 2021-11-29 LAB — HEPARIN LEVEL (UNFRACTIONATED): Heparin Unfractionated: <0.1 IU/mL — ABNORMAL LOW (ref 0.30–0.70)

## 2021-11-29 LAB — BASIC METABOLIC PANEL
Anion gap: 11 (ref 5–15)
BUN: 56 mg/dL — ABNORMAL HIGH (ref 8–23)
CO2: 23 mmol/L (ref 22–32)
Calcium: 8.3 mg/dL — ABNORMAL LOW (ref 8.9–10.3)
Chloride: 103 mmol/L (ref 98–111)
Creatinine, Ser: 7.51 mg/dL — ABNORMAL HIGH (ref 0.44–1.00)
GFR, Estimated: 6 mL/min — ABNORMAL LOW (ref >60)
Glucose, Bld: 129 mg/dL — ABNORMAL HIGH (ref 70–99)
Potassium: 4.8 mmol/L (ref 3.5–5.1)
Sodium: 137 mmol/L (ref 135–145)

## 2021-11-29 LAB — GLUCOSE, CAPILLARY
Glucose-Capillary: 137 mg/dL — ABNORMAL HIGH (ref 70–99)
Glucose-Capillary: 111 mg/dL — ABNORMAL HIGH (ref 70–99)
Glucose-Capillary: 101 mg/dL — ABNORMAL HIGH (ref 70–99)
Glucose-Capillary: 106 mg/dL — ABNORMAL HIGH (ref 70–99)
Glucose-Capillary: 115 mg/dL — ABNORMAL HIGH (ref 70–99)

## 2021-11-29 LAB — HEPATITIS B SURFACE ANTIBODY,QUALITATIVE: Hep B S Ab: REACTIVE — AB

## 2021-11-29 LAB — APTT: aPTT: 37 seconds — ABNORMAL HIGH (ref 24–36)

## 2021-11-29 LAB — HEPATITIS B SURFACE ANTIGEN: Hepatitis B Surface Ag: NONREACTIVE

## 2021-11-29 MED ORDER — PROCHLORPERAZINE EDISYLATE 10 MG/2ML IJ SOLN
10.0000 mg | Freq: Once | INTRAMUSCULAR | Status: AC
  Administered 2021-11-29: 10 mg via INTRAVENOUS
  Filled 2021-11-29: qty 2

## 2021-11-29 MED ORDER — HYDROMORPHONE HCL 1 MG/ML IJ SOLN
1.0000 mg | INTRAMUSCULAR | Status: DC | PRN
  Administered 2021-11-29 – 2021-12-03 (×21): 1 mg via INTRAVENOUS
  Filled 2021-11-29 (×24): qty 1

## 2021-11-29 MED ORDER — ARFORMOTEROL TARTRATE 15 MCG/2ML IN NEBU
15.0000 ug | INHALATION_SOLUTION | Freq: Two times a day (BID) | RESPIRATORY_TRACT | Status: DC
  Administered 2021-11-29 – 2021-12-02 (×7): 15 ug via RESPIRATORY_TRACT
  Filled 2021-11-29 (×7): qty 2

## 2021-11-29 MED ORDER — BUDESONIDE 0.25 MG/2ML IN SUSP
0.2500 mg | Freq: Two times a day (BID) | RESPIRATORY_TRACT | Status: DC
  Administered 2021-11-29 – 2021-12-02 (×7): 0.25 mg via RESPIRATORY_TRACT
  Filled 2021-11-29 (×7): qty 2

## 2021-11-29 MED ORDER — REVEFENACIN 175 MCG/3ML IN SOLN
175.0000 ug | Freq: Every day | RESPIRATORY_TRACT | Status: DC
  Administered 2021-11-29 – 2021-12-02 (×4): 175 ug via RESPIRATORY_TRACT
  Filled 2021-11-29 (×5): qty 3

## 2021-11-29 NOTE — Procedures (Signed)
Pt 1 day s/p surgical procedure. HD at bedside, consent and orders verified, UF goal of 2500 mls including prime/rinse. AxOx3, vitals WNL and pt appropriate for HD at this time. Arterial line present but with poor wave form, lungs sounds diminished, heart regular, abdominal sounds present, without notable edema. L AV Fistula bruit/ thrill without complications. ?   ?HD tx completed in profile 2 with aid of Levophed, pt experienced several episodes of hypotension causing nausea, relieved with 200 ml bolus and pressor.  Net Uf removed 1000 mls, goal lowered during MD rounding. No abnormal findings present, baseline met, appears to be stable. Hemostasis achieved, no concerns present at this time. ? ? ?

## 2021-11-29 NOTE — Progress Notes (Signed)
? ?  Inpatient Rehab Admissions Coordinator : ? ?Per therapy recommendations, patient was screened for CIR candidacy by Loyed Wilmes RN MSN.  At this time patient appears to be a potential candidate for CIR. I will place a rehab consult per protocol for full assessment. Please call me with any questions. ? ?Skylor Schnapp RN MSN ?Admissions Coordinator ?[phone] ?  ?

## 2021-11-29 NOTE — Progress Notes (Addendum)
?[facility] ?Progress Note  ? ?Subjective: Seen in room. Now off pressors. BP stable. Denies SOB on [place]. HD earlier today off schedule. HD 11/30/2021 to resume MWF schedule.     ? ?Objective ?Vitals:  ? 11/29/21 1100 11/29/21 1115 11/29/21 1200 11/29/21 1300  ?BP: (!) 130/46 (!) 130/46 (!) 124/55 (!) 125/49  ?Pulse: (!) 55 (!) 54 60 (!) 55  ?Resp: 10 12 11 12   ?Temp:      ?TempSrc:      ?SpO2: 100%  100% 100%  ?Weight:      ?Height:      ? ?Physical Exam ?General: Chronically ill appearing female in NAD ?Heart: S1,S2 RRR No M/R/G ?Lungs: CTAB. Respirations very shallow. On 100% NRB.  ?Abdomen: NABS, foley (!!!) to gravity drain, minimal UOP.  ?Extremities: No LE edema. Wound vac intact R femoral area.  ?Dialysis Access: L AVF + T/B ?Lines: R Radial Aline, REJ IV, PIV RFA, PIV L foot.  ?Dialysis Orders: ? ?Additional Objective ?Labs: ?Basic Metabolic Panel: ?Recent Labs  ?Lab 11/26/21 ?0155 11/27/21 ?0303 11/28/21 ?0230 11/28/21 ?1401 11/29/21 ?0439  ?NA 135 136 137 136 137  ?K 4.4 4.0 4.2 4.8 4.8  ?CL 99 98 98  --  103  ?CO2 25 27 27   --  23  ?GLUCOSE 137* 134* 125*  --  129*  ?BUN 52* 35* 48*  --  56*  ?CREATININE 7.19* 5.26* 6.66*  --  7.51*  ?CALCIUM 8.5* 8.7* 8.9  --  8.3*  ?PHOS 6.9* 5.8* 6.2*  --   --   ? ?Liver Function Tests: ?Recent Labs  ?Lab 11/26/21 ?0155 11/27/21 ?0303 11/28/21 ?0230  ?ALBUMIN 2.7* 2.8* 2.7*  ? ?No results for input(s): LIPASE, AMYLASE in the last 168 hours. ?CBC: ?Recent Labs  ?Lab 11/26/21 ?0155 11/27/21 ?0303 11/28/21 ?0230 11/28/21 ?1401 11/28/21 ?1510 11/29/21 ?0439  ?WBC 6.2 6.7 6.7  --  13.6* 14.8*  ?HGB 9.4* 9.7* 9.2* 8.2* 7.0* 9.8*  ?HCT 31.9* 34.3* 31.5* 24.0* 24.1* 32.0*  ?MCV 92.7 93.5 91.8  --  93.4 93.3  ?PLT 179 208 207  --  212 192  ? ?Blood Culture ?   ?Component Value Date/Time  ? SDES EXPECTORATED SPUTUM 09/13/2021 0459  ? SDES EXPECTORATED SPUTUM 09/13/2021 0459  ? Joanna NONE 09/13/2021 0459  ? SPECREQUEST NONE Reflexed from [id number] 09/13/2021  0459  ? CULT  09/13/2021 0459  ?  FEW Normal respiratory flora-no Staph aureus or Pseudomonas seen ?Performed at [facility], [place] [address] [place], [place] [address] ?  ? REPTSTATUS 09/13/2021 FINAL 09/13/2021 0459  ? REPTSTATUS 09/15/2021 FINAL 09/13/2021 0459  ? ? ?Cardiac Enzymes: ?No results for input(s): CKTOTAL, CKMB, CKMBINDEX, TROPONINI in the last 168 hours. ?CBG: ?Recent Labs  ?Lab 11/28/21 ?1230 11/28/21 ?1501 11/28/21 ?2125 11/29/21 ?0700 11/29/21 ?1126  ?GLUCAP 96 105* 140* 111* 101*  ? ?Iron Studies: No results for input(s): IRON, TIBC, TRANSFERRIN, FERRITIN in the last 72 hours. ?@lablastinr3 @ ?Studies/Results: ?DG C-Arm 1-60 Min-No Report ? ?Result Date: 11/28/2021 ?Fluoroscopy was utilized by the requesting physician.  No radiographic interpretation.  ? ?DG C-Arm 1-60 Min-No Report ? ?Result Date: 11/28/2021 ?Fluoroscopy was utilized by the requesting physician.  No radiographic interpretation.  ? ?DG C-Arm 1-60 Min-No Report ? ?Result Date: 11/28/2021 ?Fluoroscopy was utilized by the requesting physician.  No radiographic interpretation.   ?Medications: ? sodium chloride    ? sodium chloride    ? sodium chloride 75 mL/hr at 11/28/21 1523  ? sodium  chloride    ? sodium chloride    ? sodium chloride    ? heparin 500 Units/hr (11/29/21 1100)  ? iron sucrose    ? magnesium sulfate bolus IVPB    ? norepinephrine (LEVOPHED) Adult infusion 2 mcg/min (11/29/21 0929)  ? ? arformoterol  15 mcg Nebulization BID  ? aspirin  325 mg Oral Daily  ? atorvastatin  80 mg Oral q1800  ? budesonide (PULMICORT) nebulizer solution  0.25 mg Nebulization BID  ? carvedilol  12.5 mg Oral QHS  ? And  ? carvedilol  12.5 mg Oral Once per day on Sun Tue Thu  ? Chlorhexidine Gluconate Cloth  6 each Topical Daily  ? darbepoetin (ARANESP) injection - DIALYSIS  25 mcg Intravenous Q Mon-HD  ? docusate sodium  100 mg Oral Daily  ? doxercalciferol  1 mcg Intravenous Q M,W,F-HD  ? FLUoxetine  20 mg Oral QHS  ? gabapentin  200 mg  Oral QHS  ? insulin aspart  0-6 Units Subcutaneous TID WC  ? losartan  100 mg Oral Daily  ? multivitamin  1 tablet Oral QHS  ? pantoprazole  40 mg Oral Daily  ? revefenacin  175 mcg Nebulization Daily  ? sodium chloride flush  3 mL Intravenous Q12H  ? sodium chloride flush  3 mL Intravenous Q12H  ? sucroferric oxyhydroxide  500 mg Oral TID WC  ? ? ? ?Dialysis Orders: ?Andree Elk form MWFS 3 hours EDW 75 kg ?3K, 2.5 CA bath UF P2 ?Heparin 5000 units IV Initial bolus 2000 units IV mid run ?Hectorol 1 mcg IV TIW ?Venofer 50 mg IV weekly ?  ?  ?Assessment/Plan: ?Bilateral lower extremity critical limb extremity ischemia-Followed by [facility]. Needs staged bilateral femoral endarterectomies and FPB. R To OR 11/28/2021 per Dr. Stanford Breed. S/P Right external iliac, common femoral and extensive right profunda femoris endarterectomy, Right popliteal and tibioperoneal trunk endarterectomy and bovine patch angioplasty, Right common femoral to tibioperoneal trunk bypass with 12mm ePTFE, RLE angiogram. Per primary, VVS.  ?ESRD -HD [facility]FS. Going to OR 11/28/2021. Next HD 03/23 off schedule the again 11/30/2021 to get back on schedule.  ?Hypertension/volume  -No evidence of volume overload by exam. HD earlier today. Net UF not charted. Continue to UF as tolerated.  ?Anemia  -HGB 9.2 fell to 7.0 intraoperatively. S/P 2 units PRBCS  11/28/2021. HGB 9.8. Decrease ESA dose, give today.  Continue weekly Venofer. Follow HBG.   ?Metabolic bone disease -Continue binders, VDRA.  ?History of HFrEF (last EF 30-35% with G1DD 11/24/2021). Optimize volume with HD. Reminded patient to adhere to fluid restrictions.  ?COPD/tobacco abuse-Meds per primary ?DM type II -Per primary.  ?Nutrition -ALB low.  diet renal/carb modified renal with fluid restriction renal vitamin protein supplement. ?History of anxiety/depression- Per primary ? ?Jaise Moser H. Sarahgrace Broman NP-C ?11/29/2021, 1:40 PM  ?[facility] ?[phone] ? ? ?  ? ?

## 2021-11-29 NOTE — Progress Notes (Signed)
? ?NAME:  Tina Gomez, MRN:  [id number], DOB:  10-15-1955, LOS: 8 ?ADMISSION DATE:  11/21/2021, CONSULTATION DATE:  11/28/21 ?REFERRING MD:  Stanford Breed, CHIEF COMPLAINT:  critical limb ischemia  ? ?History of Present Illness:  ?Tina Gomez is a 66 year old chronically ill female with severe vascular disease who was admitted on 11/21/2021 for bilateral lower extremity pain, found to have critical limb ischemia of the right lower extremity.  She underwent extensive endarterectomy of the right external iliac, common femoral, right profunda femoris, right popliteal and tibial peroneal arteries as well as a femoral to tibial peroneal trunk bypass.  She required some low-dose Levophed following the procedure and critical care was asked to consult for transfer to the ICU. ? ?Pertinent  Medical History  ?PAD, CAD, HFrEF (EF 30%), history of stroke, hyperlipidemia, hypertension ?End-stage renal disease on dialysis ?COPD with chronic respiratory failure (3 to 4 L at baseline), tobacco use ?Type 2 diabetes ?Depression/anxiety ? ?Significant Hospital Events: ?Including procedures, antibiotic start and stop dates in addition to other pertinent events   ?3/15: Admission to [facility] ?3/22:endarterectomy of the right external iliac, common femoral, right profunda femoris, right popliteal and tibial peroneal arteries as well as a femoral to tibial peroneal trunk bypass.  PCCM consulted for postoperative hypotension ? ?Interim History / Subjective:  ? ?Postop intensive care unit.  Restarting dialysis.  Pressure better. ? ?Objective   ?Blood pressure (!) 161/49, pulse 63, temperature (!) 96.8 ?F (36 ?C), temperature source Axillary, resp. rate 17, height 5\' 2"  (1.575 m), weight 85.1 kg, SpO2 100 %. ?   ?   ? ?Intake/Output Summary (Last 24 hours) at 11/29/2021 0836 ?Last data filed at 11/29/2021 0600 ?Gross per 24 hour  ?Intake 2594.78 ml  ?Output 740 ml  ?Net 1854.78 ml  ? ?Filed Weights  ? 11/28/21 0500 11/28/21 0622 11/29/21 0500  ?Weight:  78.7 kg 78.7 kg 85.1 kg  ? ? ?Examination: ?General: Chronically ill-appearing female lying in bed ?Cardiac: Regular rhythm, S1-S2 ?Pulm: Comfortable, diminished breath sounds in the bases, no crackles no wheeze ?Abdomen: Soft, nontender nondistended ?Neuro: Alert oriented following commands ?Skin: Incision sites clean dry and intact ? ? ?Resolved Hospital Problem list   ? ? ?Assessment & Plan:  ? ?Postoperative hypotension ?Critical limb ischemia status post extensive endarterectomy with femoral-tibial peroneal bypass ?Acute blood loss anemia ?Plan: ?Off vasopressors. ?Remains close observation for reinitiation with the start of HD.  Still a little tenuous.  Suspect may require low-dose vasopressor during first session of HD postop. ?Continue heparin infusion per vascular surgery ? ?COPD with chronic respiratory failure, tobacco use disorder ?-wean supplemental oxygen to baseline 4L as able. Maintain O2 saturations 88-92% ?Start Brovana, Pulmicort, yupelri  ?As needed albuterol ? ?Type 2 diabetes mellitus ?-CBGs with SSI ? ?History of hypertension ?Non-obstructive CAD ([facility] 11/2020) ?HFrEF (EF 30-35%) ?Continue to hold antihypertensives ?Once tolerating HD we can consider restart of home heart failure regimen ? ?ESRD on HD ?-management per nephrology ? ?Best Practice (right click and "Reselect all SmartList Selections" daily)  ? ?Diet/type: per primary ?DVT prophylaxis: heparin gtt ?Lines: Right EJ, left foot, right anticubital ?Code Status:  DNR ?Last date of multidisciplinary goals of care discussion [per primary] ? ?Labs   ?CBC: ?Recent Labs  ?Lab 11/26/21 ?0155 11/27/21 ?0303 11/28/21 ?0230 11/28/21 ?1401 11/28/21 ?1510 11/29/21 ?0439  ?WBC 6.2 6.7 6.7  --  13.6* 14.8*  ?HGB 9.4* 9.7* 9.2* 8.2* 7.0* 9.8*  ?HCT 31.9* 34.3* 31.5* 24.0* 24.1* 32.0*  ?MCV 92.7  93.5 91.8  --  93.4 93.3  ?PLT 179 208 207  --  212 192  ? ? ?Basic Metabolic Panel: ?Recent Labs  ?Lab 11/24/21 ?0318 11/26/21 ?0155 11/27/21 ?0303  11/28/21 ?0230 11/28/21 ?1401 11/29/21 ?0439  ?NA 134* 135 136 137 136 137  ?K 4.8 4.4 4.0 4.2 4.8 4.8  ?CL 94* 99 98 98  --  103  ?CO2 24 25 27 27   --  23  ?GLUCOSE 87 137* 134* 125*  --  129*  ?BUN 65* 52* 35* 48*  --  56*  ?CREATININE 8.09* 7.19* 5.26* 6.66*  --  7.51*  ?CALCIUM 9.0 8.5* 8.7* 8.9  --  8.3*  ?PHOS 9.4* 6.9* 5.8* 6.2*  --   --   ? ?GFR: ?Estimated Creatinine Clearance: 7.7 mL/min (A) (by C-G formula based on SCr of 7.51 mg/dL (H)). ?Recent Labs  ?Lab 11/27/21 ?0303 11/28/21 ?0230 11/28/21 ?1510 11/29/21 ?0439  ?WBC 6.7 6.7 13.6* 14.8*  ? ? ?Liver Function Tests: ?Recent Labs  ?Lab 11/24/21 ?0318 11/26/21 ?0155 11/27/21 ?0303 11/28/21 ?0230  ?ALBUMIN 2.8* 2.7* 2.8* 2.7*  ? ?No results for input(s): LIPASE, AMYLASE in the last 168 hours. ?No results for input(s): AMMONIA in the last 168 hours. ? ?ABG ?   ?Component Value Date/Time  ? PHART 7.307 (L) 11/28/2021 1401  ? PCO2ART 50.1 (H) 11/28/2021 1401  ? PO2ART 96 11/28/2021 1401  ? HCO3 25.1 11/28/2021 1401  ? TCO2 27 11/28/2021 1401  ? ACIDBASEDEF 1.0 11/28/2021 1401  ? O2SAT 97 11/28/2021 1401  ?  ? ?Coagulation Profile: ?No results for input(s): INR, PROTIME in the last 168 hours. ? ? ?Cardiac Enzymes: ?No results for input(s): CKTOTAL, CKMB, CKMBINDEX, TROPONINI in the last 168 hours. ? ?HbA1C: ?Hemoglobin A1C  ?Date/Time Value Ref Range Status  ?11/22/2021 04:01 PM 6.3 (A) 4.0 - 5.6 % Final  ? ?HbA1c, POC (controlled diabetic range)  ?Date/Time Value Ref Range Status  ?08/14/2021 11:40 AM 6.4 0.0 - 7.0 % Final  ?05/23/2020 04:13 PM 7.6 (A) 0.0 - 7.0 % Final  ? ?Hgb A1c MFr Bld  ?Date/Time Value Ref Range Status  ?04/06/2021 03:08 AM 6.2 (H) 4.8 - 5.6 % Final  ?  Comment:  ?  (NOTE) ?Pre diabetes:          5.7%-6.4% ? ?Diabetes:              >6.4% ? ?Glycemic control for   <7.0% ?adults with diabetes ?  ?11/08/2020 04:42 AM 6.4 (H) 4.8 - 5.6 % Final  ?  Comment:  ?  (NOTE) ?Pre diabetes:          5.7%-6.4% ? ?Diabetes:               >6.4% ? ?Glycemic control for   <7.0% ?adults with diabetes ?  ? ? ?CBG: ?Recent Labs  ?Lab 11/28/21 ?1037 11/28/21 ?1230 11/28/21 ?1501 11/28/21 ?2125 11/29/21 ?0700  ?GLUCAP 137* 96 105* 140* 111*  ? ? ?Review of Systems:   ?Unable to be obtained due to encephalopathy ? ?Past Medical History:  ?She,  has a past medical history of Anemia, CAD (coronary artery disease), Chronic respiratory failure with hypoxia ([place]), COPD (chronic obstructive pulmonary disease) ([place]) (05/2019), Diabetes mellitus without complication ([place]), ESRD on hemodialysis ([place]), History of blood transfusion (09/2020), HLD (hyperlipidemia), Hypertension, Peripheral vascular disease ([place]), PONV (postoperative nausea and vomiting), Restless legs syndrome (RLS), and Stroke ([place]).  ? ?Surgical History:  ? ?Past Surgical History:  ?Procedure Laterality Date  ?  AV FISTULA PLACEMENT Left 07/27/2020  ? Procedure: LEFT BRACHIO-BASILIC ARTERIOVENOUS (AV) FISTULA CREATION;  Surgeon: Cherre Robins, MD;  Location: [facility];  Service: Vascular;  Laterality: Left;  ? BASCILIC VEIN TRANSPOSITION Left 11/02/2020  ? Procedure: LEFT UPPER ARM SECOND STAGE BASILIC VEIN TRANSPOSITION;  Surgeon: Cherre Robins, MD;  Location: [facility] OR;  Service: Vascular;  Laterality: Left;  PERIPHERAL NERVE BLOCK  ? CESAREAN SECTION    ? x 1  ? ESOPHAGOGASTRODUODENOSCOPY (EGD) WITH PROPOFOL N/A 05/26/2020  ? Procedure: ESOPHAGOGASTRODUODENOSCOPY (EGD) WITH PROPOFOL;  Surgeon: Irene Shipper, MD;  Location: [facility] ENDOSCOPY;  Service: Endoscopy;  Laterality: N/A;  ? INSERTION OF DIALYSIS CATHETER Right 07/27/2020  ? Procedure: INSERTION OF RIGHT INTERNAL JUGULAR TUNNELED DIALYSIS CATHETER;  Surgeon: Cherre Robins, MD;  Location: [facility];  Service: Vascular;  Laterality: Right;  ? IR FLUORO GUIDE CV LINE RIGHT  07/25/2020  ? IR US GUIDE VASC ACCESS RIGHT  07/25/2020  ? LEFT HEART CATH AND CORONARY ANGIOGRAPHY N/A 11/28/2020  ? Procedure: LEFT HEART CATH AND CORONARY ANGIOGRAPHY;  Surgeon:  Troy Sine, MD;  Location: [facility] CV LAB;  Service: Cardiovascular;  Laterality: N/A;  ? LOWER EXTREMITY ANGIOGRAPHY N/A 11/23/2021  ? Procedure: Lower Extremity Angiography;  Surgeon: Cherre Robins, MD;  Location:

## 2021-11-29 NOTE — TOC Benefit Eligibility Note (Signed)
Patient Advocate Encounter ? ?Insurance verification completed.   ? ?The patient is currently admitted and upon discharge could be taking Eliquis 5 mg. ? ?The current 30 day co-pay is, $0.00.  ? ?The patient is insured through [employer] Medicare Part D  ? ? ? ?Dennisha Mouser, CPhT ?Pharmacy Patient Advocate Specialist ?[facility] Patient Advocate Team ?Direct Number: [phone]  Fax: [phone] ? ? ? ? ? ?  ?

## 2021-11-29 NOTE — TOC Initial Note (Signed)
67 year oldTransition of Care (TOC) - Initial/Assessment Note  ? ? ?Patient Details  ?Name: Tina Gomez ?MRN: [id number] ?Date of Birth: 11/05/1955 ? ?Transition of Care (TOC) CM/SW Contact:    ?Verdell Carmine, RN ?Phone Number: ?11/29/2021, 12:41 PM ? ?Clinical Narrative:                 ?66 year old vasculopath, dialysis dependant comes in with claudication.  She underwent extensive endarterectomy of the right external iliac, common femoral, right profunda femoris, right popliteal and tibial peroneal arteries as well as a femoral to tibial peroneal trunk bypass. Still having some leg pain, on low dose levophed . Breakthrough pain medication and nausea medication ordered last night.  ?Currently in dialysis.  ?May need home health and DME for home, PT and OT will evaluate after dialysis today as time allows.  ? ?CM will follow for needs, recommendations, and transitions ? ?Expected Discharge Plan: [place] ?Barriers to Discharge: Continued Medical Work up ? ? ?Patient Goals and CMS Choice ?  ?  ?  ? ?Expected Discharge Plan and Services ?Expected Discharge Plan: [place] ?  ?  ?  ?Living arrangements for the past 2 months: [place] ?                ?  ?  ?  ?  ?  ?  ?  ?  ?  ?  ? ?Prior Living Arrangements/Services ?Living arrangements for the past 2 months: [place] ?  ?Patient language and need for interpreter reviewed:: Yes ?       ?Need for Family Participation in Patient Care: Yes (Comment) ?Care giver support system in place?: Yes (comment) ?  ?Criminal Activity/Legal Involvement Pertinent to Current Situation/Hospitalization: No - Comment as needed ? ?Activities of Daily Living ?Home Assistive Devices/Equipment: Oxygen, Wheelchair, Walker (specify type) ?ADL Screening (condition at time of admission) ?Patient's cognitive ability adequate to safely complete daily activities?: Yes ?Is the patient deaf or have difficulty hearing?: No ?Does the patient  have difficulty seeing, even when wearing glasses/contacts?: No ?Does the patient have difficulty concentrating, remembering, or making decisions?: No ?Patient able to express need for assistance with ADLs?: Yes ?Does the patient have difficulty dressing or bathing?: Yes ?Independently performs ADLs?: Yes (appropriate for developmental age) ?Does the patient have difficulty walking or climbing stairs?: Yes ?Weakness of Legs: Both ?Weakness of Arms/Hands: None ? ?Permission Sought/Granted ?  ?  ?   ?   ?   ?   ? ?Emotional Assessment ?  ?  ?  ?Orientation: : Oriented to Self, Oriented to Situation, Oriented to  Time, Oriented to Place ?Alcohol / Substance Use: Not Applicable ?Psych Involvement: No (comment) ? ?Admission diagnosis:  Lower limb ischemia [I99.8] ?Critical limb ischemia of both lower extremities with rest pain ([place]) [I70.223] ?S/P femoropopliteal bypass surgery [Z95.828] ?Patient Active Problem List  ? Diagnosis Date Noted  ? S/P femoropopliteal bypass surgery 11/28/2021  ? Critical limb ischemia of both lower extremities ([place]) 11/21/2021  ? COPD (chronic obstructive pulmonary disease) ([place]) 10/12/2021  ? Fall at home, initial encounter 10/12/2021  ? Acute on chronic respiratory failure with hypoxia ([place]) 09/12/2021  ? COPD with acute exacerbation ([place]) 09/12/2021  ? Goals of care, counseling/discussion 09/12/2021  ? Metabolic acidosis, increased anion gap (IAG) 08/03/2021  ? Anxiety and depression 08/03/2021  ? Chronic systolic CHF (congestive heart failure) ([place]) 08/03/2021  ? ESRD on hemodialysis ([place])  04/05/2021  ? Generalized weakness 04/05/2021  ? Chronic respiratory failure with hypoxia ([place]) 04/04/2021  ? CAD (coronary artery disease) 12/13/2020  ? Protein-calorie malnutrition, severe 07/28/2020  ? Diabetic gastroparesis ([place])   ? Abnormal finding on GI tract imaging   ? H. pylori infection   ? Anemia 05/24/2020  ? Tobacco abuse 05/24/2020  ? Emphysema of lung ([place]) 05/24/2020  ? Psoriasis  02/14/2016  ? Type 2 diabetes mellitus with diabetic neuropathy, unspecified ([place]) 01/18/2014  ? Essential hypertension, benign 01/18/2014  ? Hyperlipidemia 01/18/2014  ? ?PCP:  Charlott Rakes, MD ?Pharmacy:   ?[facility] [place] - [address] ?[address] ?[address], [address] ?[place] [address] ?Phone: [phone] Fax: [phone] ? ?[facility] [address] [employer], [address] ?[place] [address] ?Phone: [phone] Fax: [phone] ? ?[facility] ?[address] ?[place] [address] ?Phone: [phone] Fax: [phone] ? ? ? ? ?Social Determinants of Health (SDOH) Interventions ?  ? ?Readmission Risk Interventions ? ?  09/13/2021  ? 11:57 AM 04/13/2021  ?  1:10 PM 07/28/2020  ?  2:50 PM  ?Readmission Risk Prevention Plan  ?Transportation Screening Complete Complete Complete  ?PCP or Specialist Appt within 3-5 Days   Not Complete  ?Not Complete comments   first available PCP is 12/16  ?[place] or Home Care Consult   Complete  ?Social Work Consult for [place] Planning/Counseling   Complete  ?Palliative Care Screening   Not Applicable  ?Medication Review (RN Care Manager) Referral to Pharmacy Complete   ?PCP or Specialist appointment within 3-5 days of discharge Complete Complete   ?[place] or Home Care Consult Complete Complete   ?SW Recovery Care/Counseling Consult Complete Complete   ?Palliative Care Screening Complete Not Applicable   ?[place] Not Applicable Not Applicable   ? ? ? ?

## 2021-11-29 NOTE — Progress Notes (Signed)
ANTICOAGULATION CONSULT NOTE - Initial Consult ? ?Pharmacy Consult for heparin  ?Indication: Critical limb ischemia ? ?Allergies  ?Allergen Reactions  ? Umeclidinium Bromide Anaphylaxis  ? Codeine Nausea And Vomiting  ? Adhesive [Tape] Other (See Comments)  ?  Tape breaks out the skin if it is left on for a lengthy period of time  ? ? ?Patient Measurements: ?Height: 5\' 2"  (157.5 cm) ?Weight: 85.1 kg (187 lb 9.8 oz) ?IBW/kg (Calculated) : 50.1 ?HEPARIN DW (KG): 66.7 ? ? ? ?Vital Signs: ?Temp: 96.8 ?F (36 ?C) (03/23 0827) ?Temp Source: Axillary (03/23 0827) ?BP: 125/49 (03/23 1300) ?Pulse Rate: 55 (03/23 1300) ? ?Labs: ?Recent Labs  ?  11/27/21 ?0303 11/28/21 ?0230 11/28/21 ?1401 11/28/21 ?1510 11/29/21 ?0439  ?HGB 9.7* 9.2* 8.2* 7.0* 9.8*  ?HCT 34.3* 31.5* 24.0* 24.1* 32.0*  ?PLT 208 207  --  212 192  ?APTT  --   --   --   --  37*  ?CREATININE 5.26* 6.66*  --   --  7.51*  ? ? ?Estimated Creatinine Clearance: 7.7 mL/min (A) (by C-G formula based on SCr of 7.51 mg/dL (H)). ? ? ?Medical History: ?Past Medical History:  ?Diagnosis Date  ? Anemia   ? CAD (coronary artery disease)   ? Chronic respiratory failure with hypoxia (H[facility])   ? COPD (chronic obstructive pulmonary disease) ([place]) 05/2019  ? no inhaler  ? Diabetes mellitus without complication ([place])   ? no meds - diet controlled  ? ESRD on hemodialysis ([place])   ? History of blood transfusion 09/2020  ? 1 unit  ? HLD (hyperlipidemia)   ? Hypertension   ? Peripheral vascular disease ([place])   ? PONV (postoperative nausea and vomiting)   ? Restless legs syndrome (RLS)   ? Stroke [facility])   ? ? ?Medications:  ?Medications Prior to Admission  ?Medication Sig Dispense Refill Last Dose  ? acetaminophen (TYLENOL) 500 MG tablet Take 1,000 mg by mouth every 6 (six) hours as needed for mild pain or headache.   Past Month  ? albuterol (PROVENTIL) (2.5 MG/3ML) 0.083% nebulizer solution Take 3 mLs (2.5 mg total) by nebulization every 6 (six) hours as needed for wheezing or shortness of  breath. (Patient taking differently: Take 2.5 mg by nebulization at bedtime.) 75 mL 0 Past Month  ? albuterol (VENTOLIN HFA) 108 (90 Base) MCG/ACT inhaler Inhale 2 puffs into the lungs every 6 (six) hours as needed for wheezing or shortness of breath. 18 g 2 11/20/2021  ? aspirin 325 MG tablet Take 325 mg by mouth daily.   11/20/2021  ? Aspirin-Caffeine (BAYER BACK & BODY PO) Take 1 tablet by mouth daily as needed (headache/pain).   Past Week  ? atorvastatin (LIPITOR) 40 MG tablet Take 1 tablet (40 mg total) by mouth daily at 6 PM.   11/20/2021  ? B Complex-C-Zn-Folic Acid (DIALYVITE 735 WITH ZINC) 0.8 MG TABS Take 1 tablet by mouth at bedtime.   11/20/2021  ? carvedilol (COREG) 12.5 MG tablet Take 1 tablet (12.5 mg total) by mouth 2 (two) times daily with a meal. (Patient taking differently: Take 12.5 mg by mouth See admin instructions. Take one tablet (12.5 mg) by mouth on Sunday, Tuesday, Thursday mornings (non-dialysis days); take one tablet (12.5 mg) daily at bedtime)   11/20/2021 at 2100  ? FLUoxetine (PROZAC) 20 MG capsule Take 1 capsule (20 mg total) by mouth daily. (Patient taking differently: Take 20 mg by mouth at bedtime.) 30 capsule 2 11/20/2021  ? gabapentin (NEURONTIN)  100 MG capsule Take 1 capsule (100 mg total) by mouth 2 (two) times daily. (Patient taking differently: Take 200 mg by mouth at bedtime.) 60 capsule 0 11/20/2021  ? hydrOXYzine (ATARAX) 25 MG tablet Take 25 mg by mouth 3 (three) times daily as needed for anxiety.   unk  ? lidocaine-prilocaine (EMLA) cream Apply 1 application topically See admin instructions. Apply topically to port access one hour prior to dialysis - Monday, Wednesday, Friday, Saturday   11/21/2021  ? loperamide (IMODIUM A-D) 2 MG tablet Take 1 tablet (2 mg total) by mouth 4 (four) times daily as needed for diarrhea or loose stools. (Patient taking differently: Take 2 mg by mouth See admin instructions. Take one tablet (2 mg) by mouth prior to dialysis on Monday, Wednesday,  Friday and Saturday) 30 tablet 1 Past Week  ? losartan (COZAAR) 100 MG tablet Take 1 tablet (100 mg total) by mouth every evening. (Patient taking differently: Take 100 mg by mouth See admin instructions. Take one tablet (100 mg) by mouth at noon on Sunday, Tuesday, Thursday (non-dialysis days); take one tablet (100 mg) at bedtime on Monday, Wednesday, Friday, Saturday (dialysis days)) 30 tablet 6 11/20/2021  ? ondansetron (ZOFRAN) 4 MG tablet Take 1 tablet (4 mg total) by mouth every 8 (eight) hours as needed for nausea or vomiting. 60 tablet 3 unk  ? Tiotropium Bromide Monohydrate (SPIRIVA RESPIMAT) 2.5 MCG/ACT AERS Inhale 2 puffs into the lungs daily. 4 g 3 11/20/2021  ? glucose blood (TRUETEST TEST) test strip Use as instructed 100 each 12   ? Lancets (FREESTYLE) lancets Use as instructed 100 each 12   ? OXYGEN Inhale 4 L/min into the lungs continuous.     ? TRUEplus Lancets 28G MISC 1 each by Does not apply route 3 (three) times daily before meals. 100 each 12   ? ?Scheduled:  ? arformoterol  15 mcg Nebulization BID  ? aspirin  325 mg Oral Daily  ? atorvastatin  80 mg Oral q1800  ? budesonide (PULMICORT) nebulizer solution  0.25 mg Nebulization BID  ? carvedilol  12.5 mg Oral QHS  ? And  ? carvedilol  12.5 mg Oral Once per day on Sun Tue Thu  ? Chlorhexidine Gluconate Cloth  6 each Topical Daily  ? darbepoetin (ARANESP) injection - DIALYSIS  25 mcg Intravenous Q Mon-HD  ? docusate sodium  100 mg Oral Daily  ? doxercalciferol  1 mcg Intravenous Q M,W,F-HD  ? FLUoxetine  20 mg Oral QHS  ? gabapentin  200 mg Oral QHS  ? insulin aspart  0-6 Units Subcutaneous TID WC  ? losartan  100 mg Oral Daily  ? multivitamin  1 tablet Oral QHS  ? pantoprazole  40 mg Oral Daily  ? revefenacin  175 mcg Nebulization Daily  ? sodium chloride flush  3 mL Intravenous Q12H  ? sodium chloride flush  3 mL Intravenous Q12H  ? sucroferric oxyhydroxide  500 mg Oral TID WC  ? ? ?Assessment: ?66 yo female with critical limb ischemia s/p  endarterectomy and bypass (3/22) on heparin at 500 units/hr. Pharmacy to increase to full dose anticoagulation ?-hg= 9.8 ? ?Goal of Therapy:  ?Heparin level 0.3-0.7 units/ml ?Monitor platelets by anticoagulation protocol: Yes ?  ?Plan:  ?-No heparin bolus with recent procedure ?-Increase heparin to 950 units/hr ?-Heparin level in 8 hours and daily wth CBC daily ? ?Hildred Laser, PharmD ?Clinical Pharmacist ?**Pharmacist phone directory can now be found on [email or web address] ([email or web address]).  Listed under [facility]  Pharmacy. ? ? ? ? ? ?

## 2021-11-29 NOTE — Progress Notes (Signed)
? ? ? ?  No active cardiology issues ?Doing fairly well following major vascular surgery yesterday  ? ?Cardiology will sign off. ?Call for questions  ? ? ? ?Mertie Moores, MD  ?11/29/2021 12:31 PM    ?[facility] ?[address]  [address] ?[place], [place]  [address] ?Phone: [phone]; Fax: [phone]  ? ?

## 2021-11-29 NOTE — Evaluation (Addendum)
Physical Therapy Evaluation Patient Details Name: Tina Gomez MRN: [id number] DOB: 26-Oct-1955 Today's Date: 11/29/2021  History of Present Illness  Pt is a 66 y.o. female who presented 11/21/21 for evaluation of lower limb ischemia. S/p CT aortogram and angiogram of lower extremities 3/17. S/p R lower extremity angiogram, endarterectomy, angioplasty, and common femoral to tibioperoneal trunk bypass 3/22 with post-op hypotension. 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   Clinical Impression  Pt presents with condition above and deficits mentioned below, see PT Problem List. PTA, she was mod I using a rollator for mobility, living with her daughter in a multi-level house with a ramped entrance, in which pt remains on the main level. Pt has a hx of falls and is on 3-4L of supplemental O2 via [place] at baseline, per chart review. Currently, pt displays deficits in R lower extremity skin integrity, balance, activity tolerance, and overall strength along with pain s/p surgeries mentioned above. These deficits impact her independence with mobility, requiring maxAx2 to perform bed mobility and perform a stand pivot transfer today. Despite the pain, pt is participatory and desires to improve. As pt has good support at home and has had a significant functional decline, she would likely greatly benefit from intensive therapy in the AIR setting to maximize her return to baseline before returning home. Will continue to follow acutely.     Recommendations for follow up therapy are one component of a multi-disciplinary discharge planning process, led by the attending physician.  Recommendations may be updated based on patient status, additional functional criteria and insurance authorization.  Follow Up Recommendations Acute inpatient rehab (3hours/day)    Assistance Recommended at Discharge Frequent or constant Supervision/Assistance   Patient can return home with the following  A lot of help with walking and/or transfers;Two people to help with walking and/or transfers;A lot of help with bathing/dressing/bathroom;Two people to help with bathing/dressing/bathroom;Assistance with cooking/housework;Direct supervision/assist for medications management;Direct supervision/assist for financial management;Assist for transportation;Help with stairs or ramp for entrance    Equipment Recommendations Rolling walker (2 wheels) (vs no DME needs (well equipped) pending progress)  Recommendations for Other Services  Rehab consult    Functional Status Assessment Patient has had a recent decline in their functional status and demonstrates the ability to make significant improvements in function in a reasonable and predictable amount of time.     Precautions / Restrictions Precautions Precautions: Fall Precaution Comments: R groin wound vac Restrictions Weight Bearing Restrictions: No      Mobility  Bed Mobility Overal bed mobility: Needs Assistance Bed Mobility: Supine to Sit     Supine to sit: Max assist, +2 for physical assistance, +2 for safety/equipment, HOB elevated     General bed mobility comments: Pt coming to long sitting in bed. Pt hesitant and reporting pain with attempts to move R leg off EOB, needing extra time, encouragement, and assistance to complete along with maxAx2 to ascend trunk and use bed pad to scoot hips to EOB.    Transfers Overall transfer level: Needs assistance Equipment used: 2 person hand held assist Transfers: Sit to/from Stand, Bed to chair/wheelchair/BSC Sit to Stand: Max assist, +2 physical assistance, +2 safety/equipment Stand pivot transfers: Max assist, +2 physical assistance, +2 safety/equipment         General transfer comment: Pt holding onto therapist anterior to her with second therapist posterior to pt to guide buttocks to L towards  chair from EOB. MaxAx2 to power up to stand  and pivot.    Ambulation/Gait               General Gait Details: deferred  Stairs            Wheelchair Mobility    Modified Rankin (Stroke Patients Only)       Balance Overall balance assessment: Needs assistance Sitting-balance support: No upper extremity supported, Feet supported Sitting balance-Leahy Scale: Fair Sitting balance - Comments: Static sitting EOB with min guard-minA for safety.   Standing balance support: Bilateral upper extremity supported, During functional activity Standing balance-Leahy Scale: Zero Standing balance comment: MaxAx2 and bil UE support to stand                             Pertinent Vitals/Pain Pain Assessment Pain Assessment: Faces Faces Pain Scale: Hurts even more Pain Location: R lower extremity Pain Descriptors / Indicators: Discomfort, Grimacing, Operative site guarding Pain Intervention(s): Limited activity within patient's tolerance, Monitored during session, Repositioned    Home Living Family/patient expects to be discharged to:: Private residence Living Arrangements: Children (daughter) Available Help at Discharge: Family;Available 24 hours/day (daughter works from home) Type of Home: House Home Access: Ramped entrance       Home Layout: Two level;Able to live on main level with bedroom/bathroom (does not go upstairs per pt) Home Equipment: Grab bars - tub/shower;BSC/3in1;Grab bars - toilet;Rollator (4 wheels);Wheelchair - manual;Hand held shower head;Shower seat - built in;Transport chair      Prior Function Prior Level of Function : Needs assist;History of Falls (last six months)             Mobility Comments: uses rollator, per chart pt has hx of falls reccently. on 3-4 L at home but condenser does not reach everywhere pt needs to go in home ADLs Comments: pt needs assist for LB dressing RLE, supervision for shower.     Hand Dominance        Extremity/Trunk Assessment   Upper  Extremity Assessment Upper Extremity Assessment: Defer to OT evaluation    Lower Extremity Assessment Lower Extremity Assessment: Generalized weakness;RLE deficits/detail RLE Deficits / Details: erythema noted in foot with wound noted at distal great toe; denies any difference in sensation between feet or any numbness to touch    Cervical / Trunk Assessment Cervical / Trunk Assessment: Kyphotic  Communication   Communication: No difficulties  Cognition Arousal/Alertness: Lethargic, Awake/alert (sleeping but easily woken upon arrival) Behavior During Therapy: Flat affect Overall Cognitive Status: No family/caregiver present to determine baseline cognitive functioning                                 General Comments: Pt sleeping upon arrival, but woke up easily. However, pt appeared to remain a bit lethargic yet stayed awake during session, taking increased time to process questions and respond. Needs repeated simple multi-modal cues to sequence all mobility.        General Comments General comments (skin integrity, edema, etc.): VSS on 5L O2    Exercises     Assessment/Plan    PT Assessment Patient needs continued PT services  PT Problem List Decreased strength;Decreased balance;Decreased activity tolerance;Decreased mobility;Cardiopulmonary status limiting activity;Decreased skin integrity;Pain       PT Treatment Interventions DME instruction;Gait training;Functional mobility training;Therapeutic activities;Therapeutic exercise;Balance training;Neuromuscular re-education;Patient/family education;Cognitive remediation    PT Goals (  Current goals can be found in the Care Plan section)  Acute Rehab PT Goals Patient Stated Goal: to not hurt PT Goal Formulation: With patient Time For Goal Achievement: 12/13/21 Potential to Achieve Goals: Good    Frequency Min 3X/week     Co-evaluation PT/OT/SLP Co-Evaluation/Treatment: Yes Reason for Co-Treatment: For  patient/therapist safety;To address functional/ADL transfers PT goals addressed during session: Mobility/safety with mobility;Balance         AM-PAC PT "6 Clicks" Mobility  Outcome Measure Help needed turning from your back to your side while in a flat bed without using bedrails?: A Lot Help needed moving from lying on your back to sitting on the side of a flat bed without using bedrails?: Total Help needed moving to and from a bed to a chair (including a wheelchair)?: Total Help needed standing up from a chair using your arms (e.g., wheelchair or bedside chair)?: Total Help needed to walk in hospital room?: Total Help needed climbing 3-5 steps with a railing? : Total 6 Click Score: 7    End of Session Equipment Utilized During Treatment: Oxygen Activity Tolerance: Patient limited by pain Patient left: in chair;with call bell/phone within reach;with chair alarm set Nurse Communication: Mobility status PT Visit Diagnosis: Unsteadiness on feet (R26.81);Muscle weakness (generalized) (M62.81);History of falling (Z91.81);Difficulty in walking, not elsewhere classified (R26.2);Pain Pain - Right/Left: Right Pain - part of body: Ankle and joints of foot    Time: 9629-5284 PT Time Calculation (min) (ACUTE ONLY): 15 min   Charges:   PT Evaluation $PT Eval Moderate Complexity: 1 Mod          Raymond Gurney, PT, DPT Acute Rehabilitation Services  Pager: [phone] Office: [phone]   Jewel Baize 11/29/2021, 4:24 PM

## 2021-11-29 NOTE — Progress Notes (Signed)
PT Cancellation Note ? ?Patient Details ?Name: Tina Gomez ?MRN: [id number] ?DOB: February 15, 1956 ? ? ?Cancelled Treatment:    Reason Eval/Treat Not Completed: Patient at procedure or test/unavailable. Pt receiving HD treatment currently. Will plan to follow-up later as time permits. ? ? ?Moishe Spice, PT, DPT ?Acute Rehabilitation Services  ?Pager: [phone] ?Office: [phone] ? ? ? ?Maretta Bees Pettis ?11/29/2021, 9:01 AM ? ? ?

## 2021-11-29 NOTE — Progress Notes (Signed)
eLink Physician-Brief Progress Note ?Patient Name: ALETHIA MELENDREZ ?DOB: Jan 19, 1956 ?MRN: [id number] ? ? ?Date of Service ? 11/29/2021  ?HPI/Events of Note ? Pain - Episode of emesis after taking PO Dilaudid. Still c/o pain.   ?eICU Interventions ? Plan: ?D/C Diluadid PO. ?Dilaudid 1 mg IV Q 2 hours PRN moderate or severe pain.  ? ? ? ?Intervention Category ?Major Interventions: Other: ? ?Rickesha Veracruz Cornelia Copa ?11/29/2021, 3:08 AM ?

## 2021-11-29 NOTE — Evaluation (Signed)
Occupational Therapy Evaluation ?Patient Details ?Name: Tina Gomez ?MRN: [id number] ?DOB: 1956/03/08 ?Today's Date: 11/29/2021 ? ? ?History of Present Illness 66 y.o. female who presented 11/21/21 for evaluation of lower limb ischemia. S/p CT aortogram and angiogram of lower extremities 3/17. S/p R lower extremity angiogram, endarterectomy, angioplasty, and common femoral to tibioperoneal trunk bypass 3/22 with post-op hypotension. 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  ? ?Clinical Impression ?  ?PTA, pt was living with her daughter and required assistance for LB ADLs and using rollator for mobility. Pt currently requiring Min A for UB ADLs, Max A for ADLs, and Max A +2 for functional transfers.  Pt presenting with decreased arousal, balance, strength, and activity tolerance with pain. Despite fatigue and pain, pt participating in OOB activity. Pt would benefit from further acute OT to facilitate safe dc. Recommend dc to AIR for further OT to optimize safety, independence with ADLs, and return to PLOF.  ?   ? ?Recommendations for follow up therapy are one component of a multi-disciplinary discharge planning process, led by the attending physician.  Recommendations may be updated based on patient status, additional functional criteria and insurance authorization.  ? ?Follow Up Recommendations ? Acute inpatient rehab (3hours/day)  ?  ?Assistance Recommended at Discharge Frequent or constant Supervision/Assistance  ?Patient can return home with the following Two people to help with walking and/or transfers;A lot of help with bathing/dressing/bathroom ? ?  ?Functional Status Assessment ? Patient has had a recent decline in their functional status and demonstrates the ability to make significant improvements in function in a reasonable and predictable amount of time.  ?Equipment Recommendations ? Other (comment) (Defer to next venue)  ?   ?Recommendations for Other Services   ? ? ?  ?Precautions / Restrictions Precautions ?Precautions: Fall ?Precaution Comments: R groin wound vac ?Restrictions ?Weight Bearing Restrictions: No  ? ?  ? ?Mobility Bed Mobility ?Overal bed mobility: Needs Assistance ?Bed Mobility: Supine to Sit ?  ?  ?Supine to sit: Max assist, +2 for physical assistance, +2 for safety/equipment, HOB elevated ?  ?  ?General bed mobility comments: Pt coming to long sitting in bed. Pt hesitant and reporting pain with attempts to move R leg off EOB, needing extra time, encouragement, and assistance to complete along with maxAx2 to ascend trunk and use bed pad to scoot hips to EOB. ?  ? ?Transfers ?Overall transfer level: Needs assistance ?Equipment used: 2 person hand held assist ?Transfers: Sit to/from Stand, Bed to chair/wheelchair/BSC ?Sit to Stand: Max assist, +2 physical assistance, +2 safety/equipment ?Stand pivot transfers: Max assist, +2 physical assistance, +2 safety/equipment ?  ?  ?  ?  ?General transfer comment: Pt holding onto therapist anterior to her with second therapist posterior to pt to guide buttocks to L towards chair from EOB. MaxAx2 to power up to stand and pivot. ?  ? ?  ?Balance Overall balance assessment: Needs assistance ?Sitting-balance support: No upper extremity supported, Feet supported ?Sitting balance-Leahy Scale: Fair ?Sitting balance - Comments: Static sitting EOB with min guard-minA for safety. ?  ?Standing balance support: Bilateral upper extremity supported, During functional activity ?Standing balance-Leahy Scale: Zero ?Standing balance comment: MaxAx2 and bil UE support to stand ?  ?  ?  ?  ?  ?  ?  ?  ?  ?  ?  ?   ? ?ADL either performed or assessed with clinical  judgement  ? ?ADL Overall ADL's : Needs assistance/impaired ?Eating/Feeding: Set up;Sitting ?  ?Grooming: Minimal assistance;Sitting ?  ?Upper Body Bathing: Minimal assistance;Sitting ?  ?Lower Body Bathing: Maximal assistance;Sit to/from  stand ?  ?Upper Body Dressing : Minimal assistance;Sitting ?  ?Lower Body Dressing: Maximal assistance;Sit to/from stand ?Lower Body Dressing Details (indicate cue type and reason): don socks ?  ?  ?  ?  ?  ?  ?Functional mobility during ADLs: Maximal assistance;+2 for physical assistance ?General ADL Comments: Pt with decreased balance, strength, and activity tolerance. Pain limiting  ? ? ? ?Vision   ?   ?   ?Perception   ?  ?Praxis   ?  ? ?Pertinent Vitals/Pain Pain Assessment ?Pain Assessment: Faces ?Faces Pain Scale: Hurts even more ?Pain Location: R lower extremity ?Pain Descriptors / Indicators: Discomfort, Grimacing, Operative site guarding ?Pain Intervention(s): Monitored during session, Limited activity within patient's tolerance, Repositioned  ? ? ? ?Hand Dominance Right ?  ?Extremity/Trunk Assessment Upper Extremity Assessment ?Upper Extremity Assessment: Overall WFL for tasks assessed ?  ?Lower Extremity Assessment ?Lower Extremity Assessment: Defer to PT evaluation ?RLE Deficits / Details: erythema noted in foot with wound noted at distal great toe; denies any difference in sensation between feet or any numbness to touch ?  ?Cervical / Trunk Assessment ?Cervical / Trunk Assessment: Kyphotic ?  ?Communication Communication ?Communication: No difficulties ?  ?Cognition Arousal/Alertness: Lethargic, Awake/alert (sleeping but easily woken upon arrival) ?Behavior During Therapy: Flat affect ?Overall Cognitive Status: No family/caregiver present to determine baseline cognitive functioning ?  ?  ?  ?  ?  ?  ?  ?  ?  ?  ?  ?  ?  ?  ?  ?  ?General Comments: Pt sleeping upon arrival, but woke up easily. However, pt appeared to remain a bit lethargic yet stayed awake during session, taking increased time to process questions and respond. Needs repeated simple multi-modal cues to sequence all mobility. ?  ?  ?General Comments  VSS on 5L O2 ? ?  ?Exercises   ?  ?Shoulder Instructions    ? ? ?Home Living  Family/patient expects to be discharged to:: Private residence ?Living Arrangements: Children (daughter) ?Available Help at Discharge: Family;Available 24 hours/day (daughter works from home) ?Type of Home: House ?Home Access: Ramped entrance ?  ?  ?Home Layout: Two level;Able to live on main level with bedroom/bathroom (does not go upstairs per pt) ?  ?  ?Bathroom Shower/Tub: Walk-in shower ?  ?Bathroom Toilet: Handicapped height ?  ?  ?Home Equipment: Grab bars - tub/shower;BSC/3in1;Grab bars - toilet;Rollator (4 wheels);Wheelchair - manual;Hand held shower head;Shower seat - built in;Transport chair ?  ?  ?  ? ?  ?Prior Functioning/Environment Prior Level of Function : Needs assist;History of Falls (last six months) ?  ?  ?  ?  ?  ?  ?Mobility Comments: uses rollator, per chart pt has hx of falls reccently. on 3-4 L at home but condenser does not reach everywhere pt needs to go in home ?ADLs Comments: pt needs assist for LB dressing RLE, supervision for shower. ?  ? ?  ?  ?OT Problem List: Decreased strength;Decreased range of motion;Decreased activity tolerance;Impaired balance (sitting and/or standing);Decreased knowledge of use of DME or AE;Decreased knowledge of precautions ?  ?   ?OT Treatment/Interventions: Self-care/ADL training;Therapeutic exercise;Energy conservation;DME and/or AE instruction;Therapeutic activities;Patient/family education  ?  ?OT Goals(Current goals can be found in the care plan section) Acute Rehab OT Goals ?Patient Stated  Goal: Stop pain ?OT Goal Formulation: With patient ?Time For Goal Achievement: 12/13/21 ?Potential to Achieve Goals: Good  ?OT Frequency: Min 2X/week ?  ? ?Co-evaluation PT/OT/SLP Co-Evaluation/Treatment: Yes ?Reason for Co-Treatment: To address functional/ADL transfers;For patient/therapist safety ?PT goals addressed during session: Mobility/safety with mobility;Balance ?OT goals addressed during session: ADL's and self-care ?  ? ?  ?AM-PAC OT "6 Clicks" Daily  Activity     ?Outcome Measure Help from another person eating meals?: A Little ?Help from another person taking care of personal grooming?: A Little ?Help from another person toileting, which includes using toliet, bedpan,

## 2021-11-29 NOTE — Progress Notes (Addendum)
?Progress Note ? ? ? ?11/29/2021 ?8:02 AM ?1 Day Post-Op ? ?Subjective:  says she still has right foot pain ? ?Afebrile ? ? ?Vitals:  ? 11/29/21 0600 11/29/21 0757  ?BP: (!) 161/49   ?Pulse: 62 63  ?Resp: 11 17  ?Temp:    ?SpO2: 100% 100%  ? ? ?Physical Exam: ?Cardiac:  regular ?Lungs:  non labored ?Incisions:  right groin with Prevena in tact with good seal; right BK incision looks fine ?Extremities:  brisk right AT doppler signal; right calf is very soft and minimal tenderness; motor in tact right foot ? ? ?CBC ?   ?Component Value Date/Time  ? WBC 14.8 (H) 11/29/2021 0439  ? RBC 3.43 (L) 11/29/2021 0439  ? HGB 9.8 (L) 11/29/2021 0439  ? HGB 9.5 (L) 02/06/2021 1884  ? HCT 32.0 (L) 11/29/2021 0439  ? HCT 31.3 (L) 02/06/2021 1660  ? PLT 192 11/29/2021 0439  ? PLT 299 02/06/2021 0927  ? MCV 93.3 11/29/2021 0439  ? MCV 93 02/06/2021 0927  ? MCH 28.6 11/29/2021 0439  ? MCHC 30.6 11/29/2021 0439  ? RDW 16.5 (H) 11/29/2021 0439  ? RDW 15.8 (H) 02/06/2021 6301  ? LYMPHSABS 1.5 11/21/2021 1842  ? LYMPHSABS 1.6 02/06/2021 0927  ? MONOABS 0.6 11/21/2021 1842  ? EOSABS 0.1 11/21/2021 1842  ? EOSABS 0.2 02/06/2021 0927  ? BASOSABS 0.0 11/21/2021 1842  ? BASOSABS 0.0 02/06/2021 6010  ? ? ?BMET ?   ?Component Value Date/Time  ? NA 137 11/29/2021 0439  ? NA 141 11/24/2020 1304  ? K 4.8 11/29/2021 0439  ? CL 103 11/29/2021 0439  ? CO2 23 11/29/2021 0439  ? GLUCOSE 129 (H) 11/29/2021 0439  ? BUN 56 (H) 11/29/2021 0439  ? BUN 47 (H) 11/24/2020 1304  ? CREATININE 7.51 (H) 11/29/2021 0439  ? CREATININE 1.25 (H) 11/13/2016 1018  ? CALCIUM 8.3 (L) 11/29/2021 0439  ? CALCIUM 5.8 (LL) 07/24/2020 0343  ? GFRNONAA 6 (L) 11/29/2021 0439  ? GFRNONAA 47 (L) 11/13/2016 1018  ? GFRAA 13 (L) 06/15/2020 1134  ? GFRAA 54 (L) 11/13/2016 1018  ? ? ?INR ?   ?Component Value Date/Time  ? INR 1.2 11/21/2021 2010  ? ? ? ?Intake/Output Summary (Last 24 hours) at 11/29/2021 0802 ?Last data filed at 11/29/2021 0600 ?Gross per 24 hour  ?Intake 2594.78 ml   ?Output 740 ml  ?Net 1854.78 ml  ? ? ? ?Assessment/Plan:  66 y.o. female is s/p:  ?1) Right external iliac, common femoral and extensive right profunda femoris endarterectomy (second order branches of profunda) and bovine patch angioplasty ?2) Right popliteal and tibioperoneal trunk endarterectomy and bovine patch angioplasty ?3) Right common femoral to tibioperoneal trunk bypass with 20mm ePTFE ?4) Right lower extremity angiogram  ?1 Day Post-Op ? ? ?-pt with brisk right AT doppler signal ?-incisions look fine; prevena in place right groin ?-right calf is very soft with no evidence of compartment syndrome ?-hemodynamically stable off Levophed ?-will start mobilizing pt today.  PT/OT ordered.  ?-DVT prophylaxis:  heparin gtt ? ? ?Tina Locket, PA-C ?[facility] ?[phone] ?11/29/2021 ?8:02 AM ? ? ?VASCULAR STAFF ADDENDUM: ?I have independently interviewed and examined the patient. ?I agree with the above.  ?Good technical result from bypass yesterday. Foot is warm and pink. AT doppler signal biphasic. Reports foot pain continues, but is less than previous. Mobilizing with some difficulty with PT. Will transition to therapeutic dose heparin and ultimately to Eliquis to promote bypass patency. Looks ready  for progressive.  ? ?Tina Gomez. Stanford Breed, MD ?[facility] ?Office Phone Number: [phone] ?11/29/2021 4:58 PM ? ? ?

## 2021-11-29 NOTE — Progress Notes (Signed)
? ?                                                                                                                                                     ?                                                   ?Daily Progress Note  ? ?Patient Name: Tina Gomez       Date: 11/29/2021 ?DOB: 1956-05-27  Age: 66 y.o. MRN#: [id number] ?Attending Physician: Tina Nash, DO ?Primary Care Physician: Tina Rakes, MD ?Admit Date: 11/21/2021 ? ? ? ?HPI/Patient Profile: 66 y.o. 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 She presented to the ED on on 11/21/2021 with pain in both feet for 2 weeks that had worsened over the past 2 days. Patient also reported cold sensation and discoloration to both feet. ABIs showed moderate RLE arterial disease and critical left limb ischemia. Admitted to [facility].  ? ?Subjective: ?Chart reviewed. Patient is s/p right endarterectomy and femoral popliteal bypass. She was hypotensive post-op requiring treatment with a vasopressor (levophed) so was transferred to ICU.  ? ?I went to see patient at bedside. Levophed is off. She is lethargic. She reports right foot pain.  ? ?I spoke with daughter Tina Gomez by phone. She tells me yesterday was "rough" and that surgery took much longer than anticipated. Tina Gomez has been informed by vascular surgery that he mother will not be a candidate for additional surgical intervention.  ?Ongoing discussion was had regarding the seriousness of Tina Gomez's current medical condition in the setting of her multiple co-morbidities. We discussed that she is high risk to decompensate secondary to his multiple comorbidities.Tina Gomez fully understands that her mother is fragile with ultimately a poor prognosis. They have discussed comfort care and hospice when things "get really bad". Tina Gomez shares that her  mother has been ready for this several times, but then changes her mind when she looks at her grandchildren.  ? ?We also discussed disposition. Prior to surgery, [place] had expressed wanting to go home. However Tina Gomez shares that she cannot take care of her mother at home in her current condition. I told Tina Gomez that I am off until Monday 3/27 and will check in with her when I return. Tina Gomez has PMT contact info, and knows to call if there are urgent needs over the weekend.   ?  ? ?Objective: ? ?Physical Exam ?Vitals reviewed.  ?Constitutional:   ?   General: She is not in acute  distress. ?   Appearance: She is ill-appearing.  ?Pulmonary:  ?   Effort: Pulmonary effort is normal.  ?Neurological:  ?   Mental Status: She is lethargic.  ?   Motor: Weakness present.  ?         ? ?Vital Signs: BP (!) 112/50 (BP Location: Right Arm)   Pulse (!) 54   Temp (!) 96.8 ?F (36 ?C) (Axillary)   Resp 12   Ht 5\' 2"  (1.575 m)   Wt 85.1 kg   LMP  (LMP Unknown)   SpO2 100%   BMI 34.31 kg/m?  ?SpO2: SpO2: 100 % ?O2 Device: O2 Device: Nasal Cannula ?O2 Flow Rate: O2 Flow Rate (L/min): 4 L/min ? ? ?LBM: Last BM Date : 11/27/21 ?Baseline Weight: Weight: 77.1 kg ?Most recent weight: Weight: 85.1 kg ? ?     ?Palliative Assessment/Data: PPS 30% ? ? ? ? ? ?Palliative Care Assessment & Plan  ? ?Assessment: ?- critical limb ischemia of both lower extremities ?- ESRD on HD ?- chronic systolic CHF ?- chronic respiratory failure with hypoxia ?- COPD ?  ?Recommendations/Plan: ?DNR/DNI as previously documented ?Patient and daughter are very realistic and understands her overall prognosis is poor ?Agree with fentanyl 50-100 mcg every 2 hours as needed for pain (fentanyl is preferred over dilaudid for patients with ESRD) ?Daughter is unable to continue caring for patient at home ?I will follow up with patient and daughter when I return Monday 3/27 ?Please call [phone] if there are urgent needs over the weekend ? ?  ?  ?Scope of Treatment (per  current MOST form): ?  ?Cardiopulmonary Resuscitation: Do Not Attempt Resuscitation (DNR/No CPR)  ?Medical Interventions: Limited Additional Interventions: Use medical treatment, IV fluids and cardiac monitoring as indicated, DO NOT USE intubation or mechanical ventilation. May consider use of less invasive airway support such as BiPAP or CPAP. Also provide comfort measures. Transfer to the hospital if indicated. Avoid intensive care.   ?Antibiotics: Antibiotics if indicated  ?IV Fluids: IV fluids for a defined trial period  ?Feeding Tube: No feeding tube  ?  ?  ?Prognosis: ? Guarded, she is high risk to decompensate ? ?Discharge Planning: ?To Be Determined ? ? ? ?Thank you for allowing the Palliative Medicine Team to assist in the care of this patient. ? ?MDM - high ? ? ?Tina Bullion, NP ? ?Please contact Palliative Medicine Team phone at [phone] for questions and concerns.  ? ? ? ? ? ?

## 2021-11-29 NOTE — Progress Notes (Signed)
OT Cancellation Note ? ?Patient Details ?Name: Tina Gomez ?MRN: [id number] ?DOB: 28-Oct-1955 ? ? ?Cancelled Treatment:    Reason Eval/Treat Not Completed: Patient at procedure or test/ unavailable (Dialysis. Will return as schedule allows.) ? ?Yotam Rhine M Kentaro Alewine ?Shondell Poulson MSOT, OTR/L ?Acute Rehab ?Pager: [phone] ?Office: [phone] ?11/29/2021, 9:00 AM ?

## 2021-11-29 NOTE — Progress Notes (Signed)
eLink Physician-Brief Progress Note ?Patient Name: Tina Gomez ?DOB: Jul 15, 1956 ?MRN: [id number] ? ? ?Date of Service ? 11/29/2021  ?HPI/Events of Note ? N/V - Not due to get Zofran until after 6 AM.  ?eICU Interventions ? Plan: ?Compazine 10 mg IV X 1 now.   ? ? ? ?Intervention Category ?Major Interventions: Other: ? ?[name] ?11/29/2021, 3:57 AM ?

## 2021-11-30 DIAGNOSIS — Z992 Dependence on renal dialysis: Secondary | ICD-10-CM | POA: Diagnosis not present

## 2021-11-30 DIAGNOSIS — N186 End stage renal disease: Secondary | ICD-10-CM | POA: Diagnosis not present

## 2021-11-30 DIAGNOSIS — I70223 Atherosclerosis of native arteries of extremities with rest pain, bilateral legs: Secondary | ICD-10-CM | POA: Diagnosis not present

## 2021-11-30 DIAGNOSIS — J9611 Chronic respiratory failure with hypoxia: Secondary | ICD-10-CM | POA: Diagnosis not present

## 2021-11-30 LAB — HEPARIN LEVEL (UNFRACTIONATED): Heparin Unfractionated: <0.1 IU/mL — ABNORMAL LOW (ref 0.30–0.70)

## 2021-11-30 LAB — RENAL FUNCTION PANEL
Albumin: 2.6 g/dL — ABNORMAL LOW (ref 3.5–5.0)
Anion gap: 11 (ref 5–15)
BUN: 36 mg/dL — ABNORMAL HIGH (ref 8–23)
CO2: 25 mmol/L (ref 22–32)
Calcium: 8.3 mg/dL — ABNORMAL LOW (ref 8.9–10.3)
Chloride: 99 mmol/L (ref 98–111)
Creatinine, Ser: 5.8 mg/dL — ABNORMAL HIGH (ref 0.44–1.00)
GFR, Estimated: 8 mL/min — ABNORMAL LOW (ref >60)
Glucose, Bld: 116 mg/dL — ABNORMAL HIGH (ref 70–99)
Phosphorus: 7.6 mg/dL — ABNORMAL HIGH (ref 2.5–4.6)
Potassium: 4.5 mmol/L (ref 3.5–5.1)
Sodium: 135 mmol/L (ref 135–145)

## 2021-11-30 LAB — CBC
HCT: 28.2 % — ABNORMAL LOW (ref 36.0–46.0)
HCT: 26.1 % — ABNORMAL LOW (ref 36.0–46.0)
Hemoglobin: 8.4 g/dL — ABNORMAL LOW (ref 12.0–15.0)
Hemoglobin: 8 g/dL — ABNORMAL LOW (ref 12.0–15.0)
MCH: 27.9 pg (ref 26.0–34.0)
MCH: 29 pg (ref 26.0–34.0)
MCHC: 29.8 g/dL — ABNORMAL LOW (ref 30.0–36.0)
MCHC: 30.7 g/dL (ref 30.0–36.0)
MCV: 93.7 fL (ref 80.0–100.0)
MCV: 94.6 fL (ref 80.0–100.0)
Platelets: 177 10*3/uL (ref 150–400)
Platelets: 183 10*3/uL (ref 150–400)
RBC: 3.01 MIL/uL — ABNORMAL LOW (ref 3.87–5.11)
RBC: 2.76 MIL/uL — ABNORMAL LOW (ref 3.87–5.11)
RDW: 16.8 % — ABNORMAL HIGH (ref 11.5–15.5)
RDW: 17.2 % — ABNORMAL HIGH (ref 11.5–15.5)
WBC: 13 10*3/uL — ABNORMAL HIGH (ref 4.0–10.5)
WBC: 14.4 10*3/uL — ABNORMAL HIGH (ref 4.0–10.5)
nRBC: 0 % (ref 0.0–0.2)
nRBC: 0 % (ref 0.0–0.2)

## 2021-11-30 LAB — APTT: aPTT: 52 seconds — ABNORMAL HIGH (ref 24–36)

## 2021-11-30 LAB — GLUCOSE, CAPILLARY
Glucose-Capillary: 114 mg/dL — ABNORMAL HIGH (ref 70–99)
Glucose-Capillary: 129 mg/dL — ABNORMAL HIGH (ref 70–99)
Glucose-Capillary: 121 mg/dL — ABNORMAL HIGH (ref 70–99)

## 2021-11-30 LAB — HEPATITIS B SURFACE ANTIBODY, QUANTITATIVE: Hep B S AB Quant (Post): 951.3 m[IU]/mL (ref >9.9)

## 2021-11-30 MED ORDER — ALBUMIN HUMAN 25 % IV SOLN
12.5000 g | Freq: Once | INTRAVENOUS | Status: AC
  Administered 2021-11-30: 12.5 g via INTRAVENOUS
  Filled 2021-11-30: qty 50

## 2021-11-30 MED ORDER — ASPIRIN 81 MG PO CHEW
81.0000 mg | CHEWABLE_TABLET | Freq: Every day | ORAL | Status: DC
  Filled 2021-11-30: qty 1

## 2021-11-30 MED ORDER — SODIUM CHLORIDE 0.9 % IV SOLN: 100.0000 mL | INTRAVENOUS | Status: DC | PRN

## 2021-11-30 MED ORDER — APIXABAN 5 MG PO TABS
5.0000 mg | ORAL_TABLET | Freq: Two times a day (BID) | ORAL | Status: DC
  Administered 2021-11-30 (×2): 5 mg via ORAL
  Filled 2021-11-30 (×4): qty 1

## 2021-11-30 MED ORDER — ALBUMIN HUMAN 25 % IV SOLN
25.0000 g | Freq: Once | INTRAVENOUS | Status: AC
  Administered 2021-11-30: 25 g via INTRAVENOUS
  Filled 2021-11-30: qty 100

## 2021-11-30 MED ORDER — NEPRO/CARBSTEADY PO LIQD
237.0000 mL | Freq: Three times a day (TID) | ORAL | Status: DC
  Administered 2021-11-30: 237 mL via ORAL

## 2021-11-30 NOTE — Progress Notes (Addendum)
? ?NAME:  Tina Gomez, MRN:  [id number], DOB:  1956/06/11, LOS: 9 ?ADMISSION DATE:  11/21/2021, CONSULTATION DATE:  11/28/21 ?REFERRING MD:  Stanford Breed, CHIEF COMPLAINT:  critical limb ischemia  ? ?History of Present Illness:  ?Tina Gomez is a 66 year old chronically ill female with severe vascular disease who was admitted on 11/21/2021 for bilateral lower extremity pain, found to have critical limb ischemia of the right lower extremity.  She underwent extensive endarterectomy of the right external iliac, common femoral, right profunda femoris, right popliteal and tibial peroneal arteries as well as a femoral to tibial peroneal trunk bypass.  She required some low-dose Levophed following the procedure and critical care was asked to consult for transfer to the ICU. ? ?Pertinent  Medical History  ?PAD, CAD, HFrEF (EF 30%), history of stroke, hyperlipidemia, hypertension ?End-stage renal disease on dialysis ?COPD with chronic respiratory failure (3 to 4 L at baseline), tobacco use ?Type 2 diabetes ?Depression/anxiety ? ?Significant Hospital Events: ?Including procedures, antibiotic start and stop dates in addition to other pertinent events   ?3/15: Admission to [facility] ?3/22:endarterectomy of the right external iliac, common femoral, right profunda femoris, right popliteal and tibial peroneal arteries as well as a femoral to tibial peroneal trunk bypass.  PCCM consulted for postoperative hypotension ? ?Interim History / Subjective:  ?Norepinephrine off ?Nasal cannula 2 L/min ?I/O+ 2.1 L total ?Heparin infusion ? ? ?Objective   ?Blood pressure (!) 145/57, pulse 63, temperature 97.6 ?F (36.4 ?C), temperature source Oral, resp. rate 16, height 5\' 2"  (1.575 m), weight 81.9 kg, SpO2 100 %. ?   ?   ? ?Intake/Output Summary (Last 24 hours) at 11/30/2021 0814 ?Last data filed at 11/30/2021 0700 ?Gross per 24 hour  ?Intake 332.69 ml  ?Output 0 ml  ?Net 332.69 ml  ? ?Filed Weights  ? 11/29/21 0500 11/29/21 0712 11/30/21 0500  ?Weight:  85.1 kg 85.1 kg 81.9 kg  ? ? ?Examination: ?General: Chronically ill-appearing woman, weak ?Cardiac: Distant, regular, no murmur ?Pulm: Distant, decreased to both bases, no wheezes ?Abdomen: Nondistended, hypoactive bowel sounds, nontender ?Neuro: Wakes easily but sleepy.  She does interact, follow commands.  Globally weak but able to lift legs off the bed ?Skin: Right lower extremity warm and well-perfused ? ? ?Resolved Hospital Problem list   ? ? ?Assessment & Plan:  ? ?Postoperative hypotension ?Critical limb ischemia status post extensive endarterectomy with femoral-tibial peroneal bypass ?Acute blood loss anemia ?-Norepinephrine weaned to off ?-Heparin infusion per vascular surgery plans, 2-3 days ? ?COPD with chronic respiratory failure, tobacco use disorder ?-Wean FiO2, currently 2 L/min.  She is on 3-4 L/min at home ?-Continue Brovana, Pulmicort, Yupelri ?-Albuterol as needed ?-Home regimen is Spiriva only ?-Push pulmonary hygiene ? ?Type 2 diabetes mellitus ?-Sliding scale insulin, following CBG ? ?History of hypertension ?Non-obstructive CAD ([facility] 11/2020) ?HFrEF (EF 30-35%) ?-Back on losartan, carvedilol.  Careful adjusting in the setting of her HD ? ?ESRD on HD ?-Appreciate nephrology management, HD are as per their plans ? ?Disposition: ?Patient should be able to move out of the ICU 3/24, will ask [facility] to assume her care as of 3/25 ? ?Best Practice (right click and "Reselect all SmartList Selections" daily)  ? ?Diet/type: regular  ?DVT prophylaxis: heparin gtt ?Lines: Right EJ, left foot, right anticubital ?Code Status:  DNR ?Last date of multidisciplinary goals of care discussion [per primary] ? ?Labs   ?CBC: ?Recent Labs  ?Lab 11/28/21 ?0230 11/28/21 ?1401 11/28/21 ?1510 11/29/21 ?0439 11/29/21 ?1613 11/30/21 ?9379  ?WBC  6.7  --  13.6* 14.8* 11.8* 13.0*  ?HGB 9.2* 8.2* 7.0* 9.8* 9.9* 8.4*  ?HCT 31.5* 24.0* 24.1* 32.0* 31.8* 28.2*  ?MCV 91.8  --  93.4 93.3 92.2 93.7  ?PLT 207  --  212 192 183 177   ? ? ?Basic Metabolic Panel: ?Recent Labs  ?Lab 11/24/21 ?0318 11/26/21 ?0155 11/27/21 ?0303 11/28/21 ?0230 11/28/21 ?1401 11/29/21 ?7371 11/29/21 ?1613  ?NA 134* 135 136 137 136 137 137  ?K 4.8 4.4 4.0 4.2 4.8 4.8 3.9  ?CL 94* 99 98 98  --  103 101  ?CO2 24 25 27 27   --  23 29  ?GLUCOSE 87 137* 134* 125*  --  129* 111*  ?BUN 65* 52* 35* 48*  --  56* 23  ?CREATININE 8.09* 7.19* 5.26* 6.66*  --  7.51* 4.14*  ?CALCIUM 9.0 8.5* 8.7* 8.9  --  8.3* 8.2*  ?PHOS 9.4* 6.9* 5.8* 6.2*  --   --  4.6  ? ?GFR: ?Estimated Creatinine Clearance: 13.6 mL/min (A) (by C-G formula based on SCr of 4.14 mg/dL (H)). ?Recent Labs  ?Lab 11/28/21 ?1510 11/29/21 ?0439 11/29/21 ?1613 11/30/21 ?0626  ?WBC 13.6* 14.8* 11.8* 13.0*  ? ? ?Liver Function Tests: ?Recent Labs  ?Lab 11/24/21 ?0318 11/26/21 ?0155 11/27/21 ?0303 11/28/21 ?0230 11/29/21 ?1613  ?ALBUMIN 2.8* 2.7* 2.8* 2.7* 3.0*  ? ?No results for input(s): LIPASE, AMYLASE in the last 168 hours. ?No results for input(s): AMMONIA in the last 168 hours. ? ?ABG ?   ?Component Value Date/Time  ? PHART 7.307 (L) 11/28/2021 1401  ? PCO2ART 50.1 (H) 11/28/2021 1401  ? PO2ART 96 11/28/2021 1401  ? HCO3 25.1 11/28/2021 1401  ? TCO2 27 11/28/2021 1401  ? ACIDBASEDEF 1.0 11/28/2021 1401  ? O2SAT 97 11/28/2021 1401  ?  ? ?Coagulation Profile: ?No results for input(s): INR, PROTIME in the last 168 hours. ? ? ?Cardiac Enzymes: ?No results for input(s): CKTOTAL, CKMB, CKMBINDEX, TROPONINI in the last 168 hours. ? ?HbA1C: ?Hemoglobin A1C  ?Date/Time Value Ref Range Status  ?11/22/2021 04:01 PM 6.3 (A) 4.0 - 5.6 % Final  ? ?HbA1c, POC (controlled diabetic range)  ?Date/Time Value Ref Range Status  ?08/14/2021 11:40 AM 6.4 0.0 - 7.0 % Final  ?05/23/2020 04:13 PM 7.6 (A) 0.0 - 7.0 % Final  ? ?Hgb A1c MFr Bld  ?Date/Time Value Ref Range Status  ?04/06/2021 03:08 AM 6.2 (H) 4.8 - 5.6 % Final  ?  Comment:  ?  (NOTE) ?Pre diabetes:          5.7%-6.4% ? ?Diabetes:              >6.4% ? ?Glycemic control for    <7.0% ?adults with diabetes ?  ?11/08/2020 04:42 AM 6.4 (H) 4.8 - 5.6 % Final  ?  Comment:  ?  (NOTE) ?Pre diabetes:          5.7%-6.4% ? ?Diabetes:              >6.4% ? ?Glycemic control for   <7.0% ?adults with diabetes ?  ? ? ?CBG: ?Recent Labs  ?Lab 11/29/21 ?0700 11/29/21 ?1126 11/29/21 ?1620 11/29/21 ?2158 11/30/21 ?0603  ?GLUCAP 111* 101* 106* 115* 114*  ? ? ?Baltazar Apo, MD, PhD ?11/30/2021, 8:21 AM ?[facility] ?[phone] or if no answer before 7:00PM call [phone] ?For any issues after 7:00PM please call eLink [phone] ? ? ?

## 2021-11-30 NOTE — Progress Notes (Signed)
?[facility] ?Progress Note  ? ?Subjective: HD yesterday. Seen in room today, moving out of ICU going to the floor. Pt is in pain.  ? ?Objective ?Vitals:  ? 11/30/21 0800 11/30/21 0900 11/30/21 1000 11/30/21 1100  ?BP: (!) 142/53 (!) 139/57 (!) 135/57 139/60  ?Pulse: 60 61 61 62  ?Resp: (!) 1 11 14 10   ?Temp:      ?TempSrc:      ?SpO2: 97% 99% 97% 99%  ?Weight:      ?Height:      ? ?Physical Exam ?General: Chronically ill appearing female in NAD ?Heart: S1,S2 RRR No M/R/G ?Lungs: CTAB  ?Abdomen: ndnt, +bs, no ascites or hsm ?Extremities: No LE edema. R groin wound vac intact.  ?Dialysis Access: L AVF + T/B ? ?Medications: ? sodium chloride    ? sodium chloride    ? sodium chloride 75 mL/hr at 11/28/21 1523  ? sodium chloride    ? sodium chloride    ? sodium chloride    ? sodium chloride    ? sodium chloride    ? heparin 1,150 Units/hr (11/30/21 1100)  ? iron sucrose    ? magnesium sulfate bolus IVPB    ? ? arformoterol  15 mcg Nebulization BID  ? aspirin  325 mg Oral Daily  ? atorvastatin  80 mg Oral q1800  ? budesonide (PULMICORT) nebulizer solution  0.25 mg Nebulization BID  ? carvedilol  12.5 mg Oral QHS  ? And  ? carvedilol  12.5 mg Oral Once per day on Sun Tue Thu  ? Chlorhexidine Gluconate Cloth  6 each Topical Daily  ? darbepoetin (ARANESP) injection - DIALYSIS  25 mcg Intravenous Q Mon-HD  ? docusate sodium  100 mg Oral Daily  ? doxercalciferol  1 mcg Intravenous Q M,W,F-HD  ? FLUoxetine  20 mg Oral QHS  ? gabapentin  200 mg Oral QHS  ? insulin aspart  0-6 Units Subcutaneous TID WC  ? losartan  100 mg Oral Daily  ? multivitamin  1 tablet Oral QHS  ? pantoprazole  40 mg Oral Daily  ? revefenacin  175 mcg Nebulization Daily  ? sodium chloride flush  3 mL Intravenous Q12H  ? sodium chloride flush  3 mL Intravenous Q12H  ? sucroferric oxyhydroxide  500 mg Oral TID WC  ? ? ? ?Dialysis Orders: ?Andree Elk form MWFS 3 hours EDW 75 kg ?3K, 2.5 CA bath UF P2 ?Heparin 5000 units IV Initial bolus 2000 units  IV mid run ?Hectorol 1 mcg IV TIW ?Venofer 50 mg IV weekly ?  ?  ?Assessment/Plan: ?Bilateral lower extremity critical limb extremity ischemia-Followed by [facility]. Needs staged bilateral femoral endarterectomies and FPB. R To OR 11/28/2021 per Dr. Stanford Breed. S/P Right external iliac, common femoral and extensive right profunda femoris endarterectomy, Right popliteal and tibioperoneal trunk endarterectomy and bovine patch angioplasty, Right common femoral to tibioperoneal trunk bypass with 70mm ePTFE, RLE angiogram. Per primary, [facility]VS.  ?ESRD -HD MWFS. HD today to get back on schedule. Doing HD MWF here (not 4x/ week) .  ?Hypertension/volume  -No evidence of volume overload by exam. Up 3kg pre HD today.  ?Anemia  -HGB 9.2 fell to 7.0 intraoperatively. S/P 2 units PRBCS  11/28/2021. HGB 9.8. Decrease ESA dose, give today.  Continue weekly Venofer. Follow HBG.   ?Metabolic bone disease -Continue binders, VDRA.  ?History of HFrEF (last EF 30-35% with G1DD 11/24/2021). Optimize volume with HD. Reminded patient to adhere to fluid restrictions.  ?COPD/tobacco abuse-Meds per primary ?  DM type II -Per primary.  ?Nutrition -ALB low.  diet renal/carb modified renal with fluid restriction renal vitamin protein supplement. ?History of anxiety/depression- Per primary ? ?Kelly Splinter, MD ?11/30/2021, 4:36 PM ? ? ? ? ? ? ?  ? ?

## 2021-11-30 NOTE — Progress Notes (Signed)
Patient was transferred from [facility] via wheelchair. Patient is alert and oriented.  ?

## 2021-11-30 NOTE — Progress Notes (Signed)
ANTICOAGULATION CONSULT NOTE  ? ?Pharmacy Consult for heparin  ?Indication: Critical limb ischemia ? ?Allergies  ?Allergen Reactions  ? Umeclidinium Bromide Anaphylaxis  ? Codeine Nausea And Vomiting  ? Adhesive [Tape] Other (See Comments)  ?  Tape breaks out the skin if it is left on for a lengthy period of time  ? ? ?Patient Measurements: ?Height: 5\' 2"  (157.5 cm) ?Weight: 85.1 kg (187 lb 9.8 oz) ?IBW/kg (Calculated) : 50.1 ?HEPARIN DW (KG): 66.7 ? ? ? ?Vital Signs: ?Temp: 98.3 ?F (36.8 ?C) (03/23 2355) ?Temp Source: Oral (03/23 2256) ?BP: 160/55 (03/24 0000) ?Pulse Rate: 62 (03/24 0000) ? ?Labs: ?Recent Labs  ?  11/28/21 ?0230 11/28/21 ?1401 11/28/21 ?1510 11/29/21 ?0439 11/29/21 ?1613 11/29/21 ?2134  ?HGB 9.2*   < > 7.0* 9.8* 9.9*  --   ?HCT 31.5*   < > 24.1* 32.0* 31.8*  --   ?PLT 207  --  212 192 183  --   ?APTT  --   --   --  37*  --   --   ?HEPARINUNFRC  --   --   --   --   --  <0.10*  ?CREATININE 6.66*  --   --  7.51* 4.14*  --   ? < > = values in this interval not displayed.  ? ? ? ?Estimated Creatinine Clearance: 13.9 mL/min (A) (by C-G formula based on SCr of 4.14 mg/dL (H)). ? ? ?Medical History: ?Past Medical History:  ?Diagnosis Date  ? Anemia   ? CAD (coronary artery disease)   ? Chronic respiratory failure with hypoxia (HCC)   ? COPD (chronic obstructive pulmonary disease) ([place]) 05/2019  ? no inhaler  ? Diabetes mellitus without complication ([place])   ? no meds - diet controlled  ? ESRD on hemodialysis ([place])   ? History of blood transfusion 09/2020  ? 1 unit  ? HLD (hyperlipidemia)   ? Hypertension   ? Peripheral vascular disease ([place])   ? PONV (postoperative nausea and vomiting)   ? Restless legs syndrome (RLS)   ? Stroke [facility])   ? ? ?Medications:  ?Medications Prior to Admission  ?Medication Sig Dispense Refill Last Dose  ? acetaminophen (TYLENOL) 500 MG tablet Take 1,000 mg by mouth every 6 (six) hours as needed for mild pain or headache.   Past Month  ? albuterol (PROVENTIL) (2.5 MG/3ML) 0.083%  nebulizer solution Take 3 mLs (2.5 mg total) by nebulization every 6 (six) hours as needed for wheezing or shortness of breath. (Patient taking differently: Take 2.5 mg by nebulization at bedtime.) 75 mL 0 Past Month  ? albuterol (VENTOLIN HFA) 108 (90 Base) MCG/ACT inhaler Inhale 2 puffs into the lungs every 6 (six) hours as needed for wheezing or shortness of breath. 18 g 2 11/20/2021  ? aspirin 325 MG tablet Take 325 mg by mouth daily.   11/20/2021  ? Aspirin-Caffeine (BAYER BACK & BODY PO) Take 1 tablet by mouth daily as needed (headache/pain).   Past Week  ? atorvastatin (LIPITOR) 40 MG tablet Take 1 tablet (40 mg total) by mouth daily at 6 PM.   11/20/2021  ? B Complex-C-Zn-Folic Acid (DIALYVITE 785 WITH ZINC) 0.8 MG TABS Take 1 tablet by mouth at bedtime.   11/20/2021  ? carvedilol (COREG) 12.5 MG tablet Take 1 tablet (12.5 mg total) by mouth 2 (two) times daily with a meal. (Patient taking differently: Take 12.5 mg by mouth See admin instructions. Take one tablet (12.5 mg) by mouth on Sunday, Tuesday,  Thursday mornings (non-dialysis days); take one tablet (12.5 mg) daily at bedtime)   11/20/2021 at 2100  ? FLUoxetine (PROZAC) 20 MG capsule Take 1 capsule (20 mg total) by mouth daily. (Patient taking differently: Take 20 mg by mouth at bedtime.) 30 capsule 2 11/20/2021  ? gabapentin (NEURONTIN) 100 MG capsule Take 1 capsule (100 mg total) by mouth 2 (two) times daily. (Patient taking differently: Take 200 mg by mouth at bedtime.) 60 capsule 0 11/20/2021  ? hydrOXYzine (ATARAX) 25 MG tablet Take 25 mg by mouth 3 (three) times daily as needed for anxiety.   unk  ? lidocaine-prilocaine (EMLA) cream Apply 1 application topically See admin instructions. Apply topically to port access one hour prior to dialysis - Monday, Wednesday, Friday, Saturday   11/21/2021  ? loperamide (IMODIUM A-D) 2 MG tablet Take 1 tablet (2 mg total) by mouth 4 (four) times daily as needed for diarrhea or loose stools. (Patient taking  differently: Take 2 mg by mouth See admin instructions. Take one tablet (2 mg) by mouth prior to dialysis on Monday, Wednesday, Friday and Saturday) 30 tablet 1 Past Week  ? losartan (COZAAR) 100 MG tablet Take 1 tablet (100 mg total) by mouth every evening. (Patient taking differently: Take 100 mg by mouth See admin instructions. Take one tablet (100 mg) by mouth at noon on Sunday, Tuesday, Thursday (non-dialysis days); take one tablet (100 mg) at bedtime on Monday, Wednesday, Friday, Saturday (dialysis days)) 30 tablet 6 11/20/2021  ? ondansetron (ZOFRAN) 4 MG tablet Take 1 tablet (4 mg total) by mouth every 8 (eight) hours as needed for nausea or vomiting. 60 tablet 3 unk  ? Tiotropium Bromide Monohydrate (SPIRIVA RESPIMAT) 2.5 MCG/ACT AERS Inhale 2 puffs into the lungs daily. 4 g 3 11/20/2021  ? glucose blood (TRUETEST TEST) test strip Use as instructed 100 each 12   ? Lancets (FREESTYLE) lancets Use as instructed 100 each 12   ? OXYGEN Inhale 4 L/min into the lungs continuous.     ? TRUEplus Lancets 28G MISC 1 each by Does not apply route 3 (three) times daily before meals. 100 each 12   ? ?Scheduled:  ? arformoterol  15 mcg Nebulization BID  ? aspirin  325 mg Oral Daily  ? atorvastatin  80 mg Oral q1800  ? budesonide (PULMICORT) nebulizer solution  0.25 mg Nebulization BID  ? carvedilol  12.5 mg Oral QHS  ? And  ? carvedilol  12.5 mg Oral Once per day on Sun Tue Thu  ? Chlorhexidine Gluconate Cloth  6 each Topical Daily  ? darbepoetin (ARANESP) injection - DIALYSIS  25 mcg Intravenous Q Mon-HD  ? docusate sodium  100 mg Oral Daily  ? doxercalciferol  1 mcg Intravenous Q M,W,F-HD  ? FLUoxetine  20 mg Oral QHS  ? gabapentin  200 mg Oral QHS  ? insulin aspart  0-6 Units Subcutaneous TID WC  ? losartan  100 mg Oral Daily  ? multivitamin  1 tablet Oral QHS  ? pantoprazole  40 mg Oral Daily  ? revefenacin  175 mcg Nebulization Daily  ? sodium chloride flush  3 mL Intravenous Q12H  ? sodium chloride flush  3 mL  Intravenous Q12H  ? sucroferric oxyhydroxide  500 mg Oral TID WC  ? ? ?Assessment: ?66 yo female with critical limb ischemia s/p endarterectomy and bypass (3/22) on heparin at 500 units/hr. Pharmacy to increase to full dose anticoagulation ?-hg= 9.8 ? ?3/24 AM update:  ?Heparin level low ? ?Goal  of Therapy:  ?Heparin level 0.3-0.7 units/ml ?Monitor platelets by anticoagulation protocol: Yes ?  ?Plan:  ?-No heparin bolus with recent procedure ?-Increase heparin to 1150 units/hr ?-Heparin level in 8 hours and daily wth CBC daily ? ?Narda Bonds, PharmD, BCPS ?Clinical Pharmacist ?Phone: [phone] ? ? ? ? ? ?

## 2021-11-30 NOTE — Progress Notes (Addendum)
ANTICOAGULATION CONSULT NOTE  ? ?Pharmacy Consult for heparin  ?Indication: Critical limb ischemia ? ?Allergies  ?Allergen Reactions  ? Umeclidinium Bromide Anaphylaxis  ? Codeine Nausea And Vomiting  ? Adhesive [Tape] Other (See Comments)  ?  Tape breaks out the skin if it is left on for a lengthy period of time  ? ? ?Patient Measurements: ?Height: 5\' 2"  (157.5 cm) ?Weight: 81.9 kg (180 lb 8.9 oz) ?IBW/kg (Calculated) : 50.1 ?HEPARIN DW (KG): 66.7 ? ? ? ?Vital Signs: ?Temp: 97.6 ?F (36.4 ?C) (03/24 0729) ?Temp Source: Oral (03/24 0729) ?BP: 139/60 (03/24 1100) ?Pulse Rate: 62 (03/24 1100) ? ?Labs: ?Recent Labs  ?  11/28/21 ?0230 11/28/21 ?1401 11/29/21 ?0439 11/29/21 ?1613 11/29/21 ?2134 11/30/21 ?0932 11/30/21 ?0943  ?HGB 9.2*   < > 9.8* 9.9*  --  8.4*  --   ?HCT 31.5*   < > 32.0* 31.8*  --  28.2*  --   ?PLT 207   < > 192 183  --  177  --   ?APTT  --   --  37*  --   --  52*  --   ?HEPARINUNFRC  --   --   --   --  <0.10*  --  <0.10*  ?CREATININE 6.66*  --  7.51* 4.14*  --   --   --   ? < > = values in this interval not displayed.  ? ? ? ?Estimated Creatinine Clearance: 13.6 mL/min (A) (by C-G formula based on SCr of 4.14 mg/dL (H)). ? ? ?Medical History: ?Past Medical History:  ?Diagnosis Date  ? Anemia   ? CAD (coronary artery disease)   ? Chronic respiratory failure with hypoxia (HCC)   ? COPD (chronic obstructive pulmonary disease) ([place]) 05/2019  ? no inhaler  ? Diabetes mellitus without complication ([place])   ? no meds - diet controlled  ? ESRD on hemodialysis ([place])   ? History of blood transfusion 09/2020  ? 1 unit  ? HLD (hyperlipidemia)   ? Hypertension   ? Peripheral vascular disease ([place])   ? PONV (postoperative nausea and vomiting)   ? Restless legs syndrome (RLS)   ? Stroke [facility])   ? ? ?Medications:  ?Medications Prior to Admission  ?Medication Sig Dispense Refill Last Dose  ? acetaminophen (TYLENOL) 500 MG tablet Take 1,000 mg by mouth every 6 (six) hours as needed for mild pain or headache.   Past Month   ? albuterol (PROVENTIL) (2.5 MG/3ML) 0.083% nebulizer solution Take 3 mLs (2.5 mg total) by nebulization every 6 (six) hours as needed for wheezing or shortness of breath. (Patient taking differently: Take 2.5 mg by nebulization at bedtime.) 75 mL 0 Past Month  ? albuterol (VENTOLIN HFA) 108 (90 Base) MCG/ACT inhaler Inhale 2 puffs into the lungs every 6 (six) hours as needed for wheezing or shortness of breath. 18 g 2 11/20/2021  ? aspirin 325 MG tablet Take 325 mg by mouth daily.   11/20/2021  ? Aspirin-Caffeine (BAYER BACK & BODY PO) Take 1 tablet by mouth daily as needed (headache/pain).   Past Week  ? atorvastatin (LIPITOR) 40 MG tablet Take 1 tablet (40 mg total) by mouth daily at 6 PM.   11/20/2021  ? B Complex-C-Zn-Folic Acid (DIALYVITE 355 WITH ZINC) 0.8 MG TABS Take 1 tablet by mouth at bedtime.   11/20/2021  ? carvedilol (COREG) 12.5 MG tablet Take 1 tablet (12.5 mg total) by mouth 2 (two) times daily with a meal. (Patient taking differently: Take  12.5 mg by mouth See admin instructions. Take one tablet (12.5 mg) by mouth on Sunday, Tuesday, Thursday mornings (non-dialysis days); take one tablet (12.5 mg) daily at bedtime)   11/20/2021 at 2100  ? FLUoxetine (PROZAC) 20 MG capsule Take 1 capsule (20 mg total) by mouth daily. (Patient taking differently: Take 20 mg by mouth at bedtime.) 30 capsule 2 11/20/2021  ? gabapentin (NEURONTIN) 100 MG capsule Take 1 capsule (100 mg total) by mouth 2 (two) times daily. (Patient taking differently: Take 200 mg by mouth at bedtime.) 60 capsule 0 11/20/2021  ? hydrOXYzine (ATARAX) 25 MG tablet Take 25 mg by mouth 3 (three) times daily as needed for anxiety.   unk  ? lidocaine-prilocaine (EMLA) cream Apply 1 application topically See admin instructions. Apply topically to port access one hour prior to dialysis - Monday, Wednesday, Friday, Saturday   11/21/2021  ? loperamide (IMODIUM A-D) 2 MG tablet Take 1 tablet (2 mg total) by mouth 4 (four) times daily as needed for diarrhea  or loose stools. (Patient taking differently: Take 2 mg by mouth See admin instructions. Take one tablet (2 mg) by mouth prior to dialysis on Monday, Wednesday, Friday and Saturday) 30 tablet 1 Past Week  ? losartan (COZAAR) 100 MG tablet Take 1 tablet (100 mg total) by mouth every evening. (Patient taking differently: Take 100 mg by mouth See admin instructions. Take one tablet (100 mg) by mouth at noon on Sunday, Tuesday, Thursday (non-dialysis days); take one tablet (100 mg) at bedtime on Monday, Wednesday, Friday, Saturday (dialysis days)) 30 tablet 6 11/20/2021  ? ondansetron (ZOFRAN) 4 MG tablet Take 1 tablet (4 mg total) by mouth every 8 (eight) hours as needed for nausea or vomiting. 60 tablet 3 unk  ? Tiotropium Bromide Monohydrate (SPIRIVA RESPIMAT) 2.5 MCG/ACT AERS Inhale 2 puffs into the lungs daily. 4 g 3 11/20/2021  ? glucose blood (TRUETEST TEST) test strip Use as instructed 100 each 12   ? Lancets (FREESTYLE) lancets Use as instructed 100 each 12   ? OXYGEN Inhale 4 L/min into the lungs continuous.     ? TRUEplus Lancets 28G MISC 1 each by Does not apply route 3 (three) times daily before meals. 100 each 12   ? ?Scheduled:  ? arformoterol  15 mcg Nebulization BID  ? aspirin  325 mg Oral Daily  ? atorvastatin  80 mg Oral q1800  ? budesonide (PULMICORT) nebulizer solution  0.25 mg Nebulization BID  ? carvedilol  12.5 mg Oral QHS  ? And  ? carvedilol  12.5 mg Oral Once per day on Sun Tue Thu  ? Chlorhexidine Gluconate Cloth  6 each Topical Daily  ? darbepoetin (ARANESP) injection - DIALYSIS  25 mcg Intravenous Q Mon-HD  ? docusate sodium  100 mg Oral Daily  ? doxercalciferol  1 mcg Intravenous Q M,W,F-HD  ? FLUoxetine  20 mg Oral QHS  ? gabapentin  200 mg Oral QHS  ? insulin aspart  0-6 Units Subcutaneous TID WC  ? losartan  100 mg Oral Daily  ? multivitamin  1 tablet Oral QHS  ? pantoprazole  40 mg Oral Daily  ? revefenacin  175 mcg Nebulization Daily  ? sodium chloride flush  3 mL Intravenous Q12H  ?  sodium chloride flush  3 mL Intravenous Q12H  ? sucroferric oxyhydroxide  500 mg Oral TID WC  ? ? ?Assessment: ?66 yo female with critical limb ischemia s/p endarterectomy and bypass (3/22) on heparin at 500 units/hr. Pharmacy to  increase to full dose anticoagulation ?-heparin level remains < 0.1 ?-hg= 8.4 ? ?Goal of Therapy:  ?Heparin level 0.3-0.7 units/ml ?Monitor platelets by anticoagulation protocol: Yes ?  ?Plan:  ?-No heparin bolus with recent procedure ?-Increase heparin to 1350 units/hr ?-Heparin level in 8 hours and daily wth CBC daily ? ?Hildred Laser, PharmD ?Clinical Pharmacist ?**Pharmacist phone directory can now be found on [email or web address] ([email or web address]).  Listed under [facility]. ? ?Addendum ?-change to apixaban (cost $0) ?-No data to direct  apixaban dosing in bypass patency but can not use rivaroxaban with ESRD. May be best to avoid warfarin.  ? ?Plan ?-Apixaban 5mg  po bid ?-Decrease aspirin to 81mg /d ? ?Hildred Laser, PharmD ?Clinical Pharmacist ?**Pharmacist phone directory can now be found on [email or web address] ([email or web address]).  Listed under [place]. ? ? ? ? ? ? ?

## 2021-11-30 NOTE — Progress Notes (Addendum)
Nutrition Follow-up ? ?DOCUMENTATION CODES:  ? ?Non-severe (moderate) malnutrition in context of chronic illness ? ?INTERVENTION:  ? ?Nepro Shake po TID, each supplement provides 425 kcal and 19 grams protein ? ?Magic cup BID with meals, each supplement provides 290 kcal and 9 grams of protein ? ?Continue Renal MVI ? ?Liberalize diet to REGULAR ? ? ?NUTRITION DIAGNOSIS:  ? ?Moderate Malnutrition related to chronic illness (ESRD on HD, CHF, COPD, limb ischemia) as evidenced by mild muscle depletion, mild fat depletion. ? ?Being addressed via supplements ? ?GOAL:  ? ?Patient will meet greater than or equal to 90% of their needs ? ?Progressing ? ?MONITOR:  ? ?PO intake, Supplement acceptance, Weight trends, Diet advancement ? ?REASON FOR ASSESSMENT:  ? ?Malnutrition Screening Tool ?  ? ?ASSESSMENT:  ? ?66 year old pt with hx of DM type 2, HTN, HLD, ESRD on HD, CHF, COPD, hx CVA, and tobacco use sent to ED from PCP office with concerns for limb ischemia to the bilateral feet. Pt reports ongoing pain acutely worsening over the last few weeks. Toes discolored and cool to the touch. ? ?3/22 Endarterectomy, of the right external iliac, common femoral, right profunda femoris, right popliteal and tibial peroneal arteries as well as a femoral to tibial peroneal trunk bypass ? ?Pt going to iHD today ? ?Sister at bedside. Pt lethargic on visit today. Arousable but falls back asleep quickly ? ?Due to mental status, pt did not eat this morning. Pt responds minimally to questions, unable to obtain pt info. Pt does respond to pain.  ? ?Per sister, pt has been dealing with issues related to vascular disease/limb ischemia for 3 years. Pt has experienced gradual wt loss over this time as well as gradual loss of physical function. Sister believes pt eats 3 meals per day but eats less at each meal than she used to. Pt does not use protein supplements at home.  ? ?Labs: reviewed ?Meds: ss novolog, iron sucrose, renal MVI, mag  sulfate ? ?NUTRITION - FOCUSED PHYSICAL EXAM: ? ?Flowsheet Row Most Recent Value  ?Orbital Region Mild depletion  ?Upper Arm Region No depletion  ?Thoracic and Lumbar Region No depletion  ?Buccal Region Mild depletion  ?Temple Region Mild depletion  ?Clavicle Bone Region Mild depletion  ?Clavicle and Acromion Bone Region Mild depletion  ?Scapular Bone Region Mild depletion  ?Dorsal Hand No depletion  ?Patellar Region Moderate depletion  ?Anterior Thigh Region Moderate depletion  ?Posterior Calf Region Moderate depletion  ?Edema (RD Assessment) Mild  ? ?  ? ? ?Diet Order:   ?Diet Order   ? ?       ?  Diet regular Room service appropriate? Yes; Fluid consistency: Thin  Diet effective now       ?  ? ?  ?  ? ?  ? ? ?EDUCATION NEEDS:  ? ?No education needs have been identified at this time ? ?Skin:  Skin Assessment: Skin Integrity Issues: ?Skin Integrity Issues:: Wound VAC ?Wound Vac: incisional: right leg/groin ? ?Last BM:  3/21 ? ?Height:  ? ?Ht Readings from Last 1 Encounters:  ?11/22/21 5\' 2"  (1.575 m)  ? ? ?Weight:  ? ?Wt Readings from Last 1 Encounters:  ?11/30/21 77.9 kg  ? ? ?Ideal Body Weight:  50 kg ? ?BMI:  Body mass index is 31.41 kg/m?. ? ?Estimated Nutritional Needs:  ? ?Kcal:  1800-2000 kcal/d ? ?Protein:  90-100 g/d ? ?Fluid:  1L + UOP ? ? ?Kerman Passey MS, RDN, LDN, CNSC ?Registered Dietitian III ?Clinical  Nutrition ?RD Pager and On-Call Pager Number Located in [place]  ? ?

## 2021-11-30 NOTE — PMR Pre-admission (Shared)
PMR Admission Coordinator Pre-Admission Assessment ? ?Patient: Tina Gomez is an 66 y.o., female ?MRN: [id number] ?DOB: 11/22/1955 ?Height: 5\' 2"  (157.5 cm) ?Weight: 81.9 kg ? ?Insurance Information ?HMO: ***    PPO: ***     PCP:      IPA:      80/20:      OTHER:  ?PRIMARY: U[employer] Medicare      Policy#: [id number]      Subscriber: patient ?CM Name: ***      Phone#: ***     Fax#: *** ?Pre-Cert#: ***      Employer: *** ?Benefits:  Phone #: ***     Name: *** ?Eff. Date: ***     Deduct: ***      Out of Pocket Max: ***      Life Max: *** ?CIR: ***      SNF: *** ?Outpatient: ***     Co-Pay: *** ?Home Health: ***      Co-Pay: *** ?DME: ***     Co-Pay: *** ?Providers: in-network ?SECONDARY: Medicaid of [place]      Policy#: [id number] n     Phone#: [phone] ? ?Financial Counselor:       Phone#:  ? ?The ?Data Collection Information Summary? for patients in Inpatient Rehabilitation Facilities with attached ?Privacy Act [place] Records? was provided and verbally reviewed with: {CHL IP Patient Family SW:[id number]} ? ?Emergency Contact Information ?Contact Information   ? ? Name Relation Home Work Mobile  ? A [place] Daughter [phone]  [phone]  ? ?  ? ? ?Current Medical History  ?Patient Admitting Diagnosis: limb ischemia of lower extremities ?History of Present Illness: *** ?  ? ?Patient's medical record from [facility] has been reviewed by the rehabilitation admission coordinator and physician. ? ?Past Medical History  ?Past Medical History:  ?Diagnosis Date  ? Anemia   ? CAD (coronary artery disease)   ? Chronic respiratory failure with hypoxia (HCC)   ? COPD (chronic obstructive pulmonary disease) ([place]) 05/2019  ? no inhaler  ? Diabetes mellitus without complication ([place])   ? no meds - diet controlled  ? ESRD on hemodialysis ([place])   ? History of blood transfusion 09/2020  ? 1 unit  ? HLD (hyperlipidemia)   ? Hypertension   ? Peripheral vascular disease ([place])   ? PONV (postoperative nausea and  vomiting)   ? Restless legs syndrome (RLS)   ? Stroke [facility])   ? ? ?Has the patient had major surgery during 100 days prior to admission? Yes ? ?Family History   ?family history includes Hyperlipidemia in her father and mother; Hypertension in her father and mother; Stroke in her mother. ? ?Current Medications ? ?Current Facility-Administered Medications:  ?  0.9 %  sodium chloride infusion, 10 mL/hr, Intravenous, Once, Byrum, Rose Fillers, MD ?  0.9 %  sodium chloride infusion, 500 mL, Intravenous, Once PRN, Byrum, Rose Fillers, MD ?  0.9 %  sodium chloride infusion, , Intravenous, Continuous, Byrum, Rose Fillers, MD, Last Rate: 75 mL/hr at 11/28/21 1523, New Bag at 11/28/21 1523 ?  0.9 %  sodium chloride infusion, 250 mL, Intravenous, Continuous, Byrum, Rose Fillers, MD ?  0.9 %  sodium chloride infusion, 100 mL, Intravenous, PRN, Byrum, Rose Fillers, MD ?  0.9 %  sodium chloride infusion, 100 mL, Intravenous, PRN, Byrum, Rose Fillers, MD ?  0.9 %  sodium chloride infusion, 100 mL, Intravenous, PRN, Byrum, Rose Fillers, MD ?  0.9 %  sodium chloride infusion, 100 mL, Intravenous, PRN, Byrum,  Rose Fillers, MD ?  acetaminophen (TYLENOL) tablet 650 mg, 650 mg, Oral, Q6H PRN, 650 mg at 11/24/21 0003 **OR** acetaminophen (TYLENOL) suppository 650 mg, 650 mg, Rectal, Q6H PRN, Byrum, Rose Fillers, MD ?  albuterol (PROVENTIL) (2.5 MG/3ML) 0.083% nebulizer solution 2.5 mg, 2.5 mg, Nebulization, Q6H PRN, Byrum, Rose Fillers, MD ?  alum & mag hydroxide-simeth (MAALOX/MYLANTA) 200-200-20 MG/5ML suspension 15-30 mL, 15-30 mL, Oral, Q2H PRN, Byrum, Rose Fillers, MD ?  apixaban (ELIQUIS) tablet 5 mg, 5 mg, Oral, BID, Kris Mouton, RPH ?  arformoterol (BROVANA) nebulizer solution 15 mcg, 15 mcg, Nebulization, BID, Collene Gobble, MD, 15 mcg at 11/30/21 0102 ?  aspirin tablet 325 mg, 325 mg, Oral, Daily, Collene Gobble, MD, 325 mg at 11/30/21 7253 ?  atorvastatin (LIPITOR) tablet 80 mg, 80 mg, Oral, q1800, Collene Gobble, MD, 80 mg at 11/27/21 1814 ?  budesonide  (PULMICORT) nebulizer solution 0.25 mg, 0.25 mg, Nebulization, BID, Byrum, Rose Fillers, MD, 0.25 mg at 11/30/21 6644 ?  carvedilol (COREG) tablet 12.5 mg, 12.5 mg, Oral, QHS, 12.5 mg at 11/29/21 2209 **AND** carvedilol (COREG) tablet 12.5 mg, 12.5 mg, Oral, Once per day on Sun Tue Thu, Byrum, Robert S, MD, 12.5 mg at 11/27/21 0347 ?  Chlorhexidine Gluconate Cloth 2 % PADS 6 each, 6 each, Topical, Daily, Collene Gobble, MD, 6 each at 11/29/21 2118 ?  Darbepoetin Alfa (ARANESP) injection 25 mcg, 25 mcg, Intravenous, Q Mon-HD, Collene Gobble, MD, 25 mcg at 11/26/21 1029 ?  docusate sodium (COLACE) capsule 100 mg, 100 mg, Oral, Daily, Collene Gobble, MD, 100 mg at 11/30/21 4259 ?  doxercalciferol (HECTOROL) injection 1 mcg, 1 mcg, Intravenous, Q M,W,F-HD, Collene Gobble, MD, 1 mcg at 11/26/21 1031 ?  fentaNYL (SUBLIMAZE) injection 50-100 mcg, 50-100 mcg, Intravenous, Q2H PRN, Collene Gobble, MD, 50 mcg at 11/30/21 0940 ?  FLUoxetine (PROZAC) capsule 20 mg, 20 mg, Oral, QHS, Byrum, Rose Fillers, MD, 20 mg at 11/29/21 2210 ?  gabapentin (NEURONTIN) capsule 200 mg, 200 mg, Oral, QHS, Collene Gobble, MD, 200 mg at 11/28/21 2125 ?  guaiFENesin-dextromethorphan (ROBITUSSIN DM) 100-10 MG/5ML syrup 15 mL, 15 mL, Oral, Q4H PRN, Byrum, Rose Fillers, MD ?  hydrALAZINE (APRESOLINE) injection 5 mg, 5 mg, Intravenous, Q20 Min PRN, Byrum, Rose Fillers, MD ?  HYDROmorphone (DILAUDID) injection 1 mg, 1 mg, Intravenous, Q2H PRN, Collene Gobble, MD, 1 mg at 11/30/21 5638 ?  hydrOXYzine (ATARAX) tablet 25 mg, 25 mg, Oral, TID PRN, Collene Gobble, MD, 25 mg at 11/24/21 0004 ?  insulin aspart (novoLOG) injection 0-6 Units, 0-6 Units, Subcutaneous, TID WC, Collene Gobble, MD, 1 Units at 11/26/21 1707 ?  iron sucrose (VENOFER) 50 mg in sodium chloride 0.9 % 100 mL IVPB, 50 mg, Intravenous, Q Wed-HD, Byrum, Rose Fillers, MD ?  labetalol (NORMODYNE) injection 10 mg, 10 mg, Intravenous, Q10 min PRN, Collene Gobble, MD, 10 mg at 11/29/21 2049 ?  labetalol  (NORMODYNE) injection 10 mg, 10 mg, Intravenous, Q2H PRN, Byrum, Rose Fillers, MD ?  lidocaine (PF) (XYLOCAINE) 1 % injection 5 mL, 5 mL, Intradermal, PRN, Byrum, Rose Fillers, MD ?  lidocaine-prilocaine (EMLA) cream 1 application., 1 application., Topical, PRN, Collene Gobble, MD ?  losartan (COZAAR) tablet 100 mg, 100 mg, Oral, Daily, Collene Gobble, MD, 100 mg at 11/27/21 7564 ?  magnesium sulfate IVPB 2 g 50 mL, 2 g, Intravenous, Daily PRN, Collene Gobble, MD ?  multivitamin (RENA-VIT)  tablet 1 tablet, 1 tablet, Oral, QHS, Collene Gobble, MD, 1 tablet at 11/28/21 2124 ?  ondansetron (ZOFRAN) tablet 4 mg, 4 mg, Oral, Q6H PRN **OR** ondansetron (ZOFRAN) injection 4 mg, 4 mg, Intravenous, Q6H PRN, Collene Gobble, MD, 4 mg at 11/23/21 1757 ?  ondansetron (ZOFRAN) injection 4 mg, 4 mg, Intravenous, Q6H PRN, Collene Gobble, MD, 4 mg at 11/30/21 1239 ?  pantoprazole (PROTONIX) EC tablet 40 mg, 40 mg, Oral, Daily, Collene Gobble, MD, 40 mg at 11/30/21 3143 ?  pentafluoroprop-tetrafluoroeth (GEBAUERS) aerosol 1 application., 1 application., Topical, PRN, Byrum, Rose Fillers, MD ?  phenol (CHLORASEPTIC) mouth spray 1 spray, 1 spray, Mouth/Throat, PRN, Byrum, Rose Fillers, MD ?  potassium chloride SA (KLOR-CON M) CR tablet 20-40 mEq, 20-40 mEq, Oral, Daily PRN, Byrum, Rose Fillers, MD ?  revefenacin (YUPELRI) nebulizer solution 175 mcg, 175 mcg, Nebulization, Daily, Collene Gobble, MD, 175 mcg at 11/30/21 8887 ?  senna-docusate (Senokot-S) tablet 1 tablet, 1 tablet, Oral, QHS PRN, Byrum, Rose Fillers, MD ?  sodium chloride flush (NS) 0.9 % injection 3 mL, 3 mL, Intravenous, Q12H, Collene Gobble, MD, 3 mL at 11/30/21 0933 ?  sodium chloride flush (NS) 0.9 % injection 3 mL, 3 mL, Intravenous, Q12H, Collene Gobble, MD, 3 mL at 11/30/21 0933 ?  sodium chloride flush (NS) 0.9 % injection 3 mL, 3 mL, Intravenous, PRN, Byrum, Rose Fillers, MD ?  sucroferric oxyhydroxide (VELPHORO) chewable tablet 500 mg, 500 mg, Oral, TID WC, Byrum, Rose Fillers, MD,  500 mg at 11/27/21 1814 ?  traMADol (ULTRAM) tablet 50 mg, 50 mg, Oral, Q12H PRN, Collene Gobble, MD, 50 mg at 11/30/21 5797 ? ?Patients Current Diet:  ?Diet Order   ? ?       ?  Diet Heart Room service appropr

## 2021-11-30 NOTE — Progress Notes (Addendum)
?Progress Note ? ? ? ?11/30/2021 ?7:15 AM ?2 Days Post-Op ? ?Subjective:  sleepy this morning ? ?afebrile ? ?Vitals:  ? 11/30/21 0600 11/30/21 0700  ?BP: (!) 172/59 (!) 145/57  ?Pulse: 65 63  ?Resp: 15 16  ?Temp:    ?SpO2: 99% 100%  ? ? ?Physical Exam: ?General:  sleeping; awakes to voice and follows commands and answers questions ?Lungs:  non labored ?Incisions:  right groin with Prevena vac with good seal and right BK incision looks good.  ?Extremities:  +right DP and PT doppler signals.  Right foot is warm ? ? ?CBC ?   ?Component Value Date/Time  ? WBC 13.0 (H) 11/30/2021 0048  ? RBC 3.01 (L) 11/30/2021 0048  ? HGB 8.4 (L) 11/30/2021 0048  ? HGB 9.5 (L) 02/06/2021 1610  ? HCT 28.2 (L) 11/30/2021 0048  ? HCT 31.3 (L) 02/06/2021 9604  ? PLT 177 11/30/2021 0048  ? PLT 299 02/06/2021 0927  ? MCV 93.7 11/30/2021 0048  ? MCV 93 02/06/2021 0927  ? MCH 27.9 11/30/2021 0048  ? MCHC 29.8 (L) 11/30/2021 0048  ? RDW 16.8 (H) 11/30/2021 0048  ? RDW 15.8 (H) 02/06/2021 5409  ? LYMPHSABS 1.5 11/21/2021 1842  ? LYMPHSABS 1.6 02/06/2021 0927  ? MONOABS 0.6 11/21/2021 1842  ? EOSABS 0.1 11/21/2021 1842  ? EOSABS 0.2 02/06/2021 0927  ? BASOSABS 0.0 11/21/2021 1842  ? BASOSABS 0.0 02/06/2021 8119  ? ? ?BMET ?   ?Component Value Date/Time  ? NA 137 11/29/2021 1613  ? NA 141 11/24/2020 1304  ? K 3.9 11/29/2021 1613  ? CL 101 11/29/2021 1613  ? CO2 29 11/29/2021 1613  ? GLUCOSE 111 (H) 11/29/2021 1613  ? BUN 23 11/29/2021 1613  ? BUN 47 (H) 11/24/2020 1304  ? CREATININE 4.14 (H) 11/29/2021 1613  ? CREATININE 1.25 (H) 11/13/2016 1018  ? CALCIUM 8.2 (L) 11/29/2021 1613  ? CALCIUM 5.8 (LL) 07/24/2020 0343  ? GFRNONAA 11 (L) 11/29/2021 1613  ? GFRNONAA 47 (L) 11/13/2016 1018  ? GFRAA 13 (L) 06/15/2020 1134  ? GFRAA 54 (L) 11/13/2016 1018  ? ? ?INR ?   ?Component Value Date/Time  ? INR 1.2 11/21/2021 2010  ? ? ? ?Intake/Output Summary (Last 24 hours) at 11/30/2021 0715 ?Last data filed at 11/30/2021 0700 ?Gross per 24 hour  ?Intake 342.11 ml   ?Output 0 ml  ?Net 342.11 ml  ? ? ? ?Assessment/Plan:  66 y.o. female is s/p:  ?1) Right external iliac, common femoral and extensive right profunda femoris endarterectomy (second order branches of profunda) and bovine patch angioplasty ?2) Right popliteal and tibioperoneal trunk endarterectomy and bovine patch angioplasty ?3) Right common femoral to tibioperoneal trunk bypass with 67mm ePTFE ?4) Right lower extremity angiogram   ?2 Days Post-Op ? ? ?-continues to have +doppler signals right DP/PT ?-PT/OT recommending CIR.  Continue mobilizing out of bed.  ?-DVT prophylaxis:  full dose heparin gtt started yesterday.  Will transition to eliquis prior to discharge to promote graft patency. ?-pt will f/u with Dr. Stanford Breed in 2-3 weeks in the office for further discussions for LLE bypass.  The office will arrange this appt.  ? ? ?Leontine Locket, PA-C ?[facility] ?[phone] ?11/30/2021 ?7:15 AM ? ?VASCULAR STAFF ADDENDUM: ?I have independently interviewed and examined the patient. ?I agree with the above.  ?Good flow at the ankle and foot. ?Good capillary refill. ?Prevena to groin; will continue until Monday. ?Mobilize as able. ?To stepdown unit today. ?OK to transition  to Eliquis.  ? ?[name]. Stanford Breed, MD ?[facility] [place] ?Office Phone Number: [phone] ?11/30/2021 11:54 AM ? ? ? ? ?

## 2021-11-30 NOTE — TOC Progression Note (Signed)
Transition of Care (TOC) - Progression Note  ? ? ?Patient Details  ?Name: Tina Gomez ?MRN: [id number] ?Date of Birth: 1956/07/03 ? ?Transition of Care (TOC) CM/SW Contact  ?Tom-Johnson, Renea Ee, RN ?Phone Number: ?11/30/2021, 3:23 PM ? ?Clinical Narrative:    ? ?AIR recommended by PT/OT. AIR rehabilitation assessment and goals expectations discussed with patient. Awaiting AIR approval. TOC will continue to follow with needs. ? ?Expected Discharge Plan: [place] ?Barriers to Discharge: Continued Medical Work up ? ?Expected Discharge Plan and Services ?Expected Discharge Plan: [place] ?  ?  ?  ?Living arrangements for the past 2 months: [place] ?                ?  ?  ?  ?  ?  ?  ?  ?  ?  ?  ? ? ?Social Determinants of Health (SDOH) Interventions ?  ? ?Readmission Risk Interventions ? ?  09/13/2021  ? 11:57 AM 04/13/2021  ?  1:10 PM 07/28/2020  ?  2:50 PM  ?Readmission Risk Prevention Plan  ?Transportation Screening Complete Complete Complete  ?PCP or Specialist Appt within 3-5 Days   Not Complete  ?Not Complete comments   first available PCP is 12/16  ?[place] or Home Care Consult   Complete  ?Social Work Consult for [place] Planning/Counseling   Complete  ?Palliative Care Screening   Not Applicable  ?Medication Review (RN Care Manager) Referral to Pharmacy Complete   ?PCP or Specialist appointment within 3-5 days of discharge Complete Complete   ?[place] or Home Care Consult Complete Complete   ?SW Recovery Care/Counseling Consult Complete Complete   ?Palliative Care Screening Complete Not Applicable   ?[place] Not Applicable Not Applicable   ? ? ?

## 2021-11-30 NOTE — Progress Notes (Signed)
Inpatient Rehab Admissions: ? ?Inpatient Rehab Consult received.  I met with patient and daughter, Tina Gomez at the bedside for rehabilitation assessment and to discuss goals and expectations of an inpatient rehab admission.  Both acknowledged understanding of CIR goals and expectations. Both interested in pt pursuing CIR. Tina Gomez confirmed that family will be able to provide support.Will continue to follow. ? ?Signed: ?Gayland Curry, MS, CCC-SLP ?Admissions Coordinator ?[phone] ? ? ?

## 2021-11-30 NOTE — Progress Notes (Signed)
Physical Therapy Treatment ?Patient Details ?Name: Tina Gomez ?MRN: [id number] ?DOB: [birth year] ?Today's Date: 11/30/2021 ? ? ?History of Present Illness 66 y.o. female who presented 11/21/21 for evaluation of lower limb ischemia. S/p CT aortogram and angiogram of lower extremities 3/17. S/p R lower extremity angiogram, endarterectomy, angioplasty, and common femoral to tibioperoneal trunk bypass 3/22 with post-op hypotension. 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 ? ?  ?PT Comments  ? ? Pt needing encouragement to mobilize today, requesting pain meds which RN did administer during session. Pt was able to progress to only needing modA to transition supine > sit EOB today, but is still needing increased time due to slow processing and problem-solving, generalized weakness, and pain impacting R lower extremity mobility. Pt requiring maxA to power up to stand at EOB, but quickly progressed to only needing minA to stand statically with RW. Pt only able to stand for <30 sec before sitting due to pain though. Pt performed LAQ with R leg to improve AROM (moving through partial ROM) while sitting EOB when transport arrived to take pt to HD session. Will continue to follow acutely. Current recommendations remain appropriate provided pt's tolerance to mobility improves. ?   ?Recommendations for follow up therapy are one component of a multi-disciplinary discharge planning process, led by the attending physician.  Recommendations may be updated based on patient status, additional functional criteria and insurance authorization. ? ?Follow Up Recommendations ? Acute inpatient rehab (3hours/day) ?  ?  ?Assistance Recommended at Discharge Frequent or constant Supervision/Assistance  ?Patient can return home with the following A lot of help with walking and/or transfers;Two people to help with walking and/or transfers;A lot of help with  bathing/dressing/bathroom;Two people to help with bathing/dressing/bathroom;Assistance with cooking/housework;Direct supervision/assist for medications management;Direct supervision/assist for financial management;Assist for transportation;Help with stairs or ramp for entrance ?  ?Equipment Recommendations ? Rolling walker (2 wheels)  ?  ?Recommendations for Other Services   ? ? ?  ?Precautions / Restrictions Precautions ?Precautions: Fall ?Precaution Comments: R groin wound vac ?Restrictions ?Weight Bearing Restrictions: No  ?  ? ?Mobility ? Bed Mobility ?Overal bed mobility: Needs Assistance ?Bed Mobility: Supine to Sit, Sit to Supine ?  ?  ?Supine to sit: Mod assist, HOB elevated ?Sit to supine: Total assist ?  ?General bed mobility comments: Extra time and use of R sock to help R leg off EOB. Used bed pad to pivot and scoot hips, modA. TA to return to supine. ?  ? ?Transfers ?Overall transfer level: Needs assistance ?Equipment used: Rolling walker (2 wheels) ?Transfers: Sit to/from Stand ?Sit to Stand: Max assist, From elevated surface ?  ?  ?  ?  ?  ?General transfer comment: EOB elevated, cuing pt to push R foot anterior if needed to reduce WB and improve pain. MaxA under buttocks to power up to stand and extend hips. ?  ? ?Ambulation/Gait ?  ?  ?  ?  ?  ?  ?  ?General Gait Details: deferred ? ? ?Stairs ?  ?  ?  ?  ?  ? ? ?Wheelchair Mobility ?  ? ?Modified Rankin (Stroke Patients Only) ?  ? ? ?  ?Balance Overall balance assessment: Needs assistance ?Sitting-balance support: No upper extremity supported, Feet supported ?Sitting balance-Leahy Scale: Fair ?Sitting balance - Comments: Static sitting EOB with min guard. ?  ?Standing balance support: Bilateral upper extremity supported, Reliant on  assistive device for balance ?Standing balance-Leahy Scale: Poor ?Standing balance comment: Initially maxA but progressed to minA with time with bil UE support on RW. ?  ?  ?  ?  ?  ?  ?  ?  ?  ?  ?  ?  ? ?  ?Cognition  Arousal/Alertness: Awake/alert ?Behavior During Therapy: [facility] for tasks assessed/performed ?Overall Cognitive Status: Within Functional Limits for tasks assessed ?  ?  ?  ?  ?  ?  ?  ?  ?  ?  ?  ?  ?  ?  ?  ?  ?General Comments: Initially WFL, but became more lethargic and slow to process info after given pain meds by RN during session. ?  ?  ? ?  ?Exercises General Exercises - Lower Extremity ?Long Arc Quad: AROM, Right, 10 reps, Seated (not moving through full ROM) ? ?  ?General Comments   ?  ?  ? ?Pertinent Vitals/Pain Pain Assessment ?Pain Assessment: Faces ?Faces Pain Scale: Hurts even more ?Pain Location: R lower extremity ?Pain Descriptors / Indicators: Discomfort, Grimacing, Operative site guarding ?Pain Intervention(s): Limited activity within patient's tolerance, Monitored during session, Repositioned, Patient requesting pain meds-RN notified, RN gave pain meds during session  ? ? ?Home Living   ?Living Arrangements: Children ?Available Help at Discharge: Family;Available 24 hours/day ?Type of Home: House ?Home Access: Ramped entrance ?  ?  ?  ?Home Layout: Multi-level;Able to live on main level with bedroom/bathroom ?  ?   ?  ?Prior Function    ?  ?  ?   ? ?PT Goals (current goals can now be found in the care plan section) Acute Rehab PT Goals ?Patient Stated Goal: to not hurt ?PT Goal Formulation: With patient/family ?Time For Goal Achievement: 12/13/21 ?Potential to Achieve Goals: Good ?Progress towards PT goals: Progressing toward goals ? ?  ?Frequency ? ? ? Min 3X/week ? ? ? ?  ?PT Plan Current plan remains appropriate  ? ? ?Co-evaluation   ?  ?  ?  ?  ? ?  ?AM-PAC PT "6 Clicks" Mobility   ?Outcome Measure ? Help needed turning from your back to your side while in a flat bed without using bedrails?: A Lot ?Help needed moving from lying on your back to sitting on the side of a flat bed without using bedrails?: A Lot ?Help needed moving to and from a bed to a chair (including a wheelchair)?: Total ?Help  needed standing up from a chair using your arms (e.g., wheelchair or bedside chair)?: A Lot ?Help needed to walk in hospital room?: Total ?Help needed climbing 3-5 steps with a railing? : Total ?6 Click Score: 9 ? ?  ?End of Session Equipment Utilized During Treatment: Oxygen ?Activity Tolerance: Patient limited by pain ?Patient left: in bed;with call bell/phone within reach;with bed alarm set;Other (comment) (with transport present to take pt to HD) ?Nurse Communication: Mobility status;Patient requests pain meds ?PT Visit Diagnosis: Unsteadiness on feet (R26.81);Muscle weakness (generalized) (M62.81);History of falling (Z91.81);Difficulty in walking, not elsewhere classified (R26.2);Pain ?Pain - Right/Left: Right ?Pain - part of body: Ankle and joints of foot ?  ? ? ?Time: 8416-6063 ?PT Time Calculation (min) (ACUTE ONLY): 28 min ? ?Charges:  $Therapeutic Activity: 23-37 mins          ?          ? ?Moishe Spice, PT, DPT ?Acute Rehabilitation Services  ?Pager: [phone] ?Office: [phone] ? ? ? ?Maretta Bees Pettis ?11/30/2021, 4:20 PM ? ?

## 2021-11-30 NOTE — Discharge Instructions (Addendum)
? ?[facility] ? ?Discharge instructions ? ?Lower Extremity Bypass Surgery ? ?Please refer to the following instruction for your post-procedure care. Your surgeon or physician assistant will discuss any changes with you. ? ?Activity ? ?You are encouraged to walk as much as you can. You can slowly return to normal activities during the month after your surgery. Avoid strenuous activity and heavy lifting until your doctor tells you it's OK. Avoid activities such as vacuuming or swinging a golf club. Do not drive until your doctor give the OK and you are no longer taking prescription pain medications. It is also normal to have difficulty with sleep habits, eating and bowel movement after surgery. These will go away with time. ? ?Bathing/Showering ? ?Shower daily after you go home. Do not soak in a bathtub, hot tub, or swim until the incision heals completely. ? ?Incision Care ? ?Clean your incision with mild soap and water. Shower every day. Pat the area dry with a clean towel. You do not need a bandage unless otherwise instructed. Do not apply any ointments or creams to your incision. If you have open wounds you will be instructed how to care for them or a visiting nurse may be arranged for you. If you have staples or sutures along your incision they will be removed at your post-op appointment. You may have skin glue on your incision. Do not peel it off. It will come off on its own in about one week. ? ?Wash the groin wound with soap and water daily and pat dry. (No tub bath-only shower)  Then put a dry gauze or washcloth in the groin to keep this area dry to help prevent wound infection.  Do this daily and as needed.  Do not use Vaseline or neosporin on your incisions.  Only use soap and water on your incisions and then protect and keep dry. ? ?Diet ? ?Resume your normal diet. There are no special food restrictions following this procedure. A low fat/ low cholesterol diet is  recommended for all patients with vascular disease. In order to heal from your surgery, it is CRITICAL to get adequate nutrition. Your body requires vitamins, minerals, and protein. Vegetables are the best source of vitamins and minerals. Vegetables also provide the perfect balance of protein. Processed food has little nutritional value, so try to avoid this. ? ?Medications ? ?Resume taking all your medications unless your doctor or physician assistant tells you not to. If your incision is causing pain, you may take over-the-counter pain relievers such as acetaminophen (Tylenol). If you were prescribed a stronger pain medication, please aware these medication can cause nausea and constipation. Prevent nausea by taking the medication with a snack or meal. Avoid constipation by drinking plenty of fluids and eating foods with high amount of fiber, such as fruits, vegetables, and grains. Take Colace 100 mg (an over-the-counter stool softener) twice a day as needed for constipation.  ?Do not take Tylenol if you are taking prescription pain medications. ? ?Follow Up ? ?Our office will schedule a follow up appointment 2-3 weeks following discharge. ? ?Please call us immediately for any of the following conditions ? ?Severe or worsening pain in your legs or feet while at rest or while walking Increase pain, redness, warmth, or drainage (pus) from your incision site(s) ?Fever of 101 degree or higher ?The swelling in your leg with the bypass suddenly worsens and becomes more painful than when you were in the hospital ?If you have  been instructed to feel your graft pulse then you should do so every day. If you can no longer feel this pulse, call the office immediately. Not all patients are given this instruction. ? ?Leg swelling is common after leg bypass surgery. ? ?The swelling should improve over a few months following surgery. To improve the swelling, you may elevate your legs above the level of your heart while you are  sitting or resting. Your surgeon or physician assistant may ask you to apply an ACE wrap or wear compression (TED) stockings to help to reduce swelling. ? ?Reduce your risk of vascular disease ? ?Stop smoking. If you would like help call [facility] at 1-800-QUIT-NOW [phone]) or [facility] at [phone]. ? ?Manage your cholesterol ?Maintain a desired weight ?Control your diabetes weight ?Control your diabetes ?Keep your blood pressure down ? ?If you have any questions, please call the office at [phone] ?------------------------------------------------------------------------- ? ?Information on my medicine - ELIQUIS? (apixaban) ? ?Why was Eliquis? prescribed for you? ?Eliquis? was prescribed to prevent blood clots in the veins of your legs (deep vein thrombosis) or in your lungs (pulmonary embolism). ? ?What do You need to know about Eliquis? ? ?The dose ONE 5 mg tablet taken TWICE daily.  Eliquis? may be taken with or without food.  ? ?Try to take the dose about the same time in the morning and in the evening. If you have difficulty swallowing the tablet whole please discuss with your pharmacist how to take the medication safely. ? ?Take Eliquis? exactly as prescribed and DO NOT stop taking Eliquis? without talking to the doctor who prescribed the medication.  Stopping may increase your risk of developing a new blood clot.  Refill your prescription before you run out. ? ?After discharge, you should have regular check-up appointments with your healthcare provider that is prescribing your Eliquis?. ?   ?What do you do if you miss a dose? ?If a dose of ELIQUIS? is not taken at the scheduled time, take it as soon as possible on the same day and twice-daily administration should be resumed. The dose should not be doubled to make up for a missed dose. ? ?Important Safety Information ?A possible side effect of Eliquis? is bleeding. You should call your healthcare provider right away if you experience any of  the following: ?Bleeding from an injury or your nose that does not stop. ?Unusual colored urine (red or dark brown) or unusual colored stools (red or black). ?Unusual bruising for unknown reasons. ?A serious fall or if you hit your head (even if there is no bleeding). ? ?Some medicines may interact with Eliquis? and might increase your risk of bleeding or clotting while on Eliquis?Marland Kitchen To help avoid this, consult your healthcare provider or pharmacist prior to using any new prescription or non-prescription medications, including herbals, vitamins, non-steroidal anti-inflammatory drugs (NSAIDs) and supplements. ? ?This website has more information on Eliquis? (apixaban): http://www.eliquis.com/eliquis/home  ? ?

## 2021-12-01 ENCOUNTER — Inpatient Hospital Stay (HOSPITAL_COMMUNITY): Payer: Medicare Other

## 2021-12-01 DIAGNOSIS — F419 Anxiety disorder, unspecified: Secondary | ICD-10-CM

## 2021-12-01 DIAGNOSIS — E440 Moderate protein-calorie malnutrition: Secondary | ICD-10-CM | POA: Insufficient documentation

## 2021-12-01 DIAGNOSIS — I2510 Atherosclerotic heart disease of native coronary artery without angina pectoris: Secondary | ICD-10-CM | POA: Diagnosis not present

## 2021-12-01 DIAGNOSIS — I70223 Atherosclerosis of native arteries of extremities with rest pain, bilateral legs: Secondary | ICD-10-CM | POA: Diagnosis not present

## 2021-12-01 DIAGNOSIS — J9611 Chronic respiratory failure with hypoxia: Secondary | ICD-10-CM | POA: Diagnosis not present

## 2021-12-01 DIAGNOSIS — F32A Depression, unspecified: Secondary | ICD-10-CM

## 2021-12-01 LAB — CBC
HCT: 23.7 % — ABNORMAL LOW (ref 36.0–46.0)
Hemoglobin: 7.1 g/dL — ABNORMAL LOW (ref 12.0–15.0)
MCH: 28.9 pg (ref 26.0–34.0)
MCHC: 30 g/dL (ref 30.0–36.0)
MCV: 96.3 fL (ref 80.0–100.0)
Platelets: 157 10*3/uL (ref 150–400)
RBC: 2.46 MIL/uL — ABNORMAL LOW (ref 3.87–5.11)
RDW: 16.8 % — ABNORMAL HIGH (ref 11.5–15.5)
WBC: 12 10*3/uL — ABNORMAL HIGH (ref 4.0–10.5)
nRBC: 0.2 % (ref 0.0–0.2)

## 2021-12-01 LAB — GLUCOSE, CAPILLARY
Glucose-Capillary: 105 mg/dL — ABNORMAL HIGH (ref 70–99)
Glucose-Capillary: 92 mg/dL (ref 70–99)
Glucose-Capillary: 86 mg/dL (ref 70–99)
Glucose-Capillary: 74 mg/dL (ref 70–99)

## 2021-12-01 LAB — BASIC METABOLIC PANEL
Anion gap: 9 (ref 5–15)
BUN: 19 mg/dL (ref 8–23)
CO2: 27 mmol/L (ref 22–32)
Calcium: 8 mg/dL — ABNORMAL LOW (ref 8.9–10.3)
Chloride: 102 mmol/L (ref 98–111)
Creatinine, Ser: 3.9 mg/dL — ABNORMAL HIGH (ref 0.44–1.00)
GFR, Estimated: 12 mL/min — ABNORMAL LOW (ref >60)
Glucose, Bld: 105 mg/dL — ABNORMAL HIGH (ref 70–99)
Potassium: 3.7 mmol/L (ref 3.5–5.1)
Sodium: 138 mmol/L (ref 135–145)

## 2021-12-01 LAB — MAGNESIUM: Magnesium: 1.9 mg/dL (ref 1.7–2.4)

## 2021-12-01 MED ORDER — DARBEPOETIN ALFA 150 MCG/0.3ML IJ SOSY: 150.0000 ug | PREFILLED_SYRINGE | INTRAMUSCULAR | Status: DC

## 2021-12-01 MED ORDER — SUCROFERRIC OXYHYDROXIDE 500 MG PO CHEW
1000.0000 mg | CHEWABLE_TABLET | Freq: Three times a day (TID) | ORAL | Status: DC
  Filled 2021-12-01 (×4): qty 2

## 2021-12-01 NOTE — Progress Notes (Signed)
Pt arrived from [facility]..  A&O x 3.  2 Daughters at bedside.  CHG bath given.  Vital signs.  CCMD notified.   ?C/o of 10/10 pain at R groin. Wound vac to R groin w/ o ml output.  Noted approx 5cm hematoma left of wound vac sponge.  ?No pain meds at time, noted drowsy.  R 9-14.  Will continue to evaluate.  ?Reported difficulty swallowing medications the am.  Mouth care with toothbrush, rinse and moisturizer.  Patient tolerated ice chips and ice water without difficulty.   ? ?

## 2021-12-01 NOTE — Progress Notes (Signed)
Went to give pt her medication. Pt was not able to swallow the medications. Dr. Tana Coast notified and new diet orders placed.  ?

## 2021-12-01 NOTE — Progress Notes (Signed)
When offering pt scheduled nighttime meds, pt politely declined them and stated she only wanted her pain meds. Daughter in room confirmed "my mother is able to make her own decisions". Held all scheduled meds. Given PRN fentanyl for 10/10 surgical pain. Pt is currently drowsy but easily arouses to voice.  ?

## 2021-12-01 NOTE — Progress Notes (Signed)
?[facility] ?Progress Note  ? ?Subjective:  Seen in room - daughter at bedside. Had rough night last night d/t pain - drowsy this AM. Denies CP or dyspnea above her usual baseline, remains on nasal O2. Per RN, will be transferring to CIR/rehab later today. ? ?Objective ?Vitals:  ? 12/01/21 0001 12/01/21 0359 12/01/21 0744 12/01/21 0834  ?BP: (!) 165/52 (!) 100/30 (!) 162/89   ?Pulse: 70 70 69   ?Resp: 18 18 16    ?Temp: 98.5 ?F (36.9 ?C) 98.8 ?F (37.1 ?C) 99 ?F (37.2 ?C)   ?TempSrc: Oral Oral    ?SpO2: 97% 97% 99% 99%  ?Weight:      ?Height:      ? ?Physical Exam ?General: Chronically ill appearing woman, drowsy. On nasal O2. ?Heart: RRR; 2/6 murmur ?Lungs: CTAB, but with reduced respiratory effort - drowsy ?Abdomen: soft ?Extremities: No LE edema ?Dialysis Access: L AVF + bruit ? ?Additional Objective ?Labs: ?Basic Metabolic Panel: ?Recent Labs  ?Lab 11/28/21 ?0230 11/28/21 ?1401 11/29/21 ?1613 11/30/21 ?1355 12/01/21 ?0321  ?NA 137   < > 137 135 138  ?K 4.2   < > 3.9 4.5 3.7  ?CL 98   < > 101 99 102  ?CO2 27   < > 29 25 27   ?GLUCOSE 125*   < > 111* 116* 105*  ?BUN 48*   < > 23 36* 19  ?CREATININE 6.66*   < > 4.14* 5.80* 3.90*  ?CALCIUM 8.9   < > 8.2* 8.3* 8.0*  ?PHOS 6.2*  --  4.6 7.6*  --   ? < > = values in this interval not displayed.  ? ?Liver Function Tests: ?Recent Labs  ?Lab 11/28/21 ?0230 11/29/21 ?1613 11/30/21 ?1355  ?ALBUMIN 2.7* 3.0* 2.6*  ? ?CBC: ?Recent Labs  ?Lab 11/29/21 ?0439 11/29/21 ?1613 11/30/21 ?3825 11/30/21 ?1355 12/01/21 ?0321  ?WBC 14.8* 11.8* 13.0* 14.4* 12.0*  ?HGB 9.8* 9.9* 8.4* 8.0* 7.1*  ?HCT 32.0* 31.8* 28.2* 26.1* 23.7*  ?MCV 93.3 92.2 93.7 94.6 96.3  ?PLT 192 183 177 183 157  ? ?Medications: ? sodium chloride    ? sodium chloride    ? sodium chloride    ? sodium chloride    ? sodium chloride    ? sodium chloride    ? sodium chloride    ? iron sucrose    ? magnesium sulfate bolus IVPB    ? ? apixaban  5 mg Oral BID  ? arformoterol  15 mcg Nebulization BID  ? aspirin   81 mg Oral Daily  ? atorvastatin  80 mg Oral q1800  ? budesonide (PULMICORT) nebulizer solution  0.25 mg Nebulization BID  ? carvedilol  12.5 mg Oral QHS  ? And  ? carvedilol  12.5 mg Oral Once per day on Sun Tue Thu  ? Chlorhexidine Gluconate Cloth  6 each Topical Daily  ? darbepoetin (ARANESP) injection - DIALYSIS  25 mcg Intravenous Q Mon-HD  ? docusate sodium  100 mg Oral Daily  ? doxercalciferol  1 mcg Intravenous Q M,W,F-HD  ? feeding supplement (NEPRO CARB STEADY)  237 mL Oral TID BM  ? FLUoxetine  20 mg Oral QHS  ? gabapentin  200 mg Oral QHS  ? insulin aspart  0-6 Units Subcutaneous TID WC  ? losartan  100 mg Oral Daily  ? multivitamin  1 tablet Oral QHS  ? pantoprazole  40 mg Oral Daily  ? revefenacin  175 mcg Nebulization Daily  ? sodium chloride flush  3 mL Intravenous Q12H  ? sodium chloride flush  3 mL Intravenous Q12H  ? sucroferric oxyhydroxide  500 mg Oral TID WC  ? ? ?Dialysis Orders: ?MWFSa at AF ?3hr, 400/500, 3K/2.5Ca, EDW 75kg, UFP #2, heparin 5000u bolus + 2000u mid-run ?- Hectoral 24mcg IV q HD ?- Venofer 50mg  IV weekly ?- Mircera 24mcg IV q 2 weeks (last given 2/28 - had missed prior dose) ? ?Assessment/Plan: ?Bilateral LE critical limb ischemia: Followed by [facility]. Needs staged bilateral femoral endarterectomies/bypass. S/p RIGHT external iliac, common femoral and extensive right profunda femoris endarterectomy, RIGHT popliteal and tibioperoneal trunk endarterectomy and bovine patch angioplasty, RIGHT common femoral to tibioperoneal trunk bypass with 46mm ePTFE on 3/22 by Dr. Stanford Breed. LEFT side to be done in near future. ?ESRD: 4d/ week as outpatient -> follow MWF schedule while here for now. Next Monday 3/27. ?Hypertension/volume: BP variable, continue with same EDW for now. ?Anemia of ESRD: Hgb 9.2 -> 7 post-operatively. S/p 2 units PRBCs 11/28/21. Continue Aranesp weekly (Monday) - will increase next dose. ?Metabolic bone disease: Ca ok, Phos high - continue binders, but ^ dose, continue  VDRA.  ?HFrEF (last EF 30-35% with G1DD 11/24/2021): Optimize volume with HD. Reminded patient to adhere to fluid restrictions.  ?COPD/tobacco abuse-Meds per primary ?DM type II -Per primary.  ?Nutrition: Continue protein supplements for low alb. Dislikes Nepro, try Pro-source.  ?History of anxiety/depression: Per primary. ?Debility: To CIR as of 3/25 per notes. ? ?Veneta Penton, PA-C ?12/01/2021, 9:58 AM  ?[facility] ? ? ? ?

## 2021-12-01 NOTE — Progress Notes (Signed)
Called to give report to 4E RN not successful ? ?

## 2021-12-01 NOTE — Evaluation (Signed)
Clinical/Bedside Swallow Evaluation ?Patient Details  ?Name: Tina Gomez ?MRN: [id number] ?Date of Birth: 1956-01-11 ? ?Today's Date: 12/01/2021 ?Time: SLP Start Time (ACUTE ONLY): 1440 SLP Stop Time (ACUTE ONLY): 1500 ?SLP Time Calculation (min) (ACUTE ONLY): 20 min ? ?Past Medical History:  ?Past Medical History:  ?Diagnosis Date  ? Anemia   ? CAD (coronary artery disease)   ? Chronic respiratory failure with hypoxia (HCC)   ? COPD (chronic obstructive pulmonary disease) ([place]) 05/2019  ? no inhaler  ? Diabetes mellitus without complication ([place])   ? no meds - diet controlled  ? ESRD on hemodialysis ([place])   ? History of blood transfusion 09/2020  ? 1 unit  ? HLD (hyperlipidemia)   ? Hypertension   ? Peripheral vascular disease ([place])   ? PONV (postoperative nausea and vomiting)   ? Restless legs syndrome (RLS)   ? Stroke [facility])   ? ?Past Surgical History:  ?Past Surgical History:  ?Procedure Laterality Date  ? AV FISTULA PLACEMENT Left 07/27/2020  ? Procedure: LEFT BRACHIO-BASILIC ARTERIOVENOUS (AV) FISTULA CREATION;  Surgeon: Cherre Robins, MD;  Location: [place];  Service: Vascular;  Laterality: Left;  ? BASCILIC VEIN TRANSPOSITION Left 11/02/2020  ? Procedure: LEFT UPPER ARM SECOND STAGE BASILIC VEIN TRANSPOSITION;  Surgeon: Cherre Robins, MD;  Location: [facility];  Service: Vascular;  Laterality: Left;  PERIPHERAL NERVE BLOCK  ? CESAREAN SECTION    ? x 1  ? ENDARTERECTOMY FEMORAL Right 11/28/2021  ? Procedure: ENDARTERECTOMY FEMORAL;  Surgeon: Cherre Robins, MD;  Location: [facility] OR;  Service: Vascular;  Laterality: Right;  ? ESOPHAGOGASTRODUODENOSCOPY (EGD) WITH PROPOFOL N/A 05/26/2020  ? Procedure: ESOPHAGOGASTRODUODENOSCOPY (EGD) WITH PROPOFOL;  Surgeon: Irene Shipper, MD;  Location: [facility] ENDOSCOPY;  Service: Endoscopy;  Laterality: N/A;  ? FEMORAL-POPLITEAL BYPASS GRAFT Right 11/28/2021  ? Procedure: RIGHT FEMORAL-POPLITEAL ARTERY BYPASS WITH 6MM REMOVABLE RING GORE GRAFT WITH EXTENDED PERFUNDAPLASTY;  Surgeon:  Cherre Robins, MD;  Location: [place];  Service: Vascular;  Laterality: Right;  ? INSERTION OF DIALYSIS CATHETER Right 07/27/2020  ? Procedure: INSERTION OF RIGHT INTERNAL JUGULAR TUNNELED DIALYSIS CATHETER;  Surgeon: Cherre Robins, MD;  Location: [place];  Service: Vascular;  Laterality: Right;  ? INTRAOPERATIVE ARTERIOGRAM Right 11/28/2021  ? Procedure: INTRA OPERATIVE ARTERIOGRAM;  Surgeon: Cherre Robins, MD;  Location: [place];  Service: Vascular;  Laterality: Right;  ? IR FLUORO GUIDE CV LINE RIGHT  07/25/2020  ? IR US GUIDE VASC ACCESS RIGHT  07/25/2020  ? LEFT HEART CATH AND CORONARY ANGIOGRAPHY N/A 11/28/2020  ? Procedure: LEFT HEART CATH AND CORONARY ANGIOGRAPHY;  Surgeon: Troy Sine, MD;  Location: [place] CV LAB;  Service: Cardiovascular;  Laterality: N/A;  ? LOWER EXTREMITY ANGIOGRAPHY N/A 11/23/2021  ? Procedure: Lower Extremity Angiography;  Surgeon: Cherre Robins, MD;  Location: [place] CV LAB;  Service: Cardiovascular;  Laterality: N/A;  ? PATCH ANGIOPLASTY Right 11/28/2021  ? Procedure: PATCH ANGIOPLASTY USING BOVINE PERICARDIUM Weyman Pedro;  Surgeon: Cherre Robins, MD;  Location: [facility] OR;  Service: Vascular;  Laterality: Right;  ? TONSILLECTOMY    ? UPPER GI ENDOSCOPY    ? growth removed from voice box  ? WRIST SURGERY Left   ? ganglion cyst removal  ? ?HPI:  ?Patient is a 66 y.o. female with PMH: ESRD on HD, COPD chronic respiratory failure with hypoxia on 3-4L supplemental oxygen, h/o CVA, CAD, diet controlled DM-2, HTN, HLD, depression/anxiety, tobacco use who presented to the ED for  evaluation of lower limb ischemia. She had significant nausea evening of 3/24 and MD changed her diet to Dys 3 solids ,thin liquids and ordered SLP evaluation of swallow. CXR showed Mild pulmonary edema.  ?  ?Assessment / Plan / Recommendation  ?Clinical Impression ? Patient presents with clinical s/s of dysphagia as per this limited bedside/clinical swallow evaluation. Patient currently on pain  medications secondary to surgery and per daughters who were present in room, she is still with some pain. Patient was very lethargic when SLP entered room but she did wake up somewhat when [facility] elevated. She allowed for SLP to remove her top dentures and perform oral care. She accepted one very small ice chip which she did respond to by moving around in mouth and initiating a swallow. She started to become too sleepy to trial any more PO's. Per daughters, patient does have history of some coughing during PO intake. Currently, patient's lethargy is impacting her ability to safely consume PO's but currently ability to determine her true swallow function is impacted by her lethargy. SLP recommending to continue with current diet, feed only when alert, and stop if excessive coughing. SLP will f/u next 1-2 dates to ensure diet toleration. ?SLP Visit Diagnosis: Dysphagia, unspecified (R13.10) ?   ?Aspiration Risk ? Mild aspiration risk  ?  ?Diet Recommendation Dysphagia 3 (Mech soft);Thin liquid  ? ?Liquid Administration via: Straw;Cup ?Medication Administration: Whole meds with puree ?Supervision: Full supervision/cueing for compensatory strategies ?Compensations: Slow rate;Small sips/bites ?Postural Changes: Seated upright at 90 degrees  ?  ?Other  Recommendations Oral Care Recommendations: Oral care BID;Staff/trained caregiver to provide oral care   ? ?Recommendations for follow up therapy are one component of a multi-disciplinary discharge planning process, led by the attending physician.  Recommendations may be updated based on patient status, additional functional criteria and insurance authorization. ? ?Follow up Recommendations Other (comment) (TBD)  ? ? ?  ?Assistance Recommended at Discharge Frequent or constant Supervision/Assistance  ?Functional Status Assessment Patient has had a recent decline in their functional status and demonstrates the ability to make significant improvements in function in a reasonable  and predictable amount of time.  ?Frequency and Duration min 1 x/week  ?1 week ?  ?   ? ?Prognosis Prognosis for Safe Diet Advancement: Good  ? ?  ? ?Swallow Study   ?General Date of Onset: 11/30/21 ?HPI: Patient is a 66 y.o. female with PMH: ESRD on HD, COPD chronic respiratory failure with hypoxia on 3-4L supplemental oxygen, h/o CVA, CAD, diet controlled DM-2, HTN, HLD, depression/anxiety, tobacco use who presented to the ED for evaluation of lower limb ischemia. She had significant nausea evening of 3/24 and MD changed her diet to Dys 3 solids ,thin liquids and ordered SLP evaluation of swallow. CXR showed Mild pulmonary edema. ?Type of Study: Bedside Swallow Evaluation ?Previous Swallow Assessment: none found ?Diet Prior to this Study: Dysphagia 3 (soft);Thin liquids ?Temperature Spikes Noted: No ?Respiratory Status: Nasal cannula ?History of Recent Intubation: No ?Behavior/Cognition: Lethargic/Drowsy ?Oral Cavity Assessment: Dry ?Oral Care Completed by SLP: Yes ?Oral Cavity - Dentition: Dentures, bottom;Dentures, top ?Self-Feeding Abilities: Total assist ?Baseline Vocal Quality: Not observed ?Volitional Cough: Cognitively unable to elicit ?Volitional Swallow: Unable to elicit  ?  ?Oral/Motor/Sensory Function Overall Oral Motor/Sensory Function: Other (comment) (patient very lethargic and unable to fully participate but no observed assymetry or facial weakness observed)   ?Ice Chips Ice chips: Impaired ?Pharyngeal Phase Impairments: Suspected delayed Swallow   ?Thin Liquid Thin Liquid: Not tested  ?  ?  Nectar Thick Nectar Thick Liquid: Not tested   ?Honey Thick Honey Thick Liquid: Not tested   ?Puree Puree: Not tested   ?Solid ? ? ?  Solid: Not tested  ? ?  ?Sonia Baller, MA, CCC-SLP ?Speech Therapy ? ? ? ?

## 2021-12-01 NOTE — Assessment & Plan Note (Addendum)
Full comfort care ?

## 2021-12-01 NOTE — Progress Notes (Signed)
Called to give report to 4E she will call me back ? ?

## 2021-12-01 NOTE — Progress Notes (Addendum)
?  Progress Note ? ? ? ?12/01/2021 ?11:19 AM ?3 Days Post-Op ? ?Subjective:  sleepy this morning, pain improved from overnight ? ?afebrile ? ?Vitals:  ? 12/01/21 0834 12/01/21 1044  ?BP:  (!) 160/52  ?Pulse:  70  ?Resp:  15  ?Temp:  98.4 ?F (36.9 ?C)  ?SpO2: 99% 96%  ? ? ?Physical Exam: ?General:  sleeping; awakes to voice and follows commands and answers questions ?Lungs:  non labored ?Incisions:  right groin with Prevena vac with good seal and right Below knee incision looks good.  ?Extremities:  +right DP and PT doppler signals.  Right foot is warm,  ? ? ?CBC ?   ?Component Value Date/Time  ? WBC 12.0 (H) 12/01/2021 0321  ? RBC 2.46 (L) 12/01/2021 0321  ? HGB 7.1 (L) 12/01/2021 0321  ? HGB 9.5 (L) 02/06/2021 0867  ? HCT 23.7 (L) 12/01/2021 0321  ? HCT 31.3 (L) 02/06/2021 6195  ? PLT 157 12/01/2021 0321  ? PLT 299 02/06/2021 0927  ? MCV 96.3 12/01/2021 0321  ? MCV 93 02/06/2021 0927  ? MCH 28.9 12/01/2021 0321  ? MCHC 30.0 12/01/2021 0321  ? RDW 16.8 (H) 12/01/2021 0321  ? RDW 15.8 (H) 02/06/2021 0932  ? LYMPHSABS 1.5 11/21/2021 1842  ? LYMPHSABS 1.6 02/06/2021 0927  ? MONOABS 0.6 11/21/2021 1842  ? EOSABS 0.1 11/21/2021 1842  ? EOSABS 0.2 02/06/2021 0927  ? BASOSABS 0.0 11/21/2021 1842  ? BASOSABS 0.0 02/06/2021 6712  ? ? ?BMET ?   ?Component Value Date/Time  ? NA 138 12/01/2021 0321  ? NA 141 11/24/2020 1304  ? K 3.7 12/01/2021 0321  ? CL 102 12/01/2021 0321  ? CO2 27 12/01/2021 0321  ? GLUCOSE 105 (H) 12/01/2021 0321  ? BUN 19 12/01/2021 0321  ? BUN 47 (H) 11/24/2020 1304  ? CREATININE 3.90 (H) 12/01/2021 0321  ? CREATININE 1.25 (H) 11/13/2016 1018  ? CALCIUM 8.0 (L) 12/01/2021 0321  ? CALCIUM 5.8 (LL) 07/24/2020 0343  ? GFRNONAA 12 (L) 12/01/2021 0321  ? GFRNONAA 47 (L) 11/13/2016 1018  ? GFRAA 13 (L) 06/15/2020 1134  ? GFRAA 54 (L) 11/13/2016 1018  ? ? ?INR ?   ?Component Value Date/Time  ? INR 1.2 11/21/2021 2010  ? ? ? ?Intake/Output Summary (Last 24 hours) at 12/01/2021 1119 ?Last data filed at 12/01/2021  4580 ?Gross per 24 hour  ?Intake 123 ml  ?Output 800 ml  ?Net -677 ml  ? ? ? ? ?Assessment/Plan:  66 y.o. female is s/p:  ?1) Right external iliac, common femoral and extensive right profunda femoris endarterectomy (second order branches of profunda) and bovine patch angioplasty ?2) Right popliteal and tibioperoneal trunk endarterectomy and bovine patch angioplasty ?3) Right common femoral to tibioperoneal trunk bypass with 61mm ePTFE ?4) Right lower extremity angiogram   ?3 Days Post-Op ? ? ?- Continues to have +doppler signals right DP/PT ?-PT/OT recommending CIR.  Continue mobilizing out of bed.  ?- incisions healing appropriately ?-Will f/u with Dr. Stanford Breed in 2-3 weeks in the office for further discussions for LLE bypass.  The office will arrange this appt.  ?- Please call if questions or concerns arise.  ?- Pt ready for CIR from vascular perspective  ? ? ?Tina Gomez  ?[facility] ?[phone] ?12/01/2021 ?11:19 AM ? ? ? ? ? ?

## 2021-12-01 NOTE — Progress Notes (Signed)
? ? ? [facility] ?                                                                            ? ? ?Tina Gomez, is a 66 y.o. female, DOB - 1955-11-04, HER:[id number] ?Admit date - 11/21/2021    ?Outpatient Primary MD for the patient is Charlott Rakes, MD ? ?LOS - 10  days ? ? ? ?Brief summary  ? ?Tina Gomez is a 66 y.o. female with medical history significant for ESRD on MWFSa HD, COPD, chronic respiratory failure with hypoxia on 3-4 L supplemental O2 via [place], history of CVA, CAD, HFrEF (EF 30% 11/08/2020), diet-controlled T2DM, HTN, HLD, depression/anxiety, tobacco use who is admitted with bilateral lower limb ischemia.  Started on IV heparin and seen by vascular surgery. Aortogram performed with lower extremity runoff confirms severe PAD bilaterally. Recommendations have included strong consideration for transition to hospice care given her elevated risk of mortality/morbidity with operative management. The patient has been clear that she does not desire transition to comfort care nor amputation, but wants to proceed with revascularization. Cardiology has provided risk assessment with no recommendations for further work up.  ? ?S/p endarterectomy and right fem-pop bypasses for critical limb ischemia. ? ?3/25: Patient transferred from [place], [place] assumed care ? ?PT recommended CIR. ? ? ?Assessment & Plan  ? ? ?Assessment and Plan: ?* Critical limb ischemia of both lower extremities ([place]) ?-Vascular surgery, Dr. Stanford Breed, performed aortogram/bilateral lower extremity runoff 3/17 revealing severe PAD, bilateral lower extremity critical limb ischemia.  ?-Underwent endarterectomy and right femoropopliteal bypass on 3/22.  Postprocedure was hypotensive, went to ICU.  Vasopressors now off. ?- Continue aspirin and atorvastatin 80mg . ?- Pain is uncontrolled.  Currently on IV fentanyl 50-100 mcg q 2 hours as needed, preferred for ESRD patients  ?-Patient was seen by palliative during hospitalization ? ?ESRD  on hemodialysis ([place]) ?- Patient on hemodialysis MWF, nephrology following ? ?Chronic systolic CHF (congestive heart failure) ([place]) ?-Stable without evidence of volume overload.  Last EF 30% 11/08/2020. Stable on recheck 11/24/2021. ?- Continue Coreg, losartan.  ?- Volume managed with dialysis. ? ?COPD (chronic obstructive pulmonary disease) ([place]) ?-Continue Spiriva and albuterol as needed ? ?Chronic respiratory failure with hypoxia (HCC) ?-Remains at baseline. ? ?Essential hypertension, benign ?-Continue Coreg, losartan ? ?Type 2 diabetes mellitus with diabetic neuropathy, unspecified ([place]) ?- Continue sensitive sliding scale insulin ? ?CAD (coronary artery disease) ?-Stable. No new WMA on echo. Recent cath with 70% RCA stenosis managed medically without other obstructive disease. No anginal complaints since that time.  ?- Continue aspirin, coreg, and atorvastatin. ?- No further work up currently recommended per cardiology pre-operative risk assessment consultation on 3/19.  ? ?Hyperlipidemia ?LDL is 102.  ?-Continue Lipitor ? ?Malnutrition of moderate degree ?- Patient had significant nausea last night, will change diet to dysphagia 3, SLP evaluation ? ?Anxiety and depression ?Continue Prozac and hydroxyzine as needed. ? ? ? ?Code Status: Full CODE STATUS ?DVT Prophylaxis:  SCD's Start: 11/28/21 1536 ?SCD's Start: 11/28/21 1535 ?apixaban (ELIQUIS) tablet 5 mg  ? ?Level of Care: Level of care: Progressive ?Family Communication: Updated patient's daughter at the bedside ? ?Disposition Plan:     Remains  inpatient appropriate: Needs CIR, still in significant pain ? ?Procedures:  ?3/22:endarterectomy of the right external iliac, common femoral, right profunda femoris, right popliteal and tibial peroneal arteries as well as a femoral to tibial peroneal trunk bypass.   ? ?Consultants:   ?PCCM ?Vascular surgery ?Palliative care ? ?Antimicrobials:  ?None ? ? ?Medications ? ? apixaban  5 mg Oral BID  ? arformoterol  15 mcg  Nebulization BID  ? aspirin  81 mg Oral Daily  ? atorvastatin  80 mg Oral q1800  ? budesonide (PULMICORT) nebulizer solution  0.25 mg Nebulization BID  ? carvedilol  12.5 mg Oral QHS  ? And  ? carvedilol  12.5 mg Oral Once per day on Sun Tue Thu  ? Chlorhexidine Gluconate Cloth  6 each Topical Daily  ? [START ON 12/03/2021] darbepoetin (ARANESP) injection - DIALYSIS  150 mcg Intravenous Q Mon-HD  ? docusate sodium  100 mg Oral Daily  ? doxercalciferol  1 mcg Intravenous Q M,W,F-HD  ? FLUoxetine  20 mg Oral QHS  ? gabapentin  200 mg Oral QHS  ? insulin aspart  0-6 Units Subcutaneous TID WC  ? losartan  100 mg Oral Daily  ? multivitamin  1 tablet Oral QHS  ? pantoprazole  40 mg Oral Daily  ? revefenacin  175 mcg Nebulization Daily  ? sodium chloride flush  3 mL Intravenous Q12H  ? sodium chloride flush  3 mL Intravenous Q12H  ? sucroferric oxyhydroxide  1,000 mg Oral TID WC  ? ? ? ?Subjective:  ? ?Tina Gomez was seen and examined today.  Per patient and daughter at the bedside, had a rough night with nausea and intractable pain 10/10, did not sleep well.  No fevers or chills. ? ?Objective:  ? ?Vitals:  ? 12/01/21 0001 12/01/21 0359 12/01/21 0744 12/01/21 0834  ?BP: (!) 165/52 (!) 100/30 (!) 162/89   ?Pulse: 70 70 69   ?Resp: 18 18 16    ?Temp: 98.5 ?F (36.9 ?C) 98.8 ?F (37.1 ?C) 99 ?F (37.2 ?C)   ?TempSrc: Oral Oral    ?SpO2: 97% 97% 99% 99%  ?Weight:      ?Height:      ? ? ?Intake/Output Summary (Last 24 hours) at 12/01/2021 1029 ?Last data filed at 12/01/2021 7425 ?Gross per 24 hour  ?Intake 134.5 ml  ?Output 800 ml  ?Net -665.5 ml  ? ?Filed Weights  ? 11/30/21 0500 11/30/21 1520 11/30/21 1845  ?Weight: 81.9 kg 77.9 kg 77.1 kg  ? ? ? ?Exam ?General: Alert and oriented x 3, NAD, uncomfortable, ill-appearing ?Cardiovascular: S1 S2 auscultated, no murmurs, RRR ?Respiratory: Diminished breath sound at the bases ?Gastrointestinal: Soft, nontender, nondistended, + bowel sounds ?Ext: no pedal edema bilaterally ?Neuro: able  to lift up her legs however feels uncomfortable and tearful with pain ?Skin: RLE warm and perfused. ?Psych: uncomfortable and anxious ? ? ?Data Reviewed:  I have personally reviewed following labs  ? ? ?CBC ?Lab Results  ?Component Value Date  ? WBC 12.0 (H) 12/01/2021  ? RBC 2.46 (L) 12/01/2021  ? HGB 7.1 (L) 12/01/2021  ? HCT 23.7 (L) 12/01/2021  ? MCV 96.3 12/01/2021  ? MCH 28.9 12/01/2021  ? PLT 157 12/01/2021  ? MCHC 30.0 12/01/2021  ? RDW 16.8 (H) 12/01/2021  ? LYMPHSABS 1.5 11/21/2021  ? MONOABS 0.6 11/21/2021  ? EOSABS 0.1 11/21/2021  ? BASOSABS 0.0 11/21/2021  ? ? ? ?Last metabolic panel ?Lab Results  ?Component Value Date  ? NA 138 12/01/2021  ?  K 3.7 12/01/2021  ? CL 102 12/01/2021  ? CO2 27 12/01/2021  ? BUN 19 12/01/2021  ? CREATININE 3.90 (H) 12/01/2021  ? GLUCOSE 105 (H) 12/01/2021  ? GFRNONAA 12 (L) 12/01/2021  ? GFRAA 13 (L) 06/15/2020  ? CALCIUM 8.0 (L) 12/01/2021  ? PHOS 7.6 (H) 11/30/2021  ? PROT 7.1 11/21/2021  ? ALBUMIN 2.6 (L) 11/30/2021  ? LABGLOB 2.8 06/15/2020  ? AGRATIO 1.0 (L) 06/15/2020  ? BILITOT 0.7 11/21/2021  ? ALKPHOS 73 11/21/2021  ? AST 18 11/21/2021  ? ALT 10 11/21/2021  ? ANIONGAP 9 12/01/2021  ? ? ?CBG (last 3)  ?Recent Labs  ?  11/30/21 ?1154 11/30/21 ?2029 12/01/21 ?0742  ?GLUCAP 129* 121* 105*  ?  ? ? ? ?Estill Cotta M.D. ?[facility] ?12/01/2021, 10:29 AM ? ?Available via Epic secure chat 7am-7pm ?After 7 pm, please refer to night coverage provider listed on amion. ? ?  ?

## 2021-12-02 DIAGNOSIS — J410 Simple chronic bronchitis: Secondary | ICD-10-CM

## 2021-12-02 DIAGNOSIS — I10 Essential (primary) hypertension: Secondary | ICD-10-CM

## 2021-12-02 DIAGNOSIS — I2510 Atherosclerotic heart disease of native coronary artery without angina pectoris: Secondary | ICD-10-CM | POA: Diagnosis not present

## 2021-12-02 DIAGNOSIS — I70223 Atherosclerosis of native arteries of extremities with rest pain, bilateral legs: Secondary | ICD-10-CM | POA: Diagnosis not present

## 2021-12-02 DIAGNOSIS — F419 Anxiety disorder, unspecified: Secondary | ICD-10-CM | POA: Diagnosis not present

## 2021-12-02 LAB — GLUCOSE, CAPILLARY: Glucose-Capillary: 74 mg/dL (ref 70–99)

## 2021-12-02 MED ORDER — SENNOSIDES-DOCUSATE SODIUM 8.6-50 MG PO TABS
1.0000 | ORAL_TABLET | Freq: Two times a day (BID) | ORAL | Status: DC
  Administered 2021-12-03: 1 via ORAL
  Filled 2021-12-02 (×3): qty 1

## 2021-12-02 MED ORDER — LORAZEPAM 2 MG/ML PO CONC: 1.0000 mg | ORAL | Status: DC | PRN

## 2021-12-02 MED ORDER — GLYCOPYRROLATE 0.2 MG/ML IJ SOLN: 0.2000 mg | INTRAMUSCULAR | Status: DC | PRN

## 2021-12-02 MED ORDER — LORAZEPAM 1 MG PO TABS: 1.0000 mg | ORAL_TABLET | ORAL | Status: DC | PRN

## 2021-12-02 MED ORDER — LORAZEPAM 2 MG/ML IJ SOLN: 1.0000 mg | INTRAMUSCULAR | Status: DC | PRN

## 2021-12-02 MED ORDER — GLYCOPYRROLATE 1 MG PO TABS
1.0000 mg | ORAL_TABLET | ORAL | Status: DC | PRN
  Filled 2021-12-02: qty 1

## 2021-12-02 MED ORDER — GLYCOPYRROLATE 0.2 MG/ML IJ SOLN
0.2000 mg | INTRAMUSCULAR | Status: DC | PRN
  Administered 2021-12-03: 0.2 mg via INTRAVENOUS
  Filled 2021-12-02: qty 1

## 2021-12-02 NOTE — Progress Notes (Signed)
Engineer, maintenance [facility]) Hospital Liaison note.  ? ?Received request from [place] for family interest in [facility]. Patient chart is under review. Spoke to daughter at bedside and assessed patient. [facility] will be in contact once a bed becomes available. ? ?Thank you for this referral. ?Clementeen Hoof, BSN, RN ?Hospital Liaison ?[phone] ?

## 2021-12-02 NOTE — Progress Notes (Signed)
Report given to receiving nurse at [facility]. All questions answered. ?

## 2021-12-02 NOTE — Progress Notes (Signed)
? ? ? ? ?  Patient's daughter in room ?The patient and her family have decided to move to full comfort care, DNR/DNI, stopping hemodialysis.  ? ? Wound vac removed from right groin for comfort ? ?No acute distress currently ? ? ?Roxy Horseman ?PA-C ?[facility] ?

## 2021-12-02 NOTE — Plan of Care (Signed)
?  I met with the patient, Tina Gomez and her daughter, Tina Gomez at the bedside.  I addressed goals of care this morning.  Patient and daughter has decided for full comfort care, DNR/DNI, stopping hemodialysis.  They are interested in residential hospice, [facility].   ? ?All the consulting teams (neurology, vascular surgery, palliative medicine) and TOC  notified.  I will add the comfort care orders. Full note to follow.  ? ? ? ?Estill Cotta M.D.  ?[facility] ?12/02/2021, 8:18 AM ? ? ?  ?

## 2021-12-02 NOTE — Progress Notes (Signed)
Notified by her hospitalist this morning that she is transitioning to comfort care/hospice. Will sign off. Appreciate all of her care teams. Will inform her outpatient dialysis unit. ? ?Veneta Penton, PA-C ?[facility] ?Pager [phone] ? ?

## 2021-12-02 NOTE — Progress Notes (Signed)
Pt transferred from [facility] [place] this pm for comfort care measures. Family at bedside ?

## 2021-12-02 NOTE — Progress Notes (Signed)
?  Daily Progress Note ? ?Seen and examined this morning.  Tina Gomez and her family elected to pursue comfort measures.  She has declined further dialysis. ? ?On exam, she was resting comfortably in bed.  I had a nice chat with her daughter, and granddaughter, Tina Gomez, who are all at bedside. ? ?I will update Dr. Stanford Breed regarding the above.  I asked her daughter to call with any questions or concerns, as we are happy to help her through this challenging time. ? ?J. Melene Muller MD MS ?[facility] ?[phone] ?12/02/2021  ?10:29 AM ? ?

## 2021-12-02 NOTE — Progress Notes (Signed)
? ? ? [facility] ?                                                                            ? ? ?Tina Gomez, is a 66 y.o. female, DOB - 01/25/1956, BHA:[id number] ?Admit date - 11/21/2021    ?Outpatient Primary MD for the patient is Charlott Rakes, MD ? ?LOS - 11  days ? ? ? ?Brief summary  ? ?Tina Gomez is a 66 y.o. 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  Started on IV heparin and seen by vascular surgery. Aortogram performed with lower extremity runoff confirms severe PAD bilaterally. Recommendations have included strong consideration for transition to hospice care given her elevated risk of mortality/morbidity with operative management. The patient has been clear that she does not desire transition to comfort care nor amputation, but wants to proceed with revascularization. Cardiology has provided risk assessment with no recommendations for further work up.  ? ?S/p endarterectomy and right fem-pop bypasses for critical limb ischemia. ? ?3/25: Patient transferred from [place], [place] assumed care ? ?PT recommended CIR. ?3/26: I addressed goals of care with the patient and her daughter at the bedside, patient decided for full comfort care and to stop hemodialysis.  They are interested in residential hospice, [facility]. ? ? ?Assessment & Plan  ? ? ?Assessment and Plan: ?* Critical limb ischemia of both lower extremities ([place]) ?-Vascular surgery, Dr. Stanford Breed, performed aortogram/bilateral lower extremity runoff 3/17 revealing severe PAD, bilateral lower extremity critical limb ischemia.  ?-Underwent endarterectomy and right femoropopliteal bypass on 3/22.  Postprocedure was hypotensive, went to ICU.  Vasopressors now off. ?-Pain better  controlled with IV Dilaudid ?-Goals of care addressed this morning, see below, placed on comfort care status ? ?Goals of care, counseling/discussion ?- I met with patient's her daughter [name] at the bedside and addressed goals of care.  Pain somewhat better controlled with IV Dilaudid although at this point patient and her family decided to pursue full comfort care and discontinue hemodialysis.  Discussed about hospice and they prefer residential hospice, [facility].  Vascular surgery, nephrology, palliative care team and TOC was notified ?-All unnecessary medications, labs, orders discontinued, only medications for comfort care added. ?-Will transfer to palliative floor ? ?ESRD on hemodialysis ([place]) ?- Patient on hemodialysis MWF ?-Goals of care addressed this morning, with the patient and daughter [name] at the bedside.  Patient has decided to stop hemodialysis and pursue full comfort care status, residential hospice. ?-Nephrology notified. ? ?Chronic systolic CHF (congestive heart failure) ([place]) ?-Stable without evidence of volume overload.  Last EF 30% 11/08/2020. Stable on recheck 11/24/2021. ?- Continue Coreg, losartan.  ?-Hemodialysis discontinued per patient's request, full comfort care ? ?COPD (chronic obstructive pulmonary disease) ([place]) ?-Continue albuterol as needed ?Comfort care status ? ?Chronic respiratory failure with hypoxia (HCC) ?-Remains at baseline. ? ?Essential hypertension, benign ?- Antihypertensives discontinued ? ?Type 2 diabetes mellitus with diabetic neuropathy, unspecified ([place]) ?-SSI discontinued, full comfort care status ? ?  CAD (coronary artery disease) ?-Stable. No new WMA on echo. Recent cath with 70% RCA stenosis managed medically without other obstructive disease. No anginal complaints since that time.  ?- No further work up currently recommended per cardiology pre-operative risk assessment consultation on 3/19.  ? ?Hyperlipidemia ?LDL is 102.  Lipitor discontinued. ? ? ?Malnutrition  of moderate degree ?- Patient had significant nausea last night, will change diet to dysphagia 3, SLP evaluation ?Continue bowel regimen with Senokot as, Colace ? ?Anxiety and depression ?Continue Prozac and Ativan as needed for anxiety ? ? ?Code Status: DNR/DNI, comfort care ?DVT Prophylaxis:   ? ? ?Level of Care: Level of care: Palliative Care ?Family Communication: Goals of care addressed with patient and daughter at the bedside, see above ? ?Disposition Plan:     Remains inpatient appropriate: [facility] ? ?Procedures:  ?3/22:endarterectomy of the right external iliac, common femoral, right profunda femoris, right popliteal and tibial peroneal arteries as well as a femoral to tibial peroneal trunk bypass.   ?  ? ?Consultants:   ?PCCM ?Vascular surgery ?Palliative care ? ?Antimicrobials: None ? ? ?Medications ? ? Chlorhexidine Gluconate Cloth  6 each Topical Daily  ? docusate sodium  100 mg Oral Daily  ? FLUoxetine  20 mg Oral QHS  ? gabapentin  200 mg Oral QHS  ? senna-docusate  1 tablet Oral BID  ? ? ? ?Subjective:  ? ?Tina Gomez was seen and examined today.  Per patient, pain is better controlled with IV Dilaudid.  Was able to sleep some last night.  Daughter at the bedside ?Objective:  ? ?Vitals:  ? 12/01/21 2300 12/02/21 0336 12/02/21 0742 12/02/21 1154  ?BP: (!) 180/49 (!) 188/64  (!) 171/57  ?Pulse: 72 74  67  ?Resp: _0 ?Temp: 98.4 ?F (36.9 ?C) 98.9 ?F (37.2 ?C)  98.1 ?F (36.7 ?C)  ?TempSrc: Oral Oral  Axillary  ?SpO2: 100% 100% 97% 94%  ?Weight:      ?Height:      ? ? ?Intake/Output Summary (Last 24 hours) at 12/02/2021 1500 ?Last data filed at 12/02/2021 3662 ?Gross per 24 hour  ?Intake --  ?Output 0 ml  ?Net 0 ml  ? ?Filed Weights  ? 11/30/21 0500 11/30/21 1520 11/30/21 1845  ?Weight: 81.9 kg 77.9 kg 77.1 kg  ? ? ? ?Exam ?General: Alert and oriented x 3, ill-appearing, appears comfortable.   ?Cardiovascular: S1 S2 auscultated, RRR ?Respiratory: Diminished breath sound at the  bases ?Gastrointestinal: Soft, NT, ND, NBS ?Ext: no pedal edema bilaterally ?Neuro: not assessed ? ? ?Data Reviewed:  I have personally reviewed following labs  ? ? ?CBC ?Lab Results  ?Component Value Date  ? WBC 12.0 (H) 12/01/2021  ? RBC 2.46 (L) 12/01/2021  ? HGB 7.1 (L) 12/01/2021  ? HCT 23.7 (L) 12/01/2021  ? MCV 96.3 12/01/2021  ? MCH 28.9 12/01/2021  ? PLT 157 12/01/2021  ? MCHC 30.0 12/01/2021  ? RDW 16.8 (H) 12/01/2021  ? LYMPHSABS 1.5 11/21/2021  ? MONOABS 0.6 11/21/2021  ? EOSABS 0.1 11/21/2021  ? BASOSABS 0.0 11/21/2021  ? ? ? ?Last metabolic panel ?Lab Results  ?Component Value Date  ? NA 138 12/01/2021  ? K 3.7 12/01/2021  ? CL 102 12/01/2021  ? CO2 27 12/01/2021  ? BUN 19 12/01/2021  ? CREATININE 3.90 (H) 12/01/2021  ? GLUCOSE 105 (H) 12/01/2021  ? GFRNONAA 12 (L) 12/01/2021  ? GFRAA 13 (L) 06/15/2020  ? CALCIUM 8.0 (L) 12/01/2021  ?  PHOS 7.6 (H) 11/30/2021  ? PROT 7.1 11/21/2021  ? ALBUMIN 2.6 (L) 11/30/2021  ? LABGLOB 2.8 06/15/2020  ? AGRATIO 1.0 (L) 06/15/2020  ? BILITOT 0.7 11/21/2021  ? ALKPHOS 73 11/21/2021  ? AST 18 11/21/2021  ? ALT 10 11/21/2021  ? ANIONGAP 9 12/01/2021  ? ? ?CBG (last 3)  ?Recent Labs  ?  12/01/21 ?1740 12/01/21 ?2143 12/02/21 ?0615  ?GLUCAP 86 74 74  ?  ? ? ? ?Radiology Studies: I have personally reviewed the imaging studies  ?DG CHEST PORT 1 VIEW ? ?Result Date: 12/01/2021 ?IMPRESSION: 1. Mild pulmonary edema, similar to prior exams. Electronically Signed   By: Randa Ngo M.D.   On: 12/01/2021 14:54   ? ? ? ? ?Estill Cotta M.D. ?[facility] ?12/02/2021, 3:00 PM ? ?Available via Epic secure chat 7am-7pm ?After 7 pm, please refer to night coverage provider listed on amion. ? ?  ?

## 2021-12-02 NOTE — TOC Progression Note (Signed)
Transition of Care (TOC) - Progression Note  ? ? ?Patient Details  ?Name: Tina Gomez ?MRN: [id number] ?Date of Birth: July 14, 1956 ? ?Transition of Care (TOC) CM/SW Contact  ?Bary Castilla, LCSW ?Phone Number: [phone] ?12/02/2021, 9:41 AM ? ?Clinical Narrative:    ? ?CSW was alerted that pt wants to pursue residential hospice. CSW reached out to pt's daughter [name] to verify choice of facility. [name] confirmed [facility] as choice. CSW alerted [facility] staff Anderson [place] about choice. ? ?TOC team will continue to assist with discharge planning needs.  ? ?Expected Discharge Plan: [place] ?Barriers to Discharge: Continued Medical Work up ? ?Expected Discharge Plan and Services ?Expected Discharge Plan: [place] ?  ?  ?  ?Living arrangements for the past 2 months: [place] ?                ?  ?  ?  ?  ?  ?  ?  ?  ?  ?  ? ? ?Social Determinants of Health (SDOH) Interventions ?  ? ?Readmission Risk Interventions ? ?  09/13/2021  ? 11:57 AM 04/13/2021  ?  1:10 PM 07/28/2020  ?  2:50 PM  ?Readmission Risk Prevention Plan  ?Transportation Screening Complete Complete Complete  ?PCP or Specialist Appt within 3-5 Days   Not Complete  ?Not Complete comments   first available PCP is 12/16  ?[place] or Home Care Consult   Complete  ?Social Work Consult for [place] Planning/Counseling   Complete  ?Palliative Care Screening   Not Applicable  ?Medication Review (RN Care Manager) Referral to Pharmacy Complete   ?PCP or Specialist appointment within 3-5 days of discharge Complete Complete   ?[place] or Home Care Consult Complete Complete   ?SW Recovery Care/Counseling Consult Complete Complete   ?Palliative Care Screening Complete Not Applicable   ?[place] Not Applicable Not Applicable   ? ? ?

## 2021-12-02 NOTE — Assessment & Plan Note (Addendum)
Full comfort care started on 12/02/2021 after discussion among patient, patient's daughter and attending provider. ?

## 2021-12-03 DIAGNOSIS — Z515 Encounter for palliative care: Secondary | ICD-10-CM | POA: Diagnosis not present

## 2021-12-03 DIAGNOSIS — E1140 Type 2 diabetes mellitus with diabetic neuropathy, unspecified: Secondary | ICD-10-CM

## 2021-12-03 DIAGNOSIS — I5022 Chronic systolic (congestive) heart failure: Secondary | ICD-10-CM | POA: Diagnosis not present

## 2021-12-03 DIAGNOSIS — J9611 Chronic respiratory failure with hypoxia: Secondary | ICD-10-CM | POA: Diagnosis not present

## 2021-12-03 DIAGNOSIS — E440 Moderate protein-calorie malnutrition: Secondary | ICD-10-CM

## 2021-12-03 DIAGNOSIS — I70223 Atherosclerosis of native arteries of extremities with rest pain, bilateral legs: Secondary | ICD-10-CM | POA: Diagnosis not present

## 2021-12-03 DIAGNOSIS — Z7189 Other specified counseling: Secondary | ICD-10-CM | POA: Diagnosis not present

## 2021-12-03 DIAGNOSIS — N186 End stage renal disease: Secondary | ICD-10-CM | POA: Diagnosis not present

## 2021-12-03 MED ORDER — ACETAMINOPHEN 325 MG PO TABS: 650.0000 mg | ORAL_TABLET | Freq: Four times a day (QID) | ORAL | Status: AC | PRN

## 2021-12-03 MED ORDER — NALOXONE HCL 0.4 MG/ML IJ SOLN: 0.4000 mg | INTRAMUSCULAR | Status: DC | PRN

## 2021-12-03 MED ORDER — SODIUM CHLORIDE 0.9 % IV SOLN
1.0000 mg/h | INTRAVENOUS | Status: DC
  Administered 2021-12-03: 1 mg/h via INTRAVENOUS
  Filled 2021-12-03: qty 2.5

## 2021-12-03 MED ORDER — GLYCOPYRROLATE 0.2 MG/ML IJ SOLN: 0.2000 mg | INTRAMUSCULAR | Status: AC | PRN

## 2021-12-03 MED ORDER — ONDANSETRON HCL 4 MG/2ML IJ SOLN: 4.0000 mg | Freq: Four times a day (QID) | INTRAMUSCULAR | 0 refills | Status: AC | PRN

## 2021-12-03 MED ORDER — SODIUM CHLORIDE 0.9% FLUSH: 9.0000 mL | INTRAVENOUS | Status: DC | PRN

## 2021-12-03 MED ORDER — HYDROMORPHONE 1 MG/ML IV SOLN: INTRAVENOUS | Status: DC

## 2021-12-03 MED ORDER — LORAZEPAM 2 MG/ML PO CONC: 1.0000 mg | ORAL | 0 refills | Status: AC | PRN

## 2021-12-03 MED ORDER — HYDROXYZINE HCL 25 MG PO TABS: 25.0000 mg | ORAL_TABLET | Freq: Three times a day (TID) | ORAL | Status: DC | PRN

## 2021-12-03 MED ORDER — HYDROMORPHONE HCL 1 MG/ML IJ SOLN
1.0000 mg | INTRAMUSCULAR | Status: DC | PRN
Start: 2021-12-03 — End: 2021-12-04
  Administered 2021-12-03: 1 mg via INTRAVENOUS
  Filled 2021-12-03: qty 1

## 2021-12-03 MED ORDER — HYDROMORPHONE BOLUS VIA INFUSION
1.0000 mg | INTRAVENOUS | Status: DC | PRN
  Filled 2021-12-03: qty 1

## 2021-12-03 MED ORDER — HYDROMORPHONE HCL 2 MG PO TABS: 4.0000 mg | ORAL_TABLET | ORAL | Status: DC | PRN

## 2021-12-03 MED ORDER — HYDROMORPHONE HCL 1 MG/ML IJ SOLN
1.0000 mg | Freq: Once | INTRAMUSCULAR | Status: AC
  Administered 2021-12-03: 1 mg via INTRAVENOUS
  Filled 2021-12-03: qty 1

## 2021-12-03 MED ORDER — HYDROMORPHONE HCL 1 MG/ML IJ SOLN: 1.0000 mg | INTRAMUSCULAR | 0 refills | Status: AC | PRN

## 2021-12-03 NOTE — Progress Notes (Signed)
Manufacturing engineer [facility] Hospital Liaison note.    ? ?This patient is approved to transfer to [facility] today.   ? ?Please leave IV access in place.  ? ?ACC will notify TOC when registration paperwork has been completed to arrange transport.   ? ?RN please call report to [phone].  ? ?Thank you,     ?Farrel Gordon, RN, CCM       ?[facility] Hospital Liaison  ?[phone] ?

## 2021-12-03 NOTE — TOC Progression Note (Addendum)
Transition of Care (TOC) - Progression Note  ? ? ?Patient Details  ?Name: Tina Gomez ?MRN: [id number] ?Date of Birth: 18-Jun-1956 ? ?Transition of Care (TOC) CM/SW Contact  ?Marilu Favre, RN ?Phone Number: ?12/03/2021, 11:11 AM ? ?Clinical Narrative:    ?Patient has a bed at [facility] today. Number to call report [phone].  ? ?Once consents signed. Audrea Muscat with [facility] will let NCM to call [facility].  ? ?DNR Form needs to be signed MD aware  ? ?[place] with [facility] ready for [facility] to be called. Nurse given number to call report . [facility] called. PT[facility] paperwork in chart  ?Expected Discharge Plan: [place] ?Barriers to Discharge: Continued Medical Work up ? ?Expected Discharge Plan and Services ?Expected Discharge Plan: [place] ?  ?  ?  ?Living arrangements for the past 2 months: [place] ?                ?  ?  ?  ?  ?  ?  ?  ?  ?  ?  ? ? ?Social Determinants of Health (SDOH) Interventions ?  ? ?Readmission Risk Interventions ? ?  09/13/2021  ? 11:57 AM 04/13/2021  ?  1:10 PM 07/28/2020  ?  2:50 PM  ?Readmission Risk Prevention Plan  ?Transportation Screening Complete Complete Complete  ?PCP or Specialist Appt within 3-5 Days   Not Complete  ?Not Complete comments   first available PCP is 12/16  ?[place] or Home Care Consult   Complete  ?Social Work Consult for [place] Planning/Counseling   Complete  ?Palliative Care Screening   Not Applicable  ?Medication Review (RN Care Manager) Referral to Pharmacy Complete   ?PCP or Specialist appointment within 3-5 days of discharge Complete Complete   ?[place] or Home Care Consult Complete Complete   ?SW Recovery Care/Counseling Consult Complete Complete   ?Palliative Care Screening Complete Not Applicable   ?[place] Not Applicable Not Applicable   ? ? ?

## 2021-12-03 NOTE — Discharge Summary (Signed)
? ?Physician Discharge Summary  ?LUMEN BRINLEE ZOX:[id number] DOB: [birth year] DOA: 11/21/2021 ? ?PCP: Charlott Rakes, MD ? ?Admit date: 11/21/2021 ?Discharge date: 12/03/2021 ?Admitted From: Home ?Disposition: Residential hospice at [place]. ? ?Discharge Condition: Poor prognosis. ?CODE STATUS: DNR/DNI ? Follow-up Information   ? ? Cherre Robins, MD Follow up in 3 week(s).   ?Specialties: Vascular Surgery, Interventional Cardiology ?Why: Office will call you to arrange your appt (sent) ?Contact information: ?[address] ?[place] [address] ?[phone] ? ? ?  ?  ? ?  ?  ? ?  ? ? ?Hospital course ?MERRANDA BOLLS is a 66 y.o. 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  Started on IV heparin and seen by vascular surgery. Aortogram performed with lower extremity runoff confirms severe PAD bilaterally. Recommendations have included strong consideration for transition to hospice care given her elevated risk of mortality/morbidity with operative management. The patient has been clear that she does not desire transition to comfort care nor amputation, but wants to proceed with revascularization. Cardiology has provided risk assessment with no recommendations for further work up.  ? ?Patient underwent endarterectomy and right fem-pop bypasses for critical limb ischemia on 11/28/2021.  She was transferred from [facility] service to [facility] on 12/01/2021.  Therapy recommended CIR.  However, after further goals of care discussion patient and family decided to pursue full comfort care and to stop hemodialysis and further intervention. ? ?Patient is transferred to residential hospice at the [facility] on 12/03/2021 for end-of-life care. ?  ? ?See  individual problem list below for more on hospital course. ? ?Problems addressed during this hospitalization ?Problem  ?End of Life Care  ?Goals of Care, Counseling/Discussion  ?Critical Limb Ischemia of Both Lower Extremities (Hcc)  ?Esrd On Hemodialysis (Hcc)  ?Chronic Systolic Chf (Congestive Heart Failure) (Hcc)  ?Copd (Chronic Obstructive Pulmonary Disease) (Hcc)  ?Chronic Respiratory Failure With Hypoxia (Hcc)  ?Essential Hypertension, Benign  ?Malnutrition of moderate degree  ?Anxiety and Depression  ?  ?Assessment and Plan: ?* Critical limb ischemia of both lower extremities ([place]) ?S/p endarterectomy and right femoropopliteal bypass on 3/22.  ? ?End of life care ?Patient is transferred to residential hospice at [place] for further end-of-life care. ? ?Goals of care, counseling/discussion ?Full comfort care started on 12/02/2021 after discussion among patient, patient's daughter and attending provider. ? ?ESRD on hemodialysis ([place]) ?Now full comfort care. ? ?Chronic systolic CHF (congestive heart failure) ([place]) ?Now full comfort care. ? ?COPD (chronic obstructive pulmonary disease) ([place]) ?Full comfort care. ? ?Chronic respiratory failure with hypoxia ([place]) ?Full comfort care. ? ?Essential hypertension, benign ?Full comfort care. ? ?Type 2 diabetes mellitus with diabetic neuropathy, unspecified ([place]) ?-SSI discontinued, full comfort care status ? ?CAD (coronary artery disease) ?-Stable. No new WMA on echo. Recent cath with 70% RCA stenosis managed medically without other obstructive disease. No anginal complaints since that time.  ?- No further work up currently recommended per cardiology pre-operative risk assessment consultation on 3/19.  ? ?Hyperlipidemia ?LDL is 102.  Lipitor discontinued. ? ? ?Malnutrition of moderate degree ?Full comfort care. ? ?Anxiety and depression ?Ativan as needed for anxiety. ? ? ?Moderate malnutrition ?Nutrition Problem: Moderate Malnutrition ?Etiology: chronic illness (ESRD  on HD, CHF, COPD, limb ischemia) ?Signs/Symptoms: mild muscle  depletion, mild fat depletion ?Interventions: Refer to RD note for recommendations, Magic cup, MVI, Liberalize Diet, Nepro shake ? ?  ? ?Vital signs ?Vitals:  ? 12/02/21 0742 12/02/21 1154 12/02/21 1550 12/03/21 0555  ?BP:  (!) 171/57 (!) 171/56 (!) 172/58  ?Pulse:  67 71 72  ?Temp:  98.1 ?F (36.7 ?C) 98.3 ?F (36.8 ?C) 98.1 ?F (36.7 ?C)  ?Resp:  12 12 16   ?Height:      ?Weight:      ?SpO2: 97% 94% 96% 93%  ?TempSrc:  Axillary Oral Oral  ?BMI (Calculated):      ?  ? ?Discharge exam ? ?GENERAL: No apparent distress.  Nontoxic.\ ?RESP:  No IWOB.  ?MSK/EXT:  Moves extremities.  ?SKIN: no apparent skin lesion or wound ?NEURO: Awake and alert. Oriented appropriately.  No apparent focal neuro deficit. ?PSYCH: Some distress from lower extremity pain. ? ?Discharge Instructions ? ?Allergies as of 12/03/2021   ? ?   Reactions  ? Umeclidinium Bromide Anaphylaxis  ? Codeine Nausea And Vomiting  ? Adhesive [tape] Other (See Comments)  ? Tape breaks out the skin if it is left on for a lengthy period of time  ? ?  ? ?  ?Medication List  ?  ? ?STOP taking these medications   ? ?albuterol (2.5 MG/3ML) 0.083% nebulizer solution ?Commonly known as: PROVENTIL ?  ?albuterol 108 (90 Base) MCG/ACT inhaler ?Commonly known as: Ventolin HFA ?  ?aspirin 325 MG tablet ?  ?atorvastatin 40 MG tablet ?Commonly known as: LIPITOR ?  ?BAYER BACK & BODY PO ?  ?carvedilol 12.5 MG tablet ?Commonly known as: COREG ?  ?DIALYVITE 800 WITH ZINC 0.8 MG Tabs ?  ?FLUoxetine 20 MG capsule ?Commonly known as: PROzac ?  ?freestyle lancets ?  ?gabapentin 100 MG capsule ?Commonly known as: NEURONTIN ?  ?hydrOXYzine 25 MG tablet ?Commonly known as: ATARAX ?  ?lidocaine-prilocaine cream ?Commonly known as: EMLA ?  ?loperamide 2 MG tablet ?Commonly known as: IMODIUM A-D ?  ?losartan 100 MG tablet ?Commonly known as: COZAAR ?  ?ondansetron 4 MG tablet ?Commonly known as: Zofran ?Replaced by: ondansetron 4  MG/2ML Soln injection ?  ?OXYGEN ?  ?Spiriva Respimat 2.5 MCG/ACT Aers ?Generic drug: Tiotropium Bromide Monohydrate ?  ?TRUEplus Lancets 28G Misc ?  ?TRUEtest Test test strip ?Generic drug: glucose blood ?  ? ?  ? ?TAKE these medications   ? ?acetaminophen 325 MG tablet ?Commonly known as: TYLENOL ?Take 2 tablets (650 mg total) by mouth every 6 (six) hours as needed for mild pain, headache or fever (or Fever >/= 101). ?What changed:  ?medication strength ?how much to take ?reasons to take this ?  ?glycopyrrolate 0.2 MG/ML injection ?Commonly known as: ROBINUL ?Inject 1 mL (0.2 mg total) into the skin every 4 (four) hours as needed (excessive secretions). ?  ?HYDROmorphone 1 MG/ML injection ?Commonly known as: DILAUDID ?Inject 1 mL (1 mg total) into the vein every 2 (two) hours as needed for severe pain or moderate pain. ?  ?LORazepam 2 MG/ML concentrated solution ?Commonly known as: ATIVAN ?Place 0.5 mLs (1 mg total) under the tongue every 4 (four) hours as needed for anxiety. ?  ?ondansetron 4 MG/2ML Soln injection ?Commonly known as: ZOFRAN ?Inject 2 mLs (4 mg total) into the vein every 6 (six) hours as needed for nausea. ?Replaces: ondansetron 4 MG tablet ?  ? ?  ? ? ?Consultations: ?Vascular surgery ?Pulmonology ?Palliative medicine ? ?Procedures/Studies: ?As above. ? ? ?CT Angio Aortobifemoral W and/or Wo  Contrast ? ?Result Date: 11/21/2021 ?CLINICAL DATA:  Bilateral foot pain for several weeks, right greater than left EXAM: CT ANGIOGRAPHY OF ABDOMINAL AORTA WITH ILIOFEMORAL RUNOFF TECHNIQUE: Multidetector CT imaging of the abdomen, pelvis and lower extremities was performed using the standard protocol during bolus administration of intravenous contrast. Multiplanar CT image reconstructions and MIPs were obtained to evaluate the vascular anatomy. RADIATION DOSE REDUCTION: This exam was performed according to the departmental dose-optimization program which includes automated exposure control, adjustment of the  mA and/or kV according to patient size and/or use of iterative reconstruction technique. CONTRAST:  156mL OMNIPAQUE IOHEXOL 350 MG/ML SOLN COMPARISON:  None. FINDINGS: VASCULAR Aorta: Normal caliber aorta witho

## 2021-12-03 NOTE — Progress Notes (Signed)
Patient OV:Tina Gomez      DOB: 27-Jun-1956      FOY:[id number] ? ? ? ?  ?Palliative Medicine Team ? ? ? ?Subjective: Bedside symptom check. Daughter bedside at time of visit.  ? ? ?Physical exam: Patient laying in bed with eyes closed at time of visit. Daughter shared that they would be open to other hospice homes in either [place] or [place] if a bed came open earlier as they live in [place]. This RN asked if there were specific concerns inpatient that could be addressed. Daughter shared with this RN that the patient is experiencing pain poorly controlled with current orders. Reports that fentanyl IV is not controlling pain nearly as well as dilaudid IV but both are not lasting long enough for experienced pain. Patient did open eyes, maintain eye contact, and endorse that she is experiencing ongoing pain to her bilateral lower extremities. Daughter also asked about medication to help with secretions. This RN provided education on robinol and the current pain medications to best control pain for this patient.  ? ? ?Assessment and plan: This RN collaborated with Physiological scientist for needs of this patient. This RN spoke with Elie Confer, NP regarding needs for better pain control. Verbal order for one time additional dose for IV dilaudid, see eMAR. Educated bedside RN on robinol indications for this patient. Bedside RN in agreement. Gregary Signs, NP to see this patient bedside and assess any additional interventions needed.  ? ?Upon re-entering the patient's room, the daughter shared that they received a call from [facility] and that they were offered a bed today. This RN educated them on the process for transfer and that we will collaborate to ensure a smooth transition. Daughter and patient in agreement. Will continue to follow along for needs or advances.  ? ? ? ?Thank you for allowing the Palliative Medicine Team to assist in the care of this patient. ?  ?  ?Damian Leavell, MSN, RN ?Palliative Medicine  Team ?Team Phone: [phone]  ?This phone is monitored 7a-7p, please reach out to attending physician outside of these hours for urgent needs.   ?

## 2021-12-03 NOTE — Assessment & Plan Note (Signed)
Patient is transferred to residential hospice at [place] for further end-of-life care. ?

## 2021-12-03 NOTE — Progress Notes (Signed)
[facility] [place] ?PROGRESS NOTE ? ?ASSESSMENT / PLAN: ?Tina Gomez is a 66 y.o. female with bilateral critical limb ischemia s/p R fem-pop bypass. Now desiring transition to comfort measures only. I've added a PCA for pain control. Atarax for itching ordered. Appreciate IM and palliative care services. Please call for questions. I will continue to follow until her transfer to hospice.   ? ?SUBJECTIVE: ?Severe pain in groin.  ? ?OBJECTIVE: ?BP (!) 172/58 (BP Location: Right Arm)   Pulse 72   Temp 98.1 ?F (36.7 ?C) (Oral)   Resp 16   Ht 5\' 2"  (1.575 m)   Wt 77.1 kg   LMP  (LMP Unknown)   SpO2 93%   BMI 31.09 kg/m?  ? ?Mild distress ?RRR ?Unlabored ?Incisions in R leg clean and dry ? ? ?  Latest Ref Rng & Units 12/01/2021  ?  3:21 AM 11/30/2021  ?  1:55 PM 11/30/2021  ? 12:48 AM  ?CBC  ?WBC 4.0 - 10.5 K/uL 12.0   14.4   13.0    ?Hemoglobin 12.0 - 15.0 g/dL 7.1   8.0   8.4    ?Hematocrit 36.0 - 46.0 % 23.7   26.1   28.2    ?Platelets 150 - 400 K/uL 157   183   177    ?  ? ? ?  Latest Ref Rng & Units 12/01/2021  ?  3:21 AM 11/30/2021  ?  1:55 PM 11/29/2021  ?  4:13 PM  ?CMP  ?Glucose 70 - 99 mg/dL 105   116   111    ?BUN 8 - 23 mg/dL 19   36   23    ?Creatinine 0.44 - 1.00 mg/dL 3.90   5.80   4.14    ?Sodium 135 - 145 mmol/L 138   135   137    ?Potassium 3.5 - 5.1 mmol/L 3.7   4.5   3.9    ?Chloride 98 - 111 mmol/L 102   99   101    ?CO2 22 - 32 mmol/L 27   25   29     ?Calcium 8.9 - 10.3 mg/dL 8.0   8.3   8.2    ? ? ?Estimated Creatinine Clearance: 14 mL/min (A) (by C-G formula based on SCr of 3.9 mg/dL (H)). ? ? ?Tina Aline. Stanford Breed, MD ?[facility] [place] ?Office Phone Number: [phone] ?12/03/2021 10:12 AM ? ? ? ?

## 2021-12-03 NOTE — Progress Notes (Signed)
Report given to [place] at [facility] ?

## 2021-12-03 NOTE — Progress Notes (Signed)
? ?                                                                                                                                                     ?                                                   ?Daily Progress Note  ? ?Patient Name: Tina Gomez       Date: 12/03/2021 ?DOB: 08/26/1956  Age: 66 y.o. MRN#: [id number] ?Attending Physician: Mercy Riding, MD ?Primary Care Physician: Charlott Rakes, MD ?Admit Date: 11/21/2021 ? ?Reason for Consultation/Follow-up: symptom management, end of life care ? ?HPI/Patient Profile: 66 y.o. 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 She presented to the ED on on 11/21/2021 with pain in both feet for 2 weeks that had worsened over the past 2 days. Patient also reported cold sensation and discoloration to both feet. ABIs showed moderate RLE arterial disease and critical left limb ischemia. Admitted to [facility].  ? ?Status post right femoral popliteal bypass on 11/28/21. ? ? ?Subjective: ?10:45 - I was notified by PMT RN that patient's pain is not well-controlled. Verbal order given for dilaudid 1 mg IV now. ? I went to see patient at bedside. She is alert and tells me it "hurts so bad". I provided emotional support and reassurance that we would get her pain under control. Daughter at bedside - I let her know I would be ordering a dilaudid infusion. ? ?13:35 - I returned to bedside. Dilaudid infusion is at 1 mg/hr. Patient is now resting comfortably - I did not attempt to wake her.  No non-verbal signs of pain or discomfort noted. Respirations are even and unlabored. No excessive respiratory secretions noted. Family present at bedside. Education and counseling provided on natural trajectory at EOL. Emotional support provided. Daughter expresses appreciation for PMT support.  ?   ? ?Objective: ? ?Physical Exam ?Vitals reviewed.  ?Constitutional:   ?   General: She is not in acute distress. ?   Appearance: She is ill-appearing.  ?Pulmonary:  ?   Effort: Pulmonary effort is normal.  ?Musculoskeletal:  ?   Comments: RLE with staples  ?         ? ?Vital Signs: BP (!) 172/58 (BP Location: Right Arm)   Pulse 72   Temp 98.1 ?F (36.7 ?C) (Oral)   Resp 16   Ht 5\' 2"  (1.575 m)   Wt 77.1 kg   LMP  (LMP Unknown)  SpO2 93%   BMI 31.09 kg/m?  ?SpO2: SpO2: 93 % ?O2 Device: O2 Device: Nasal Cannula ?O2 Flow Rate: O2 Flow Rate (L/min): 4 L/min ? ?Intake/output summary: No intake or output data in the 24 hours ending 12/03/21 1057 ?LBM: Last BM Date : 11/27/21 ?Baseline Weight: Weight: 77.1 kg ?Most recent weight: Weight: 77.1 kg ? ?     ?Palliative Assessment/Data: PPS 20% ? ? ? ? ? ?Palliative Care Assessment & Plan  ? ?Assessment: ?- critical limb ischemia of both lower extremities ?- status post right femoropopliteal bypass surgery on  ?- ESRD on HD ?- chronic systolic CHF ?- chronic respiratory failure with hypoxia ?- COPD ? ?Recommendations/Plan: ?Continue full comfort measures ?Start dilaudid infusion - please bolus with 1 mg every 30 minutes as needed for uncontrolled pain ?Pending discharge to [facility] today ? ? ?Code Status: DNR/DNI (as previously documented) ? ?Prognosis: ? < 2 weeks ? ?Discharge Planning: ?Hospice facility ? ? ? ?Thank you for allowing the Palliative Medicine Team to assist in the care of this patient. ? ?MDM - High ? ? ?Lavena Bullion, NP ? ?Please contact Palliative Medicine Team phone at [phone] for questions and concerns.  ? ? ? ? ? ?

## 2021-12-03 NOTE — Progress Notes (Signed)
30 ml of Dilaudid wasted with CN- Miriam RN ?

## 2021-12-08 DEATH — deceased

## 2021-12-11 ENCOUNTER — Other Ambulatory Visit: Payer: Self-pay

## 2021-12-11 DIAGNOSIS — I70223 Atherosclerosis of native arteries of extremities with rest pain, bilateral legs: Secondary | ICD-10-CM

## 2021-12-21 NOTE — Progress Notes (Deleted)
?  POST OPERATIVE OFFICE NOTE ? ? ? ?CC:  F/u for surgery ? ?HPI:  This is a 66 y.o. female who is s/p Right external iliac, common femoral and extensive right profunda femoris endarterectomy (second order branches of profunda) and bovine patch angioplasty,  Right popliteal and tibioperoneal trunk endarterectomy and bovine patch angioplasty,  Right common femoral to tibioperoneal trunk bypass with 69mm ePTFE and Right lower extremity angiogram on 11/28/2021 by Dr. Stanford Breed. ? ?Pt returns today for follow up.  Pt states *** ? ?Allergies  ?Allergen Reactions  ? Umeclidinium Bromide Anaphylaxis  ? Codeine Nausea And Vomiting  ? Adhesive [Tape] Other (See Comments)  ?  Tape breaks out the skin if it is left on for a lengthy period of time  ? ? ?Current Outpatient Medications  ?Medication Sig Dispense Refill  ? acetaminophen (TYLENOL) 325 MG tablet Take 2 tablets (650 mg total) by mouth every 6 (six) hours as needed for mild pain, headache or fever (or Fever >/= 101).    ? glycopyrrolate (ROBINUL) 0.2 MG/ML injection Inject 1 mL (0.2 mg total) into the skin every 4 (four) hours as needed (excessive secretions). 1 mL   ? HYDROmorphone (DILAUDID) 1 MG/ML injection Inject 1 mL (1 mg total) into the vein every 2 (two) hours as needed for severe pain or moderate pain. 1 mL 0  ? LORazepam (ATIVAN) 2 MG/ML concentrated solution Place 0.5 mLs (1 mg total) under the tongue every 4 (four) hours as needed for anxiety. 30 mL 0  ? ondansetron (ZOFRAN) 4 MG/2ML SOLN injection Inject 2 mLs (4 mg total) into the vein every 6 (six) hours as needed for nausea. 2 mL 0  ? ?No current facility-administered medications for this visit.  ? ? ? ROS:  See HPI ? ?Physical Exam: ? ?*** ? ?Incision:  *** ?Extremities:  *** ?Neuro: *** ?Abdomen:  *** ? ? ?Assessment/Plan:  This is a 66 y.o. female who is s/p: ?1) Right external iliac, common femoral and extensive right profunda femoris endarterectomy (second order branches of profunda) and bovine patch  angioplasty ?2) Right popliteal and tibioperoneal trunk endarterectomy and bovine patch angioplasty ?3) Right common femoral to tibioperoneal trunk bypass with 60mm ePTFE ?4) Right lower extremity angiogram ?By Dr. Stanford Breed on 11/28/2021 ? ?-*** ? ? ?*** ?[facility] ?[phone] ? ? ?Clinic MD:  *** ? ?

## 2021-12-25 ENCOUNTER — Encounter (HOSPITAL_COMMUNITY): Payer: Medicare Other

## 2022-02-19 ENCOUNTER — Telehealth: Payer: Self-pay

## 2022-02-19 NOTE — Telephone Encounter (Signed)
LVM for Patient to return call  RE: Schedule AWV with [facility] with Anum Palecek CMA  Please transfer patient when if they return the call  

## 2022-02-28 IMAGING — CR DG KNEE 1-2V*L*
2 series · 2 of 2 positions shown · non-contrast
Comparison: None.

CLINICAL DATA: Fall.

EXAM:
LEFT KNEE - 1-2 VIEW

[knee ap]
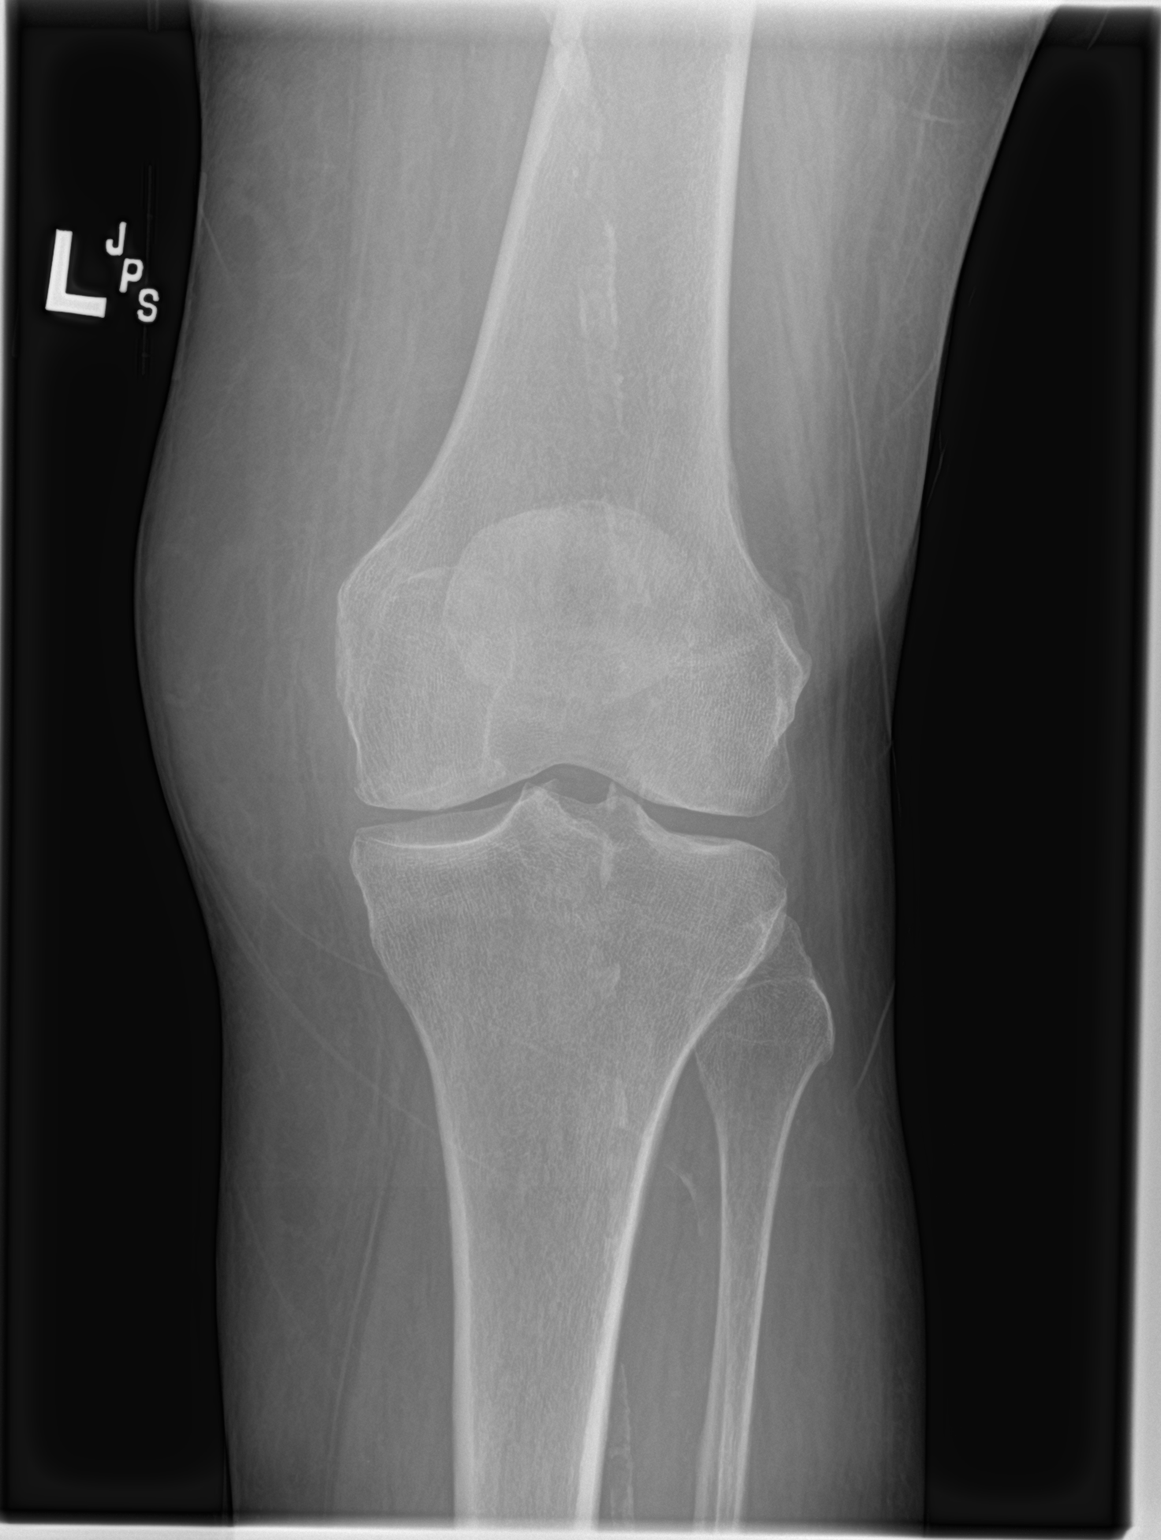

[knee lat]
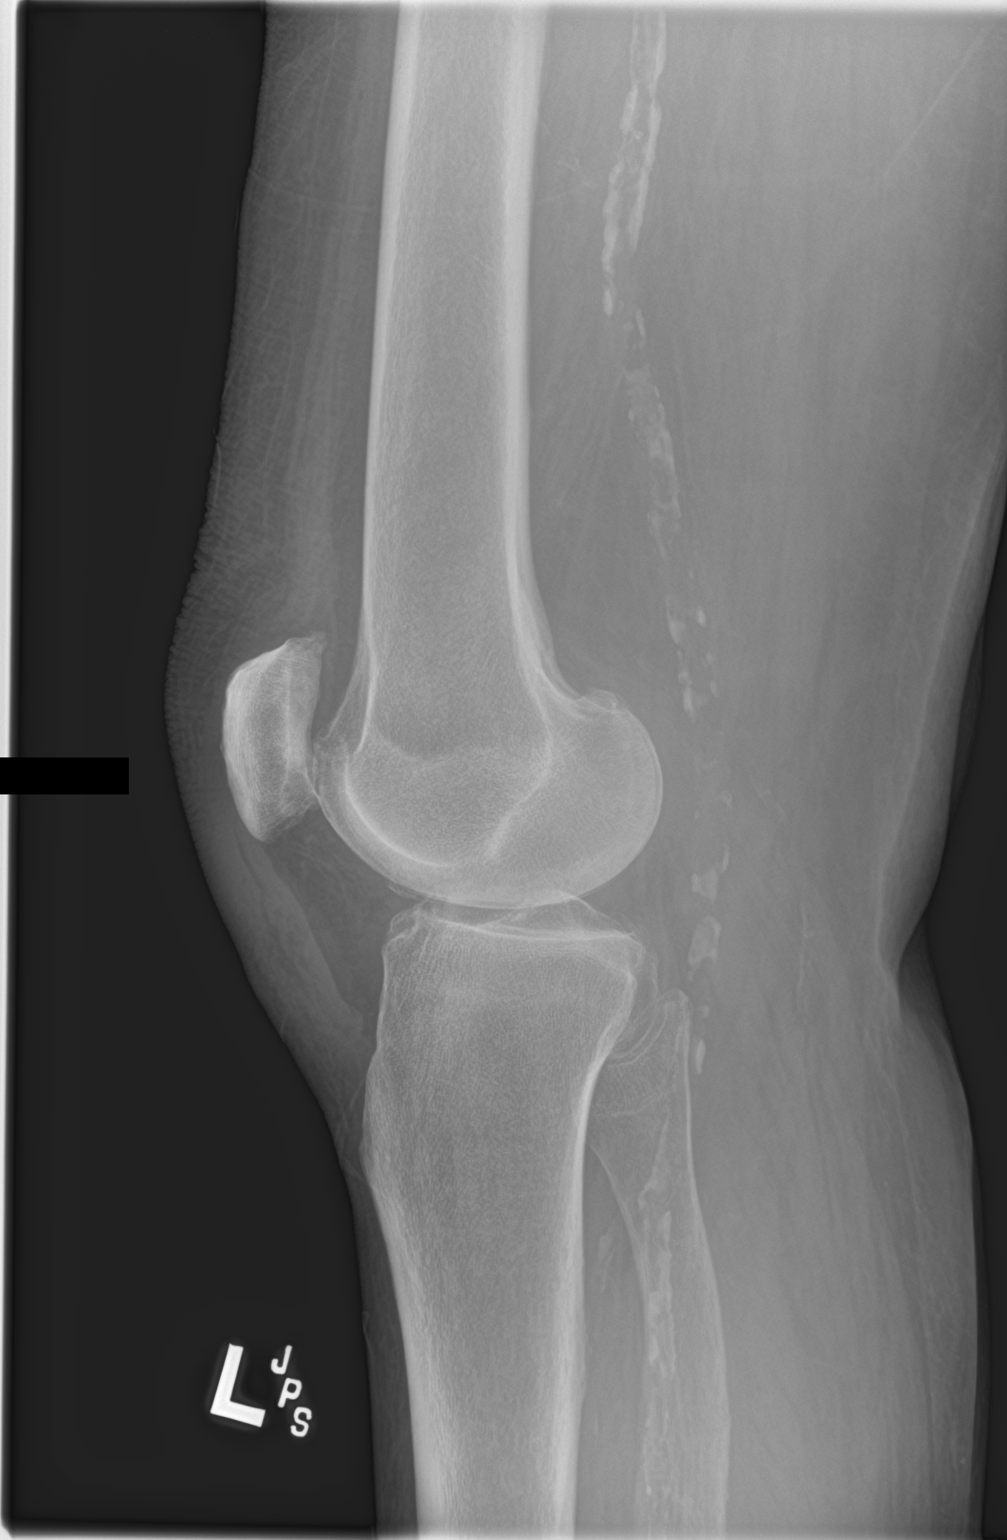

[2 of 2 positions shown; findings below may reference images not displayed]

FINDINGS: Negative for fracture or joint effusion. Normal alignment. Joint
spaces well maintained

Moderate atherosclerotic calcification.
IMPRESSION: Negative for fracture.

## 2022-03-01 IMAGING — DX DG CHEST 1V PORT
1 series · 1 of 1 positions shown · non-contrast
Comparison: Chest and left rib series 02/19/2021 and earlier.

CLINICAL DATA: 64-year-old female status post fall yesterday. Left
side pain.

EXAM:
PORTABLE CHEST 1 VIEW

[chest]
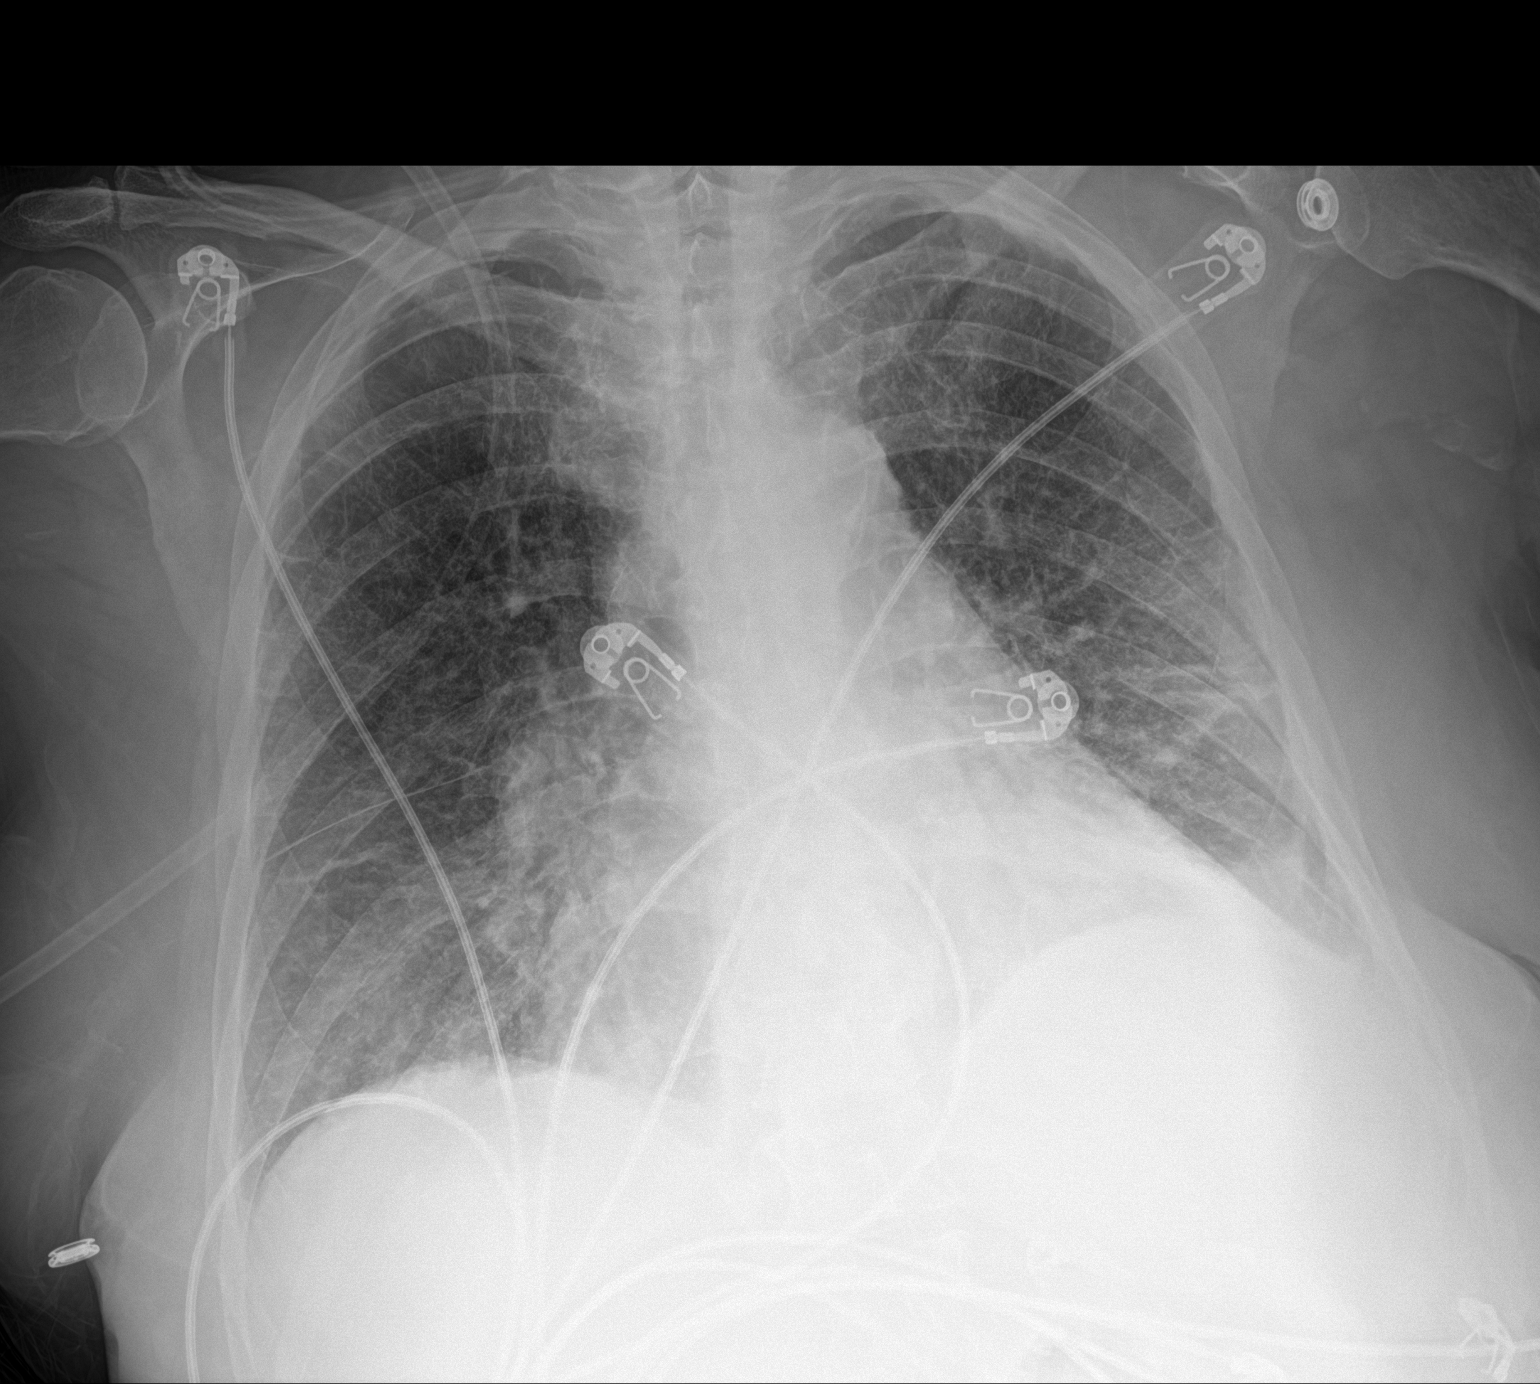

[1 of 1 positions shown; findings below may reference images not displayed]

FINDINGS: Portable AP semi upright view at 2921 hours. Stable lung volumes and
mediastinal contours with chronic cardiomegaly. Decreased fluid or
thickening along the right minor fissure since yesterday. Streaky
left mid and lower lung opacity is stable and most resembles
atelectasis. Stable pulmonary vascularity, no overt edema. No
pneumothorax. No definite effusion. Stable visualized osseous
structures. Paucity of bowel gas in the upper abdomen.
IMPRESSION: Atelectasis not significantly changed since yesterday. Chronic
cardiomegaly. No new cardiopulmonary abnormality.

## 2022-11-30 IMAGING — CT CT ANGIO AOBIFEM WO/W CM
1 of 7 series · 4 of 16 positions shown, 5 images · non-contrast
Comparison: None.

CLINICAL DATA: Bilateral foot pain for several weeks, right greater
than left

EXAM:
CT ANGIOGRAPHY OF ABDOMINAL AORTA WITH ILIOFEMORAL RUNOFF
TECHNIQUE: Multidetector CT imaging of the abdomen, pelvis and lower
extremities was performed using the standard protocol during bolus
administration of intravenous contrast. Multiplanar CT image
reconstructions and MIPs were obtained to evaluate the vascular
anatomy.

[Series 6: cta runoff (id) · axial · 0.98mm/px · z∈[-1140,-339]mm · 4 of 447 slices shown, 5 images]
[im 90/447  soft-tissue]
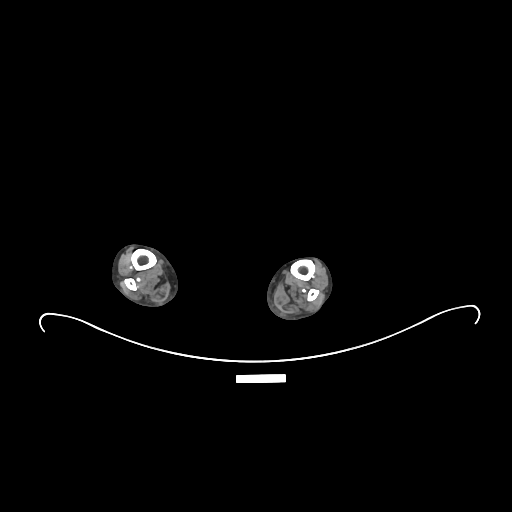
[im 90/447  bone]
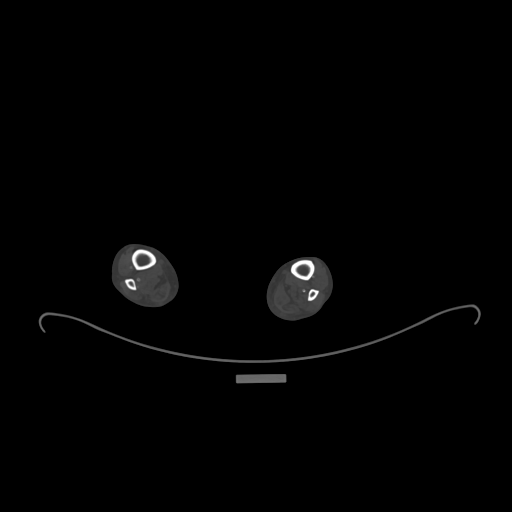
[im 179/447  soft-tissue]
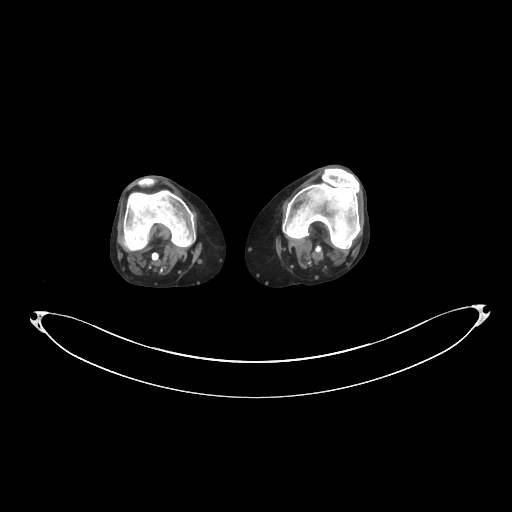
[im 268/447  soft-tissue]
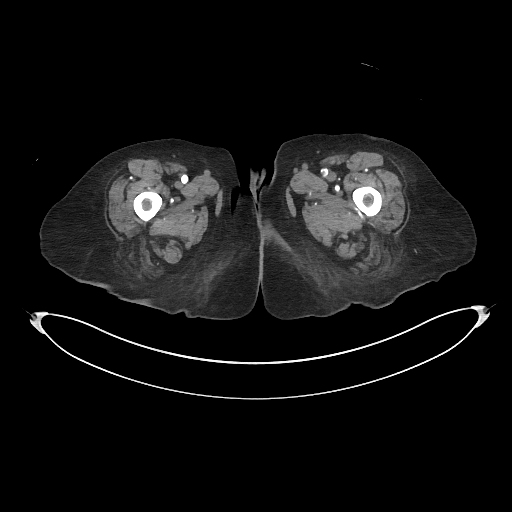
[im 357/447  soft-tissue]
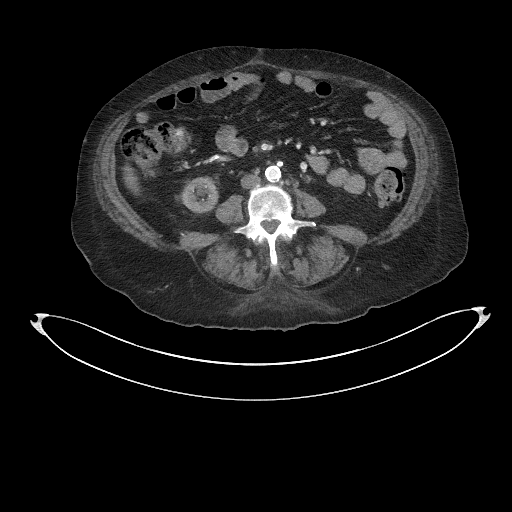

[4 of 16 positions shown; findings below may reference images not displayed]

RADIATION DOSE REDUCTION: This exam was performed according to the
departmental dose-optimization program which includes automated
exposure control, adjustment of the mA and/or kV according to
patient size and/or use of iterative reconstruction technique.

CONTRAST:  100mL OMNIPAQUE IOHEXOL 350 MG/ML SOLN
FINDINGS: VASCULAR

Aorta: Normal caliber aorta without aneurysm, dissection, vasculitis
or significant stenosis. Severe diffuse atherosclerosis.

Celiac: Patent without evidence of aneurysm, dissection, vasculitis
or significant stenosis. Severe diffuse atherosclerosis, with
multifocal high-grade narrowing throughout the splenic artery.

SMA: Patent without evidence of aneurysm, dissection, vasculitis or
significant stenosis. Severe diffuse atherosclerosis.

Renals: Severe diffuse bilateral atherosclerosis. Estimated 70-99%
stenosis within the origin of the bilateral renal arteries. No
evidence of aneurysm, dissection, or vasculitis.

IMA: Patent without evidence of aneurysm, dissection, vasculitis.
Severe diffuse atherosclerosis with high-grade stenosis at the
origin.

RIGHT Lower Extremity

Inflow: There is severe diffuse atherosclerosis involving the right
common, internal, and external iliac arteries. No focal high-grade
stenosis, aneurysm, dissection, or vasculitis.

Outflow: Heavily calcified atheromatous plaque severely limits
evaluation of the right common femoral, superficial femoral,
profundus femoral, and popliteal artery. There is at least segmental
occlusion of the proximal aspect of the superficial femoral artery.
Evaluation of the profundus femoral and popliteal arteries is
nondiagnostic given diminutive caliber and extensive calcified
atheromatous plaque.

Runoff: Evaluation is limited due to heavily calcified atheromatous
plaque throughout the trifurcation vessels. The anterior tibial
artery is patent through the level of the ankle. The peroneal artery
is diminutive but patent at its origin. It is not visualized beyond
the level of the proximal calf. The posterior tibial artery is
heavily calcified, but appears to enhance through the level of the
ankle.

LEFT Lower Extremity

Inflow: There is severe diffuse atherosclerosis involving the left
common, internal, and external iliac arteries. No focal stenosis,
aneurysm, dissection, or vasculitis.

Outflow: Heavily calcified atheromatous plaque severely limits
evaluation of the left common femoral, superficial femoral,
profundus femoral, and popliteal artery. There is diffuse high-grade
stenosis of the common femoral artery. Multifocal long segment
occlusions of the left superficial femoral artery noted. Evaluation
of the profundus femoral artery and popliteal artery are limited due
to heavily calcified plaque and diminutive nature of the vessels.
Minimal central enhancement can be seen within the left popliteal
artery.

Runoff: Evaluation is limited due to heavy calcified atheromatous
plaque throughout the trifurcation vessels. The left anterior tibial
artery appears patent at its origin through the level of the ankle.
While there is diffuse atheromatous plaque throughout the posterior
tibial artery, at least a small amount of central enhancement can be
seen through the level of the ankle. Peroneal artery is diminutive,
only opacified in its proximal extent to the level of the proximal
calf.

Veins: No obvious venous abnormality within the limitations of this
arterial phase study.

Review of the MIP images confirms the above findings.

NON-VASCULAR

Lower chest: Trace bilateral pleural effusions. Scattered
emphysematous changes at the lung bases. Patchy consolidation within
the lingula. This could reflect compressive atelectasis given
adjacent cardiomegaly. Diffuse atherosclerosis of the coronary
vessels.

Hepatobiliary: No focal liver abnormality is seen. No gallstones,
gallbladder wall thickening, or biliary dilatation.

Pancreas: Unremarkable. No pancreatic ductal dilatation or
surrounding inflammatory changes.

Spleen: Normal in size without focal abnormality.

Adrenals/Urinary Tract: Bilateral renal cortical atrophy consistent
with history of end-stage renal disease. Mild adrenal thickening
without focal lesion. Bladder is decompressed, limiting its
evaluation.

Stomach/Bowel: No bowel obstruction or ileus. There is diffuse
colonic diverticulosis. Segmental wall thickening of the mid sigmoid
colon is identified, in a region where previous diverticulitis was
identified. Focal contained perforation is seen measuring 3.8 x
cm, interposed between the sigmoid colon and uterus on image 129/6.
Overall, favor sequela of previous bouts of diverticulitis without
evidence of acute inflammatory change. Colonoscopy may be useful to
exclude underlying neoplasm if not recently performed. Normal
appendix right lower quadrant.

Lymphatic: No pathologic adenopathy within the abdomen or pelvis.

Reproductive: Uterus is atrophic.  No adnexal masses.

Other: No free fluid or free intraperitoneal gas. No abdominal wall
hernia.

Musculoskeletal: There are no acute or destructive bony lesions.
Bilateral hip osteoarthritis, right greater than left. No
destructive bony lesions. Reconstructed images demonstrate no
additional findings.
IMPRESSION: VASCULAR

1. Severely limited evaluation of the aortoiliac system and runoff
vessels due to heavily calcified atheromatous plaque.
2. Multifocal segmental occlusions of the bilateral superficial
femoral arteries and popliteal arteries as above.
3. Extensive small vessel disease throughout the runoff vessels
bilaterally, with the anterior and posterior tibial arteries patent
at the level of the ankles.

NON-VASCULAR

1. Segmental wall thickening of the mid sigmoid colon, with evidence
of focal contained perforation, likely as a sequela of previous
diverticulitis. No acute inflammatory changes at this time.
Colonoscopy may be useful to exclude underlying neoplasm.
2. Bilateral renal cortical atrophy consistent with end-stage renal
disease.
3. Aortic Atherosclerosis (77K3S-5EC.C) and Emphysema (77K3S-GF9.5).
4. Cardiomegaly, with likely compressive atelectasis within the
lingula.
